# Patient Record
Sex: Male | Born: 1950 | Race: White | Hispanic: No | Marital: Married | Smoking: Former smoker
Health system: Southern US, Community
[De-identification: ages and names within clinical notes are randomized; demographics above are authoritative.]

## PROBLEM LIST (undated history)

## (undated) ENCOUNTER — Emergency Department (HOSPITAL_BASED_OUTPATIENT_CLINIC_OR_DEPARTMENT_OTHER): Discharge status: Against Medical Advice | Payer: Medicare Other | Source: Home / Self Care

## (undated) DIAGNOSIS — K219 Gastro-esophageal reflux disease without esophagitis: Secondary | ICD-10-CM

## (undated) DIAGNOSIS — J40 Bronchitis, not specified as acute or chronic: Secondary | ICD-10-CM

## (undated) DIAGNOSIS — Z9289 Personal history of other medical treatment: Secondary | ICD-10-CM

## (undated) DIAGNOSIS — D8489 Other immunodeficiencies: Secondary | ICD-10-CM

## (undated) DIAGNOSIS — F32A Depression, unspecified: Secondary | ICD-10-CM

## (undated) DIAGNOSIS — I341 Nonrheumatic mitral (valve) prolapse: Secondary | ICD-10-CM

## (undated) DIAGNOSIS — I200 Unstable angina: Secondary | ICD-10-CM

## (undated) DIAGNOSIS — I499 Cardiac arrhythmia, unspecified: Secondary | ICD-10-CM

## (undated) DIAGNOSIS — K449 Diaphragmatic hernia without obstruction or gangrene: Secondary | ICD-10-CM

## (undated) DIAGNOSIS — Z96649 Presence of unspecified artificial hip joint: Secondary | ICD-10-CM

## (undated) DIAGNOSIS — F419 Anxiety disorder, unspecified: Secondary | ICD-10-CM

## (undated) DIAGNOSIS — J189 Pneumonia, unspecified organism: Secondary | ICD-10-CM

## (undated) DIAGNOSIS — M1990 Unspecified osteoarthritis, unspecified site: Secondary | ICD-10-CM

## (undated) HISTORY — DX: Personal history of other medical treatment: Z92.89

## (undated) HISTORY — DX: Presence of unspecified artificial hip joint: Z96.649

## (undated) HISTORY — DX: Diaphragmatic hernia without obstruction or gangrene: K44.9

## (undated) HISTORY — PX: PARATHYROIDECTOMY: SHX19

## (undated) HISTORY — DX: Other immunodeficiencies: D84.89

## (undated) HISTORY — DX: Unspecified osteoarthritis, unspecified site: M19.90

## (undated) HISTORY — PX: JOINT REPLACEMENT: SHX530

## (undated) HISTORY — DX: Pneumonia, unspecified organism: J18.9

## (undated) HISTORY — DX: Hypercalcemia: E83.52

## (undated) HISTORY — DX: Gastro-esophageal reflux disease without esophagitis: K21.9

## (undated) HISTORY — PX: NASAL SEPTUM SURGERY: SHX37

## (undated) HISTORY — PX: HERNIA REPAIR: SHX51

## (undated) HISTORY — DX: Depression, unspecified: F32.A

## (undated) HISTORY — PX: CATARACT EXTRACTION: SUR2

## (undated) HISTORY — DX: Nonrheumatic mitral (valve) prolapse: I34.1

## (undated) HISTORY — DX: Anxiety disorder, unspecified: F41.9

---

## 1982-07-05 HISTORY — PX: APPENDECTOMY: SHX54

## 1994-07-05 HISTORY — PX: MASTOIDECTOMY: SHX711

## 2000-07-05 HISTORY — PX: CHOLECYSTECTOMY: SHX55

## 2002-07-05 DIAGNOSIS — Z96649 Presence of unspecified artificial hip joint: Secondary | ICD-10-CM

## 2002-07-05 HISTORY — DX: Presence of unspecified artificial hip joint: Z96.649

## 2006-04-18 ENCOUNTER — Encounter: Admission: RE | Admit: 2006-04-18 | Discharge: 2006-07-17 | Payer: Self-pay | Admitting: Emergency Medicine

## 2007-05-08 ENCOUNTER — Encounter: Payer: Self-pay | Admitting: Pulmonary Disease

## 2007-05-24 ENCOUNTER — Inpatient Hospital Stay (HOSPITAL_COMMUNITY): Admission: EM | Admit: 2007-05-24 | Discharge: 2007-05-26 | Payer: Self-pay | Admitting: Internal Medicine

## 2007-05-24 ENCOUNTER — Encounter: Payer: Self-pay | Admitting: Internal Medicine

## 2007-06-07 ENCOUNTER — Ambulatory Visit: Payer: Self-pay | Admitting: Pulmonary Disease

## 2007-07-06 DIAGNOSIS — J189 Pneumonia, unspecified organism: Secondary | ICD-10-CM

## 2007-07-06 HISTORY — DX: Pneumonia, unspecified organism: J18.9

## 2007-07-13 DIAGNOSIS — K219 Gastro-esophageal reflux disease without esophagitis: Secondary | ICD-10-CM | POA: Insufficient documentation

## 2007-07-14 ENCOUNTER — Ambulatory Visit: Payer: Self-pay | Admitting: Pulmonary Disease

## 2007-10-24 ENCOUNTER — Encounter (INDEPENDENT_AMBULATORY_CARE_PROVIDER_SITE_OTHER): Payer: Self-pay | Admitting: *Deleted

## 2007-10-24 ENCOUNTER — Ambulatory Visit: Payer: Self-pay | Admitting: Internal Medicine

## 2007-10-24 DIAGNOSIS — Z9089 Acquired absence of other organs: Secondary | ICD-10-CM

## 2007-10-24 DIAGNOSIS — Z87891 Personal history of nicotine dependence: Secondary | ICD-10-CM | POA: Insufficient documentation

## 2007-10-24 DIAGNOSIS — J189 Pneumonia, unspecified organism: Secondary | ICD-10-CM

## 2007-10-24 DIAGNOSIS — Z87898 Personal history of other specified conditions: Secondary | ICD-10-CM | POA: Insufficient documentation

## 2007-10-24 DIAGNOSIS — F988 Other specified behavioral and emotional disorders with onset usually occurring in childhood and adolescence: Secondary | ICD-10-CM

## 2007-10-24 DIAGNOSIS — E785 Hyperlipidemia, unspecified: Secondary | ICD-10-CM

## 2007-10-24 DIAGNOSIS — F329 Major depressive disorder, single episode, unspecified: Secondary | ICD-10-CM

## 2007-10-24 DIAGNOSIS — Z96649 Presence of unspecified artificial hip joint: Secondary | ICD-10-CM | POA: Insufficient documentation

## 2007-10-24 DIAGNOSIS — J329 Chronic sinusitis, unspecified: Secondary | ICD-10-CM | POA: Insufficient documentation

## 2007-10-24 DIAGNOSIS — D179 Benign lipomatous neoplasm, unspecified: Secondary | ICD-10-CM | POA: Insufficient documentation

## 2007-10-24 DIAGNOSIS — S72009A Fracture of unspecified part of neck of unspecified femur, initial encounter for closed fracture: Secondary | ICD-10-CM | POA: Insufficient documentation

## 2007-10-24 DIAGNOSIS — H6690 Otitis media, unspecified, unspecified ear: Secondary | ICD-10-CM | POA: Insufficient documentation

## 2007-10-24 LAB — CONVERTED CEMR LAB
ALT: 37 units/L (ref 0–53)
AST: 26 units/L (ref 0–37)
Alkaline Phosphatase: 100 units/L (ref 39–117)
BUN: 13 mg/dL (ref 6–23)
Basophils Absolute: 0 10*3/uL (ref 0.0–0.1)
CO2: 26 meq/L (ref 19–32)
Glucose, Bld: 91 mg/dL (ref 70–99)
IgA: 140 mg/dL (ref 68–378)
MCHC: 32.8 g/dL (ref 30.0–36.0)
Monocytes Relative: 9 % (ref 3–12)
Potassium: 4.8 meq/L (ref 3.5–5.3)
RDW: 13.4 % (ref 11.5–15.5)
Total Bilirubin: 0.5 mg/dL (ref 0.3–1.2)
WBC: 5.4 10*3/uL (ref 4.0–10.5)

## 2008-01-02 ENCOUNTER — Ambulatory Visit: Payer: Self-pay | Admitting: Internal Medicine

## 2008-09-12 ENCOUNTER — Emergency Department (HOSPITAL_COMMUNITY): Admission: EM | Admit: 2008-09-12 | Discharge: 2008-09-12 | Payer: Self-pay | Admitting: Family Medicine

## 2008-11-01 ENCOUNTER — Encounter: Admission: RE | Admit: 2008-11-01 | Discharge: 2008-11-01 | Payer: Self-pay | Admitting: Family Medicine

## 2009-05-15 ENCOUNTER — Encounter: Admission: RE | Admit: 2009-05-15 | Discharge: 2009-05-15 | Payer: Self-pay | Admitting: Family Medicine

## 2010-09-21 ENCOUNTER — Emergency Department (HOSPITAL_COMMUNITY)
Admission: EM | Admit: 2010-09-21 | Discharge: 2010-09-21 | Disposition: A | Discharge status: Home / Self Care | Payer: Self-pay | Attending: Emergency Medicine | Admitting: Emergency Medicine

## 2010-09-21 ENCOUNTER — Emergency Department (HOSPITAL_COMMUNITY): Payer: Self-pay

## 2010-09-21 DIAGNOSIS — R51 Headache: Secondary | ICD-10-CM | POA: Insufficient documentation

## 2010-09-21 DIAGNOSIS — F329 Major depressive disorder, single episode, unspecified: Secondary | ICD-10-CM | POA: Insufficient documentation

## 2010-09-21 DIAGNOSIS — Z9889 Other specified postprocedural states: Secondary | ICD-10-CM | POA: Insufficient documentation

## 2010-09-21 DIAGNOSIS — Z79899 Other long term (current) drug therapy: Secondary | ICD-10-CM | POA: Insufficient documentation

## 2010-09-21 DIAGNOSIS — R209 Unspecified disturbances of skin sensation: Secondary | ICD-10-CM | POA: Insufficient documentation

## 2010-09-21 DIAGNOSIS — E789 Disorder of lipoprotein metabolism, unspecified: Secondary | ICD-10-CM | POA: Insufficient documentation

## 2010-09-21 DIAGNOSIS — F3289 Other specified depressive episodes: Secondary | ICD-10-CM | POA: Insufficient documentation

## 2010-09-21 DIAGNOSIS — F988 Other specified behavioral and emotional disorders with onset usually occurring in childhood and adolescence: Secondary | ICD-10-CM | POA: Insufficient documentation

## 2010-09-21 DIAGNOSIS — R42 Dizziness and giddiness: Secondary | ICD-10-CM | POA: Insufficient documentation

## 2010-09-21 DIAGNOSIS — Z8546 Personal history of malignant neoplasm of prostate: Secondary | ICD-10-CM | POA: Insufficient documentation

## 2010-09-21 LAB — COMPREHENSIVE METABOLIC PANEL
AST: 22 U/L (ref 0–37)
Albumin: 4 g/dL (ref 3.5–5.2)
CO2: 28 mEq/L (ref 19–32)
Calcium: 11.4 mg/dL — ABNORMAL HIGH (ref 8.4–10.5)
GFR calc Af Amer: >60 mL/min (ref >60)
GFR calc non Af Amer: >60 mL/min (ref >60)
Glucose, Bld: 91 mg/dL (ref 70–99)
Total Bilirubin: 0.7 mg/dL (ref 0.3–1.2)
Total Protein: 6.3 g/dL (ref 6.0–8.3)

## 2010-09-21 LAB — DIFFERENTIAL
Basophils Relative: 0 % (ref 0–1)
Eosinophils Relative: 1 % (ref 0–5)
Lymphocytes Relative: 22 % (ref 12–46)
Lymphs Abs: 1.4 10*3/uL (ref 0.7–4.0)
Monocytes Absolute: 0.5 10*3/uL (ref 0.1–1.0)
Monocytes Relative: 8 % (ref 3–12)

## 2010-09-21 LAB — PHOSPHORUS: Phosphorus: 2.4 mg/dL (ref 2.3–4.6)

## 2010-09-21 LAB — CBC
MCH: 31.2 pg (ref 26.0–34.0)
RBC: 4.68 MIL/uL (ref 4.22–5.81)

## 2010-10-15 LAB — PROTIME-INR
INR: 1 (ref 0.00–1.49)
Prothrombin Time: 12.9 seconds (ref 11.6–15.2)

## 2010-10-15 LAB — POCT URINALYSIS DIP (DEVICE)
Ketones, ur: NEGATIVE mg/dL
Nitrite: NEGATIVE
Protein, ur: 100 mg/dL — AB

## 2010-10-15 LAB — DIFFERENTIAL
Basophils Absolute: 0 10*3/uL (ref 0.0–0.1)
Eosinophils Absolute: 0 10*3/uL (ref 0.0–0.7)
Eosinophils Relative: 0 % (ref 0–5)
Lymphs Abs: 0.5 10*3/uL — ABNORMAL LOW (ref 0.7–4.0)
Monocytes Absolute: 0.2 10*3/uL (ref 0.1–1.0)
Monocytes Relative: 4 % (ref 3–12)
Neutro Abs: 6.3 10*3/uL (ref 1.7–7.7)
Neutrophils Relative %: 89 % — ABNORMAL HIGH (ref 43–77)

## 2010-10-15 LAB — CBC
HCT: 45.6 % (ref 39.0–52.0)
Platelets: 158 10*3/uL (ref 150–400)

## 2010-10-15 LAB — POCT I-STAT, CHEM 8
Calcium, Ion: 1.56 mmol/L — ABNORMAL HIGH (ref 1.12–1.32)
TCO2: 28 mmol/L (ref 0–100)

## 2010-10-15 LAB — COMPREHENSIVE METABOLIC PANEL
ALT: 38 U/L (ref 0–53)
Alkaline Phosphatase: 144 U/L — ABNORMAL HIGH (ref 39–117)
CO2: 30 mEq/L (ref 19–32)
GFR calc non Af Amer: >60 mL/min (ref >60)
Potassium: 4.7 mEq/L (ref 3.5–5.1)
Total Bilirubin: 0.9 mg/dL (ref 0.3–1.2)

## 2010-11-11 ENCOUNTER — Other Ambulatory Visit (HOSPITAL_COMMUNITY): Payer: Self-pay | Admitting: Endocrinology

## 2010-11-11 DIAGNOSIS — E213 Hyperparathyroidism, unspecified: Secondary | ICD-10-CM

## 2010-11-13 ENCOUNTER — Ambulatory Visit (HOSPITAL_COMMUNITY): Payer: Self-pay

## 2010-11-13 ENCOUNTER — Ambulatory Visit (HOSPITAL_COMMUNITY)
Admission: RE | Admit: 2010-11-13 | Discharge: 2010-11-13 | Disposition: A | Discharge status: Home / Self Care | Payer: PRIVATE HEALTH INSURANCE | Source: Ambulatory Visit | Attending: Endocrinology | Admitting: Endocrinology

## 2010-11-13 ENCOUNTER — Ambulatory Visit (HOSPITAL_COMMUNITY): Admission: RE | Admit: 2010-11-13 | Payer: Self-pay | Source: Ambulatory Visit

## 2010-11-13 DIAGNOSIS — E213 Hyperparathyroidism, unspecified: Secondary | ICD-10-CM

## 2010-11-13 MED ORDER — TECHNETIUM TC 99M SESTAMIBI - CARDIOLITE
25.0000 | Freq: Once | INTRAVENOUS | Status: AC | PRN
  Administered 2010-11-13: 25 via INTRAVENOUS

## 2010-11-17 NOTE — H&P (Signed)
NAMELORENZ, [name]               ACCOUNT NO.:  [id number]   MEDICAL RECORD NO.:  [id number]          PATIENT TYPE:  INP   LOCATION:  1318                         FACILITY:  [facility]   PHYSICIAN:  [name], M.D.DATE OF BIRTH:  Nov 04, 1950   DATE OF ADMISSION:  05/24/2007  DATE OF DISCHARGE:                              HISTORY & PHYSICAL   PRIMARY CARE PHYSICIAN:  [name], M.D.   CHIEF COMPLAINT:  Worsening difficulty breathing per primary care  physician, pneumonia, failed outpatient treatment.   HISTORY OF PRESENT ILLNESS:  The patient is a 60 year old white male,  former smoker, with history of pneumonia in the past, also history of  hypercholesterolemia and depression who presents with the above  complaints.  He states that about 11 days ago he developed some URI  symptoms (caught a cold from his grandson.  He has a nonproductive cough  associated which did not improve with over-the-counter cold medication.  He states that he also began having subjective fevers and generalized  malaise/weakness and so six days ago, he saw his primary care physician.  Per Dr. Laurine Blazer, an x-ray was done which was consistent with pneumonia  and the patient was given IM Rocephin and started on oral Levaquin.  [name] states that the cough continued to worsen, still nonproductive  for the most part, but very frequent and keeps him up at night.  Also  his energy level has continued to decrease and he also developed  difficulty breathing.  He went back to see respiratory care on the day  prior to admission.  Per Dr. Laurine Blazer, his clinical exam sounded worse  than on his previous visit and at that time his oxygen saturation on  room air where adequate, in the 90s, she states that she spoke with the  patient about getting admitted but they did not want to come in at that  time.  He was given nebulized bronchodilators and he went home.  Today  the patient work up feeling worse and with  worsening difficulty and  breathing and so was directly admitted to the hospital for further  evaluation and management.  Per Dr. Laurine Blazer, the patient also received a  second dose of IM Rocephin on the day prior to admission when he decided  then he was not ready to be admitted.   The patient denies chest pain, nausea and vomiting, hemoptysis, melena  and, no hematochezia.  He also denies leg swelling.  He admits to some  loose stools since he was discharged on antibiotics.   PAST MEDICAL HISTORY:  1. As above.  2. Question ADHD.  3. GERD/Barrett's.  4. Status post total hip replacement in 2004.   MEDICATIONS:  1. Zocor 40 mg p.o. at bedtime.  2. Prozac 40 mg daily.  3. Prilosec 40 mg daily.  4. Ritalin 10 mg p.o. t.i.d.  The patient states that he was      prescribed this medication but he does not take the Ritalin.  5. Prednisone.  Started on the day prior to admission for one dose.   SOCIAL HISTORY:  He quit tobacco 22 years ago.  Rare alcohol.   FAMILY HISTORY:  His father had an MI in his 4s and he died at age 25  of lung cancer (he was a smoker). His mother had hypertension.   REVIEW OF SYSTEMS:  As per HPI.  Other review of systems negative.   PHYSICAL EXAMINATION:  GENERAL:  The patient is middle-aged white male.  He is acutely ill-appearing, in respiratory distress.  Well-developed,  well-nourished.  VITAL SIGNS:  Oxygen saturation is 97% on room air.  Respiratory rate is  25, blood pressure 116/69, pulse 72, temperature 98.5.  HEENT:  PERRL.  EOMI.  Sclerae nonicteric.  Slightly dry mucous  membranes.  NECK:  Supple.  No adenopathy, no thyromegaly.  No JVD.  LUNGS:  Left-sided rhonchi,  moderate air movement, no wheezes.  CARDIOVASCULAR:  Regular rate and rhythm.  Normal S1 and S2.  No S3  appreciated.  ABDOMEN:  Soft.  Bowel sounds present.  Nontender.  Nondistended.  No  organomegaly, no masses palpable.  EXTREMITIES:  No cyanosis and no edema.   LABORATORY  DATA:  The CBC, chemistries and chest x-ray are pending.   ASSESSMENT/PLAN:  Problem #1.  Pneumonia.  Review of chest x-ray, CBC,  EKG ordered, empiric IV antibiotics.  The patient filled outpatient  antibiotics as discussed above.  Will obtain blood cultures if her  temperature is equal to or greater than 100.4.  Problem #2.  History of depression.  Continue Prozac.  Problem #3. History of hypercholesterolemia.  Continue Zocor.      [name], M.D.  Electronically Signed     ACV/MEDQ  D:  05/24/2007  T:  05/24/2007  Job:  [id number]   cc:   Dr. Johnn Gomez

## 2010-11-17 NOTE — Assessment & Plan Note (Signed)
[facility]                             PULMONARY OFFICE NOTE   NAME:[name], [name]                        MRN:          [id number]  DATE:06/07/2007                            DOB:          [birth year]    [name] is a pleasant 60 year old Caucasian gentleman who is  recovering from a pneumonia.  He reports catching a cold from his  daughter a few weeks ago and developing dyspnea, cough, and fever.  He  had left basilar crackles and expiratory wheezes on May 18, 2007  and received 1 gm of Rocephin in the office.  He was no better in five  days' time.  The chest x-ray showed a left lower lobe infiltrate on  November 13.  He was hospitalized due to this finding and treated with  antibiotics.  He was told that he may have COPD, and this is his main  concern today.  He reports that his energy level has improved but is  only at about 20% of baseline and describes bouts of coughing, mostly  dry but sometimes with minimal white sputum.  He denies wheezing, chest  pain.  He remains dyspneic on minimal exertion.   PAST MEDICAL HISTORY:  1. ADHD.  2. Hiatal hernia.  3. GERD.  4. Barrett's esophagus.  5. Mitral valve prolapse with heart rhythm problems.   PAST SURGICAL HISTORY:  1. Cholecystectomy six years ago.  2. Appendectomy 25 years ago.  3. Mastoidectomy 12 years ago.  4. Total hip replacement in 2004.  5. Screening endoscopy every two years.   ALLERGIES:  None.   SOCIAL HISTORY:  He smoked about a pack and a half per day for 20 years  but quit about 18 years ago.  He is married and lives with his wife.  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   FAMILY HISTORY:  His father died of lung cancer.  Heart disease in  father and mother.   REVIEW OF SYSTEMS:  He reports cough, as above.  Occasional acid,  heartburn, sticking pain in his chest.  A sore throat with occasional  headaches and nasal congestion.  Multiple lipomas all over his body.   PHYSICAL EXAMINATION:  Height 6 feet 1 inch.  Weight 192.6 pounds.  Blood pressure 110/66, temperature 97.8, heart rate 84, oxygen  saturation 96% on room air.  HEENT:  No postnasal drip.  NECK:  Supple.  No JVD.  No lymphadenopathy.  CVS:  S1 and S2.  Normal.  CHEST:  Clear to auscultation.  ABDOMEN:  Soft and nontender.  NEUROLOGIC:  Nonfocal.  EXTREMITIES:  No edema.   CURRENT MEDICATIONS:  1. Prozac 40 mg daily.  2. Zocor 40 mg daily.  3. Prilosec 40 mg daily.  4. Maalox Triple Strength 3 times daily.  5. Ritalin 10 mg 3 tablets daily.  6. Symbicort 160/4.5 2 puffs b.i.d.  7. Combivent 2 puffs b.i.d. p.r.n.  8. Mucinex 2 tablets daily.  9. Albuterol nebs 1-2 daily.   Chest x-ray from  May 24, 2007 shows accentuated peribronchial  markings.  No evidence of pneumonia.  A linear density in the left  retrocardiac region can be sub-segmental atelectasis.   IMPRESSION:  1. Recurring left lower lobe pneumonia.  2. Post pneumonic cough.  3. Question underlying chronic obstructive pulmonary disease in this      ex-smoker.  4. Multiple subcutaneous lipomas.   RECOMMENDATIONS:  1. I have reassured [name] that what he has is likely a post-      bronchitic cough and may last up to eight weeks but most likely      will pass.  Recommended treatment for this is inhaled and/or      systemic steroids.  Given his problems with hiatal hernia and GERD,      we will probably stick to inhaled steroids.  As such, Symbicort      will be continued at the current dose.  He will take Robitussin-DM      for symptomatic relief.  Tussionex apparently has caused problems      with somnolence.  2. He will return for a followup in about six weeks time when lung      function will be estimated by spirometry.  Further recommendations      will followed based on that test.  3. I do not feel the need for further antibiotics.   I would also      suggest good treatment of his reflux symptoms so that we do not      transition from this into a chronic cough.     Oretha Milch, MD  Electronically Signed    RVA/MedQ  DD: 06/07/2007  DT: 06/07/2007  Job #: [id number]   cc:   Emeterio Reeve, MD

## 2010-12-21 ENCOUNTER — Other Ambulatory Visit (INDEPENDENT_AMBULATORY_CARE_PROVIDER_SITE_OTHER): Payer: Self-pay | Admitting: General Surgery

## 2011-01-01 ENCOUNTER — Other Ambulatory Visit (INDEPENDENT_AMBULATORY_CARE_PROVIDER_SITE_OTHER): Payer: Self-pay | Admitting: General Surgery

## 2011-01-18 ENCOUNTER — Encounter (HOSPITAL_COMMUNITY)
Admission: RE | Admit: 2011-01-18 | Discharge: 2011-01-18 | Disposition: A | Discharge status: Home / Self Care | Payer: PRIVATE HEALTH INSURANCE | Source: Ambulatory Visit | Attending: General Surgery | Admitting: General Surgery

## 2011-01-18 ENCOUNTER — Other Ambulatory Visit (HOSPITAL_COMMUNITY): Payer: PRIVATE HEALTH INSURANCE

## 2011-01-18 LAB — CBC
HCT: 44.9 % (ref 39.0–52.0)
Hemoglobin: 16.1 g/dL (ref 13.0–17.0)
MCH: 31.9 pg (ref 26.0–34.0)
MCV: 89.1 fL (ref 78.0–100.0)

## 2011-01-20 ENCOUNTER — Other Ambulatory Visit (INDEPENDENT_AMBULATORY_CARE_PROVIDER_SITE_OTHER): Payer: Self-pay | Admitting: General Surgery

## 2011-01-20 ENCOUNTER — Other Ambulatory Visit (HOSPITAL_COMMUNITY): Payer: PRIVATE HEALTH INSURANCE

## 2011-01-20 ENCOUNTER — Ambulatory Visit (HOSPITAL_COMMUNITY)
Admission: RE | Admit: 2011-01-20 | Discharge: 2011-01-20 | Disposition: A | Discharge status: Home / Self Care | Payer: PRIVATE HEALTH INSURANCE | Source: Ambulatory Visit | Attending: General Surgery | Admitting: General Surgery

## 2011-01-20 DIAGNOSIS — E210 Primary hyperparathyroidism: Secondary | ICD-10-CM

## 2011-01-20 DIAGNOSIS — I059 Rheumatic mitral valve disease, unspecified: Secondary | ICD-10-CM | POA: Insufficient documentation

## 2011-01-20 DIAGNOSIS — F909 Attention-deficit hyperactivity disorder, unspecified type: Secondary | ICD-10-CM | POA: Insufficient documentation

## 2011-01-20 DIAGNOSIS — J449 Chronic obstructive pulmonary disease, unspecified: Secondary | ICD-10-CM | POA: Insufficient documentation

## 2011-01-20 DIAGNOSIS — J4489 Other specified chronic obstructive pulmonary disease: Secondary | ICD-10-CM | POA: Insufficient documentation

## 2011-01-20 DIAGNOSIS — D1739 Benign lipomatous neoplasm of skin and subcutaneous tissue of other sites: Secondary | ICD-10-CM

## 2011-01-20 DIAGNOSIS — D351 Benign neoplasm of parathyroid gland: Secondary | ICD-10-CM

## 2011-01-20 DIAGNOSIS — Z01812 Encounter for preprocedural laboratory examination: Secondary | ICD-10-CM | POA: Insufficient documentation

## 2011-01-20 MED ORDER — TECHNETIUM TC 99M SESTAMIBI - CARDIOLITE
25.0000 | Freq: Once | INTRAVENOUS | Status: AC | PRN
  Administered 2011-01-20: 11:00:00 25 via INTRAVENOUS

## 2011-01-21 NOTE — Op Note (Signed)
[name], [name]               ACCOUNT NO.:  [id number]  MEDICAL RECORD NO.:  [id number]  LOCATION:                                 FACILITY:  PHYSICIAN:  Almond Lint, MD       DATE OF BIRTH:  [birth year]  DATE OF PROCEDURE:  01/20/2011 DATE OF DISCHARGE:                              OPERATIVE REPORT   PREOPERATIVE DIAGNOSES: 1. Primary hyperparathyroidism. 2. Left forearm masses.  POSTOPERATIVE DIAGNOSES: 1. Primary hyperparathyroidism. 2. Left forearm masses.  PROCEDURE:  Minimally invasive parathyroidectomy and excision of left forearm mass was x3.  Mass #1 around 1.5 cm, mass #2 around 1.8 cm, and mass #3 around 3 cm.  SURGEON:  Almond Lint, MD  ASSISTANT:  Ollen Gross. Vernell Morgans, MD  ANESTHESIA:  General and local.  FINDINGS:  Large left inferior parathyroid and 3 arm lipomas.  SPECIMEN:  Parathyroid for frozen section demonstrating a 3.5-gram adenoma and 3 left forearm masses to pathology.  Of note, the #1 is the most distal and #3 is the most proximal.  ESTIMATED BLOOD LOSS:  Minimal.  COMPLICATIONS:  None known.  PROCEDURE:  [name] was identified in the holding area and taken to operating room where he was placed supine on the operating table. General anesthesia was induced.  His arms were tucked and his upper chest and neck were prepped and draped in sterile fashion.  A shoulder roll was placed and his neck was extended.  The incision was made approximately 1.5 cm above the sternal notch.  This was approximately 4 cm in length.  The skin was incised with a #15 blade and the subcutaneous tissues and the platysma were divided with cautery.  The strap muscles were opened in the midline and retracted toward the left with the [employer].  Tonsil was used to separate the alveolar adhesions from the anterior surface of the thyroid.  The parathyroid was immediately seen as it was quite large.  The attachments of this were divided bluntly and then the vessels  going into it were clipped and divided with the Metzenbaum scissors.  The mass was enclosed in a fatty layer.  This was tested with NeoProbe and it was hot.  It was sent for frozen section.  Hemostasis was achieved with the cautery.  The nerve was seen and was undamaged.  The Seprafilm was placed underneath anterior to the thyroid.  The strap muscles were reapproximated with a running 3-0 Vicryl.  The skin was pulled back together with interrupted 3-0 Vicryl to the platysma and the deep dermis and 4-0 Monocryl running subcuticular stitch.  This was then cleaned, dried, and dressed with Benzoin and Steri-Strips.  The frozen section to come back while we were closing and this was positive for an adenoma, so the wound was dressed.   Left forearm was then pulled out where it was tucked and this was clipped, prepped, and draped in a sterile fashion.  Each of these forearm masses with the skin overlying this was infiltrated with local. Longitudinal incisions were made over the masses.  The most dorsal mass was approached first.  The skin was incised and the lipoma was immediately visualized.  This  was taken off the surrounding tissues with cautery and with Metzenbaum scissors.  This was passed off the table. The most distal one was done next.  Again, a longitudinal incision made overlying it and this was dissected from the surrounding tissue with hemostat.  The largest one which was the most proximal was approached last and again, this was incised #15 blade over the mass.  This was taken off the attachments bluntly, with cautery, and with Metzenbaum scissors.  There were two other small lipomas palpable in the arm from this incision and so these were taken as well with the hemostat.  Hemostasis was achieved in all incisions with cautery.  These were irrigated.  The wounds were then all closed with interrupted 3-0 Vicryl deep dermal sutures and 4-0 Monocryl running subcuticular suture.  The  incisions were cleaned, dried, and dressed with Dermabond.  The patient was awakened from anesthesia and taken to PACU in stable condition.  Needle, sponge, and instrument counts were correct.     Almond Lint, MD     FB/MEDQ  D:  01/20/2011  T:  01/21/2011  Job:  [id number]  Electronically Signed by Almond Lint MD on 01/21/2011 11:17:00 AM

## 2011-01-24 ENCOUNTER — Emergency Department (HOSPITAL_COMMUNITY)
Admission: EM | Admit: 2011-01-24 | Discharge: 2011-01-24 | Disposition: A | Discharge status: Home / Self Care | Payer: PRIVATE HEALTH INSURANCE | Attending: Emergency Medicine | Admitting: Emergency Medicine

## 2011-01-24 DIAGNOSIS — Z79899 Other long term (current) drug therapy: Secondary | ICD-10-CM | POA: Insufficient documentation

## 2011-01-24 DIAGNOSIS — Z87891 Personal history of nicotine dependence: Secondary | ICD-10-CM | POA: Insufficient documentation

## 2011-01-24 DIAGNOSIS — Z9889 Other specified postprocedural states: Secondary | ICD-10-CM | POA: Insufficient documentation

## 2011-01-24 DIAGNOSIS — E7800 Pure hypercholesterolemia, unspecified: Secondary | ICD-10-CM | POA: Insufficient documentation

## 2011-01-24 DIAGNOSIS — F988 Other specified behavioral and emotional disorders with onset usually occurring in childhood and adolescence: Secondary | ICD-10-CM | POA: Insufficient documentation

## 2011-01-24 DIAGNOSIS — F411 Generalized anxiety disorder: Secondary | ICD-10-CM | POA: Insufficient documentation

## 2011-01-24 LAB — BASIC METABOLIC PANEL
BUN: 16 mg/dL (ref 6–23)
CO2: 29 mEq/L (ref 19–32)
Calcium: 10.2 mg/dL (ref 8.4–10.5)
Creatinine, Ser: 1.03 mg/dL (ref 0.50–1.35)
GFR calc non Af Amer: >60 mL/min (ref >60)
Glucose, Bld: 100 mg/dL — ABNORMAL HIGH (ref 70–99)

## 2011-02-18 ENCOUNTER — Encounter (INDEPENDENT_AMBULATORY_CARE_PROVIDER_SITE_OTHER): Payer: Self-pay | Admitting: General Surgery

## 2011-02-19 ENCOUNTER — Encounter (INDEPENDENT_AMBULATORY_CARE_PROVIDER_SITE_OTHER): Payer: Self-pay | Admitting: General Surgery

## 2011-02-19 ENCOUNTER — Ambulatory Visit (INDEPENDENT_AMBULATORY_CARE_PROVIDER_SITE_OTHER): Payer: PRIVATE HEALTH INSURANCE | Admitting: General Surgery

## 2011-02-19 DIAGNOSIS — E210 Primary hyperparathyroidism: Secondary | ICD-10-CM

## 2011-02-19 NOTE — Assessment & Plan Note (Signed)
Doing well post op. Still with some fatigue. Follow up labs. Will see prn if labs are abnormal

## 2011-02-19 NOTE — Progress Notes (Signed)
HPI Pt doing OK after minimally invasive parathyroidectomy.  He was very fatigued for a few weeks, but that is getting better.  He was having difficulty sleeping.  He does occasionally feel some numbness around his mouth and some hints of cramps in his feet and hands.  He has not had overt cramping.  He has been taking his tums.  He also had excision of 3 lipomas on his L forearm.  He feels some soreness still on the ones over the brachioradialis, but the one on the flexor surface has been non tender.  He has had no signs of infection or fevers/chills.    Physical Exam: Alert and oriented times 3.  No acute distress. Neck is supple, no lymphadenopathy.  Incision is well healed.  There is no erythema or drainage. L forearm has incisions in appropriate stage of healing.  He has no signs of infection.  Hyperparathyroidism, primary, s/p MI parathyroidectomy 7/18, 3.1 gm adenoma Doing well post op. Still with some fatigue. Follow up labs. Will see prn if labs are abnormal

## 2011-02-22 NOTE — Progress Notes (Signed)
Quick Note:  Labs OK, does not need to increase calcium. Please get follow up labs in 6-8 weeks PTH/Ca ______

## 2011-02-24 ENCOUNTER — Telehealth (INDEPENDENT_AMBULATORY_CARE_PROVIDER_SITE_OTHER): Payer: Self-pay | Admitting: General Surgery

## 2011-04-13 LAB — CULTURE, BLOOD (ROUTINE X 2): Culture: NO GROWTH

## 2011-04-13 LAB — COMPREHENSIVE METABOLIC PANEL
Albumin: 3.3 — ABNORMAL LOW
BUN: 13
Calcium: 9.7
Creatinine, Ser: 1.09
Glucose, Bld: 97
Potassium: 4.3
Total Protein: 5.6 — ABNORMAL LOW

## 2011-04-13 LAB — URINALYSIS, ROUTINE W REFLEX MICROSCOPIC
Hgb urine dipstick: NEGATIVE
Ketones, ur: NEGATIVE
Protein, ur: NEGATIVE
Urobilinogen, UA: 0.2

## 2011-04-13 LAB — DIFFERENTIAL
Basophils Relative: 1
Lymphocytes Relative: 19
Lymphs Abs: 1.1
Monocytes Absolute: 0.4
Monocytes Relative: 7
Neutro Abs: 4.3
Neutrophils Relative %: 70

## 2011-04-13 LAB — CBC
HCT: 37 — ABNORMAL LOW
HCT: 40.3
Hemoglobin: 13.2
Hemoglobin: 13.9
MCHC: 34.4
MCV: 88.9
Platelets: 238
RDW: 12.3
RDW: 12.3

## 2011-04-13 LAB — BASIC METABOLIC PANEL
BUN: 11
CO2: 28
Chloride: 105
GFR calc non Af Amer: >60
Glucose, Bld: 90
Potassium: 4
Sodium: 138

## 2011-04-13 LAB — CARDIAC PANEL(CRET KIN+CKTOT+MB+TROPI)
Relative Index: 0.6
Troponin I: <0.01

## 2011-04-13 LAB — URINE CULTURE
Colony Count: NO GROWTH
Culture: NO GROWTH
Special Requests: POSITIVE

## 2011-05-31 ENCOUNTER — Encounter (HOSPITAL_COMMUNITY): Payer: Self-pay | Admitting: Pharmacy Technician

## 2011-06-01 ENCOUNTER — Other Ambulatory Visit: Payer: Self-pay | Admitting: Orthopedic Surgery

## 2011-06-04 ENCOUNTER — Inpatient Hospital Stay (HOSPITAL_COMMUNITY): Admission: RE | Admit: 2011-06-04 | Discharge: 2011-06-04 | Payer: PRIVATE HEALTH INSURANCE | Source: Ambulatory Visit

## 2011-06-04 NOTE — Pre-Procedure Instructions (Signed)
20 CELESTINO ACKERMAN  06/04/2011   Your procedure is scheduled on: Mon,Dec 10@ 1025  Report to [facility] Short Stay Center at 0825 AM.  Call this number if you have problems the morning of surgery: [phone]   Remember:   Do not eat food:After Midnight.  May have clear liquids: up to 4 Hours before arrival(.nothing after 4:25am)  Clear liquids include soda, tea, black coffee, apple or grape juice, broth.  Take these medicines the morning of surgery with A SIP OF WATER: Prozac,Ritalin,Omeprazole   Do not wear jewelry, make-up or nail polish.  Do not wear lotions, powders, or perfumes. You may wear deodorant.  Do not shave 48 hours prior to surgery.  Do not bring valuables to the hospital.  Contacts, dentures or bridgework may not be worn into surgery.  Leave suitcase in the car. After surgery it may be brought to your room.  For patients admitted to the hospital, checkout time is 11:00 AM the day of discharge.   Patients discharged the day of surgery will not be allowed to drive home.  Name and phone number of your driver:   Special Instructions: CHG Shower Use Special Wash: 1/2 bottle night before surgery and 1/2 bottle morning of surgery.   Please read over the following fact sheets that you were given: Pain Booklet, Coughing and Deep Breathing, Blood Transfusion Information, MRSA Information and Surgical Site Infection Prevention

## 2011-06-08 ENCOUNTER — Encounter (HOSPITAL_COMMUNITY): Payer: Self-pay

## 2011-06-08 ENCOUNTER — Encounter (HOSPITAL_COMMUNITY)
Admission: RE | Admit: 2011-06-08 | Discharge: 2011-06-08 | Disposition: A | Discharge status: Home / Self Care | Payer: PRIVATE HEALTH INSURANCE | Source: Ambulatory Visit | Attending: Orthopedic Surgery | Admitting: Orthopedic Surgery

## 2011-06-08 HISTORY — DX: Cardiac arrhythmia, unspecified: I49.9

## 2011-06-08 HISTORY — DX: Bronchitis, not specified as acute or chronic: J40

## 2011-06-08 LAB — CBC
HCT: 47.1 % (ref 39.0–52.0)
Hemoglobin: 16.1 g/dL (ref 13.0–17.0)
MCH: 31.2 pg (ref 26.0–34.0)
MCV: 91.3 fL (ref 78.0–100.0)
RBC: 5.16 MIL/uL (ref 4.22–5.81)

## 2011-06-08 LAB — PROTIME-INR: INR: 1 (ref 0.00–1.49)

## 2011-06-08 LAB — DIFFERENTIAL
Eosinophils Absolute: 0.2 10*3/uL (ref 0.0–0.7)
Lymphs Abs: 1.5 10*3/uL (ref 0.7–4.0)
Monocytes Absolute: 0.5 10*3/uL (ref 0.1–1.0)
Monocytes Relative: 9 % (ref 3–12)
Neutrophils Relative %: 62 % (ref 43–77)

## 2011-06-08 LAB — URINALYSIS, ROUTINE W REFLEX MICROSCOPIC
Ketones, ur: NEGATIVE mg/dL
Leukocytes, UA: NEGATIVE
Nitrite: NEGATIVE
Protein, ur: NEGATIVE mg/dL
pH: 6 (ref 5.0–8.0)

## 2011-06-08 LAB — BASIC METABOLIC PANEL
CO2: 28 mEq/L (ref 19–32)
Chloride: 104 mEq/L (ref 96–112)
Glucose, Bld: 99 mg/dL (ref 70–99)
Potassium: 4.6 mEq/L (ref 3.5–5.1)
Sodium: 141 mEq/L (ref 135–145)

## 2011-06-08 LAB — ABO/RH: ABO/RH(D): AB POS

## 2011-06-08 LAB — TYPE AND SCREEN

## 2011-06-08 NOTE — Progress Notes (Signed)
Contacted Dr. Wadie Lessen office [phone], left voicemail for Olegario Messier to verify EKG from July 2012 ok to use prior to surgery.

## 2011-06-08 NOTE — Progress Notes (Signed)
Ok to use recent EKG per karen with dr. Wadie Lessen office.

## 2011-06-08 NOTE — Pre-Procedure Instructions (Signed)
20 [name]  06/08/2011   Your procedure is scheduled on:  Mon. June 14, 2011  Report to [facility] Short Stay Center at 0825am AM.  Call this number if you have problems the morning of surgery: [phone]   Remember:   Do not eat food:After Midnight.  May have clear liquids: up to 4 Hours before arrival.(nothing after 4:24am)  Clear liquids include soda, tea, black coffee, apple or grape juice, broth.  Take these medicines the morning of surgery with A SIP OF WATER: prozac, ritalin, omeprazole   Do not wear jewelry, make-up or nail polish.  Do not wear lotions, powders, or perfumes. You may wear deodorant.  Do not shave 48 hours prior to surgery.  Do not bring valuables to the hospital.  Contacts, dentures or bridgework may not be worn into surgery.  Leave suitcase in the car. After surgery it may be brought to your room.  For patients admitted to the hospital, checkout time is 11:00 AM the day of discharge.   Patients discharged the day of surgery will not be allowed to drive home.  Name and phone number of your driver: Askia Hazelip [phone]  Special Instructions: CHG Shower Use Special Wash: 1/2 bottle night before surgery and 1/2 bottle morning of surgery.   Please read over the following fact sheets that you were given: Pain Booklet, Coughing and Deep Breathing, Blood Transfusion Information, MRSA Information and Surgical Site Infection Prevention

## 2011-06-10 NOTE — H&P (Signed)
HISTORY OF PRESENT ILLNESS:  A 60 year old gentleman here for progressive left hip pain.  In 2003 he had what sounds like a femoral neck fracture they attempted to pin, that failed and they converted him to a bipolar implant.  Over the last 3 years he has had progressive groin pain and limp.  It is associated with standing or shooting pool for more than an hour.  It never occurs at rest or when he is seated.  No problems with the wound.  No fevers, no chills.  When he has the pain it is severe, sharp, stabbing and it feels like somebody has stuck an ice pick in his groin.  Up until a few years ago he does not report any of these issues.  Review of systems reviewed thoroughly.  No prior problems with the hip before the fracture in 2003.  He is not a diabetic, no elevated cholesterol.  He has had cataract surgery in 2010.  Has had some insomnia since 2012.  Does not take any blood thinners.  He does not smoke, does not use alcohol.  He is married, he is here with his wife. He lives with 4 other people.  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  All other systems are negative as related to the chief complaint.  OBJECTIVE:  Internal rotation of his left hip reproduces minimal pain.  He has a very mild left-sided limp and reports right now it is not hurting that much.  His surgical wound is well healed.  Plain radiographs show what appears to be a cemented, beaded Omnifit stem from [employer] with a bipolar head. The bipolar head is pretty much down to bone-on-bone superiorly and medially. Normal pulses to the foot. Normal sensation to the foot.  Generally he is a well-nourished, well-developed 60 year old gentleman seated on the exam table.  No apparent distress.  No shortness of breath.  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  PLAN:  To confirm this, I would like to have him seen by Dr. Regino Schultze for an anesthetic  injection in a few hours, after he has a chance to get it aggravated.  If that does take his pain away, I would agree with him and that it should be converted to a total hip.  We will discuss this further after he has the injection.

## 2011-06-13 ENCOUNTER — Encounter (HOSPITAL_COMMUNITY): Payer: Self-pay | Admitting: Orthopedic Surgery

## 2011-06-13 DIAGNOSIS — T8484XA Pain due to internal orthopedic prosthetic devices, implants and grafts, initial encounter: Secondary | ICD-10-CM

## 2011-06-13 MED ORDER — CHLORHEXIDINE GLUCONATE 4 % EX LIQD: 60.0000 mL | Freq: Once | CUTANEOUS | Status: DC

## 2011-06-13 MED ORDER — CEFAZOLIN SODIUM 1-5 GM-% IV SOLN: 1.0000 g | INTRAVENOUS | Status: DC

## 2011-06-14 ENCOUNTER — Encounter (HOSPITAL_COMMUNITY)
Admission: RE | Disposition: A | Discharge status: Skilled Nursing Facility | Payer: Self-pay | Source: Ambulatory Visit | Attending: Orthopedic Surgery

## 2011-06-14 ENCOUNTER — Encounter (HOSPITAL_COMMUNITY): Payer: Self-pay | Admitting: Anesthesiology

## 2011-06-14 ENCOUNTER — Inpatient Hospital Stay (HOSPITAL_COMMUNITY): Payer: PRIVATE HEALTH INSURANCE

## 2011-06-14 ENCOUNTER — Encounter (HOSPITAL_COMMUNITY): Payer: Self-pay | Admitting: *Deleted

## 2011-06-14 ENCOUNTER — Inpatient Hospital Stay (HOSPITAL_COMMUNITY)
Admission: RE | Admit: 2011-06-14 | Discharge: 2011-06-18 | DRG: 468 | Disposition: A | Discharge status: Skilled Nursing Facility | Payer: PRIVATE HEALTH INSURANCE | Source: Ambulatory Visit | Attending: Orthopedic Surgery | Admitting: Orthopedic Surgery

## 2011-06-14 ENCOUNTER — Inpatient Hospital Stay (HOSPITAL_COMMUNITY): Payer: PRIVATE HEALTH INSURANCE | Admitting: Anesthesiology

## 2011-06-14 DIAGNOSIS — T8484XA Pain due to internal orthopedic prosthetic devices, implants and grafts, initial encounter: Secondary | ICD-10-CM

## 2011-06-14 DIAGNOSIS — T8489XA Other specified complication of internal orthopedic prosthetic devices, implants and grafts, initial encounter: Principal | ICD-10-CM | POA: Diagnosis present

## 2011-06-14 DIAGNOSIS — K219 Gastro-esophageal reflux disease without esophagitis: Secondary | ICD-10-CM | POA: Diagnosis present

## 2011-06-14 DIAGNOSIS — Z96649 Presence of unspecified artificial hip joint: Secondary | ICD-10-CM

## 2011-06-14 DIAGNOSIS — Z01812 Encounter for preprocedural laboratory examination: Secondary | ICD-10-CM

## 2011-06-14 DIAGNOSIS — K449 Diaphragmatic hernia without obstruction or gangrene: Secondary | ICD-10-CM | POA: Diagnosis present

## 2011-06-14 HISTORY — PX: TOTAL HIP REVISION: SHX763

## 2011-06-14 SURGERY — TOTAL HIP REVISION
Anesthesia: General | Site: Hip | Laterality: Left | Wound class: Clean

## 2011-06-14 MED ORDER — SODIUM CHLORIDE 0.9 % IR SOLN
Status: DC | PRN
  Administered 2011-06-14: 1000 mL

## 2011-06-14 MED ORDER — CEFAZOLIN SODIUM-DEXTROSE 2-3 GM-% IV SOLR
INTRAVENOUS | Status: AC
  Administered 2011-06-14: 2 g via INTRAVENOUS
  Filled 2011-06-14: qty 50

## 2011-06-14 MED ORDER — ROCURONIUM BROMIDE 100 MG/10ML IV SOLN
INTRAVENOUS | Status: DC | PRN
  Administered 2011-06-14: 50 mg via INTRAVENOUS

## 2011-06-14 MED ORDER — METHYLPHENIDATE HCL 10 MG PO TABS
10.0000 mg | ORAL_TABLET | Freq: Three times a day (TID) | ORAL | Status: DC
  Administered 2011-06-14 – 2011-06-18 (×12): 10 mg via ORAL
  Filled 2011-06-14 (×13): qty 2

## 2011-06-14 MED ORDER — PANTOPRAZOLE SODIUM 40 MG PO TBEC
40.0000 mg | DELAYED_RELEASE_TABLET | Freq: Every day | ORAL | Status: DC
  Administered 2011-06-14 – 2011-06-18 (×5): 40 mg via ORAL
  Filled 2011-06-14 (×5): qty 1

## 2011-06-14 MED ORDER — ONDANSETRON HCL 4 MG PO TABS
4.0000 mg | ORAL_TABLET | Freq: Four times a day (QID) | ORAL | Status: DC | PRN
  Administered 2011-06-17: 4 mg via ORAL
  Filled 2011-06-14: qty 1

## 2011-06-14 MED ORDER — WARFARIN VIDEO: Freq: Once | Status: DC

## 2011-06-14 MED ORDER — FLEET ENEMA 7-19 GM/118ML RE ENEM: 1.0000 | ENEMA | Freq: Once | RECTAL | Status: AC | PRN

## 2011-06-14 MED ORDER — METOCLOPRAMIDE HCL 5 MG/ML IJ SOLN
5.0000 mg | Freq: Three times a day (TID) | INTRAMUSCULAR | Status: DC | PRN
  Filled 2011-06-14: qty 2

## 2011-06-14 MED ORDER — NEOSTIGMINE METHYLSULFATE 1 MG/ML IJ SOLN
INTRAMUSCULAR | Status: DC | PRN
  Administered 2011-06-14: 5 mg via INTRAVENOUS

## 2011-06-14 MED ORDER — GLYCOPYRROLATE 0.2 MG/ML IJ SOLN
INTRAMUSCULAR | Status: DC | PRN
  Administered 2011-06-14: .8 mg via INTRAVENOUS

## 2011-06-14 MED ORDER — ACETAMINOPHEN 650 MG RE SUPP: 650.0000 mg | Freq: Four times a day (QID) | RECTAL | Status: DC | PRN

## 2011-06-14 MED ORDER — ONDANSETRON HCL 4 MG/2ML IJ SOLN: 4.0000 mg | Freq: Four times a day (QID) | INTRAMUSCULAR | Status: DC | PRN

## 2011-06-14 MED ORDER — FLUOXETINE HCL 40 MG PO CAPS: 40.0000 mg | ORAL_CAPSULE | Freq: Every day | ORAL | Status: DC

## 2011-06-14 MED ORDER — BISACODYL 5 MG PO TBEC
5.0000 mg | DELAYED_RELEASE_TABLET | Freq: Every day | ORAL | Status: DC | PRN
  Administered 2011-06-16: 5 mg via ORAL
  Filled 2011-06-14: qty 1

## 2011-06-14 MED ORDER — MEPERIDINE HCL 25 MG/ML IJ SOLN: 6.2500 mg | INTRAMUSCULAR | Status: DC | PRN

## 2011-06-14 MED ORDER — MIDAZOLAM HCL 5 MG/5ML IJ SOLN
INTRAMUSCULAR | Status: DC | PRN
  Administered 2011-06-14: 2 mg via INTRAVENOUS

## 2011-06-14 MED ORDER — HYDROMORPHONE HCL PF 1 MG/ML IJ SOLN
0.5000 mg | INTRAMUSCULAR | Status: DC | PRN
  Administered 2011-06-14 – 2011-06-15 (×2): 1 mg via INTRAVENOUS
  Filled 2011-06-14 (×2): qty 1

## 2011-06-14 MED ORDER — PATIENT'S GUIDE TO USING COUMADIN BOOK
Freq: Once | Status: DC
  Filled 2011-06-14: qty 1

## 2011-06-14 MED ORDER — FLUOXETINE HCL 20 MG PO CAPS
40.0000 mg | ORAL_CAPSULE | Freq: Every day | ORAL | Status: DC
  Administered 2011-06-14 – 2011-06-18 (×5): 40 mg via ORAL
  Filled 2011-06-14 (×5): qty 2

## 2011-06-14 MED ORDER — LACTATED RINGERS IV SOLN
INTRAVENOUS | Status: DC | PRN
  Administered 2011-06-14 (×2): via INTRAVENOUS

## 2011-06-14 MED ORDER — PHENOL 1.4 % MT LIQD
1.0000 | OROMUCOSAL | Status: DC | PRN
  Filled 2011-06-14: qty 177

## 2011-06-14 MED ORDER — HYDROMORPHONE HCL PF 1 MG/ML IJ SOLN
INTRAMUSCULAR | Status: AC
  Administered 2011-06-14: 0.5 mg via INTRAVENOUS
  Filled 2011-06-14: qty 1

## 2011-06-14 MED ORDER — MENTHOL 3 MG MT LOZG: 1.0000 | LOZENGE | OROMUCOSAL | Status: DC | PRN

## 2011-06-14 MED ORDER — CEFAZOLIN SODIUM-DEXTROSE 2-3 GM-% IV SOLR: 2.0000 g | INTRAVENOUS | Status: DC

## 2011-06-14 MED ORDER — ACETAMINOPHEN 325 MG PO TABS: 650.0000 mg | ORAL_TABLET | Freq: Four times a day (QID) | ORAL | Status: DC | PRN

## 2011-06-14 MED ORDER — DIPHENHYDRAMINE HCL 12.5 MG/5ML PO ELIX
12.5000 mg | ORAL_SOLUTION | ORAL | Status: DC | PRN
  Administered 2011-06-15 (×3): 25 mg via ORAL
  Filled 2011-06-14 (×3): qty 10

## 2011-06-14 MED ORDER — FENTANYL CITRATE 0.05 MG/ML IJ SOLN
INTRAMUSCULAR | Status: DC | PRN
  Administered 2011-06-14: 100 ug via INTRAVENOUS
  Administered 2011-06-14 (×2): 50 ug via INTRAVENOUS

## 2011-06-14 MED ORDER — MAGNESIUM HYDROXIDE 400 MG/5ML PO SUSP
30.0000 mL | Freq: Every day | ORAL | Status: DC | PRN
  Administered 2011-06-17: 30 mL via ORAL

## 2011-06-14 MED ORDER — HYDROCODONE-ACETAMINOPHEN 5-325 MG PO TABS
1.0000 | ORAL_TABLET | ORAL | Status: DC | PRN
  Administered 2011-06-15 – 2011-06-18 (×10): 2 via ORAL
  Filled 2011-06-14 (×10): qty 2

## 2011-06-14 MED ORDER — WARFARIN SODIUM 7.5 MG PO TABS
7.5000 mg | ORAL_TABLET | Freq: Once | ORAL | Status: AC
  Administered 2011-06-14: 7.5 mg via ORAL
  Filled 2011-06-14: qty 1

## 2011-06-14 MED ORDER — HYDROMORPHONE HCL PF 1 MG/ML IJ SOLN
0.2500 mg | INTRAMUSCULAR | Status: DC | PRN
  Administered 2011-06-14 (×3): 0.5 mg via INTRAVENOUS

## 2011-06-14 MED ORDER — OXYCODONE HCL 5 MG PO TABS
5.0000 mg | ORAL_TABLET | ORAL | Status: DC | PRN
  Administered 2011-06-14 – 2011-06-15 (×4): 10 mg via ORAL
  Administered 2011-06-15: 5 mg via ORAL
  Administered 2011-06-15 – 2011-06-17 (×5): 10 mg via ORAL
  Filled 2011-06-14 (×4): qty 2
  Filled 2011-06-14: qty 1
  Filled 2011-06-14 (×5): qty 2

## 2011-06-14 MED ORDER — ONDANSETRON HCL 4 MG/2ML IJ SOLN
INTRAMUSCULAR | Status: DC | PRN
  Administered 2011-06-14: 4 mg via INTRAVENOUS

## 2011-06-14 MED ORDER — PHENYLEPHRINE HCL 10 MG/ML IJ SOLN
INTRAMUSCULAR | Status: DC | PRN
  Administered 2011-06-14: 40 ug via INTRAVENOUS
  Administered 2011-06-14: 80 ug via INTRAVENOUS
  Administered 2011-06-14: 40 ug via INTRAVENOUS

## 2011-06-14 MED ORDER — PROMETHAZINE HCL 25 MG/ML IJ SOLN: 6.2500 mg | INTRAMUSCULAR | Status: DC | PRN

## 2011-06-14 MED ORDER — DEXTROSE-NACL 5-0.45 % IV SOLN: INTRAVENOUS | Status: DC

## 2011-06-14 MED ORDER — SIMVASTATIN 20 MG PO TABS
20.0000 mg | ORAL_TABLET | Freq: Every day | ORAL | Status: DC
  Administered 2011-06-14 – 2011-06-17 (×3): 20 mg via ORAL
  Filled 2011-06-14 (×6): qty 1

## 2011-06-14 MED ORDER — BUPIVACAINE-EPINEPHRINE 0.5% -1:200000 IJ SOLN
INTRAMUSCULAR | Status: DC | PRN
  Administered 2011-06-14: 20 mL

## 2011-06-14 MED ORDER — ALUM & MAG HYDROXIDE-SIMETH 200-200-20 MG/5ML PO SUSP
30.0000 mL | ORAL | Status: DC | PRN
  Administered 2011-06-15: 30 mL via ORAL
  Filled 2011-06-14 (×2): qty 30

## 2011-06-14 MED ORDER — PROPOFOL 10 MG/ML IV EMUL
INTRAVENOUS | Status: DC | PRN
  Administered 2011-06-14: 200 mg via INTRAVENOUS

## 2011-06-14 MED ORDER — KCL IN DEXTROSE-NACL 20-5-0.45 MEQ/L-%-% IV SOLN
INTRAVENOUS | Status: DC
  Administered 2011-06-14 – 2011-06-15 (×2): via INTRAVENOUS
  Filled 2011-06-14 (×14): qty 1000

## 2011-06-14 MED ORDER — METOCLOPRAMIDE HCL 10 MG PO TABS: 5.0000 mg | ORAL_TABLET | Freq: Three times a day (TID) | ORAL | Status: DC | PRN

## 2011-06-14 MED ORDER — ENOXAPARIN SODIUM 40 MG/0.4ML ~~LOC~~ SOLN
40.0000 mg | SUBCUTANEOUS | Status: DC
  Administered 2011-06-15 – 2011-06-18 (×4): 40 mg via SUBCUTANEOUS
  Filled 2011-06-14 (×6): qty 0.4

## 2011-06-14 MED ORDER — LACTATED RINGERS IV SOLN
INTRAVENOUS | Status: DC
  Administered 2011-06-14: 10:00:00 via INTRAVENOUS

## 2011-06-14 MED ORDER — ZOLPIDEM TARTRATE 5 MG PO TABS
5.0000 mg | ORAL_TABLET | Freq: Every evening | ORAL | Status: DC | PRN
  Administered 2011-06-16 – 2011-06-17 (×2): 5 mg via ORAL
  Filled 2011-06-14 (×2): qty 1

## 2011-06-14 SURGICAL SUPPLY — 68 items
BOWL SMART MIX CTS (DISPOSABLE) IMPLANT
BRUSH FEMORAL CANAL (MISCELLANEOUS) IMPLANT
Biolox ceramic head 36mm ×2 IMPLANT
Biolox taper adaptor (Hips) ×2 IMPLANT
CLOTH BEACON ORANGE TIMEOUT ST (SAFETY) ×2 IMPLANT
COVER SURGICAL LIGHT HANDLE (MISCELLANEOUS) ×2 IMPLANT
DRAPE C-ARM 42X72 X-RAY (DRAPES) IMPLANT
DRAPE ORTHO SPLIT 77X108 STRL (DRAPES) ×1
DRAPE PROXIMA HALF (DRAPES) ×4 IMPLANT
DRAPE SURG ORHT 6 SPLT 77X108 (DRAPES) ×1 IMPLANT
DRAPE U-SHAPE 47X51 STRL (DRAPES) ×2 IMPLANT
DRILL BIT 7/64X5 (BIT) ×2 IMPLANT
DRSG MEPILEX BORDER 4X12 (GAUZE/BANDAGES/DRESSINGS) ×4 IMPLANT
DRSG MEPILEX BORDER 4X8 (GAUZE/BANDAGES/DRESSINGS) IMPLANT
DURAPREP 26ML APPLICATOR (WOUND CARE) ×2 IMPLANT
ELECT BLADE 4.0 EZ CLEAN MEGAD (MISCELLANEOUS) ×2
ELECT BLADE 6.5 EXT (BLADE) ×2 IMPLANT
ELECT REM PT RETURN 9FT ADLT (ELECTROSURGICAL) ×2
ELECTRODE BLDE 4.0 EZ CLN MEGD (MISCELLANEOUS) ×1 IMPLANT
ELECTRODE REM PT RTRN 9FT ADLT (ELECTROSURGICAL) ×1 IMPLANT
EVACUATOR 1/8 PVC DRAIN (DRAIN) IMPLANT
GAUZE XEROFORM 5X9 LF (GAUZE/BANDAGES/DRESSINGS) IMPLANT
GLOVE BIO SURGEON STRL SZ7 (GLOVE) ×4 IMPLANT
GLOVE BIO SURGEON STRL SZ7.5 (GLOVE) ×2 IMPLANT
GLOVE BIOGEL PI IND STRL 6.5 (GLOVE) ×1 IMPLANT
GLOVE BIOGEL PI IND STRL 7.0 (GLOVE) ×1 IMPLANT
GLOVE BIOGEL PI IND STRL 8 (GLOVE) ×1 IMPLANT
GLOVE BIOGEL PI INDICATOR 6.5 (GLOVE) ×1
GLOVE BIOGEL PI INDICATOR 7.0 (GLOVE) ×1
GLOVE BIOGEL PI INDICATOR 8 (GLOVE) ×1
GLOVE EUDERMIC 7 POWDERFREE (GLOVE) ×2 IMPLANT
GOWN PREVENTION PLUS XLARGE (GOWN DISPOSABLE) ×2 IMPLANT
GOWN STRL NON-REIN LRG LVL3 (GOWN DISPOSABLE) ×6 IMPLANT
HANDPIECE INTERPULSE COAX TIP (DISPOSABLE) ×1
HEEL PROTECTOR  874200 (MISCELLANEOUS)
HEEL PROTECTOR 874200 (MISCELLANEOUS) IMPLANT
HOOD PEEL AWAY FACE SHEILD DIS (HOOD) ×4 IMPLANT
KIT BASIN OR (CUSTOM PROCEDURE TRAY) ×2 IMPLANT
KIT ROOM TURNOVER OR (KITS) ×2 IMPLANT
MANIFOLD NEPTUNE II (INSTRUMENTS) ×2 IMPLANT
NEEDLE 22X1 1/2 (OR ONLY) (NEEDLE) ×2 IMPLANT
NOZZLE PRISM 8.5MM (MISCELLANEOUS) IMPLANT
NS IRRIG 1000ML POUR BTL (IV SOLUTION) ×4 IMPLANT
PACK TOTAL JOINT (CUSTOM PROCEDURE TRAY) ×2 IMPLANT
PAD ARMBOARD 7.5X6 YLW CONV (MISCELLANEOUS) ×2 IMPLANT
PASSER SUT SWANSON 36MM LOOP (INSTRUMENTS) ×2 IMPLANT
PRESSURIZER FEMORAL UNIV (MISCELLANEOUS) IMPLANT
RINGLOC = Acetabular Shell  54mm size 24 (Orthopedic Implant) ×2 IMPLANT
Ringloc XL Acetabular liner (Hips) ×2 IMPLANT
SET HNDPC FAN SPRY TIP SCT (DISPOSABLE) ×1 IMPLANT
SLEEVE SURGEON STRL (DRAPES) IMPLANT
SPONGE GAUZE 4X4 12PLY (GAUZE/BANDAGES/DRESSINGS) IMPLANT
SPONGE LAP 18X18 X RAY DECT (DISPOSABLE) ×2 IMPLANT
STAPLER VISISTAT 35W (STAPLE) ×2 IMPLANT
SUT ETHIBOND 2 V 37 (SUTURE) ×2 IMPLANT
SUT ETHILON 3 0 FSL (SUTURE) ×2 IMPLANT
SUT VIC AB 0 CTB1 27 (SUTURE) ×2 IMPLANT
SUT VIC AB 1 CTX 36 (SUTURE) ×1
SUT VIC AB 1 CTX36XBRD ANBCTR (SUTURE) ×1 IMPLANT
SUT VIC AB 2-0 CTB1 (SUTURE) ×2 IMPLANT
SYR 20ML ECCENTRIC (SYRINGE) IMPLANT
SYR CONTROL 10ML LL (SYRINGE) ×2 IMPLANT
TOWEL OR 17X24 6PK STRL BLUE (TOWEL DISPOSABLE) ×2 IMPLANT
TOWEL OR 17X26 10 PK STRL BLUE (TOWEL DISPOSABLE) ×2 IMPLANT
TOWER CARTRIDGE SMART MIX (DISPOSABLE) IMPLANT
TRAY FOLEY CATH 14FR (SET/KITS/TRAYS/PACK) ×2 IMPLANT
TUBE ANAEROBIC SPECIMEN COL (MISCELLANEOUS) IMPLANT
WATER STERILE IRR 1000ML POUR (IV SOLUTION) ×6 IMPLANT

## 2011-06-14 NOTE — Transfer of Care (Signed)
Immediate Anesthesia Transfer of Care Note  Patient: [name]  Procedure(s) Performed:  TOTAL HIP REVISION - Left Total Hip Revision  Patient Location: PACU  Anesthesia Type: General  Level of Consciousness: awake, alert  and oriented  Airway & Oxygen Therapy: Patient Spontanous Breathing and Patient connected to nasal cannula oxygen  Post-op Assessment: Report given to PACU RN, Post -op Vital signs reviewed and stable and Patient moving all extremities X 4  Post vital signs: Reviewed and stable  Complications: No apparent anesthesia complications

## 2011-06-14 NOTE — Interval H&P Note (Signed)
History and Physical Interval Note:  06/14/2011 9:48 AM  [name]  has presented today for surgery, with the diagnosis of PAINFUL LEFT HEMI HIP  The various methods of treatment have been discussed with the patient and family. After consideration of risks, benefits and other options for treatment, the patient has consented to  Procedure(s): TOTAL HIP REVISION as a surgical intervention .  The patients' history has been reviewed, patient examined, no change in status, stable for surgery.  I have reviewed the patients' chart and labs.  Questions were answered to the patient's satisfaction.     Nestor Lewandowsky

## 2011-06-14 NOTE — Op Note (Signed)
OPERATIVE REPORT    DATE OF PROCEDURE:  06/14/2011       PREOPERATIVE DIAGNOSIS:  PAINFUL LEFT HEMI HIP                                                       Estimated Body mass index is 25.88 kg/(m^2) as calculated from the following:   Height as of 07/14/07: 6\' 0" (1.829 m).   Weight as of 01/02/08: 190 lb 12.8 oz(86.546 kg).     POSTOPERATIVE DIAGNOSIS:  PAINFUL LEFT HEMI HIP                                                           PROCEDURE:  Revision L total hip arthroplasty using a 54 mm Biomet  Cup,10-degree polyethylene liner index superior  and posterior, a -3 36 mm ceramic head   SURGEON: Gretna Bergin J    ASSISTANT:   Mauricia Area, PA-C  (present throughout entire procedure and necessary for timely completion of the procedure)   ANESTHESIA:  General  BLOOD LOSS: 500cc FLUID REPLACEMENT: 1600crystalloid DRAINS: Foley Catheter URINE OUTPUT: 300 COMPLICATIONS:  None   INDICATIONS FOR PROCEDURE: A 60 y.o. year-old @GENDER @  With  PAINFUL LEFT HEMI HIP   for 2 years, x-rays show metal-on-bone arthritic changes. Despite conservative measures with observation, anti-inflammatory medicine, narcotics, use of a cane, has severe unremitting pain and can ambulate only a few blocks before resting.  Patient desires elective Revision L total hip arthroplasty to decrease pain and increase function. The risks, benefits, and alternatives were discussed at length including but not limited to the risks of infection, bleeding, nerve injury, stiffness, blood clots, the need for revision surgery, cardiopulmonary complications, among others, and they were willing to proceed.y have been discussed. Questions answered.     PROCEDURE IN DETAIL: The patient was identified by armband,  received preoperative IV antibiotics in the holding area at [facility], taken to the operating room , appropriate anesthetic monitors  were attached and general endotracheal anesthesia induced. Foley catheter  was inserted. He was rolled into the R lateral decubitus position and fixed there with a Stulberg Mark II pelvic clamp and the L lower extremity was then prepped and draped  in the usual sterile fashion from the ankle to the hemipelvis. A time-out  procedure was performed. The skin along the lateral hip and thigh  infiltrated with 10 mL of 0.5% Marcaine and epinephrine solution. We  then made a posterolateral approach to the hip. With a #10 blade, 23 cm  incision through skin and subcutaneous tissue down to the level of the  IT band. Small bleeders were identified and cauterized. IT band cut in  line with skin incision exposing the greater trochanter. A Cobra retractor was placed between the gluteus minimus and the superior hip joint capsule, and a spiked Cobra under the inferior hip joint capsule.  The posterior  capsule was then developed into an acetabular-based flap from Posterior Superior off of the acetabulum out over the femoral neck and back posterior inferior to the acetabular rim. This flap was tagged with two #2 Ethibond sutures and retracted protecting the  sciatic nerve. This exposed the hemihip bipolar ball. The hip was then flexed and internally rotated, dislocating the femoral head and the bipolar head was removed using a punch.  A spiked Cobra was placed in the cotyloid notch and a Hohmann retractor was then used to lever the trunion anteriorly off of the anterior pelvic column.We then removed the peripheral osteophytesfrom the acetabulum. We then reamed the acetabulum up to 53 mm with basket reamers obtaining good coverage in all quadrants, irrigated out with normal  saline solution and hammered into place a 54 mm biomet cup with sup holes in 45  degrees of abduction and about 30 degrees of anteversion as the well fixed stem had only 5 degrees of anteversion. A trial 10-degree liner placed with the  index superior-posterior. A -3 36mm trial head was placed on the well fixed stem. Trial  reduction was then performed and excellent stability was noted with at 90 of flexion with 70 of internal rotation and then full extension with maximal external rotation. The hip could not be dislocated in full extension. The knee could easily flex  to about 130 degrees. We also stretched the abductors at this point,  because of the preexisting adductor contractures. All trial components  were then removed. The acetabulum was irrigated out with normal saline  solution. At this point, a +0 36 mm ceramic head was  hammered on the stem. The hip was reduced. We checked our stability  one more time and found to be excellent. The wound was once again  thoroughly irrigated out with normal saline solution pulse lavage. The  capsular flap and short external rotators were repaired back to the  intertrochanteric crest through drill holes with a #2 Ethibond suture.  The IT band was closed with running 1 Vicryl suture. The subcutaneous  tissue with 0 and 2-0 undyed Vicryl suture and the skin with running  interlocking 3-0 nylon suture. Dressing of Xeroform and Mepilex was  then applied. The patient was then unclamped, rolled supine, awaken extubated and taken to recovery room without difficulty in stable condition.   Roseann Kees J 06/14/2011, 11:48 AM

## 2011-06-14 NOTE — Progress Notes (Signed)
ANTICOAGULATION CONSULT NOTE - Initial Consult  Pharmacy Consult for Coumadin Indication: DVT prophylaxis s/p hip revision  Allergies  Allergen Reactions  . Ciprofloxacin   . Ciprofloxacin Hcl Other (See Comments)    Burning in ear when drops were used      Vital Signs: Temp: 98.1 F (36.7 C) (12/10 1431) Temp src: Oral (12/10 1431) BP: 98/61 mmHg (12/10 1431) Pulse Rate: 85  (12/10 1431)  Labs: No results found for this basename: HGB:2,HCT:3,PLT:3,APTT:3,LABPROT:3,INR:3,HEPARINUNFRC:3,CREATININE:3,CKTOTAL:3,CKMB:3,TROPONINI:3 in the last 72 hours The CrCl is unknown because both a height and weight (above a minimum accepted value) are required for this calculation.  Medical History: Past Medical History  Diagnosis Date  . Pneumonia 2009  . 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 Mitral valve prolapse     does not see cardiologist for. last stress test 2003  . Hypercalcemia   . Arthritis   . GERD (gastroesophageal reflux disease)   . Hiatal hernia   . Lipoma     left forearm (3)  . Dysrhythmia     ":Due to MVP"  . Bronchitis     history of  . Tumor cells, benign     lung, left side    Medications:  Scheduled:    . ceFAZolin      . enoxaparin  40 mg Subcutaneous Q24H  . FLUoxetine  40 mg Oral Daily  . methylphenidate  10 mg Oral TID  . pantoprazole  40 mg Oral Q1200  . simvastatin  20 mg Oral QHS  . DISCONTD: ceFAZolin (ANCEF) IV  1 g Intravenous 60 min Pre-Op  . DISCONTD: ceFAZolin (ANCEF) IV  2 g Intravenous 60 min Pre-Op  . DISCONTD: chlorhexidine  60 mL Topical Once  . DISCONTD: chlorhexidine  60 mL Topical Once  . DISCONTD: FLUoxetine  40 mg Oral Daily   Infusions:    . dextrose 5 % and 0.45 % NaCl with KCl 20 mEq/L    . DISCONTD: dextrose 5 % and 0.45% NaCl    . DISCONTD: lactated ringers 50 mL/hr at 06/14/11 9604    Assessment: 60 year old s/p hip revision.  Beginning Coumadin for DVTprophylaxis  Goal of Therapy:  INR 2-3   Plan:    1) Coumadin 7.5 mg po x 1 dose tonight 2) Daily PT/INR 3) Coumadin education  Elwin Sleight 06/14/2011,3:19 PM

## 2011-06-14 NOTE — Preoperative (Signed)
Beta Blockers   Reason not to administer Beta Blockers:Pt does not take B Blockers 

## 2011-06-14 NOTE — Anesthesia Procedure Notes (Addendum)
Procedure Name: Intubation Date/Time: 06/14/2011 10:27 AM Performed by: Rossie Muskrat Pre-anesthesia Checklist: Patient identified, Timeout performed, Emergency Drugs available, Suction available and Patient being monitored Patient Re-evaluated:Patient Re-evaluated prior to inductionOxygen Delivery Method: Circle System Utilized Preoxygenation: Pre-oxygenation with 100% oxygen Intubation Type: IV induction Ventilation: Mask ventilation without difficulty Laryngoscope Size: Miller and 2 Grade View: Grade II Tube type: Oral Tube size: 7.5 mm Number of attempts: 3 (unsuccessful intubation attempts by CRNA & MD w Hyacinth Meeker 2 (grade 2 view); blind intubation successful by Dr Jacklynn Bue on 1st attempt (3rd attempt total)) Airway Equipment and Method: stylet Placement Confirmation: positive ETCO2,  CO2 detector and breath sounds checked- equal and bilateral Secured at: 21 cm Tube secured with: Tape Dental Injury: Teeth and Oropharynx as per pre-operative assessment  Difficulty Due To: Difficulty was anticipated, Difficult Airway- due to reduced neck mobility, Difficult Airway- due to limited oral opening and Difficult Airway- due to anterior larynx

## 2011-06-14 NOTE — Anesthesia Preprocedure Evaluation (Signed)
Anesthesia Evaluation  Patient identified by MRN, date of birth, ID band Patient awake    Reviewed: Allergy & Precautions, H&P , NPO status   History of Anesthesia Complications Negative for: history of anesthetic complications  Airway Mallampati: II TM Distance: <3 FB Neck ROM: Full  Mouth opening: Limited Mouth Opening  Dental  (+) Teeth Intact, Caps and Dental Advisory Given   Pulmonary neg pulmonary ROS, pneumonia ,  clear to auscultation        Cardiovascular + dysrhythmias Regular Tachycardia    Neuro/Psych PSYCHIATRIC DISORDERS  Neuromuscular disease    GI/Hepatic Neg liver ROS, hiatal hernia, GERD-  ,  Endo/Other    Renal/GU negative Renal ROS     Musculoskeletal   Abdominal (+) obese,   Peds  Hematology   Anesthesia Other Findings   Reproductive/Obstetrics                           Anesthesia Physical Anesthesia Plan  ASA: II  Anesthesia Plan: General   Post-op Pain Management:    Induction: Intravenous  Airway Management Planned: Oral ETT  Additional Equipment:   Intra-op Plan:   Post-operative Plan: Extubation in OR  Informed Consent:   Dental advisory given  Plan Discussed with: CRNA and Surgeon  Anesthesia Plan Comments:         Anesthesia Quick Evaluation

## 2011-06-14 NOTE — Anesthesia Postprocedure Evaluation (Signed)
  Anesthesia Post-op Note  Patient: [name]  Procedure(s) Performed:  TOTAL HIP REVISION - Left Acetabular  Hip Revision  Patient Location: PACU  Anesthesia Type: General and GA combined with regional for post-op pain  Level of Consciousness: alert   Airway and Oxygen Therapy: Patient Spontanous Breathing  Post-op Pain: mild  Post-op Assessment: Post-op Vital signs reviewed  Post-op Vital Signs: stable  Complications: No apparent anesthesia complications

## 2011-06-15 LAB — BASIC METABOLIC PANEL
BUN: 11 mg/dL (ref 6–23)
Calcium: 7.7 mg/dL — ABNORMAL LOW (ref 8.4–10.5)
Creatinine, Ser: 1.08 mg/dL (ref 0.50–1.35)
GFR calc Af Amer: 84 mL/min — ABNORMAL LOW (ref >90)

## 2011-06-15 LAB — CBC
HCT: 35.3 % — ABNORMAL LOW (ref 39.0–52.0)
MCH: 31.5 pg (ref 26.0–34.0)
MCV: 90.3 fL (ref 78.0–100.0)
Platelets: 161 10*3/uL (ref 150–400)
RDW: 12 % (ref 11.5–15.5)

## 2011-06-15 MED ORDER — WARFARIN SODIUM 7.5 MG PO TABS
7.5000 mg | ORAL_TABLET | Freq: Once | ORAL | Status: AC
  Administered 2011-06-15: 7.5 mg via ORAL
  Filled 2011-06-15: qty 1

## 2011-06-15 MED ORDER — HYDROXYZINE HCL 25 MG PO TABS
25.0000 mg | ORAL_TABLET | Freq: Three times a day (TID) | ORAL | Status: DC | PRN
  Administered 2011-06-15: 25 mg via ORAL
  Filled 2011-06-15: qty 1

## 2011-06-15 NOTE — Progress Notes (Signed)
Physical Therapy Evaluation Patient Details Name: [name] MRN: [id number] DOB: Jun 11, 1951 Today's Date: 06/15/2011  Problem List:  Patient Active Problem List  Diagnoses  . LIPOMAS, MULTIPLE  . HYPERLIPIDEMIA  . DEPRESSION  . ATTENTION DEFICIT DISORDER, ADULT  . OTITIS MEDIA  . SINUSITIS, CHRONIC  . PNEUMONIA, RECURRENT  . G E R D  . HIP FRACTURE, LEFT  . GENITAL HERPES, HX OF  . TOBACCO USE, QUIT  . HIP REPLACEMENT, TOTAL, HX OF  . Other Acquired Absence of Organ  . Hyperparathyroidism, primary, s/p MI parathyroidectomy 7/18, 3.1 gm adenoma  . 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 Pneumonia 2009  . 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 Mitral valve prolapse     does not see cardiologist for. last stress test 2003  . Hypercalcemia   . Arthritis   . GERD (gastroesophageal reflux disease)   . Hiatal hernia   . Lipoma     left forearm (3)  . Dysrhythmia     ":Due to MVP"  . Bronchitis     history of  . Tumor cells, benign     lung, left side   Past Surgical History:  Past Surgical History  Procedure Date  . Cholecystectomy 2002  . Mastoidectomy 1996  . Appendectomy 1984  . Cataract extraction     L and R eye  . Parathyroidectomy   . Nasal septum surgery     sinus surgery    PT Assessment/Plan/Recommendation PT Assessment Clinical Impression Statement: 60 yo male s/p LTHA presents with decreased functional mobility and impairments listed below will benefit from acute PT services to initiate functional mobility training in prep for dc to SNF for rehab PT Recommendation/Assessment: Patient will need skilled PT in the acute care venue PT Problem List: Decreased strength;Decreased range of motion;Decreased mobility;Decreased knowledge of use of DME;Decreased knowledge of precautions;Pain PT Therapy Diagnosis : Difficulty walking;Abnormality of gait;Acute pain PT Plan PT  Frequency: 7X/week PT Treatment/Interventions: DME instruction;Gait training;Stair training;Functional mobility training;Therapeutic activities;Therapeutic exercise;Patient/family education PT Recommendation Recommendations for Other Services: OT consult Follow Up Recommendations: Skilled nursing facility Equipment Recommended: Defer to next venue PT Goals  Acute Rehab PT Goals PT Goal Formulation: With patient Time For Goal Achievement: 7 days Pt will go Supine/Side to Sit: with supervision PT Goal: Supine/Side to Sit - Progress: Progressing toward goal Pt will go Sit to Supine/Side: with supervision PT Goal: Sit to Supine/Side - Progress: Progressing toward goal Pt will go Sit to Stand: with supervision PT Goal: Sit to Stand - Progress: Progressing toward goal Pt will go Stand to Sit: with supervision PT Goal: Stand to Sit - Progress: Progressing toward goal Pt will Ambulate: >150 feet;with supervision;with rolling walker (with correct post hip prec) PT Goal: Ambulate - Progress: Progressing toward goal Pt will Perform Home Exercise Program: Independently PT Goal: Perform Home Exercise Program - Progress: Progressing toward goal  PT Evaluation Precautions/Restrictions  Precautions Precautions: Posterior Hip Restrictions Weight Bearing Restrictions: Yes LLE Weight Bearing: Weight bearing as tolerated Prior Functioning  Home Living Lives With: Spouse Receives Help From: Family Type of Home: House Home Layout:  (plans to dc to SNF for rehab) Prior Function Level of Independence: Independent with homemaking with ambulation Cognition Cognition Arousal/Alertness: Awake/alert Overall Cognitive Status: Appears within functional limits for tasks assessed Orientation Level: Oriented X4 Sensation/Coordination Sensation Light Touch: Appears Intact Coordination Gross Motor Movements are Fluid  and Coordinated: Yes Fine Motor Movements are Fluid and Coordinated: Not  tested Extremity Assessment RUE Assessment RUE Assessment: Within Functional Limits LUE Assessment LUE Assessment: Within Functional Limits RLE Assessment RLE Assessment: Within Functional Limits LLE Assessment LLE Assessment: Exceptions to [facility] LLE Strength LLE Overall Strength Comments: Grossly decr AROM adn strength, limited by pain postop Mobility (including Balance) Bed Mobility Bed Mobility: Yes Supine to Sit: 4: Min assist;HOB flat;With rails Supine to Sit Details (indicate cue type and reason): cues for technique, prepositioning, post hip prec Sitting - Scoot to Edge of Bed: 4: Min assist (guard assist to maintain post hip prec) Sitting - Scoot to Edge of Bed Details (indicate cue type and reason): guard assist to maintain post hip prec Transfers Transfers: Yes Sit to Stand: 4: Min assist;From bed;With upper extremity assist Sit to Stand Details (indicate cue type and reason): cues for post hip prec Stand to Sit: 4: Min assist;With upper extremity assist;To chair/3-in-1 Stand to Sit Details: cues for post hip prec Ambulation/Gait Ambulation/Gait: Yes Ambulation/Gait Assistance: 4: Min assist (second person for help with IV and to push chair) Ambulation/Gait Assistance Details (indicate cue type and reason): cues for sequence, and to maintain post hipprec with turns Ambulation Distance (Feet): 50 Feet Assistive device: Rolling walker Gait Pattern: Step-to pattern    Exercise  Total Joint Exercises Ankle Circles/Pumps: AROM;Both;10 reps;Supine Quad Sets: AROM;Left;10 reps;Supine Gluteal Sets: AROM;Both;10 reps;Supine Towel Squeeze: AROM;Left;10 reps;Supine Heel Slides: AROM;Left;10 reps;Supine Hip ABduction/ADduction: AAROM;Left;10 reps;Supine End of Session PT - End of Session Equipment Utilized During Treatment: Gait belt Activity Tolerance: Patient tolerated treatment well Patient left: in chair;with call bell in reach;with family/visitor present Nurse  Communication: Mobility status for transfers;Mobility status for ambulation General Behavior During Session: [facility] for tasks performed Cognition: [facility] for tasks performed  Van Clines [facility], [place] [phone]  06/15/2011, 4:33 PM

## 2011-06-15 NOTE — Progress Notes (Signed)
Patient ID: [name], male   DOB: 03/07/1951, 60 y.o.   MRN: [id number] PATIENT ID: [name]  MRN: [id number]  DOB/AGE:  1951/02/28 / 60 y.o.  1 Day Post-Op Procedure(s) (LRB): TOTAL HIP REVISION (Left)    PROGRESS NOTE Subjective: Patient is alert, oriented,noNausea, no Vomiting, yes passing gas, no Bowel Movement. Taking PO well. Denies SOB, Chest or Calf Pain. Using Incentive Spirometer, PAS in place. Ambulate today WBAT Patient reports pain as 6 on 0-10 scale  .    Objective: Vital signs in last 24 hours: Filed Vitals:   06/14/11 1408 06/14/11 1431 06/14/11 2208 06/15/11 0528  BP: 102/63 98/61 118/62 91/53  Pulse: 80 85 85 86  Temp: 98.4 F (36.9 C) 98.1 F (36.7 C) 99.9 F (37.7 C) 99.2 F (37.3 C)  TempSrc:  Oral    Resp: 11 18 18 18   SpO2: 100% 100% 100% 99%      Intake/Output from previous day: I/O last 3 completed shifts: In: 4100 [P.O.:600; I.V.:3500] Out: 1350 [Urine:1025; Other:250; Blood:75]   Intake/Output this shift:     LABORATORY DATA:  Basename 06/15/11 0530  WBC 9.9  HGB 12.3*  HCT 35.3*  PLT 161  NA --  K --  CL --  CO2 --  BUN --  CREATININE --  GLUCOSE --  GLUCAP --  INR 1.16  CALCIUM --    Examination: Neurologically intact ABD soft Neurovascular intact Sensation intact distally Intact pulses distally Dorsiflexion/Plantar flexion intact Incision: soaked x 3 No cellulitis present Compartment soft} XR AP&Lat of hip shows well placed\fixed THA  Assessment:   1 Day Post-Op Procedure(s) (LRB): TOTAL HIP REVISION (Left)  Plan: PT/OT WBAT, THA  posterior precautions Change dressing today DVT Prophylaxis: Lovenox\Coumadin bridge, monitor INR 1.5-2.0 target  DISCHARGE PLAN: Skilled Nursing Facility/Rehab  DISCHARGE NEEDS: HHPT, HHRN, CPM, Walker and 3-in-1 comode seat

## 2011-06-15 NOTE — Progress Notes (Signed)
ANTICOAGULATION CONSULT NOTE - Follow Up Consult  Pharmacy Consult for Coumadin Indication: VTE prophylaxis  Allergies  Allergen Reactions  . Ciprofloxacin   . Ciprofloxacin Hcl Other (See Comments)    Burning in ear when drops were used    Patient Measurements:   Adjusted Body Weight:   Vital Signs: Temp: 99.2 F (37.3 C) (12/11 0528) BP: 91/53 mmHg (12/11 0528) Pulse Rate: 86  (12/11 0528)  Labs:  Basename 06/15/11 0530  HGB 12.3*  HCT 35.3*  PLT 161  APTT --  LABPROT 15.0  INR 1.16  HEPARINUNFRC --  CREATININE 1.08  CKTOTAL --  CKMB --  TROPONINI --   The CrCl is unknown because both a height and weight (above a minimum accepted value) are required for this calculation.    Assessment: Patient is a 60 y.o M s/p total hip revision on Coumadin for VTE prophylaxis.  Goal of Therapy:  INR= 1.5-2   Plan:  1) Coumadin 7.5mg  PO x1  Anastassia Noack P 06/15/2011,8:56 AM

## 2011-06-16 LAB — CBC
Hemoglobin: 11.2 g/dL — ABNORMAL LOW (ref 13.0–17.0)
MCH: 31.2 pg (ref 26.0–34.0)
MCHC: 34.5 g/dL (ref 30.0–36.0)
MCV: 90.5 fL (ref 78.0–100.0)
Platelets: 151 10*3/uL (ref 150–400)
RBC: 3.59 MIL/uL — ABNORMAL LOW (ref 4.22–5.81)

## 2011-06-16 LAB — PROTIME-INR: Prothrombin Time: 19.7 seconds — ABNORMAL HIGH (ref 11.6–15.2)

## 2011-06-16 MED ORDER — WARFARIN SODIUM 5 MG PO TABS: 5.0000 mg | ORAL_TABLET | Freq: Every day | ORAL | Status: DC

## 2011-06-16 MED ORDER — OXYCODONE-ACETAMINOPHEN 5-325 MG PO TABS: 1.0000 | ORAL_TABLET | ORAL | Status: AC | PRN

## 2011-06-16 MED ORDER — WARFARIN SODIUM 2.5 MG PO TABS
2.5000 mg | ORAL_TABLET | Freq: Once | ORAL | Status: AC
  Administered 2011-06-16: 2.5 mg via ORAL
  Filled 2011-06-16: qty 1

## 2011-06-16 NOTE — Progress Notes (Signed)
PATIENT ID: [name]  MRN: [id number]  DOB/AGE:  12/04/1950 / 59 y.o.  2 Days Post-Op Procedure(s) (LRB): TOTAL HIP REVISION (Left)    PROGRESS NOTE Subjective: Patient is alert, oriented,noNausea, no Vomiting, yes passing gas, no Bowel Movement. Taking PO well. Denies SOB, Chest or Calf Pain. Using Incentive Spirometer, PAS in place. Ambulating slowly, Itching has improved. Patient reports pain as moderate  .    Objective: Vital signs in last 24 hours: Filed Vitals:   06/15/11 0528 06/15/11 1400 06/15/11 2117 06/16/11 0549  BP: 91/53 98/60 100/57 98/60  Pulse: 86 82 98 91  Temp: 99.2 F (37.3 C) 99.7 F (37.6 C) 99 F (37.2 C) 98.5 F (36.9 C)  TempSrc:  Oral Oral Oral  Resp: 18 18 18 18   SpO2: 99% 96% 91% 94%      Intake/Output from previous day: I/O last 3 completed shifts: In: 2900 [P.O.:2160; I.V.:740] Out: 3350 [Urine:3100; Other:250]   Intake/Output this shift:     LABORATORY DATA:  Basename 06/16/11 0540 06/15/11 0530  WBC 7.5 9.9  HGB 11.2* 12.3*  HCT 32.5* 35.3*  PLT 151 161  NA -- 136  K -- 3.8  CL -- 102  CO2 -- 29  BUN -- 11  CREATININE -- 1.08  GLUCOSE -- 130*  GLUCAP -- --  INR 1.64* 1.16  CALCIUM -- 7.7*    Examination: Neurologically intact ABD soft Neurovascular intact Sensation intact distally Incision: scant drainage} XR AP&Lat of hip shows well placed\fixed THA  Assessment:   2 Days Post-Op Procedure(s) (LRB): TOTAL HIP REVISION (Left) ADDITIONAL DIAGNOSIS:  none  Plan: PT/OT WBAT, THA  posterior precautions  DVT Prophylaxis: Lovenox\Coumadin bridge, monitor INR 1.5-2.0 target  DISCHARGE PLAN: Skilled Nursing Facility/Rehab when bed available  DISCHARGE NEEDS: HHPT, HHRN, Walker and 3-in-1 comode seat

## 2011-06-16 NOTE — Progress Notes (Signed)
Physical Therapy Treatment Patient Details Name: [name] MRN: [id number] DOB: 03-Aug-1950 Today's Date: 06/16/2011  PT Assessment/Plan  PT - Assessment/Plan Comments on Treatment Session: smooth progress with increased distance ambulated, and good recall of 3 post hip prec PT Plan: Discharge plan remains appropriate PT Frequency: 7X/week Follow Up Recommendations: Skilled nursing facility Equipment Recommended: Defer to next venue PT Goals  Acute Rehab PT Goals Time For Goal Achievement: 7 days Pt will go Supine/Side to Sit: with supervision PT Goal: Supine/Side to Sit - Progress: Progressing toward goal Pt will go Sit to Supine/Side: with supervision PT Goal: Sit to Supine/Side - Progress: Progressing toward goal Pt will go Sit to Stand: with supervision PT Goal: Sit to Stand - Progress: Progressing toward goal Pt will go Stand to Sit: with supervision PT Goal: Stand to Sit - Progress: Progressing toward goal Pt will Ambulate: >150 feet;with supervision;with rolling walker PT Goal: Ambulate - Progress: Progressing toward goal Pt will Perform Home Exercise Program: Independently PT Goal: Perform Home Exercise Program - Progress: Progressing toward goal  PT Treatment Precautions/Restrictions  Precautions Precautions: Posterior Hip Restrictions Weight Bearing Restrictions: Yes LLE Weight Bearing: Weight bearing as tolerated Mobility (including Balance) Bed Mobility Supine to Sit: 4: Min assist;HOB flat;With rails Supine to Sit Details (indicate cue type and reason): cues and guard for post hip prec Sitting - Scoot to Edge of Bed: 4: Min assist Sitting - Scoot to Edge of Bed Details (indicate cue type and reason): cues for prec Transfers Sit to Stand: 4: Min assist;From bed;With upper extremity assist Sit to Stand Details (indicate cue type and reason): good maintenance of post hip prec Stand to Sit: 4: Min assist;With upper extremity assist;To chair/3-in-1 Stand to  Sit Details: good prepositioning for hip prec Ambulation/Gait Ambulation/Gait Assistance: 4: Min assist Ambulation/Gait Assistance Details (indicate cue type and reason): cues to self monitor for dizziness, to maintain post hip prec with turns; cues for optimal step length Ambulation Distance (Feet): 80 Feet Assistive device: Rolling walker Gait Pattern: Step-to pattern    Exercise  Total Joint Exercises Ankle Circles/Pumps: AROM;Both;10 reps;Supine Quad Sets: AROM;Left;10 reps;Supine Gluteal Sets: AROM;Both;10 reps;Supine Towel Squeeze: AROM;Left;10 reps;Supine Heel Slides: AROM;Left;10 reps;Supine Hip ABduction/ADduction: AAROM;Left;10 reps;Supine End of Session PT - End of Session Equipment Utilized During Treatment: Gait belt Activity Tolerance: Patient tolerated treatment well Patient left: in chair;with call bell in reach;with family/visitor present Nurse Communication: Mobility status for transfers;Mobility status for ambulation General Behavior During Session: [name] for tasks performed Cognition: [name] for tasks performed  Van Clines [name], [name] [phone] 06/16/2011, 1:32 PM

## 2011-06-16 NOTE — Progress Notes (Signed)
Occupational Therapy Evaluation Patient Details Name: [name] MRN: [id number] DOB: 05/16/1951 Today's Date: 06/16/2011  Problem List:  Patient Active Problem List  Diagnoses  . LIPOMAS, MULTIPLE  . HYPERLIPIDEMIA  . DEPRESSION  . ATTENTION DEFICIT DISORDER, ADULT  . OTITIS MEDIA  . SINUSITIS, CHRONIC  . PNEUMONIA, RECURRENT  . G E R D  . HIP FRACTURE, LEFT  . GENITAL HERPES, HX OF  . TOBACCO USE, QUIT  . HIP REPLACEMENT, TOTAL, HX OF  . Other Acquired Absence of Organ  . Hyperparathyroidism, primary, s/p MI parathyroidectomy 7/18, 3.1 gm adenoma  . 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 Pneumonia 2009  . 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 Mitral valve prolapse     does not see cardiologist for. last stress test 2003  . Hypercalcemia   . Arthritis   . GERD (gastroesophageal reflux disease)   . Hiatal hernia   . Lipoma     left forearm (3)  . Dysrhythmia     ":Due to MVP"  . Bronchitis     history of  . Tumor cells, benign     lung, left side   Past Surgical History:  Past Surgical History  Procedure Date  . Cholecystectomy 2002  . Mastoidectomy 1996  . Appendectomy 1984  . Cataract extraction     L and R eye  . Parathyroidectomy   . Nasal septum surgery     sinus surgery    OT Assessment/Plan/Recommendation OT Assessment Clinical Impression Statement: 60 yo s/p R LE THA with posterior hip precautions that could benefit from skilled OT acutely secondary to decr bed mobility, decr activity tolerance to decr the burden of care for d/c SNF OT Recommendation/Assessment: Patient will need skilled OT in the acute care venue OT Problem List: Decreased strength;Decreased activity tolerance;Impaired balance (sitting and/or standing);Decreased safety awareness;Decreased knowledge of use of DME or AE;Decreased knowledge of precautions;Pain OT Therapy Diagnosis : Generalized  weakness;Acute pain OT Plan OT Frequency: Min 2X/week OT Treatment/Interventions: Self-care/ADL training;DME and/or AE instruction;Therapeutic activities;Patient/family education;Balance training OT Recommendation Follow Up Recommendations: Skilled nursing facility Equipment Recommended: Defer to next venue Individuals Consulted Consulted and Agree with Results and Recommendations: Patient OT Goals Acute Rehab OT Goals OT Goal Formulation: With patient Time For Goal Achievement: 2 weeks ADL Goals Pt Will Transfer to Toilet: with min assist;3-in-1 Pt Will Perform Toileting - Clothing Manipulation: with supervision Pt Will Perform Toileting - Hygiene: with supervision;Sit to stand from 3-in-1/toilet Miscellaneous OT Goals Miscellaneous OT Goal #1: pt will perform bed mobility Min A with bed rails HOb <20 degrees no trapeze bar as precursor to ADLS  OT Evaluation Precautions/Restrictions  Precautions Precautions: Posterior Hip Precaution Comments: pt able to to recall 3 out 3 precautions Restrictions Weight Bearing Restrictions: Yes LLE Weight Bearing: Weight bearing as tolerated Prior Functioning Home Living Lives With: Spouse Receives Help From: Family Type of Home: House Bathroom Shower/Tub: Tub/shower unit;Curtain Bathroom Toilet: Standard Bathroom Accessibility: Yes How Accessible: Accessible via walker;Accessible via wheelchair (use dgter bathroom in hallway) Home Adaptive Equipment: Straight cane Prior Function Level of Independence: Independent with basic ADLs;Independent with homemaking with wheelchair;Independent with homemaking with ambulation;Independent with gait;Independent with transfers Driving: Yes 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) ADL ADL Eating/Feeding: Performed;Set up Where Assessed - Eating/Feeding: Bed level Grooming: Performed;Wash/dry face;Wash/dry hands;Teeth care Where Assessed - Grooming: Supine, head of bed up;Supported Upper Body Bathing:  Performed;Chest;Right  arm;Abdomen;Left arm;Set up Where Assessed - Upper Body Bathing: Supine, head of bed up Lower Body Bathing: Performed;Set up Where Assessed - Lower Body Bathing: Supported;Supine, head of bed up Upper Body Dressing: Performed;Set up Where Assessed - Upper Body Dressing: Supine, head of bed up Lower Body Dressing: Performed;Maximal assistance Where Assessed - Lower Body Dressing: Supine, head of bed up;Supported ADL Comments: Pt completed bath supine in bed and agreeable to toilet transfer next session. Pt using LLE to bridge buttock to doff underwear to just above knees and re don underwear during bath    Vision/Perception  Vision - History Baseline Vision: No visual deficits Patient Visual Report: No change from baseline Cognition Cognition Arousal/Alertness: Awake/alert Overall Cognitive Status: Appears within functional limits for tasks assessed Orientation Level: Oriented X4 Sensation/Coordination Sensation Light Touch: Appears Intact Coordination Gross Motor Movements are Fluid and Coordinated: Yes Fine Motor Movements are Fluid and Coordinated: Yes Extremity Assessment RUE Assessment RUE Assessment: Within Functional Limits LUE Assessment LUE Assessment: Within Functional Limits Mobility  Bed Mobility Bed Mobility: Yes Supine to Sit: 4: Min assist;HOB flat;With rails Supine to Sit Details (indicate cue type and reason): cues and guard for post hip prec Sitting - Scoot to Edge of Bed: 4: Min assist Sitting - Scoot to Edge of Bed Details (indicate cue type and reason): cues for prec Scooting to [facility]: 4: Min assist;With trapeze;With rail (bed in trendelenburg) Scooting to [facility] Details (indicate cue type and reason): Pt using LLE to assist with pushing to [facility] Transfers Transfers: No Sit to Stand: 4: Min assist;From bed;With upper extremity assist Sit to Stand Details (indicate cue type and reason): good maintenance of post hip prec Stand to Sit: 4: Min  assist;With upper extremity assist;To chair/3-in-1 Stand to Sit Details: good prepositioning for hip prec Exercises Total Joint Exercises Ankle Circles/Pumps: AROM;Both;10 reps;Supine Quad Sets: AROM;Left;10 reps;Supine Gluteal Sets: AROM;Both;10 reps;Supine Towel Squeeze: AROM;Left;10 reps;Supine Heel Slides: AROM;Left;10 reps;Supine Hip ABduction/ADduction: AAROM;Left;10 reps;Supine End of Session OT - End of Session Activity Tolerance: Patient tolerated treatment well Patient left: in bed;with call bell in reach General Behavior During Session: [facility] for tasks performed Cognition: [facility] for tasks performed  Next session: toilet transfer   Lucile Shutters 06/16/2011, 3:41 PM  Pager: [phone]

## 2011-06-16 NOTE — Progress Notes (Addendum)
ANTICOAGULATION CONSULT NOTE - Follow Up Consult  Pharmacy Consult for Coumadin Indication: VTE prophylaxis s/p L THA  Allergies  Allergen Reactions  . Ciprofloxacin   . Ciprofloxacin Hcl Other (See Comments)    Burning in ear when drops were used    Patient Measurements:    Vital Signs: Temp: 98.5 F (36.9 C) (12/12 0549) Temp src: Oral (12/12 0549) BP: 98/60 mmHg (12/12 0549) Pulse Rate: 91  (12/12 0549)  Labs:  Basename 06/16/11 0540 06/15/11 0530  HGB 11.2* 12.3*  HCT 32.5* 35.3*  PLT 151 161  APTT -- --  LABPROT 19.7* 15.0  INR 1.64* 1.16  HEPARINUNFRC -- --  CREATININE -- 1.08  CKTOTAL -- --  CKMB -- --  TROPONINI -- --   The CrCl is unknown because both a height and weight (above a minimum accepted value) are required for this calculation.   Assessment: 60 y.o. M on warfarin for VTE px s/p L THA on 12/10 with a therapeutic INR this a.m. (INR 1.64, goal of 1.5-2). No s/sx of bleeding noted.  Goal of Therapy:  INR= 1.5-2   Plan:  1) Warfarin 2.5 mg x 1 dose at 1800 today 2) Will continue to monitor for any signs/symptoms of bleeding and will follow up with PT/INR in the a.m.   Rolley Sims 06/16/2011,12:56 PM

## 2011-06-16 NOTE — Progress Notes (Signed)
Met with patient and wife on 06/15/11 to discuss need for short term SNF. Bed search process discussed. Patient agrees to SNF search but is hopeful to get to [facility]. 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 Bed seach initiated. Anticipate stability/discharge tomorrow if stable per MD. Awaiting bed offers. FL2 placed in shadow chart for MD's review and signature.  Darylene Price, BSW, 06/16/2011 1:51 PM

## 2011-06-16 NOTE — Progress Notes (Signed)
CARE MANAGEMENT NOTE 06/16/2011 Discharge planning. Patient was preoperatively setup with [facility]. Will be going to SNF for shortterm Rehab.

## 2011-06-17 ENCOUNTER — Encounter (HOSPITAL_COMMUNITY): Payer: Self-pay | Admitting: Orthopedic Surgery

## 2011-06-17 LAB — CBC
MCV: 89.2 fL (ref 78.0–100.0)
Platelets: 140 10*3/uL — ABNORMAL LOW (ref 150–400)
RBC: 3.53 MIL/uL — ABNORMAL LOW (ref 4.22–5.81)
RDW: 12 % (ref 11.5–15.5)
WBC: 7.2 10*3/uL (ref 4.0–10.5)

## 2011-06-17 LAB — PROTIME-INR: Prothrombin Time: 20.5 seconds — ABNORMAL HIGH (ref 11.6–15.2)

## 2011-06-17 MED ORDER — METHOCARBAMOL 500 MG PO TABS
500.0000 mg | ORAL_TABLET | Freq: Four times a day (QID) | ORAL | Status: DC | PRN
  Administered 2011-06-17: 500 mg via ORAL
  Filled 2011-06-17: qty 1

## 2011-06-17 MED ORDER — FLEET ENEMA 7-19 GM/118ML RE ENEM
1.0000 | ENEMA | Freq: Once | RECTAL | Status: AC
  Administered 2011-06-17: 1 via RECTAL
  Filled 2011-06-17: qty 1

## 2011-06-17 MED ORDER — BISACODYL 10 MG RE SUPP
10.0000 mg | Freq: Every day | RECTAL | Status: DC | PRN
  Administered 2011-06-17: 10 mg via RECTAL
  Filled 2011-06-17: qty 1

## 2011-06-17 NOTE — Progress Notes (Signed)
Physical Therapy Treatment Patient Details Name: [name] MRN: [id number] DOB: 1950-10-12 Today's Date: 06/17/2011  PT Assessment/Plan  PT - Assessment/Plan Comments on Treatment Session: Pt contines to make progress, today focusing on quality of gait and increasing LE ROM/strength. Now requiring only S assist except for sit>supine PT Plan: Discharge plan remains appropriate Equipment Recommended: Defer to next venue PT Goals  Acute Rehab PT Goals PT Goal: Supine/Side to Sit - Progress: Met PT Goal: Sit to Supine/Side - Progress: Progressing toward goal PT Goal: Sit to Stand - Progress: Met PT Goal: Stand to Sit - Progress: Met PT Goal: Ambulate - Progress: Progressing toward goal PT Goal: Perform Home Exercise Program - Progress: Progressing toward goal  PT Treatment Precautions/Restrictions  Precautions Precautions: Posterior Hip Precaution Comments: pt able to to recall 3 out 3 precautions Restrictions Weight Bearing Restrictions: Yes LLE Weight Bearing: Weight bearing as tolerated Mobility (including Balance) Bed Mobility Bed Mobility: Yes Supine to Sit: 5: Supervision Supine to Sit Details (indicate cue type and reason): Cues for initiation and safe technique noted Sit to Supine - Left: 4: Min assist Sit to Supine - Left Details (indicate cue type and reason): Min A for L LE Transfers Transfers: Yes Sit to Stand: 5: Supervision;With upper extremity assist;From bed Sit to Stand Details (indicate cue type and reason): Safe hand placement noted, S for safety Stand to Sit: 5: Supervision;With upper extremity assist;To bed Stand to Sit Details: Cues for hand placement, pt sits with L LE extended Ambulation/Gait Ambulation/Gait: Yes Ambulation/Gait Assistance: 5: Supervision Ambulation/Gait Assistance Details (indicate cue type and reason): Cues to attempt step-through pattern and continuous gait as able. PT needed 3 standing rests and cues for posture, scapular  depression.  Ambulation Distance (Feet): 80 Feet Assistive device: Rolling walker Gait Pattern: Step-to pattern;Decreased step length - right;Decreased stance time - left;Decreased weight shift to left Stairs: No    Exercise  Total Joint Exercises Ankle Circles/Pumps: AROM;Both;20 reps Quad Sets: AROM;Left;10 reps;Supine Gluteal Sets: AROM;Both;10 reps;Supine Heel Slides: AROM;Left;10 reps;Supine Hip ABduction/ADduction: Left;10 reps;Supine;AROM Long Arc Quad: AROM;Left;10 reps;Seated Knee Flexion: AROM;Strengthening;Left;5 reps;Standing End of Session PT - End of Session Equipment Utilized During Treatment: Gait belt Activity Tolerance: Patient tolerated treatment well Patient left: in bed;with call bell in reach;with family/visitor present Nurse Communication: Mobility status for transfers;Mobility status for ambulation General Behavior During Session: [facility] for tasks performed Cognition: [facility] for tasks performed  [facility] [place], [place] [phone] 06/17/2011, 1:00 PM

## 2011-06-17 NOTE — Discharge Summary (Signed)
Patient ID: [name] MRN: [id number] DOB/AGE: 13-Jul-1950 60 y.o.  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 Pneumonia 2009  . 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 Mitral valve prolapse     does not see cardiologist for. last stress test 2003  . Hypercalcemia   . Arthritis   . GERD (gastroesophageal reflux disease)   . Hiatal hernia   . Lipoma     left forearm (3)  . Dysrhythmia     ":Due to MVP"  . Bronchitis     history of  . Tumor cells, benign     lung, left side    Surgeries: Procedure(s): TOTAL HIP REVISION on 06/14/2011   Consultants:    Discharged Condition: Improved  Hospital Course: [name] is an 60 y.o. male who was admitted 06/14/2011 for operative treatment ofPain due to hip joint prosthesis. Patient has severe unremitting pain that affects sleep, daily activities, and work/hobbies. After pre-op clearance the patient was taken to the operating room on 06/14/2011 and underwent  Procedure(s): TOTAL HIP REVISION.    Patient was given perioperative antibiotics: Anti-infectives     Start     Dose/Rate Route Frequency Ordered Stop   06/14/11 0841   ceFAZolin (ANCEF) 2-3 GM-% IVPB SOLR     Comments: [name], [name]: cabinet override         06/14/11 0841 06/14/11 1023   06/14/11 0832   ceFAZolin (ANCEF) IVPB 2 g/50 mL premix  Status:  Discontinued        2 g 100 mL/hr over 30 Minutes Intravenous 60 min pre-op 06/14/11 4098 06/14/11 1431   06/13/11 1345   ceFAZolin (ANCEF) IVPB 1 g/50 mL premix  Status:  Discontinued        1 g 100 mL/hr over 30 Minutes Intravenous 60 min pre-op 06/13/11 1336 06/14/11 1191           Patient was given sequential compression devices, early ambulation, and chemoprophylaxis to prevent DVT.  Patient benefited maximally  from hospital stay and there were no complications.    Recent vital signs: Patient Vitals for the past 24 hrs:  BP Temp Temp src Pulse Resp SpO2  06/17/11 0548 118/66 mmHg 98.1 F (36.7 C) - 91  18  94 %  2011/07/12 2201 91/52 mmHg 98.4 F (36.9 C) - 79  18  95 %  July 12, 2011 1400 98/57 mmHg 98.4 F (36.9 C) Oral 84  17  97 %     Recent laboratory studies:  Basename 06/17/11 0550 07-12-2011 0540 06/15/11 0530  WBC 7.2 7.5 --  HGB 10.9* 11.2* --  HCT 31.5* 32.5* --  PLT 140* 151 --  NA -- -- 136  K -- -- 3.8  CL -- -- 102  CO2 -- -- 29  BUN -- -- 11  CREATININE -- -- 1.08  GLUCOSE -- -- 130*  INR 1.72* 1.64* --  CALCIUM -- -- 7.7*     Discharge Medications:  Current Discharge Medication List    START taking these medications   Details  oxyCODONE-acetaminophen (ROXICET) 5-325 MG per tablet Take 1-2 tablets by mouth every 4 (four) hours as needed for pain. Qty: 60 tablet, Refills: 0    warfarin (COUMADIN) 5 MG tablet Take 1 tablet (5 mg total) by mouth daily. Qty: 30 tablet, Refills: 0  CONTINUE these medications which have NOT CHANGED   Details  FLUoxetine (PROZAC) 40 MG capsule Take 40 mg by mouth daily.      methylphenidate (RITALIN) 10 MG tablet Take 10 mg by mouth 3 (three) times daily.      Multiple Vitamin (MULTIVITAMIN) capsule Take 1 capsule by mouth daily.      omeprazole (PRILOSEC) 20 MG capsule Take 40 mg by mouth daily.     Probiotic Product (PROBIOTIC PO) Take by mouth daily.      simvastatin (ZOCOR) 20 MG tablet Take 20 mg by mouth at bedtime.          Diagnostic Studies: Dg Chest 2 View  06/08/2011  *RADIOLOGY REPORT*  Clinical Data: Preoperative assessment for revision of left total hip arthroplasty, history bronchitis, hiatal hernia, mitral valve prolapse  CHEST - 2 VIEW  Comparison: 05/24/2007  Findings: Normal heart size, mediastinal contours, and pulmonary vascularity. Lungs emphysematous but clear. No pleural effusion or pneumothorax. End  plate spur formation at multiple levels of the thoracic spine.  IMPRESSION: Emphysematous changes. No acute abnormalities.  Original Report Authenticated By: Lollie Marrow, M.D.   Dg Pelvis Portable  06/14/2011  *RADIOLOGY REPORT*  Clinical Data: Postop left hip.  PORTABLE PELVIS,PORTABLE LEFT HIP - 1 VIEW  Comparison: None.  Findings: Post left total hip replacement which appears in satisfactory position.  The lateral view does not include the distal aspect of the femoral component.  No fracture identified on the frontal view.  Mild right hip joint degenerative changes.  IMPRESSION: Satisfactory position total left hip replacement.  Original Report Authenticated By: Fuller Canada, M.D.   Dg Hip Portable 1 View Left  06/14/2011  *RADIOLOGY REPORT*  Clinical Data: Postop left hip.  PORTABLE PELVIS,PORTABLE LEFT HIP - 1 VIEW  Comparison: None.  Findings: Post left total hip replacement which appears in satisfactory position.  The lateral view does not include the distal aspect of the femoral component.  No fracture identified on the frontal view.  Mild right hip joint degenerative changes.  IMPRESSION: Satisfactory position total left hip replacement.  Original Report Authenticated By: Fuller Canada, M.D.    Disposition: SNF 1-2 weeks  Discharge Orders    Future Orders Please Complete By Expires   Diet - low sodium heart healthy      Increase activity slowly      Walker       May shower / Bathe      Driving Restrictions      Comments:   No driving for 2 weeks.   Change dressing (specify)      Comments:   Dressing change as needed.   Call MD for:  temperature >100.4      Call MD for:  severe uncontrolled pain      Call MD for:  redness, tenderness, or signs of infection (pain, swelling, redness, odor or green/yellow discharge around incision site)      Discharge instructions      Comments:   F/U with Dr. Turner Daniels in 10 days         Signed: Nestor Lewandowsky 06/17/2011, 8:22  AM

## 2011-06-17 NOTE — Progress Notes (Signed)
Received call from Insurance company at 5:20 pm this evening to give auth for d/c to SNF. Spoke with Janie at [employer]- it is too late in day to get patient to facility. Will plan d/c to SNF in a.m. Notified patient's nurse- she will tell patient/wife.  Darylene Price, BSW,  06/17/2011 5:54 PM

## 2011-06-17 NOTE — Progress Notes (Signed)
Patient ID: [name], male   DOB: 30-May-1951, 60 y.o.   MRN: [id number] PATIENT ID: [name]  MRN: [id number]  DOB/AGE:  05-Jul-1951 / 60 y.o.  3 Days Post-Op Procedure(s) (LRB): TOTAL HIP REVISION (Left)    PROGRESS NOTE Subjective: Patient is alert, oriented,noNausea, no Vomiting, yes passing gas, no Bowel Movement. Taking PO well. Denies SOB, Chest or Calf Pain. Using Incentive Spirometer, PAS in place. Ambulate in hallway Patient reports pain as 2 on 0-10 scale  .    Objective: Vital signs in last 24 hours: Filed Vitals:   06/16/11 0549 06/16/11 1400 06/16/11 2201 06/17/11 0548  BP: 98/60 98/57 91/52  118/66  Pulse: 91 84 79 91  Temp: 98.5 F (36.9 C) 98.4 F (36.9 C) 98.4 F (36.9 C) 98.1 F (36.7 C)  TempSrc: Oral Oral    Resp: 18 17 18 18   SpO2: 94% 97% 95% 94%      Intake/Output from previous day: I/O last 3 completed shifts: In: 1070 [P.O.:990; I.V.:80] Out: 2750 [Urine:2750]   Intake/Output this shift:     LABORATORY DATA:  Basename 06/17/11 0550 06/16/11 0540 06/15/11 0530  WBC 7.2 7.5 --  HGB 10.9* 11.2* --  HCT 31.5* 32.5* --  PLT 140* 151 --  NA -- -- 136  K -- -- 3.8  CL -- -- 102  CO2 -- -- 29  BUN -- -- 11  CREATININE -- -- 1.08  GLUCOSE -- -- 130*  GLUCAP -- -- --  INR 1.72* 1.64* --  CALCIUM -- -- 7.7*    Examination: Neurologically intact ABD soft Neurovascular intact Sensation intact distally Intact pulses distally Dorsiflexion/Plantar flexion intact Incision: dressing C/D/I No cellulitis present Compartment soft} XR AP&Lat of hip shows well placed\fixed THA  Assessment:   3 Days Post-Op Procedure(s) (LRB): TOTAL HIP REVISION (Left)  Plan: PT/OT WBAT, THA  posterior precautions  DVT Prophylaxis: Lovenox\Coumadin bridge, monitor INR 1.5-2.0 target  DISCHARGE PLAN: Skilled Nursing Facility/Rehab  DISCHARGE NEEDS: HHPT, HHRN, CPM, Walker and 3-in-1 comode seat

## 2011-06-17 NOTE — Progress Notes (Signed)
Occupational Therapy Treatment Patient Details Name: [name] MRN: [id number] DOB: 09/18/1950 Today's Date: 06/17/2011  OT Assessment/Plan OT Assessment/Plan Comments on Treatment Session: pt verbalizing the need to void bowels but only passing gas without void during session OT Plan: Discharge plan remains appropriate OT Frequency: Min 2X/week Follow Up Recommendations: Skilled nursing facility Equipment Recommended: Defer to next venue OT Goals Acute Rehab OT Goals OT Goal Formulation: With patient Time For Goal Achievement: 2 weeks ADL Goals Pt Will Transfer to Toilet: with min assist;3-in-1 ADL Goal: Toilet Transfer - Progress: Met Pt Will Perform Toileting - Clothing Manipulation: with supervision ADL Goal: Toileting - Clothing Manipulation - Progress: Progressing toward goals Pt Will Perform Toileting - Hygiene: with supervision;Sit to stand from 3-in-1/toilet ADL Goal: Toileting - Hygiene - Progress: Progressing toward goals Miscellaneous OT Goals Miscellaneous OT Goal #1: pt will perform bed mobility Min A with bed rails HOb <20 degrees no trapeze bar as precursor to ADLS OT Goal: Miscellaneous Goal #1 - Progress: Progressing toward goals  OT Treatment Precautions/Restrictions  Precautions Precautions: Posterior Hip Precaution Comments: pt able to to recall 3 out 3 precautions Restrictions Weight Bearing Restrictions: Yes LLE Weight Bearing: Weight bearing as tolerated   ADL ADL Eating/Feeding: Performed;Modified independent Where Assessed - Eating/Feeding: Bed level Grooming: Performed;Wash/dry hands;Teeth care;Supervision/safety Where Assessed - Grooming: Sitting at sink Toilet Transfer: Performed;Supervision/safety Toilet Transfer Method: Stand pivot;Ambulating Toilet Transfer Equipment: Raised toilet seat with arms (or 3-in-1 over toilet) Toileting - Clothing Manipulation: Performed;Supervision/safety Where Assessed - Toileting Clothing Manipulation:  Sit to stand from 3-in-1 or toilet Toileting - Hygiene: Performed;Supervision/safety Where Assessed - Toileting Hygiene: Sit to stand from 3-in-1 or toilet Equipment Used: Rolling walker ADL Comments: Pt c/o diffic Mobility  Bed Mobility Bed Mobility: Yes Supine to Sit: 5: Supervision Supine to Sit Details (indicate cue type and reason): Cues for initiation and safe technique noted Sit to Supine - Left: 4: Min assist Sit to Supine - Left Details (indicate cue type and reason): Min A for L LE Transfers Transfers: Yes Sit to Stand: 5: Supervision;With upper extremity assist;From bed Sit to Stand Details (indicate cue type and reason): Safe hand placement noted, S for safety Stand to Sit: 5: Supervision;With upper extremity assist;To bed Stand to Sit Details: Cues for hand placement, pt sits with L LE extended Exercises Total Joint Exercises Ankle Circles/Pumps: AROM;Both;20 reps  End of Session OT - End of Session Equipment Utilized During Treatment: Gait belt Activity Tolerance: Patient tolerated treatment well Patient left: with call bell in reach;in chair;with family/visitor present (WIFE) Nurse Communication: Mobility status for transfers General Behavior During Session: [name] for tasks performed Cognition: [name] for tasks performed  Lucile Shutters  06/17/2011, 10:58 AM Pager: [phone]

## 2011-06-18 LAB — PROTIME-INR
INR: 1.61 — ABNORMAL HIGH (ref 0.00–1.49)
Prothrombin Time: 19.4 s — ABNORMAL HIGH (ref 11.6–15.2)

## 2011-06-18 MED ORDER — WARFARIN SODIUM 5 MG PO TABS
5.0000 mg | ORAL_TABLET | Freq: Once | ORAL | Status: DC
  Filled 2011-06-18: qty 1

## 2011-06-18 NOTE — Discharge Summary (Signed)
Patient ID: [name] MRN: [id number] DOB/AGE: 61-Apr-1952 61 y.o.  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 Pneumonia 2009  . 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 Mitral valve prolapse     does not see cardiologist for. last stress test 2003  . Hypercalcemia   . Arthritis   . GERD (gastroesophageal reflux disease)   . Hiatal hernia   . Lipoma     left forearm (3)  . Dysrhythmia     ":Due to MVP"  . Bronchitis     history of  . Tumor cells, benign     lung, left side    Surgeries: Procedure(s): TOTAL HIP REVISION on 06/14/2011   Consultants:    Discharged Condition: Improved  Hospital Course: AMILCAR REEVER is an 60 y.o. male who was admitted 06/14/2011 for operative treatment ofPain due to hip joint prosthesis. Patient has severe unremitting pain that affects sleep, daily activities, and work/hobbies. After pre-op clearance the patient was taken to the operating room on 06/14/2011 and underwent  Procedure(s): TOTAL HIP REVISION.    Patient was given perioperative antibiotics: Anti-infectives     Start     Dose/Rate Route Frequency Ordered Stop   06/14/11 0841   ceFAZolin (ANCEF) 2-3 GM-% IVPB SOLR     Comments: MICHAEL, CYNTHIA: cabinet override         06/14/11 0841 06/14/11 1023   06/14/11 0832   ceFAZolin (ANCEF) IVPB 2 g/50 mL premix  Status:  Discontinued        2 g 100 mL/hr over 30 Minutes Intravenous 60 min pre-op 06/14/11 7829 06/14/11 1431   06/13/11 1345   ceFAZolin (ANCEF) IVPB 1 g/50 mL premix  Status:  Discontinued        1 g 100 mL/hr over 30 Minutes Intravenous 60 min pre-op 06/13/11 1336 06/14/11 5621           Patient was given sequential compression devices, early ambulation, and chemoprophylaxis to prevent DVT.  Patient benefited maximally from hospital  stay and there were no complications.    Recent vital signs: Patient Vitals for the past 24 hrs:  BP Temp Pulse Resp SpO2  06/18/11 0604 105/59 mmHg 98.7 F (37.1 C) 93  18  97 %  10-Jul-2011 2058 108/59 mmHg 98 F (36.7 C) 87  18  95 %  07-10-11 1438 107/68 mmHg 98.9 F (37.2 C) 91  18  96 %     Recent laboratory studies:  Basename 06/18/11 0615 07-10-11 0550 06/16/11 0540  WBC -- 7.2 7.5  HGB -- 10.9* 11.2*  HCT -- 31.5* 32.5*  PLT -- 140* 151  NA -- -- --  K -- -- --  CL -- -- --  CO2 -- -- --  BUN -- -- --  CREATININE -- -- --  GLUCOSE -- -- --  INR 1.61* 1.72* --  CALCIUM -- -- --     Discharge Medications:  Current Discharge Medication List    START taking these medications   Details  oxyCODONE-acetaminophen (ROXICET) 5-325 MG per tablet Take 1-2 tablets by mouth every 4 (four) hours as needed for pain. Qty: 60 tablet, Refills: 0    warfarin (COUMADIN) 5 MG tablet Take 1 tablet (5 mg total) by mouth daily. Qty: 30 tablet, Refills: 0      CONTINUE  these medications which have NOT CHANGED   Details  FLUoxetine (PROZAC) 40 MG capsule Take 40 mg by mouth daily.      methylphenidate (RITALIN) 10 MG tablet Take 10 mg by mouth 3 (three) times daily.      Multiple Vitamin (MULTIVITAMIN) capsule Take 1 capsule by mouth daily.      omeprazole (PRILOSEC) 20 MG capsule Take 40 mg by mouth daily.     Probiotic Product (PROBIOTIC PO) Take by mouth daily.      simvastatin (ZOCOR) 20 MG tablet Take 20 mg by mouth at bedtime.          Diagnostic Studies: Dg Chest 2 View  06/08/2011  *RADIOLOGY REPORT*  Clinical Data: Preoperative assessment for revision of left total hip arthroplasty, history bronchitis, hiatal hernia, mitral valve prolapse  CHEST - 2 VIEW  Comparison: 05/24/2007  Findings: Normal heart size, mediastinal contours, and pulmonary vascularity. Lungs emphysematous but clear. No pleural effusion or pneumothorax. End plate spur formation at multiple levels of  the thoracic spine.  IMPRESSION: Emphysematous changes. No acute abnormalities.  Original Report Authenticated By: Lollie Marrow, M.D.   Dg Pelvis Portable  06/14/2011  *RADIOLOGY REPORT*  Clinical Data: Postop left hip.  PORTABLE PELVIS,PORTABLE LEFT HIP - 1 VIEW  Comparison: None.  Findings: Post left total hip replacement which appears in satisfactory position.  The lateral view does not include the distal aspect of the femoral component.  No fracture identified on the frontal view.  Mild right hip joint degenerative changes.  IMPRESSION: Satisfactory position total left hip replacement.  Original Report Authenticated By: Fuller Canada, M.D.   Dg Hip Portable 1 View Left  06/14/2011  *RADIOLOGY REPORT*  Clinical Data: Postop left hip.  PORTABLE PELVIS,PORTABLE LEFT HIP - 1 VIEW  Comparison: None.  Findings: Post left total hip replacement which appears in satisfactory position.  The lateral view does not include the distal aspect of the femoral component.  No fracture identified on the frontal view.  Mild right hip joint degenerative changes.  IMPRESSION: Satisfactory position total left hip replacement.  Original Report Authenticated By: Fuller Canada, M.D.    Disposition: Home or Self Care  Discharge Orders    Future Orders Please Complete By Expires   Diet - low sodium heart healthy      Increase activity slowly      Walker       May shower / Bathe      Driving Restrictions      Comments:   No driving for 2 weeks.   Change dressing (specify)      Comments:   Dressing change as needed.   Call MD for:  temperature >100.4      Call MD for:  severe uncontrolled pain      Call MD for:  redness, tenderness, or signs of infection (pain, swelling, redness, odor or green/yellow discharge around incision site)      Discharge instructions      Comments:   F/U with Dr. Turner Daniels in 10 days         Signed: Hazle Nordmann. 06/18/2011, 8:36 AM

## 2011-06-18 NOTE — Progress Notes (Signed)
Ok for d/c to SNF today. Wife has signed papers at [facility]. Insurance Berkley Harvey is in place. EMS to transport. Notified pt's nurse of d/c plan. Patient and wife are pleased with d/c plan. Darylene Price, BSW, 06/18/2011 9:49 AM

## 2011-06-18 NOTE — Progress Notes (Signed)
PATIENT ID: [name]  MRN: [id number]  DOB/AGE:  61-Apr-1952 / 60 y.o.  4 Days Post-Op Procedure(s) (LRB): TOTAL HIP REVISION (Left)    PROGRESS NOTE Subjective: Patient is alert, oriented,noNausea, no Vomiting, yes passing gas, yes Bowel Movement. Taking PO well. Denies SOB, Chest or Calf Pain. Using Incentive Spirometer, PAS in place. Ambulating with PT Patient reports pain as moderate  .    Objective: Vital signs in last 24 hours: Filed Vitals:   06/17/11 0548 06/17/11 1438 06/17/11 2058 06/18/11 0604  BP: 118/66 107/68 108/59 105/59  Pulse: 91 91 87 93  Temp: 98.1 F (36.7 C) 98.9 F (37.2 C) 98 F (36.7 C) 98.7 F (37.1 C)  TempSrc:      Resp: 18 18 18 18   SpO2: 94% 96% 95% 97%      Intake/Output from previous day: I/O last 3 completed shifts: In: -  Out: 1250 [Urine:1250]   Intake/Output this shift:     LABORATORY DATA:  Basename 06/18/11 0615 06/17/11 0550 06/16/11 0540  WBC -- 7.2 7.5  HGB -- 10.9* 11.2*  HCT -- 31.5* 32.5*  PLT -- 140* 151  NA -- -- --  K -- -- --  CL -- -- --  CO2 -- -- --  BUN -- -- --  CREATININE -- -- --  GLUCOSE -- -- --  GLUCAP -- -- --  INR 1.61* 1.72* --  CALCIUM -- -- --    Examination: Neurologically intact ABD soft Neurovascular intact Sensation intact distally Incision: scant drainage} XR AP&Lat of hip shows well placed\fixed THA  Assessment:   4 Days Post-Op Procedure(s) (LRB): TOTAL HIP REVISION (Left) ADDITIONAL DIAGNOSIS:  none  Plan: PT/OT WBAT, THA  posterior precautions  DVT Prophylaxis: Lovenox\Coumadin bridge, monitor INR 1.5-2.0 target  DISCHARGE PLAN: Skilled Nursing Facility/Rehab  DISCHARGE NEEDS: HHPT, HHRN, Walker and 3-in-1 comode seat

## 2011-06-18 NOTE — Progress Notes (Signed)
ANTICOAGULATION CONSULT NOTE - Follow Up Consult  Pharmacy Consult for Coumadin with Lovenox bridge Indication: VTE prophylaxis s/p L-THA  Allergies  Allergen Reactions  . Ciprofloxacin   . Ciprofloxacin Hcl Other (See Comments)    Burning in ear when drops were used    Patient Measurements:     Vital Signs: Temp: 98.7 F (37.1 C) (12/14 0604) BP: 105/59 mmHg (12/14 0604) Pulse Rate: 93  (12/14 0604)  Labs:  Basename 06/18/11 0615 06/17/11 0550 06/16/11 0540  HGB -- 10.9* 11.2*  HCT -- 31.5* 32.5*  PLT -- 140* 151  APTT -- -- --  LABPROT 19.4* 20.5* 19.7*  INR 1.61* 1.72* 1.64*  HEPARINUNFRC -- -- --  CREATININE -- -- --  CKTOTAL -- -- --  CKMB -- -- --  TROPONINI -- -- --   The CrCl is unknown because both a height and weight (above a minimum accepted value) are required for this calculation.   Medications:  Scheduled:    . enoxaparin  40 mg Subcutaneous Q24H  . FLUoxetine  40 mg Oral Daily  . methylphenidate  10 mg Oral TID  . pantoprazole  40 mg Oral Q1200  . patient's guide to using coumadin book   Does not apply Once  . simvastatin  20 mg Oral QHS  . sodium phosphate  1 enema Rectal Once  . warfarin   Does not apply Once    Assessment: 60 y.o. M on warfarin for VTE px s/p L THA on 12/10.  INR within stated range of 1.5-2.  Lovenox bridging until INR >= 1.8. Patient did not receive Coumadin yesterday (12/13).  Goal of Therapy:  INR 1.5 - 2   Plan:  Continue Lovenox until INR >= 1.8 as per orders. Coumadin 5mg  today.  Dajion Bickford, Elisha Headland, Pharm.D. 06/18/2011 10:25 AM

## 2011-06-18 NOTE — Progress Notes (Signed)
PT CANCELLATION NOTE:  Pt politely declined PT stating that he would be leaving for SNF soon.  RN confirmed pt to transfer to SNF today.   Acute PT signing off  [name] L. Rolande Moe DPT [phone] 06/18/2011

## 2011-06-22 NOTE — Progress Notes (Signed)
Utilization review completed. Angel Weedon, RN, BSN. 06/22/11 

## 2012-08-17 ENCOUNTER — Emergency Department (HOSPITAL_COMMUNITY): Payer: BC Managed Care – PPO

## 2012-08-17 ENCOUNTER — Inpatient Hospital Stay (HOSPITAL_COMMUNITY)
Admission: EM | Admit: 2012-08-17 | Discharge: 2012-08-27 | DRG: 813 | Disposition: A | Discharge status: Home / Self Care | Payer: BC Managed Care – PPO | Attending: Internal Medicine | Admitting: Internal Medicine

## 2012-08-17 ENCOUNTER — Encounter (HOSPITAL_COMMUNITY): Payer: Self-pay | Admitting: *Deleted

## 2012-08-17 DIAGNOSIS — A088 Other specified intestinal infections: Principal | ICD-10-CM | POA: Diagnosis present

## 2012-08-17 DIAGNOSIS — F329 Major depressive disorder, single episode, unspecified: Secondary | ICD-10-CM

## 2012-08-17 DIAGNOSIS — E876 Hypokalemia: Secondary | ICD-10-CM | POA: Diagnosis not present

## 2012-08-17 DIAGNOSIS — I059 Rheumatic mitral valve disease, unspecified: Secondary | ICD-10-CM | POA: Diagnosis present

## 2012-08-17 DIAGNOSIS — J329 Chronic sinusitis, unspecified: Secondary | ICD-10-CM

## 2012-08-17 DIAGNOSIS — F3289 Other specified depressive episodes: Secondary | ICD-10-CM

## 2012-08-17 DIAGNOSIS — K560 Paralytic ileus: Secondary | ICD-10-CM | POA: Diagnosis present

## 2012-08-17 DIAGNOSIS — Z87891 Personal history of nicotine dependence: Secondary | ICD-10-CM

## 2012-08-17 DIAGNOSIS — K2270 Barrett's esophagus without dysplasia: Secondary | ICD-10-CM | POA: Diagnosis present

## 2012-08-17 DIAGNOSIS — E785 Hyperlipidemia, unspecified: Secondary | ICD-10-CM | POA: Diagnosis present

## 2012-08-17 DIAGNOSIS — D179 Benign lipomatous neoplasm, unspecified: Secondary | ICD-10-CM

## 2012-08-17 DIAGNOSIS — H6690 Otitis media, unspecified, unspecified ear: Secondary | ICD-10-CM

## 2012-08-17 DIAGNOSIS — K219 Gastro-esophageal reflux disease without esophagitis: Secondary | ICD-10-CM | POA: Diagnosis present

## 2012-08-17 DIAGNOSIS — K449 Diaphragmatic hernia without obstruction or gangrene: Secondary | ICD-10-CM | POA: Diagnosis present

## 2012-08-17 DIAGNOSIS — E210 Primary hyperparathyroidism: Secondary | ICD-10-CM

## 2012-08-17 DIAGNOSIS — E860 Dehydration: Secondary | ICD-10-CM

## 2012-08-17 DIAGNOSIS — T8484XA Pain due to internal orthopedic prosthetic devices, implants and grafts, initial encounter: Secondary | ICD-10-CM

## 2012-08-17 DIAGNOSIS — S72009A Fracture of unspecified part of neck of unspecified femur, initial encounter for closed fracture: Secondary | ICD-10-CM

## 2012-08-17 DIAGNOSIS — F489 Nonpsychotic mental disorder, unspecified: Secondary | ICD-10-CM | POA: Diagnosis present

## 2012-08-17 DIAGNOSIS — Z9089 Acquired absence of other organs: Secondary | ICD-10-CM

## 2012-08-17 DIAGNOSIS — Z96649 Presence of unspecified artificial hip joint: Secondary | ICD-10-CM

## 2012-08-17 DIAGNOSIS — M129 Arthropathy, unspecified: Secondary | ICD-10-CM | POA: Diagnosis present

## 2012-08-17 DIAGNOSIS — K567 Ileus, unspecified: Secondary | ICD-10-CM

## 2012-08-17 DIAGNOSIS — Z881 Allergy status to other antibiotic agents status: Secondary | ICD-10-CM

## 2012-08-17 DIAGNOSIS — Z87898 Personal history of other specified conditions: Secondary | ICD-10-CM

## 2012-08-17 DIAGNOSIS — J189 Pneumonia, unspecified organism: Secondary | ICD-10-CM

## 2012-08-17 DIAGNOSIS — F988 Other specified behavioral and emotional disorders with onset usually occurring in childhood and adolescence: Secondary | ICD-10-CM

## 2012-08-17 DIAGNOSIS — Z79899 Other long term (current) drug therapy: Secondary | ICD-10-CM

## 2012-08-17 LAB — COMPREHENSIVE METABOLIC PANEL
ALT: 66 U/L — ABNORMAL HIGH (ref 0–53)
AST: 47 U/L — ABNORMAL HIGH (ref 0–37)
Alkaline Phosphatase: 104 U/L (ref 39–117)
Calcium: 8.9 mg/dL (ref 8.4–10.5)
Potassium: 4 mEq/L (ref 3.5–5.1)
Sodium: 138 mEq/L (ref 135–145)
Total Protein: 6.9 g/dL (ref 6.0–8.3)

## 2012-08-17 LAB — CBC WITH DIFFERENTIAL/PLATELET
Basophils Absolute: 0 10*3/uL (ref 0.0–0.1)
Eosinophils Absolute: 0.1 10*3/uL (ref 0.0–0.7)
Eosinophils Relative: 1 % (ref 0–5)
Lymphocytes Relative: 24 % (ref 12–46)
Lymphs Abs: 1.4 10*3/uL (ref 0.7–4.0)
MCH: 32.3 pg (ref 26.0–34.0)
MCV: 90.7 fL (ref 78.0–100.0)
Neutrophils Relative %: 62 % (ref 43–77)
Platelets: 159 10*3/uL (ref 150–400)
RBC: 5.35 MIL/uL (ref 4.22–5.81)
RDW: 12.6 % (ref 11.5–15.5)
WBC: 5.7 10*3/uL (ref 4.0–10.5)

## 2012-08-17 LAB — URINALYSIS, ROUTINE W REFLEX MICROSCOPIC
Glucose, UA: NEGATIVE mg/dL
Hgb urine dipstick: NEGATIVE
Ketones, ur: 15 mg/dL — AB
Specific Gravity, Urine: 1.037 — ABNORMAL HIGH (ref 1.005–1.030)
pH: 6 (ref 5.0–8.0)

## 2012-08-17 LAB — URINE MICROSCOPIC-ADD ON

## 2012-08-17 MED ORDER — GI COCKTAIL ~~LOC~~
30.0000 mL | Freq: Once | ORAL | Status: AC
  Administered 2012-08-17: 30 mL via ORAL
  Filled 2012-08-17: qty 30

## 2012-08-17 MED ORDER — ONDANSETRON HCL 4 MG/2ML IJ SOLN
4.0000 mg | Freq: Once | INTRAMUSCULAR | Status: AC
  Administered 2012-08-17: 4 mg via INTRAVENOUS
  Filled 2012-08-17: qty 2

## 2012-08-17 MED ORDER — IOHEXOL 300 MG/ML  SOLN
20.0000 mL | INTRAMUSCULAR | Status: AC
  Administered 2012-08-17: 50 mL via ORAL

## 2012-08-17 MED ORDER — SODIUM CHLORIDE 0.9 % IV BOLUS (SEPSIS)
1000.0000 mL | Freq: Once | INTRAVENOUS | Status: AC
  Administered 2012-08-17: 1000 mL via INTRAVENOUS

## 2012-08-17 MED ORDER — DEXTROSE 5 % IV SOLN
1.0000 g | Freq: Once | INTRAVENOUS | Status: AC
  Administered 2012-08-17: 1 g via INTRAVENOUS
  Filled 2012-08-17: qty 10

## 2012-08-17 MED ORDER — FAMOTIDINE IN NACL 20-0.9 MG/50ML-% IV SOLN
20.0000 mg | Freq: Once | INTRAVENOUS | Status: AC
  Administered 2012-08-17: 20 mg via INTRAVENOUS
  Filled 2012-08-17: qty 50

## 2012-08-17 NOTE — ED Provider Notes (Signed)
History     CSN: [id number]  Arrival date & time 08/17/12  7741   First MD Initiated Contact with Patient 08/17/12 1951      Chief Complaint  Patient presents with  . Abdominal Pain    (Consider location/radiation/quality/duration/timing/severity/associated sxs/prior treatment) The history is provided by the patient.  [name] is a 62 y.o. male hx of GERD, mitral valve prolapse here with ab pain, vomiting, dehydration. The last 5 days he had some epigastric pain and felt nauseous. Did decrease by mouth intake due to the nausea but did not vomit. He also has several episodes of diarrhea but not passing gas since yesterday.He feels that he has some swollen lymph nodes in his neck. History of appendectomy and cholecystectomy. No fevers the wife has similar symptoms. He called his doctor and was sent in for evaluation because he has been having some dysuria and has decreased urine output.   Past Medical History  Diagnosis Date  . Pneumonia 2009  . 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 Mitral valve prolapse     does not see cardiologist for. last stress test 2003  . Hypercalcemia   . Arthritis   . GERD (gastroesophageal reflux disease)   . Hiatal hernia   . Lipoma     left forearm (3)  . Dysrhythmia     ":Due to MVP"  . Bronchitis     history of  . 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 Cholecystectomy  2002  . Mastoidectomy  1996  . Appendectomy  1984  . Cataract extraction      L and R eye  . Parathyroidectomy    . Nasal septum surgery      sinus surgery  . Total hip revision  06/14/2011    Procedure: TOTAL HIP REVISION;  Surgeon: Nestor Lewandowsky;  Location: [facility] OR;  Service: Orthopedics;  Laterality: Left;  Left Acetabular  Hip Revision    Family History  Problem Relation Age of Onset  . Cancer Father     lung  . Cancer Brother     prostate    History  Substance Use Topics  .  Smoking status: Former Smoker -- 1.50 packs/day for 24 years    Types: Cigarettes  . Smokeless tobacco: Not on file     Comment: quit smoking in 1987  . Alcohol Use: No     Comment: none      Review of Systems  Gastrointestinal: Positive for nausea, abdominal pain and diarrhea.  All other systems reviewed and are negative.    Allergies  Ciprofloxacin hcl  Home Medications   Current Outpatient Rx  Name  Route  Sig  Dispense  Refill  . buPROPion (WELLBUTRIN SR) 150 MG 12 hr tablet   Oral   Take 300 mg by mouth 2 (two) times daily.         . Coenzyme Q10 (COQ-10) 200 MG CAPS   Oral   Take 1 capsule by mouth daily.         Marland Kitchen FLUoxetine (PROZAC) 40 MG capsule   Oral   Take 40 mg by mouth daily.           . methylphenidate (RITALIN) 10 MG tablet   Oral   Take 10 mg by mouth 3 (three) times daily.           . Multiple Vitamin (MULTIVITAMIN) capsule  Oral   Take 1 capsule by mouth daily.           Marland Kitchen omeprazole (PRILOSEC) 20 MG capsule   Oral   Take 40 mg by mouth daily.          . simvastatin (ZOCOR) 20 MG tablet   Oral   Take 20 mg by mouth at bedtime.             BP 133/86  Pulse 93  Temp(Src) 97.6 F (36.4 C) (Oral)  Resp 16  SpO2 99%  Physical Exam  Nursing note and vitals reviewed. Constitutional: He is oriented to person, place, and time. He appears well-developed and well-nourished.  HENT:  Head: Normocephalic.  MM dry. Pharynx not red.   Eyes: Conjunctivae are normal. Pupils are equal, round, and reactive to light.  Neck: Normal range of motion.  + some cervical LAD   Cardiovascular: Normal rate and regular rhythm.   Pulmonary/Chest: Effort normal and breath sounds normal. No respiratory distress. He has no wheezes. He has no rales.  Abdominal: Soft. Bowel sounds are normal.   Mild epigastric tenderness ,no rebound   Musculoskeletal: Normal range of motion.  Neurological: He is alert and oriented to person, place, and time.   Skin: Skin is warm and dry.  Psychiatric: He has a normal mood and affect. His behavior is normal. Judgment and thought content normal.    ED Course  Procedures (including critical care time)  Labs Reviewed  CBC WITH DIFFERENTIAL - Abnormal; Notable for the following:    Hemoglobin 17.3 (*)    Monocytes Relative 13 (*)    All other components within normal limits  COMPREHENSIVE METABOLIC PANEL - Abnormal; Notable for the following:    Glucose, Bld 109 (*)    BUN 27 (*)    AST 47 (*)    ALT 66 (*)    GFR calc non Af Amer 73 (*)    GFR calc Af Amer 85 (*)    All other components within normal limits  URINALYSIS, ROUTINE W REFLEX MICROSCOPIC - Abnormal; Notable for the following:    Color, Urine AMBER (*)    APPearance CLOUDY (*)    Specific Gravity, Urine 1.037 (*)    Bilirubin Urine SMALL (*)    Ketones, ur 15 (*)    Protein, ur 30 (*)    Leukocytes, UA TRACE (*)    All other components within normal limits  URINE MICROSCOPIC-ADD ON - Abnormal; Notable for the following:    Casts HYALINE CASTS (*)    All other components within normal limits  LIPASE, BLOOD   Dg Abd Acute W/chest  08/17/2012  *RADIOLOGY REPORT*  Clinical Data: Shortness of breath, vomiting, fever, abdominal pain and bloating.  ACUTE ABDOMEN SERIES (ABDOMEN 2 VIEW & CHEST 1 VIEW)  Comparison: Chest radiograph performed 06/08/2011, and pelvis radiograph performed 06/14/2011  Findings: The lungs are relatively well-aerated; there is mild elevation of the left hemidiaphragm.  Minimal bilateral airspace opacities could reflect mild pneumonia.  No pleural effusion or pneumothorax is seen.  The cardiomediastinal silhouette is within normal limits.  The visualized bowel gas pattern is nonspecific.  A few distended loops of small bowel are seen, measuring up to 4.5 cm in diameter, with an associated air-fluid level. However, these loops demonstrate gradual decompression, without evidence of obstruction. Residual air and  fluid are noted within the colon.  No free intra- abdominal air is identified on the provided upright view.  Clips are noted within  the right upper quadrant, reflecting prior cholecystectomy.  No acute osseous abnormalities are seen; the sacroiliac joints are unremarkable in appearance. 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  There is partial sacralization of vertebral body L5 on the left side.  IMPRESSION:  1.  Minimal bilateral airspace opacities could reflect mild pneumonia. 2.  Nonspecific bowel gas pattern; few distended loops of small bowel measure up to 4.5 cm, with an associated air-fluid level. However, there is gradual decompression of these loops, without definite evidence of obstruction.  Residual air and fluid are also seen within the colon.  This could reflect some degree of dysmotility, or less likely a partial small bowel obstruction; would correlate with symptoms.   Original Report Authenticated By: Tonia Ghent, M.D.      No diagnosis found.    MDM  [name] is a 62 y.o. male here with ab pain, dehydration. Likely gastroenteritis. Given hx of multiple abdominal surgeries, will get xray ab to r/o SBO. Will check labs, lipase, and give IVF and zofran and reassess.   11:41 PM Xrays showed possible SBO. Will get CT ab/pel. I signed out to Dr. Verl Bangs in the ED.       Richardean Canal, MD 08/17/12 [phone]

## 2012-08-17 NOTE — ED Notes (Signed)
Pt states he is feeling nauseated with the po contrast.  Prn zofran given

## 2012-08-17 NOTE — ED Notes (Signed)
Pt states that he has had a stomach bug for the past 5 days. Pt states that he has been able to keep hydrated and drank about 50 oz of water today but has not urinated much throughout the days. Pt states unable to tolerate large amounts of food. Pt states that his bowel movment was today. Pt states that he also feels like his lymph nodes are swollen in his throat, pt states generalized abdominal pain. Pt states Nausea but no vomiting.

## 2012-08-18 ENCOUNTER — Encounter (HOSPITAL_COMMUNITY): Payer: Self-pay | Admitting: Radiology

## 2012-08-18 ENCOUNTER — Emergency Department (HOSPITAL_COMMUNITY): Payer: BC Managed Care – PPO

## 2012-08-18 ENCOUNTER — Inpatient Hospital Stay (HOSPITAL_COMMUNITY): Payer: BC Managed Care – PPO

## 2012-08-18 DIAGNOSIS — R1013 Epigastric pain: Secondary | ICD-10-CM

## 2012-08-18 DIAGNOSIS — K567 Ileus, unspecified: Secondary | ICD-10-CM

## 2012-08-18 DIAGNOSIS — K56609 Unspecified intestinal obstruction, unspecified as to partial versus complete obstruction: Secondary | ICD-10-CM

## 2012-08-18 LAB — CBC
Hemoglobin: 14 g/dL (ref 13.0–17.0)
Platelets: 131 10*3/uL — ABNORMAL LOW (ref 150–400)
RBC: 4.41 MIL/uL (ref 4.22–5.81)
RBC: 4.4 MIL/uL (ref 4.22–5.81)
RDW: 12.4 % (ref 11.5–15.5)
WBC: 4.9 10*3/uL (ref 4.0–10.5)
WBC: 6.3 10*3/uL (ref 4.0–10.5)

## 2012-08-18 LAB — CREATININE, SERUM
Creatinine, Ser: 1.03 mg/dL (ref 0.50–1.35)
GFR calc non Af Amer: 76 mL/min — ABNORMAL LOW (ref >90)

## 2012-08-18 LAB — COMPREHENSIVE METABOLIC PANEL
Alkaline Phosphatase: 97 U/L (ref 39–117)
BUN: 12 mg/dL (ref 6–23)
Chloride: 109 mEq/L (ref 96–112)
Creatinine, Ser: 0.94 mg/dL (ref 0.50–1.35)
GFR calc Af Amer: >90 mL/min (ref >90)
Glucose, Bld: 86 mg/dL (ref 70–99)
Potassium: 4.2 mEq/L (ref 3.5–5.1)
Total Bilirubin: 0.4 mg/dL (ref 0.3–1.2)

## 2012-08-18 LAB — HEPATIC FUNCTION PANEL
ALT: 53 U/L (ref 0–53)
AST: 34 U/L (ref 0–37)
Alkaline Phosphatase: 83 U/L (ref 39–117)
Bilirubin, Direct: <0.1 mg/dL (ref 0.0–0.3)
Total Protein: 5.6 g/dL — ABNORMAL LOW (ref 6.0–8.3)

## 2012-08-18 LAB — OCCULT BLOOD X 1 CARD TO LAB, STOOL: Fecal Occult Bld: NEGATIVE

## 2012-08-18 LAB — CLOSTRIDIUM DIFFICILE BY PCR: Toxigenic C. Difficile by PCR: NEGATIVE

## 2012-08-18 MED ORDER — LEVALBUTEROL HCL 0.63 MG/3ML IN NEBU
0.6300 mg | INHALATION_SOLUTION | Freq: Four times a day (QID) | RESPIRATORY_TRACT | Status: DC | PRN
  Filled 2012-08-18: qty 3

## 2012-08-18 MED ORDER — SODIUM CHLORIDE 0.9 % IV SOLN
8.0000 mg/h | INTRAVENOUS | Status: DC
  Administered 2012-08-18 – 2012-08-19 (×2): 8 mg/h via INTRAVENOUS
  Filled 2012-08-18 (×4): qty 80

## 2012-08-18 MED ORDER — IOHEXOL 300 MG/ML  SOLN
100.0000 mL | Freq: Once | INTRAMUSCULAR | Status: AC | PRN
  Administered 2012-08-18: 100 mL via INTRAVENOUS

## 2012-08-18 MED ORDER — ENOXAPARIN SODIUM 40 MG/0.4ML ~~LOC~~ SOLN: 40.0000 mg | SUBCUTANEOUS | Status: DC

## 2012-08-18 MED ORDER — HYDROMORPHONE HCL PF 1 MG/ML IJ SOLN
0.5000 mg | INTRAMUSCULAR | Status: DC | PRN
  Administered 2012-08-18 – 2012-08-20 (×7): 0.5 mg via INTRAVENOUS
  Filled 2012-08-18 (×7): qty 1

## 2012-08-18 MED ORDER — SODIUM CHLORIDE 0.9 % IJ SOLN
3.0000 mL | Freq: Two times a day (BID) | INTRAMUSCULAR | Status: DC
  Administered 2012-08-18 – 2012-08-24 (×5): 3 mL via INTRAVENOUS

## 2012-08-18 MED ORDER — ACETAMINOPHEN 325 MG PO TABS
650.0000 mg | ORAL_TABLET | Freq: Four times a day (QID) | ORAL | Status: DC | PRN
  Administered 2012-08-18: 650 mg via ORAL
  Filled 2012-08-18: qty 2

## 2012-08-18 MED ORDER — PANTOPRAZOLE SODIUM 40 MG IV SOLR
40.0000 mg | INTRAVENOUS | Status: DC
  Administered 2012-08-18: 40 mg via INTRAVENOUS
  Filled 2012-08-18 (×2): qty 40

## 2012-08-18 MED ORDER — POTASSIUM CHLORIDE IN NACL 20-0.9 MEQ/L-% IV SOLN
INTRAVENOUS | Status: DC
  Administered 2012-08-18 – 2012-08-19 (×3): via INTRAVENOUS
  Administered 2012-08-20: 75 mL/h via INTRAVENOUS
  Administered 2012-08-20: 75 mL via INTRAVENOUS
  Administered 2012-08-22 – 2012-08-23 (×4): via INTRAVENOUS
  Filled 2012-08-18 (×14): qty 1000

## 2012-08-18 MED ORDER — ONDANSETRON HCL 4 MG/2ML IJ SOLN
4.0000 mg | Freq: Four times a day (QID) | INTRAMUSCULAR | Status: DC | PRN
  Administered 2012-08-19 – 2012-08-24 (×9): 4 mg via INTRAVENOUS
  Filled 2012-08-18 (×9): qty 2

## 2012-08-18 MED ORDER — SODIUM CHLORIDE 0.9 % IV SOLN: 80.0000 mg | INTRAVENOUS | Status: DC

## 2012-08-18 MED ORDER — ENOXAPARIN SODIUM 40 MG/0.4ML ~~LOC~~ SOLN
40.0000 mg | SUBCUTANEOUS | Status: DC
  Administered 2012-08-18: 40 mg via SUBCUTANEOUS
  Filled 2012-08-18: qty 0.4

## 2012-08-18 MED ORDER — SODIUM CHLORIDE 0.9 % IV SOLN: INTRAVENOUS | Status: AC

## 2012-08-18 MED ORDER — ACETAMINOPHEN 650 MG RE SUPP: 650.0000 mg | Freq: Four times a day (QID) | RECTAL | Status: DC | PRN

## 2012-08-18 MED ORDER — ONDANSETRON HCL 4 MG PO TABS
4.0000 mg | ORAL_TABLET | Freq: Four times a day (QID) | ORAL | Status: DC | PRN
  Administered 2012-08-19 – 2012-08-21 (×4): 4 mg via ORAL
  Filled 2012-08-18 (×3): qty 1

## 2012-08-18 NOTE — Progress Notes (Signed)
INITIAL NUTRITION ASSESSMENT  DOCUMENTATION CODES Per approved criteria  -Not Applicable   INTERVENTION: 1.  Modify diet; per MD discretion based on pt tolerance. Pt currently on clear liquids due to ongoing nausea. Supplements to be considered if pt unable to advanced diet and nausea well controlled.  NUTRITION DIAGNOSIS: Inadequate oral intake related to nausea, diarrhea as evidenced by pt report.    Monitor:  1.  Food/Beverage; diet advancement with tolerance 2.  Gastrointestinal; resolve of nausea and diarrhea  Reason for Assessment: MST  62 y.o. male  Admitting Dx: Ileus  ASSESSMENT: Pt admitted with diarrhea.  Pt with family members with viral gastroenteritis.  Possible ileus.  Pt with 2 BMs today. Pt sleeping soundly at time of visit.  Question whether current wt reflects some degree of dehydration found on admission. RD to follow.   Height: Ht Readings from Last 1 Encounters:  08/18/12 6' (1.829 m)    Weight: Wt Readings from Last 1 Encounters:  08/18/12 170 lb (77.111 kg)    Ideal Body Weight: 184 lbs  % Ideal Body Weight: 92%  Wt Readings from Last 10 Encounters:  08/18/12 170 lb (77.111 kg)  06/08/11 182 lb 1.6 oz (82.6 kg)  01/02/08 190 lb 12.8 oz (86.546 kg)  10/24/07 187 lb 1.6 oz (84.868 kg)  07/14/07 197 lb (89.359 kg)    Usual Body Weight: 185 lbs  % Usual Body Weight: 92%  BMI:  Body mass index is 23.05 kg/(m^2).  Estimated Nutritional Needs: Kcal: 2100-2350 Protein: 84-100g Fluid: >2.0 L/day  Skin: intact  Diet Order: Clear Liquid  EDUCATION NEEDS: -No education needs identified at this time  No intake or output data in the 24 hours ending 08/18/12 1116  Last BM: 2/14  Labs:   Recent Labs Lab 08/17/12 1926 08/18/12 0545  NA 138  --   K 4.0  --   CL 101  --   CO2 27  --   BUN 27*  --   CREATININE 1.06 1.03  CALCIUM 8.9  --   MG  --  2.3  GLUCOSE 109*  --     CBG (last 3)  No results found for this basename:  GLUCAP,  in the last 72 hours  Scheduled Meds: . sodium chloride   Intravenous STAT  . enoxaparin (LOVENOX) injection  40 mg Subcutaneous Q24H  . sodium chloride  3 mL Intravenous Q12H    Continuous Infusions: . 0.9 % NaCl with KCl 20 mEq / L 75 mL/hr at 08/18/12 0900    Past Medical History  Diagnosis Date  . Pneumonia 2009  . 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 Mitral valve prolapse     does not see cardiologist for. last stress test 2003  . Hypercalcemia   . Arthritis   . GERD (gastroesophageal reflux disease)   . Hiatal hernia   . Lipoma     left forearm (3)  . Dysrhythmia     ":Due to MVP"  . Bronchitis     history of  . 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 Cholecystectomy  2002  . Mastoidectomy  1996  . Appendectomy  1984  . Cataract extraction      L and R eye  . Parathyroidectomy    . Nasal septum surgery      sinus surgery  . Total hip revision  06/14/2011    Procedure: TOTAL  HIP REVISION;  Surgeon: Nestor Lewandowsky;  Location: [facility] OR;  Service: Orthopedics;  Laterality: Left;  Left Acetabular  Hip Revision  . Joint replacement      Lt hip    Loyce Dys, MS RD LDN Clinical Inpatient Dietitian Pager: [phone] Weekend/After hours pager: [phone]

## 2012-08-18 NOTE — Progress Notes (Signed)
[facility] Note  Subjective: Interval History: has complaints abdominal pain in the epigastrium, sharp, waxing and waning, similar to previous episodes of gastritis.  He also continues to complain of mild diffuse abdominal pain and decreased appetite.  He has had 8 episodes of diarrhea in the past 24 hours.  He is tolerating jello and liquids.   Objective: Vital signs in last 24 hours: Temp:  [97.6 F (36.4 C)-98.4 F (36.9 C)] 98.4 F (36.9 C) (02/14 1300) Pulse Rate:  [72-93] 77 (02/14 1300) Resp:  [16-20] 18 (02/14 1300) BP: (120-138)/(68-86) 120/68 mmHg (02/14 1300) SpO2:  [98 %-100 %] 100 % (02/14 1300) FiO2 (%):  [21 %] 21 % (02/14 0501) Weight:  [170 lb (77.111 kg)] 170 lb (77.111 kg) (02/14 0700)  PE:  Gen: Alert, awake, no acute distress Eyes: EOMI, anicteric CVS: RR, NR, No murmur noted Pulm: CTAB, No wheezing Abd: Soft, tender diffusely, mild epigastric tenderness, slightly decreased bowel sounds, no guarding or mass MSK: ROM is full, no edema or cyanosis Neuro: Alert and oriented X 3  Results for orders placed during the hospital encounter of 08/17/12 (from the past 24 hour(s))  CBC WITH DIFFERENTIAL     Status: Abnormal   Collection Time    08/17/12  7:26 PM      Result Value Range   WBC 5.7  4.0 - 10.5 K/uL   RBC 5.35  4.22 - 5.81 MIL/uL   Hemoglobin 17.3 (*) 13.0 - 17.0 g/dL   HCT 16.1  09.6 - 04.5 %   MCV 90.7  78.0 - 100.0 fL   MCH 32.3  26.0 - 34.0 pg   MCHC 35.7  30.0 - 36.0 g/dL   RDW 40.9  81.1 - 91.4 %   Platelets 159  150 - 400 K/uL   Neutrophils Relative 62  43 - 77 %   Neutro Abs 3.5  1.7 - 7.7 K/uL   Lymphocytes Relative 24  12 - 46 %   Lymphs Abs 1.4  0.7 - 4.0 K/uL   Monocytes Relative 13 (*) 3 - 12 %   Monocytes Absolute 0.7  0.1 - 1.0 K/uL   Eosinophils Relative 1  0 - 5 %   Eosinophils Absolute 0.1  0.0 - 0.7 K/uL   Basophils Relative 0  0 - 1 %   Basophils Absolute 0.0  0.0 - 0.1 K/uL  COMPREHENSIVE METABOLIC PANEL     Status:  Abnormal   Collection Time    08/17/12  7:26 PM      Result Value Range   Sodium 138  135 - 145 mEq/L   Potassium 4.0  3.5 - 5.1 mEq/L   Chloride 101  96 - 112 mEq/L   CO2 27  19 - 32 mEq/L   Glucose, Bld 109 (*) 70 - 99 mg/dL   BUN 27 (*) 6 - 23 mg/dL   Creatinine, Ser 7.82  0.50 - 1.35 mg/dL   Calcium 8.9  8.4 - 95.6 mg/dL   Total Protein 6.9  6.0 - 8.3 g/dL   Albumin 3.8  3.5 - 5.2 g/dL   AST 47 (*) 0 - 37 U/L   ALT 66 (*) 0 - 53 U/L   Alkaline Phosphatase 104  39 - 117 U/L   Total Bilirubin 0.6  0.3 - 1.2 mg/dL   GFR calc non Af Amer 73 (*) >90 mL/min   GFR calc Af Amer 85 (*) >90 mL/min  LIPASE, BLOOD     Status: None  Collection Time    08/17/12  7:26 PM      Result Value Range   Lipase 16  11 - 59 U/L  URINALYSIS, ROUTINE W REFLEX MICROSCOPIC     Status: Abnormal   Collection Time    08/17/12  9:20 PM      Result Value Range   Color, Urine AMBER (*) YELLOW   APPearance CLOUDY (*) CLEAR   Specific Gravity, Urine 1.037 (*) 1.005 - 1.030   pH 6.0  5.0 - 8.0   Glucose, UA NEGATIVE  NEGATIVE mg/dL   Hgb urine dipstick NEGATIVE  NEGATIVE   Bilirubin Urine SMALL (*) NEGATIVE   Ketones, ur 15 (*) NEGATIVE mg/dL   Protein, ur 30 (*) NEGATIVE mg/dL   Urobilinogen, UA 1.0  0.0 - 1.0 mg/dL   Nitrite NEGATIVE  NEGATIVE   Leukocytes, UA TRACE (*) NEGATIVE  URINE MICROSCOPIC-ADD ON     Status: Abnormal   Collection Time    08/17/12  9:20 PM      Result Value Range   WBC, UA 3-6  <3 WBC/hpf   RBC / HPF 0-2  <3 RBC/hpf   Bacteria, UA RARE  RARE   Casts HYALINE CASTS (*) NEGATIVE   Urine-Other MUCOUS PRESENT    CBC     Status: Abnormal   Collection Time    08/18/12  5:45 AM      Result Value Range   WBC 4.9  4.0 - 10.5 K/uL   RBC 4.41  4.22 - 5.81 MIL/uL   Hemoglobin 14.0  13.0 - 17.0 g/dL   HCT 86.5  78.4 - 69.6 %   MCV 91.8  78.0 - 100.0 fL   MCH 31.7  26.0 - 34.0 pg   MCHC 34.6  30.0 - 36.0 g/dL   RDW 29.5  28.4 - 13.2 %   Platelets 141 (*) 150 - 400 K/uL   CREATININE, SERUM     Status: Abnormal   Collection Time    08/18/12  5:45 AM      Result Value Range   Creatinine, Ser 1.03  0.50 - 1.35 mg/dL   GFR calc non Af Amer 76 (*) >90 mL/min   GFR calc Af Amer 88 (*) >90 mL/min  HEPATIC FUNCTION PANEL     Status: Abnormal   Collection Time    08/18/12  5:45 AM      Result Value Range   Total Protein 5.6 (*) 6.0 - 8.3 g/dL   Albumin 3.1 (*) 3.5 - 5.2 g/dL   AST 34  0 - 37 U/L   ALT 53  0 - 53 U/L   Alkaline Phosphatase 83  39 - 117 U/L   Total Bilirubin 0.4  0.3 - 1.2 mg/dL   Bilirubin, Direct <4.4  0.0 - 0.3 mg/dL   Indirect Bilirubin NOT CALCULATED  0.3 - 0.9 mg/dL  MAGNESIUM     Status: None   Collection Time    08/18/12  5:45 AM      Result Value Range   Magnesium 2.3  1.5 - 2.5 mg/dL  CLOSTRIDIUM DIFFICILE BY PCR     Status: None   Collection Time    08/18/12  6:26 AM      Result Value Range   C difficile by pcr NEGATIVE  NEGATIVE    Studies/Results: Dg Abd 1 View  08/18/2012  *RADIOLOGY REPORT*  Clinical Data: Diffuse abdominal pain, nausea and diarrhea, history of exploratory abdominal surgery many years ago  ABDOMEN -  1 VIEW  Comparison: CT abdomen pelvis of 08/18/2012  Findings: An erect view of the abdomen was obtained.  There are dilated loops of small bowel with differential air-fluid levels, very suspicious for partial small bowel obstruction.  Some air is noted within the nondistended colon with contrast present primarily in the descending and rectosigmoid colon.  No free air is seen. Surgical clips are present in the right upper quadrant from prior cholecystectomy.  IMPRESSION: Suspect partial small bowel obstruction.  No free air.   Original Report Authenticated By: Dwyane Dee, M.D.    Ct Abdomen Pelvis W Contrast  08/18/2012  *RADIOLOGY REPORT*  Clinical Data: Abdominal pain.  CT ABDOMEN AND PELVIS WITH CONTRAST  Technique:  Multidetector CT imaging of the abdomen and pelvis was performed following the standard  protocol during bolus administration of intravenous contrast.  Contrast: OMNIPAQUE IOHEXOL 300 MG/ML  SOLN  Comparison: 08/17/2012 radiographs  Findings: Stable 6 x 4 mm left lower lobe pulmonary nodule, image 2 of series 3, no change in 2010, considered benign.  There is a small amount of orally administered contrast in the distal esophagus, suspicious for gastroesophageal reflux.  Small cyst noted in the lateral segment left hepatic lobe and in the right hepatic lobe, stable.  The pancreas, spleen, and adrenal glands appear unremarkable.  Mild enlargement the exophytic Bosniak category one cyst from the right kidney upper pole noted, currently measuring 3.0 x 2.2 cm.  Enlargement of the left kidney lower pole cyst, currently measuring 4.5 x 3.7 cm, noted.  This has simple characteristics.  Mild stranding adjacent to the lower pole of the left kidney, similar to prior.  No pathologic retroperitoneal or porta hepatis adenopathy is identified.  No pathologic pelvic adenopathy is identified.  Left hip implant noted.  Mildly dilated jejunum noted with air-fluid levels.  Progressive dilution of contrast column noted.  The dilated bowel measures up to about 4 cm in diameter; no significant or obvious transition point to nondilated small bowel observed.  Scattered small mesenteric lymph nodes are not pathologically enlarged by size criteria.  There are fluid levels in the descending colon favoring diarrheal process.  Appendix surgically absent.  Gallbladder surgically absent.  IMPRESSION:  1.  Air-fluid levels in the descending colon favoring diarrheal process. 2.  Mildly dilated loops of proximal small bowel, with progressive contrast dilution but with only a gradual transition to nondilated bowel.  Given the lack of a lead point, this may simply be due to ileus. 3.  Contrast in the distal esophagus, suspicious for gastroesophageal reflux.   Original Report Authenticated By: Gaylyn Rong, M.D.    Dg Abd  Acute W/chest  08/17/2012  *RADIOLOGY REPORT*  Clinical Data: Shortness of breath, vomiting, fever, abdominal pain and bloating.  ACUTE ABDOMEN SERIES (ABDOMEN 2 VIEW & CHEST 1 VIEW)  Comparison: Chest radiograph performed 06/08/2011, and pelvis radiograph performed 06/14/2011  Findings: The lungs are relatively well-aerated; there is mild elevation of the left hemidiaphragm.  Minimal bilateral airspace opacities could reflect mild pneumonia.  No pleural effusion or pneumothorax is seen.  The cardiomediastinal silhouette is within normal limits.  The visualized bowel gas pattern is nonspecific.  A few distended loops of small bowel are seen, measuring up to 4.5 cm in diameter, with an associated air-fluid level. However, these loops demonstrate gradual decompression, without evidence of obstruction. Residual air and fluid are noted within the colon.  No free intra- abdominal air is identified on the provided upright view.  Clips are noted  within the right upper quadrant, reflecting prior cholecystectomy.  No acute osseous abnormalities are seen; the sacroiliac joints are unremarkable in appearance. 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  There is partial sacralization of vertebral body L5 on the left side.  IMPRESSION:  1.  Minimal bilateral airspace opacities could reflect mild pneumonia. 2.  Nonspecific bowel gas pattern; few distended loops of small bowel measure up to 4.5 cm, with an associated air-fluid level. However, there is gradual decompression of these loops, without definite evidence of obstruction.  Residual air and fluid are also seen within the colon.  This could reflect some degree of dysmotility, or less likely a partial small bowel obstruction; would correlate with symptoms.   Original Report Authenticated By: Tonia Ghent, M.D.     Scheduled Meds: . sodium chloride   Intravenous STAT  . enoxaparin (LOVENOX) injection  40 mg Subcutaneous  Q24H  . pantoprazole (PROTONIX) IV  40 mg Intravenous Q24H  . sodium chloride  3 mL Intravenous Q12H   Continuous Infusions: . 0.9 % NaCl with KCl 20 mEq / L 75 mL/hr at 08/18/12 0900   PRN Meds:acetaminophen, acetaminophen, HYDROmorphone (DILAUDID) injection, levalbuterol, ondansetron (ZOFRAN) IV, ondansetron  Assessment/Plan:  1. Partial SBO - Repeat Abd Xray from today shows likely partial SBO, this is likely due to a viral gastroenteritis ? Due to family being sick as well; please note CT abdomen above as well, consistent with acute diarrheal illness.  As VSS and normal WBC, will not treat with Abx at this time - Continue clears only, continue no meds at this time - IV protonix started for epigastric pain - Bowel rest - Nausea control - Patient is not currently with nausea or vomiting and abdomen is soft.  - Repeat Abd Xray in the AM  2. Psychiatric disorder - Patient on wellbutrin, prozac and ritalin.  Held for today.  Consider restarting tomorrow if course stable or improved.   Code: Full Disposition: D/C home when medically stable.    LOS: 1 day   Hollister Wessler Pager [phone], if after 7pm please call hospitalist on call.

## 2012-08-18 NOTE — Progress Notes (Signed)
[facility] hospitalist progress note. Chief complaint. Blood mixed with stool. History of present illness. This 62 year old male admitted with epigastric pain and diarrhea. Hospital workup as indicated partial small bowel obstruction. Staff notes patient had a loose bowel movement which turned the toilet water to a pinkish color. The patient states he has a distant history of gastric ulcer. He had been having some stabbing epigastric pains and was started on Protonix IV recently. He states that his abdominal pain is unchanged. He continues to have loose stool about once every hour. Patient states he had been noting some pinkish discoloration with his stools earlier today though I don't believe this was reported. Vital signs. Temperature 98.4, pulse 77, respiration 18, blood pressure 120/68. O2 sats 100%. General appearance. Well-developed elderly male who is alert, cooperative, and in no distress. Cardiac. Rate and rhythm regular. Lungs. Breath sounds clear and equal. Abdomen. Soft with positive bowel sounds. There is some rebound tenderness. There is no guarding and no indication of an acute abdomen. Impression/plan. Diarrheal stool with small amount of bright red blood. I have discontinued the patient's Lovenox DVT prophylaxis and placed him on SCD. His last hemoglobin from this a.m. was 14.0. I will obtain a hemoglobin and hematocrit now and then every 6 hours until further notice to monitor. I've asked staff to obtain a specimen for occult blood card to confirm rectal bleeding. I discontinued the IV Protonix and initiated patient on Protonix drip. Nursing will monitor and notify of any further bleeding. I deferred a GI consult at this point to the discretion of the morning rounding M.D.

## 2012-08-18 NOTE — Progress Notes (Signed)
Utilization review completed. Travon Crochet, RN, BSN. 

## 2012-08-18 NOTE — H&P (Signed)
[facility] History and Physical  [name] WUJ:[id number] DOB: 01-23-1951 DOA: 08/17/2012  Referring physician:David Jackie Plum, MD  PCP: Emeterio Reeve, MD   Chief Complaint: Diarrhea  HPI:  62 y.o. male hx of GERD, mitral valve prolapse here with ab pain, diarrhea for 5 days,, vomiting, dehydration. The last 5 days he had some epigastric pain and felt nauseous. He has dyspepsia with any insulin to gastroesophageal reflux disease and Barrett's esophagus. Did decrease by mouth intake due to the nausea , he has had no vomiting. He also has several episodes of diarrhea but not passing gas since yesterday.He feels that he has some swollen lymph nodes in his neck. History of appendectomy and cholecystectomy 25 years ago. No history of small bowel obstruction. Her colonoscopy 2 years ago at either [facility] No fevers the wife has similar symptoms. He called his doctor and was sent in for evaluation because he has been having some dysuria and has decreased urine output. Denies any melena hematochezia hematemesis  He states that his grandson and his wife were sick with viral gastroenteritis      Review of Systems: negative for the following  Constitutional: Denies fever, chills, diaphoresis, appetite change and fatigue.  HEENT: Denies photophobia, eye pain, redness, hearing loss, ear pain, congestion, sore throat, rhinorrhea, sneezing, mouth sores, trouble swallowing, neck pain, neck stiffness and tinnitus.  Respiratory: Denies SOB, DOE, cough, chest tightness, and wheezing.  Cardiovascular: Denies chest pain, palpitations and leg swelling.  Gastrointestinal: Positive for nausea, abdominal pain and diarrhea Genitourinary: Denies dysuria, urgency, frequency, hematuria, flank pain and difficulty urinating.  Musculoskeletal: Denies myalgias, back pain, joint swelling, arthralgias and gait problem.  Skin: Denies pallor, rash and wound.  Neurological: Denies dizziness, seizures, syncope,  weakness, light-headedness, numbness and headaches.  Hematological: Denies adenopathy. Easy bruising, personal or family bleeding history  Psychiatric/Behavioral: Denies suicidal ideation, mood changes, confusion, nervousness, sleep disturbance and agitation       Past Medical History  Diagnosis Date  . Pneumonia 2009  . 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 Mitral valve prolapse     does not see cardiologist for. last stress test 2003  . Hypercalcemia   . Arthritis   . GERD (gastroesophageal reflux disease)   . Hiatal hernia   . Lipoma     left forearm (3)  . Dysrhythmia     ":Due to MVP"  . Bronchitis     history of  . 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 Cholecystectomy  2002  . Mastoidectomy  1996  . Appendectomy  1984  . Cataract extraction      L and R eye  . Parathyroidectomy    . Nasal septum surgery      sinus surgery  . Total hip revision  06/14/2011    Procedure: TOTAL HIP REVISION;  Surgeon: Nestor Lewandowsky;  Location: [facility] OR;  Service: Orthopedics;  Laterality: Left;  Left Acetabular  Hip Revision      Social History:  reports that he has quit smoking. His smoking use included Cigarettes. He has a 36 pack-year smoking history. He does not have any smokeless tobacco history on file. He reports that he uses illicit drugs (Marijuana). He reports that he does not drink alcohol.    Allergies  Allergen Reactions  . Ciprofloxacin Hcl Other (See Comments)    Burning in ear when drops were used  Family History  Problem Relation Age of Onset  . Cancer Father     lung  . Cancer Brother     prostate     Prior to Admission medications   Medication Sig Start Date End Date Taking? Authorizing Provider  buPROPion (WELLBUTRIN SR) 150 MG 12 hr tablet Take 300 mg by mouth 2 (two) times daily.   Yes Historical Provider, MD  Coenzyme Q10 (COQ-10) 200 MG CAPS Take 1 capsule by mouth  daily.   Yes Historical Provider, MD  FLUoxetine (PROZAC) 40 MG capsule Take 40 mg by mouth daily.     Yes Historical Provider, MD  methylphenidate (RITALIN) 10 MG tablet Take 10 mg by mouth 3 (three) times daily.     Yes Historical Provider, MD  Multiple Vitamin (MULTIVITAMIN) capsule Take 1 capsule by mouth daily.     Yes Historical Provider, MD  omeprazole (PRILOSEC) 20 MG capsule Take 40 mg by mouth daily.    Yes Historical Provider, MD  simvastatin (ZOCOR) 20 MG tablet Take 20 mg by mouth at bedtime.     Yes Historical Provider, MD     Physical Exam: Filed Vitals:   08/17/12 1924 08/17/12 2344  BP: 133/86 128/78  Pulse: 93 72  Temp: 97.6 F (36.4 C) 98.4 F (36.9 C)  TempSrc: Oral Oral  Resp: 16 18  SpO2: 99% 99%     Constitutional: Vital signs reviewed. Patient is a well-developed and well-nourished in no acute distress and cooperative with exam. Alert and oriented x3.  Head: Normocephalic and atraumatic  Ear: TM normal bilaterally  Mouth: no erythema or exudates, MMM  Eyes: PERRL, EOMI, conjunctivae normal, No scleral icterus.  Neck: Supple, Trachea midline normal ROM, No JVD, mass, thyromegaly, or carotid bruit present.  Cardiovascular: RRR, S1 normal, S2 normal, no MRG, pulses symmetric and intact bilaterally  Pulmonary/Chest: CTAB, no wheezes, rales, or rhonchi  Abdominal: Soft. Mild epigastric tenderness ,no rebound  , bowel sounds are normal, no masses, organomegaly, or guarding present.  GU: no CVA tenderness Musculoskeletal: No joint deformities, erythema, or stiffness, ROM full and no nontender Ext: no edema and no cyanosis, pulses palpable bilaterally (DP and PT)  Hematology: no cervical, inginal, or axillary adenopathy.  Neurological: A&O x3, Strenght is normal and symmetric bilaterally, cranial nerve II-XII are grossly intact, no focal motor deficit, sensory intact to light touch bilaterally.  Skin: Warm, dry and intact. No rash, cyanosis, or clubbing.   Psychiatric: Normal mood and affect. speech and behavior is normal. Judgment and thought content normal. Cognition and memory are normal.       Labs on Admission:    Basic Metabolic Panel:  Recent Labs Lab 08/17/12 1926  NA 138  K 4.0  CL 101  CO2 27  GLUCOSE 109*  BUN 27*  CREATININE 1.06  CALCIUM 8.9   Liver Function Tests:  Recent Labs Lab 08/17/12 1926  AST 47*  ALT 66*  ALKPHOS 104  BILITOT 0.6  PROT 6.9  ALBUMIN 3.8    Recent Labs Lab 08/17/12 1926  LIPASE 16   No results found for this basename: AMMONIA,  in the last 168 hours CBC:  Recent Labs Lab 08/17/12 1926  WBC 5.7  NEUTROABS 3.5  HGB 17.3*  HCT 48.5  MCV 90.7  PLT 159   Cardiac Enzymes: No results found for this basename: CKTOTAL, CKMB, CKMBINDEX, TROPONINI,  in the last 168 hours  BNP (last 3 results) No results found for this basename: PROBNP,  in the  last 8760 hours    CBG: No results found for this basename: GLUCAP,  in the last 168 hours  Radiological Exams on Admission: Ct Abdomen Pelvis W Contrast  08/18/2012  *RADIOLOGY REPORT*  Clinical Data: Abdominal pain.  CT ABDOMEN AND PELVIS WITH CONTRAST  Technique:  Multidetector CT imaging of the abdomen and pelvis was performed following the standard protocol during bolus administration of intravenous contrast.  Contrast: OMNIPAQUE IOHEXOL 300 MG/ML  SOLN  Comparison: 08/17/2012 radiographs  Findings: Stable 6 x 4 mm left lower lobe pulmonary nodule, image 2 of series 3, no change in 2010, considered benign.  There is a small amount of orally administered contrast in the distal esophagus, suspicious for gastroesophageal reflux.  Small cyst noted in the lateral segment left hepatic lobe and in the right hepatic lobe, stable.  The pancreas, spleen, and adrenal glands appear unremarkable.  Mild enlargement the exophytic Bosniak category one cyst from the right kidney upper pole noted, currently measuring 3.0 x 2.2 cm.   Enlargement of the left kidney lower pole cyst, currently measuring 4.5 x 3.7 cm, noted.  This has simple characteristics.  Mild stranding adjacent to the lower pole of the left kidney, similar to prior.  No pathologic retroperitoneal or porta hepatis adenopathy is identified.  No pathologic pelvic adenopathy is identified.  Left hip implant noted.  Mildly dilated jejunum noted with air-fluid levels.  Progressive dilution of contrast column noted.  The dilated bowel measures up to about 4 cm in diameter; no significant or obvious transition point to nondilated small bowel observed.  Scattered small mesenteric lymph nodes are not pathologically enlarged by size criteria.  There are fluid levels in the descending colon favoring diarrheal process.  Appendix surgically absent.  Gallbladder surgically absent.  IMPRESSION:  1.  Air-fluid levels in the descending colon favoring diarrheal process. 2.  Mildly dilated loops of proximal small bowel, with progressive contrast dilution but with only a gradual transition to nondilated bowel.  Given the lack of a lead point, this may simply be due to ileus. 3.  Contrast in the distal esophagus, suspicious for gastroesophageal reflux.   Original Report Authenticated By: Gaylyn Rong, M.D.    Dg Abd Acute W/chest  08/17/2012  *RADIOLOGY REPORT*  Clinical Data: Shortness of breath, vomiting, fever, abdominal pain and bloating.  ACUTE ABDOMEN SERIES (ABDOMEN 2 VIEW & CHEST 1 VIEW)  Comparison: Chest radiograph performed 06/08/2011, and pelvis radiograph performed 06/14/2011  Findings: The lungs are relatively well-aerated; there is mild elevation of the left hemidiaphragm.  Minimal bilateral airspace opacities could reflect mild pneumonia.  No pleural effusion or pneumothorax is seen.  The cardiomediastinal silhouette is within normal limits.  The visualized bowel gas pattern is nonspecific.  A few distended loops of small bowel are seen, measuring up to 4.5 cm in diameter,  with an associated air-fluid level. However, these loops demonstrate gradual decompression, without evidence of obstruction. Residual air and fluid are noted within the colon.  No free intra- abdominal air is identified on the provided upright view.  Clips are noted within the right upper quadrant, reflecting prior cholecystectomy.  No acute osseous abnormalities are seen; the sacroiliac joints are unremarkable in appearance. 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  There is partial sacralization of vertebral body L5 on the left side.  IMPRESSION:  1.  Minimal bilateral airspace opacities could reflect mild pneumonia. 2.  Nonspecific bowel gas pattern; few distended loops of small bowel measure  up to 4.5 cm, with an associated air-fluid level. However, there is gradual decompression of these loops, without definite evidence of obstruction.  Residual air and fluid are also seen within the colon.  This could reflect some degree of dysmotility, or less likely a partial small bowel obstruction; would correlate with symptoms.   Original Report Authenticated By: Tonia Ghent, M.D.     EKG: Independently reviewed.   Assessment/Plan Principal Problem:   Ileus Active Problems:   HYPERLIPIDEMIA   G E R D   1. Mild gastroenteritis with Diarrhea and dehydration, we'll send of C. difficile, stool studies: The patient probably has a viral gastroenteritis. Will admit for observation, hydration with IV fluids he appears to be clinically dehydrated. Will bring him in for observation. We'll keep him on a clear liquid diet. We'll repeat abdominal KUB in the morning. 2. Gastroesophageal reflux disease patient will be continued on Protonix 3. History of mitral valve prolapse asymptomatic from a cardiac standpoint  Code Status:   full Family Communication: bedside Disposition Plan: admit   Time spent: 70 mins   [facility] Pager  [phone]  If 7PM-7AM, please contact night-coverage [email or web address] Password [facility] 08/18/2012, 4:03 AM

## 2012-08-19 ENCOUNTER — Inpatient Hospital Stay (HOSPITAL_COMMUNITY): Payer: BC Managed Care – PPO

## 2012-08-19 DIAGNOSIS — E860 Dehydration: Secondary | ICD-10-CM

## 2012-08-19 LAB — OCCULT BLOOD X 1 CARD TO LAB, STOOL
Fecal Occult Bld: NEGATIVE
Fecal Occult Bld: NEGATIVE

## 2012-08-19 LAB — HEMOGLOBIN AND HEMATOCRIT, BLOOD: HCT: 40.6 % (ref 39.0–52.0)

## 2012-08-19 MED ORDER — METRONIDAZOLE IN NACL 5-0.79 MG/ML-% IV SOLN
500.0000 mg | Freq: Three times a day (TID) | INTRAVENOUS | Status: DC
  Administered 2012-08-19 – 2012-08-21 (×7): 500 mg via INTRAVENOUS
  Filled 2012-08-19 (×9): qty 100

## 2012-08-19 MED ORDER — DIPHENHYDRAMINE HCL 25 MG PO CAPS
25.0000 mg | ORAL_CAPSULE | Freq: Four times a day (QID) | ORAL | Status: DC | PRN
  Administered 2012-08-19 – 2012-08-22 (×3): 25 mg via ORAL
  Filled 2012-08-19 (×2): qty 1

## 2012-08-19 MED ORDER — CIPROFLOXACIN IN D5W 400 MG/200ML IV SOLN
400.0000 mg | Freq: Two times a day (BID) | INTRAVENOUS | Status: DC
  Administered 2012-08-19 – 2012-08-21 (×5): 400 mg via INTRAVENOUS
  Filled 2012-08-19 (×6): qty 200

## 2012-08-19 MED ORDER — PANTOPRAZOLE SODIUM 40 MG IV SOLR
40.0000 mg | Freq: Two times a day (BID) | INTRAVENOUS | Status: DC
  Administered 2012-08-19 – 2012-08-24 (×12): 40 mg via INTRAVENOUS
  Filled 2012-08-19 (×15): qty 40

## 2012-08-19 NOTE — Progress Notes (Signed)
[facility] Note  Subjective: Interval History: Has complaints abdominal pain in the epigastrium, sharp, waxing and waning, similar to previous episodes of gastritis.  He also continues to complain of mild diffuse abdominal pain and decreased appetite.  He has had 8 episodes of diarrhea in the past 24 hours. He is tolerating jello and liquids. Nursing staff and patient reported pinkish discoloration of his stools to the NP last night. He has had further episodes where he has noted frank bleeding mixed with clots. Because of the concern for rectal bleed- FOBT was obtained, H and H q6 were ordered.   Objective: Vital signs in last 24 hours: Temp:  [98.4 F (36.9 C)-98.8 F (37.1 C)] 98.8 F (37.1 C) (02/15 0500) Pulse Rate:  [75-77] 75 (02/15 0500) Resp:  [18] 18 (02/15 0500) BP: (120-130)/(68-74) 130/74 mmHg (02/15 0500) SpO2:  [100 %] 100 % (02/15 0500)  PE:  Gen: Alert, awake, no acute distress Eyes: EOMI, anicteric CVS: RR, NR, No murmur noted Pulm: CTAB, No wheezing Abd: Soft, tender diffusely, mild epigastric tenderness, slightly decreased bowel sounds, no guarding or mass MSK: ROM is full, no edema or cyanosis Neuro: Alert and oriented X 3  Results for orders placed during the hospital encounter of 08/17/12 (from the past 24 hour(s))  COMPREHENSIVE METABOLIC PANEL     Status: Abnormal   Collection Time    08/18/12  8:55 PM      Result Value Range   Sodium 145  135 - 145 mEq/L   Potassium 4.2  3.5 - 5.1 mEq/L   Chloride 109  96 - 112 mEq/L   CO2 28  19 - 32 mEq/L   Glucose, Bld 86  70 - 99 mg/dL   BUN 12  6 - 23 mg/dL   Creatinine, Ser 1.61  0.50 - 1.35 mg/dL   Calcium 8.0 (*) 8.4 - 10.5 mg/dL   Total Protein 5.9 (*) 6.0 - 8.3 g/dL   Albumin 3.3 (*) 3.5 - 5.2 g/dL   AST 36  0 - 37 U/L   ALT 54 (*) 0 - 53 U/L   Alkaline Phosphatase 97  39 - 117 U/L   Total Bilirubin 0.4  0.3 - 1.2 mg/dL   GFR calc non Af Amer 88 (*) >90 mL/min   GFR calc Af Amer >90  >90 mL/min   CBC     Status: Abnormal   Collection Time    08/18/12  8:55 PM      Result Value Range   WBC 6.3  4.0 - 10.5 K/uL   RBC 4.40  4.22 - 5.81 MIL/uL   Hemoglobin 13.9  13.0 - 17.0 g/dL   HCT 09.6  04.5 - 40.9 %   MCV 91.6  78.0 - 100.0 fL   MCH 31.6  26.0 - 34.0 pg   MCHC 34.5  30.0 - 36.0 g/dL   RDW 81.1  91.4 - 78.2 %   Platelets 131 (*) 150 - 400 K/uL  OCCULT BLOOD X 1 CARD TO LAB, STOOL     Status: None   Collection Time    08/18/12 10:11 PM      Result Value Range   Fecal Occult Bld NEGATIVE  NEGATIVE  HEMOGLOBIN AND HEMATOCRIT, BLOOD     Status: None   Collection Time    08/19/12  1:43 AM      Result Value Range   Hemoglobin 14.0  13.0 - 17.0 g/dL   HCT 95.6  21.3 - 08.6 %  OCCULT BLOOD X 1 CARD TO LAB, STOOL     Status: None   Collection Time    08/19/12  6:01 AM      Result Value Range   Fecal Occult Bld NEGATIVE  NEGATIVE  HEMOGLOBIN AND HEMATOCRIT, BLOOD     Status: None   Collection Time    08/19/12  8:45 AM      Result Value Range   Hemoglobin 13.8  13.0 - 17.0 g/dL   HCT 16.1  09.6 - 04.5 %    Studies/Results: Dg Abd 1 View  08/19/12: RADIOLOGY REPORT* Clinical Data: Evaluate small bowel obstruction .ABDOMEN - 1 VIEW  Comparison: Yesterday pain, Findings: Mildly distended small bowel loops, scattered across the abdomen, are improved. Contrast has passed out of the colon. Colon is decompressed. Post cholecystectomy. No obvious free intraperitoneal gas. Left total hip arthroplasty anatomically aligned.  IMPRESSION:  Improving partial small bowel obstruction pattern.   08/18/2012  *RADIOLOGY REPORT*  Clinical Data: Diffuse abdominal pain, nausea and diarrhea, history of exploratory abdominal surgery many years ago  ABDOMEN - 1 VIEW  Comparison: CT abdomen pelvis of 08/18/2012  Findings: An erect view of the abdomen was obtained.  There are dilated loops of small bowel with differential air-fluid levels, very suspicious for partial small bowel obstruction.  Some  air is noted within the nondistended colon with contrast present primarily in the descending and rectosigmoid colon.  No free air is seen. Surgical clips are present in the right upper quadrant from prior cholecystectomy.  IMPRESSION: Suspect partial small bowel obstruction.  No free air.   Original Report Authenticated By: Dwyane Dee, M.D.    Ct Abdomen Pelvis W Contrast  08/18/2012  *RADIOLOGY REPORT*  Clinical Data: Abdominal pain.  CT ABDOMEN AND PELVIS WITH CONTRAST  Technique:  Multidetector CT imaging of the abdomen and pelvis was performed following the standard protocol during bolus administration of intravenous contrast.  Contrast: OMNIPAQUE IOHEXOL 300 MG/ML  SOLN  Comparison: 08/17/2012 radiographs  Findings: Stable 6 x 4 mm left lower lobe pulmonary nodule, image 2 of series 3, no change in 2010, considered benign.  There is a small amount of orally administered contrast in the distal esophagus, suspicious for gastroesophageal reflux.  Small cyst noted in the lateral segment left hepatic lobe and in the right hepatic lobe, stable.  The pancreas, spleen, and adrenal glands appear unremarkable.  Mild enlargement the exophytic Bosniak category one cyst from the right kidney upper pole noted, currently measuring 3.0 x 2.2 cm.  Enlargement of the left kidney lower pole cyst, currently measuring 4.5 x 3.7 cm, noted.  This has simple characteristics.  Mild stranding adjacent to the lower pole of the left kidney, similar to prior.  No pathologic retroperitoneal or porta hepatis adenopathy is identified.  No pathologic pelvic adenopathy is identified.  Left hip implant noted.  Mildly dilated jejunum noted with air-fluid levels.  Progressive dilution of contrast column noted.  The dilated bowel measures up to about 4 cm in diameter; no significant or obvious transition point to nondilated small bowel observed.  Scattered small mesenteric lymph nodes are not pathologically enlarged by size criteria.   There are fluid levels in the descending colon favoring diarrheal process.  Appendix surgically absent.  Gallbladder surgically absent.  IMPRESSION:  1.  Air-fluid levels in the descending colon favoring diarrheal process. 2.  Mildly dilated loops of proximal small bowel, with progressive contrast dilution but with only a gradual transition to nondilated bowel.  Given  the lack of a lead point, this may simply be due to ileus. 3.  Contrast in the distal esophagus, suspicious for gastroesophageal reflux.   Original Report Authenticated By: Gaylyn Rong, M.D.           Scheduled Meds: . sodium chloride  3 mL Intravenous Q12H   Continuous Infusions: . 0.9 % NaCl with KCl 20 mEq / L 75 mL/hr at 08/18/12 1929  . pantoprozole (PROTONIX) infusion 8 mg/hr (08/19/12 0811)   PRN Meds:acetaminophen, acetaminophen, HYDROmorphone (DILAUDID) injection, levalbuterol, ondansetron (ZOFRAN) IV, ondansetron  Assessment/Plan:  1. Partial SBO - Repeat Abd Xray from today shows improving partial SBO, this is likely due to a viral gastroenteritis. Due to family being sick as well; please note CT abdomen above as well, consistent with acute diarrheal illness. With the concern of bright red bleeding per rectum, nurse practioner- ordered FOBT and q6 H and H. FOBT x 3 negative and H and H stable . He was also started on protonix drip.  - Continue clears only, continue no meds at this time - Bowel rest - Patient is not currently with nausea or vomiting and abdomen is soft. But continues to require iv pain medication every 6 hours. - D/C protonix drip. Switch back to IV protonix.  - Change H and H to q12h. I will consult GI for their assistance with evaluation of GI bleed and for possible colonoscopy. - I will start cipro and flagyl for dysentry type illness to cover for possible bacterial gastroenteritis.   2. Psychiatric disorder - Patient on wellbutrin, prozac and ritalin.  Held for today.   - May consider  restarting tomorrow if course stable or improved.  Code: Full Disposition: D/C home when medically stable.    LOS: 2 days   Stephaine Breshears  if after 7pm please call hospitalist on call.

## 2012-08-19 NOTE — Progress Notes (Signed)
Patient with itching and mild redness at IV site.  Also requests Psych medications from home be restarted. Dr Eben Burow notified and to put in orders.

## 2012-08-19 NOTE — Consult Note (Signed)
Subjective:   HPI  The patient is a 62 year old male who states that 6 days ago his grandson came home with a gastrointestinal viral infection. Both he and his wife caught it. The patient started to have nausea, and diarrhea. He persisted in having complaints of diarrhea until he started to feel that he was getting dehydrated and so he came to the hospital where he was subsequently admitted. He was also having some generalized abdominal discomfort. A CT scan of the abdomen was done which raised the suspicion of an ileus more so than a small bowel obstruction. Followup abdominal x-rays however have mentioned resolving small bowel obstruction. The patient continues to have diarrhea. He has been started empirically on Cipro and Flagyl. Stool for Clostridium difficile toxin was negative. He reported seeing blood in the stool yesterday and today, but the stool for occult blood both yesterday and today were negative. His hemoglobin and hematocrit have remained stable.  Review of Systems No chest pain or shortness of breath  Past Medical History  Diagnosis Date  . Pneumonia 2009  . 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 Mitral valve prolapse     does not see cardiologist for. last stress test 2003  . Hypercalcemia   . Arthritis   . GERD (gastroesophageal reflux disease)   . Hiatal hernia   . Lipoma     left forearm (3)  . Dysrhythmia     ":Due to MVP"  . Bronchitis     history of  . 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 Cholecystectomy  2002  . Mastoidectomy  1996  . Appendectomy  1984  . Cataract extraction      L and R eye  . Parathyroidectomy    . Nasal septum surgery      sinus surgery  . Total hip revision  06/14/2011    Procedure: TOTAL HIP REVISION;  Surgeon: Nestor Lewandowsky;  Location: [facility] OR;  Service: Orthopedics;  Laterality: Left;  Left Acetabular  Hip Revision  . Joint replacement      Lt hip   History    Social History  . Marital Status: Married    Spouse Name: N/A    Number of Children: N/A  . Years of Education: N/A   Occupational History  . Not on file.   Social History Main Topics  . Smoking status: Former Smoker -- 1.50 packs/day for 24 years    Types: Cigarettes  . Smokeless tobacco: Not on file     Comment: quit smoking in 1987  . Alcohol Use: No     Comment: none  . Drug Use: Yes    Special: Marijuana     Comment: college age  . Sexually Active: Not on file   Other Topics Concern  . Not on file   Social History Narrative  . No narrative on file   family history includes Cancer in his brother and father. Current facility-administered medications:0.9 % NaCl with KCl 20 mEq/ L  infusion, , Intravenous, Continuous, Richarda Overlie, MD, Last Rate: 75 mL/hr at 08/19/12 1143;  acetaminophen (TYLENOL) suppository 650 mg, 650 mg, Rectal, Q6H PRN, Richarda Overlie, MD;  acetaminophen (TYLENOL) tablet 650 mg, 650 mg, Oral, Q6H PRN, Richarda Overlie, MD, 650 mg at 08/18/12 1311;  ciprofloxacin (CIPRO) IVPB 400 mg, 400 mg, Intravenous, Q12H, Ankit Garg, MD HYDROmorphone (DILAUDID) injection 0.5 mg, 0.5 mg, Intravenous, Q4H PRN, Germain Osgood  Abrol, MD, 0.5 mg at 08/19/12 1136;  levalbuterol (XOPENEX) nebulizer solution 0.63 mg, 0.63 mg, Nebulization, Q6H PRN, Richarda Overlie, MD;  metroNIDAZOLE (FLAGYL) IVPB 500 mg, 500 mg, Intravenous, Q8H, Ankit Garg, MD, 500 mg at 08/19/12 1404;  ondansetron (ZOFRAN) injection 4 mg, 4 mg, Intravenous, Q6H PRN, Richarda Overlie, MD, 4 mg at 08/19/12 0120 ondansetron (ZOFRAN) tablet 4 mg, 4 mg, Oral, Q6H PRN, Richarda Overlie, MD, 4 mg at 08/19/12 0754;  pantoprazole (PROTONIX) injection 40 mg, 40 mg, Intravenous, Q12H, Ankit Garg, MD, 40 mg at 08/19/12 1407;  sodium chloride 0.9 % injection 3 mL, 3 mL, Intravenous, Q12H, Richarda Overlie, MD, 3 mL at 08/18/12 0815 Allergies  Allergen Reactions  . Ciprofloxacin Hcl Other (See Comments)    Burning in ear when drops were used      Objective:     BP 94/63  Pulse 76  Temp(Src) 98.1 F (36.7 C) (Oral)  Resp 16  Ht 6' (1.829 m)  Wt 77.111 kg (170 lb)  BMI 23.05 kg/m2  SpO2 100%  He is in no acute distress  Heart regular rhythm no murmurs  Lungs clear  Abdomen: Bowel sounds are present, the abdomen is not distended, there is mild generalized discomfort to palpation, but there is no rebound or guarding.  Laboratory No components found with this basename: d1      Assessment:     #1. Gastroenteritis  #2. I suspect that we are dealing with a slowly resolving ileus rather than a bowel obstruction. He has not had any vomiting. I suspect that the ileus was brought on by a gastroenteritis.  #3. The patient reported rectal bleeding, however stool for occult blood yesterday and today are negative.      Plan:     Continue supportive care with intravenous fluids and empiric antibiotics as he is currently on. Follow clinically. Lab Results  Component Value Date   HGB 13.8 08/19/2012   HGB 14.0 08/19/2012   HGB 13.9 08/18/2012   HCT 40.4 08/19/2012   HCT 40.3 08/19/2012   HCT 40.3 08/18/2012   ALKPHOS 97 08/18/2012   ALKPHOS 83 08/18/2012   ALKPHOS 104 08/17/2012   AST 36 08/18/2012   AST 34 08/18/2012   AST 47* 08/17/2012   ALT 54* 08/18/2012   ALT 53 08/18/2012   ALT 66* 08/17/2012

## 2012-08-20 LAB — HEMOGLOBIN AND HEMATOCRIT, BLOOD
HCT: 38.3 % — ABNORMAL LOW (ref 39.0–52.0)
Hemoglobin: 13.1 g/dL (ref 13.0–17.0)

## 2012-08-20 MED ORDER — METHYLPHENIDATE HCL 10 MG PO TABS
10.0000 mg | ORAL_TABLET | Freq: Three times a day (TID) | ORAL | Status: DC
  Administered 2012-08-23 – 2012-08-27 (×4): 10 mg via ORAL
  Filled 2012-08-20 (×9): qty 2

## 2012-08-20 MED ORDER — ALUM & MAG HYDROXIDE-SIMETH 200-200-20 MG/5ML PO SUSP
15.0000 mL | ORAL | Status: DC | PRN
  Administered 2012-08-20 – 2012-08-22 (×2): via ORAL
  Administered 2012-08-23: 15 mL via ORAL
  Administered 2012-08-24: 15:00:00 via ORAL
  Administered 2012-08-24: 15 mL via ORAL
  Filled 2012-08-20 (×5): qty 30

## 2012-08-20 MED ORDER — BUPROPION HCL ER (SR) 150 MG PO TB12
300.0000 mg | ORAL_TABLET | Freq: Two times a day (BID) | ORAL | Status: DC
  Administered 2012-08-20 – 2012-08-27 (×8): 300 mg via ORAL
  Filled 2012-08-20 (×16): qty 2

## 2012-08-20 MED ORDER — HYDROCODONE-ACETAMINOPHEN 5-325 MG PO TABS
1.0000 | ORAL_TABLET | ORAL | Status: DC | PRN
  Administered 2012-08-20 – 2012-08-22 (×4): 2 via ORAL
  Filled 2012-08-20 (×3): qty 2
  Filled 2012-08-20: qty 1
  Filled 2012-08-20: qty 2

## 2012-08-20 MED ORDER — FLUOXETINE HCL 20 MG PO CAPS
40.0000 mg | ORAL_CAPSULE | Freq: Every day | ORAL | Status: DC
  Administered 2012-08-20 – 2012-08-27 (×8): 40 mg via ORAL
  Filled 2012-08-20 (×8): qty 2

## 2012-08-20 NOTE — Progress Notes (Signed)
[facility] Progress Note  Subjective: Seems to be doing somewhat better today. Previously he was having diarrhea every hour but last night did not have a stool and in fact went to 12 hours without a bowel movement which occurred this morning and was loose, but he did not see blood in the stool. He still has some abdominal discomfort but it seems better. His abdomen does not seem distended to him. He denies vomiting.  Objective: Vital signs in last 24 hours: Temp:  [98.1 F (36.7 C)-99.4 F (37.4 C)] 98.5 F (36.9 C) (02/16 0955) Pulse Rate:  [76-80] 80 (02/16 0955) Resp:  [16-18] 18 (02/16 0955) BP: (94-122)/(63-71) 122/67 mmHg (02/16 0955) SpO2:  [97 %-100 %] 97 % (02/16 0955) Weight change:    PE  He is in no distress.  Abdomen: Bowel sounds are normal, not distended, soft, mild generalized discomfort to palpation Lab Results: Results for orders placed during the hospital encounter of 08/17/12 (from the past 24 hour(s))  HEMOGLOBIN AND HEMATOCRIT, BLOOD     Status: None   Collection Time    08/19/12  7:53 PM      Result Value Range   Hemoglobin 14.2  13.0 - 17.0 g/dL   HCT 44.0  10.2 - 72.5 %  HEMOGLOBIN AND HEMATOCRIT, BLOOD     Status: Abnormal   Collection Time    08/20/12  9:00 AM      Result Value Range   Hemoglobin 13.1  13.0 - 17.0 g/dL   HCT 36.6 (*) 44.0 - 34.7 %    Studies/Results: @RISRSLT24 @    Assessment: Gastroenteritis with probable ileus rather than bowel obstruction  Plan: Continue supportive care, follow clinically.    Quanasia Defino F 08/20/2012, 11:45 AM

## 2012-08-20 NOTE — Progress Notes (Signed)
[facility] Note  Subjective: Interval History: Patient's pain is much improved. He had pain medications at 11.30 last night. He did not have any BM until this morning which was after 12 hours. The BM was described as loose but no blood noted. His H and H has been stable. He is tolerating jello and liquids. Patient was started on his home meds today.  Objective: Vital signs in last 24 hours: Temp:  [98.1 F (36.7 C)-99.4 F (37.4 C)] 98.5 F (36.9 C) (02/16 0955) Pulse Rate:  [76-80] 80 (02/16 0955) Resp:  [16-18] 18 (02/16 0955) BP: (94-122)/(63-71) 122/67 mmHg (02/16 0955) SpO2:  [97 %-100 %] 97 % (02/16 0955)  PE:  Gen: Alert, awake, no acute distress Eyes: EOMI, anicteric CVS: RR, NR, No murmur noted Pulm: CTAB, No wheezing Abd: Soft, mild epigastric tenderness, Normal bowel sounds, no guarding or mass MSK: ROM is full, no edema or cyanosis Neuro: Alert and oriented X 3  Results for orders placed during the hospital encounter of 08/17/12 (from the past 24 hour(s))  HEMOGLOBIN AND HEMATOCRIT, BLOOD     Status: None   Collection Time    08/19/12  7:53 PM      Result Value Range   Hemoglobin 14.2  13.0 - 17.0 g/dL   HCT 16.1  09.6 - 04.5 %  HEMOGLOBIN AND HEMATOCRIT, BLOOD     Status: Abnormal   Collection Time    08/20/12  9:00 AM      Result Value Range   Hemoglobin 13.1  13.0 - 17.0 g/dL   HCT 40.9 (*) 81.1 - 91.4 %    Studies/Results: Dg Abd 1 View  08/19/12: RADIOLOGY REPORT* Clinical Data: Evaluate small bowel obstruction .ABDOMEN - 1 VIEW  Comparison: Yesterday pain, Findings: Mildly distended small bowel loops, scattered across the abdomen, are improved. Contrast has passed out of the colon. Colon is decompressed. Post cholecystectomy. No obvious free intraperitoneal gas. Left total hip arthroplasty anatomically aligned.  IMPRESSION:  Improving partial small bowel obstruction pattern.   08/18/2012  *RADIOLOGY REPORT*  Clinical Data: Diffuse abdominal  pain, nausea and diarrhea, history of exploratory abdominal surgery many years ago  ABDOMEN - 1 VIEW  Comparison: CT abdomen pelvis of 08/18/2012  Findings: An erect view of the abdomen was obtained.  There are dilated loops of small bowel with differential air-fluid levels, very suspicious for partial small bowel obstruction.  Some air is noted within the nondistended colon with contrast present primarily in the descending and rectosigmoid colon.  No free air is seen. Surgical clips are present in the right upper quadrant from prior cholecystectomy.  IMPRESSION: Suspect partial small bowel obstruction.  No free air.   Original Report Authenticated By: Dwyane Dee, M.D.    Ct Abdomen Pelvis W Contrast  08/18/2012  *RADIOLOGY REPORT*  Clinical Data: Abdominal pain.  CT ABDOMEN AND PELVIS WITH CONTRAST  Technique:  Multidetector CT imaging of the abdomen and pelvis was performed following the standard protocol during bolus administration of intravenous contrast.  Contrast: OMNIPAQUE IOHEXOL 300 MG/ML  SOLN  Comparison: 08/17/2012 radiographs  Findings: Stable 6 x 4 mm left lower lobe pulmonary nodule, image 2 of series 3, no change in 2010, considered benign.  There is a small amount of orally administered contrast in the distal esophagus, suspicious for gastroesophageal reflux.  Small cyst noted in the lateral segment left hepatic lobe and in the right hepatic lobe, stable.  The pancreas, spleen, and adrenal glands appear unremarkable.  Mild enlargement  the exophytic Bosniak category one cyst from the right kidney upper pole noted, currently measuring 3.0 x 2.2 cm.  Enlargement of the left kidney lower pole cyst, currently measuring 4.5 x 3.7 cm, noted.  This has simple characteristics.  Mild stranding adjacent to the lower pole of the left kidney, similar to prior.  No pathologic retroperitoneal or porta hepatis adenopathy is identified.  No pathologic pelvic adenopathy is identified.  Left hip implant  noted.  Mildly dilated jejunum noted with air-fluid levels.  Progressive dilution of contrast column noted.  The dilated bowel measures up to about 4 cm in diameter; no significant or obvious transition point to nondilated small bowel observed.  Scattered small mesenteric lymph nodes are not pathologically enlarged by size criteria.  There are fluid levels in the descending colon favoring diarrheal process.  Appendix surgically absent.  Gallbladder surgically absent.  IMPRESSION:  1.  Air-fluid levels in the descending colon favoring diarrheal process. 2.  Mildly dilated loops of proximal small bowel, with progressive contrast dilution but with only a gradual transition to nondilated bowel.  Given the lack of a lead point, this may simply be due to ileus. 3.  Contrast in the distal esophagus, suspicious for gastroesophageal reflux.   Original Report Authenticated By: Gaylyn Rong, M.D.           Scheduled Meds: . buPROPion  300 mg Oral BID  . ciprofloxacin  400 mg Intravenous Q12H  . FLUoxetine  40 mg Oral Daily  . methylphenidate  10 mg Oral TID  . metronidazole  500 mg Intravenous Q8H  . pantoprazole (PROTONIX) IV  40 mg Intravenous Q12H  . sodium chloride  3 mL Intravenous Q12H   Continuous Infusions: . 0.9 % NaCl with KCl 20 mEq / L 75 mL (08/20/12 0530)   PRN Meds:diphenhydrAMINE, HYDROcodone-acetaminophen, levalbuterol, ondansetron (ZOFRAN) IV, ondansetron  Assessment/Plan:  1. Probable Ileus: likely 2/2 viral gastroenteritis.  Due to family being sick as well; please note CT abdomen above as well, consistent with acute diarrheal illness. With the concern of bright red bleeding per rectum, GI consult was called yesterday. Appreciate their assistance! -  Continue clears only. -  Bowel rest -  Patient is not currently with nausea or vomiting and abdomen is soft. I will change IV pain meds to PO. -  IV protonix.  -  Change H and H to q24 hours. FOBT negative. No indication for  colonoscopy.  -  Continue cipro and flagyl -  IVF -  Check abd. xray tomorrow morning   2. Psychiatric disorder - Patient on wellbutrin, prozac and ritalin.    Code: Full  Disposition: D/C home when medically stable.    LOS: 3 days   Elliott Quade  if after 7pm please call hospitalist on call.

## 2012-08-21 ENCOUNTER — Inpatient Hospital Stay (HOSPITAL_COMMUNITY): Payer: BC Managed Care – PPO

## 2012-08-21 DIAGNOSIS — R110 Nausea: Secondary | ICD-10-CM

## 2012-08-21 DIAGNOSIS — S72009A Fracture of unspecified part of neck of unspecified femur, initial encounter for closed fracture: Secondary | ICD-10-CM

## 2012-08-21 DIAGNOSIS — F988 Other specified behavioral and emotional disorders with onset usually occurring in childhood and adolescence: Secondary | ICD-10-CM

## 2012-08-21 DIAGNOSIS — R109 Unspecified abdominal pain: Secondary | ICD-10-CM

## 2012-08-21 DIAGNOSIS — F329 Major depressive disorder, single episode, unspecified: Secondary | ICD-10-CM

## 2012-08-21 LAB — COMPREHENSIVE METABOLIC PANEL
ALT: 36 U/L (ref 0–53)
Albumin: 3.1 g/dL — ABNORMAL LOW (ref 3.5–5.2)
Alkaline Phosphatase: 88 U/L (ref 39–117)
Chloride: 102 mEq/L (ref 96–112)
GFR calc Af Amer: >90 mL/min (ref >90)
Glucose, Bld: 92 mg/dL (ref 70–99)
Potassium: 3 mEq/L — ABNORMAL LOW (ref 3.5–5.1)
Sodium: 141 mEq/L (ref 135–145)
Total Protein: 6.2 g/dL (ref 6.0–8.3)

## 2012-08-21 LAB — CBC WITH DIFFERENTIAL/PLATELET
Eosinophils Absolute: 0.2 10*3/uL (ref 0.0–0.7)
Lymphs Abs: 1 10*3/uL (ref 0.7–4.0)
MCH: 31.8 pg (ref 26.0–34.0)
Neutro Abs: 5.1 10*3/uL (ref 1.7–7.7)
Neutrophils Relative %: 75 % (ref 43–77)
Platelets: 169 10*3/uL (ref 150–400)
RBC: 4.49 MIL/uL (ref 4.22–5.81)
WBC: 6.9 10*3/uL (ref 4.0–10.5)

## 2012-08-21 MED ORDER — HYDROMORPHONE HCL PF 1 MG/ML IJ SOLN
0.5000 mg | Freq: Three times a day (TID) | INTRAMUSCULAR | Status: DC | PRN
  Administered 2012-08-21 – 2012-08-22 (×3): 0.5 mg via INTRAVENOUS
  Filled 2012-08-21 (×4): qty 1

## 2012-08-21 MED ORDER — POTASSIUM CHLORIDE CRYS ER 20 MEQ PO TBCR
40.0000 meq | EXTENDED_RELEASE_TABLET | Freq: Two times a day (BID) | ORAL | Status: AC
  Administered 2012-08-21 (×2): 40 meq via ORAL
  Filled 2012-08-21 (×2): qty 2

## 2012-08-21 MED ORDER — HYDROMORPHONE HCL PF 1 MG/ML IJ SOLN: 1.0000 mg | INTRAMUSCULAR | Status: DC | PRN

## 2012-08-21 NOTE — Progress Notes (Addendum)
Patient ID: [name], male   DOB: 1950-10-22, 62 y.o.   MRN: [id number]  [facility] PROGRESS NOTE  JEDREK DINOVO WUJ:[id number] DOB: 08/15/1950 DOA: 08/17/2012 PCP: Emeterio Reeve, MD  Brief narrative: 62 year old male with PMH of GERD, Barrett's esophagus, MVP, who states that 8 days prior to admission started to experience epigastric abdominal pain, nausea, and diarrhea without vomiting. He was hospitalized 02/14 for ? SBO and while in hospital has developed some blood in stool with diarrhea. GI has been consulted and since abdominal XRAY this AM 02/17 has shown worsening SBO, surgery was consulted for further recommendations.  Principal Problem:   Ileus - worsening SBO per XRAY - appreciate surgical input - keep NPO, IVF, analgesia and antiemetics as needed - repeat abdominal xray in AM - will discontinue Cipro and Flagyl as no indication for it currently, C> Diff negative and abdominal concerns mostly consistent with SBO Active Problems:  HYPOKALEMIA - supplement and repeat BMP in AM    HYPERLIPIDEMIA - stable medical issue  G E R D - continue protonix IV  Consultants:  GI  Surgery  Procedures/Studies: Dg Abd 1 View 08/21/2012    Increased gaseous distention of small bowel loops. Findings raise concern for worsening small bowel obstruction.     Antibiotics:  None  Code Status: Full Family Communication: Pt at bedside Disposition Plan: Home when medically stable  HPI/Subjective: No events overnight.   Objective: Filed Vitals:   08/20/12 0955 08/20/12 1403 08/20/12 2320 08/21/12 0629  BP: 122/67 102/60 110/60 105/65  Pulse: 80 73 74 53  Temp: 98.5 F (36.9 C) 98.2 F (36.8 C) 98.4 F (36.9 C) 98.6 F (37 C)  TempSrc:  Oral    Resp: 18 16 16 16   Height:      Weight:      SpO2: 97% 96% 96% 96%    Intake/Output Summary (Last 24 hours) at 08/21/12 1108 Last data filed at 08/21/12 0500  Gross per 24 hour  Intake    480 ml  Output     600 ml  Net   -120 ml    Exam:   General:  Pt is alert, follows commands appropriately, not in acute distress  Cardiovascular: Regular rate and rhythm, S1/S2, no murmurs, no rubs, no gallops  Respiratory: Clear to auscultation bilaterally, no wheezing, no crackles, no rhonchi  Abdomen: Soft, dlightly tender in epigastric area, mildly  distended, bowel sounds soft, no guarding  Extremities: No edema, pulses DP and PT palpable bilaterally  Neuro: Grossly nonfocal  Data Reviewed: Basic Metabolic Panel:  Recent Labs Lab 08/17/12 1926 08/18/12 0545 08/18/12 2055 08/21/12 0610  NA 138  --  145 141  K 4.0  --  4.2 3.0*  CL 101  --  109 102  CO2 27  --  28 32  GLUCOSE 109*  --  86 92  BUN 27*  --  12 8  CREATININE 1.06 1.03 0.94 0.98  CALCIUM 8.9  --  8.0* 8.5  MG  --  2.3  --   --    Liver Function Tests:  Recent Labs Lab 08/17/12 1926 08/18/12 0545 08/18/12 2055 08/21/12 0610  AST 47* 34 36 25  ALT 66* 53 54* 36  ALKPHOS 104 83 97 88  BILITOT 0.6 0.4 0.4 0.4  PROT 6.9 5.6* 5.9* 6.2  ALBUMIN 3.8 3.1* 3.3* 3.1*    Recent Labs Lab 08/17/12 1926  LIPASE 16   CBC:  Recent Labs Lab 08/17/12 1926 08/18/12  1610 08/18/12 2055 08/19/12 0143 08/19/12 0845 08/19/12 1953 08/20/12 0900 08/21/12 0610  WBC 5.7 4.9 6.3  --   --   --   --  6.9  NEUTROABS 3.5  --   --   --   --   --   --  5.1  HGB 17.3* 14.0 13.9 14.0 13.8 14.2 13.1 14.3  HCT 48.5 40.5 40.3 40.3 40.4 40.6 38.3* 40.7  MCV 90.7 91.8 91.6  --   --   --   --  90.6  PLT 159 141* 131*  --   --   --   --  169   Recent Results (from the past 240 hour(s))  CLOSTRIDIUM DIFFICILE BY PCR     Status: None   Collection Time    08/18/12  6:26 AM      Result Value Range Status   C difficile by pcr NEGATIVE  NEGATIVE Final     Scheduled Meds: . buPROPion  300 mg Oral BID  . ciprofloxacin  400 mg Intravenous Q12H  . FLUoxetine  40 mg Oral Daily  . methylphenidate  10 mg Oral TID  . metronidazole  500  mg Intravenous Q8H  . pantoprazole (PROTONIX) IV  40 mg Intravenous Q12H  . potassium chloride  40 mEq Oral BID  . sodium chloride  3 mL Intravenous Q12H   Continuous Infusions: . 0.9 % NaCl with KCl 20 mEq / L 75 mL/hr (08/20/12 2117)     Debbora Presto, MD  [facility] Pager [phone]  If 7PM-7AM, please contact night-coverage [email or web address] Password [email or web address] 08/21/2012, 11:08 AM   LOS: 4 days

## 2012-08-21 NOTE — Consult Note (Signed)
I have seen and examined the patient and agree with the assessment and plans.  Angeliki Mates A. Derak Schurman  MD, FACS  

## 2012-08-21 NOTE — Progress Notes (Signed)
[facility] Progress Note  Subjective: Abdomen feels bloated and distended, but less than earlier today. No vomiting. No more diahrrea today.   Objective: Vital signs in last 24 hours: Temp:  [98.2 F (36.8 C)-98.6 F (37 C)] 98.6 F (37 C) (02/17 0629) Pulse Rate:  [53-74] 53 (02/17 0629) Resp:  [16] 16 (02/17 0629) BP: (102-110)/(60-65) 105/65 mmHg (02/17 0629) SpO2:  [96 %] 96 % (02/17 0629) Weight change:    PE: In no distress Abdomen: some bowel sounds. Tympanitic. Mild discomfort to palpation  Abdominal xray yesterday showed more gaseous distention of small bowel. Lab Results: Results for orders placed during the hospital encounter of 08/17/12 (from the past 24 hour(s))  CBC WITH DIFFERENTIAL     Status: None   Collection Time    08/21/12  6:10 AM      Result Value Range   WBC 6.9  4.0 - 10.5 K/uL   RBC 4.49  4.22 - 5.81 MIL/uL   Hemoglobin 14.3  13.0 - 17.0 g/dL   HCT 32.4  40.1 - 02.7 %   MCV 90.6  78.0 - 100.0 fL   MCH 31.8  26.0 - 34.0 pg   MCHC 35.1  30.0 - 36.0 g/dL   RDW 25.3  66.4 - 40.3 %   Platelets 169  150 - 400 K/uL   Neutrophils Relative 75  43 - 77 %   Neutro Abs 5.1  1.7 - 7.7 K/uL   Lymphocytes Relative 15  12 - 46 %   Lymphs Abs 1.0  0.7 - 4.0 K/uL   Monocytes Relative 7  3 - 12 %   Monocytes Absolute 0.5  0.1 - 1.0 K/uL   Eosinophils Relative 3  0 - 5 %   Eosinophils Absolute 0.2  0.0 - 0.7 K/uL   Basophils Relative 0  0 - 1 %   Basophils Absolute 0.0  0.0 - 0.1 K/uL  COMPREHENSIVE METABOLIC PANEL     Status: Abnormal   Collection Time    08/21/12  6:10 AM      Result Value Range   Sodium 141  135 - 145 mEq/L   Potassium 3.0 (*) 3.5 - 5.1 mEq/L   Chloride 102  96 - 112 mEq/L   CO2 32  19 - 32 mEq/L   Glucose, Bld 92  70 - 99 mg/dL   BUN 8  6 - 23 mg/dL   Creatinine, Ser 4.74  0.50 - 1.35 mg/dL   Calcium 8.5  8.4 - 25.9 mg/dL   Total Protein 6.2  6.0 - 8.3 g/dL   Albumin 3.1 (*) 3.5 - 5.2 g/dL   AST 25  0 - 37 U/L   ALT  36  0 - 53 U/L   Alkaline Phosphatase 88  39 - 117 U/L   Total Bilirubin 0.4  0.3 - 1.2 mg/dL   GFR calc non Af Amer 86 (*) >90 mL/min   GFR calc Af Amer >90  >90 mL/min    Studies/Results: @RISRSLT24 @    Assessment: Gastroenteritis symptoms resolving. Ileus vs small bowel obstruction  Plan: Surgical opinion as to whether they think we are dealing with a SBO.    Graylin Shiver 08/21/2012, 12:28 PM

## 2012-08-21 NOTE — Consult Note (Signed)
[name] December 14, 1950  [id number].    Requesting MD: Dr. Evette Cristal (GI) Chief Complaint/Reason for Consult: Possible pSBO, worsening abdominal pain/distension HPI:  62 year old male with PMH of GERD, Barrett's esophagus, MVP, who states that 8 days ago his grandson came home with a gastrointestinal viral infection. Both he and his wife caught it, but his wife's resolved. The patient started to have epigastric abdominal pain, nausea, and diarrhea without vomiting. He persisted in having complaints of diarrhea until he started to feel that he was getting dehydrated, and so he came to the hospital where he was subsequently admitted on 08/18/12.   He notes prior to getting gastroenteritis he has had worsening symptoms of acid reflux including epigastric pain especially at night.  He is currently taking his Prilosec before bedtime (instead of 30-56min before a meal) because he thought it would help his symptoms at night.  He's also needed to take his PRN H2 blockers more often.  He also noted that over the last 6 months he has had worsening constipation and straining with BM's.  He notes a couple of occasions where he almost passed out due to straining.  He's noted multiple bloody BM's prior to coming to the hospital and while in the hospital, but occults were negative.  BM's have since changed to a dark oily color.    A CT scan of the abdomen was done which raised the suspicion of an ileus more so than a small bowel obstruction.  Followup abdominal x-rays however have mentioned resolving small bowel obstruction. The patient continues to have diarrhea, abdominal pain which has not improved.  Today he had increasing abdominal distension and worsening nausea.  He also notes he hasn't had a good BM today and gas is lessening.  He has been started empirically on Cipro and Flagyl.  Stool for Clostridium difficile toxin was negative.   His hemoglobin and hematocrit have remained stable.    PSH:  Appendectomy >30  years ago & cholecystectomy >20 years ago.  No h/o SBO.  Family History  Problem Relation Age of Onset  . Cancer Father     lung  . Cancer Brother     prostate    Past Medical History  Diagnosis Date  . Pneumonia 2009  . 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 Mitral valve prolapse     does not see cardiologist for. last stress test 2003  . Hypercalcemia   . Arthritis   . GERD (gastroesophageal reflux disease)   . Hiatal hernia   . Lipoma     left forearm (3)  . Dysrhythmia     ":Due to MVP"  . Bronchitis     history of  . 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 Cholecystectomy  2002  . Mastoidectomy  1996  . Appendectomy  1984  . Cataract extraction      L and R eye  . Parathyroidectomy    . Nasal septum surgery      sinus surgery  . Total hip revision  06/14/2011    Procedure: TOTAL HIP REVISION;  Surgeon: Nestor Lewandowsky;  Location: [facility] OR;  Service: Orthopedics;  Laterality: Left;  Left Acetabular  Hip Revision  . Joint replacement      Lt hip    Social History:  reports that he has quit smoking. His smoking use included Cigarettes. He has a 36 pack-year smoking history.  He does not have any smokeless tobacco history on file. He reports that he uses illicit drugs (Marijuana). He reports that he does not drink alcohol.  Allergies:  Allergies  Allergen Reactions  . Ciprofloxacin Hcl Other (See Comments)    Burning in ear when drops were used    Medications Prior to Admission  Medication Sig Dispense Refill  . buPROPion (WELLBUTRIN SR) 150 MG 12 hr tablet Take 300 mg by mouth 2 (two) times daily.      . Coenzyme Q10 (COQ-10) 200 MG CAPS Take 1 capsule by mouth daily.      Marland Kitchen FLUoxetine (PROZAC) 40 MG capsule Take 40 mg by mouth daily.        . methylphenidate (RITALIN) 10 MG tablet Take 10 mg by mouth 3 (three) times daily.        . Multiple Vitamin (MULTIVITAMIN) capsule Take 1 capsule by  mouth daily.        Marland Kitchen omeprazole (PRILOSEC) 20 MG capsule Take 40 mg by mouth daily.       . simvastatin (ZOCOR) 20 MG tablet Take 20 mg by mouth at bedtime.          Blood pressure 105/65, pulse 53, temperature 98.6 F (37 C), temperature source Oral, resp. rate 16, height 6' (1.829 m), weight 170 lb (77.111 kg), SpO2 96.00%. Physical Exam: General: pleasant, WD/WN white male who is laying in bed in NAD HEENT: head is normocephalic, atraumatic.  Sclera are noninjected.  PERRL.  Ears and nose without any masses or lesions.  Mouth is pink and moist Heart: regular, rate, and rhythm.  No obvious murmurs, gallops, or rubs noted.  Palpable pedal pulses bilaterally Lungs: CTAB, no wheezes, rhonchi, or rales noted.  Respiratory effort nonlabored Abd: soft, diffusely tender in all quadrants >epigastrum, +BS x4, no masses, hernias, or organomegaly, lower midline scar from appendectomy and few <1cm laparoscopic scars (umbilicus, RLQ visable) MS: all 4 extremities are symmetrical with no cyanosis, clubbing, or edema. Skin: warm and dry with no masses, lesions, or rashes Psych: A&Ox3 with an appropriate affect    Results for orders placed during the hospital encounter of 08/17/12 (from the past 48 hour(s))  HEMOGLOBIN AND HEMATOCRIT, BLOOD     Status: None   Collection Time    08/19/12  7:53 PM      Result Value Range   Hemoglobin 14.2  13.0 - 17.0 g/dL   HCT 16.1  09.6 - 04.5 %  HEMOGLOBIN AND HEMATOCRIT, BLOOD     Status: Abnormal   Collection Time    08/20/12  9:00 AM      Result Value Range   Hemoglobin 13.1  13.0 - 17.0 g/dL   HCT 40.9 (*) 81.1 - 91.4 %  CBC WITH DIFFERENTIAL     Status: None   Collection Time    08/21/12  6:10 AM      Result Value Range   WBC 6.9  4.0 - 10.5 K/uL   RBC 4.49  4.22 - 5.81 MIL/uL   Hemoglobin 14.3  13.0 - 17.0 g/dL   HCT 78.2  95.6 - 21.3 %   MCV 90.6  78.0 - 100.0 fL   MCH 31.8  26.0 - 34.0 pg   MCHC 35.1  30.0 - 36.0 g/dL   RDW 08.6  57.8 - 46.9 %    Platelets 169  150 - 400 K/uL   Neutrophils Relative 75  43 - 77 %   Neutro Abs 5.1  1.7 -  7.7 K/uL   Lymphocytes Relative 15  12 - 46 %   Lymphs Abs 1.0  0.7 - 4.0 K/uL   Monocytes Relative 7  3 - 12 %   Monocytes Absolute 0.5  0.1 - 1.0 K/uL   Eosinophils Relative 3  0 - 5 %   Eosinophils Absolute 0.2  0.0 - 0.7 K/uL   Basophils Relative 0  0 - 1 %   Basophils Absolute 0.0  0.0 - 0.1 K/uL  COMPREHENSIVE METABOLIC PANEL     Status: Abnormal   Collection Time    08/21/12  6:10 AM      Result Value Range   Sodium 141  135 - 145 mEq/L   Potassium 3.0 (*) 3.5 - 5.1 mEq/L   Chloride 102  96 - 112 mEq/L   CO2 32  19 - 32 mEq/L   Glucose, Bld 92  70 - 99 mg/dL   BUN 8  6 - 23 mg/dL   Creatinine, Ser 9.60  0.50 - 1.35 mg/dL   Calcium 8.5  8.4 - 45.4 mg/dL   Total Protein 6.2  6.0 - 8.3 g/dL   Albumin 3.1 (*) 3.5 - 5.2 g/dL   AST 25  0 - 37 U/L   ALT 36  0 - 53 U/L   Alkaline Phosphatase 88  39 - 117 U/L   Total Bilirubin 0.4  0.3 - 1.2 mg/dL   GFR calc non Af Amer 86 (*) >90 mL/min   GFR calc Af Amer >90  >90 mL/min   Comment:            The eGFR has been calculated     using the CKD EPI equation.     This calculation has not been     validated in all clinical     situations.     eGFR's persistently     <90 mL/min signify     possible Chronic Kidney Disease.   Dg Abd 1 View  08/21/2012  *RADIOLOGY REPORT*  Clinical Data: Partial small bowel obstruction.  ABDOMEN - 1 VIEW  Comparison: 08/19/2012  Findings: Supine view of the abdomen was obtained.  Again noted are dilated loops of small bowel throughout the upper abdomen.  There is increased small bowel gaseous distention, particularly in the right upper abdomen.  A small bowel loop now measures up to 5.7 cm. Again noted is a left hip arthroplasty.  Mild degenerative changes in the right hip. Minimal gas in the colon.  IMPRESSION: Increased gaseous distention of small bowel loops. Findings raise concern for worsening small bowel  obstruction.   Original Report Authenticated By: Richarda Overlie, M.D.        Assessment/Plan Gastroenteritis  Rectal bleeding? - occult neg GERD & h/o Barretts esophagus on Prilosec at home, educated patient about taking Prilosec 30-60 minutes before a meal or it won't work properly, he likely is not getting much of any effect from it taking it at bedtime Possible pSBO vs ileus worsened today since 2/15 on plain film 1.  Conservative management, NPO, IVF (may need to increase), pain control, antiemetics 2.  Ambulation and IS 3.  Repeat xray tomorrow, if not improved will need upper GI swallow 4.  If not improving in next 48-72 hours will possibly need OR for Ex Lap   DORT, Kamoni Depree 08/21/2012, 12:52 PM Pager: [phone]

## 2012-08-22 ENCOUNTER — Inpatient Hospital Stay (HOSPITAL_COMMUNITY): Payer: BC Managed Care – PPO

## 2012-08-22 LAB — BASIC METABOLIC PANEL
CO2: 26 mEq/L (ref 19–32)
Calcium: 8.6 mg/dL (ref 8.4–10.5)
Chloride: 107 mEq/L (ref 96–112)
Creatinine, Ser: 0.97 mg/dL (ref 0.50–1.35)
Glucose, Bld: 81 mg/dL (ref 70–99)

## 2012-08-22 LAB — CBC
Hemoglobin: 13.5 g/dL (ref 13.0–17.0)
MCH: 31 pg (ref 26.0–34.0)
MCV: 90.8 fL (ref 78.0–100.0)
Platelets: 175 10*3/uL (ref 150–400)
RBC: 4.35 MIL/uL (ref 4.22–5.81)
WBC: 7 10*3/uL (ref 4.0–10.5)

## 2012-08-22 MED ORDER — HYDROMORPHONE HCL PF 1 MG/ML IJ SOLN
0.5000 mg | INTRAMUSCULAR | Status: DC | PRN
  Administered 2012-08-22 – 2012-08-24 (×9): 0.5 mg via INTRAVENOUS
  Filled 2012-08-22 (×8): qty 1

## 2012-08-22 NOTE — Progress Notes (Signed)
NUTRITION FOLLOW UP  Intervention:   1. Modify diet; per MD discretion based on pt tolerance and resolve of altered GI function.  Nutrition Dx:   Inadequate oral intake, ongoing  Monitor:   1. Food/Beverage; diet advancement with tolerance  2. Gastrointestinal; resolve of nausea and diarrhea  Assessment:   Pt admitted with diarrhea, possible ileus.  Found to have SBO. Pt remains NPO, and is planning to have NGT placed for decompression.  Pt has been NPO x5 days.  Height: Ht Readings from Last 1 Encounters:  08/18/12 6' (1.829 m)    Weight Status:   Wt Readings from Last 1 Encounters:  08/18/12 170 lb (77.111 kg)    Re-estimated needs:  Kcal: 2100-2350 Protein: 84-100g Fluid: >2.0 L/day  Skin: wnl  Diet Order: NPO   Intake/Output Summary (Last 24 hours) at 08/22/12 1019 Last data filed at 08/22/12 0438  Gross per 24 hour  Intake 1697.5 ml  Output      0 ml  Net 1697.5 ml    Last BM: 2/16   Labs:   Recent Labs Lab 08/17/12 1926 08/18/12 0545 08/18/12 2055 08/21/12 0610 08/22/12 0602  NA 138  --  145 141 144  K 4.0  --  4.2 3.0* 4.4  CL 101  --  109 102 107  CO2 27  --  28 32 26  BUN 27*  --  12 8 12   CREATININE 1.06 1.03 0.94 0.98 0.97  CALCIUM 8.9  --  8.0* 8.5 8.6  MG  --  2.3  --   --   --   GLUCOSE 109*  --  86 92 81    CBG (last 3)  No results found for this basename: GLUCAP,  in the last 72 hours  Scheduled Meds: . buPROPion  300 mg Oral BID  . FLUoxetine  40 mg Oral Daily  . methylphenidate  10 mg Oral TID  . pantoprazole (PROTONIX) IV  40 mg Intravenous Q12H  . sodium chloride  3 mL Intravenous Q12H    Continuous Infusions: . 0.9 % NaCl with KCl 20 mEq / L 75 mL/hr at 08/22/12 0008    Loyce Dys, MS RD LDN Clinical Inpatient Dietitian Pager: [phone] Weekend/After hours pager: [phone]

## 2012-08-22 NOTE — Progress Notes (Signed)
Patient ID: [name], male   DOB: [birth year], 62 y.o.   MRN: [id number]    Subjective: Pt feels worse today.  More bloated.  Had crampy abdominal pain all night.  Burping a lot with some bilious taste to it.  1 episode of diarrhea this morning.  Objective: Vital signs in last 24 hours: Temp:  [97.9 F (36.6 C)-98.6 F (37 C)] 97.9 F (36.6 C) (02/18 0546) Pulse Rate:  [71-79] 79 (02/18 0546) Resp:  [16-18] 18 (02/18 0546) BP: (107-108)/(67-68) 107/68 mmHg (02/18 0546) SpO2:  [95 %-96 %] 96 % (02/18 0546) Last BM Date: 08/20/12  Intake/Output from previous day: 02/17 0701 - 02/18 0700 In: 1697.5 [I.V.:1697.5] Out: -  Intake/Output this shift:    PE: Abd: soft, more distended, hypoactive BS, minimally tender  Lab Results:   Recent Labs  08/21/12 0610 08/22/12 0602  WBC 6.9 7.0  HGB 14.3 13.5  HCT 40.7 39.5  PLT 169 175   BMET  Recent Labs  08/21/12 0610 08/22/12 0602  NA 141 144  K 3.0* 4.4  CL 102 107  CO2 32 26  GLUCOSE 92 81  BUN 8 12  CREATININE 0.98 0.97  CALCIUM 8.5 8.6   PT/INR No results found for this basename: LABPROT, INR,  in the last 72 hours CMP     Component Value Date/Time   NA 144 08/22/2012 0602   K 4.4 08/22/2012 0602   CL 107 08/22/2012 0602   CO2 26 08/22/2012 0602   GLUCOSE 81 08/22/2012 0602   BUN 12 08/22/2012 0602   CREATININE 0.97 08/22/2012 0602   CALCIUM 8.6 08/22/2012 0602   CALCIUM 9.0 02/19/2011 1233   PROT 6.2 08/21/2012 0610   ALBUMIN 3.1* 08/21/2012 0610   AST 25 08/21/2012 0610   ALT 36 08/21/2012 0610   ALKPHOS 88 08/21/2012 0610   BILITOT 0.4 08/21/2012 0610   GFRNONAA 87* 08/22/2012 0602   GFRAA >90 08/22/2012 0602   Lipase     Component Value Date/Time   LIPASE 16 08/17/2012 1926       Studies/Results: Dg Abd 1 View  08/21/2012  *RADIOLOGY REPORT*  Clinical Data: Partial small bowel obstruction.  ABDOMEN - 1 VIEW  Comparison: 08/19/2012  Findings: Supine view of the abdomen was obtained.  Again noted are  dilated loops of small bowel throughout the upper abdomen.  There is increased small bowel gaseous distention, particularly in the right upper abdomen.  A small bowel loop now measures up to 5.7 cm. Again noted is a left hip arthroplasty.  Mild degenerative changes in the right hip. Minimal gas in the colon.  IMPRESSION: Increased gaseous distention of small bowel loops. Findings raise concern for worsening small bowel obstruction.   Original Report Authenticated By: Richarda Overlie, M.D.    Dg Abd 2 Views  08/22/2012  *RADIOLOGY REPORT*  Clinical Data: Follow up small bowel obstruction/ileus  ABDOMEN - 2 VIEW  Comparison: 08/22/2015  Findings: Multiple dilated loops of small bowel in the central abdomen.  A loop of transverse colon is visualized on the upright radiograph, while the remainder of the colon is largely decompressed.  Multiple air fluid levels on the upright radiograph.  This overall appearance favors a small bowel obstruction, less likely adynamic ileus.  No evidence of free air under the diaphragm on the upright view.  Cholecystectomy clips.  Suspected small right pleural effusion.  Left hip arthroplasty.  IMPRESSION: Findings suggestive of small bowel obstruction, less likely adynamic ileus   Original  Report Authenticated By: Charline Bills, M.D.     Anti-infectives: Anti-infectives   Start     Dose/Rate Route Frequency Ordered Stop   08/19/12 1130  ciprofloxacin (CIPRO) IVPB 400 mg  Status:  Discontinued     400 mg 200 mL/hr over 60 Minutes Intravenous Every 12 hours 08/19/12 1100 08/21/12 1518   08/19/12 1130  metroNIDAZOLE (FLAGYL) IVPB 500 mg  Status:  Discontinued     500 mg 100 mL/hr over 60 Minutes Intravenous Every 8 hours 08/19/12 1100 08/21/12 1518   08/17/12 2200  cefTRIAXone (ROCEPHIN) 1 g in dextrose 5 % 50 mL IVPB     1 g 100 mL/hr over 30 Minutes Intravenous  Once 08/17/12 2156 08/17/12 2342       Assessment/Plan  1. PSBO 2. Gastroenteritis  Plan: 1. Due to  increase distention and increased crampy pain, will insert NGT for decompression 2. Will recheck x-rays in the morning. 3. Difficult to determine if this is all secondary to gastroenteritis or scar tissue.  Will follow.   LOS: 5 days    Shaterra Sanzone E 08/22/2012, 9:37 AM Pager: [phone]

## 2012-08-22 NOTE — Progress Notes (Signed)
[facility] Progress Note  Subjective: He started noticing that he was belching up some bile. Surgery has seen in her evaluation appreciated. Symptoms now are looking more like a small bowel obstruction. An NG tube is going to be placed.  Objective: Vital signs in last 24 hours: Temp:  [97.9 F (36.6 C)-98.6 F (37 C)] 97.9 F (36.6 C) (02/18 0546) Pulse Rate:  [71-79] 79 (02/18 0546) Resp:  [16-18] 18 (02/18 0546) BP: (107-108)/(67-68) 107/68 mmHg (02/18 0546) SpO2:  [95 %-96 %] 96 % (02/18 0546) Weight change:    PE:  Abdomen: Soft, minimal discomfort  Lab Results: Results for orders placed during the hospital encounter of 08/17/12 (from the past 24 hour(s))  CBC     Status: None   Collection Time    08/22/12  6:02 AM      Result Value Range   WBC 7.0  4.0 - 10.5 K/uL   RBC 4.35  4.22 - 5.81 MIL/uL   Hemoglobin 13.5  13.0 - 17.0 g/dL   HCT 16.1  09.6 - 04.5 %   MCV 90.8  78.0 - 100.0 fL   MCH 31.0  26.0 - 34.0 pg   MCHC 34.2  30.0 - 36.0 g/dL   RDW 40.9  81.1 - 91.4 %   Platelets 175  150 - 400 K/uL  BASIC METABOLIC PANEL     Status: Abnormal   Collection Time    08/22/12  6:02 AM      Result Value Range   Sodium 144  135 - 145 mEq/L   Potassium 4.4  3.5 - 5.1 mEq/L   Chloride 107  96 - 112 mEq/L   CO2 26  19 - 32 mEq/L   Glucose, Bld 81  70 - 99 mg/dL   BUN 12  6 - 23 mg/dL   Creatinine, Ser 7.82  0.50 - 1.35 mg/dL   Calcium 8.6  8.4 - 95.6 mg/dL   GFR calc non Af Amer 87 (*) >90 mL/min   GFR calc Af Amer >90  >90 mL/min    Studies/Results: @RISRSLT24 @    Assessment: Small bowel obstruction  Plan: NG tube. Patient now being followed by surgery also.    Graylin Shiver 08/22/2012, 12:11 PM

## 2012-08-22 NOTE — Progress Notes (Signed)
Patient ID: [name], male   DOB: [birth year], 62 y.o.   MRN: [id number]  [facility] PROGRESS NOTE  [name]:[id number] DOB: 08/27/1950 DOA: 08/17/2012 PCP: Emeterio Reeve, MD  Brief narrative:  62 year old male with PMH of GERD, Barrett's esophagus, MVP, who states that 8 days prior to admission started to experience epigastric abdominal pain, nausea, and diarrhea without vomiting. He was hospitalized 02/14 for ? SBO and while in hospital has developed some blood in stool with diarrhea. GI has been consulted and since abdominal XRAY this AM 02/17 has shown worsening SBO, surgery was consulted for further recommendations.   Principal Problem:  Small bowel obstruction  - worsening SBO per XRAY 2/17 ans no significant change on subsequent abdominal XRAY - appreciate surgical input  - keep NPO, IVF, analgesia and antiemetics as needed  - plan if for NGT placement today, attempt to decompress abdomen  - repeat abdominal xray in AM  - Cipro and Flagyl have been discontinued as C. Diff negative Active Problems:  Intermittent episodes of diarrhea - likely secondary to viral gastroenteritis - it appears to be improving per pt - will continue supportive care HYPOKALEMIA  - within normal limits this AM - will continue to supplement via IV route in combination with fluids - BMP in AM HYPERLIPIDEMIA  - stable medical issue  G E R D  - continue protonix IV   Consultants:  GI  Surgery  Procedures/Studies:   Dg Abd 1 View  08/21/2012 --> Increased gaseous distention of small bowel loops. Findings concern for worsening SBO.   Dg Abd 2 Views 08/22/2012 -->   Findings suggestive of small bowel obstruction, less likely adynamic ileus    Antibiotics:  Flagyl 2/14 --> 2/17 Ciprofloxacin 2/14 --> 2/17  Code Status: Full  Family Communication: Pt and wife at bedside  Disposition Plan: Home when medically stable   HPI/Subjective: No events overnight. Pt reports more  abdominal distension this AM, 2 small episodes of non bloody diarrhea.  Objective: Filed Vitals:   08/21/12 0629 08/21/12 1258 08/21/12 2029 08/22/12 0546  BP: 105/65 107/67 108/67 107/68  Pulse: 53 71 78 79  Temp: 98.6 F (37 C) 98.6 F (37 C) 98.3 F (36.8 C) 97.9 F (36.6 C)  TempSrc:   Oral Oral  Resp: 16 16 18 18   Height:      Weight:      SpO2: 96% 96% 95% 96%    Intake/Output Summary (Last 24 hours) at 08/22/12 1118 Last data filed at 08/22/12 0438  Gross per 24 hour  Intake 1697.5 ml  Output      0 ml  Net 1697.5 ml    Exam:   General:  Pt is alert, follows commands appropriately, not in acute distress  Cardiovascular: Regular rate and rhythm, S1/S2, no murmurs, no rubs, no gallops  Respiratory: Clear to auscultation bilaterally, no wheezing, no crackles, no rhonchi  Abdomen: Soft, tender in epigastric area, slightly distended, tympanic and very faint bowel sounds, no guarding  Extremities: No edema, pulses DP and PT palpable bilaterally  Neuro: Grossly nonfocal  Data Reviewed: Basic Metabolic Panel:  Recent Labs Lab 08/17/12 1926 08/18/12 0545 08/18/12 2055 08/21/12 0610 08/22/12 0602  NA 138  --  145 141 144  K 4.0  --  4.2 3.0* 4.4  CL 101  --  109 102 107  CO2 27  --  28 32 26  GLUCOSE 109*  --  86 92 81  BUN 27*  --  12 8 12   CREATININE 1.06 1.03 0.94 0.98 0.97  CALCIUM 8.9  --  8.0* 8.5 8.6  MG  --  2.3  --   --   --    Liver Function Tests:  Recent Labs Lab 08/17/12 1926 08/18/12 0545 08/18/12 2055 08/21/12 0610  AST 47* 34 36 25  ALT 66* 53 54* 36  ALKPHOS 104 83 97 88  BILITOT 0.6 0.4 0.4 0.4  PROT 6.9 5.6* 5.9* 6.2  ALBUMIN 3.8 3.1* 3.3* 3.1*    Recent Labs Lab 08/17/12 1926  LIPASE 16   CBC:  Recent Labs Lab 08/17/12 1926 08/18/12 0545 08/18/12 2055  08/19/12 0845 08/19/12 1953 08/20/12 0900 08/21/12 0610 08/22/12 0602  WBC 5.7 4.9 6.3  --   --   --   --  6.9 7.0  NEUTROABS 3.5  --   --   --   --   --    --  5.1  --   HGB 17.3* 14.0 13.9  < > 13.8 14.2 13.1 14.3 13.5  HCT 48.5 40.5 40.3  < > 40.4 40.6 38.3* 40.7 39.5  MCV 90.7 91.8 91.6  --   --   --   --  90.6 90.8  PLT 159 141* 131*  --   --   --   --  169 175  < > = values in this interval not displayed.   Recent Results (from the past 240 hour(s))  CLOSTRIDIUM DIFFICILE BY PCR     Status: None   Collection Time    08/18/12  6:26 AM      Result Value Range Status   C difficile by pcr NEGATIVE  NEGATIVE Final     Scheduled Meds: . buPROPion  300 mg Oral BID  . FLUoxetine  40 mg Oral Daily  . methylphenidate  10 mg Oral TID  . pantoprazole (PROTONIX) IV  40 mg Intravenous Q12H  . sodium chloride  3 mL Intravenous Q12H   Continuous Infusions: . 0.9 % NaCl with KCl 20 mEq / L 75 mL/hr at 08/22/12 0008     Debbora Presto, MD  [facility] Pager [phone]  If 7PM-7AM, please contact night-coverage [email or web address] Password [email or web address] 08/22/2012, 11:18 AM   LOS: 5 days

## 2012-08-22 NOTE — Progress Notes (Signed)
I have seen and examined the patient and agree with the assessment and plans. Given symptoms and exam, recommend NG placement  Izzac Rockett A. Magnus Ivan  MD, FACS

## 2012-08-23 ENCOUNTER — Inpatient Hospital Stay (HOSPITAL_COMMUNITY): Payer: BC Managed Care – PPO

## 2012-08-23 DIAGNOSIS — K219 Gastro-esophageal reflux disease without esophagitis: Secondary | ICD-10-CM

## 2012-08-23 DIAGNOSIS — K560 Paralytic ileus: Secondary | ICD-10-CM

## 2012-08-23 LAB — BASIC METABOLIC PANEL
BUN: 15 mg/dL (ref 6–23)
CO2: 22 mEq/L (ref 19–32)
Calcium: 7.9 mg/dL — ABNORMAL LOW (ref 8.4–10.5)
Chloride: 107 mEq/L (ref 96–112)
Creatinine, Ser: 0.84 mg/dL (ref 0.50–1.35)

## 2012-08-23 LAB — CBC
HCT: 38.5 % — ABNORMAL LOW (ref 39.0–52.0)
MCH: 31 pg (ref 26.0–34.0)
MCV: 91 fL (ref 78.0–100.0)
RBC: 4.23 MIL/uL (ref 4.22–5.81)
RDW: 12.5 % (ref 11.5–15.5)
WBC: 6.1 10*3/uL (ref 4.0–10.5)

## 2012-08-23 MED ORDER — MENTHOL 3 MG MT LOZG
1.0000 | LOZENGE | OROMUCOSAL | Status: DC | PRN
  Administered 2012-08-23: 3 mg via ORAL

## 2012-08-23 NOTE — Progress Notes (Signed)
Patient ID: [name], male   DOB: 04-01-1951, 62 y.o.   MRN: [id number]   LOS: 6 days   Subjective: Feels better after NGT. Denies flatus. No nausea unless he gets dilaudid. Pain tolerable.  Objective: Vital signs in last 24 hours: Temp:  [97.6 F (36.4 C)-98.7 F (37.1 C)] 97.6 F (36.4 C) (02/19 0622) Pulse Rate:  [80-88] 80 (02/19 0622) Resp:  [18] 18 (02/19 0622) BP: (124-130)/(62-74) 130/74 mmHg (02/19 0622) SpO2:  [94 %-96 %] 95 % (02/19 0622) Weight:  [166 lb 6 oz (75.467 kg)] 166 lb 6 oz (75.467 kg) (02/19 0500) Last BM Date: 08/22/12   NGT: bilious in canister (no recorded output)   Lab Results:  CBC  Recent Labs  08/22/12 0602 08/23/12 0445  WBC 7.0 6.1  HGB 13.5 13.1  HCT 39.5 38.5*  PLT 175 184   BMET  Recent Labs  08/22/12 0602 08/23/12 0445  NA 144 140  K 4.4 4.3  CL 107 107  CO2 26 22  GLUCOSE 81 67*  BUN 12 15  CREATININE 0.97 0.84  CALCIUM 8.6 7.9*    Radiology ABDOMEN - 2 VIEW  Comparison: Abdominal radiograph 08/22/2012.  Findings: There are multiple borderline dilated and mildly dilated  loops of small bowel, many of which demonstrate air fluid levels.  Paucity of colonic gas is noted. Surgical clips project over the  right upper quadrant of the abdomen, likely from prior  cholecystectomy. No definite pneumoperitoneum. Nasogastric tube  extends into the second portion of the duodenum. Postoperative  changes of left total hip arthroplasty are again noted.  IMPRESSION:  1. Findings are again concerning for a probable small bowel  obstruction (given the presence of some gas within the colon, this  is likely partial).  Original Report Authenticated By: Trudie Reed, M.D.   General appearance: alert and no distress Resp: clear to auscultation bilaterally Cardio: regular rate and rhythm GI: Soft, mild-mod diffuse TTP, no peritoneal signs, +BS, mild distension   Assessment/Plan: SBO -- Continue NPO, NGT and await  resolution   Freeman Caldron, PA-C Pager: [phone]    08/23/2012

## 2012-08-23 NOTE — Progress Notes (Signed)
I have seen and examined the patient and agree with the assessment and plans. Will give him 24 more hours with the NG.  I would do a UGI/SBFT before surgery.  Kadelyn Dimascio A. Magnus Ivan  MD, FACS

## 2012-08-23 NOTE — Progress Notes (Signed)
[facility] Progress Note  Subjective: Thinks his abdomen feels a little better today since the NG tube was placed, but the NG tube itself is miserable.  Objective: Vital signs in last 24 hours: Temp:  [97.6 F (36.4 C)-98.7 F (37.1 C)] 98.2 F (36.8 C) (02/19 0830) Pulse Rate:  [75-88] 75 (02/19 0830) Resp:  [14-18] 14 (02/19 0830) BP: (116-130)/(62-74) 116/64 mmHg (02/19 0830) SpO2:  [94 %-96 %] 95 % (02/19 0830) Weight:  [75.467 kg (166 lb 6 oz)] 75.467 kg (166 lb 6 oz) (02/19 0500) Weight change:    PE abdomen is soft, not distended, not tender Lab Results: Results for orders placed during the hospital encounter of 08/17/12 (from the past 24 hour(s))  CBC     Status: Abnormal   Collection Time    08/23/12  4:45 AM      Result Value Range   WBC 6.1  4.0 - 10.5 K/uL   RBC 4.23  4.22 - 5.81 MIL/uL   Hemoglobin 13.1  13.0 - 17.0 g/dL   HCT 16.1 (*) 09.6 - 04.5 %   MCV 91.0  78.0 - 100.0 fL   MCH 31.0  26.0 - 34.0 pg   MCHC 34.0  30.0 - 36.0 g/dL   RDW 40.9  81.1 - 91.4 %   Platelets 184  150 - 400 K/uL  BASIC METABOLIC PANEL     Status: Abnormal   Collection Time    08/23/12  4:45 AM      Result Value Range   Sodium 140  135 - 145 mEq/L   Potassium 4.3  3.5 - 5.1 mEq/L   Chloride 107  96 - 112 mEq/L   CO2 22  19 - 32 mEq/L   Glucose, Bld 67 (*) 70 - 99 mg/dL   BUN 15  6 - 23 mg/dL   Creatinine, Ser 7.82  0.50 - 1.35 mg/dL   Calcium 7.9 (*) 8.4 - 10.5 mg/dL   GFR calc non Af Amer >90  >90 mL/min   GFR calc Af Amer >90  >90 mL/min    Studies/Results: @[id number] @    Assessment: Small bowel obstruction  Plan: Continue NG suction for another 24 hours per surgery. Probable upper GI with small bowel follow-through once the symptoms improved.    Malaky Tetrault F 08/23/2012, 11:34 AM

## 2012-08-23 NOTE — Progress Notes (Signed)
[facility] PROGRESS NOTE  MURAT RIDEOUT WUJ:[id number] DOB: 1950-12-25 DOA: 08/17/2012 PCP: Emeterio Reeve, MD Brief Narrative: 62 year old male with PMH of GERD, Barrett's esophagus, MVP, who states that 8 days prior to admission started to experience epigastric abdominal pain, nausea, and diarrhea without vomiting. He was hospitalized 02/14 for ? SBO and while in hospital has developed some blood in stool with diarrhea. GI has been consulted and since abdominal XRAY this AM 02/17 has shown worsening SBO, surgery was consulted for further recommendations.   Assessment/Plan: Small bowel obstruction  - Worsening SBO per XRAY 2/17 and no significant change on subsequent abdominal XRAY. NG placed on 08/22/2012 for decompression, abdominal distention improved. - Appreciate surgical input  - Continue NPO, IVF, analgesia and antiemetics as needed  - Repeat abdominal xray today, results pending. - Cipro and Flagyl have been discontinued as C. Diff negative   Intermittent episodes of diarrhea  - Likely secondary to gastroenteritis, resolved. - Continue supportive care   HYPOKALEMIA  - Resolved with replacement.   HYPERLIPIDEMIA  - Stable medical issue   GERD  - Continue protonix IV   Consultants:  GI  Surgery  Procedures/Studies:  Dg Abd 1 View 08/21/2012 --> Increased gaseous distention of small bowel loops. Findings concern for worsening SBO.  Dg Abd 2 Views 08/22/2012 --> Findings suggestive of small bowel obstruction, less likely adynamic ileus   Antibiotics:  Flagyl 2/14 --> 2/17  Ciprofloxacin 2/14 --> 2/17  Code Status: Full  Family Communication: Pt and wife at bedside  Disposition Plan: Home when medically stable. Pending.  HPI/Subjective: Still having intermittent abdominal pain. Pain improved from yesterday with NG decompression.  Objective: Filed Vitals:   08/22/12 1400 08/22/12 2300 08/23/12 0500 08/23/12 0622  BP: 124/62 125/68  130/74  Pulse: 88 82   80  Temp: 98.7 F (37.1 C) 97.9 F (36.6 C)  97.6 F (36.4 C)  TempSrc: Oral     Resp: 18 18  18   Height:      Weight:   75.467 kg (166 lb 6 oz)   SpO2: 94% 96%  95%    Intake/Output Summary (Last 24 hours) at 08/23/12 0837 Last data filed at 08/22/12 1200  Gross per 24 hour  Intake      0 ml  Output    100 ml  Net   -100 ml   Filed Weights   08/18/12 0700 08/23/12 0500  Weight: 77.111 kg (170 lb) 75.467 kg (166 lb 6 oz)    Exam: Physical Exam: General: Awake, Oriented, No acute distress. HEENT: EOMI, NG in place. Neck: Supple CV: S1 and S2 Lungs: Clear to ascultation bilaterally Abdomen: Soft, Nontender, Nondistended, +bowel sounds. Ext: Good pulses. Trace edema.  Data Reviewed: Basic Metabolic Panel:  Recent Labs Lab 08/17/12 1926 08/18/12 0545 08/18/12 2055 08/21/12 0610 08/22/12 0602 08/23/12 0445  NA 138  --  145 141 144 140  K 4.0  --  4.2 3.0* 4.4 4.3  CL 101  --  109 102 107 107  CO2 27  --  28 32 26 22  GLUCOSE 109*  --  86 92 81 67*  BUN 27*  --  12 8 12 15   CREATININE 1.06 1.03 0.94 0.98 0.97 0.84  CALCIUM 8.9  --  8.0* 8.5 8.6 7.9*  MG  --  2.3  --   --   --   --    Liver Function Tests:  Recent Labs Lab 08/17/12 1926 08/18/12 0545 08/18/12 2055 08/21/12 9562  AST 47* 34 36 25  ALT 66* 53 54* 36  ALKPHOS 104 83 97 88  BILITOT 0.6 0.4 0.4 0.4  PROT 6.9 5.6* 5.9* 6.2  ALBUMIN 3.8 3.1* 3.3* 3.1*    Recent Labs Lab 08/17/12 1926  LIPASE 16   No results found for this basename: AMMONIA,  in the last 168 hours CBC:  Recent Labs Lab 08/17/12 1926 08/18/12 0545 08/18/12 2055  08/19/12 1953 08/20/12 0900 08/21/12 0610 08/22/12 0602 08/23/12 0445  WBC 5.7 4.9 6.3  --   --   --  6.9 7.0 6.1  NEUTROABS 3.5  --   --   --   --   --  5.1  --   --   HGB 17.3* 14.0 13.9  < > 14.2 13.1 14.3 13.5 13.1  HCT 48.5 40.5 40.3  < > 40.6 38.3* 40.7 39.5 38.5*  MCV 90.7 91.8 91.6  --   --   --  90.6 90.8 91.0  PLT 159 141* 131*  --   --    --  169 175 184  < > = values in this interval not displayed. Cardiac Enzymes: No results found for this basename: CKTOTAL, CKMB, CKMBINDEX, TROPONINI,  in the last 168 hours BNP (last 3 results) No results found for this basename: PROBNP,  in the last 8760 hours CBG: No results found for this basename: GLUCAP,  in the last 168 hours  Recent Results (from the past 240 hour(s))  CLOSTRIDIUM DIFFICILE BY PCR     Status: None   Collection Time    08/18/12  6:26 AM      Result Value Range Status   C difficile by pcr NEGATIVE  NEGATIVE Final     Studies: Dg Abd 2 Views  08/23/2012  *RADIOLOGY REPORT*  Clinical Data: Abdominal pain and nausea.  Evaluate for small bowel obstruction.  ABDOMEN - 2 VIEW  Comparison: Abdominal radiograph 08/22/2012.  Findings: There are multiple borderline dilated and mildly dilated loops of small bowel, many of which demonstrate air fluid levels. Paucity of colonic gas is noted.  Surgical clips project over the right upper quadrant of the abdomen, likely from prior cholecystectomy.  No definite pneumoperitoneum.  Nasogastric tube extends into the second portion of the duodenum.  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  IMPRESSION: 1.  Findings are again concerning for a probable small bowel obstruction (given the presence of some gas within the colon, this is likely partial).   Original Report Authenticated By: Trudie Reed, M.D.    Dg Abd 2 Views  08/22/2012  *RADIOLOGY REPORT*  Clinical Data: Follow up small bowel obstruction/ileus  ABDOMEN - 2 VIEW  Comparison: 08/22/2015  Findings: Multiple dilated loops of small bowel in the central abdomen.  A loop of transverse colon is visualized on the upright radiograph, while the remainder of the colon is largely decompressed.  Multiple air fluid levels on the upright radiograph.  This overall appearance favors a small bowel obstruction, less likely adynamic ileus.  No evidence of free air  under the diaphragm on the upright view.  Cholecystectomy clips.  Suspected small right pleural effusion.  Left hip arthroplasty.  IMPRESSION: Findings suggestive of small bowel obstruction, less likely adynamic ileus   Original Report Authenticated By: Charline Bills, M.D.     Scheduled Meds: . buPROPion  300 mg Oral BID  . FLUoxetine  40 mg Oral Daily  . methylphenidate  10 mg Oral TID  . pantoprazole (PROTONIX)  IV  40 mg Intravenous Q12H  . sodium chloride  3 mL Intravenous Q12H   Continuous Infusions: . 0.9 % NaCl with KCl 20 mEq / L 75 mL/hr at 08/23/12 1610    Principal Problem:   Ileus Active Problems:   Agnes Lawrence, MD  [facility] Pager [phone]. If 7PM-7AM, please contact night-coverage at [email or web address], password [facility] 08/23/2012, 8:37 AM  LOS: 6 days

## 2012-08-24 ENCOUNTER — Inpatient Hospital Stay (HOSPITAL_COMMUNITY): Payer: BC Managed Care – PPO

## 2012-08-24 LAB — BASIC METABOLIC PANEL
CO2: 20 mEq/L (ref 19–32)
Calcium: 8.4 mg/dL (ref 8.4–10.5)
Creatinine, Ser: 0.8 mg/dL (ref 0.50–1.35)
GFR calc non Af Amer: >90 mL/min (ref >90)
Glucose, Bld: 61 mg/dL — ABNORMAL LOW (ref 70–99)

## 2012-08-24 MED ORDER — POTASSIUM CHLORIDE 2 MEQ/ML IV SOLN
INTRAVENOUS | Status: DC
  Administered 2012-08-24 – 2012-08-27 (×3): via INTRAVENOUS
  Filled 2012-08-24 (×5): qty 1000

## 2012-08-24 MED ORDER — IOHEXOL 300 MG/ML  SOLN
300.0000 mL | Freq: Once | INTRAMUSCULAR | Status: AC | PRN
  Administered 2012-08-24: 300 mL

## 2012-08-24 NOTE — Progress Notes (Signed)
[facility] PROGRESS NOTE  [name] ZOX:[id number] DOB: Mar 27, 1951 DOA: 08/17/2012 PCP: Emeterio Reeve, MD Brief Narrative: 62 year old male with PMH of GERD, Barrett's esophagus, MVP, who states that 8 days prior to admission started to experience epigastric abdominal pain, nausea, and diarrhea without vomiting. He was hospitalized 02/14 for ? SBO and while in hospital has developed some blood in stool with diarrhea. GI has been consulted and since abdominal XRAY this AM 02/17 has shown worsening SBO, surgery was consulted for further recommendations.   Assessment/Plan: Small bowel obstruction  - NG placed on 08/22/2012 for decompression, abdominal distention improved. - Continue NPO, IVF, analgesia and antiemetics as needed  - Upper GI and small bowel follow-through ordered for today. - Cipro and Flagyl have been discontinued as C. Diff negative   Intermittent episodes of diarrhea  - Likely secondary to gastroenteritis. - Continue supportive care   HYPOKALEMIA  - Resolved with replacement.   HYPERLIPIDEMIA  - Stable medical issue   GERD  - Continue protonix IV   Consultants:  GI  Surgery  Procedures/Studies:  Dg Abd 1 View 08/21/2012 --> Increased gaseous distention of small bowel loops. Findings concern for worsening SBO.  Dg Abd 2 Views 08/22/2012 --> Findings suggestive of small bowel obstruction, less likely adynamic ileus   Antibiotics:  Flagyl 2/14 --> 2/17  Ciprofloxacin 2/14 --> 2/17  Code Status: Full  Family Communication: Pt and wife at bedside  Disposition Plan: Home when medically stable. Pending.  HPI/Subjective: Abdominal pain and distention improved.  NG uncomfortable.  Objective: Filed Vitals:   08/23/12 0830 08/23/12 1416 08/23/12 2100 08/24/12 0500  BP: 116/64 109/55 110/69 112/88  Pulse: 75 71 70 64  Temp: 98.2 F (36.8 C) 98.6 F (37 C) 98.6 F (37 C) 97.9 F (36.6 C)  TempSrc: Oral     Resp: 14 16 18 18   Height:       Weight:    75.6 kg (166 lb 10.7 oz)  SpO2: 95% 95% 96% 99%    Intake/Output Summary (Last 24 hours) at 08/24/12 0913 Last data filed at 08/24/12 0600  Gross per 24 hour  Intake   1800 ml  Output   2300 ml  Net   -500 ml   Filed Weights   08/18/12 0700 08/23/12 0500 08/24/12 0500  Weight: 77.111 kg (170 lb) 75.467 kg (166 lb 6 oz) 75.6 kg (166 lb 10.7 oz)    Exam: Physical Exam: General: Awake, Oriented, No acute distress. HEENT: EOMI, NG in place. Neck: Supple CV: S1 and S2 Lungs: Clear to ascultation bilaterally Abdomen: Soft, Nontender, Nondistended, +bowel sounds. Ext: Good pulses. Trace edema.  Data Reviewed: Basic Metabolic Panel:  Recent Labs Lab 08/17/12 1926 08/18/12 0545 08/18/12 2055 08/21/12 0610 08/22/12 0602 08/23/12 0445 08/24/12 0631  NA 138  --  145 141 144 140 138  K 4.0  --  4.2 3.0* 4.4 4.3 4.4  CL 101  --  109 102 107 107 105  CO2 27  --  28 32 26 22 20   GLUCOSE 109*  --  86 92 81 67* 61*  BUN 27*  --  12 8 12 15 11   CREATININE 1.06 1.03 0.94 0.98 0.97 0.84 0.80  CALCIUM 8.9  --  8.0* 8.5 8.6 7.9* 8.4  MG  --  2.3  --   --   --   --   --    Liver Function Tests:  Recent Labs Lab 08/17/12 1926 08/18/12 0545 08/18/12 2055 08/21/12 8119  AST 47* 34 36 25  ALT 66* 53 54* 36  ALKPHOS 104 83 97 88  BILITOT 0.6 0.4 0.4 0.4  PROT 6.9 5.6* 5.9* 6.2  ALBUMIN 3.8 3.1* 3.3* 3.1*    Recent Labs Lab 08/17/12 1926  LIPASE 16   No results found for this basename: AMMONIA,  in the last 168 hours CBC:  Recent Labs Lab 08/17/12 1926 08/18/12 0545 08/18/12 2055  08/19/12 1953 08/20/12 0900 08/21/12 0610 08/22/12 0602 08/23/12 0445  WBC 5.7 4.9 6.3  --   --   --  6.9 7.0 6.1  NEUTROABS 3.5  --   --   --   --   --  5.1  --   --   HGB 17.3* 14.0 13.9  < > 14.2 13.1 14.3 13.5 13.1  HCT 48.5 40.5 40.3  < > 40.6 38.3* 40.7 39.5 38.5*  MCV 90.7 91.8 91.6  --   --   --  90.6 90.8 91.0  PLT 159 141* 131*  --   --   --  169 175 184  < >  = values in this interval not displayed. Cardiac Enzymes: No results found for this basename: CKTOTAL, CKMB, CKMBINDEX, TROPONINI,  in the last 168 hours BNP (last 3 results) No results found for this basename: PROBNP,  in the last 8760 hours CBG: No results found for this basename: GLUCAP,  in the last 168 hours  Recent Results (from the past 240 hour(s))  CLOSTRIDIUM DIFFICILE BY PCR     Status: None   Collection Time    08/18/12  6:26 AM      Result Value Range Status   C difficile by pcr NEGATIVE  NEGATIVE Final     Studies: Dg Abd 2 Views  08/23/2012  *RADIOLOGY REPORT*  Clinical Data: Abdominal pain and nausea.  Evaluate for small bowel obstruction.  ABDOMEN - 2 VIEW  Comparison: Abdominal radiograph 08/22/2012.  Findings: There are multiple borderline dilated and mildly dilated loops of small bowel, many of which demonstrate air fluid levels. Paucity of colonic gas is noted.  Surgical clips project over the right upper quadrant of the abdomen, likely from prior cholecystectomy.  No definite pneumoperitoneum.  Nasogastric tube extends into the second portion of the duodenum.  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  IMPRESSION: 1.  Findings are again concerning for a probable small bowel obstruction (given the presence of some gas within the colon, this is likely partial).   Original Report Authenticated By: Trudie Reed, M.D.     Scheduled Meds: . buPROPion  300 mg Oral BID  . FLUoxetine  40 mg Oral Daily  . methylphenidate  10 mg Oral TID  . pantoprazole (PROTONIX) IV  40 mg Intravenous Q12H  . sodium chloride  3 mL Intravenous Q12H   Continuous Infusions: . 0.9 % NaCl with KCl 20 mEq / L 75 mL/hr at 08/23/12 2118    Principal Problem:   Ileus Active Problems:   Agnes Lawrence, MD  [facility] Pager [phone]. If 7PM-7AM, please contact night-coverage at [email or web address], password [facility] 08/24/2012, 9:13  AM  LOS: 7 days

## 2012-08-24 NOTE — Progress Notes (Signed)
  Subjective: Had diarrhea BM last night Less abdominal pain today  Objective: Vital signs in last 24 hours: Temp:  [97.9 F (36.6 C)-98.6 F (37 C)] 97.9 F (36.6 C) (02/20 0500) Pulse Rate:  [64-71] 64 (02/20 0500) Resp:  [16-18] 18 (02/20 0500) BP: (109-112)/(55-88) 112/88 mmHg (02/20 0500) SpO2:  [95 %-99 %] 99 % (02/20 0500) Weight:  [166 lb 10.7 oz (75.6 kg)] 166 lb 10.7 oz (75.6 kg) (02/20 0500) Last BM Date: 2012-09-20  Intake/Output from previous day: 2022/09/20 0701 - 02/20 0700 In: 1800 [I.V.:1800] Out: 2300 [Urine:1500; Emesis/NG output:800] Intake/Output this shift:    Abdomen much softer today on exam with only mild tenderness  Lab Results:   Recent Labs  08/22/12 0602 2012/09/20 0445  WBC 7.0 6.1  HGB 13.5 13.1  HCT 39.5 38.5*  PLT 175 184   BMET  Recent Labs  2012/09/20 0445 08/24/12 0631  NA 140 138  K 4.3 4.4  CL 107 105  CO2 22 20  GLUCOSE 67* 61*  BUN 15 11  CREATININE 0.84 0.80  CALCIUM 7.9* 8.4   PT/INR No results found for this basename: LABPROT, INR,  in the last 72 hours ABG No results found for this basename: PHART, PCO2, PO2, HCO3,  in the last 72 hours  Studies/Results: Dg Abd 2 Views  09/20/2012  *RADIOLOGY REPORT*  Clinical Data: Abdominal pain and nausea.  Evaluate for small bowel obstruction.  ABDOMEN - 2 VIEW  Comparison: Abdominal radiograph 08/22/2012.  Findings: There are multiple borderline dilated and mildly dilated loops of small bowel, many of which demonstrate air fluid levels. Paucity of colonic gas is noted.  Surgical clips project over the right upper quadrant of the abdomen, likely from prior cholecystectomy.  No definite pneumoperitoneum.  Nasogastric tube extends into the second portion of the duodenum.  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  IMPRESSION: 1.  Findings are again concerning for a probable small bowel obstruction (given the presence of some gas within the colon, this is likely  partial).   Original Report Authenticated By: Trudie Reed, M.D.     Anti-infectives: Anti-infectives   Start     Dose/Rate Route Frequency Ordered Stop   08/19/12 1130  ciprofloxacin (CIPRO) IVPB 400 mg  Status:  Discontinued     400 mg 200 mL/hr over 60 Minutes Intravenous Every 12 hours 08/19/12 1100 08/21/12 1518   08/19/12 1130  metroNIDAZOLE (FLAGYL) IVPB 500 mg  Status:  Discontinued     500 mg 100 mL/hr over 60 Minutes Intravenous Every 8 hours 08/19/12 1100 08/21/12 1518   08/17/12 2200  cefTRIAXone (ROCEPHIN) 1 g in dextrose 5 % 50 mL IVPB     1 g 100 mL/hr over 30 Minutes Intravenous  Once 08/17/12 2156 08/17/12 2342      Assessment/Plan: s/p * No surgery found *  SBO vs ileus  Will order upper GI/SBFT for today to assess stomach and small bowel  LOS: 7 days    [name] 08/24/2012

## 2012-08-25 LAB — BASIC METABOLIC PANEL
BUN: 11 mg/dL (ref 6–23)
Calcium: 8.8 mg/dL (ref 8.4–10.5)
Creatinine, Ser: 0.78 mg/dL (ref 0.50–1.35)
GFR calc non Af Amer: >90 mL/min (ref >90)
Glucose, Bld: 119 mg/dL — ABNORMAL HIGH (ref 70–99)

## 2012-08-25 MED ORDER — PANTOPRAZOLE SODIUM 40 MG PO TBEC
80.0000 mg | DELAYED_RELEASE_TABLET | Freq: Every day | ORAL | Status: DC
  Administered 2012-08-25 – 2012-08-27 (×3): 80 mg via ORAL
  Filled 2012-08-25 (×3): qty 2

## 2012-08-25 NOTE — Progress Notes (Signed)
Patient ID: [name], male   DOB: 1950/08/14, 62 y.o.   MRN: [id number]  With patient clinically improving and no evidence of bowel obstruction with normal transit time on SBFT, will sign off.  Suspect viral illness.  Call if needed

## 2012-08-25 NOTE — Progress Notes (Signed)
I have seen and examined the patient and agree with the assessment and plans.  Deontra Pereyra A. Ziyan Schoon  MD, FACS  

## 2012-08-25 NOTE — Progress Notes (Signed)
Patient ID: [name], male   DOB: [birth year], 62 y.o.   MRN: [id number]   LOS: 8 days   Subjective: Feeling much better. Pain improved, now only describes discomfort. Denies N/V with clears though hasn't had much to drink, not especially thirsty. Multiple (~20) e/o diarrhea over last 24h.  Objective: Vital signs in last 24 hours: Temp:  [97.6 F (36.4 C)-98.6 F (37 C)] 98.6 F (37 C) (02/20 2237) Pulse Rate:  [70-72] 70 (02/20 2237) Resp:  [18] 18 (02/20 2237) BP: (110-125)/(68-87) 125/87 mmHg (02/20 2237) SpO2:  [100 %] 100 % (02/20 2237) Last BM Date: 08/24/12   Radiology SMALL BOWEL SERIES (08/24/12) Technique: Following ingestion of a mixture of thin barium and  Entero Vu, serial small bowel images were obtained including spot  views of the terminal ileum.  Fluoroscopy time: 0.57 minutes.  Comparison: CT scan 08/18/2012.  Findings: A small bowel series was performed through an existing  NG tube. The duodenal bulb and C-loop are normal. The duodenal  jejunal junction is in its normal anatomic location. The proximal  loops of small bowel are mildly dilated but the mucosal fold  pattern is normal. There is a transition to normal ileal loops of  small bowel which also demonstrate normal mucosal fold pattern.  Contrast gets all way to the colon at 1 hour and 35 minutes. The  terminal ileum appears normal. The small bowel loops were mobile.  No intrinsic or extrinsic mass lesions are seen.  IMPRESSION:  Mildly dilated small bowel loops but relatively normal small bowel  transit time. No findings for obstruction. The small bowel loops  demonstrate normal mucosal fold pattern.  Original Report Authenticated By: Rudie Meyer, M.D.   General appearance: alert and no distress Resp: clear to auscultation bilaterally Cardio: regular rate and rhythm GI: Soft, no distension, +BS, mild TTP.   Assessment/Plan: SBO -- Advance to fulls. Will decrease IVF slightly but want to  avoid dehydration with diarrhea. ? Loperamide to slow transit, defer to MD.    Freeman Caldron, PA-C Pager: [phone]   08/25/2012

## 2012-08-25 NOTE — Progress Notes (Addendum)
[facility] PROGRESS NOTE  [name] ZOX:[id number] DOB: 04-24-1951 DOA: 08/17/2012 PCP: Emeterio Reeve, MD Brief Narrative: 62 year old male with PMH of GERD, Barrett's esophagus, MVP, who states that 8 days prior to admission started to experience epigastric abdominal pain, nausea, and diarrhea without vomiting. He was hospitalized 02/14 for ? SBO and while in hospital has developed some blood in stool with diarrhea. GI has been consulted and since abdominal XRAY this AM 02/17 has shown worsening SBO, surgery was consulted for further recommendations.   Assessment/Plan: Small bowel obstruction  - NG placed on 08/22/2012 for decompression removed on 08/24/2012, abdominal distention improved. - Continue IVF, analgesia and antiemetics as needed. - Currently on full liquids, diet to be advanced as tolerated. - Small bowel series on 08/24/2012 showed no findings of obstruction. - Cipro and Flagyl have been discontinued as C. Diff negative   Intermittent episodes of diarrhea  - Likely secondary to gastroenteritis. - Continue supportive care  - Discussed with GI Dr. Evette Cristal, suspect patient likely has osmotic diarrhea from Gastrografin, recommended continuing observation for now.  HYPOKALEMIA  - Resolved with replacement.   HYPERLIPIDEMIA  - Stable medical issue   GERD  - Continue protonix   Consultants:  GI  Surgery  Procedures/Studies:  Dg Abd 1 View 08/21/2012 --> Increased gaseous distention of small bowel loops. Findings concern for worsening SBO.  Dg Abd 2 Views 08/22/2012 --> Findings suggestive of small bowel obstruction, less likely adynamic ileus   Antibiotics:  Flagyl 2/14 --> 2/17  Ciprofloxacin 2/14 --> 2/17  Code Status: Full  Family Communication: Pt and wife at bedside  Disposition Plan: Home when medically stable. Pending.  HPI/Subjective: Patient tolerating oral intake, however.  He has had numerous loose bowel movements.  Objective: Filed Vitals:    08/23/12 2100 08/24/12 0500 08/24/12 1400 08/24/12 2237  BP: 110/69 112/88 110/68 125/87  Pulse: 70 64 72 70  Temp: 98.6 F (37 C) 97.9 F (36.6 C) 97.6 F (36.4 C) 98.6 F (37 C)  TempSrc:      Resp: 18 18 18 18   Height:      Weight:  75.6 kg (166 lb 10.7 oz)    SpO2: 96% 99% 100% 100%    Intake/Output Summary (Last 24 hours) at 08/25/12 0957 Last data filed at 08/25/12 0531  Gross per 24 hour  Intake   3390 ml  Output    352 ml  Net   3038 ml   Filed Weights   08/18/12 0700 08/23/12 0500 08/24/12 0500  Weight: 77.111 kg (170 lb) 75.467 kg (166 lb 6 oz) 75.6 kg (166 lb 10.7 oz)    Exam: Physical Exam: General: Awake, Oriented, No acute distress. HEENT: EOMI, NG in place. Neck: Supple CV: S1 and S2 Lungs: Clear to ascultation bilaterally Abdomen: Soft, Nontender, Nondistended, +bowel sounds. Ext: Good pulses. Trace edema.  Data Reviewed: Basic Metabolic Panel:  Recent Labs Lab 08/21/12 0610 08/22/12 0602 08/23/12 0445 08/24/12 0631 08/25/12 0702  NA 141 144 140 138 138  K 3.0* 4.4 4.3 4.4 4.0  CL 102 107 107 105 106  CO2 32 26 22 20 22   GLUCOSE 92 81 67* 61* 119*  BUN 8 12 15 11 11   CREATININE 0.98 0.97 0.84 0.80 0.78  CALCIUM 8.5 8.6 7.9* 8.4 8.8   Liver Function Tests:  Recent Labs Lab 08/18/12 2055 08/21/12 0610  AST 36 25  ALT 54* 36  ALKPHOS 97 88  BILITOT 0.4 0.4  PROT 5.9* 6.2  ALBUMIN  3.3* 3.1*   No results found for this basename: LIPASE, AMYLASE,  in the last 168 hours No results found for this basename: AMMONIA,  in the last 168 hours CBC:  Recent Labs Lab 08/18/12 2055  08/19/12 1953 08/20/12 0900 08/21/12 0610 08/22/12 0602 08/23/12 0445  WBC 6.3  --   --   --  6.9 7.0 6.1  NEUTROABS  --   --   --   --  5.1  --   --   HGB 13.9  < > 14.2 13.1 14.3 13.5 13.1  HCT 40.3  < > 40.6 38.3* 40.7 39.5 38.5*  MCV 91.6  --   --   --  90.6 90.8 91.0  PLT 131*  --   --   --  169 175 184  < > = values in this interval not  displayed. Cardiac Enzymes: No results found for this basename: CKTOTAL, CKMB, CKMBINDEX, TROPONINI,  in the last 168 hours BNP (last 3 results) No results found for this basename: PROBNP,  in the last 8760 hours CBG: No results found for this basename: GLUCAP,  in the last 168 hours  Recent Results (from the past 240 hour(s))  CLOSTRIDIUM DIFFICILE BY PCR     Status: None   Collection Time    08/18/12  6:26 AM      Result Value Range Status   C difficile by pcr NEGATIVE  NEGATIVE Final     Studies: Dg Small Bowel  08/24/2012  *RADIOLOGY REPORT*  Clinical Data:  Evaluate small bowel obstruction.  SMALL BOWEL SERIES  Technique:  Following ingestion of a mixture of thin barium and Entero Vu, serial small bowel images were obtained including spot views of the terminal ileum.  Fluoroscopy time:  0.57 minutes.  Comparison:  CT scan 08/18/2012.  Findings:  A small bowel series was performed through an existing NG tube.  The duodenal bulb and C-loop are normal.  The duodenal jejunal junction is in its normal anatomic location.  The proximal loops of small bowel are mildly dilated but the mucosal fold pattern is normal.  There is a transition to normal ileal loops of small bowel which also demonstrate normal mucosal fold pattern. Contrast gets all way to the colon at 1 hour and 35 minutes.  The terminal ileum appears normal.  The small bowel loops were mobile. No intrinsic or extrinsic mass lesions are seen.  IMPRESSION: Mildly dilated small bowel loops but relatively normal small bowel transit time.  No findings for obstruction.  The small bowel loops demonstrate normal mucosal fold pattern.   Original Report Authenticated By: Rudie Meyer, M.D.     Scheduled Meds: . buPROPion  300 mg Oral BID  . FLUoxetine  40 mg Oral Daily  . methylphenidate  10 mg Oral TID  . pantoprazole (PROTONIX) IV  40 mg Intravenous Q12H  . sodium chloride  3 mL Intravenous Q12H   Continuous Infusions: . dextrose 5 %  and 0.9% NaCl 1,000 mL with potassium chloride 20 mEq infusion 50 mL/hr at 08/25/12 1610    Principal Problem:   Ileus Active Problems:   Agnes Lawrence, MD  [facility] Pager [phone]. If 7PM-7AM, please contact night-coverage at [email or web address], password [facility] 08/25/2012, 9:57 AM  LOS: 8 days

## 2012-08-26 LAB — BASIC METABOLIC PANEL
BUN: 9 mg/dL (ref 6–23)
Calcium: 8.1 mg/dL — ABNORMAL LOW (ref 8.4–10.5)
Creatinine, Ser: 0.84 mg/dL (ref 0.50–1.35)
GFR calc Af Amer: >90 mL/min (ref >90)
GFR calc non Af Amer: >90 mL/min (ref >90)

## 2012-08-26 NOTE — Progress Notes (Signed)
[facility] PROGRESS NOTE  PAARTH CROPPER VWU:[id number] DOB: 1951/01/20 DOA: 08/17/2012 PCP: Emeterio Reeve, MD Brief Narrative: 62 year old male with PMH of GERD, Barrett's esophagus, MVP, who states that 8 days prior to admission started to experience epigastric abdominal pain, nausea, and diarrhea without vomiting. He was hospitalized 02/14 for ? SBO and while in hospital has developed some blood in stool with diarrhea. GI has been consulted and since abdominal XRAY this AM 02/17 has shown worsening SBO, surgery was consulted for further recommendations.   Assessment/Plan: Small bowel obstruction versus ileus - NG placed on 08/22/2012 for decompression, removed on 08/24/2012, abdominal distention improved. - Continue IVF, analgesia and antiemetics as needed. - Diet advanced to low residue diet. - Small bowel series on 08/24/2012 showed no findings of obstruction. - Cipro and Flagyl have been discontinued as C. Diff negative   Intermittent episodes of diarrhea  - Initially likely secondary to gastroenteritis. - Discussed with GI Dr. Evette Cristal, suspect patient likely has osmotic diarrhea from Gastrografin. Diarrhea seems to be improving still numerous but not as frequent. - Continue supportive care.  HYPOKALEMIA  - Resolved with replacement.   HYPERLIPIDEMIA  - Stable medical issue   GERD  - Continue protonix   Consultants:  GI  Surgery  Procedures/Studies:  Dg Abd 1 View 08/21/2012 --> Increased gaseous distention of small bowel loops. Findings concern for worsening SBO.  Dg Abd 2 Views 08/22/2012 --> Findings suggestive of small bowel obstruction, less likely adynamic ileus   Antibiotics:  Flagyl 2/14 --> 2/17  Ciprofloxacin 2/14 --> 2/17  Code Status: Full  Family Communication: Pt and wife at bedside  Disposition Plan: Home when medically stable. Pending, as patient still has numerous stools.  HPI/Subjective: Denies any abdominal pain. Had nausea few times but has  not vomited. Still having diarrhea but frequency is improving.  Objective: Filed Vitals:   08/24/12 1400 08/24/12 2237 08/25/12 2000 08/26/12 0644  BP: 110/68 125/87 125/80 98/57  Pulse: 72 70 65 70  Temp: 97.6 F (36.4 C) 98.6 F (37 C) 97.9 F (36.6 C) 98.2 F (36.8 C)  TempSrc:      Resp: 18 18 18 16   Height:      Weight:      SpO2: 100% 100% 100% 96%    Intake/Output Summary (Last 24 hours) at 08/26/12 0958 Last data filed at 08/26/12 0644  Gross per 24 hour  Intake   1280 ml  Output    206 ml  Net   1074 ml   Filed Weights   08/18/12 0700 08/23/12 0500 08/24/12 0500  Weight: 77.111 kg (170 lb) 75.467 kg (166 lb 6 oz) 75.6 kg (166 lb 10.7 oz)    Exam: Physical Exam: General: Awake, Oriented, No acute distress. HEENT: EOMI, NG in place. Neck: Supple CV: S1 and S2 Lungs: Clear to ascultation bilaterally Abdomen: Soft, Nontender, Nondistended, +bowel sounds. Ext: Good pulses. Trace edema.  Data Reviewed: Basic Metabolic Panel:  Recent Labs Lab 08/22/12 0602 08/23/12 0445 08/24/12 0631 08/25/12 0702 08/26/12 0607  NA 144 140 138 138 142  K 4.4 4.3 4.4 4.0 3.6  CL 107 107 105 106 109  CO2 26 22 20 22 25   GLUCOSE 81 67* 61* 119* 125*  BUN 12 15 11 11 9   CREATININE 0.97 0.84 0.80 0.78 0.84  CALCIUM 8.6 7.9* 8.4 8.8 8.1*   Liver Function Tests:  Recent Labs Lab 08/21/12 0610  AST 25  ALT 36  ALKPHOS 88  BILITOT 0.4  PROT  6.2  ALBUMIN 3.1*   No results found for this basename: LIPASE, AMYLASE,  in the last 168 hours No results found for this basename: AMMONIA,  in the last 168 hours CBC:  Recent Labs Lab 08/19/12 1953 08/20/12 0900 08/21/12 0610 08/22/12 0602 08/23/12 0445  WBC  --   --  6.9 7.0 6.1  NEUTROABS  --   --  5.1  --   --   HGB 14.2 13.1 14.3 13.5 13.1  HCT 40.6 38.3* 40.7 39.5 38.5*  MCV  --   --  90.6 90.8 91.0  PLT  --   --  169 175 184   Cardiac Enzymes: No results found for this basename: CKTOTAL, CKMB, CKMBINDEX,  TROPONINI,  in the last 168 hours BNP (last 3 results) No results found for this basename: PROBNP,  in the last 8760 hours CBG: No results found for this basename: GLUCAP,  in the last 168 hours  Recent Results (from the past 240 hour(s))  CLOSTRIDIUM DIFFICILE BY PCR     Status: None   Collection Time    08/18/12  6:26 AM      Result Value Range Status   C difficile by pcr NEGATIVE  NEGATIVE Final     Studies: Dg Small Bowel  08/24/2012  *RADIOLOGY REPORT*  Clinical Data:  Evaluate small bowel obstruction.  SMALL BOWEL SERIES  Technique:  Following ingestion of a mixture of thin barium and Entero Vu, serial small bowel images were obtained including spot views of the terminal ileum.  Fluoroscopy time:  0.57 minutes.  Comparison:  CT scan 08/18/2012.  Findings:  A small bowel series was performed through an existing NG tube.  The duodenal bulb and C-loop are normal.  The duodenal jejunal junction is in its normal anatomic location.  The proximal loops of small bowel are mildly dilated but the mucosal fold pattern is normal.  There is a transition to normal ileal loops of small bowel which also demonstrate normal mucosal fold pattern. Contrast gets all way to the colon at 1 hour and 35 minutes.  The terminal ileum appears normal.  The small bowel loops were mobile. No intrinsic or extrinsic mass lesions are seen.  IMPRESSION: Mildly dilated small bowel loops but relatively normal small bowel transit time.  No findings for obstruction.  The small bowel loops demonstrate normal mucosal fold pattern.   Original Report Authenticated By: Rudie Meyer, M.D.     Scheduled Meds: . buPROPion  300 mg Oral BID  . FLUoxetine  40 mg Oral Daily  . methylphenidate  10 mg Oral TID  . pantoprazole  80 mg Oral Daily  . sodium chloride  3 mL Intravenous Q12H   Continuous Infusions: . dextrose 5 % and 0.9% NaCl 1,000 mL with potassium chloride 20 mEq infusion 50 mL/hr at 08/25/12 1610    Principal  Problem:   Ileus Active Problems:   Agnes Lawrence, MD  [facility] Pager [phone]. If 7PM-7AM, please contact night-coverage at [email or web address], password [facility] 08/26/2012, 9:58 AM  LOS: 9 days

## 2012-08-26 NOTE — Progress Notes (Addendum)
Patient ID: [name], male   DOB: Sep 02, 1950, 62 y.o.   MRN: [id number] [facility] Progress Note  [name] 62 y.o. [birth year]   Subjective: Less diarrhea. Small form to stool just now. Abdominal pain less. Tolerating solid foods started today. Denies N/V. Wife at bedside.  Objective: Vital signs in last 24 hours: Filed Vitals:   08/26/12 0644  BP: 98/57  Pulse: 70  Temp: 98.2 F (36.8 C)  Resp: 16    Physical Exam: Gen: alert, no acute distress Abd: diffuse tenderness with voluntary guarding, soft, nondistended, +BS  Lab Results:  Recent Labs  08/25/12 0702 08/26/12 0607  NA 138 142  K 4.0 3.6  CL 106 109  CO2 22 25  GLUCOSE 119* 125*  BUN 11 9  CREATININE 0.78 0.84  CALCIUM 8.8 8.1*   No results found for this basename: AST, ALT, ALKPHOS, BILITOT, PROT, ALBUMIN,  in the last 72 hours No results found for this basename: WBC, NEUTROABS, HGB, HCT, MCV, PLT,  in the last 72 hours No results found for this basename: LABPROT, INR,  in the last 72 hours    Assessment/Plan: 62 yo s/p SBO that has resolved and resolved diarrhea. No obstruction or inflammation on SBFT. Ok to go home tomorrow if tolerates solid food today. Have patient f/u with Dr. Evette Cristal in 3-4 weeks. Will sign off. Call if questions.   [name] C. 08/26/2012, 12:13 PM

## 2012-08-27 MED ORDER — ONDANSETRON HCL 4 MG PO TABS: 4.0000 mg | ORAL_TABLET | Freq: Four times a day (QID) | ORAL | Status: DC | PRN

## 2012-08-27 NOTE — Progress Notes (Signed)
[facility] PROGRESS NOTE  BLANCA THORNTON QMV:[id number] DOB: [birth year] DOA: 08/17/2012 PCP: Emeterio Reeve, MD Brief Narrative: 62 year old male with PMH of GERD, Barrett's esophagus, MVP, who states that 8 days prior to admission started to experience epigastric abdominal pain, nausea, and diarrhea without vomiting. He was hospitalized 02/14 for ? SBO and while in hospital has developed some blood in stool with diarrhea. GI has been consulted and since abdominal XRAY this AM 02/17 has shown worsening SBO, surgery was consulted for further recommendations.   Assessment/Plan: Small bowel obstruction versus ileus - NG placed on 08/22/2012 for decompression, removed on 08/24/2012, abdominal distention improved. - Continue IVF, analgesia and antiemetics as needed. - Diet advanced to low residue diet, tolerating oral intake. - Small bowel series on 08/24/2012 showed no findings of obstruction. - Cipro and Flagyl have been discontinued as C. Diff negative   Intermittent episodes of diarrhea  - Initially likely secondary to gastroenteritis. - Discussed with GI Dr. Evette Cristal, suspect patient likely has osmotic diarrhea from Gastrografin. Diarrhea continues to improve.  - Continue supportive care.  HYPOKALEMIA  - Resolved with replacement.   HYPERLIPIDEMIA  - Stable medical issue   GERD  - Continue protonix   Consultants:  GI  Surgery  Procedures/Studies:  As above.  Antibiotics:  Flagyl 2/14 --> 2/17  Ciprofloxacin 2/14 --> 2/17  Code Status: Full  Family Communication: Pt and wife at bedside  Disposition Plan: DC home today.  HPI/Subjective: Denies any abdominal pain, tolerating oral intake. Diarrhea/loose stools improving.  Objective: Filed Vitals:   08/26/12 1400 08/26/12 2152 08/27/12 0500 08/27/12 0633  BP: 114/69 110/59  117/71  Pulse: 78 74  67  Temp: 97.6 F (36.4 C) 98 F (36.7 C)  98.1 F (36.7 C)  TempSrc:      Resp: 18 18  18   Height:      Weight:    70.489 kg (155 lb 6.4 oz)   SpO2: 100% 100%  99%    Intake/Output Summary (Last 24 hours) at 08/27/12 1000 Last data filed at 08/27/12 0750  Gross per 24 hour  Intake   1080 ml  Output   1000 ml  Net     80 ml   Filed Weights   08/23/12 0500 08/24/12 0500 08/27/12 0500  Weight: 75.467 kg (166 lb 6 oz) 75.6 kg (166 lb 10.7 oz) 70.489 kg (155 lb 6.4 oz)    Exam: Physical Exam: General: Awake, Oriented, No acute distress. HEENT: EOMI, NG in place. Neck: Supple CV: S1 and S2 Lungs: Clear to ascultation bilaterally Abdomen: Soft, Nontender, Nondistended, +bowel sounds. Ext: Good pulses. Trace edema.  Data Reviewed: Basic Metabolic Panel:  Recent Labs Lab 08/22/12 0602 08/23/12 0445 08/24/12 0631 08/25/12 0702 08/26/12 0607  NA 144 140 138 138 142  K 4.4 4.3 4.4 4.0 3.6  CL 107 107 105 106 109  CO2 26 22 20 22 25   GLUCOSE 81 67* 61* 119* 125*  BUN 12 15 11 11 9   CREATININE 0.97 0.84 0.80 0.78 0.84  CALCIUM 8.6 7.9* 8.4 8.8 8.1*   Liver Function Tests:  Recent Labs Lab 08/21/12 0610  AST 25  ALT 36  ALKPHOS 88  BILITOT 0.4  PROT 6.2  ALBUMIN 3.1*   No results found for this basename: LIPASE, AMYLASE,  in the last 168 hours No results found for this basename: AMMONIA,  in the last 168 hours CBC:  Recent Labs Lab 08/21/12 0610 08/22/12 0602 08/23/12 0445  WBC 6.9 7.0 6.1  NEUTROABS 5.1  --   --   HGB 14.3 13.5 13.1  HCT 40.7 39.5 38.5*  MCV 90.6 90.8 91.0  PLT 169 175 184   Cardiac Enzymes: No results found for this basename: CKTOTAL, CKMB, CKMBINDEX, TROPONINI,  in the last 168 hours BNP (last 3 results) No results found for this basename: PROBNP,  in the last 8760 hours CBG: No results found for this basename: GLUCAP,  in the last 168 hours  Recent Results (from the past 240 hour(s))  CLOSTRIDIUM DIFFICILE BY PCR     Status: None   Collection Time    08/18/12  6:26 AM      Result Value Range Status   C difficile by pcr NEGATIVE  NEGATIVE  Final     Studies: No results found.  Scheduled Meds: . buPROPion  300 mg Oral BID  . FLUoxetine  40 mg Oral Daily  . methylphenidate  10 mg Oral TID  . pantoprazole  80 mg Oral Daily  . sodium chloride  3 mL Intravenous Q12H   Continuous Infusions: . dextrose 5 % and 0.9% NaCl 1,000 mL with potassium chloride 20 mEq infusion 50 mL/hr at 08/27/12 0106    Principal Problem:   Ileus Active Problems:   Agnes Lawrence, MD  [facility] Pager [phone]. If 7PM-7AM, please contact night-coverage at [email or web address], password [facility] 08/27/2012, 10:00 AM  LOS: 10 days

## 2012-08-27 NOTE — Discharge Summary (Signed)
Physician Discharge Summary  [name] ZOX:[id number] DOB: [birth year] DOA: 08/17/2012  PCP: Emeterio Reeve, MD  Admit date: 08/17/2012 Discharge date: 08/27/2012  Time spent: 35 minutes  Recommendations for Outpatient Follow-up:  Please followup with Emeterio Reeve, MD (PCP) in 1 week.  Please followup with Dr. Evette Cristal (GI) in 3-4 weeks.  Discharge Diagnoses:  Principal Problem:   Ileus Active Problems:   HYPERLIPIDEMIA   G E R D   Discharge Condition: Stable  Diet recommendation: Heart healthy diet  Filed Weights   08/23/12 0500 08/24/12 0500 08/27/12 0500  Weight: 75.467 kg (166 lb 6 oz) 75.6 kg (166 lb 10.7 oz) 70.489 kg (155 lb 6.4 oz)    History of present illness:  62 year old male with PMH of GERD, Barrett's esophagus, MVP, who states that 8 days prior to admission started to experience epigastric abdominal pain, nausea, and diarrhea without vomiting. He was hospitalized 02/14 for ? SBO and while in hospital has developed some blood in stool with diarrhea. GI has been consulted and since abdominal XRAY this AM 02/17 has shown worsening SBO, surgery was consulted for further recommendations.   Hospital Course:  Small bowel obstruction versus ileus NG placed on 08/22/2012 for decompression, removed on 08/24/2012, abdominal distention improved. Placed on supportive care with IVF, analgesia and antiemetics as needed. Diet advanced to low residue diet, tolerating oral intake prior to discharge. Small bowel series on 08/24/2012 showed no findings of obstruction. Cipro and Flagyl have been discontinued as C. Diff negative. GI and General surgery have been following the patient during the hospital stay.  Intermittent episodes of diarrhea  Initially likely secondary to gastroenteritis. Diarrhea improved/resolved during the hospital stay.   Hypokalemia Resolved with replacement.   Hyperlipidemia Stable, resume statin at discharge.   GERD  Continue PPI   Consultants:   GI  Surgery  Procedures/Studies:  As above.  Antibiotics:  Flagyl 2/14 --> 2/17  Ciprofloxacin 2/14 --> 2/17  Discharge Exam: Filed Vitals:   08/26/12 1400 08/26/12 2152 08/27/12 0500 08/27/12 0633  BP: 114/69 110/59  117/71  Pulse: 78 74  67  Temp: 97.6 F (36.4 C) 98 F (36.7 C)  98.1 F (36.7 C)  TempSrc:      Resp: 18 18  18   Height:      Weight:   70.489 kg (155 lb 6.4 oz)   SpO2: 100% 100%  99%   Discharge Instructions  Discharge Orders   Future Orders Complete By Expires     Diet - low sodium heart healthy  As directed     Discharge instructions  As directed     Comments:      Please followup with Emeterio Reeve, MD (PCP) in 1 week.  Please followup with Dr. Evette Cristal (GI) in 3-4 weeks.    Increase activity slowly  As directed         Medication List    TAKE these medications       buPROPion 150 MG 12 hr tablet  Commonly known as:  WELLBUTRIN SR  Take 300 mg by mouth 2 (two) times daily.     CoQ-10 200 MG Caps  Take 1 capsule by mouth daily.     FLUoxetine 40 MG capsule  Commonly known as:  PROZAC  Take 40 mg by mouth daily.     methylphenidate 10 MG tablet  Commonly known as:  RITALIN  Take 10 mg by mouth 3 (three) times daily.     multivitamin capsule  Take 1 capsule  by mouth daily.     omeprazole 20 MG capsule  Commonly known as:  PRILOSEC  Take 40 mg by mouth daily.     ondansetron 4 MG tablet  Commonly known as:  ZOFRAN  Take 1 tablet (4 mg total) by mouth every 6 (six) hours as needed for nausea.     simvastatin 20 MG tablet  Commonly known as:  ZOCOR  Take 20 mg by mouth at bedtime.           Follow-up Information   Follow up with Emeterio Reeve, MD. Schedule an appointment as soon as possible for a visit in 1 week.   Contact information:   [address] [place] [address] [phone]       Follow up with Graylin Shiver, MD. Schedule an appointment as soon as possible for a visit in 3 weeks.   Contact  information:   [address], [address] [place] [address] [phone]        The results of significant diagnostics from this hospitalization (including imaging, microbiology, ancillary and laboratory) are listed below for reference.    Significant Diagnostic Studies: Dg Abd 1 View  08/21/2012  *RADIOLOGY REPORT*  Clinical Data: Partial small bowel obstruction.  ABDOMEN - 1 VIEW  Comparison: 08/19/2012  Findings: Supine view of the abdomen was obtained.  Again noted are dilated loops of small bowel throughout the upper abdomen.  There is increased small bowel gaseous distention, particularly in the right upper abdomen.  A small bowel loop now measures up to 5.7 cm. Again noted is a left hip arthroplasty.  Mild degenerative changes in the right hip. Minimal gas in the colon.  IMPRESSION: Increased gaseous distention of small bowel loops. Findings raise concern for worsening small bowel obstruction.   Original Report Authenticated By: Richarda Overlie, M.D.    Dg Abd 1 View  08/19/2012  *RADIOLOGY REPORT*  Clinical Data: Evaluate small bowel obstruction  ABDOMEN - 1 VIEW  Comparison: Yesterday pain  Findings: Mildly distended small bowel loops, scattered across the abdomen, are improved.  Contrast has passed out of the colon. Colon is decompressed.  Post cholecystectomy.  No obvious free intraperitoneal gas.  Left total hip arthroplasty anatomically aligned.  IMPRESSION: Improving partial small bowel obstruction pattern.   Original Report Authenticated By: Jolaine Click, M.D.    Dg Abd 1 View  08/18/2012  *RADIOLOGY REPORT*  Clinical Data: Diffuse abdominal pain, nausea and diarrhea, history of exploratory abdominal surgery many years ago  ABDOMEN - 1 VIEW  Comparison: CT abdomen pelvis of 08/18/2012  Findings: An erect view of the abdomen was obtained.  There are dilated loops of small bowel with differential air-fluid levels, very suspicious for partial small bowel obstruction.  Some air is noted  within the nondistended colon with contrast present primarily in the descending and rectosigmoid colon.  No free air is seen. Surgical clips are present in the right upper quadrant from prior cholecystectomy.  IMPRESSION: Suspect partial small bowel obstruction.  No free air.   Original Report Authenticated By: Dwyane Dee, M.D.    Ct Abdomen Pelvis W Contrast  08/18/2012  *RADIOLOGY REPORT*  Clinical Data: Abdominal pain.  CT ABDOMEN AND PELVIS WITH CONTRAST  Technique:  Multidetector CT imaging of the abdomen and pelvis was performed following the standard protocol during bolus administration of intravenous contrast.  Contrast: OMNIPAQUE IOHEXOL 300 MG/ML  SOLN  Comparison: 08/17/2012 radiographs  Findings: Stable 6 x 4 mm left lower lobe pulmonary  nodule, image 2 of series 3, no change in 2010, considered benign.  There is a small amount of orally administered contrast in the distal esophagus, suspicious for gastroesophageal reflux.  Small cyst noted in the lateral segment left hepatic lobe and in the right hepatic lobe, stable.  The pancreas, spleen, and adrenal glands appear unremarkable.  Mild enlargement the exophytic Bosniak category one cyst from the right kidney upper pole noted, currently measuring 3.0 x 2.2 cm.  Enlargement of the left kidney lower pole cyst, currently measuring 4.5 x 3.7 cm, noted.  This has simple characteristics.  Mild stranding adjacent to the lower pole of the left kidney, similar to prior.  No pathologic retroperitoneal or porta hepatis adenopathy is identified.  No pathologic pelvic adenopathy is identified.  Left hip implant noted.  Mildly dilated jejunum noted with air-fluid levels.  Progressive dilution of contrast column noted.  The dilated bowel measures up to about 4 cm in diameter; no significant or obvious transition point to nondilated small bowel observed.  Scattered small mesenteric lymph nodes are not pathologically enlarged by size criteria.  There are fluid  levels in the descending colon favoring diarrheal process.  Appendix surgically absent.  Gallbladder surgically absent.  IMPRESSION:  1.  Air-fluid levels in the descending colon favoring diarrheal process. 2.  Mildly dilated loops of proximal small bowel, with progressive contrast dilution but with only a gradual transition to nondilated bowel.  Given the lack of a lead point, this may simply be due to ileus. 3.  Contrast in the distal esophagus, suspicious for gastroesophageal reflux.   Original Report Authenticated By: Gaylyn Rong, M.D.    Dg Small Bowel  08/24/2012  *RADIOLOGY REPORT*  Clinical Data:  Evaluate small bowel obstruction.  SMALL BOWEL SERIES  Technique:  Following ingestion of a mixture of thin barium and Entero Vu, serial small bowel images were obtained including spot views of the terminal ileum.  Fluoroscopy time:  0.57 minutes.  Comparison:  CT scan 08/18/2012.  Findings:  A small bowel series was performed through an existing NG tube.  The duodenal bulb and C-loop are normal.  The duodenal jejunal junction is in its normal anatomic location.  The proximal loops of small bowel are mildly dilated but the mucosal fold pattern is normal.  There is a transition to normal ileal loops of small bowel which also demonstrate normal mucosal fold pattern. Contrast gets all way to the colon at 1 hour and 35 minutes.  The terminal ileum appears normal.  The small bowel loops were mobile. No intrinsic or extrinsic mass lesions are seen.  IMPRESSION: Mildly dilated small bowel loops but relatively normal small bowel transit time.  No findings for obstruction.  The small bowel loops demonstrate normal mucosal fold pattern.   Original Report Authenticated By: Rudie Meyer, M.D.    Dg Abd 2 Views  08/23/2012  *RADIOLOGY REPORT*  Clinical Data: Abdominal pain and nausea.  Evaluate for small bowel obstruction.  ABDOMEN - 2 VIEW  Comparison: Abdominal radiograph 08/22/2012.  Findings: There are multiple  borderline dilated and mildly dilated loops of small bowel, many of which demonstrate air fluid levels. Paucity of colonic gas is noted.  Surgical clips project over the right upper quadrant of the abdomen, likely from prior cholecystectomy.  No definite pneumoperitoneum.  Nasogastric tube extends into the second portion of the duodenum.  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  IMPRESSION: 1.  Findings are again concerning for a probable small  bowel obstruction (given the presence of some gas within the colon, this is likely partial).   Original Report Authenticated By: Trudie Reed, M.D.    Dg Abd 2 Views  08/22/2012  *RADIOLOGY REPORT*  Clinical Data: Follow up small bowel obstruction/ileus  ABDOMEN - 2 VIEW  Comparison: 08/22/2015  Findings: Multiple dilated loops of small bowel in the central abdomen.  A loop of transverse colon is visualized on the upright radiograph, while the remainder of the colon is largely decompressed.  Multiple air fluid levels on the upright radiograph.  This overall appearance favors a small bowel obstruction, less likely adynamic ileus.  No evidence of free air under the diaphragm on the upright view.  Cholecystectomy clips.  Suspected small right pleural effusion.  Left hip arthroplasty.  IMPRESSION: Findings suggestive of small bowel obstruction, less likely adynamic ileus   Original Report Authenticated By: Charline Bills, M.D.    Dg Abd Acute W/chest  08/17/2012  *RADIOLOGY REPORT*  Clinical Data: Shortness of breath, vomiting, fever, abdominal pain and bloating.  ACUTE ABDOMEN SERIES (ABDOMEN 2 VIEW & CHEST 1 VIEW)  Comparison: Chest radiograph performed 06/08/2011, and pelvis radiograph performed 06/14/2011  Findings: The lungs are relatively well-aerated; there is mild elevation of the left hemidiaphragm.  Minimal bilateral airspace opacities could reflect mild pneumonia.  No pleural effusion or pneumothorax is seen.  The  cardiomediastinal silhouette is within normal limits.  The visualized bowel gas pattern is nonspecific.  A few distended loops of small bowel are seen, measuring up to 4.5 cm in diameter, with an associated air-fluid level. However, these loops demonstrate gradual decompression, without evidence of obstruction. Residual air and fluid are noted within the colon.  No free intra- abdominal air is identified on the provided upright view.  Clips are noted within the right upper quadrant, reflecting prior cholecystectomy.  No acute osseous abnormalities are seen; the sacroiliac joints are unremarkable in appearance. 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  There is partial sacralization of vertebral body L5 on the left side.  IMPRESSION:  1.  Minimal bilateral airspace opacities could reflect mild pneumonia. 2.  Nonspecific bowel gas pattern; few distended loops of small bowel measure up to 4.5 cm, with an associated air-fluid level. However, there is gradual decompression of these loops, without definite evidence of obstruction.  Residual air and fluid are also seen within the colon.  This could reflect some degree of dysmotility, or less likely a partial small bowel obstruction; would correlate with symptoms.   Original Report Authenticated By: Tonia Ghent, M.D.     Microbiology: Recent Results (from the past 240 hour(s))  CLOSTRIDIUM DIFFICILE BY PCR     Status: None   Collection Time    08/18/12  6:26 AM      Result Value Range Status   C difficile by pcr NEGATIVE  NEGATIVE Final     Labs: Basic Metabolic Panel:  Recent Labs Lab 08/22/12 0602 08/23/12 0445 08/24/12 0631 08/25/12 0702 08/26/12 0607  NA 144 140 138 138 142  K 4.4 4.3 4.4 4.0 3.6  CL 107 107 105 106 109  CO2 26 22 20 22 25   GLUCOSE 81 67* 61* 119* 125*  BUN 12 15 11 11 9   CREATININE 0.97 0.84 0.80 0.78 0.84  CALCIUM 8.6 7.9* 8.4 8.8 8.1*   Liver Function  Tests:  Recent Labs Lab 08/21/12 0610  AST 25  ALT 36  ALKPHOS 88  BILITOT 0.4  PROT 6.2  ALBUMIN 3.1*   No results found for this basename: LIPASE, AMYLASE,  in the last 168 hours No results found for this basename: AMMONIA,  in the last 168 hours CBC:  Recent Labs Lab 08/21/12 0610 08/22/12 0602 08/23/12 0445  WBC 6.9 7.0 6.1  NEUTROABS 5.1  --   --   HGB 14.3 13.5 13.1  HCT 40.7 39.5 38.5*  MCV 90.6 90.8 91.0  PLT 169 175 184   Cardiac Enzymes: No results found for this basename: CKTOTAL, CKMB, CKMBINDEX, TROPONINI,  in the last 168 hours BNP: BNP (last 3 results) No results found for this basename: PROBNP,  in the last 8760 hours CBG: No results found for this basename: GLUCAP,  in the last 168 hours     Signed:  Ankit Degregorio A  [facility] 08/27/2012, 10:04 AM

## 2012-12-10 IMAGING — NM NM OCTREOTIDE LOCALIZATION
1 series · 24 of 24 positions shown · non-contrast
Comparison: None.

CLINICAL DATA: Hyperparathyroidism.

NUCLEAR MEDICINE PARATHYROID SCAN WITH SPECT IMAGING
TECHNIQUE: After intravenous administration of radiopharmaceutical
images of the neck and mediastinum were obtained at approximately
10 minutes 90 minutes and 180 minutes. SPECT imaging performed.
Radiopharmaceutical: 25 mCi technetium 99m sestamibi IV.

[Series 0: parathyroid · 4.7mm · 4.66mm/px · 4 acquisitions, 24 frames shown]
[im 1/4]
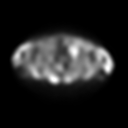
[im 1/4]
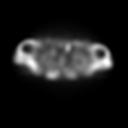
[im 1/4]
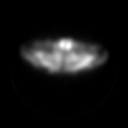
[im 1/4]
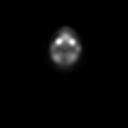
[im 1/4]
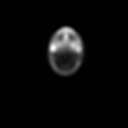
[im 1/4]
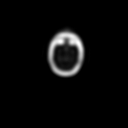
[im 2/4]
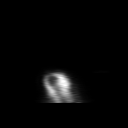
[im 2/4]
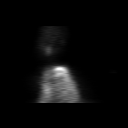
[im 2/4]
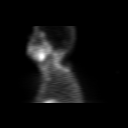
[im 2/4]
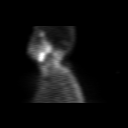
[im 2/4]
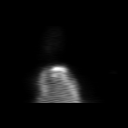
[im 2/4]
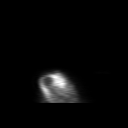
[im 3/4]
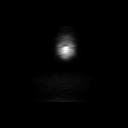
[im 3/4]
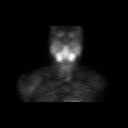
[im 3/4]
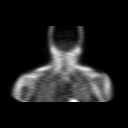
[im 3/4]
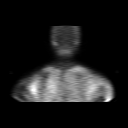
[im 3/4]
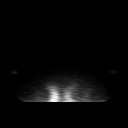
[im 3/4]
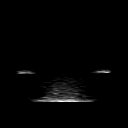
[im 4/4]
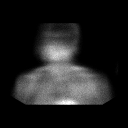
[im 4/4]
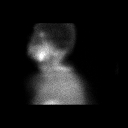
[im 4/4]
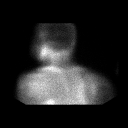
[im 4/4]
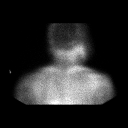
[im 4/4]
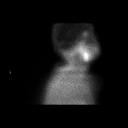
[im 4/4]
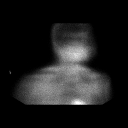

[24 of 24 positions shown; findings below may reference images not displayed]

FINDINGS: On the delayed to hour images, there is an area of
persistent activity noted along the inferior aspect of the left
thyroid lobe concerning for parathyroid adenoma.
IMPRESSION: Persistent accumulation of radiotracer on the inferior aspect of
the left thyroid lobe concerning for parathyroid adenoma.

## 2012-12-25 ENCOUNTER — Other Ambulatory Visit: Payer: Self-pay | Admitting: Family Medicine

## 2012-12-25 ENCOUNTER — Ambulatory Visit
Admission: RE | Admit: 2012-12-25 | Discharge: 2012-12-25 | Disposition: A | Discharge status: Home / Self Care | Payer: BC Managed Care – PPO | Source: Ambulatory Visit | Attending: Family Medicine | Admitting: Family Medicine

## 2012-12-25 DIAGNOSIS — M5412 Radiculopathy, cervical region: Secondary | ICD-10-CM

## 2013-02-15 ENCOUNTER — Ambulatory Visit (INDEPENDENT_AMBULATORY_CARE_PROVIDER_SITE_OTHER): Payer: BC Managed Care – PPO | Admitting: Neurology

## 2013-02-15 ENCOUNTER — Encounter: Payer: Self-pay | Admitting: Neurology

## 2013-02-15 VITALS — BP 99/57 | HR 77 | Ht 71.0 in | Wt 170.0 lb

## 2013-02-15 DIAGNOSIS — R269 Unspecified abnormalities of gait and mobility: Secondary | ICD-10-CM

## 2013-02-15 DIAGNOSIS — G609 Hereditary and idiopathic neuropathy, unspecified: Secondary | ICD-10-CM

## 2013-02-15 DIAGNOSIS — R42 Dizziness and giddiness: Secondary | ICD-10-CM

## 2013-02-15 NOTE — Patient Instructions (Signed)
Overall you are doing fairly well but I do want to suggest a few things today:   Remember to drink plenty of fluid, eat healthy meals and do not skip any meals. Try to eat protein with a every meal and eat a healthy snack such as fruit or nuts in between meals. Try to keep a regular sleep-wake schedule and try to exercise daily, particularly in the form of walking, 20-30 minutes a day, if you can.   As far as diagnostic testing: I would like to check some labs for a cause of your symptoms. We will also check a MRI of your brain to check for MS as you state this was a questionable diagnosis in the past  I would like to see you back in 4 months, sooner if we need to. Please call us with any interim questions, concerns, problems, updates or refill requests.   Please also call us for any test results so we can go over those with you on the phone.  My clinical assistant and will answer any of your questions and relay your messages to me and also relay most of my messages to you.   Our phone number is [phone]. We also have an after hours call service for urgent matters and there is a physician on-call for urgent questions. For any emergencies you know to call 911 or go to the nearest emergency room

## 2013-02-15 NOTE — Progress Notes (Signed)
[facility]  Provider:  Dr Hosie Poisson Referring Provider: Emeterio Reeve, MD Primary Care Physician:  Emeterio Reeve, MD  CC:  Hand pain and gait imbalance  HPI:  [name] is a 62 y.o. male here as a referral from Dr. Paulino Rily for evaluation of bilateral hand pain and progressive gait instability.  He notes a sensation of dizziness, described as lightheaded feeling, no vertigo noted. He notes feeling off balance. This has been ongoing for greater than 6 months. Isn't unsteadiness E., and had few falls in the past. He is unclear why he is falling. Denies any weakness. He does note intermittent episodes of sharp shooting pain, this can involve any in all extremities, and no dermatomal distribution. Follow the small portion of his hand or finger. His head around C-spine, and evaluation for radiculopathy, both of these for the patient were normal. Denies any bowel bladder changes. No history of neck trauma. He does note history of his neck popping and has been told he has degenerative changes of the cervical spine. He recently had an MRI done, per the R. thumb no this showed a C5-6 disc bulge but no cord involvement.  He has a very complex past medical history, presents with illicit 29 her pain symptoms he is currently having. Per review of his list history of hypercalcemia, which was due to hyperparathyroidism. He had his parathyroid gland removed in June 2012. He notes MS being raised as a possible diagnosis. He notes having an MRI of the brain done at [facility] around 20 years ago. His wife reports that he was told that was not manifested imaging. No LP was done.   Review of Systems: Out of a complete 14 system review, the patient complains of only the following symptoms, and all other reviewed systems are negative.  positive for hearing loss ringing in ears urinary problems impotence depression anxiety none of sleep decreased energy tremor dizziness sleepiness     History   Social History  . Marital Status: Married    Spouse Name: N/A    Number of Children: N/A  . Years of Education: N/A   Occupational History  . Not on file.   Social History Main Topics  . Smoking status: Former Smoker -- 1.50 packs/day for 24 years    Types: Cigarettes  . Smokeless tobacco: Not on file     Comment: quit smoking in 1987  . Alcohol Use: No     Comment: none  . Drug Use: Yes    Special: Marijuana     Comment: college age  . Sexual Activity: Not on file   Other Topics Concern  . Not on file   Social History Narrative  . No narrative on file    Family History  Problem Relation Age of Onset  . Cancer Father     lung  . Cancer Brother     prostate    Past Medical History  Diagnosis Date  . Pneumonia 2009  . 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 Mitral valve prolapse     does not see cardiologist for. last stress test 2003  . Hypercalcemia   . Arthritis   . GERD (gastroesophageal reflux disease)   . Hiatal hernia   . Lipoma     left forearm (3)  . Dysrhythmia     ":Due to MVP"  . Bronchitis     history of  . 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 Cholecystectomy  2002  . Mastoidectomy  1996  . Appendectomy  1984  . Cataract extraction      L and R eye  . Parathyroidectomy    . Nasal septum surgery      sinus surgery  . Total hip revision  06/14/2011    Procedure: TOTAL HIP REVISION;  Surgeon: Nestor Lewandowsky;  Location: [facility] OR;  Service: Orthopedics;  Laterality: Left;  Left Acetabular  Hip Revision  . Joint replacement      Lt hip    Current Outpatient Prescriptions  Medication Sig Dispense Refill  . buPROPion (WELLBUTRIN SR) 150 MG 12 hr tablet Take 300 mg by mouth 2 (two) times daily.      . Coenzyme Q10 (COQ-10) 200 MG CAPS Take 1 capsule by mouth daily.      Marland Kitchen FLUoxetine (PROZAC) 40 MG capsule Take 40 mg by mouth daily.        . methylphenidate  (RITALIN) 10 MG tablet Take 10 mg by mouth 3 (three) times daily.        . Multiple Vitamin (MULTIVITAMIN) capsule Take 1 capsule by mouth daily.        Marland Kitchen omeprazole (PRILOSEC) 20 MG capsule Take 40 mg by mouth daily.       . ondansetron (ZOFRAN) 4 MG tablet Take 1 tablet (4 mg total) by mouth every 6 (six) hours as needed for nausea.  20 tablet  0  . simvastatin (ZOCOR) 20 MG tablet Take 20 mg by mouth at bedtime.         No current facility-administered medications for this visit.    Allergies as of 02/15/2013 - Review Complete 08/18/2012  Allergen Reaction Noted  . Ciprofloxacin hcl Other (See Comments) 05/31/2011    Vitals: There were no vitals taken for this visit. Last Weight:  Wt Readings from Last 1 Encounters:  08/27/12 155 lb 6.4 oz (70.489 kg)   Last Height:   Ht Readings from Last 1 Encounters:  08/18/12 6' (1.829 m)   Vitals: prone BP 122/76 P 76 Standing BP 124/74 P 78  Physical exam: Exam: Gen: NAD, conversant Eyes: anicteric sclerae, moist conjunctivae HENT: [place] Neck: Trachea midline; supple,  Lungs: CTA, no wheezing, rales, rhonic                          CV: RRR, no MRG Abdomen: Soft, non-tender;  Extremities: No peripheral edema  Skin: Normal temperature, no rash,  Psych: Appropriate affect, pleasant  Neuro: MS: AA&Ox3, appropriately interactive, normal affect   Speech: fluent w/o paraphasic error  Memory: good recent and remote recall  CN: PERRL, EOMI few beats horizontal nystagmus with right and left gaze, no ptosis, sensation intact to LT V1-V3 bilat, face symmetric, no weakness, hearing grossly intact, palate elevates symmetrically, shoulder shrug 5/5 bilat,  tongue protrudes midline, no fasiculations noted.  Motor: normal bulk and tone Strength: 5/5  In all extremities, some giveway weakness in bilateral upper and lower extremities  Coord: rapid alternating and point-to-point (FNF, HTS) movements intact.  Reflexes: symmetrical,  bilat downgoing toes  Sens: LT intact in all extremities, intact LT, PP, temp and proprioception bilat LE, mildly impaired vibration bilateral LE to midshin  Gait: posture, stance, stride and arm-swing normal. Few missteps with tandem walking. Able to walk on heels and toes. Romberg absent.   Assessment:  After physical and neurologic examination, review of laboratory studies, imaging,  neurophysiology testing and pre-existing records, assessment will be reviewed on the problem list.  Plan:  Treatment plan and additional workup will be reviewed under Problem List.  [name] is a pleasant 62 year old gentleman who reports for initial evaluation of intermittent paresthesias and dizziness. He reports a chronic history of his problems and does not notice much progression of his symptoms. Has a complicated past medical history with multiple pain and sensory complaints. He is a questionable remote history of multiple sclerosis, though per his wife imaging showed no signs of MS. The current cause of his symptoms is unclear based on history and physical exam. There is no signs of orthostatic hypertension. 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 Otherwise exam was unremarkable.  A sequential history of MS will plan for a brain MRI to check for MS lesions. We'll also initiate workup for peripheral neuropathy as potential cause of his instability and paresthesias.  1) peripheral neuropathy 2)Gait instability 3)Dizziness  -will check MRI of the brain w/ and w/out contrast  -peripheral neuropathy lab workup -can consider EMG/NCS in the future -consider physical therapy in the future for gait instability  -follow up in 4 months or as needed     -bring MRI C spine -EMG/NCS? -PT -MRI brain? -neuropathy workup

## 2013-02-20 LAB — PROTEIN ELECTROPHORESIS
A/G Ratio: 1.8 (ref 0.7–2.0)
Albumin ELP: 4.1 g/dL (ref 3.2–5.6)
Globulin, Total: 2.3 g/dL (ref 2.0–4.5)
Total Protein: 6.4 g/dL (ref 6.0–8.5)

## 2013-02-20 LAB — METHYLMALONIC ACID, SERUM: Methylmalonic Acid: 112 nmol/L (ref 0–378)

## 2013-02-20 LAB — PTH, INTACT AND CALCIUM
Calcium: 9.2 mg/dL (ref 8.6–10.2)
PTH: 23 pg/mL (ref 15–65)

## 2013-02-20 LAB — VITAMIN B12: Vitamin B-12: 876 pg/mL (ref 211–946)

## 2013-02-20 LAB — TSH: TSH: 3.95 u[IU]/mL (ref 0.450–4.500)

## 2013-02-20 NOTE — Progress Notes (Signed)
Quick Note:  Spoke with patient's wife and relayed results of blood work. Mrs Bramer was also reminded of any future appointments; she understood and had no questions.  ______

## 2013-03-07 ENCOUNTER — Other Ambulatory Visit: Payer: BC Managed Care – PPO

## 2013-03-08 ENCOUNTER — Other Ambulatory Visit: Payer: Self-pay

## 2013-03-08 ENCOUNTER — Other Ambulatory Visit: Payer: BC Managed Care – PPO

## 2013-03-14 ENCOUNTER — Ambulatory Visit (INDEPENDENT_AMBULATORY_CARE_PROVIDER_SITE_OTHER): Payer: BC Managed Care – PPO

## 2013-03-14 DIAGNOSIS — R269 Unspecified abnormalities of gait and mobility: Secondary | ICD-10-CM

## 2013-03-14 DIAGNOSIS — G609 Hereditary and idiopathic neuropathy, unspecified: Secondary | ICD-10-CM

## 2013-03-15 MED ORDER — GADOPENTETATE DIMEGLUMINE 469.01 MG/ML IV SOLN: 15.0000 mL | Freq: Once | INTRAVENOUS | Status: AC | PRN

## 2013-03-15 NOTE — Progress Notes (Signed)
Quick Note:  Left message at [phone] that MRI brain results were normal, per Dr. Hosie Poisson. ______

## 2013-03-27 ENCOUNTER — Telehealth: Payer: Self-pay | Admitting: Neurology

## 2013-03-28 NOTE — Telephone Encounter (Signed)
Called patient back and relayed results to his wife

## 2013-06-14 ENCOUNTER — Ambulatory Visit: Payer: BC Managed Care – PPO | Admitting: Neurology

## 2013-11-09 ENCOUNTER — Emergency Department (HOSPITAL_COMMUNITY): Payer: BC Managed Care – PPO

## 2013-11-09 ENCOUNTER — Encounter (HOSPITAL_COMMUNITY): Payer: Self-pay | Admitting: Emergency Medicine

## 2013-11-09 ENCOUNTER — Emergency Department (HOSPITAL_COMMUNITY)
Admission: EM | Admit: 2013-11-09 | Discharge: 2013-11-10 | Disposition: A | Discharge status: Home / Self Care | Payer: BC Managed Care – PPO | Attending: Emergency Medicine | Admitting: Emergency Medicine

## 2013-11-09 DIAGNOSIS — X500XXA Overexertion from strenuous movement or load, initial encounter: Secondary | ICD-10-CM | POA: Diagnosis not present

## 2013-11-09 DIAGNOSIS — Z862 Personal history of diseases of the blood and blood-forming organs and certain disorders involving the immune mechanism: Secondary | ICD-10-CM | POA: Diagnosis not present

## 2013-11-09 DIAGNOSIS — Z792 Long term (current) use of antibiotics: Secondary | ICD-10-CM | POA: Diagnosis not present

## 2013-11-09 DIAGNOSIS — S99919A Unspecified injury of unspecified ankle, initial encounter: Secondary | ICD-10-CM | POA: Diagnosis present

## 2013-11-09 DIAGNOSIS — Z872 Personal history of diseases of the skin and subcutaneous tissue: Secondary | ICD-10-CM | POA: Insufficient documentation

## 2013-11-09 DIAGNOSIS — Z8709 Personal history of other diseases of the respiratory system: Secondary | ICD-10-CM | POA: Diagnosis not present

## 2013-11-09 DIAGNOSIS — Z87891 Personal history of nicotine dependence: Secondary | ICD-10-CM | POA: Insufficient documentation

## 2013-11-09 DIAGNOSIS — Y929 Unspecified place or not applicable: Secondary | ICD-10-CM | POA: Diagnosis not present

## 2013-11-09 DIAGNOSIS — Z8701 Personal history of pneumonia (recurrent): Secondary | ICD-10-CM | POA: Insufficient documentation

## 2013-11-09 DIAGNOSIS — S8990XA Unspecified injury of unspecified lower leg, initial encounter: Secondary | ICD-10-CM | POA: Diagnosis present

## 2013-11-09 DIAGNOSIS — S93402A Sprain of unspecified ligament of left ankle, initial encounter: Secondary | ICD-10-CM

## 2013-11-09 DIAGNOSIS — Y9301 Activity, walking, marching and hiking: Secondary | ICD-10-CM | POA: Insufficient documentation

## 2013-11-09 DIAGNOSIS — Z79899 Other long term (current) drug therapy: Secondary | ICD-10-CM | POA: Insufficient documentation

## 2013-11-09 DIAGNOSIS — S93409A Sprain of unspecified ligament of unspecified ankle, initial encounter: Secondary | ICD-10-CM | POA: Insufficient documentation

## 2013-11-09 DIAGNOSIS — Z8679 Personal history of other diseases of the circulatory system: Secondary | ICD-10-CM | POA: Insufficient documentation

## 2013-11-09 DIAGNOSIS — K219 Gastro-esophageal reflux disease without esophagitis: Secondary | ICD-10-CM | POA: Insufficient documentation

## 2013-11-09 DIAGNOSIS — Z8639 Personal history of other endocrine, nutritional and metabolic disease: Secondary | ICD-10-CM | POA: Insufficient documentation

## 2013-11-09 DIAGNOSIS — M129 Arthropathy, unspecified: Secondary | ICD-10-CM | POA: Diagnosis not present

## 2013-11-09 NOTE — ED Notes (Signed)
Pt reports stepping over students that were sitting on steps, landing on her left ankle. Denies fall. Pt now reports 7/10 pain to left ankle, worse with flexion. Pt reports unable to bear pressure. Pulses intact. Pt resting comfortably in wheelchair.

## 2013-11-09 NOTE — ED Provider Notes (Signed)
CSN: [id number]     Arrival date & time 11/09/13  2208 History  This chart was scribed for non-physician practitioner, Abigail Butts, PA-C, working with Delora Fuel, MD by Ladene Artist, ED Scribe. This patient was seen in room TR07C/TR07C and the patient's care was started at 11:32 PM.    Chief Complaint  Patient presents with  . Ankle Pain    The history is provided by the patient and medical records. No language interpreter was used.   HPI Comments: [name] is a 63 y.o. male who presents to the Emergency Department complaining of gradually worsening L ankle pain onset earlier today. Pt states that he was walking down stairs at graduation and "hopped" over students who were sitting on stairs. He does not recall exactly how he landed, but states it was "wrong" and he twisted his ankle.  He ranks his pain 5/10 at rest and 10/10 with movement. He reports only very mild pain initially and he was ambulatory for several hours before pain increased. He reports he is no longer ambulatory secondary to his pain. He reports associated mild swelling to the ankle. He denies knee or hip pain. Pt has taken Tylenol with no relief.  Past Medical History  Diagnosis Date  . Pneumonia 2009  . 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 Mitral valve prolapse     does not see cardiologist for. last stress test 2003  . Hypercalcemia   . Arthritis   . GERD (gastroesophageal reflux disease)   . Hiatal hernia   . Lipoma     left forearm (3)  . Dysrhythmia     ":Due to MVP"  . Bronchitis     history of  . Tumor cells, benign     lung, left side   Past Surgical History  Procedure Laterality Date  . Cholecystectomy  2002  . Mastoidectomy  1996  . Appendectomy  1984  . Cataract extraction      L and R eye  . Parathyroidectomy    . Nasal septum surgery      sinus surgery  . Total hip revision  06/14/2011    Procedure: TOTAL HIP REVISION;  Surgeon: Kerin Salen;  Location: [facility];   Service: Orthopedics;  Laterality: Left;  Left Acetabular  Hip Revision  . Joint replacement      Lt hip   Family History  Problem Relation Age of Onset  . Cancer Father     lung  . Cancer Brother     prostate  . Heart Problems Mother   . Heart Problems Father    History  Substance Use Topics  . Smoking status: Former Smoker -- 1.50 packs/day for 24 years    Types: Cigarettes  . Smokeless tobacco: Former Systems developer     Comment: quit smoking in 1987  . Alcohol Use: 0.6 oz/week    1 Cans of beer per week    Review of Systems  Constitutional: Negative for fever and chills.  Gastrointestinal: Negative for nausea and vomiting.  Musculoskeletal: Positive for arthralgias and joint swelling. Negative for back pain, neck pain and neck stiffness.  Skin: Negative for wound.  Neurological: Negative for numbness.  Hematological: Does not bruise/bleed easily.  Psychiatric/Behavioral: The patient is not nervous/anxious.   All other systems reviewed and are negative.   Allergies  Ciprofloxacin hcl  Home Medications   Prior to Admission medications   Medication Sig Start Date End Date Taking? Authorizing Provider  amoxicillin (AMOXIL)  500 MG tablet Take 500 mg by mouth 4 (four) times daily.    Historical Provider, MD  buPROPion (WELLBUTRIN SR) 150 MG 12 hr tablet Take 300 mg by mouth 2 (two) times daily.    Historical Provider, MD  Coenzyme Q10 (COQ-10) 200 MG CAPS Take 1 capsule by mouth daily.    Historical Provider, MD  FLUoxetine (PROZAC) 40 MG capsule Take 40 mg by mouth daily.      Historical Provider, MD  ibuprofen (ADVIL,MOTRIN) 800 MG tablet Take 800 mg by mouth every 8 (eight) hours as needed for pain.    Historical Provider, MD  methylphenidate (RITALIN) 10 MG tablet Take 10 mg by mouth 3 (three) times daily.      Historical Provider, MD  Multiple Vitamin (MULTIVITAMIN) capsule Take 1 capsule by mouth daily.      Historical Provider, MD  omeprazole (PRILOSEC) 20 MG capsule Take 40  mg by mouth daily.     Historical Provider, MD  simvastatin (ZOCOR) 20 MG tablet Take 20 mg by mouth at bedtime.      Historical Provider, MD   Triage Vitals: BP 132/70  Pulse 78  Temp(Src) 98 F (36.7 C) (Oral)  Resp 16  Ht 6' (1.829 m)  Wt 165 lb (74.844 kg)  BMI 22.37 kg/m2  SpO2 98% Physical Exam  Nursing note and vitals reviewed. Constitutional: He is oriented to person, place, and time. He appears well-developed and well-nourished. No distress.  HENT:  Head: Normocephalic and atraumatic.  Eyes: Conjunctivae are normal.  Neck: Normal range of motion.  Cardiovascular: Normal rate, regular rhythm, normal heart sounds and intact distal pulses.   No murmur heard. Capillary refill less than 3 seconds Intact DP and PT pulses   Pulmonary/Chest: Effort normal and breath sounds normal. No respiratory distress. He has no wheezes.  Musculoskeletal: He exhibits tenderness. He exhibits no edema.       Left ankle: He exhibits decreased range of motion and swelling. He exhibits no ecchymosis, no deformity, no laceration and normal pulse. Tenderness. No lateral malleolus (lateral and medial) and no medial malleolus tenderness found. Achilles tendon exhibits no pain, no defect and normal Thompson's test results.  ROM: Somewhat limited in L ankle due to pain Full ROM of L knee and L ankle  Mild swelling to the medial and lateral sides of the left ankle, no pain to palpation of the medial or lateral malleolus; no wounds  No pain to palpation of the Achilles tendon, negative Thompson's test  Neurological: He is alert and oriented to person, place, and time. Coordination normal.  Sensation intact to dull and sharp Strength 3/5 dorsal flexion and plantar flexion due to pain 5/5 in L knee and L hip   Skin: Skin is warm and dry. He is not diaphoretic. No erythema.  No tenting of the skin  Psychiatric: He has a normal mood and affect.    ED Course  Procedures (including critical care  time) DIAGNOSTIC STUDIES: Oxygen Saturation is 98% on RA, normal by my interpretation.    COORDINATION OF CARE: 11:39 PM Discussed treatment plan with pt at bedside and pt agreed to plan.  Labs Review Labs Reviewed - No data to display  Imaging Review Dg Ankle Complete Left  11/10/2013   CLINICAL DATA:  ANKLE PAIN ankle pain. Posterior ankle pain with swelling.  EXAM: LEFT ANKLE COMPLETE - 3+ VIEW  COMPARISON:  None.  FINDINGS: There is no evidence of fracture, dislocation, or joint effusion. There is no  evidence of arthropathy or other focal bone abnormality. Soft tissues are unremarkable.  IMPRESSION: Negative.   Electronically Signed   By: Dereck Ligas M.D.   On: 11/10/2013 00:01     EKG Interpretation None      MDM   Final diagnoses:  Left ankle sprain   [name] presents with left ankle pain.  Patient X-Ray negative for obvious fracture or dislocation. I personally reviewed the imaging tests through PACS system.  I reviewed available ER/hospitalization records through the EMR.    Pain managed in ED. Pt advised to follow up with orthopedics if symptoms persist for possibility of missed fracture diagnosis. Patient given ASO brace and crutches while in ED, conservative therapy recommended and discussed. Pt is weight bearing as tolerated.  Patient will be dc home & is agreeable with above plan.   Recommend return to the emergency department for significant swelling, high fevers or increased pain.  It has been determined that no acute conditions requiring further emergency intervention are present at this time. The patient/guardian have been advised of the diagnosis and plan. We have discussed signs and symptoms that warrant return to the ED, such as changes or worsening in symptoms.   Vital signs are stable at discharge.   BP 132/70  Pulse 78  Temp(Src) 98 F (36.7 C) (Oral)  Resp 16  Ht 6' (1.829 m)  Wt 165 lb (74.844 kg)  BMI 22.37 kg/m2  SpO2  98%  Patient/guardian has voiced understanding and agreed to follow-up with the PCP or specialist.   I personally performed the services described in this documentation, which was scribed in my presence. The recorded information has been reviewed and is accurate.    Jarrett Soho Bryant Lipps, PA-C 11/10/13 [phone]

## 2013-11-09 NOTE — ED Notes (Signed)
Pt states that he twisted his ankle walking down some stairs.

## 2013-11-10 MED ORDER — HYDROCODONE-ACETAMINOPHEN 5-325 MG PO TABS
1.0000 | ORAL_TABLET | Freq: Once | ORAL | Status: AC
  Administered 2013-11-10: 1 via ORAL
  Filled 2013-11-10: qty 1

## 2013-11-10 MED ORDER — HYDROCODONE-ACETAMINOPHEN 5-325 MG PO TABS: 1.0000 | ORAL_TABLET | Freq: Four times a day (QID) | ORAL | Status: DC | PRN

## 2013-11-10 NOTE — ED Provider Notes (Signed)
63 year old male who came to the ED having twisted his left ankle in a fall. He has not been able to ambulate on the ankle since then. On exam, there is no significant swelling of the ankle but it is tender over the lateral aspect. Incidentally, it is noted that he has had significant weight loss recently. X-rays are negative for fracture reviewed by myself. He was treated with ASO and crutches but advised to follow up with PCP regarding the weight loss.  Medical screening examination/treatment/procedure(s) were conducted as a shared visit with non-physician practitioner(s) and myself.  I personally evaluated the patient during the encounter.   Delora Fuel, MD [phone]

## 2013-11-10 NOTE — ED Notes (Signed)
Ortho paged. 

## 2013-11-10 NOTE — Discharge Instructions (Signed)
1. Medications: vicodin for severe pain (do not combine with extra tylenol), usual home medications 2. Treatment: rest, drink plenty of fluids, wear ASO brace 3. Follow Up: Please followup with your primary doctor for discussion of your diagnoses and further evaluation after today's visit; if you do not have a primary care doctor use the resource guide provided to find one;     Ankle Sprain An ankle sprain is an injury to the strong, fibrous tissues (ligaments) that hold the bones of your ankle joint together.  CAUSES An ankle sprain is usually caused by a fall or by twisting your ankle. Ankle sprains most commonly occur when you step on the outer edge of your foot, and your ankle turns inward. People who participate in sports are more prone to these types of injuries.  SYMPTOMS   Pain in your ankle. The pain may be present at rest or only when you are trying to stand or walk.  Swelling.  Bruising. Bruising may develop immediately or within 1 to 2 days after your injury.  Difficulty standing or walking, particularly when turning corners or changing directions. DIAGNOSIS  Your caregiver will ask you details about your injury and perform a physical exam of your ankle to determine if you have an ankle sprain. During the physical exam, your caregiver will press on and apply pressure to specific areas of your foot and ankle. Your caregiver will try to move your ankle in certain ways. An X-ray exam may be done to be sure a bone was not broken or a ligament did not separate from one of the bones in your ankle (avulsion fracture).  TREATMENT  Certain types of braces can help stabilize your ankle. Your caregiver can make a recommendation for this. Your caregiver may recommend the use of medicine for pain. If your sprain is severe, your caregiver may refer you to a surgeon who helps to restore function to parts of your skeletal system (orthopedist) or a physical therapist. Dexter ice to your injury for 1 2 days or as directed by your caregiver. Applying ice helps to reduce inflammation and pain.  Put ice in a plastic bag.  Place a towel between your skin and the bag.  Leave the ice on for 15-20 minutes at a time, every 2 hours while you are awake.  Only take over-the-counter or prescription medicines for pain, discomfort, or fever as directed by your caregiver.  Elevate your injured ankle above the level of your heart as much as possible for 2 3 days.  If your caregiver recommends crutches, use them as instructed. Gradually put weight on the affected ankle. Continue to use crutches or a cane until you can walk without feeling pain in your ankle.  If you have a plaster splint, wear the splint as directed by your caregiver. Do not rest it on anything harder than a pillow for the first 24 hours. Do not put weight on it. Do not get it wet. You may take it off to take a shower or bath.  You may have been given an elastic bandage to wear around your ankle to provide support. If the elastic bandage is too tight (you have numbness or tingling in your foot or your foot becomes cold and blue), adjust the bandage to make it comfortable.  If you have an air splint, you may blow more air into it or let air out to make it more comfortable. You may take your splint off  at night and before taking a shower or bath. Wiggle your toes in the splint several times per day to decrease swelling. SEEK MEDICAL CARE IF:   You have rapidly increasing bruising or swelling.  Your toes feel extremely cold or you lose feeling in your foot.  Your pain is not relieved with medicine. SEEK IMMEDIATE MEDICAL CARE IF:  Your toes are numb or blue.  You have severe pain that is increasing. MAKE SURE YOU:   Understand these instructions.  Will watch your condition.  Will get help right away if you are not doing well or get worse. Document Released: 06/21/2005 Document Revised:  03/15/2012 Document Reviewed: 07/03/2011 [facility] Patient Information 2014 [place], [place].

## 2014-01-08 ENCOUNTER — Encounter (INDEPENDENT_AMBULATORY_CARE_PROVIDER_SITE_OTHER): Payer: Self-pay | Admitting: General Surgery

## 2014-01-08 ENCOUNTER — Encounter (INDEPENDENT_AMBULATORY_CARE_PROVIDER_SITE_OTHER): Payer: Self-pay

## 2014-01-08 ENCOUNTER — Ambulatory Visit (INDEPENDENT_AMBULATORY_CARE_PROVIDER_SITE_OTHER): Payer: BC Managed Care – PPO | Admitting: General Surgery

## 2014-01-08 VITALS — BP 120/70 | HR 60 | Resp 16 | Ht 72.0 in | Wt 167.0 lb

## 2014-01-08 DIAGNOSIS — K4020 Bilateral inguinal hernia, without obstruction or gangrene, not specified as recurrent: Secondary | ICD-10-CM

## 2014-01-08 NOTE — Progress Notes (Signed)
Patient ID: [name], male   DOB: Jul 18, 1950, 63 y.o.   MRN: [id number]  Chief Complaint  Patient presents with  . Inguinal Hernia    HPI [name] is a 63 y.o. male.   HPI He is referred by Dr. Stephanie Acre because of bilateral inguinal hernias.  He has an intermittent groin pain on both sides. He has been felt to have bilateral inguinal hernias on exam. He does have difficulty with urination. He states he's not responded well to medications for this. His urologist is Dr. Diona Fanti.  He does have some intermittent constipation as well. No family history of hernia.  His wife is with him.  Past Medical History  Diagnosis Date  . Pneumonia 2009  . 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 Mitral valve prolapse     does not see cardiologist for. last stress test 2003  . Hypercalcemia   . Arthritis   . GERD (gastroesophageal reflux disease)   . Lipoma     left forearm (3)  . Dysrhythmia     ":Due to MVP"  . Bronchitis     history of  . Tumor cells, benign     lung, left side  . Hiatal hernia      Erectile dysfunction   Hyperparathyroidism    Melanoma  Past Surgical History  Procedure Laterality Date  . Cholecystectomy  2002  . Mastoidectomy  1996  . Appendectomy  1984  . Cataract extraction      L and R eye  . Parathyroidectomy    . Nasal septum surgery      sinus surgery  . Total hip revision  06/14/2011    Procedure: TOTAL HIP REVISION;  Surgeon: Kerin Salen;  Location: [facility];  Service: Orthopedics;  Laterality: Left;  Left Acetabular  Hip Revision  . Joint replacement      Lt hip    Family History  Problem Relation Age of Onset  . Cancer Father     lung  . Cancer Brother     prostate  . Heart Problems Mother   . Heart Problems Father     Social History History  Substance Use Topics  . Smoking status: Former Smoker -- 1.50 packs/day for 24 years    Types: Cigarettes  . Smokeless tobacco: Former Systems developer     Comment: quit smoking in 1987  .  Alcohol Use: 0.6 oz/week    1 Cans of beer per week    Allergies  Allergen Reactions  . Ciprofloxacin Hcl Other (See Comments)    Burning in ear when drops were used    Current Outpatient Prescriptions  Medication Sig Dispense Refill  . buPROPion (WELLBUTRIN SR) 150 MG 12 hr tablet Take 300 mg by mouth 2 (two) times daily.      . Coenzyme Q10 (COQ-10) 200 MG CAPS Take 1 capsule by mouth daily.      Marland Kitchen FLUoxetine (PROZAC) 40 MG capsule Take 40 mg by mouth daily.        Marland Kitchen HYDROcodone-acetaminophen (NORCO/VICODIN) 5-325 MG per tablet Take 1 tablet by mouth every 6 (six) hours as needed.  6 tablet  0  . methylphenidate (RITALIN) 10 MG tablet Take 10 mg by mouth 3 (three) times daily.        . Multiple Vitamin (MULTIVITAMIN WITH MINERALS) TABS tablet Take 1 tablet by mouth daily.      Marland Kitchen omeprazole (PRILOSEC) 20 MG capsule Take 40 mg by mouth daily.       Marland Kitchen  simvastatin (ZOCOR) 20 MG tablet Take 20 mg by mouth at bedtime.         No current facility-administered medications for this visit.    Review of Systems Review of Systems  Constitutional: Negative.   HENT: Positive for hearing loss.   Respiratory: Negative.   Cardiovascular: Negative.   Gastrointestinal: Positive for constipation.  Genitourinary: Positive for difficulty urinating.  Hematological: Negative.     Blood pressure 120/70, pulse 60, resp. rate 16, height 6' (1.829 m), weight 167 lb (75.751 kg).  Physical Exam Physical Exam  Constitutional: He appears well-developed and well-nourished. No distress.  HENT:  Head: Normocephalic and atraumatic.  Neck:  Lower transverse scar  Cardiovascular: Normal rate and regular rhythm.   Pulmonary/Chest: Effort normal and breath sounds normal.  Abdominal: Soft.  Lower midline scar  Genitourinary:  Bilateral reducible inguinal bulges, left larger than right.    Neurological: He is alert.  Skin: Skin is warm and dry.  Psychiatric: He has a normal mood and affect. His  behavior is normal.    Data Reviewed Notes from Dr. Stephanie Acre  Assessment    Bilateral inguinal hernias left larger than the right side. Has some intermittent discomfort. Has problems with urination and intermittent constipation.     Plan    We discussed bilateral open repair with mesh versus staged repair (left side first, followed by the right side in the future), or observation.  I did not recommend observation since he is symptomatic.  I explained the procedure, risks, and aftercare of inguinal hernia repair.  Risks include but are not limited to bleeding, infection, wound problems, anesthesia, recurrence, bladder or intestine injury, urinary retention, testicular dysfunction, chronic pain, mesh problems.  He seems to understand.  He would like to think about it and then call back to schedule. His biggest risk in my mind is urinary retention.   Haly Feher J 01/08/2014, 9:59 AM

## 2014-01-08 NOTE — Patient Instructions (Signed)
Call when you would like to schedule the surgery you have chosen.  [phone], ask for surgery scheduling.    [facility] _______[facility]  UMBILICAL OR INGUINAL HERNIA REPAIR: POST OP INSTRUCTIONS  Always review your discharge instruction sheet given to you by the facility where your surgery was performed. IF YOU HAVE DISABILITY OR FAMILY LEAVE FORMS, YOU MUST BRING THEM TO THE OFFICE FOR PROCESSING.   DO NOT GIVE THEM TO YOUR DOCTOR.  1. A  prescription for pain medication may be given to you upon discharge.  Take your pain medication as prescribed, if needed.  If narcotic pain medicine is not needed, then you may take acetaminophen (Tylenol) or ibuprofen (Advil) as needed. 2. Take your usually prescribed medications unless otherwise directed. 3. If you need a refill on your pain medication, please contact your pharmacy.  They will contact our office to request authorization. Prescriptions will not be filled after 5 pm or on week-ends. 4. You should follow a light diet the first 24 hours after arrival home, such as soup and crackers, etc.  Be sure to include lots of fluids daily.  Resume your normal diet the day after surgery. 5. Most patients will experience some swelling and bruising around the umbilicus or in the groin and scrotum.  Ice packs and reclining will help.  Swelling and bruising can take several days to resolve.  6. It is common to experience some constipation if taking pain medication after surgery.  Increasing fluid intake and taking a stool softener (such as Colace) will usually help or prevent this problem from occurring.  A mild laxative (Milk of Magnesia or Miralax) should be taken according to package directions if there are no bowel movements after 48 hours. 7. Unless discharge instructions indicate otherwise, you may remove your bandages 24-48 hours after surgery, and you may shower at that time.  You may have steri-strips (small skin tapes) in place directly over  the incision.  These strips should be left on the skin for 7-10 days.  If your surgeon used skin glue on the incision, you may shower in 24 hours.  The glue will flake off over the next 2-3 weeks.  Any sutures or staples will be removed at the office during your follow-up visit. 8. ACTIVITIES:  You may resume regular (light) daily activities beginning the next day-such as daily self-care, walking, climbing stairs-gradually increasing activities as tolerated.  You may have sexual intercourse when it is comfortable.  Refrain from any heavy lifting or straining until approved by your doctor. a. You may drive when you are no longer taking prescription pain medication, you can comfortably wear a seatbelt, and you can safely maneuver your car and apply brakes. b. RETURN TO WORK:  __________________________________________________________ 9. You should see your doctor in the office for a follow-up appointment approximately 2-3 weeks after your surgery.  Make sure that you call for this appointment within a day or two after you arrive home to insure a convenient appointment time. 10. OTHER INSTRUCTIONS:  __________________________________________________________________________________________________________________________________________________________________________________________  WHEN TO CALL YOUR DOCTOR: 1. Fever over 101.0 2. Inability to urinate 3. Nausea and/or vomiting 4. Extreme swelling or bruising 5. Continued bleeding from incision. 6. Increased pain, redness, or drainage from the incision  The clinic staff is available to answer your questions during regular business hours.  Please don't hesitate to call and ask to speak to one of the nurses for clinical concerns.  If you have a medical emergency, go to the nearest  emergency room or call 911.  A surgeon from [facility] is always on call at the hospital   [address], [place], [place], [place]  [address] ?  [address], [place], [place]   [address] [phone] ? [phone] ? FAX [phone] Web site: [email or web address]

## 2015-04-09 DIAGNOSIS — D229 Melanocytic nevi, unspecified: Secondary | ICD-10-CM | POA: Diagnosis not present

## 2015-04-09 DIAGNOSIS — L812 Freckles: Secondary | ICD-10-CM | POA: Diagnosis not present

## 2015-04-09 DIAGNOSIS — Z85820 Personal history of malignant melanoma of skin: Secondary | ICD-10-CM | POA: Diagnosis not present

## 2015-04-09 DIAGNOSIS — D1801 Hemangioma of skin and subcutaneous tissue: Secondary | ICD-10-CM | POA: Diagnosis not present

## 2015-04-09 DIAGNOSIS — L821 Other seborrheic keratosis: Secondary | ICD-10-CM | POA: Diagnosis not present

## 2015-04-15 DIAGNOSIS — Z23 Encounter for immunization: Secondary | ICD-10-CM | POA: Diagnosis not present

## 2015-04-25 ENCOUNTER — Emergency Department (HOSPITAL_COMMUNITY): Payer: Medicare Other

## 2015-04-25 ENCOUNTER — Emergency Department (HOSPITAL_COMMUNITY)
Admission: EM | Admit: 2015-04-25 | Discharge: 2015-04-25 | Disposition: A | Discharge status: Home / Self Care | Payer: Medicare Other | Attending: Emergency Medicine | Admitting: Emergency Medicine

## 2015-04-25 ENCOUNTER — Encounter (HOSPITAL_COMMUNITY): Payer: Self-pay | Admitting: Cardiology

## 2015-04-25 DIAGNOSIS — M1990 Unspecified osteoarthritis, unspecified site: Secondary | ICD-10-CM | POA: Diagnosis not present

## 2015-04-25 DIAGNOSIS — Z8701 Personal history of pneumonia (recurrent): Secondary | ICD-10-CM | POA: Diagnosis not present

## 2015-04-25 DIAGNOSIS — Z87891 Personal history of nicotine dependence: Secondary | ICD-10-CM | POA: Diagnosis not present

## 2015-04-25 DIAGNOSIS — R0789 Other chest pain: Secondary | ICD-10-CM | POA: Insufficient documentation

## 2015-04-25 DIAGNOSIS — R079 Chest pain, unspecified: Secondary | ICD-10-CM | POA: Diagnosis not present

## 2015-04-25 DIAGNOSIS — Z79899 Other long term (current) drug therapy: Secondary | ICD-10-CM | POA: Diagnosis not present

## 2015-04-25 LAB — I-STAT TROPONIN, ED
TROPONIN I, POC: 0 ng/mL (ref 0.00–0.08)
Troponin i, poc: 0 ng/mL (ref 0.00–0.08)

## 2015-04-25 LAB — CBC
HEMATOCRIT: 43.9 % (ref 39.0–52.0)
HEMOGLOBIN: 15 g/dL (ref 13.0–17.0)
MCH: 31.4 pg (ref 26.0–34.0)
MCHC: 34.2 g/dL (ref 30.0–36.0)
MCV: 92 fL (ref 78.0–100.0)
Platelets: 204 10*3/uL (ref 150–400)
RBC: 4.77 MIL/uL (ref 4.22–5.81)
RDW: 11.8 % (ref 11.5–15.5)
WBC: 6.5 10*3/uL (ref 4.0–10.5)

## 2015-04-25 LAB — BASIC METABOLIC PANEL
Anion gap: 9 (ref 5–15)
BUN: 17 mg/dL (ref 6–20)
CHLORIDE: 104 mmol/L (ref 101–111)
CO2: 29 mmol/L (ref 22–32)
Calcium: 9.5 mg/dL (ref 8.9–10.3)
Creatinine, Ser: 0.98 mg/dL (ref 0.61–1.24)
GFR calc Af Amer: >60 mL/min (ref >60)
GFR calc non Af Amer: >60 mL/min (ref >60)
Glucose, Bld: 99 mg/dL (ref 65–99)
POTASSIUM: 4.8 mmol/L (ref 3.5–5.1)
SODIUM: 142 mmol/L (ref 135–145)

## 2015-04-25 LAB — TSH: TSH: 3.87 u[IU]/mL (ref 0.350–4.500)

## 2015-04-25 NOTE — ED Notes (Signed)
Reports increased chest pain over the past 3 weeks that gotten worse in the past couple of days. Denies any SOB or n/v, reports the pain comes and goes at all different times of the day.

## 2015-04-25 NOTE — ED Provider Notes (Signed)
CSN: [id number]     Arrival date & time 04/25/15  1657 History   First MD Initiated Contact with Patient 04/25/15 1849     Chief Complaint  Patient presents with  . Chest Pain     (Consider location/radiation/quality/duration/timing/severity/associated sxs/prior Treatment) HPI Comments: Pt comes to the ER from GI doctor with cc of chest pain. Pt has no CAD hx. Pt reports that for the last 2 months he has been having some midsternal chest pain, off and on. Pt reports that the frequency of the pain is getting more severe, and so is the intensity, so he saw his GI doctor - who sent him to the ER for cardiac workup. Pt has no associated nausea, dib, diaphoresis. Pain is not provoked or aggravated by exertion. Pt's pain is not post prandial. Pt's pain is non radiating.  Patient is a 64 y.o. male presenting with chest pain. The history is provided by the patient.  Chest Pain Associated symptoms: no abdominal pain, no cough and no shortness of breath     Past Medical History  Diagnosis Date  . Pneumonia 2009  . 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 Mitral valve prolapse     does not see cardiologist for. last stress test 2003  . Hypercalcemia   . Arthritis   . GERD (gastroesophageal reflux disease)   . Lipoma     left forearm (3)  . Dysrhythmia     ":Due to MVP"  . Bronchitis     history of  . Tumor cells, benign     lung, left side  . Hiatal hernia    Past Surgical History  Procedure Laterality Date  . Cholecystectomy  2002  . Mastoidectomy  1996  . Appendectomy  1984  . Cataract extraction      L and R eye  . Parathyroidectomy    . Nasal septum surgery      sinus surgery  . Total hip revision  06/14/2011    Procedure: TOTAL HIP REVISION;  Surgeon: Kerin Salen;  Location: [facility];  Service: Orthopedics;  Laterality: Left;  Left Acetabular  Hip Revision  . Joint replacement      Lt hip   Family History  Problem Relation Age of Onset  . Cancer Father     lung   . Cancer Brother     prostate  . Heart Problems Mother   . Heart Problems Father    Social History  Substance Use Topics  . Smoking status: Former Smoker -- 1.50 packs/day for 24 years    Types: Cigarettes  . Smokeless tobacco: Former Systems developer     Comment: quit smoking in 1987  . Alcohol Use: 0.6 oz/week    1 Cans of beer per week    Review of Systems  Constitutional: Negative for activity change and appetite change.  Respiratory: Negative for cough and shortness of breath.   Cardiovascular: Positive for chest pain.  Gastrointestinal: Negative for abdominal pain.  Genitourinary: Negative for dysuria.      Allergies  Ciprofloxacin hcl  Home Medications   Prior to Admission medications   Medication Sig Start Date End Date Taking? Authorizing Provider  buPROPion (WELLBUTRIN SR) 150 MG 12 hr tablet Take 150 mg by mouth 2 (two) times daily.    Yes Historical Provider, MD  Coenzyme Q10 (COQ-10) 200 MG CAPS Take 1 capsule by mouth daily.   Yes Historical Provider, MD  docusate sodium (COLACE) 100 MG capsule Take 200 mg  by mouth daily.   Yes Historical Provider, MD  [place] 2 each by mouth daily.   Yes Historical Provider, MD  FLUoxetine (PROZAC) 40 MG capsule Take 40 mg by mouth daily.     Yes Historical Provider, MD  methylphenidate (RITALIN) 10 MG tablet Take 10 mg by mouth 3 (three) times daily.     Yes Historical Provider, MD  Multiple Vitamin (MULTIVITAMIN WITH MINERALS) TABS tablet Take 1 tablet by mouth daily.   Yes Historical Provider, MD  omeprazole (PRILOSEC) 20 MG capsule Take 40 mg by mouth daily.    Yes Historical Provider, MD  Probiotic Product (PROBIOTIC DAILY) CAPS Take 1 capsule by mouth daily.   Yes Historical Provider, MD  simvastatin (ZOCOR) 20 MG tablet Take 20 mg by mouth at bedtime.     Yes Historical Provider, MD  HYDROcodone-acetaminophen (NORCO/VICODIN) 5-325 MG per tablet Take 1 tablet by mouth every 6 (six) hours as needed. 11/10/13    Hannah Muthersbaugh, PA-C  sucralfate (CARAFATE) 1 G tablet Take 1 g by mouth 4 (four) times daily - after meals and at bedtime. 04/25/15   Historical Provider, MD   BP 110/59 mmHg  Pulse 63  Temp(Src) 97.7 F (36.5 C) (Oral)  Resp 16  Wt 167 lb (75.751 kg)  SpO2 96% Physical Exam  Constitutional: He is oriented to person, place, and time. He appears well-developed.  HENT:  Head: Normocephalic and atraumatic.  Eyes: Conjunctivae and EOM are normal. Pupils are equal, round, and reactive to light.  Neck: Normal range of motion. Neck supple.  Cardiovascular: Normal rate, regular rhythm and intact distal pulses.   Pulmonary/Chest: Effort normal and breath sounds normal.  Abdominal: Soft. Bowel sounds are normal. He exhibits no distension. There is tenderness. There is no rebound and no guarding.  Neurological: He is alert and oriented to person, place, and time.  Skin: Skin is warm.  Nursing note and vitals reviewed.   ED Course  Procedures (including critical care time) Labs Review Labs Reviewed  BASIC METABOLIC PANEL  CBC  TSH  I-STAT [place], ED  [facility], ED    Imaging Review Dg Chest 2 View  04/25/2015  CLINICAL DATA:  Chest pain for 3 weeks and worsening EXAM: CHEST - 2 VIEW COMPARISON:  08/17/2012 FINDINGS: The heart size and mediastinal contours are within normal limits. Both lungs are clear. The visualized skeletal structures are unremarkable. IMPRESSION: No active disease. Electronically Signed   By: Inez Catalina M.D.   On: 04/25/2015 17:49   I have personally reviewed and evaluated these images and lab results as part of my medical decision-making.   EKG Interpretation   Date/Time:  Friday April 25 2015 17:01:30 EDT Ventricular Rate:  91 PR Interval:  136 QRS Duration: 82 QT Interval:  366 QTC Calculation: 450 R Axis:   96 Text Interpretation:  Normal sinus rhythm Rightward axis Borderline ECG No  acute changes No significant change since last  tracing Confirmed by  Aleanna Menge, MD, Thelma Comp [phone]) on 04/25/2015 7:00:49 PM      MDM   Final diagnoses:  Atypical chest pain    Pt comes in with cc of chest pain. Hx of esophagitis. DDX: esophagitis, PUD, ACS, dissection  Pain is not consistent with esophagitis, or ACS. HEAR score is 3 - AGE x 2 and risk factor x 1 (family hx of CAD). Vascular exam is normal.  He has family hx of CAD, in age 69s. At some point he had L  sided elbow pain. I think ACS is less likely, and GI is more probable - but since the GI team is sending him for Cards eval - i think it is most definitely worthwhile to get pt see Cards as an outpatient if trop x 2 are neg. Pt is chest pain free currently.      Varney Biles, MD 04/25/15 2355

## 2015-04-25 NOTE — Discharge Instructions (Signed)
We saw you in the ER for the chest pain/shortness of breath. All of our cardiac workup is normal, including labs, EKG and chest X-RAY are normal. We are not sure what is causing your discomfort, but we feel comfortable sending you home at this time. The workup in the ER is not complete, and you should follow up with your primary care doctor for further evaluation.  WE HAVE MESSAGED CARDIOLOGIST TO CALL YOU - EXPECT A CALL ON Monday. If they dont call you, please call the number we have provided.  Please return to the ER if you have worsening chest pain, shortness of breath, pain radiating to your jaw, shoulder, or back, sweats or fainting. Otherwise see the Cardiologist or your primary care doctor as requested.    Nonspecific Chest Pain  Chest pain can be caused by many different conditions. There is always a chance that your pain could be related to something serious, such as a heart attack or a blood clot in your lungs. Chest pain can also be caused by conditions that are not life-threatening. If you have chest pain, it is very important to follow up with your health care provider. CAUSES  Chest pain can be caused by:  Heartburn.  Pneumonia or bronchitis.  Anxiety or stress.  Inflammation around your heart (pericarditis) or lung (pleuritis or pleurisy).  A blood clot in your lung.  A collapsed lung (pneumothorax). It can develop suddenly on its own (spontaneous pneumothorax) or from trauma to the chest.  Shingles infection (varicella-zoster virus).  Heart attack.  Damage to the bones, muscles, and cartilage that make up your chest wall. This can include:  Bruised bones due to injury.  Strained muscles or cartilage due to frequent or repeated coughing or overwork.  Fracture to one or more ribs.  Sore cartilage due to inflammation (costochondritis). RISK FACTORS  Risk factors for chest pain may include:  Activities that increase your risk for trauma or injury to your  chest.  Respiratory infections or conditions that cause frequent coughing.  Medical conditions or overeating that can cause heartburn.  Heart disease or family history of heart disease.  Conditions or health behaviors that increase your risk of developing a blood clot.  Having had chicken pox (varicella zoster). SIGNS AND SYMPTOMS Chest pain can feel like:  Burning or tingling on the surface of your chest or deep in your chest.  Crushing, pressure, aching, or squeezing pain.  Dull or sharp pain that is worse when you move, cough, or take a deep breath.  Pain that is also felt in your back, neck, shoulder, or arm, or pain that spreads to any of these areas. Your chest pain may come and go, or it may stay constant. DIAGNOSIS Lab tests or other studies may be needed to find the cause of your pain. Your health care provider may have you take a test called an ambulatory ECG (electrocardiogram). An ECG records your heartbeat patterns at the time the test is performed. You may also have other tests, such as:  Transthoracic echocardiogram (TTE). During echocardiography, sound waves are used to create a picture of all of the heart structures and to look at how blood flows through your heart.  Transesophageal echocardiogram (TEE).This is a more advanced imaging test that obtains images from inside your body. It allows your health care provider to see your heart in finer detail.  Cardiac monitoring. This allows your health care provider to monitor your heart rate and rhythm in real time.  Holter monitor. This is a portable device that records your heartbeat and can help to diagnose abnormal heartbeats. It allows your health care provider to track your heart activity for several days, if needed.  Stress tests. These can be done through exercise or by taking medicine that makes your heart beat more quickly.  Blood tests.  Imaging tests. TREATMENT  Your treatment depends on what is causing  your chest pain. Treatment may include:  Medicines. These may include:  Acid blockers for heartburn.  Anti-inflammatory medicine.  Pain medicine for inflammatory conditions.  Antibiotic medicine, if an infection is present.  Medicines to dissolve blood clots.  Medicines to treat coronary artery disease.  Supportive care for conditions that do not require medicines. This may include:  Resting.  Applying heat or cold packs to injured areas.  Limiting activities until pain decreases. HOME CARE INSTRUCTIONS  If you were prescribed an antibiotic medicine, finish it all even if you start to feel better.  Avoid any activities that bring on chest pain.  Do not use any tobacco products, including cigarettes, chewing tobacco, or electronic cigarettes. If you need help quitting, ask your health care provider.  Do not drink alcohol.  Take medicines only as directed by your health care provider.  Keep all follow-up visits as directed by your health care provider. This is important. This includes any further testing if your chest pain does not go away.  If heartburn is the cause for your chest pain, you may be told to keep your head raised (elevated) while sleeping. This reduces the chance that acid will go from your stomach into your esophagus.  Make lifestyle changes as directed by your health care provider. These may include:  Getting regular exercise. Ask your health care provider to suggest some activities that are safe for you.  Eating a heart-healthy diet. A registered dietitian can help you to learn healthy eating options.  Maintaining a healthy weight.  Managing diabetes, if necessary.  Reducing stress. SEEK MEDICAL CARE IF:  Your chest pain does not go away after treatment.  You have a rash with blisters on your chest.  You have a fever. SEEK IMMEDIATE MEDICAL CARE IF:   Your chest pain is worse.  You have an increasing cough, or you cough up blood.  You  have severe abdominal pain.  You have severe weakness.  You faint.  You have chills.  You have sudden, unexplained chest discomfort.  You have sudden, unexplained discomfort in your arms, back, neck, or jaw.  You have shortness of breath at any time.  You suddenly start to sweat, or your skin gets clammy.  You feel nauseous or you vomit.  You suddenly feel light-headed or dizzy.  Your heart begins to beat quickly, or it feels like it is skipping beats. These symptoms may represent a serious problem that is an emergency. Do not wait to see if the symptoms will go away. Get medical help right away. Call your local emergency services (911 in the [place] Do not drive yourself to the hospital.   This information is not intended to replace advice given to you by your health care provider. Make sure you discuss any questions you have with your health care provider.   Document Released: 03/31/2005 Document Revised: 07/12/2014 Document Reviewed: 01/25/2014 Elsevier Interactive Patient Education [employer].

## 2015-05-02 ENCOUNTER — Emergency Department (HOSPITAL_COMMUNITY)
Admission: EM | Admit: 2015-05-02 | Discharge: 2015-05-02 | Disposition: A | Discharge status: Home / Self Care | Payer: Medicare Other | Attending: Emergency Medicine | Admitting: Emergency Medicine

## 2015-05-02 ENCOUNTER — Encounter (HOSPITAL_COMMUNITY): Payer: Self-pay | Admitting: *Deleted

## 2015-05-02 ENCOUNTER — Emergency Department (HOSPITAL_COMMUNITY): Payer: Medicare Other

## 2015-05-02 DIAGNOSIS — Z8739 Personal history of other diseases of the musculoskeletal system and connective tissue: Secondary | ICD-10-CM | POA: Insufficient documentation

## 2015-05-02 DIAGNOSIS — Z8639 Personal history of other endocrine, nutritional and metabolic disease: Secondary | ICD-10-CM | POA: Insufficient documentation

## 2015-05-02 DIAGNOSIS — Z87891 Personal history of nicotine dependence: Secondary | ICD-10-CM | POA: Diagnosis not present

## 2015-05-02 DIAGNOSIS — Z79899 Other long term (current) drug therapy: Secondary | ICD-10-CM | POA: Diagnosis not present

## 2015-05-02 DIAGNOSIS — K2270 Barrett's esophagus without dysplasia: Secondary | ICD-10-CM | POA: Diagnosis not present

## 2015-05-02 DIAGNOSIS — Z86018 Personal history of other benign neoplasm: Secondary | ICD-10-CM | POA: Insufficient documentation

## 2015-05-02 DIAGNOSIS — Z8709 Personal history of other diseases of the respiratory system: Secondary | ICD-10-CM | POA: Insufficient documentation

## 2015-05-02 DIAGNOSIS — R0789 Other chest pain: Secondary | ICD-10-CM

## 2015-05-02 DIAGNOSIS — Z8679 Personal history of other diseases of the circulatory system: Secondary | ICD-10-CM | POA: Diagnosis not present

## 2015-05-02 DIAGNOSIS — F419 Anxiety disorder, unspecified: Secondary | ICD-10-CM | POA: Diagnosis not present

## 2015-05-02 DIAGNOSIS — K219 Gastro-esophageal reflux disease without esophagitis: Secondary | ICD-10-CM | POA: Diagnosis not present

## 2015-05-02 DIAGNOSIS — Z8701 Personal history of pneumonia (recurrent): Secondary | ICD-10-CM | POA: Diagnosis not present

## 2015-05-02 DIAGNOSIS — R079 Chest pain, unspecified: Secondary | ICD-10-CM | POA: Diagnosis not present

## 2015-05-02 LAB — BASIC METABOLIC PANEL
Anion gap: 9 (ref 5–15)
BUN: 18 mg/dL (ref 6–20)
CALCIUM: 9.1 mg/dL (ref 8.9–10.3)
CHLORIDE: 101 mmol/L (ref 101–111)
CO2: 27 mmol/L (ref 22–32)
CREATININE: 1 mg/dL (ref 0.61–1.24)
GFR calc non Af Amer: >60 mL/min (ref >60)
Glucose, Bld: 120 mg/dL — ABNORMAL HIGH (ref 65–99)
Potassium: 4.4 mmol/L (ref 3.5–5.1)
SODIUM: 137 mmol/L (ref 135–145)

## 2015-05-02 LAB — CBC
HCT: 43.2 % (ref 39.0–52.0)
Hemoglobin: 15.2 g/dL (ref 13.0–17.0)
MCH: 31.9 pg (ref 26.0–34.0)
MCHC: 35.2 g/dL (ref 30.0–36.0)
MCV: 90.6 fL (ref 78.0–100.0)
PLATELETS: 183 10*3/uL (ref 150–400)
RBC: 4.77 MIL/uL (ref 4.22–5.81)
RDW: 11.7 % (ref 11.5–15.5)
WBC: 5.7 10*3/uL (ref 4.0–10.5)

## 2015-05-02 LAB — I-STAT TROPONIN, ED: TROPONIN I, POC: 0 ng/mL (ref 0.00–0.08)

## 2015-05-02 MED ORDER — GI COCKTAIL ~~LOC~~
30.0000 mL | Freq: Once | ORAL | Status: AC
  Administered 2015-05-02: 30 mL via ORAL
  Filled 2015-05-02: qty 30

## 2015-05-02 NOTE — ED Provider Notes (Signed)
CSN: [id number]     Arrival date & time 05/02/15  1414 History   First MD Initiated Contact with Patient 05/02/15 1627     Chief Complaint  Patient presents with  . Chest Pain     (Consider location/radiation/quality/duration/timing/severity/associated sxs/prior Treatment) The history is provided by the patient.  [name] is a 64 y.o. male history of Barrett's esophagus, reflux, former smoker here presenting with chest pain. Patient states that he has intermittent chest pain over the last month. States that there is no particular pattern to it. It is not worse with exertion and not worse with food. He states that it usually lasts about 20 minutes and is substernal. Denies any shortness of breath. Came to the ER about a week ago and had 2 negative troponins, normal chest x-ray. He was scheduled to see cardiology next week. No history of CAD. He is supposed to see GI to get a repeat endoscopy for barrett's esophagus. He is currently pain free, last episode of pain was yesterday morning.    Past Medical History  Diagnosis Date  . Pneumonia 2009  . 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 Mitral valve prolapse     does not see cardiologist for. last stress test 2003  . Hypercalcemia   . Arthritis   . GERD (gastroesophageal reflux disease)   . Lipoma     left forearm (3)  . Dysrhythmia     ":Due to MVP"  . Bronchitis     history of  . Tumor cells, benign     lung, left side  . Hiatal hernia    Past Surgical History  Procedure Laterality Date  . Cholecystectomy  2002  . Mastoidectomy  1996  . Appendectomy  1984  . Cataract extraction      L and R eye  . Parathyroidectomy    . Nasal septum surgery      sinus surgery  . Total hip revision  06/14/2011    Procedure: TOTAL HIP REVISION;  Surgeon: Kerin Salen;  Location: [facility];  Service: Orthopedics;  Laterality: Left;  Left Acetabular  Hip Revision  . Joint replacement      Lt hip   Family History  Problem Relation  Age of Onset  . Cancer Father     lung  . Cancer Brother     prostate  . Heart Problems Mother   . Heart Problems Father    Social History  Substance Use Topics  . Smoking status: Former Smoker -- 1.50 packs/day for 24 years    Types: Cigarettes  . Smokeless tobacco: Former Systems developer     Comment: quit smoking in 1987  . Alcohol Use: 0.6 oz/week    1 Cans of beer per week    Review of Systems  Cardiovascular: Positive for chest pain.  All other systems reviewed and are negative.     Allergies  Ciprofloxacin hcl  Home Medications   Prior to Admission medications   Medication Sig Start Date End Date Taking? Authorizing Provider  buPROPion (WELLBUTRIN SR) 150 MG 12 hr tablet Take 150 mg by mouth 2 (two) times daily.    Yes Historical Provider, MD  Coenzyme Q10 (COQ-10) 200 MG CAPS Take 1 capsule by mouth daily.   Yes Historical Provider, MD  docusate sodium (COLACE) 100 MG capsule Take 200 mg by mouth daily.   Yes Historical Provider, MD  [place] 2 each by mouth daily.   Yes Historical  Provider, MD  FLUoxetine (PROZAC) 40 MG capsule Take 40 mg by mouth daily.     Yes Historical Provider, MD  methylphenidate (RITALIN) 10 MG tablet Take 10 mg by mouth 3 (three) times daily.     Yes Historical Provider, MD  Multiple Vitamin (MULTIVITAMIN WITH MINERALS) TABS tablet Take 1 tablet by mouth daily.   Yes Historical Provider, MD  omeprazole (PRILOSEC) 20 MG capsule Take 20 mg by mouth 2 (two) times daily before a meal.    Yes Historical Provider, MD  Probiotic Product (PROBIOTIC DAILY) CAPS Take 1 capsule by mouth daily.   Yes Historical Provider, MD  simvastatin (ZOCOR) 20 MG tablet Take 20 mg by mouth at bedtime.     Yes Historical Provider, MD  sucralfate (CARAFATE) 1 G tablet Take 1 g by mouth 4 (four) times daily - after meals and at bedtime. 04/25/15  Yes Historical Provider, MD  HYDROcodone-acetaminophen (NORCO/VICODIN) 5-325 MG per tablet Take 1 tablet by mouth  every 6 (six) hours as needed. Patient not taking: Reported on 05/02/2015 11/10/13   Jarrett Soho Muthersbaugh, PA-C   BP 119/76 mmHg  Pulse 85  Temp(Src) 99 F (37.2 C) (Oral)  Resp 18  SpO2 100% Physical Exam  Constitutional: He is oriented to person, place, and time.  Anxious   HENT:  Head: Normocephalic.  Mouth/Throat: Oropharynx is clear and moist.  Eyes: Conjunctivae are normal. Pupils are equal, round, and reactive to light.  Neck: Normal range of motion. Neck supple.  Cardiovascular: Normal rate, regular rhythm and normal heart sounds.   Pulmonary/Chest: Effort normal and breath sounds normal. No respiratory distress. He has no wheezes. He has no rales. He exhibits no tenderness.  Abdominal: Soft. Bowel sounds are normal. He exhibits no distension. There is no tenderness. There is no rebound.  Musculoskeletal: Normal range of motion. He exhibits no edema or tenderness.  Neurological: He is alert and oriented to person, place, and time. No cranial nerve deficit. Coordination normal.  Skin: Skin is warm and dry.  Psychiatric: He has a normal mood and affect. His behavior is normal. Judgment and thought content normal.  Nursing note and vitals reviewed.   ED Course  Procedures (including critical care time) Labs Review Labs Reviewed  BASIC METABOLIC PANEL - Abnormal; Notable for the following:    Glucose, Bld 120 (*)    All other components within normal limits  CBC  I-STAT TROPOININ, ED    Imaging Review Dg Chest 2 View  05/02/2015  CLINICAL DATA:  Chest pain. EXAM: CHEST  2 VIEW COMPARISON:  April 25, 2015. FINDINGS: The heart size and mediastinal contours are within normal limits. Both lungs are clear. No pneumothorax or pleural effusion is noted. The visualized skeletal structures are unremarkable. IMPRESSION: No active cardiopulmonary disease. Electronically Signed   By: Marijo Conception, M.D.   On: 05/02/2015 15:09   I have personally reviewed and evaluated these images  and lab results as part of my medical decision-making.   EKG Interpretation   Date/Time:  Friday May 02 2015 14:18:10 EDT Ventricular Rate:  82 PR Interval:  152 QRS Duration: 76 QT Interval:  378 QTC Calculation: 441 R Axis:   93 Text Interpretation:  Normal sinus rhythm Rightward axis Septal infarct ,  age undetermined Abnormal ECG No significant change since last tracing  Confirmed by YAO  MD, DAVID ([id number]) on 05/02/2015 5:51:31 PM      MDM   Final diagnoses:  None   [name] is  a 64 y.o. male here with chest pain over a month. Last episode was yesterday. Neg trop today, no EKG changes. No chest pain today. I doubt ACS. Has cardiology and GI follow up. Stable for dc.      Wandra Arthurs, MD 05/02/15 [phone]

## 2015-05-02 NOTE — ED Notes (Addendum)
Pt reports mid chest pain for over one month. Pt describes the pain as feeling like acid reflux. Pt went to dr last Friday and was then sent here to ED for eval, pt was dc home with changes in acid reflux/ulcer medication but reports no relief. Pain coming more frequently and now into left underarm.

## 2015-05-02 NOTE — Discharge Instructions (Signed)
Continue taking your current meds.   See GI and cardiology.   Return to ER if you have severe chest pain, shortness of breath, vomiting.

## 2015-05-08 DIAGNOSIS — F900 Attention-deficit hyperactivity disorder, predominantly inattentive type: Secondary | ICD-10-CM | POA: Diagnosis not present

## 2015-05-08 DIAGNOSIS — J449 Chronic obstructive pulmonary disease, unspecified: Secondary | ICD-10-CM | POA: Diagnosis not present

## 2015-05-08 DIAGNOSIS — E210 Primary hyperparathyroidism: Secondary | ICD-10-CM | POA: Diagnosis not present

## 2015-05-08 DIAGNOSIS — H269 Unspecified cataract: Secondary | ICD-10-CM | POA: Diagnosis not present

## 2015-05-08 DIAGNOSIS — F322 Major depressive disorder, single episode, severe without psychotic features: Secondary | ICD-10-CM | POA: Diagnosis not present

## 2015-05-08 DIAGNOSIS — Z23 Encounter for immunization: Secondary | ICD-10-CM | POA: Diagnosis not present

## 2015-05-08 DIAGNOSIS — C61 Malignant neoplasm of prostate: Secondary | ICD-10-CM | POA: Diagnosis not present

## 2015-05-08 DIAGNOSIS — Z0000 Encounter for general adult medical examination without abnormal findings: Secondary | ICD-10-CM | POA: Diagnosis not present

## 2015-05-08 DIAGNOSIS — H7290 Unspecified perforation of tympanic membrane, unspecified ear: Secondary | ICD-10-CM | POA: Diagnosis not present

## 2015-05-08 DIAGNOSIS — M792 Neuralgia and neuritis, unspecified: Secondary | ICD-10-CM | POA: Diagnosis not present

## 2015-05-08 DIAGNOSIS — Z79899 Other long term (current) drug therapy: Secondary | ICD-10-CM | POA: Diagnosis not present

## 2015-05-08 DIAGNOSIS — E785 Hyperlipidemia, unspecified: Secondary | ICD-10-CM | POA: Diagnosis not present

## 2015-05-10 ENCOUNTER — Other Ambulatory Visit: Payer: Self-pay | Admitting: Family Medicine

## 2015-05-10 DIAGNOSIS — Z136 Encounter for screening for cardiovascular disorders: Secondary | ICD-10-CM

## 2015-05-12 ENCOUNTER — Ambulatory Visit
Admission: RE | Admit: 2015-05-12 | Discharge: 2015-05-12 | Disposition: A | Discharge status: Home / Self Care | Payer: Medicare Other | Source: Ambulatory Visit | Attending: Family Medicine | Admitting: Family Medicine

## 2015-05-12 DIAGNOSIS — Z136 Encounter for screening for cardiovascular disorders: Secondary | ICD-10-CM | POA: Diagnosis not present

## 2015-05-13 ENCOUNTER — Encounter: Payer: Self-pay | Admitting: Cardiovascular Disease

## 2015-05-13 ENCOUNTER — Ambulatory Visit (INDEPENDENT_AMBULATORY_CARE_PROVIDER_SITE_OTHER): Payer: Medicare Other | Admitting: Cardiovascular Disease

## 2015-05-13 VITALS — BP 100/80 | HR 70 | Ht 72.0 in | Wt 163.2 lb

## 2015-05-13 DIAGNOSIS — R079 Chest pain, unspecified: Secondary | ICD-10-CM | POA: Insufficient documentation

## 2015-05-13 DIAGNOSIS — R0789 Other chest pain: Secondary | ICD-10-CM | POA: Diagnosis not present

## 2015-05-13 NOTE — Patient Instructions (Signed)
Medication Instructions:  Your physician recommends that you continue on your current medications as directed. Please refer to the Current Medication list given to you today.   Labwork: None Ordered   Testing/Procedures: Your physician has requested that you have an exercise tolerance test. For further information please visit [email or web address]. Please also follow instruction sheet, as given.    Follow-Up: Your physician recommends that you schedule a follow-up appointment in: as needed with Dr. Acie Fredrickson.    If you need a refill on your cardiac medications before your next appointment, please call your pharmacy.   Thank you for choosing [facility]! Christen Bame, RN [phone]

## 2015-05-13 NOTE — Progress Notes (Signed)
Cardiology Office Note   Date:  05/13/2015   ID:  [name], DOB 1950-12-02, MRN [id number]  PCP:  Lilian Coma, MD  Cardiologist:   Thayer Headings, MD   Chief Complaint  Patient presents with  . Chest Pain   Problem List 1. Chest pain 2. Hiatal hernia 3. GERD 4. Barretts esophagitis    History of Present Illness: [name] is a 64 y.o. male who presents for evaluation of his CP Would wake him up at night.  Not associated with eating or drinking. Various times of the day  Does not exercise so he does not know if it is exacerbated with exertion .  Would become very fatigued with household chores but not cause chest pain  for the past year.  Gets very tired vaccuming 1/2 of the appt.  Walks the dog occasionally . Used to walk 1 1/2 miles a day - stopped walking this in may , 2015  Pains will ast 25-45 minutes.  sulcrafate has helped the pains quite a bit.     Past Medical History  Diagnosis Date  . Pneumonia 2009  . 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 Mitral valve prolapse     does not see cardiologist for. last stress test 2003  . Hypercalcemia   . Arthritis   . GERD (gastroesophageal reflux disease)   . Lipoma     left forearm (3)  . Dysrhythmia     ":Due to MVP"  . Bronchitis     history of  . Tumor cells, benign     lung, left side  . Hiatal hernia     Past Surgical History  Procedure Laterality Date  . Cholecystectomy  2002  . Mastoidectomy  1996  . Appendectomy  1984  . Cataract extraction      L and R eye  . Parathyroidectomy    . Nasal septum surgery      sinus surgery  . Total hip revision  06/14/2011    Procedure: TOTAL HIP REVISION;  Surgeon: Kerin Salen;  Location: [facility];  Service: Orthopedics;  Laterality: Left;  Left Acetabular  Hip Revision  . Joint replacement      Lt hip     Current Outpatient Prescriptions  Medication Sig Dispense Refill  . buPROPion (WELLBUTRIN SR) 150 MG 12 hr tablet Take 150  mg by mouth 2 (two) times daily.     . Coenzyme Q10 (COQ-10) 200 MG CAPS Take 1 capsule by mouth daily.    Marland Kitchen docusate sodium (COLACE) 100 MG capsule Take 200 mg by mouth daily.    Marland Kitchen FIBER SELECT GUMMIES CHEW Chew 2 each by mouth daily.    Marland Kitchen FLUoxetine (PROZAC) 40 MG capsule Take 40 mg by mouth daily.      . methylphenidate (RITALIN) 10 MG tablet Take 10 mg by mouth 3 (three) times daily.      . Multiple Vitamin (MULTIVITAMIN WITH MINERALS) TABS tablet Take 1 tablet by mouth daily.    Marland Kitchen omeprazole (PRILOSEC) 20 MG capsule Take 20 mg by mouth 2 (two) times daily before a meal.     . Probiotic Product (PROBIOTIC DAILY) CAPS Take 1 capsule by mouth daily.    . simvastatin (ZOCOR) 20 MG tablet Take 20 mg by mouth at bedtime.      . sucralfate (CARAFATE) 1 G tablet Take 1 g by mouth 4 (four) times daily - after meals and at bedtime.    Marland Kitchen  HYDROcodone-acetaminophen (NORCO/VICODIN) 5-325 MG per tablet Take 1 tablet by mouth every 6 (six) hours as needed. (Patient not taking: Reported on 05/02/2015) 6 tablet 0   No current facility-administered medications for this visit.    Allergies:   Ciprofloxacin hcl    Social History:  The patient  reports that he has quit smoking. His smoking use included Cigarettes. He has a 36 pack-year smoking history. He has quit using smokeless tobacco. He reports that he drinks about 0.6 oz of alcohol per week. He reports that he uses illicit drugs (Marijuana).   Family History:  The patient's family history includes Cancer in his brother and father; Heart Problems in his father and mother.    ROS:  Please see the history of present illness.    Review of Systems: Constitutional:  denies fever, chills, diaphoresis, appetite change and fatigue.  HEENT: denies photophobia, eye pain, redness, hearing loss, ear pain, congestion, sore throat, rhinorrhea, sneezing, neck pain, neck stiffness and tinnitus.  Respiratory: denies SOB, DOE, cough, chest tightness, and wheezing.    Cardiovascular: denies chest pain, palpitations and leg swelling.  Gastrointestinal: denies nausea, vomiting, abdominal pain, diarrhea, constipation, blood in stool.  Genitourinary: denies dysuria, urgency, frequency, hematuria, flank pain and difficulty urinating.  Musculoskeletal: denies  myalgias, back pain, joint swelling, arthralgias and gait problem.   Skin: denies pallor, rash and wound.  Neurological: denies dizziness, seizures, syncope, weakness, light-headedness, numbness and headaches.   Hematological: denies adenopathy, easy bruising, personal or family bleeding history.  Psychiatric/ Behavioral: denies suicidal ideation, mood changes, confusion, nervousness, sleep disturbance and agitation.       All other systems are reviewed and negative.    PHYSICAL EXAM: VS:  BP 100/80 mmHg  Pulse 70  Ht 6' (1.829 m)  Wt 163 lb 3.2 oz (74.027 kg)  BMI 22.13 kg/m2  SpO2 99% , BMI Body mass index is 22.13 kg/(m^2). GEN: Well nourished, well developed, in no acute distress HEENT: normal Neck: no JVD, carotid bruits, or masses Cardiac: RRR; no murmurs, rubs, or gallops,no edema  Respiratory:  clear to auscultation bilaterally, normal work of breathing GI: soft, nontender, nondistended, + BS MS: no deformity or atrophy Skin: warm and dry, no rash Neuro:  Strength and sensation are intact Psych: normal   EKG:  EKG is not ordered today.    Recent Labs: 04/25/2015: TSH 3.870 05/02/2015: BUN 18; Creatinine, Ser 1.00; Hemoglobin 15.2; Platelets 183; Potassium 4.4; Sodium 137    Lipid Panel No results found for: CHOL, TRIG, HDL, CHOLHDL, VLDL, LDLCALC, LDLDIRECT    Wt Readings from Last 3 Encounters:  05/13/15 163 lb 3.2 oz (74.027 kg)  04/25/15 167 lb (75.751 kg)  01/08/14 167 lb (75.751 kg)      Other studies Reviewed: Additional studies/ records that were reviewed today include: . Review of the above records demonstrates:    ASSESSMENT AND PLAN:  1.  Chest pain:  His chest pain seems to have responded quite well to be Carafate.  I think his  reasonable to do a regular treadmill test because of his extreme fatigue with exertion. Please that his chest pains have largely resolved with treatment of his gastroesophageal reflux disease. We'll see him back on an as-needed basis.   Current medicines are reviewed at length with the patient today.  The patient does not have concerns regarding medicines.  The following changes have been made:  no change  Labs/ tests ordered today include:  No orders of the defined types were placed  in this encounter.     Disposition:   FU with me as needed.       Nahser, Wonda Cheng, MD  05/13/2015 2:10 PM    [facility] [address], [place], [place]  [address] Phone: [phone]; Fax: [phone]   [facility]  [address] [place],   [address] [phone]   Fax [phone]

## 2015-05-23 ENCOUNTER — Ambulatory Visit (INDEPENDENT_AMBULATORY_CARE_PROVIDER_SITE_OTHER): Payer: Medicare Other

## 2015-05-23 ENCOUNTER — Encounter: Payer: Medicare Other | Admitting: Cardiology

## 2015-05-23 DIAGNOSIS — R0789 Other chest pain: Secondary | ICD-10-CM | POA: Diagnosis not present

## 2015-05-23 LAB — EXERCISE TOLERANCE TEST
CHL CUP MPHR: 155 {beats}/min
CHL RATE OF PERCEIVED EXERTION: 17
CSEPHR: 92 %
CSEPPHR: 142 {beats}/min
Estimated workload: 6.9 METS
Exercise duration (min): 5 min
Exercise duration (sec): 0 s
Rest HR: 88 {beats}/min

## 2015-05-26 ENCOUNTER — Encounter (HOSPITAL_COMMUNITY): Payer: Self-pay | Admitting: *Deleted

## 2015-05-26 ENCOUNTER — Emergency Department (HOSPITAL_COMMUNITY)
Admission: EM | Admit: 2015-05-26 | Discharge: 2015-05-26 | Disposition: A | Discharge status: Home / Self Care | Payer: Medicare Other | Attending: Emergency Medicine | Admitting: Emergency Medicine

## 2015-05-26 DIAGNOSIS — Z8709 Personal history of other diseases of the respiratory system: Secondary | ICD-10-CM | POA: Insufficient documentation

## 2015-05-26 DIAGNOSIS — K219 Gastro-esophageal reflux disease without esophagitis: Secondary | ICD-10-CM | POA: Insufficient documentation

## 2015-05-26 DIAGNOSIS — Z8701 Personal history of pneumonia (recurrent): Secondary | ICD-10-CM | POA: Insufficient documentation

## 2015-05-26 DIAGNOSIS — M79661 Pain in right lower leg: Secondary | ICD-10-CM | POA: Insufficient documentation

## 2015-05-26 DIAGNOSIS — Z8679 Personal history of other diseases of the circulatory system: Secondary | ICD-10-CM | POA: Insufficient documentation

## 2015-05-26 DIAGNOSIS — M7989 Other specified soft tissue disorders: Secondary | ICD-10-CM | POA: Insufficient documentation

## 2015-05-26 DIAGNOSIS — Z8639 Personal history of other endocrine, nutritional and metabolic disease: Secondary | ICD-10-CM | POA: Insufficient documentation

## 2015-05-26 DIAGNOSIS — M1990 Unspecified osteoarthritis, unspecified site: Secondary | ICD-10-CM | POA: Insufficient documentation

## 2015-05-26 DIAGNOSIS — Z79899 Other long term (current) drug therapy: Secondary | ICD-10-CM | POA: Diagnosis not present

## 2015-05-26 DIAGNOSIS — Z87891 Personal history of nicotine dependence: Secondary | ICD-10-CM | POA: Insufficient documentation

## 2015-05-26 DIAGNOSIS — Z86018 Personal history of other benign neoplasm: Secondary | ICD-10-CM | POA: Diagnosis not present

## 2015-05-26 DIAGNOSIS — Z96649 Presence of unspecified artificial hip joint: Secondary | ICD-10-CM | POA: Insufficient documentation

## 2015-05-26 LAB — I-STAT CHEM 8, ED
BUN: 18 mg/dL (ref 6–20)
CHLORIDE: 99 mmol/L — AB (ref 101–111)
CREATININE: 0.9 mg/dL (ref 0.61–1.24)
Calcium, Ion: 1.12 mmol/L — ABNORMAL LOW (ref 1.13–1.30)
GLUCOSE: 92 mg/dL (ref 65–99)
HEMATOCRIT: 48 % (ref 39.0–52.0)
HEMOGLOBIN: 16.3 g/dL (ref 13.0–17.0)
POTASSIUM: 3.9 mmol/L (ref 3.5–5.1)
Sodium: 142 mmol/L (ref 135–145)
TCO2: 28 mmol/L (ref 0–100)

## 2015-05-26 MED ORDER — ENOXAPARIN SODIUM 80 MG/0.8ML ~~LOC~~ SOLN
1.0000 mg/kg | Freq: Once | SUBCUTANEOUS | Status: AC
  Administered 2015-05-26: 75 mg via SUBCUTANEOUS
  Filled 2015-05-26: qty 0.8

## 2015-05-26 NOTE — Discharge Instructions (Signed)
Possible Deep Vein Thrombosis A deep vein thrombosis (DVT) is a blood clot (thrombus) that usually occurs in a deep, larger vein of the lower leg or the pelvis, or in an upper extremity such as the arm. These are dangerous and can lead to serious and even life-threatening complications if the clot travels to the lungs. A DVT can damage the valves in your leg veins so that instead of flowing upward, the blood pools in the lower leg. This is called post-thrombotic syndrome, and it can result in pain, swelling, discoloration, and sores on the leg. CAUSES A DVT is caused by the formation of a blood clot in your leg, pelvis, or arm. Usually, several things contribute to the formation of blood clots. A clot may develop when:  Your blood flow slows down.  Your vein becomes damaged in some way.  You have a condition that makes your blood clot more easily. RISK FACTORS A DVT is more likely to develop in:  People who are older, especially over 22 years of age.  People who are overweight (obese).  People who sit or lie still for a long time, such as during long-distance travel (over 4 hours), bed rest, hospitalization, or during recovery from certain medical conditions like a stroke.  People who do not engage in much physical activity (sedentary lifestyle).  People who have chronic breathing disorders.  People who have a personal or family history of blood clots or blood clotting disease.  People who have peripheral vascular disease (PVD), diabetes, or some types of cancer.  People who have heart disease, especially if the person had a recent heart attack or has congestive heart failure.  People who have neurological diseases that affect the legs (leg paresis).  People who have had a traumatic injury, such as breaking a hip or leg.  People who have recently had major or lengthy surgery, especially on the hip, knee, or abdomen.  People who have had a central line placed inside a large  vein.  People who take medicines that contain the hormone estrogen. These include birth control pills and hormone replacement therapy.  Pregnancy or during childbirth or the postpartum period.  Long plane flights (over 8 hours). SIGNS AND SYMPTOMS Symptoms of a DVT can include:   Swelling of your leg or arm, especially if one side is much worse.  Warmth and redness of your leg or arm, especially if one side is much worse.  Pain in your arm or leg. If the clot is in your leg, symptoms may be more noticeable or worse when you stand or walk.  A feeling of pins and needles, if the clot is in the arm. The symptoms of a DVT that has traveled to the lungs (pulmonary embolism, PE) usually start suddenly and include:  Shortness of breath while active or at rest.  Coughing or coughing up blood or blood-tinged mucus.  Chest pain that is often worse with deep breaths.  Rapid or irregular heartbeat.  Feeling light-headed or dizzy.  Fainting.  Feeling anxious.  Sweating. There may also be pain and swelling in a leg if that is where the blood clot started. These symptoms may represent a serious problem that is an emergency. Do not wait to see if the symptoms will go away. Get medical help right away. Call your local emergency services (911 in the [place] Do not drive yourself to the hospital. DIAGNOSIS Your health care provider will take a medical history and perform a physical exam. You may  also have other tests, including:  Blood tests to assess the clotting properties of your blood.  Imaging tests, such as CT, ultrasound, MRI, X-ray, and other tests to see if you have clots anywhere in your body. TREATMENT After a DVT is identified, it can be treated. The type of treatment that you receive depends on many factors, such as the cause of your DVT, your risk for bleeding or developing more clots, and other medical conditions that you have. Sometimes, a combination of treatments is  necessary. Treatment options may be combined and include:  Monitoring the blood clot with ultrasound.  Taking medicines by mouth, such as newer blood thinners (anticoagulants), thrombolytics, or warfarin.  Taking anticoagulant medicine by injection or through an IV tube.  Wearing compression stockings or using different types ofdevices.  Surgery (rare) to remove the blood clot or to place a filter in your abdomen to stop the blood clot from traveling to your lungs. Treatments for a DVT are often divided into immediate treatment and long-term treatment (up to 3 months after DVT). You can work with your health care provider to choose the treatment program that is best for you. HOME CARE INSTRUCTIONS If you are taking a newer oral anticoagulant:  Take the medicine every single day at the same time each day.  Understand what foods and drugs interact with this medicine.  Understand that there are no regular blood tests required when using this medicine.  Understand the side effects of this medicine, including excessive bruising or bleeding. Ask your health care provider or pharmacist about other possible side effects. If you are taking warfarin:  Understand how to take warfarin and know which foods can affect how warfarin works in Veterinary surgeon.  Understand that it is dangerous to take too much or too little warfarin. Too much warfarin increases the risk of bleeding. Too little warfarin continues to allow the risk for blood clots.  Follow your PT and INR blood testing schedule. The PT and INR results allow your health care provider to adjust your dose of warfarin. It is very important that you have your PT and INR tested as often as told by your health care provider.  Avoid major changes in your diet, or tell your health care provider before you change your diet. Arrange a visit with a registered dietitian to answer your questions. Many foods, especially foods that are high in vitamin K, can  interfere with warfarin and affect the PT and INR results. Eat a consistent amount of foods that are high in vitamin K, such as:  Spinach, kale, broccoli, cabbage, collard greens, turnip greens, Brussels sprouts, peas, cauliflower, seaweed, and parsley.  Beef liver and pork liver.  Green tea.  Soybean oil.  Tell your health care provider about any and all medicines, vitamins, and supplements that you take, including aspirin and other over-the-counter anti-inflammatory medicines. Be especially cautious with aspirin and anti-inflammatory medicines. Do not take those before you ask your health care provider if it is safe to do so. This is important because many medicines can interfere with warfarin and affect the PT and INR results.  Do not start or stop taking any over-the-counter or prescription medicine unless your health care provider or pharmacist tells you to do so. If you take warfarin, you will also need to do these things:  Hold pressure over cuts for longer than usual.  Tell your dentist and other health care providers that you are taking warfarin before you have any procedures in  which bleeding may occur.  Avoid alcohol or drink very small amounts. Tell your health care provider if you change your alcohol intake.  Do not use tobacco products, including cigarettes, chewing tobacco, and e-cigarettes. If you need help quitting, ask your health care provider.  Avoid contact sports. General Instructions  Take over-the-counter and prescription medicines only as told by your health care provider. Anticoagulant medicines can have side effects, including easy bruising and difficulty stopping bleeding. If you are prescribed an anticoagulant, you will also need to do these things:  Hold pressure over cuts for longer than usual.  Tell your dentist and other health care providers that you are taking anticoagulants before you have any procedures in which bleeding may occur.  Avoid contact  sports.  Wear a medical alert bracelet or carry a medical alert card that says you have had a PE.  Ask your health care provider how soon you can go back to your normal activities. Stay active to prevent new blood clots from forming.  Make sure to exercise while traveling or when you have been sitting or standing for a long period of time. It is very important to exercise. Exercise your legs by walking or by tightening and relaxing your leg muscles often. Take frequent walks.  Wear compression stockings as told by your health care provider to help prevent more blood clots from forming.  Do not use tobacco products, including cigarettes, chewing tobacco, and e-cigarettes. If you need help quitting, ask your health care provider.  Keep all follow-up appointments with your health care provider. This is important. PREVENTION Take these actions to decrease your risk of developing another DVT:  Exercise regularly. For at least 30 minutes every day, engage in:  Activity that involves moving your arms and legs.  Activity that encourages good blood flow through your body by increasing your heart rate.  Exercise your arms and legs every hour during long-distance travel (over 4 hours). Drink plenty of water and avoid drinking alcohol while traveling.  Avoid sitting or lying in bed for long periods of time without moving your legs.  Maintain a weight that is appropriate for your height. Ask your health care provider what weight is healthy for you.  If you are a woman who is over 44 years of age, avoid unnecessary use of medicines that contain estrogen. These include birth control pills.  Do not smoke, especially if you take estrogen medicines. If you need help quitting, ask your health care provider. If you are hospitalized, prevention measures may include:  Early walking after surgery, as soon as your health care provider says that it is safe.  Receiving anticoagulants to prevent blood  clots.If you cannot take anticoagulants, other options may be available, such as wearing compression stockings or using different types of devices. SEEK IMMEDIATE MEDICAL CARE IF:  You have new or increased pain, swelling, or redness in an arm or leg.  You have numbness or tingling in an arm or leg.  You have shortness of breath while active or at rest.  You have chest pain.  You have a rapid or irregular heartbeat.  You feel light-headed or dizzy.  You cough up blood.  You notice blood in your vomit, bowel movement, or urine. These symptoms may represent a serious problem that is an emergency. Do not wait to see if the symptoms will go away. Get medical help right away. Call your local emergency services (911 in the [place] Do not drive yourself to the hospital.  This information is not intended to replace advice given to you by your health care provider. Make sure you discuss any questions you have with your health care provider.   Document Released: 06/21/2005 Document Revised: 03/12/2015 Document Reviewed: 10/16/2014 Elsevier Interactive Patient Education 2016 [employer] Enoxaparin injection What is this medicine? ENOXAPARIN (ee nox a PA rin) is used after knee, hip, or abdominal surgeries to prevent blood clotting. It is also used to treat existing blood clots in the lungs or in the veins. This medicine may be used for other purposes; ask your health care provider or pharmacist if you have questions. What should I tell my health care provider before I take this medicine? They need to know if you have any of these conditions: -bleeding disorders, hemorrhage, or hemophilia -infection of the heart or heart valves -kidney or liver disease -previous stroke -prosthetic heart valve -recent surgery or delivery of a baby -ulcer in the stomach or intestine, diverticulitis, or other bowel disease -an unusual or allergic reaction to enoxaparin, heparin, pork or pork products, other  medicines, foods, dyes, or preservatives -pregnant or trying to get pregnant -breast-feeding How should I use this medicine? This medicine is for injection under the skin. It is usually given by a health-care professional. You or a family member may be trained on how to give the injections. If you are to give yourself injections, make sure you understand how to use the syringe, measure the dose if necessary, and give the injection. To avoid bruising, do not rub the site where this medicine has been injected. Do not take your medicine more often than directed. Do not stop taking except on the advice of your doctor or health care professional. Make sure you receive a puncture-resistant container to dispose of the needles and syringes once you have finished with them. Do not reuse these items. Return the container to your doctor or health care professional for proper disposal. Talk to your pediatrician regarding the use of this medicine in children. Special care may be needed. Overdosage: If you think you have taken too much of this medicine contact a poison control center or emergency room at once. NOTE: This medicine is only for you. Do not share this medicine with others. What if I miss a dose? If you miss a dose, take it as soon as you can. If it is almost time for your next dose, take only that dose. Do not take double or extra doses. What may interact with this medicine? -aspirin and aspirin-like medicines -certain medicines that treat or prevent blood clots -dipyridamole -NSAIDs, medicines for pain and inflammation, like ibuprofen or naproxen This list may not describe all possible interactions. Give your health care provider a list of all the medicines, herbs, non-prescription drugs, or dietary supplements you use. Also tell them if you smoke, drink alcohol, or use illegal drugs. Some items may interact with your medicine. What should I watch for while using this medicine? Visit your doctor or  health care professional for regular checks on your progress. Your condition will be monitored carefully while you are receiving this medicine. Notify your doctor or health care professional and seek emergency treatment if you develop breathing problems; changes in vision; chest pain; severe, sudden headache; pain, swelling, warmth in the leg; trouble speaking; sudden numbness or weakness of the face, arm, or leg. These can be signs that your condition has gotten worse. If you are going to have surgery, tell your doctor or health care professional that  you are taking this medicine. Do not stop taking this medicine without first talking to your doctor. Be sure to refill your prescription before you run out of medicine. Avoid sports and activities that might cause injury while you are using this medicine. Severe falls or injuries can cause unseen bleeding. Be careful when using sharp tools or knives. Consider using an Copy. Take special care brushing or flossing your teeth. Report any injuries, bruising, or red spots on the skin to your doctor or health care professional. What side effects may I notice from receiving this medicine? Side effects that you should report to your doctor or health care professional as soon as possible: -allergic reactions like skin rash, itching or hives, swelling of the face, lips, or tongue -feeling faint or lightheaded, falls -signs and symptoms of bleeding such as bloody or black, tarry stools; red or dark-brown urine; spitting up blood or brown material that looks like coffee grounds; red spots on the skin; unusual bruising or bleeding from the eye, gums, or nose Side effects that usually do not require medical attention (report to your doctor or health care professional if they continue or are bothersome): -pain, redness, or irritation at site where injected This list may not describe all possible side effects. Call your doctor for medical advice about side  effects. You may report side effects to FDA at 1-800-FDA-1088. Where should I keep my medicine? Keep out of the reach of children. Store at room temperature between 15 and 30 degrees C (59 and 86 degrees F). Do not freeze. If your injections have been specially prepared, you may need to store them in the refrigerator. Ask your pharmacist. Throw away any unused medicine after the expiration date. NOTE: This sheet is a summary. It may not cover all possible information. If you have questions about this medicine, talk to your doctor, pharmacist, or health care provider.    2016, Elsevier/[employer]. (2013-10-23 16:06:21) Vascular Ultrasound An ultrasound, also called sonography or ultrasonography, uses harmless sound waves to take pictures of the inside of your body. The pictures are taken with a device called a transducer that is held up against your body. The continually changing pictures can be recorded on videotape or film. A vascular ultrasound is a painless test to see if you have blood flow problems or clots in your blood vessels. It may be done to look at blood vessels almost anywhere in the body. There are several types of ultrasounds that can be done to look at the blood vessels. They include:  Continuous wave Doppler ultrasound. This type of ultrasound uses the change in pitch of sound waves to provide information about blood flow through a blood vessel. During the test, a health care provider listens to the sounds produced by the transducer.  Duplex ultrasound. This type of ultrasound uses standard ultrasound methods to produce a picture of a blood vessel and surrounding organs. In addition, a computer provides information about the speed and direction of blood flow through the blood vessel. With this type of ultrasound it is possible to see the structures inside the body and to evaluate blood flow within those structures at the same time.  Color Doppler ultrasound. This type of  ultrasound uses standard ultrasound methods to produce a picture of a blood vessel. In addition, a computer converts the Doppler sounds into colors that are overlaid on the picture of the blood vessel. These colors represent the speed and direction of blood flow through the vessel.  Power Doppler ultrasound. This type of ultrasound is up to five times more sensitive than color Doppler ultrasound. Power Doppler ultrasound can also get pictures that are difficult or impossible to get using standard color Doppler ultrasound. Power Doppler ultrasound is most commonly used to evaluate blood flow through vessels within organs, such as the liver or kidneys.  Transcranial Doppler ultrasound. This type of ultrasound looks at blood flow in blood vessels throughout the brain. It can reveal the presence of narrow arteries, clots blocking the vessels, or malformed blood vessels. RISKS AND COMPLICATIONS There are no known risks or complications of having an ultrasound. BEFORE THE PROCEDURE  If the ultrasound scan involves your upper abdomen, you may be directed not to eat, smoke, or chew gum the morning of your exam. Follow your health care provider's instructions.  During the test, a gel will be applied to your skin. Wear clothing that is easily washable in case the gel gets on your clothes. PROCEDURE  A gel will be applied to your skin. It may feel cool.  The transducer will be placed on the area to be examined.  Pictures will be taken. They will be displayed on one or more monitors that look like small television screens. AFTER THE PROCEDURE  You can safely drive home and return to regular activities immediately after your exam.  Keep follow-up visits as directed by your health care provider.  Ask when your test results will be ready. It is your responsibility to get your test results.   This information is not intended to replace advice given to you by your health care provider. Make sure you  discuss any questions you have with your health care provider.   Document Released: 07/02/2004 Document Revised: 07/12/2014 Document Reviewed: 09/13/2013 Elsevier Interactive Patient Education [employer].

## 2015-05-26 NOTE — ED Notes (Signed)
Pt states that this morning he started experiencing right leg pain. States that it is swollen and tender to touch. States he saw his PCP who instructed him to come to the ED for an [place].

## 2015-05-26 NOTE — ED Provider Notes (Signed)
CSN: [id number]     Arrival date & time 05/26/15  1927 History  By signing my name below, I, Hilda Lias, attest that this documentation has been prepared under the direction and in the presence of Will Kolina Kube, PA-C.  Electronically Signed: Hilda Lias, ED Scribe. 05/26/2015. 9:12 PM.    Chief Complaint  Patient presents with  . Leg Pain      Patient is a 64 y.o. male presenting with leg pain. The history is provided by the patient. No language interpreter was used.  Leg Pain Associated symptoms: no fever    HPI Comments: [name] is a 64 y.o. male who presents to the Emergency Department complaining of constant, worsening 1/10 right lower leg pain and swelling that has been present for 15 hours. Pt states that he woke up early this morning with pain in his lower leg, and reports it has worsened in pain throughout the day. Pt states he cannot currently walk on his right leg. Pt states that he had right leg swelling earlier in the day, but since has improved because he has not walked on it his leg. Pt states he had a cardiac stress test last Friday, which was unremarkable. Pt denies a hx of blood clots in the lungs, DVT, PE. Pt denies chest pain, SOB. He denies recent long travel or recent surgeries. He denies coughing or hemoptysis. He denies personal close family history of blood clotting disorders such as factor 5 Leiden, protein C or S deficiency.  Past Medical History  Diagnosis Date  . Pneumonia 2009  . 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 Mitral valve prolapse     does not see cardiologist for. last stress test 2003  . Hypercalcemia   . Arthritis   . GERD (gastroesophageal reflux disease)   . Lipoma     left forearm (3)  . Dysrhythmia     ":Due to MVP"  . Bronchitis     history of  . Tumor cells, benign     lung, left side  . Hiatal hernia    Past Surgical History  Procedure Laterality Date  . Cholecystectomy  2002  . Mastoidectomy  1996  .  Appendectomy  1984  . Cataract extraction      L and R eye  . Parathyroidectomy    . Nasal septum surgery      sinus surgery  . Total hip revision  06/14/2011    Procedure: TOTAL HIP REVISION;  Surgeon: Kerin Salen;  Location: [facility];  Service: Orthopedics;  Laterality: Left;  Left Acetabular  Hip Revision  . Joint replacement      Lt hip   Family History  Problem Relation Age of Onset  . Cancer Father     lung  . Cancer Brother     prostate  . Heart Problems Mother   . Heart Problems Father    Social History  Substance Use Topics  . Smoking status: Former Smoker -- 1.50 packs/day for 24 years    Types: Cigarettes  . Smokeless tobacco: Former Systems developer     Comment: quit smoking in 1987  . Alcohol Use: 0.6 oz/week    1 Cans of beer per week    Review of Systems  Constitutional: Negative for fever.  Respiratory: Negative for cough, shortness of breath and wheezing.   Cardiovascular: Positive for leg swelling. Negative for chest pain and palpitations.  Musculoskeletal: Positive for myalgias.  Skin: Negative for rash and wound.  Neurological: Negative for weakness and numbness.  All other systems reviewed and are negative.     Allergies  Ciprofloxacin hcl  Home Medications   Prior to Admission medications   Medication Sig Start Date End Date Taking? Authorizing Provider  buPROPion (WELLBUTRIN SR) 150 MG 12 hr tablet Take 150 mg by mouth 2 (two) times daily.    Yes Historical Provider, MD  Coenzyme Q10 (COQ-10) 200 MG CAPS Take 1 capsule by mouth daily.   Yes Historical Provider, MD  docusate sodium (COLACE) 100 MG capsule Take 200 mg by mouth daily.   Yes Historical Provider, MD  [place] 2 each by mouth daily.   Yes Historical Provider, MD  FLUoxetine (PROZAC) 40 MG capsule Take 40 mg by mouth daily.     Yes Historical Provider, MD  HYDROcodone-acetaminophen (NORCO/VICODIN) 5-325 MG per tablet Take 1 tablet by mouth every 6 (six) hours as needed.  11/10/13  Yes Hannah Muthersbaugh, PA-C  methylphenidate (RITALIN) 10 MG tablet Take 10 mg by mouth 3 (three) times daily.     Yes Historical Provider, MD  Multiple Vitamin (MULTIVITAMIN WITH MINERALS) TABS tablet Take 1 tablet by mouth daily.   Yes Historical Provider, MD  omeprazole (PRILOSEC) 20 MG capsule Take 20 mg by mouth 2 (two) times daily before a meal.    Yes Historical Provider, MD  Probiotic Product (PROBIOTIC DAILY) CAPS Take 1 capsule by mouth daily.   Yes Historical Provider, MD  simvastatin (ZOCOR) 20 MG tablet Take 20 mg by mouth at bedtime.     Yes Historical Provider, MD  sucralfate (CARAFATE) 1 G tablet Take 1 g by mouth 4 (four) times daily - after meals and at bedtime. 04/25/15  Yes Historical Provider, MD   BP 118/74 mmHg  Pulse 81  Temp(Src) 98.1 F (36.7 C) (Oral)  Resp 16  Ht 5' 11.5" (1.816 m)  Wt 73.624 kg  BMI 22.32 kg/m2  SpO2 98% Physical Exam  Constitutional: He appears well-developed and well-nourished. No distress.  Nontoxic appearing.  HENT:  Head: Normocephalic and atraumatic.  Eyes: Conjunctivae are normal. Pupils are equal, round, and reactive to light. Right eye exhibits no discharge. Left eye exhibits no discharge.  Neck: Neck supple.  Cardiovascular: Normal rate, regular rhythm, normal heart sounds and intact distal pulses.  Exam reveals no gallop and no friction rub.   No murmur heard. Bilateral radial, posterior tibialis and dorsalis pedis pulses are intact.  Patient has good capillary refill of his right distal toes.  Pulmonary/Chest: Effort normal and breath sounds normal. No respiratory distress. He has no wheezes. He has no rales.  Abdominal: Soft. There is no tenderness.  Musculoskeletal: Normal range of motion. He exhibits tenderness. He exhibits no edema.  Right calf tenderness to palpation. No lower extremity edema noted bilaterally. Patient has good strength and sensation to his bilateral lower extremities. No lower extremity erythema,  edema, deformity, ecchymosis.  Lymphadenopathy:    He has no cervical adenopathy.  Neurological: He is alert. Coordination normal.  Sensation is intact to his bilateral lower extremities.  Skin: Skin is warm and dry. No rash noted. He is not diaphoretic. No erythema. No pallor.  Psychiatric: He has a normal mood and affect. His behavior is normal.  Nursing note and vitals reviewed.   ED Course  Procedures (including critical care time)  DIAGNOSTIC STUDIES: Oxygen Saturation is 98% on room air, normal by my interpretation.    COORDINATION OF CARE: 9:11 PM Discussed treatment plan  with pt at bedside and pt agreed to plan.   Labs Review Labs Reviewed  I-STAT CHEM 8, ED - Abnormal; Notable for the following:    Chloride 99 (*)    Calcium, Ion 1.12 (*)    All other components within normal limits    Imaging Review No results found. I have personally reviewed and evaluated these lab results as part of my medical decision-making.   EKG Interpretation None      Filed Vitals:   05/26/15 1935 05/26/15 2132 05/26/15 2149  BP: 129/79 114/77 118/74  Pulse: 85 70 81  Temp: 98.1 F (36.7 C)    TempSrc: Oral    Resp: 24 18 16   Height: 5' 11.5" (1.816 m)    Weight: 73.624 kg    SpO2: 98% 99% 98%     MDM   Meds given in ED:  Medications  enoxaparin (LOVENOX) injection 75 mg (75 mg Subcutaneous Given 05/26/15 2144)    Discharge Medication List as of 05/26/2015  9:23 PM      Final diagnoses:  Pain of right lower leg   This is 64 year old male who presents to the emergency department complaining of right lower leg pain and swelling onset today. He reports tenderness to his calf. He was sent by his primary care doctor's office for DVT study. Patient arrived after 7 PM in this is unable to be obtained. On exam patient is afebrile nontoxic appearing. There is no lower extremity edema noted bilaterally. Patient does have right calf tenderness to palpation. Patient is  neurovascularly intact. He has good pulses and capillary refill to his bilateral lower extremities. Patient reports he had lower extremity edema previously for this is improved with elevating his leg. The patient's i-STAT Chem-8 is unremarkable. I am suspicious for DVT and will provide the patient with Lovenox injection and have him follow-up tomorrow morning for DVT study. Patient agrees to this plan. I advised the patient to follow-up with their primary care provider this week. I advised the patient to return to the emergency department with new or worsening symptoms or new concerns. The patient verbalized understanding and agreement with plan.   This patient was discussed with Dr. Johnney Killian who agrees with assessment and plan.  I personally performed the services described in this documentation, which was scribed in my presence. The recorded information has been reviewed and is accurate.       Waynetta Pean, PA-C 05/26/15 [id number]  Charlesetta Shanks, MD 06/02/15 [phone]

## 2015-05-26 NOTE — ED Notes (Signed)
Dansie, PA at bedside.  

## 2015-05-27 ENCOUNTER — Ambulatory Visit (HOSPITAL_COMMUNITY)
Admission: RE | Admit: 2015-05-27 | Discharge: 2015-05-27 | Disposition: A | Discharge status: Home / Self Care | Payer: Medicare Other | Source: Ambulatory Visit | Attending: Emergency Medicine | Admitting: Emergency Medicine

## 2015-05-27 DIAGNOSIS — M79609 Pain in unspecified limb: Secondary | ICD-10-CM

## 2015-05-27 DIAGNOSIS — M7989 Other specified soft tissue disorders: Secondary | ICD-10-CM | POA: Diagnosis not present

## 2015-05-27 DIAGNOSIS — M79661 Pain in right lower leg: Secondary | ICD-10-CM | POA: Diagnosis not present

## 2015-05-27 NOTE — Progress Notes (Signed)
Preliminary results by tech - Right Lower Ext. Venous Duplex Completed. Negative for deep and superficial vein thrombosis in the right lower extremity. Ford Peddie, BS, RDMS, RVT  

## 2015-06-05 DIAGNOSIS — K449 Diaphragmatic hernia without obstruction or gangrene: Secondary | ICD-10-CM | POA: Diagnosis not present

## 2015-06-05 DIAGNOSIS — R079 Chest pain, unspecified: Secondary | ICD-10-CM | POA: Diagnosis not present

## 2015-06-05 DIAGNOSIS — K219 Gastro-esophageal reflux disease without esophagitis: Secondary | ICD-10-CM | POA: Diagnosis not present

## 2015-06-05 DIAGNOSIS — R0789 Other chest pain: Secondary | ICD-10-CM | POA: Diagnosis not present

## 2015-08-04 DIAGNOSIS — J209 Acute bronchitis, unspecified: Secondary | ICD-10-CM | POA: Diagnosis not present

## 2015-08-12 DIAGNOSIS — J209 Acute bronchitis, unspecified: Secondary | ICD-10-CM | POA: Diagnosis not present

## 2015-08-14 ENCOUNTER — Ambulatory Visit
Admission: RE | Admit: 2015-08-14 | Discharge: 2015-08-14 | Disposition: A | Discharge status: Home / Self Care | Payer: Medicare Other | Source: Ambulatory Visit | Attending: Family Medicine | Admitting: Family Medicine

## 2015-08-14 ENCOUNTER — Other Ambulatory Visit: Payer: Self-pay | Admitting: Family Medicine

## 2015-08-14 DIAGNOSIS — R0602 Shortness of breath: Secondary | ICD-10-CM

## 2015-08-14 DIAGNOSIS — R079 Chest pain, unspecified: Secondary | ICD-10-CM | POA: Diagnosis not present

## 2015-08-14 DIAGNOSIS — R053 Chronic cough: Secondary | ICD-10-CM

## 2015-08-14 DIAGNOSIS — R05 Cough: Secondary | ICD-10-CM

## 2015-08-14 MED ORDER — IOPAMIDOL (ISOVUE-370) INJECTION 76%
100.0000 mL | Freq: Once | INTRAVENOUS | Status: AC | PRN
  Administered 2015-08-14: 100 mL via INTRAVENOUS

## 2015-08-21 ENCOUNTER — Ambulatory Visit (INDEPENDENT_AMBULATORY_CARE_PROVIDER_SITE_OTHER): Payer: Medicare Other | Admitting: Internal Medicine

## 2015-08-21 ENCOUNTER — Encounter: Payer: Self-pay | Admitting: Internal Medicine

## 2015-08-21 VITALS — BP 142/78 | HR 97 | Ht 72.0 in | Wt 156.2 lb

## 2015-08-21 DIAGNOSIS — R05 Cough: Secondary | ICD-10-CM | POA: Diagnosis not present

## 2015-08-21 DIAGNOSIS — R058 Other specified cough: Secondary | ICD-10-CM | POA: Insufficient documentation

## 2015-08-21 MED ORDER — TRAMADOL HCL 50 MG PO TABS: ORAL_TABLET | ORAL | Status: DC

## 2015-08-21 MED ORDER — ACETAMINOPHEN-CODEINE #3 300-30 MG PO TABS: ORAL_TABLET | ORAL | Status: DC

## 2015-08-21 MED ORDER — FLUTTER DEVI: Status: DC

## 2015-08-21 MED ORDER — PREDNISONE 10 MG PO TABS: ORAL_TABLET | ORAL | Status: DC

## 2015-08-21 NOTE — Assessment & Plan Note (Signed)
The most common causes of chronic cough in immunocompetent adults include the following: upper airway cough syndrome (UACS), previously referred to as postnasal drip syndrome (PNDS), which is caused by variety of rhinosinus conditions; (2) asthma; (3) GERD; (4) chronic bronchitis from cigarette smoking or other inhaled environmental irritants; (5) nonasthmatic eosinophilic bronchitis; and (6) bronchiectasis.   These conditions, singly or in combination, have accounted for up to 94% of the causes of chronic cough in prospective studies.   Other conditions have constituted no >6% of the causes in prospective studies These have included bronchogenic carcinoma, chronic interstitial pneumonia, sarcoidosis, left ventricular failure, ACEI-induced cough, and aspiration from a condition associated with pharyngeal dysfunction.    Chronic cough is often simultaneously caused by more than one condition. A single cause has been found from 38 to 82% of the time, multiple causes from 18 to 62%. Multiply caused cough has been the result of three diseases up to 42% of the time.       Based on hx and exam, this is most likely:  Classic Upper airway cough syndrome, so named because it's frequently impossible to sort out how much is  CR/sinusitis with freq throat clearing (which can be related to primary GERD)   vs  causing  secondary (" extra esophageal")  GERD from wide swings in gastric pressure that occur with throat clearing, often  promoting self use of mint and menthol lozenges that reduce the lower esophageal sphincter tone and exacerbate the problem further in a cyclical fashion.   These are the same pts (now being labeled as having "irritable larynx syndrome" by some cough centers) who not infrequently have a history of having failed to tolerate ace inhibitors,  dry powder inhalers or biphosphonates or report having atypical reflux symptoms that don't respond to standard doses of PPI , and are easily confused as  having aecopd or asthma flares by even experienced allergists/ pulmonologists.   The first step is to maximize acid suppression and eliminate cyclical coughing then regroup if the cough persists.  I had an extended discussion with the patient reviewing all relevant studies completed to date and  lasting 35/60  min  1) Explained: The standardized cough guidelines published in Chest by Lissa Morales in 2006 are still the best available and consist of a multiple step process (up to 12!) , not a single office visit,  and are intended  to address this problem logically,  with an alogrithm dependent on response to empiric treatment at  each progressive step  to determine a specific diagnosis with  minimal addtional testing needed. Therefore if adherence is an issue or can't be accurately verified,  it's very unlikely the standard evaluation and treatment will be successful here.    Furthermore, response to therapy (other than acute cough suppression, which should only be used short term with avoidance of narcotic containing cough syrups if possible), can be a gradual process for which the patient may perceive immediate benefit.  Unlike going to an eye doctor where the best perscription is almost always the first one and is immediately effective, this is almost never the case in the management of chronic cough syndromes. Therefore the patient needs to commit up front to consistently adhere to recommendations  for up to 6 weeks of therapy directed at the likely underlying problem(s) before the response can be reasonably evaluated.     2) Each maintenance medication was reviewed in detail including most importantly the difference between maintenance and prns and under  what circumstances the prns are to be triggered using an action plan format that is not reflected in the computer generated alphabetically organized AVS.    Please see instructions for details which were reviewed in writing and the patient given  a copy highlighting the part that I personally wrote and discussed at today's ov.   See instructions for specific recommendations which were reviewed directly with the patient who was given a copy with highlighter outlining the key components.

## 2015-08-21 NOTE — Progress Notes (Signed)
Subjective:    Patient ID: [name], male    DOB: Aug 24, 1950,     MRN: [id number]  HPI  13 yowm quit smoking in 1989 with pattern of pattern of bad cough esp with colds no change before quit or after eval by allergist in [name] referred to pulmonary clinic 08/21/2015 by [name] with "the worst ever" = lasted longer / more severe.  Prev w/u by [name] in 2009 with nl pfts.    08/21/2015 1st [facility] office visit/ Lynniah Janoski   Chief Complaint  Patient presents with  . Pulmonary Consult    Referred by [name]. Pt c/o cough x 1 month- prod with clear, sticky sputum. He has noticed cough is trigerred by talking and exertion. He states he "goes into spasms" 25-30 x per day. He states he has coughed until almost vomits.   usual spells x lifetime  Last  only a few weeks /this did not feel like a typical cold  At onset one month prior to OV   maint on prilosec 20 mg bid pre- onset  Already 4 abx / pred/ no better  Better at hs/ worse with talking  Sometimes ant cp diffuse fleeting p severe cough   No obvious   patterns in day to day or daytime variabilty or assoc pleuritic or ex  cp or chest tightness, subjective wheeze overt sinus or hb symptoms. No unusual exp hx or h/o childhood pna/ asthma or knowledge of premature birth.  Sleeping ok without nocturnal  or early am exacerbation  of respiratory  c/o's or need for noct saba. Also denies any obvious fluctuation of symptoms with weather or environmental changes or other aggravating or alleviating factors except as outlined above   Current Medications, Allergies, Complete Past Medical History, Past Surgical History, Family History, and Social History were reviewed in [employer] record.            Review of Systems  Constitutional: Negative for fever, chills, activity change, appetite change and unexpected weight change.  HENT: Negative for congestion, dental problem, postnasal drip, rhinorrhea,  sneezing, sore throat, trouble swallowing and voice change.   Eyes: Negative for visual disturbance.  Respiratory: Positive for cough and shortness of breath. Negative for choking.   Cardiovascular: Positive for chest pain. Negative for leg swelling.  Gastrointestinal: Negative for nausea, vomiting and abdominal pain.  Genitourinary: Negative for difficulty urinating.       Acid heartburn  Musculoskeletal: Negative for arthralgias.  Skin: Negative for rash.  Psychiatric/Behavioral: Negative for behavioral problems and confusion.       Objective:   Physical Exam  amb wm difficulty standing from chair s assistance, unsteady on his feet/ weak in knees   Wt Readings from Last 3 Encounters:  08/21/15 156 lb 3.2 oz (70.852 kg)  05/26/15 162 lb 5 oz (73.624 kg)  05/13/15 163 lb 3.2 oz (74.027 kg)    Vital signs reviewed   HEENT: nl dentition, turbinates, and oropharynx. Nl external ear canals without cough reflex   NECK :  without JVD/Nodes/TM/ nl carotid upstrokes bilaterally   LUNGS: no acc muscle use,  Nl contour chest which is clear to A and P bilaterally without cough on insp or exp maneuvers   CV:  RRR  no s3 or murmur or increase in P2, no edema   ABD:  soft and nontender with nl inspiratory excursion in the supine position. No bruits or organomegaly, bowel sounds nl  MS:  Nl gait/ ext warm without deformities, calf tenderness, cyanosis or clubbing No obvious joint restrictions   SKIN: warm and dry without lesions    NEURO:  alert, approp, nl sensorium with  no motor deficits     I personally reviewed images and agree with radiology impression as follows:  CTa Chest   08/14/15 No evidence of acute pulmonary embolism is seen. 2. There are a few noncalcified lung nodules      Assessment & Plan:

## 2015-08-21 NOTE — Patient Instructions (Addendum)
The key to effective treatment for your cough is eliminating the non-stop cycle of cough you're stuck in long enough to let your airway heal completely and then see if there is anything still making you cough once you stop the cough suppression, but this should take no more than 5 days to figure out  First take delsym two tsp every 12 hours and supplement if needed with  Tylenol #3  up to 1-2 every 4 hours to suppress the urge to cough at all or even clear your throat. Swallowing water or using ice chips/non mint and menthol containing candies (such as lifesavers or sugarless jolly ranchers) are also effective.  You should rest your voice and avoid activities that you know make you cough.  Once you have eliminated the cough for 3 straight days try reducing the Tylenol #3 first,  then the delsym as tolerated.    Prednisone 10 mg take  4 each am x 2 days,   2 each am x 2 days,  1 each am x 2 days and stop (this is to eliminate allergies and inflammation from coughing)  Whenever coughing > prilosec 20 x 2 x 30 min x before bfast and supper - once you are better x one week then change back to previous dose     GERD (REFLUX)  is an extremely common cause of respiratory symptoms, many times with no significant heartburn at all.    It can be treated with medication, but also with lifestyle changes including avoidance of late meals, excessive alcohol, smoking cessation, and avoid fatty foods, chocolate, peppermint, colas, red wine, and acidic juices such as orange juice.  NO MINT OR MENTHOL PRODUCTS SO NO COUGH DROPS  USE HARD CANDY INSTEAD (jolley ranchers or Stover's or Lifesavers (all available in sugarless versions) NO OIL BASED VITAMINS - use powdered substitutes.  Always cough into the flutter valve to prevent airway trauma   If not 100% better return in 2 weeks

## 2015-08-25 ENCOUNTER — Telehealth: Payer: Self-pay | Admitting: Internal Medicine

## 2015-08-25 DIAGNOSIS — J449 Chronic obstructive pulmonary disease, unspecified: Secondary | ICD-10-CM | POA: Diagnosis not present

## 2015-08-25 DIAGNOSIS — R05 Cough: Secondary | ICD-10-CM | POA: Diagnosis not present

## 2015-08-25 NOTE — Telephone Encounter (Signed)
Called spoke with spouse. appt scheduled to see MW tomorrow at 1:30 for acute visit.  Nothing further needed

## 2015-08-25 NOTE — Telephone Encounter (Signed)
ATC, received fast busy signal x 2. WCB

## 2015-08-25 NOTE — Telephone Encounter (Signed)
Per 08/21/15 OV: Patient Instructions       The key to effective treatment for your cough is eliminating the non-stop cycle of cough you're stuck in long enough to let your airway heal completely and then see if there is anything still making you cough once you stop the cough suppression, but this should take no more than 5 days to figure out  First take delsym two tsp every 12 hours and supplement if needed with  Tylenol #3  up to 1-2 every 4 hours to suppress the urge to cough at all or even clear your throat. Swallowing water or using ice chips/non mint and menthol containing candies (such as lifesavers or sugarless jolly ranchers) are also effective.  You should rest your voice and avoid activities that you know make you cough.  Once you have eliminated the cough for 3 straight days try reducing the Tylenol #3 first,  then the delsym as tolerated.    Prednisone 10 mg take  4 each am x 2 days,   2 each am x 2 days,  1 each am x 2 days and stop (this is to eliminate allergies and inflammation from coughing)  Whenever coughing > prilosec 20 x 2 x 30 min x before bfast and supper - once you are better x one week then change back to previous dose     GERD (REFLUX)  is an extremely common cause of respiratory symptoms, many times with no significant heartburn at all.    It can be treated with medication, but also with lifestyle changes including avoidance of late meals, excessive alcohol, smoking cessation, and avoid fatty foods, chocolate, peppermint, colas, red wine, and acidic juices such as orange juice.   NO MINT OR MENTHOL PRODUCTS SO NO COUGH DROPS  USE HARD CANDY INSTEAD (jolley ranchers or Stover's or Lifesavers (all available in sugarless versions) NO OIL BASED VITAMINS - use powdered substitutes. Always cough into the flutter valve to prevent airway trauma  If not 100% better return in 2 weeks    --  Called spoke with spouse. She reports pt woke up with a big bruise on his left side  (near ribs) this AM. Pt is coughing a lot. Denies any pain in the rib area. Pt has not hit/fell to cause this. Wants to know if this bruising is from all the coughing. She also reports the tylenol w/ codeine helps his cough but it is causing him to be wide awake and unable to sleep. Wants recs? MW is gone for the day. Please advise MR thanks

## 2015-08-25 NOTE — Telephone Encounter (Signed)
Probably rib frcture from coughing. If worried go to ER or work in our office 08/26/15

## 2015-08-26 ENCOUNTER — Ambulatory Visit (INDEPENDENT_AMBULATORY_CARE_PROVIDER_SITE_OTHER): Payer: Medicare Other | Admitting: Internal Medicine

## 2015-08-26 ENCOUNTER — Encounter: Payer: Self-pay | Admitting: Internal Medicine

## 2015-08-26 ENCOUNTER — Ambulatory Visit (INDEPENDENT_AMBULATORY_CARE_PROVIDER_SITE_OTHER)
Admission: RE | Admit: 2015-08-26 | Discharge: 2015-08-26 | Disposition: A | Discharge status: Home / Self Care | Payer: Medicare Other | Source: Ambulatory Visit | Attending: Internal Medicine | Admitting: Internal Medicine

## 2015-08-26 VITALS — BP 110/64 | HR 82 | Ht 72.0 in | Wt 155.0 lb

## 2015-08-26 DIAGNOSIS — R05 Cough: Secondary | ICD-10-CM | POA: Diagnosis not present

## 2015-08-26 DIAGNOSIS — R058 Other specified cough: Secondary | ICD-10-CM

## 2015-08-26 MED ORDER — ALPRAZOLAM 0.5 MG PO TABS: 0.5000 mg | ORAL_TABLET | Freq: Every evening | ORAL | Status: DC | PRN

## 2015-08-26 MED ORDER — ACETAMINOPHEN-CODEINE #3 300-30 MG PO TABS: ORAL_TABLET | ORAL | Status: DC

## 2015-08-26 NOTE — Progress Notes (Signed)
Subjective:    Patient ID: [name], male    DOB: 1950-11-02,     MRN: [id number]    Brief patient profile:  51 yowm quit smoking in 1989 with pattern of pattern of bad cough esp with colds no change before quit or after eval by allergist in [name] referred to pulmonary clinic 08/21/2015 by [name] with "the worst ever cough " = lasted longer / more severe.  Prev w/u by [name] in 2009 with nl pfts.    08/21/2015 1st [facility] office visit/ Wert   Chief Complaint  Patient presents with  . Pulmonary Consult    Referred by [name]. Pt c/o cough x 1 month- prod with clear, sticky sputum. He has noticed cough is trigerred by talking and exertion. He states he "goes into spasms" 25-30 x per day. He states he has coughed until almost vomits.   usual spells x lifetime  Last  only a few weeks /this did not feel like a typical cold  At onset one month prior to OV   maint on prilosec 20 mg bid pre- onset  Already 4 abx / pred/ no better  Better at hs/ worse with talking  Sometimes ant cp diffuse fleeting p severe cough  rec The key to effective treatment for your cough is eliminating the non-stop cycle of cough you're stuck in long enough to let your airway heal completely and then see if there is anything still making you cough once you stop the cough suppression, but this should take no more than 5 days to figure out First take delsym two tsp every 12 hours and supplement if needed with  Tylenol #3  up to 1-2 every 4 hours to suppress the urge to cough at all or even clear your throat.   Once you have eliminated the cough for 3 straight days try reducing the Tylenol #3 first,  then the delsym as tolerated.   Prednisone 10 mg take  4 each am x 2 days,   2 each am x 2 days,  1 each am x 2 days and stop (this is to eliminate allergies and inflammation from coughing) Whenever coughing > prilosec 20 x 2 x 30 min x before bfast and supper - once you are better x one week then  change back to previous dose    GERD diet  Always cough into the flutter valve to prevent airway trauma    08/26/2015 acute extended ov/Wert re: persistent severe cough/ finishing bactrim rx today  Chief Complaint  Patient presents with  . Acute Visit    Pt states woke up with bruise on his left side on 08/25/14- does not recall any injury. He feels pain underneath the left arm. He states that he is having longer periods of time without cough, but cough is more severe when he does cough. He also c/o insomnia since last here.   never took more than one tylenol #3 - cough remains non productive/ denies excess/ purulent sputum or mucus plugs   Extensive bruising L chest wall with pain in L axilla with coughing   No obvious day to day or daytime variability or assoc sob or  chest tightness, subjective wheeze or overt sinus or hb symptoms. No unusual exp hx or h/o childhood pna/ asthma or knowledge of premature birth.  Sleeping ok without nocturnal  or early am exacerbation  of respiratory  c/o's or need for noct saba. Also denies any obvious  fluctuation of symptoms with weather or environmental changes or other aggravating or alleviating factors except as outlined above   Current Medications, Allergies, Complete Past Medical History, Past Surgical History, Family History, and Social History were reviewed in [employer] [facility] record.  ROS  The following are not active complaints unless bolded sore throat, dysphagia, dental problems, itching, sneezing,  nasal congestion or excess/ purulent secretions, ear ache,   fever, chills, sweats, unintended wt loss, classically pleuritic or exertional cp, hemoptysis,  orthopnea pnd or leg swelling, presyncope, palpitations, abdominal pain, anorexia, nausea, vomiting, diarrhea  or change in bowel or bladder habits, change in stools or urine, dysuria,hematuria,  rash, arthralgias, visual complaints, headache, numbness, weakness or ataxia or problems  with walking or coordination,  change in mood/affect or memory.            Objective:   Physical Exam  amb wm difficulty standing from chair s assistance, unsteady on his feet/ weak in knees    08/26/2015       155   08/21/15 156 lb 3.2 oz (70.852 kg)  05/26/15 162 lb 5 oz (73.624 kg)  05/13/15 163 lb 3.2 oz (74.027 kg)    Vital signs reviewed   HEENT: nl dentition, turbinates, and oropharynx. Nl external ear canals without cough reflex   NECK :  without JVD/Nodes/TM/ nl carotid upstrokes bilaterally   LUNGS: no acc muscle use,  Nl contour chest which is clear to A and P bilaterally without cough on insp or exp maneuvers   CV:  RRR  no s3 or murmur or increase in P2, no edema   ABD:  soft and nontender with nl inspiratory excursion in the supine position. No bruits or organomegaly, bowel sounds nl  MS:  Nl gait/ ext warm without deformities, calf tenderness, cyanosis or clubbing No obvious joint restrictions   SKIN: warm and dry with extensive ecchymosis L chest wall   NEURO:  alert, approp, nl sensorium with  no motor deficits     I personally reviewed images and agree with radiology impression as follows:  CTa Chest   08/14/15 No evidence of acute pulmonary embolism is seen. 2. There are a few noncalcified lung nodules   CXR PA and Lateral:   08/26/2015 :    I personally reviewed images and agree with radiology impression as follows:   Left lower lobe atelectasis or early pneumonia superimposed upon findings of chronic bronchitis. Followup PA and lateral chest X-ray is recommended in 3-4 weeks following trial of antibiotic therapy to ensure resolution and exclude underlying malignancy.  Pulmonary nodules demonstrated on the February 27 CT scan are not clearly evident on this plain film and the previous recommendation for follow-up chest CT scanning in 4-6 months is again recommended. My impression:  No rib fx on L, no airspace dz       Assessment & Plan:

## 2015-08-26 NOTE — Patient Instructions (Addendum)
The key to effective treatment for your cough is eliminating the non-stop cycle of cough you're stuck in long enough to let your airway heal completely and then see if there is anything still making you cough once you stop the cough suppression, but this should take no more than 5 days to figure out  First take delsym two tsp every 12 hours and supplement if needed with  Tylenol #3  up to 1-2 every 4 hours to suppress the urge to cough at all or even clear your throat. Swallowing water or using ice chips/non mint and menthol containing candies (such as lifesavers or sugarless jolly ranchers) are also effective.  You should rest your voice and avoid activities that you know make you cough.  Once you have eliminated the cough for 3 straight days try reducing the Tylenol #3 first,  then the delsym as tolerated.     Whenever coughing > prilosec 20 x 2 x 30 min x before bfast and supper - once you are better x one week then change back to previous dose     GERD (REFLUX)  is an extremely common cause of respiratory symptoms, many times with no significant heartburn at all.    It can be treated with medication, but also with lifestyle changes including avoidance of late meals, excessive alcohol, smoking cessation, and avoid fatty foods, chocolate, peppermint, colas, red wine, and acidic juices such as orange juice.  NO MINT OR MENTHOL PRODUCTS SO NO COUGH DROPS  USE HARD CANDY INSTEAD (jolley ranchers or Stover's or Lifesavers (all available in sugarless versions) NO OIL BASED VITAMINS - use powdered substitutes.  Always cough into the flutter valve to prevent airway trauma   Xanax 0.5 mg at bedtime as needed    Please remember to go to the  x-ray department downstairs for your tests - we will call you with the results when they are available   Please see patient coordinator before you leave today  to schedule sinus ct   Pulmonary follow up is as needed

## 2015-08-27 ENCOUNTER — Encounter: Payer: Self-pay | Admitting: Internal Medicine

## 2015-08-27 NOTE — Assessment & Plan Note (Signed)
Did not follow the cyclical cough regimen now has bruising prob from intercostal muscle tear   Again Explained the natural history of uri and why it's necessary in patients at risk to treat GERD aggressively - at least  short term -   to reduce risk of evolving cyclical cough initially  triggered by epithelial injury and a heightened sensitivty to the effects of any upper airway irritants,  most importantly acid - related - then perpetuated by epithelial injury related to the cough itself as the upper airway collapses on itself.  That is, the more sensitive the epithelium becomes once it is damaged by the virus, the more the ensuing irritability> the more the cough, the more the secondary reflux (especially in those prone to reflux) the more the irritation of the sensitive mucosa and so on in a  Classic cyclical pattern.    I had an extended discussion with the patient and wife reviewing all relevant studies completed to date and  lasting 25 minutes of a 53minute visit   The standardized cough guidelines published in Chest by Lissa Morales in 2006 are still the best available and consist of a multiple step process (up to 12!) , not a single office visit,  and are intended  to address this problem logically,  with an alogrithm dependent on response to empiric treatment at  each progressive step  to determine a specific diagnosis with  minimal addtional testing needed. Therefore if adherence is an issue or can't be accurately verified,  it's very unlikely the standard evaluation and treatment will be successful here.    Furthermore, response to therapy (other than acute cough suppression, which should only be used short term with avoidance of narcotic containing cough syrups if possible), can be a gradual process for which the patient may perceive immediate benefit.  Unlike going to an eye doctor where the best perscription is almost always the first one and is immediately effective, this is almost never  the case in the management of chronic cough syndromes. Therefore the patient needs to commit up front to consistently adhere to recommendations  for up to 6 weeks of therapy directed at the likely underlying problem(s) before the response can be reasonably evaluated.    Each maintenance medication was reviewed in detail including most importantly the difference between maintenance and prns and under what circumstances the prns are to be triggered using an action plan format that is not reflected in the computer generated alphabetically organized AVS.    Please see instructions for details which were reviewed in writing and the patient given a copy highlighting the part that I personally wrote and discussed at today's ov.

## 2015-08-29 ENCOUNTER — Telehealth: Payer: Self-pay | Admitting: Internal Medicine

## 2015-08-29 ENCOUNTER — Ambulatory Visit (INDEPENDENT_AMBULATORY_CARE_PROVIDER_SITE_OTHER)
Admission: RE | Admit: 2015-08-29 | Discharge: 2015-08-29 | Disposition: A | Discharge status: Home / Self Care | Payer: Medicare Other | Source: Ambulatory Visit | Attending: Internal Medicine | Admitting: Internal Medicine

## 2015-08-29 DIAGNOSIS — R058 Other specified cough: Secondary | ICD-10-CM

## 2015-08-29 DIAGNOSIS — R0981 Nasal congestion: Secondary | ICD-10-CM | POA: Diagnosis not present

## 2015-08-29 DIAGNOSIS — R05 Cough: Secondary | ICD-10-CM | POA: Diagnosis not present

## 2015-08-29 NOTE — Telephone Encounter (Signed)
Note on instructions: this should take no more than 5 days to figure out  ie heavy cough suppression x no more than 5 days then back off and just use the delsym and follow the other instructions very carefully.

## 2015-08-29 NOTE — Telephone Encounter (Signed)
Patient's wife notified of Dr. Gustavus Bryant recommendations.  Verbalized understanding. Nothing further needed.

## 2015-08-29 NOTE — Progress Notes (Signed)
Quick Note:  Spoke with pt and notified of results per Dr. Wert. Pt verbalized understanding and denied any questions.  ______ 

## 2015-08-29 NOTE — Telephone Encounter (Signed)
Spoke with the pt's spouse  She states that tylenol with codeine is helping cough "85%"  She states that med makes pt "act like he's is [place]" She has been giving him 2 at a time rather than trying just 1 and seeing if that helps  I advised that she try just giving them 1 and see if that helps  She is wondering how long he should stay on med, is worried he will become addicted to it  Pt has ov with MW on 09/08/15 and does not feel he should wait until then  Please advise, thanks!  The key to effective treatment for your cough is eliminating the non-stop cycle of cough you're stuck in long enough to let your airway heal completely and then see if there is anything still making you cough once you stop the cough suppression, but this should take no more than 5 days to figure out  First take delsym two tsp every 12 hours and supplement if needed with Tylenol #3 up to 1-2 every 4 hours to suppress the urge to cough at all or even clear your throat. Swallowing water or using ice chips/non mint and menthol containing candies (such as lifesavers or sugarless jolly ranchers) are also effective. You should rest your voice and avoid activities that you know make you cough.  Once you have eliminated the cough for 3 straight days try reducing the Tylenol #3 first, then the delsym as tolerated.    Whenever coughing > prilosec 20 x 2 x 30 min x before bfast and supper - once you are better x one week then change back to previous dose   GERD (REFLUX) is an extremely common cause of respiratory symptoms, many times with no significant heartburn at all.   It can be treated with medication, but also with lifestyle changes including avoidance of late meals, excessive alcohol, smoking cessation, and avoid fatty foods, chocolate, peppermint, colas, red wine, and acidic juices such as orange juice.  NO MINT OR MENTHOL PRODUCTS SO NO COUGH DROPS  USE HARD CANDY INSTEAD (jolley ranchers or Stover's or  Lifesavers (all available in sugarless versions) NO OIL BASED VITAMINS - use powdered substitutes.  Always cough into the flutter valve to prevent airway trauma   Xanax 0.5 mg at bedtime as needed    Please remember to go to the x-ray department downstairs for your tests - we will call you with the results when they are available   Please see patient coordinator before you leave today to schedule sinus ct   Pulmonary follow up is as needed

## 2015-09-01 ENCOUNTER — Telehealth: Payer: Self-pay | Admitting: Internal Medicine

## 2015-09-01 NOTE — Telephone Encounter (Signed)
Spoke with pt's wife. She is aware of MW's recommendations. OV has been made for 09/02/15 at 3:00pm with MW. Nothing further was needed.

## 2015-09-01 NOTE — Telephone Encounter (Signed)
Spoke with pt's wife. States that the pt's cough is not improving. Has been taking Deslym and Tylenol #3 with no relief. He stopped Tylenol #3, 5 days ago. Has been trying to sleep with his head on an incline but that makes the cough worse. They would like MW's recommendations.  MW - please advise. Thanks.  Patient Instructions     The key to effective treatment for your cough is eliminating the non-stop cycle of cough you're stuck in long enough to let your airway heal completely and then see if there is anything still making you cough once you stop the cough suppression, but this should take no more than 5 days to figure out  First take delsym two tsp every 12 hours and supplement if needed with Tylenol #3 up to 1-2 every 4 hours to suppress the urge to cough at all or even clear your throat. Swallowing water or using ice chips/non mint and menthol containing candies (such as lifesavers or sugarless jolly ranchers) are also effective. You should rest your voice and avoid activities that you know make you cough.  Once you have eliminated the cough for 3 straight days try reducing the Tylenol #3 first, then the delsym as tolerated.    Whenever coughing > prilosec 20 x 2 x 30 min x before bfast and supper - once you are better x one week then change back to previous dose   GERD (REFLUX) is an extremely common cause of respiratory symptoms, many times with no significant heartburn at all.   It can be treated with medication, but also with lifestyle changes including avoidance of late meals, excessive alcohol, smoking cessation, and avoid fatty foods, chocolate, peppermint, colas, red wine, and acidic juices such as orange juice.  NO MINT OR MENTHOL PRODUCTS SO NO COUGH DROPS  USE HARD CANDY INSTEAD (jolley ranchers or Stover's or Lifesavers (all available in sugarless versions) NO OIL BASED VITAMINS - use powdered substitutes.  Always cough into the flutter valve to prevent airway  trauma   Xanax 0.5 mg at bedtime as needed    Please remember to go to the x-ray department downstairs for your tests - we will call you with the results when they are available   Please see patient coordinator before you leave today to schedule sinus ct   Pulmonary follow up is as needed

## 2015-09-01 NOTE — Telephone Encounter (Signed)
Nothing else to offer over the phone, needs ov with all meds and flutter valve in hand to see me or NP's

## 2015-09-02 ENCOUNTER — Ambulatory Visit (INDEPENDENT_AMBULATORY_CARE_PROVIDER_SITE_OTHER)
Admission: RE | Admit: 2015-09-02 | Discharge: 2015-09-02 | Disposition: A | Discharge status: Home / Self Care | Payer: Medicare Other | Source: Ambulatory Visit | Attending: Internal Medicine | Admitting: Internal Medicine

## 2015-09-02 ENCOUNTER — Ambulatory Visit (INDEPENDENT_AMBULATORY_CARE_PROVIDER_SITE_OTHER): Payer: Medicare Other | Admitting: Internal Medicine

## 2015-09-02 ENCOUNTER — Encounter: Payer: Self-pay | Admitting: Internal Medicine

## 2015-09-02 VITALS — BP 108/62 | HR 85 | Ht 72.0 in | Wt 153.2 lb

## 2015-09-02 DIAGNOSIS — E860 Dehydration: Secondary | ICD-10-CM | POA: Diagnosis not present

## 2015-09-02 DIAGNOSIS — R058 Other specified cough: Secondary | ICD-10-CM

## 2015-09-02 DIAGNOSIS — R05 Cough: Secondary | ICD-10-CM

## 2015-09-02 DIAGNOSIS — F322 Major depressive disorder, single episode, severe without psychotic features: Secondary | ICD-10-CM | POA: Diagnosis not present

## 2015-09-02 DIAGNOSIS — J189 Pneumonia, unspecified organism: Secondary | ICD-10-CM | POA: Diagnosis not present

## 2015-09-02 DIAGNOSIS — R0602 Shortness of breath: Secondary | ICD-10-CM | POA: Diagnosis not present

## 2015-09-02 MED ORDER — METHYLPREDNISOLONE ACETATE 80 MG/ML IJ SUSP
120.0000 mg | Freq: Once | INTRAMUSCULAR | Status: AC
  Administered 2015-09-02: 120 mg via INTRAMUSCULAR

## 2015-09-02 MED ORDER — GABAPENTIN 100 MG PO CAPS: 100.0000 mg | ORAL_CAPSULE | Freq: Three times a day (TID) | ORAL | Status: DC

## 2015-09-02 NOTE — Progress Notes (Signed)
Subjective:    Patient ID: [name], male    DOB: 1950/09/09,     MRN: [id number]    Brief patient profile:  37 yowm quit smoking in 1989 with pattern of pattern of bad cough esp with colds no change before quit or after eval by allergist in [name] referred to pulmonary clinic 08/21/2015 by [name] with "the worst ever cough " = lasted longer / more severe.  Prev w/u by [name] in 2009 with nl pfts.     History of Present Illness  08/21/2015 1st [facility] office visit/ [name]   Chief Complaint  Patient presents with  . Pulmonary Consult    Referred by [name]. Pt c/o cough x 1 month- prod with clear, sticky sputum. He has noticed cough is trigerred by talking and exertion. He states he "goes into spasms" 25-30 x per day. He states he has coughed until almost vomits.   usual spells x lifetime  Last  only a few weeks /this did not feel like a typical cold  At onset one month prior to OV   maint on prilosec 20 mg bid pre- onset  Already 4 abx / pred/ no better  Better at hs/ worse with talking  Sometimes ant cp diffuse fleeting p severe cough  rec The key to effective treatment for your cough is eliminating the non-stop cycle of cough you're stuck in long enough to let your airway heal completely and then see if there is anything still making you cough once you stop the cough suppression, but this should take no more than 5 days to figure out First take delsym two tsp every 12 hours and supplement if needed with  Tylenol #3  up to 1-2 every 4 hours to suppress the urge to cough at all or even clear your throat.   Once you have eliminated the cough for 3 straight days try reducing the Tylenol #3 first,  then the delsym as tolerated.   Prednisone 10 mg take  4 each am x 2 days,   2 each am x 2 days,  1 each am x 2 days and stop (this is to eliminate allergies and inflammation from coughing) Whenever coughing > prilosec 20 x 2 x 30 min x before bfast and supper - once  you are better x one week then change back to previous dose    GERD diet  Always cough into the flutter valve to prevent airway trauma    08/26/2015 acute extended ov/[name] re: persistent severe cough/ finishing bactrim rx today  Chief Complaint  Patient presents with  . Acute Visit    Pt states woke up with bruise on his left side on 08/25/14- does not recall any injury. He feels pain underneath the left arm. He states that he is having longer periods of time without cough, but cough is more severe when he does cough. He also c/o insomnia since last here.   never took more than one tylenol #3 - cough remains non productive/ denies excess/ purulent sputum or mucus plugs   Extensive bruising L chest wall with pain in L axilla with coughing  rec First take delsym two tsp every 12 hours and supplement if needed with  Tylenol #3  up to 1-2 every 4 hours to suppress the urge to cough at all or even clear your throat. Swallowing water or using ice chips/non mint and menthol containing candies (such as lifesavers or sugarless jolly ranchers) are  also effective.  You should rest your voice and avoid activities that you know make you cough. Once you have eliminated the cough for 3 straight days try reducing the Tylenol #3 first,  then the delsym as tolerated.   Whenever coughing > prilosec 20 x 2 x 30 min x before bfast and supper - once you are better x one week then change back to previous dose    GERD  Always cough into the flutter valve to prevent airway trauma  Xanax 0.5 mg at bedtime as needed  Sinus CT  > neg     09/02/2015  f/u ov/[name] re: cough since jan 1/ not resp to tylenol #3 x 2 q4, not taking ppi as rec  Chief Complaint  Patient presents with  . Follow-up    Pt c/o continued cough with clear mucus that is more present when pt is in any kind of reclining position. Pt denies wheeze/CP/tightness. Pt reports that once he starts coughing he begins to have "spasms" that last several minutes  and often require sips of water and hard candy to resolve. Pt is very concerned and per wife is getting depressed. Pt also c/o frequents "sweats" and low grade fevers.   sense of something stuck at suprasternal notch and if just coughs hard enough it will come up / cp resolved  New night sweats since 2/26 when temp of 100 per wife but no fever since.  No obvious day to day or daytime variability or assoc sob or excess/ purulent sputum or mucus plugs  chest tightness, subjective wheeze or overt sinus or hb symptoms. No unusual exp hx or h/o childhood pna/ asthma or knowledge of premature birth.  Sleeping ok without nocturnal  or early am exacerbation  of respiratory  c/o's or need for noct saba. Also denies any obvious fluctuation of symptoms with weather or environmental changes or other aggravating or alleviating factors except as outlined above   Current Medications, Allergies, Complete Past Medical History, Past Surgical History, Family History, and Social History were reviewed in [employer] record.  ROS  The following are not active complaints unless bolded sore throat, dysphagia, dental problems, itching, sneezing,  nasal congestion or excess/ purulent secretions, ear ache,   fever, chills, sweats, unintended wt loss, classically pleuritic or exertional cp, hemoptysis,  orthopnea pnd or leg swelling, presyncope, palpitations, abdominal pain, anorexia, nausea, vomiting, diarrhea  or change in bowel or bladder habits, change in stools or urine, dysuria,hematuria,  rash, arthralgias, visual complaints, headache, numbness, weakness or ataxia or problems with walking or coordination,  change in mood/affect or memory.            Objective:   Physical Exam  amb wm difficulty standing from chair s assistance, unsteady on his feet/ weak in knees   09/02/2015        153  08/26/2015       155   08/21/15 156 lb 3.2 oz (70.852 kg)  05/26/15 162 lb 5 oz (73.624 kg)  05/13/15 163  lb 3.2 oz (74.027 kg)    Vital signs reviewed   HEENT: nl dentition, turbinates, and oropharynx. Nl external ear canals without cough reflex   NECK :  without JVD/Nodes/TM/ nl carotid upstrokes bilaterally   LUNGS: no acc muscle use,  Nl contour chest which is clear to A and P bilaterally without cough on insp or exp maneuvers   CV:  RRR  no s3 or murmur or increase in P2, no edema  ABD:  soft and nontender with nl inspiratory excursion in the supine position. No bruits or organomegaly, bowel sounds nl  MS:  Nl gait/ ext warm without deformities, calf tenderness, cyanosis or clubbing No obvious joint restrictions   SKIN: warm and dry with  Resolving ecchymosis L chest wall   NEURO:  alert, approp, nl sensorium with  no motor deficits     I personally reviewed images and agree with radiology impression as follows:  CTa Chest   08/14/15 No evidence of acute pulmonary embolism is seen. 2. There are a few noncalcified lung nodules    CXR PA and Lateral:   09/02/2015 :    I personally reviewed images and agree with radiology impression as follows:   Significant increased bilateral upper lobe airspace opacities are noted concerning for pneumonia or possibly edema. Follow-up radiographs are recommended.       Assessment & Plan:

## 2015-09-02 NOTE — Patient Instructions (Addendum)
Please remember to go to the   x-ray department downstairs for your tests - we will call you with the results when they are available.  Avoid speaking/ sleep in recliner to prevent coughing fits and use the flutter on max resistance as much as possible  Prilosec 20 mg x 2 Take 30- 60 min before your first and last meals of the day and Pepcid ac 20 mg at bedtime  For drainage / throat tickle try take CHLORPHENIRAMINE  4 mg - take one every 4 hours as needed - available over the counter- may cause drowsiness so start with just a bedtime dose or two and see how you tolerate it before trying in daytime    Depomedrol 120 mg IM today   If not better w/in a few days > gabapentin 100 mg three times a day with meals

## 2015-09-03 ENCOUNTER — Telehealth: Payer: Self-pay | Admitting: Internal Medicine

## 2015-09-03 DIAGNOSIS — J189 Pneumonia, unspecified organism: Secondary | ICD-10-CM

## 2015-09-03 MED ORDER — LEVOFLOXACIN 500 MG PO TABS: 500.0000 mg | ORAL_TABLET | Freq: Every day | ORAL | Status: DC

## 2015-09-03 NOTE — Telephone Encounter (Signed)
Notes Recorded by Tanda Rockers, MD on 09/03/2015 at 6:48 AM Call pt: Reviewed cxr and C/w pnemonia now that explains the sweats but not the problem since Jan 1 so needs to do everything in instructions and levaquin 500 x 7 days nad ov with cxr in 2 weeks, if getting worse to ER as nothing else to offer as outpt ------------------------ Spoke with pt's wife, aware of results/recs.  rx sent to preferred pharmacy.  cxr ordered.  Nothing further needed.

## 2015-09-03 NOTE — Assessment & Plan Note (Signed)
See cxr 09/02/2015 > rx levaquin 500 mg x 7 days and f/u by September 16 2015   He had been on bactrim at last ov which was probably not all that effective for resp pathogens and    now reporting mew sweats and low grade fever with bilateral upper lobe infitrates c/w pna - technically could be considered HCAP but rx is the same  If not improving on po levaquin or can't acquire it/keep it down for any reason will need admit.

## 2015-09-03 NOTE — Assessment & Plan Note (Addendum)
Cough continues unabated since first of jan 2017 "just like it always does" strongly suggesting cyclical cough from what was Orlie Dakin  Explained the natural history of uri and why it's necessary in patients at risk to treat GERD aggressively - at least  short term -   to reduce risk of evolving cyclical cough initially  triggered by epithelial injury and a heightened sensitivty to the effects of any upper airway irritants,  most importantly acid - related - then perpetuated by epithelial injury related to the cough itself as the upper airway collapses on itself.  That is, the more sensitive the epithelium becomes once it is damaged by the virus, the more the ensuing irritability> the more the cough, the more the secondary reflux (especially in those prone to reflux) the more the irritation of the sensitive mucosa and so on in a  Classic cyclical pattern.  May need gabapentin if no resp to 1st gen h1 and max gerd rx as failed tylenol #3  I had an extended discussion with the patient and wife  reviewing all relevant studies completed to date and  lasting 15 to 20 minutes of a 25 minute visit    Each maintenance medication was reviewed in detail including most importantly the difference between maintenance and prns and under what circumstances the prns are to be triggered using an action plan format that is not reflected in the computer generated alphabetically organized AVS.    Please see instructions for details which were reviewed in writing and the patient given a copy highlighting the part that I personally wrote and discussed at today's ov.

## 2015-09-04 ENCOUNTER — Emergency Department (HOSPITAL_COMMUNITY): Payer: Medicare Other

## 2015-09-04 ENCOUNTER — Inpatient Hospital Stay (HOSPITAL_COMMUNITY)
Admission: EM | Admit: 2015-09-04 | Discharge: 2015-09-09 | DRG: 190 | Disposition: A | Discharge status: Home Health | Payer: Medicare Other | Attending: Internal Medicine | Admitting: Internal Medicine

## 2015-09-04 ENCOUNTER — Telehealth: Payer: Self-pay | Admitting: Internal Medicine

## 2015-09-04 ENCOUNTER — Encounter (HOSPITAL_COMMUNITY): Payer: Self-pay

## 2015-09-04 DIAGNOSIS — F329 Major depressive disorder, single episode, unspecified: Secondary | ICD-10-CM | POA: Diagnosis not present

## 2015-09-04 DIAGNOSIS — E785 Hyperlipidemia, unspecified: Secondary | ICD-10-CM | POA: Diagnosis present

## 2015-09-04 DIAGNOSIS — R05 Cough: Secondary | ICD-10-CM | POA: Diagnosis present

## 2015-09-04 DIAGNOSIS — J111 Influenza due to unidentified influenza virus with other respiratory manifestations: Secondary | ICD-10-CM | POA: Insufficient documentation

## 2015-09-04 DIAGNOSIS — K210 Gastro-esophageal reflux disease with esophagitis: Secondary | ICD-10-CM | POA: Diagnosis not present

## 2015-09-04 DIAGNOSIS — R0609 Other forms of dyspnea: Secondary | ICD-10-CM

## 2015-09-04 DIAGNOSIS — K219 Gastro-esophageal reflux disease without esophagitis: Secondary | ICD-10-CM | POA: Diagnosis present

## 2015-09-04 DIAGNOSIS — R058 Other specified cough: Secondary | ICD-10-CM | POA: Diagnosis present

## 2015-09-04 DIAGNOSIS — Z9842 Cataract extraction status, left eye: Secondary | ICD-10-CM

## 2015-09-04 DIAGNOSIS — F988 Other specified behavioral and emotional disorders with onset usually occurring in childhood and adolescence: Secondary | ICD-10-CM | POA: Diagnosis not present

## 2015-09-04 DIAGNOSIS — K2270 Barrett's esophagus without dysplasia: Secondary | ICD-10-CM | POA: Diagnosis present

## 2015-09-04 DIAGNOSIS — E210 Primary hyperparathyroidism: Secondary | ICD-10-CM | POA: Diagnosis present

## 2015-09-04 DIAGNOSIS — J1000 Influenza due to other identified influenza virus with unspecified type of pneumonia: Secondary | ICD-10-CM | POA: Diagnosis not present

## 2015-09-04 DIAGNOSIS — J441 Chronic obstructive pulmonary disease with (acute) exacerbation: Secondary | ICD-10-CM | POA: Diagnosis not present

## 2015-09-04 DIAGNOSIS — M1990 Unspecified osteoarthritis, unspecified site: Secondary | ICD-10-CM | POA: Diagnosis present

## 2015-09-04 DIAGNOSIS — Z87891 Personal history of nicotine dependence: Secondary | ICD-10-CM | POA: Diagnosis not present

## 2015-09-04 DIAGNOSIS — R059 Cough, unspecified: Secondary | ICD-10-CM | POA: Insufficient documentation

## 2015-09-04 DIAGNOSIS — R0602 Shortness of breath: Secondary | ICD-10-CM | POA: Diagnosis not present

## 2015-09-04 DIAGNOSIS — J440 Chronic obstructive pulmonary disease with acute lower respiratory infection: Secondary | ICD-10-CM | POA: Diagnosis not present

## 2015-09-04 DIAGNOSIS — R0789 Other chest pain: Secondary | ICD-10-CM | POA: Diagnosis not present

## 2015-09-04 DIAGNOSIS — J189 Pneumonia, unspecified organism: Secondary | ICD-10-CM | POA: Diagnosis not present

## 2015-09-04 DIAGNOSIS — R0600 Dyspnea, unspecified: Secondary | ICD-10-CM | POA: Diagnosis not present

## 2015-09-04 DIAGNOSIS — I341 Nonrheumatic mitral (valve) prolapse: Secondary | ICD-10-CM | POA: Diagnosis present

## 2015-09-04 DIAGNOSIS — R0603 Acute respiratory distress: Secondary | ICD-10-CM | POA: Diagnosis present

## 2015-09-04 DIAGNOSIS — Z9841 Cataract extraction status, right eye: Secondary | ICD-10-CM

## 2015-09-04 DIAGNOSIS — Z79899 Other long term (current) drug therapy: Secondary | ICD-10-CM

## 2015-09-04 DIAGNOSIS — Z96642 Presence of left artificial hip joint: Secondary | ICD-10-CM | POA: Diagnosis present

## 2015-09-04 DIAGNOSIS — F32A Depression, unspecified: Secondary | ICD-10-CM | POA: Diagnosis present

## 2015-09-04 LAB — CBC WITH DIFFERENTIAL/PLATELET
BASOS ABS: 0 10*3/uL (ref 0.0–0.1)
BASOS PCT: 0 %
Eosinophils Absolute: 0 10*3/uL (ref 0.0–0.7)
Eosinophils Relative: 0 %
HEMATOCRIT: 35.3 % — AB (ref 39.0–52.0)
HEMOGLOBIN: 11.5 g/dL — AB (ref 13.0–17.0)
LYMPHS PCT: 8 %
Lymphs Abs: 0.7 10*3/uL (ref 0.7–4.0)
MCH: 30.2 pg (ref 26.0–34.0)
MCHC: 32.6 g/dL (ref 30.0–36.0)
MCV: 92.7 fL (ref 78.0–100.0)
MONO ABS: 0.5 10*3/uL (ref 0.1–1.0)
MONOS PCT: 6 %
NEUTROS ABS: 7.7 10*3/uL (ref 1.7–7.7)
NEUTROS PCT: 86 %
Platelets: 450 10*3/uL — ABNORMAL HIGH (ref 150–400)
RBC: 3.81 MIL/uL — ABNORMAL LOW (ref 4.22–5.81)
RDW: 12.9 % (ref 11.5–15.5)
WBC: 9 10*3/uL (ref 4.0–10.5)

## 2015-09-04 LAB — BASIC METABOLIC PANEL
ANION GAP: 14 (ref 5–15)
BUN: 12 mg/dL (ref 6–20)
CHLORIDE: 100 mmol/L — AB (ref 101–111)
CO2: 25 mmol/L (ref 22–32)
Calcium: 8.9 mg/dL (ref 8.9–10.3)
Creatinine, Ser: 0.91 mg/dL (ref 0.61–1.24)
GFR calc non Af Amer: >60 mL/min (ref >60)
GLUCOSE: 104 mg/dL — AB (ref 65–99)
Potassium: 4 mmol/L (ref 3.5–5.1)
Sodium: 139 mmol/L (ref 135–145)

## 2015-09-04 LAB — I-STAT TROPONIN, ED: TROPONIN I, POC: 0 ng/mL (ref 0.00–0.08)

## 2015-09-04 LAB — INFLUENZA PANEL BY PCR (TYPE A & B)
H1N1 flu by pcr: NOT DETECTED
Influenza A By PCR: POSITIVE — AB
Influenza B By PCR: NEGATIVE

## 2015-09-04 LAB — LACTIC ACID, PLASMA
LACTIC ACID, VENOUS: 2.6 mmol/L — AB (ref 0.5–2.0)
LACTIC ACID, VENOUS: 1.6 mmol/L (ref 0.5–2.0)

## 2015-09-04 LAB — BRAIN NATRIURETIC PEPTIDE: B NATRIURETIC PEPTIDE 5: 113.3 pg/mL — AB (ref 0.0–100.0)

## 2015-09-04 LAB — I-STAT CG4 LACTIC ACID, ED
LACTIC ACID, VENOUS: 2.2 mmol/L — AB (ref 0.5–2.0)
Lactic Acid, Venous: 2.62 mmol/L (ref 0.5–2.0)

## 2015-09-04 LAB — STREP PNEUMONIAE URINARY ANTIGEN: Strep Pneumo Urinary Antigen: NEGATIVE

## 2015-09-04 LAB — GLUCOSE, CAPILLARY: GLUCOSE-CAPILLARY: 121 mg/dL — AB (ref 65–99)

## 2015-09-04 MED ORDER — DEXTROSE 5 % IV SOLN: 1.0000 g | INTRAVENOUS | Status: DC

## 2015-09-04 MED ORDER — BUDESONIDE 0.5 MG/2ML IN SUSP
0.2500 mg | Freq: Two times a day (BID) | RESPIRATORY_TRACT | Status: DC
  Administered 2015-09-04 – 2015-09-07 (×7): 0.25 mg via RESPIRATORY_TRACT
  Filled 2015-09-04 (×11): qty 2

## 2015-09-04 MED ORDER — SIMVASTATIN 20 MG PO TABS
20.0000 mg | ORAL_TABLET | Freq: Every day | ORAL | Status: DC
  Administered 2015-09-05 – 2015-09-08 (×4): 20 mg via ORAL
  Filled 2015-09-04 (×5): qty 1

## 2015-09-04 MED ORDER — DEXTROSE 5 % IV SOLN
500.0000 mg | INTRAVENOUS | Status: DC
  Administered 2015-09-05 – 2015-09-07 (×3): 500 mg via INTRAVENOUS
  Filled 2015-09-04 (×4): qty 500

## 2015-09-04 MED ORDER — OSELTAMIVIR PHOSPHATE 75 MG PO CAPS
75.0000 mg | ORAL_CAPSULE | Freq: Two times a day (BID) | ORAL | Status: AC
  Administered 2015-09-04 – 2015-09-09 (×10): 75 mg via ORAL
  Filled 2015-09-04 (×14): qty 1

## 2015-09-04 MED ORDER — ADULT MULTIVITAMIN W/MINERALS CH
1.0000 | ORAL_TABLET | Freq: Every day | ORAL | Status: DC
  Administered 2015-09-05 – 2015-09-09 (×5): 1 via ORAL
  Filled 2015-09-04 (×6): qty 1

## 2015-09-04 MED ORDER — BUPROPION HCL ER (SR) 100 MG PO TB12
200.0000 mg | ORAL_TABLET | Freq: Two times a day (BID) | ORAL | Status: DC
  Administered 2015-09-04 – 2015-09-09 (×10): 200 mg via ORAL
  Filled 2015-09-04 (×14): qty 2

## 2015-09-04 MED ORDER — CETYLPYRIDINIUM CHLORIDE 0.05 % MT LIQD
7.0000 mL | Freq: Two times a day (BID) | OROMUCOSAL | Status: DC
  Administered 2015-09-04 – 2015-09-09 (×10): 7 mL via OROMUCOSAL

## 2015-09-04 MED ORDER — SODIUM CHLORIDE 0.9 % IV BOLUS (SEPSIS)
500.0000 mL | Freq: Once | INTRAVENOUS | Status: AC
  Administered 2015-09-04: 500 mL via INTRAVENOUS

## 2015-09-04 MED ORDER — FAMOTIDINE 20 MG PO TABS
20.0000 mg | ORAL_TABLET | Freq: Every day | ORAL | Status: DC
  Administered 2015-09-04 – 2015-09-08 (×5): 20 mg via ORAL
  Filled 2015-09-04 (×5): qty 1

## 2015-09-04 MED ORDER — PANTOPRAZOLE SODIUM 40 MG PO TBEC
40.0000 mg | DELAYED_RELEASE_TABLET | Freq: Two times a day (BID) | ORAL | Status: DC
  Administered 2015-09-04 – 2015-09-09 (×10): 40 mg via ORAL
  Filled 2015-09-04 (×10): qty 1

## 2015-09-04 MED ORDER — DEXTROSE 5 % IV SOLN
1.0000 g | INTRAVENOUS | Status: DC
  Administered 2015-09-05 – 2015-09-07 (×3): 1 g via INTRAVENOUS
  Filled 2015-09-04 (×4): qty 10

## 2015-09-04 MED ORDER — METHYLPHENIDATE HCL 5 MG PO TABS
10.0000 mg | ORAL_TABLET | Freq: Three times a day (TID) | ORAL | Status: DC
  Administered 2015-09-05 – 2015-09-09 (×14): 10 mg via ORAL
  Filled 2015-09-04 (×14): qty 2

## 2015-09-04 MED ORDER — SODIUM CHLORIDE 0.9 % IV SOLN
Freq: Once | INTRAVENOUS | Status: AC
  Administered 2015-09-04: 15:00:00 via INTRAVENOUS

## 2015-09-04 MED ORDER — ADULT MULTIVITAMIN W/MINERALS CH: 1.0000 | ORAL_TABLET | Freq: Every day | ORAL | Status: DC

## 2015-09-04 MED ORDER — ALPRAZOLAM 0.5 MG PO TABS
0.5000 mg | ORAL_TABLET | Freq: Every evening | ORAL | Status: DC | PRN
  Administered 2015-09-04 – 2015-09-08 (×5): 0.5 mg via ORAL
  Filled 2015-09-04 (×5): qty 1

## 2015-09-04 MED ORDER — HYDROCODONE-HOMATROPINE 5-1.5 MG/5ML PO SYRP
5.0000 mL | ORAL_SOLUTION | ORAL | Status: DC | PRN
  Administered 2015-09-04 – 2015-09-09 (×13): 5 mL via ORAL
  Filled 2015-09-04 (×14): qty 5

## 2015-09-04 MED ORDER — PHENOL 1.4 % MT LIQD
1.0000 | OROMUCOSAL | Status: DC | PRN
  Administered 2015-09-04: 1 via OROMUCOSAL
  Filled 2015-09-04 (×3): qty 177

## 2015-09-04 MED ORDER — ENOXAPARIN SODIUM 40 MG/0.4ML ~~LOC~~ SOLN
40.0000 mg | SUBCUTANEOUS | Status: DC
  Administered 2015-09-04 – 2015-09-08 (×5): 40 mg via SUBCUTANEOUS
  Filled 2015-09-04 (×5): qty 0.4

## 2015-09-04 MED ORDER — ALBUTEROL SULFATE (2.5 MG/3ML) 0.083% IN NEBU
5.0000 mg | INHALATION_SOLUTION | Freq: Once | RESPIRATORY_TRACT | Status: AC
  Administered 2015-09-04: 5 mg via RESPIRATORY_TRACT
  Filled 2015-09-04: qty 6

## 2015-09-04 MED ORDER — BOOST / RESOURCE BREEZE PO LIQD
1.0000 | Freq: Three times a day (TID) | ORAL | Status: DC
  Administered 2015-09-04 – 2015-09-08 (×13): 1 via ORAL
  Filled 2015-09-04: qty 1

## 2015-09-04 MED ORDER — SODIUM CHLORIDE 0.9 % IV BOLUS (SEPSIS)
1000.0000 mL | Freq: Once | INTRAVENOUS | Status: AC
  Administered 2015-09-04: 1000 mL via INTRAVENOUS

## 2015-09-04 MED ORDER — LEVOFLOXACIN IN D5W 750 MG/150ML IV SOLN
750.0000 mg | INTRAVENOUS | Status: DC
  Administered 2015-09-04: 750 mg via INTRAVENOUS
  Filled 2015-09-04: qty 150

## 2015-09-04 MED ORDER — GABAPENTIN 100 MG PO CAPS
100.0000 mg | ORAL_CAPSULE | Freq: Three times a day (TID) | ORAL | Status: DC
  Administered 2015-09-04 – 2015-09-09 (×15): 100 mg via ORAL
  Filled 2015-09-04 (×15): qty 1

## 2015-09-04 MED ORDER — FLUOXETINE HCL 20 MG PO CAPS
40.0000 mg | ORAL_CAPSULE | Freq: Every day | ORAL | Status: DC
  Administered 2015-09-05 – 2015-09-09 (×5): 40 mg via ORAL
  Filled 2015-09-04 (×5): qty 2

## 2015-09-04 NOTE — Progress Notes (Signed)
NURSING PROGRESS NOTE  [name] [id number] Admission Data: 09/04/2015 7:52 PM Attending Provider: Waldemar Dickens, MD [id number], MD Code Status: Full  [name] is a 65 y.o. male patient admitted from ED:  -No acute distress noted.  -No complaints of shortness of breath.  -No complaints of chest pain.   Cardiac Monitoring: Box # 19 in place. Cardiac monitor yields:normal sinus rhythm.  Blood pressure 98/53, pulse 76, temperature 99 F (37.2 C), temperature source Oral, resp. rate 20, height 6' (1.829 m), weight 68.493 kg (151 lb), SpO2 100 %.   IV Fluids:  IV in place, occlusive dsg intact without redness, IV cath forearm right, condition patent and no redness none.   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  Past Surgical History:   has past surgical history that includes Cholecystectomy (2002); Mastoidectomy (1996); Appendectomy (1984); Cataract extraction; Parathyroidectomy; Nasal septum surgery; Total hip revision (06/14/2011); and Joint replacement.  Social History:   reports that he quit smoking about 28 years ago. His smoking use included Cigarettes. He has a 36 pack-year smoking history. He has quit using smokeless tobacco. He reports that he drinks about 0.6 oz of alcohol per week. He reports that he uses illicit drugs (Marijuana).  Skin: Intact  Patient/Family orientated to room. Information packet given to patient/family. Admission inpatient armband information verified with patient/family to include name and date of birth and placed on patient arm. Side rails up x 2, fall assessment and education completed with patient/family. Patient/family able to verbalize understanding of  risk associated with falls and verbalized understanding to call for assistance before getting out of bed. Call light within reach. Patient/family able to voice and demonstrate understanding of unit orientation instructions.    Will continue to evaluate and treat per MD orders.

## 2015-09-04 NOTE — ED Notes (Addendum)
Patient here with increased shortness of breath and family reports that patient was diagnosed with pneumonia yesterday, increased weakness for same

## 2015-09-04 NOTE — Telephone Encounter (Signed)
Error.Stanley A Dalton ° °

## 2015-09-04 NOTE — H&P (Signed)
[facility] and Physical                                                                                    Dre Forgacs, is a 65 y.o. male  MRN: [id number]   DOB - Jun 24, 1951  Admit Date - 09/04/2015  Outpatient Primary MD for the patient is Lilian Coma, MD  Referring MD: Maryan Rued / ER  Consulting M.D: Lady Gary / Orthopedics  PMH: Past Medical History  Diagnosis Date  . Pneumonia 2009  . 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 Mitral valve prolapse     does not see cardiologist for. last stress test 2003  . Hypercalcemia   . Arthritis   . GERD (gastroesophageal reflux disease)   . Lipoma     left forearm (3)  . Dysrhythmia     ":Due to MVP"  . Bronchitis     history of  . Tumor cells, benign     lung, left side  . Hiatal hernia       PSH: Past Surgical History  Procedure Laterality Date  . Cholecystectomy  2002  . Mastoidectomy  1996  . Appendectomy  1984  . Cataract extraction      L and R eye  . Parathyroidectomy    . Nasal septum surgery      sinus surgery  . Total hip revision  06/14/2011    Procedure: TOTAL HIP REVISION;  Surgeon: Kerin Salen;  Location: [facility];  Service: Orthopedics;  Laterality: Left;  Left Acetabular  Hip Revision  . Joint replacement      Lt hip     CC:  Chief Complaint  Patient presents with  . Shortness of Breath     HPI: 65 year old male patient has a history of GERD and Barrett's esophagitis, depression and ADD on medications, dyslipidemia, history of hyperparathyroidism status post parathyroidectomy, prior hospitalization for ileus with small bowel obstruction, and history of left hip fracture with total hip replacement. According to the patient and his wife over the past 50 days he has had issues with chronic paroxysmal coughing that is worse when he lays backwards. At times this is associated with shortness of breath and/or dyspnea on exertion. He's had multiple outpatient evaluations:  cardiology, GI and pulmonary medicine. It was felt the patient had reflux mediated upper airway cough syndrome and he has been maintained on breakfast and lunch Prilosec and bedtime Pepcid. About 2 weeks ago he had bronchitis-type symptomatology and was prescribed Bactrim by the pulmonologist. He returned on 2/28 and chest x-ray revealed bilateral upper lobe pneumonia and patient was started on Levaquin on 3/1 of which he took one dose. He reports worsening shortness of breath and dyspnea on exertion and nonproductive cough. He is now having fevers with MAXIMUM TEMPERATURE is up to 102F. Because of these symptoms he was sent to the ER for further evaluation. He has reported pleuritic chest pain especially on the right. He has intermittent palpitations over the past months. He has had trouble maintaining his weight since his previous hospitalization for ileus/bowel obstruction.  ER Evaluation and treatment: Afebrile, BP 103/63, pulse 84 and regular, respirations 22  up to 32 after paroxysmal coughing episode, room air saturations 96% EKG: Sinus rhythm with ventricular rate 78 bpm, QTC 460 ms, subtle downsloping ST segments V4 and V5 but insignificant and not consistent with ischemia. 2 View CXR: Bilateral upper lobe pneumonia more conspicuous on the left today than previously demonstrated on 2/28. Laboratory data: NA 139,K 4.0, BUN 12, Cr 0.91, glucose 104, BNP 113, troponin 0.00, lactic acid 2.62 with repeat 2.22, WBCs 9000, neutrophils 86% absolute neutrophils 7.7%, hemoglobin 11.5, platelets 450,000 Proventil nebulizer 1 Normal saline IV fluids at 125 per hour Levaquin 750 mg IV 1 dose  Review of Systems   In addition to the HPI above,  No Headache, changes with Vision or hearing, new weakness, tingling, numbness in any extremity, No problems swallowing food or Liquids No Abdominal pain, N/V; no melena or hematochezia, no dark tarry stools, Bowel movements are regular, No dysuria, hematuria or  flank pain No new skin rashes, lesions, masses or bruises, No new joints pains-aches No polyuria, polydypsia or polyphagia,  *A full 10 point Review of Systems was done, except as stated above, all other Review of Systems were negative.  Social History Social History  Substance Use Topics  . Smoking status: Former Smoker -- 1.50 packs/day for 24 years    Types: Cigarettes    Quit date: 07/06/1987  . Smokeless tobacco: Former Systems developer  . Alcohol Use: 0.6 oz/week    1 Cans of beer per week    Resides at: Private residence  Lives with:  Spouse  Ambulatory status: Without assistive devices   Family History Family History  Problem Relation Age of Onset  . Lung cancer Father 54    smoked  . Cancer Brother     prostate  . Heart Problems Mother   . Heart Problems Father      Prior to Admission medications   Medication Sig Start Date End Date Taking? Authorizing Provider  ALPRAZolam Duanne Moron) 0.5 MG tablet Take 1 tablet (0.5 mg total) by mouth at bedtime as needed for anxiety. 08/26/15  Yes Tanda Rockers, MD  buPROPion [employer] SR) 200 MG 12 hr tablet Take 200 mg by mouth 2 (two) times daily.   Yes Historical Provider, MD  Coenzyme Q10 (COQ-10) 200 MG CAPS Take 1 capsule by mouth daily.   Yes Historical Provider, MD  [place] 2 each by mouth daily.   Yes Historical Provider, MD  FLUoxetine (PROZAC) 40 MG capsule Take 40 mg by mouth daily.     Yes Historical Provider, MD  gabapentin (NEURONTIN) 100 MG capsule Take 1 capsule (100 mg total) by mouth 3 (three) times daily. One three times daily 09/02/15  Yes Tanda Rockers, MD  levofloxacin (LEVAQUIN) 500 MG tablet Take 1 tablet (500 mg total) by mouth daily. 09/03/15  Yes Tanda Rockers, MD  methylphenidate (RITALIN) 10 MG tablet Take 10 mg by mouth 3 (three) times daily.     Yes Historical Provider, MD  Multiple Vitamin (MULTIVITAMIN WITH MINERALS) TABS tablet Take 1 tablet by mouth daily.   Yes Historical  Provider, MD  omeprazole (PRILOSEC) 20 MG capsule Take 40 mg by mouth 2 (two) times daily before a meal.    Yes Historical Provider, MD  Probiotic Product (PROBIOTIC DAILY) CAPS Take 1 capsule by mouth daily. Reported on 08/21/2015   Yes Historical Provider, MD  Respiratory Therapy Supplies (FLUTTER) DEVI Use as directed 08/21/15  Yes Tanda Rockers, MD  simvastatin (ZOCOR) 20 MG tablet  Take 20 mg by mouth at bedtime.     Yes Historical Provider, MD  albuterol (PROAIR HFA) 108 (90 Base) MCG/ACT inhaler Inhale 2 puffs into the lungs every 6 (six) hours as needed for wheezing or shortness of breath. Reported on 09/02/2015    Historical Provider, MD    Allergies  Allergen Reactions  . Ciprofloxacin Hcl Other (See Comments)    Burning in ear when drops were used  . Asa [Aspirin]     Physical Exam  Vitals  Blood pressure 96/82, pulse 78, temperature 98 F (36.7 C), temperature source Oral, resp. rate 32, height 6' (1.829 m), weight 152 lb (68.947 kg), SpO2 100 %.   General:  In no acute distress, appears Chronically ill and malnourished  Psych:  Normal affect, Denies Suicidal or Homicidal ideations, Awake Alert, Oriented X 3. Speech and thought patterns are clear and appropriate, no apparent short term memory deficits  Neuro:   No focal neurological deficits, CN II through XII intact, Strength 5/5 all 4 extremities, Sensation intact all 4 extremities.  ENT:  Ears and Eyes appear Normal, Conjunctivae clear, PER. Moist oral mucosa without erythema or exudates.  Neck:  Supple, No lymphadenopathy appreciated  Respiratory:  Symmetrical chest wall movement, Good air movement bilaterally, coarse to auscultation and more dominant in upper airways. Room Air  Cardiac:  RRR, No Murmurs, no LE edema noted, no JVD, No carotid bruits, peripheral pulses palpable at 2+  Abdomen:  Positive bowel sounds, Soft, Non tender, Non distended,  No masses appreciated, no obvious hepatosplenomegaly  Skin:  No  Cyanosis, Normal Skin Turgor, No Skin Rash or Bruise.  Extremities: Symmetrical without obvious trauma or injury,  no effusions.  Data Review  CBC  Recent Labs Lab 09/04/15 1012  WBC 9.0  HGB 11.5*  HCT 35.3*  PLT 450*  MCV 92.7  MCH 30.2  MCHC 32.6  RDW 12.9  LYMPHSABS 0.7  MONOABS 0.5  EOSABS 0.0  BASOSABS 0.0    Chemistries   Recent Labs Lab 09/04/15 1012  NA 139  K 4.0  CL 100*  CO2 25  GLUCOSE 104*  BUN 12  CREATININE 0.91  CALCIUM 8.9    estimated creatinine clearance is 78.9 mL/min (by C-G formula based on Cr of 0.91).  No results for input(s): TSH, T4TOTAL, T3FREE, THYROIDAB in the last 72 hours.  Invalid input(s): FREET3  Coagulation profile No results for input(s): INR, PROTIME in the last 168 hours.  No results for input(s): DDIMER in the last 72 hours.  Cardiac Enzymes No results for input(s): CKMB, TROPONINI, MYOGLOBIN in the last 168 hours.  Invalid input(s): CK  Invalid input(s): POCBNP  Urinalysis    Component Value Date/Time   COLORURINE AMBER* 08/17/2012 2120   APPEARANCEUR CLOUDY* 08/17/2012 2120   LABSPEC 1.037* 08/17/2012 2120   PHURINE 6.0 08/17/2012 2120   GLUCOSEU NEGATIVE 08/17/2012 2120   HGBUR NEGATIVE 08/17/2012 2120   BILIRUBINUR SMALL* 08/17/2012 2120   KETONESUR 15* 08/17/2012 2120   PROTEINUR 30* 08/17/2012 2120   UROBILINOGEN 1.0 08/17/2012 2120   NITRITE NEGATIVE 08/17/2012 2120   LEUKOCYTESUR TRACE* 08/17/2012 2120    Imaging results:   Dg Chest 2 View  09/04/2015  CLINICAL DATA:  Cough, shortness of breath for the past 50 days, history of chronic bronchitis previous tobacco use, previous episodes of pneumonia. EXAM: CHEST  2 VIEW COMPARISON:  Chest x-ray of September 02, 2015 an chest x-ray dated May 02, 2015. FINDINGS: There are persistent confluent alveolar infiltrates  in the upper lobes. These are somewhat more conspicuous today especially on the left. Coarse infrahilar density on the right is more  conspicuous today. There is no pleural effusion. The heart and pulmonary vascularity are normal. The mediastinum is normal in width. The bony thorax exhibits no acute abnormality. There is an old fracture of the lateral aspect of the left sixth rib. IMPRESSION: Bilateral upper lobe pneumonia more conspicuous on the left today than previously demonstrated. Infrahilar atelectasis or pneumonia on the right. Electronically Signed   By: David  Martinique M.D.   On: 09/04/2015 12:37   Dg Chest 2 View  09/02/2015  CLINICAL DATA:  Shortness of breath, productive cough. EXAM: CHEST  2 VIEW COMPARISON:  August 26, 2015. FINDINGS: Stable cardiomediastinal silhouette. Old distal left clavicular fracture is noted. No pneumothorax or pleural effusion is noted. Significantly increased bilateral upper lobe airspace opacities are noted concerning for pneumonia or possibly edema. Osteophyte formation is noted in the thoracic spine. IMPRESSION: Significant increased bilateral upper lobe airspace opacities are noted concerning for pneumonia or possibly edema. Follow-up radiographs are recommended. Electronically Signed   By: Marijo Conception, M.D.   On: 09/02/2015 16:51   Dg Chest 2 View  08/27/2015  ADDENDUM REPORT: 08/27/2015 10:03 ADDENDUM: In the conclusion of the report I misspoke and said "left lower lobe atelectasis" when in fact I should have said "right lower lobe atelectasis or early pneumonia". I also misspoke and indicated that "pulmonary nodules demonstrated on the February 27th CT scan.". I should have stated "CT scan of the chest of August 14, 2015.". The remainder of the impression is correct. I apologize for the confusion these errors have has caused. Electronically Signed   By: David  Martinique M.D.   On: 08/27/2015 10:03  08/27/2015  CLINICAL DATA:  One month of cough and chest congestion, history of pneumonia, mitral valve prolapse, former smoker. EXAM: CHEST  2 VIEW COMPARISON:  Chest x-ray of May 02, 2015  and CT scan of the chest of August 14, 2015. FINDINGS: The lungs are well-expanded. The interstitial markings are chronically increased. However, increased interstitial density in the right infrahilar region posteriorly is present today. There is no air bronchogram. There is no pleural effusion or parenchymal mass. The trachea is midline. There is mild multilevel degenerative disc disease of the thoracic spine. IMPRESSION: Left lower lobe atelectasis or early pneumonia superimposed upon findings of chronic bronchitis. Followup PA and lateral chest X-ray is recommended in 3-4 weeks following trial of antibiotic therapy to ensure resolution and exclude underlying malignancy. Pulmonary nodules demonstrated on the February 27 CT scan are not clearly evident on this plain film and the previous recommendation for follow-up chest CT scanning in 4-6 months is again recommended. Electronically Signed: By: David  Martinique M.D. On: 08/26/2015 15:45   Ct Angio Chest Pe W/cm &/or Wo Cm  08/14/2015  CLINICAL DATA:  Chronic cough, shortness of breath for 5 days, right posterior chest pain EXAM: CT ANGIOGRAPHY CHEST WITH CONTRAST TECHNIQUE: Multidetector CT imaging of the chest was performed using the standard protocol during bolus administration of intravenous contrast. Multiplanar CT image reconstructions and MIPs were obtained to evaluate the vascular anatomy. CONTRAST:  100 cc Isovue 370 COMPARISON:  Chest x-ray of 05/02/2015, and CT chest of 05/15/2009 Creatinine was obtained on site at [facility] at [address]Results: Creatinine 0.9 mg/dL. GFR of 89. FINDINGS: The pulmonary artery are well opacified and there is no evidence of acute pulmonary embolism. The thoracic aorta  also opacifies with no acute abnormality noted. The origins of the great vessels are patent no mediastinal or hilar adenopathy is seen. No significant coronary artery calcifications are noted. There is no evidence of pericardial effusion.  There is a moderate amount of epicardial fat present palpable incidental liver cysts are noted in the left lobe. Bilateral simple appearing renal cysts are present. The thyroid gland is unremarkable. On lung window images mild biapical pleural parenchymal scarring is present. A noncalcified nodule is present near the right lung apex posteriorly on image 11 measuring 6 mm, not definitely seen on the prior CT. There is a poorly defined 6 mm noncalcified nodule in the posterior aspect of the superior segment of the right lower lobe on image 31. Also, this nodule is not seen on the prior CT. A 5 mm noncalcified nodule is present in the left lower lobe laterally on image 39. This nodule was present and has not changed significantly compared to the CT from 2010. No additional pulmonary nodule is seen. In view of a few new lung nodules, a followup CT chest scan in 4-6 months is recommended to assess stability. No parenchymal infiltrate is noted and there is no evidence of pleural effusion. There are degenerative changes throughout the thoracic spine. Review of the MIP images confirms the above findings. IMPRESSION: 1. No evidence of acute pulmonary embolism is seen. 2. There are a few noncalcified lung nodules, not seen on the CT from 2010. Therefore followup CT chest in 4-6 months is recommended to assess stability. Electronically Signed   By: Ivar Drape M.D.   On: 08/14/2015 15:06   [place] Cm  08/29/2015  CLINICAL DATA:  Upper airway cough syndrome EXAM: CT PARANASAL SINUS LIMITED WITHOUT CONTRAST TECHNIQUE: Non-contiguous multidetector CT images of the paranasal sinuses were obtained in a single plane without contrast. COMPARISON:  None. FINDINGS: Mild mucosal thickening in the maxillary sinus bilaterally. Right maxillary sinus clear. Mild mucosal edema in the ethmoid sinuses bilaterally. Prior sinus surgery with partial ethmoidectomy and medial antrectomy bilaterally. The frontal and sphenoid with  sinuses are clear. No air-fluid level. Mastoid sinus clear. No significant soft tissue abnormality. IMPRESSION: Mild mucosal edema in the ethmoid sinuses and the maxillary sinuses. Remaining sinuses are clear. Prior sinus surgery. Electronically Signed   By: Franchot Gallo M.D.   On: 08/29/2015 13:07     EKG: (Independently reviewed)  Sinus rhythm with ventricular rate 78 bpm, QTC 460 ms, subtle downsloping ST segments V4 and V5 but insignificant and not consistent with ischemia.     Assessment & Plan  Principal Problem:   Acute respiratory distress/CAP  -Presents with progressive shortness of breath worsened baseline and associated fevers and abnormal chest x-ray concerning for pneumonia -Admit to telemetry floor/Obs -Initiate pneumonia focused orders including broad-spectrum antibiotics -Supportive care with oxygen as needed-check ambulatory pulse oximetry -Check urinary strep and Legionella as well as blood cultures and sputum culture -Influenza PCR and respiratory viral panel **POSITIVE for Flu A so begin Tamiflu -Patient wife report poor oral intake since onset of symptoms and suspected dehydration. This is reflected in patient's persistently elevated lactate of around 2-at present does not meet sepsis criteria but we'll continue to cycle lactate -Continue IV fluids at 125/hr  Active Problems:   Upper airway cough syndrome/G E R D/Barrett's esophagus -Has undergone extensive workup as an outpatient with pulmonology -Chloraseptic Spray -Budesonide nebulizers twice a day to ease paroxysmal coughing likely secondary to chronic recurrent inflammation -Continue PPI and  H2 blockers as prior to admission -Reports recent EGD without significant issues -Recent outpatient CT of the chest unremarkable except for several noncalcified lung nodules with recommendations for follow-up CT in 4-6 months (study was ordered by outpatient pulmonologist)    Dyspnea on exertion -Patient reports was  evaluated by cardiology and underwent stress test without any significant findings ([facility]) -Check 2-D echocardiogram    HLD  -Continue preadmission medication    Depression/ADD  -Continue preadmission medications    Hyperparathyroidism, primary, s/p MI parathyroidectomy 7/18, 3.1 gm adenoma    DVT Prophylaxis: Lovenox  Family Communication:   Wife at bedside  Code Status: Full code   Condition:  Stable  Discharge disposition: Anticipate discharge back to previous environment  Time spent in minutes : 60      Shakiyah Cirilo L. ANP on 09/04/2015 at 2:37 PM  You may contact me by going to [email or web address] - password [email or web address]  I am available from 7a-7p but please confirm I am on the schedule by going to Amion as above.   After 7p please contact night coverage person covering me after hours  [facility]

## 2015-09-04 NOTE — ED Provider Notes (Signed)
CSN: [id number]     Arrival date & time 09/04/15  [id number] History   First MD Initiated Contact with Patient 09/04/15 1014     Chief Complaint  Patient presents with  . Shortness of Breath     (Consider location/radiation/quality/duration/timing/severity/associated sxs/prior Treatment) HPI [name] is a 65 y.o. male with history of mitral valve prolapse, GERD, bronchitis, pneumonia, presents to emergency department complaining of cough. Patient states that this cough started approximately 2 months ago. This started after what he describes as "esophageal spasms." Patient states he had an extensive workup for that including an endoscopy. He states since then the cough has been worsening. He is unable to lay down flat or sleep. He has been followed by pulmonology, he has tried several courses of antibiotics, steroids, breathing treatments, states all with no improvement. He has had several x-rays and had a CT angiography of the chest done 1 month ago which was negative for PE. It did show several calcified nodules which did not appear cancerous at this time. Patient states that about 4 days ago, he started running a fever. He states that he had a temperature up to 102. He also reports sweats and chills especially at nighttime. His wife has noticed increased shortness of breath. He was seen by his pulmonologist and was called yesterday and was told that his chest x-ray at that time showed possible pneumonia. He was told to come to the emergency department. Patient states his cough is nonproductive. He admits to some pain in his upper chest that is worsened with coughing. He denies any other URI symptoms. Admits to shortness of breath, which she states is worse on exertion. He is now unable to walk but only a few steps at a time because of cough and shortness of breath. His wife also states he is not eating. He has lost some weight and he is becoming weak and has to use a cane when walking.   Past Medical  History  Diagnosis Date  . Pneumonia 2009  . 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 Mitral valve prolapse     does not see cardiologist for. last stress test 2003  . Hypercalcemia   . Arthritis   . GERD (gastroesophageal reflux disease)   . Lipoma     left forearm (3)  . Dysrhythmia     ":Due to MVP"  . Bronchitis     history of  . Tumor cells, benign     lung, left side  . Hiatal hernia    Past Surgical History  Procedure Laterality Date  . Cholecystectomy  2002  . Mastoidectomy  1996  . Appendectomy  1984  . Cataract extraction      L and R eye  . Parathyroidectomy    . Nasal septum surgery      sinus surgery  . Total hip revision  06/14/2011    Procedure: TOTAL HIP REVISION;  Surgeon: Kerin Salen;  Location: [place];  Service: Orthopedics;  Laterality: Left;  Left Acetabular  Hip Revision  . Joint replacement      Lt hip   Family History  Problem Relation Age of Onset  . Lung cancer Father 92    smoked  . Cancer Brother     prostate  . Heart Problems Mother   . Heart Problems Father    Social History  Substance Use Topics  . Smoking status: Former Smoker -- 1.50 packs/day for 24 years    Types:  Cigarettes    Quit date: 07/06/1987  . Smokeless tobacco: Former Systems developer  . Alcohol Use: 0.6 oz/week    1 Cans of beer per week    Review of Systems  Constitutional: Positive for fever and chills.  Respiratory: Positive for cough, chest tightness and shortness of breath.   Cardiovascular: Negative for chest pain, palpitations and leg swelling.  Gastrointestinal: Negative for nausea, vomiting, abdominal pain, diarrhea and abdominal distention.  Genitourinary: Negative for dysuria, urgency, frequency and hematuria.  Musculoskeletal: Negative for myalgias, arthralgias, neck pain and neck stiffness.  Skin: Negative for rash.  Allergic/Immunologic: Negative for immunocompromised state.  Neurological: Negative for dizziness, weakness, light-headedness, numbness and  headaches.  All other systems reviewed and are negative.     Allergies  Ciprofloxacin hcl and Asa  Home Medications   Prior to Admission medications   Medication Sig Start Date End Date Taking? Authorizing Provider  ALPRAZolam Duanne Moron) 0.5 MG tablet Take 1 tablet (0.5 mg total) by mouth at bedtime as needed for anxiety. 08/26/15  Yes Tanda Rockers, MD  buPROPion [facility] SR) 200 MG 12 hr tablet Take 200 mg by mouth 2 (two) times daily.   Yes Historical Provider, MD  Coenzyme Q10 (COQ-10) 200 MG CAPS Take 1 capsule by mouth daily.   Yes Historical Provider, MD  [facility] 2 each by mouth daily.   Yes Historical Provider, MD  FLUoxetine (PROZAC) 40 MG capsule Take 40 mg by mouth daily.     Yes Historical Provider, MD  gabapentin (NEURONTIN) 100 MG capsule Take 1 capsule (100 mg total) by mouth 3 (three) times daily. One three times daily 09/02/15  Yes Tanda Rockers, MD  levofloxacin (LEVAQUIN) 500 MG tablet Take 1 tablet (500 mg total) by mouth daily. 09/03/15  Yes Tanda Rockers, MD  methylphenidate (RITALIN) 10 MG tablet Take 10 mg by mouth 3 (three) times daily.     Yes Historical Provider, MD  Multiple Vitamin (MULTIVITAMIN WITH MINERALS) TABS tablet Take 1 tablet by mouth daily.   Yes Historical Provider, MD  omeprazole (PRILOSEC) 20 MG capsule Take 40 mg by mouth 2 (two) times daily before a meal.    Yes Historical Provider, MD  Probiotic Product (PROBIOTIC DAILY) CAPS Take 1 capsule by mouth daily. Reported on 08/21/2015   Yes Historical Provider, MD  Respiratory Therapy Supplies (FLUTTER) DEVI Use as directed 08/21/15  Yes Tanda Rockers, MD  simvastatin (ZOCOR) 20 MG tablet Take 20 mg by mouth at bedtime.     Yes Historical Provider, MD  albuterol (PROAIR HFA) 108 (90 Base) MCG/ACT inhaler Inhale 2 puffs into the lungs every 6 (six) hours as needed for wheezing or shortness of breath. Reported on 09/02/2015    Historical Provider, MD   BP 103/63 mmHg  Pulse 84   Temp(Src) 98 F (36.7 C) (Oral)  Resp 20  Ht 6' (1.829 m)  Wt 68.947 kg  BMI 20.61 kg/m2  SpO2 96% Physical Exam  Constitutional: He appears well-developed and well-nourished. No distress.  HENT:  Head: Normocephalic and atraumatic.  Eyes: Conjunctivae are normal.  Neck: Neck supple.  Cardiovascular: Normal rate, regular rhythm and normal heart sounds.   Pulmonary/Chest: He has no wheezes. He has no rales. He exhibits no tenderness.  Tachypnea  Abdominal: Soft. Bowel sounds are normal. He exhibits no distension. There is no tenderness. There is no rebound.  Musculoskeletal: He exhibits no edema.  Neurological: He is alert.  Skin: Skin is warm and  dry.  Nursing note and vitals reviewed.   ED Course  Procedures (including critical care time) Labs Review Labs Reviewed  CBC WITH DIFFERENTIAL/PLATELET - Abnormal; Notable for the following:    RBC 3.81 (*)    Hemoglobin 11.5 (*)    HCT 35.3 (*)    Platelets 450 (*)    All other components within normal limits  BASIC METABOLIC PANEL - Abnormal; Notable for the following:    Chloride 100 (*)    Glucose, Bld 104 (*)    All other components within normal limits  I-STAT CG4 LACTIC ACID, ED - Abnormal; Notable for the following:    Lactic Acid, Venous 2.62 (*)    All other components within normal limits  BRAIN NATRIURETIC PEPTIDE  I-STAT TROPOININ, ED    Imaging Review Dg Chest 2 View  09/02/2015  CLINICAL DATA:  Shortness of breath, productive cough. EXAM: CHEST  2 VIEW COMPARISON:  August 26, 2015. FINDINGS: Stable cardiomediastinal silhouette. Old distal left clavicular fracture is noted. No pneumothorax or pleural effusion is noted. Significantly increased bilateral upper lobe airspace opacities are noted concerning for pneumonia or possibly edema. Osteophyte formation is noted in the thoracic spine. IMPRESSION: Significant increased bilateral upper lobe airspace opacities are noted concerning for pneumonia or possibly edema.  Follow-up radiographs are recommended. Electronically Signed   By: Marijo Conception, M.D.   On: 09/02/2015 16:51   I have personally reviewed and evaluated these images and lab results as part of my medical decision-making.   EKG Interpretation   Date/Time:  Thursday September 04 2015 09:57:14 EST Ventricular Rate:  78 PR Interval:  132 QRS Duration: 78 QT Interval:  404 QTC Calculation: 460 R Axis:   87 Text Interpretation:  Normal sinus rhythm Normal ECG Confirmed by Maryan Rued   MD, WHITNEY ([id number]) on 09/04/2015 10:22:51 AM      MDM   Final diagnoses:  CAP (community acquired pneumonia)    Pt with SOB and cough for 2 months. Now with fever, worsening sob, cough. Saw Dr. Melvyn Novas 3 days ago, possible pneumonia on CXR. Sent here.   VS normal, other than tachypnea. Not hypoxic. Afebrile. No recent travel. Negative CT angio 1 month ago. Labs pending. CXR pending.    Chest x-ray here showing worsening bilateral upper lobe pneumonia. I called and spoke to Dr. Melvyn Novas who advised to start on Levaquin IV, workup for community-acquired pneumonia. He states the patient has not had any antibiotics from him in the last month. However patient does state that he believes he has had 4 courses of antibiotics. Patient was just started on Levaquin yesterday. Will start IV here. I spoke with hospitalist and will get patient admitted. VS remain stable. Pt did receive albuterol treatment but states it did not help. Will hold off on any more.     Filed Vitals:   09/04/15 1002 09/04/15 1141 09/04/15 1300  BP: 103/63  96/82  Pulse: 84  78  Temp: 98 F (36.7 C)    TempSrc: Oral    Resp: 20  32  Height: 6' (1.829 m)    Weight: 68.947 kg    SpO2: 96% 100% 100%       Jeannett Senior, PA-C 09/04/15 [place], MD 09/07/15 2131

## 2015-09-05 ENCOUNTER — Ambulatory Visit (HOSPITAL_COMMUNITY): Payer: Medicare Other

## 2015-09-05 DIAGNOSIS — F329 Major depressive disorder, single episode, unspecified: Secondary | ICD-10-CM | POA: Diagnosis not present

## 2015-09-05 DIAGNOSIS — K2270 Barrett's esophagus without dysplasia: Secondary | ICD-10-CM | POA: Diagnosis not present

## 2015-09-05 DIAGNOSIS — Z87891 Personal history of nicotine dependence: Secondary | ICD-10-CM | POA: Diagnosis not present

## 2015-09-05 DIAGNOSIS — F988 Other specified behavioral and emotional disorders with onset usually occurring in childhood and adolescence: Secondary | ICD-10-CM | POA: Diagnosis present

## 2015-09-05 DIAGNOSIS — Z79899 Other long term (current) drug therapy: Secondary | ICD-10-CM | POA: Diagnosis not present

## 2015-09-05 DIAGNOSIS — Z9841 Cataract extraction status, right eye: Secondary | ICD-10-CM | POA: Diagnosis not present

## 2015-09-05 DIAGNOSIS — K219 Gastro-esophageal reflux disease without esophagitis: Secondary | ICD-10-CM | POA: Diagnosis not present

## 2015-09-05 DIAGNOSIS — M1990 Unspecified osteoarthritis, unspecified site: Secondary | ICD-10-CM | POA: Diagnosis present

## 2015-09-05 DIAGNOSIS — R0602 Shortness of breath: Secondary | ICD-10-CM | POA: Diagnosis not present

## 2015-09-05 DIAGNOSIS — E210 Primary hyperparathyroidism: Secondary | ICD-10-CM | POA: Diagnosis present

## 2015-09-05 DIAGNOSIS — R0600 Dyspnea, unspecified: Secondary | ICD-10-CM

## 2015-09-05 DIAGNOSIS — R05 Cough: Secondary | ICD-10-CM | POA: Diagnosis not present

## 2015-09-05 DIAGNOSIS — I341 Nonrheumatic mitral (valve) prolapse: Secondary | ICD-10-CM | POA: Diagnosis present

## 2015-09-05 DIAGNOSIS — J1000 Influenza due to other identified influenza virus with unspecified type of pneumonia: Secondary | ICD-10-CM | POA: Diagnosis present

## 2015-09-05 DIAGNOSIS — J441 Chronic obstructive pulmonary disease with (acute) exacerbation: Secondary | ICD-10-CM | POA: Diagnosis present

## 2015-09-05 DIAGNOSIS — J440 Chronic obstructive pulmonary disease with acute lower respiratory infection: Secondary | ICD-10-CM | POA: Diagnosis present

## 2015-09-05 DIAGNOSIS — E785 Hyperlipidemia, unspecified: Secondary | ICD-10-CM | POA: Diagnosis present

## 2015-09-05 DIAGNOSIS — Z9842 Cataract extraction status, left eye: Secondary | ICD-10-CM | POA: Diagnosis not present

## 2015-09-05 DIAGNOSIS — Z96642 Presence of left artificial hip joint: Secondary | ICD-10-CM | POA: Diagnosis present

## 2015-09-05 DIAGNOSIS — F909 Attention-deficit hyperactivity disorder, unspecified type: Secondary | ICD-10-CM | POA: Diagnosis not present

## 2015-09-05 DIAGNOSIS — J189 Pneumonia, unspecified organism: Secondary | ICD-10-CM | POA: Diagnosis not present

## 2015-09-05 DIAGNOSIS — R0609 Other forms of dyspnea: Secondary | ICD-10-CM

## 2015-09-05 LAB — CBC WITH DIFFERENTIAL/PLATELET
BASOS ABS: 0 10*3/uL (ref 0.0–0.1)
BASOS PCT: 0 %
EOS ABS: 0.1 10*3/uL (ref 0.0–0.7)
Eosinophils Relative: 1 %
HCT: 30.8 % — ABNORMAL LOW (ref 39.0–52.0)
HEMOGLOBIN: 9.8 g/dL — AB (ref 13.0–17.0)
Lymphocytes Relative: 11 %
Lymphs Abs: 0.8 10*3/uL (ref 0.7–4.0)
MCH: 29.5 pg (ref 26.0–34.0)
MCHC: 31.8 g/dL (ref 30.0–36.0)
MCV: 92.8 fL (ref 78.0–100.0)
MONOS PCT: 9 %
Monocytes Absolute: 0.7 10*3/uL (ref 0.1–1.0)
NEUTROS PCT: 79 %
Neutro Abs: 5.9 10*3/uL (ref 1.7–7.7)
Platelets: 390 10*3/uL (ref 150–400)
RBC: 3.32 MIL/uL — ABNORMAL LOW (ref 4.22–5.81)
RDW: 13.1 % (ref 11.5–15.5)
WBC: 7.5 10*3/uL (ref 4.0–10.5)

## 2015-09-05 LAB — RESPIRATORY VIRUS PANEL
ADENOVIRUS: NEGATIVE
Influenza A: POSITIVE — AB
Influenza B: NEGATIVE
METAPNEUMOVIRUS: NEGATIVE
PARAINFLUENZA 3 A: NEGATIVE
Parainfluenza 1: NEGATIVE
Parainfluenza 2: NEGATIVE
RHINOVIRUS: NEGATIVE
Respiratory Syncytial Virus A: NEGATIVE
Respiratory Syncytial Virus B: NEGATIVE

## 2015-09-05 LAB — COMPREHENSIVE METABOLIC PANEL
ALK PHOS: 171 U/L — AB (ref 38–126)
ALT: 78 U/L — AB (ref 17–63)
AST: 50 U/L — ABNORMAL HIGH (ref 15–41)
Albumin: 2 g/dL — ABNORMAL LOW (ref 3.5–5.0)
Anion gap: 9 (ref 5–15)
BILIRUBIN TOTAL: 0.4 mg/dL (ref 0.3–1.2)
BUN: 12 mg/dL (ref 6–20)
CALCIUM: 8 mg/dL — AB (ref 8.9–10.3)
CO2: 23 mmol/L (ref 22–32)
CREATININE: 0.89 mg/dL (ref 0.61–1.24)
Chloride: 106 mmol/L (ref 101–111)
Glucose, Bld: 90 mg/dL (ref 65–99)
Potassium: 4 mmol/L (ref 3.5–5.1)
SODIUM: 138 mmol/L (ref 135–145)
TOTAL PROTEIN: 5.1 g/dL — AB (ref 6.5–8.1)

## 2015-09-05 LAB — EXPECTORATED SPUTUM ASSESSMENT W REFEX TO RESP CULTURE

## 2015-09-05 LAB — EXPECTORATED SPUTUM ASSESSMENT W GRAM STAIN, RFLX TO RESP C

## 2015-09-05 LAB — TROPONIN I

## 2015-09-05 LAB — GLUCOSE, CAPILLARY
GLUCOSE-CAPILLARY: 84 mg/dL (ref 65–99)
GLUCOSE-CAPILLARY: 102 mg/dL — AB (ref 65–99)

## 2015-09-05 LAB — LEGIONELLA ANTIGEN, URINE

## 2015-09-05 LAB — HIV ANTIBODY (ROUTINE TESTING W REFLEX): HIV SCREEN 4TH GENERATION: NONREACTIVE

## 2015-09-05 MED ORDER — METHYLPREDNISOLONE SODIUM SUCC 125 MG IJ SOLR
60.0000 mg | Freq: Two times a day (BID) | INTRAMUSCULAR | Status: DC
  Administered 2015-09-05 – 2015-09-06 (×2): 60 mg via INTRAVENOUS
  Filled 2015-09-05 (×2): qty 2

## 2015-09-05 MED ORDER — MORPHINE SULFATE (PF) 2 MG/ML IV SOLN: 1.0000 mg | INTRAVENOUS | Status: DC | PRN

## 2015-09-05 MED ORDER — SODIUM CHLORIDE 0.9 % IV BOLUS (SEPSIS)
500.0000 mL | Freq: Once | INTRAVENOUS | Status: AC
  Administered 2015-09-05: 500 mL via INTRAVENOUS

## 2015-09-05 MED ORDER — HYDROCOD POLST-CPM POLST ER 10-8 MG/5ML PO SUER
5.0000 mL | Freq: Three times a day (TID) | ORAL | Status: DC
  Administered 2015-09-05 – 2015-09-08 (×12): 5 mL via ORAL
  Filled 2015-09-05 (×12): qty 5

## 2015-09-05 NOTE — Evaluation (Signed)
Clinical/Bedside Swallow Evaluation Patient Details  Name: [name] MRN: [id number] Date of Birth: [birth year]  Today's Date: 09/05/2015 Time: SLP Start Time (ACUTE ONLY): 37 SLP Stop Time (ACUTE ONLY): 1700 SLP Time Calculation (min) (ACUTE ONLY): 30 min  Past Medical History:  Past Medical History  Diagnosis Date  . Pneumonia 2009  . 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 Mitral valve prolapse     does not see cardiologist for. last stress test 2003  . Hypercalcemia   . Arthritis   . GERD (gastroesophageal reflux disease)   . Lipoma     left forearm (3)  . Dysrhythmia     ":Due to MVP"  . Bronchitis     history of  . Tumor cells, benign     lung, left side  . Hiatal hernia    Past Surgical History:  Past Surgical History  Procedure Laterality Date  . Cholecystectomy  2002  . Mastoidectomy  1996  . Appendectomy  1984  . Cataract extraction      L and R eye  . Parathyroidectomy    . Nasal septum surgery      sinus surgery  . Total hip revision  06/14/2011    Procedure: TOTAL HIP REVISION;  Surgeon: Kerin Salen;  Location: [place];  Service: Orthopedics;  Laterality: Left;  Left Acetabular  Hip Revision  . Joint replacement      Lt hip   HPI:  65 year old male patient PMH: GERD and Barrett's esophagitis, depression and ADD on medications, dyslipidemia, history of hyperparathyroidism status post parathyroidectomy, who was admitted to the hospital with respiratory distress from flu and/or bacterial pneumonia. Patient c/o of 52 days straight of coughing fits and spasm which he is unable to control.   Assessment / Plan / Recommendation Clinical Impression  Patient presents with a primary pharyngo-esophageal dysphagia. Patient exhibits hoarse voice, increased effort in swallowing thin liquids but no overt s/s of aspiration. Patient reported 52 days of coughing fits/spasms that he is unable to control. Patient had recent h/o of bronchitis but it appears as  though his reflux symptoms may have irritated his vocal cords and contributed to his coughing. Patient may also be having laryngeal or esophageal spasms and cannot r./o possibility of vocal cord dysfunction    Aspiration Risk  Mild aspiration risk    Diet Recommendation Regular;Thin liquid   Liquid Administration via: Cup;Straw Medication Administration: Whole meds with liquid Supervision: Patient able to self feed    Other  Recommendations Recommended Consults:  (ENT to assess vocal cords and r/o vocal cord dysfunction) Oral Care Recommendations: Oral care BID   Follow up Recommendations  None    Frequency and Duration            Prognosis        Swallow Study   General Date of Onset: 09/04/15 HPI: 65 year old male patient PMH: GERD and Barrett's esophagitis, depression and ADD on medications, dyslipidemia, history of hyperparathyroidism status post parathyroidectomy, who was admitted to the hospital with respiratory distress from flu and/or bacterial pneumonia. Patient c/o of 52 days straight of coughing fits and spasm which he is unable to control. Type of Study: Bedside Swallow Evaluation Previous Swallow Assessment: N/A Diet Prior to this Study: Regular;Thin liquids Temperature Spikes Noted: No History of Recent Intubation: No Behavior/Cognition: Alert;Cooperative;Pleasant mood;Other (Comment) (frustrated that no relief from coughing) Oral Cavity Assessment: Within Functional Limits Oral Care Completed by SLP: No Oral Cavity - Dentition: Adequate natural dentition Vision:  Functional for self-feeding Self-Feeding Abilities: Able to feed self Patient Positioning: Upright in bed Baseline Vocal Quality: Hoarse Volitional Cough: Strong Volitional Swallow: Able to elicit    Oral/Motor/Sensory Function Overall Oral Motor/Sensory Function: Within functional limits   Ice Chips     Thin Liquid Thin Liquid: Impaired Presentation: Cup;Self Fed;Straw Pharyngeal  Phase  Impairments: Other (comments) (increased effort to swallow)    Nectar Thick     Honey Thick     Puree Puree: Not tested   Solid   GO   Solid: Not tested        Nadara Mode Tarrell 09/05/2015,5:35 PM     Sonia Baller, MA, CCC-SLP 09/05/2015 5:36 PM

## 2015-09-05 NOTE — Progress Notes (Signed)
Initial Nutrition Assessment  INTERVENTION:  -Continue Boost Breeze BID, 250 kcal, 9 grams of protein per serving  NUTRITION DIAGNOSIS:   Inadequate oral intake related to poor appetite as evidenced by per patient/family report, meal completion < 50%.  GOAL:   Patient will meet greater than or equal to 90% of their needs  MONITOR:   PO intake, Supplement acceptance, Labs, Weight trends  REASON FOR ASSESSMENT:   Consult, Malnutrition Screening Tool Assessment of nutrition requirement/status  ASSESSMENT:   Patient has a history of GERD and Barrett's esophagitis, depression and ADD on medications, dyslipidemia, history of hyperparathyroidism status post parathyroidectomy, prior hospitalization for ileus with small bowel obstruction, and history of left hip fracture with total hip replacement.   Pt seen for consult. Pt states his appetite has been poor since diagnosed with bronchitis 51 days ago. He states that the last 2 weeks his appetite has declined further. Observed tray at bedside, pt consumed all of his fish and 25% of green beans. Pt is lactose intolerant and reports consuming a lactose free diet PTA, including Lactiad milk and ice cream, and soy yogurt. PTA pt was consuming 3 meals daily. He is currently receiving boost breeze BID, and reports enjoying them. Wife states she is going to purchase boost breeze for the pt to consume once D/C. Will continue to provide boost breeze BID to help meet nutritional needs.   Pt states his usual body weight for majority of his adult life was 188-190 lb, once his small bowel obstruction occurred his new normal weight is 158 lb. Pt and wife state he has recently lost 5-6 lb within 3 weeks.  Per chart pt has lost 11 lb (7%) within 3 months, which is not significant for timeframe.    Conducted nutrition focused physical exam, identified no muscle or fat wasting.     Medications reviewed; multivitamin 1 tablet daily. Labs reviewed.   Diet Order:   Diet Heart Room service appropriate?: Yes; Fluid consistency:: Thin  Skin:  Reviewed, no issues  Last BM:  09/04/2015  Height:   Ht Readings from Last 1 Encounters:  09/04/15 6' (1.829 m)    Weight:   Wt Readings from Last 1 Encounters:  09/04/15 151 lb (68.493 kg)    Ideal Body Weight:  80.9 kg  BMI:  Body mass index is 20.47 kg/(m^2).  Estimated Nutritional Needs:   Kcal:  1800-2000  Protein:  80-95  Fluid:  1.8-2 L  EDUCATION NEEDS:   No education needs identified at this time  [place], Dietetic Intern Pager: [phone]

## 2015-09-05 NOTE — Care Management Obs Status (Signed)
[place] NOTIFICATION   Patient Details  Name: [name] MRN: [id number] Date of Birth: 1951-02-25   Medicare Observation Status Notification Given:  Yes    Carles Collet, RN 09/05/2015, 9:31 AM

## 2015-09-05 NOTE — Progress Notes (Signed)
*  PRELIMINARY RESULTS* Echocardiogram 2D Echocardiogram has been performed.  [name] 09/05/2015, 3:32 PM

## 2015-09-05 NOTE — Progress Notes (Signed)
[facility] PROGRESS NOTE  [name] [id number] DOB: 12/29/1950 DOA: 09/04/2015 PCP: Lilian Coma, MD  Length of stay: 1   Assessment/Plan: Principal Problem:   CAP (community acquired pneumonia) Active Problems:   HLD (hyperlipidemia)   Depression   ADD (attention deficit disorder)   G E R D   Hyperparathyroidism, primary, s/p MI parathyroidectomy 7/18, 3.1 gm adenoma   Upper airway cough syndrome   Acute respiratory distress (HCC)   Barrett's esophagus   Dyspnea on exertion   Influenza   Brief summary 65 year old male patient has a history of GERD and Barrett's esophagitis, depression and ADD on medications, dyslipidemia, history of hyperparathyroidism status post parathyroidectomy, prior hospitalization for ileus with small bowel obstruction, and history of left hip fracture with total hip replacement. According to the patient and his wife over the past 52 days he has had issues with chronic paroxysmal coughing that is worse when he lays backwards. At times this is associated with shortness of breath and/or dyspnea on exertion. He's had multiple outpatient evaluations: cardiology, GI and pulmonary medicine. It was felt the patient had reflux mediated upper airway cough syndrome and he has been maintained on breakfast and lunch Prilosec and bedtime Pepcid. About 2 weeks ago he had bronchitis-type symptomatology and was prescribed Bactrim by the pulmonologist. He returned on 2/28 and chest x-ray revealed bilateral upper lobe pneumonia and patient was started on Levaquin on 3/1 of which he took one dose. He reports worsening shortness of breath and dyspnea on exertion and nonproductive cough. He is now having fevers with MAXIMUM TEMPERATURE is up to 102 Patient also found to be positive for influenza A  Assessment and plan  Acute respiratory distress/CAP without sepsis -Presents with progressive shortness of breath worsened baseline and associated fevers and  abnormal chest x-ray concerning for pneumonia Continue telemetry, follow blood culture 2, blood pressure soft, continue when necessary boluses Continue Rocephin azithromycin, Tamiflu -Supportive care with oxygen as needed-check ambulatory pulse oximetry -Check urinary strep and Legionella as well as blood cultures and sputum culture -Influenza PCR and respiratory viral panel **POSITIVE for Flu A so begin Tamiflu Continue Tussionex for cough     Upper airway cough syndrome/G E R D/Barrett's esophagus -Has undergone extensive workup as an outpatient with pulmonology -Chloraseptic Spray, initiated IV steroids -Budesonide nebulizers twice a day to ease paroxysmal coughing likely secondary to chronic recurrent inflammation -Continue PPI and H2 blockers as prior to admission -Reports recent EGD without significant issues -Recent outpatient CT of the chest unremarkable except for several noncalcified lung nodules with recommendations for follow-up CT in 4-6 months (study was ordered by outpatient pulmonologist) Autoimmune workup including rheumatoid arthritis, lupus ( daughter has lupus)   Dyspnea on exertion -Patient reports was evaluated by cardiology and underwent stress test without any significant findings ([place]) -Check 2-D echocardiogram   HLD  -Continue preadmission medication   Depression/ADD  -Continue preadmission medications   Hyperparathyroidism, primary, s/p MI parathyroidectomy 7/18, 3.1 gm adenoma    DVT prophylaxsis Lovenox  Code Status:      Code Status Orders        Start     Ordered   09/04/15 1723  Full code   Continuous     09/04/15 1722     Family Communication: Discussed in detail with the patient, all imaging results, lab results explained to the patient   Disposition Plan:  Anticipate discharge in 2-3 days      Consultants:  None  Procedures:  None  Antibiotics: Anti-infectives    Start     Dose/Rate Route Frequency Ordered  Stop   09/05/15 1300  azithromycin (ZITHROMAX) 500 mg in dextrose 5 % 250 mL IVPB     500 mg 250 mL/hr over 60 Minutes Intravenous Every 24 hours 09/04/15 1722 09/12/15 1259   09/05/15 1300  cefTRIAXone (ROCEPHIN) 1 g in dextrose 5 % 50 mL IVPB     1 g 100 mL/hr over 30 Minutes Intravenous Every 24 hours 09/04/15 1739 09/12/15 1259   09/04/15 2200  oseltamivir (TAMIFLU) capsule 75 mg     75 mg Oral 2 times daily 09/04/15 1719 09/09/15 2159   09/04/15 1515  cefTRIAXone (ROCEPHIN) 1 g in dextrose 5 % 50 mL IVPB  Status:  Discontinued     1 g 100 mL/hr over 30 Minutes Intravenous Every 24 hours 09/04/15 1507 09/04/15 1739   09/04/15 1315  levofloxacin (LEVAQUIN) IVPB 750 mg  Status:  Discontinued     750 mg 100 mL/hr over 90 Minutes Intravenous Every 24 hours 09/04/15 1309 09/04/15 1737         HPI/Subjective: Extremely miserable due to incessant coughing, also complaining of chest pain and back pain  Objective: Filed Vitals:   09/04/15 2009 09/04/15 2103 09/05/15 0545 09/05/15 0913  BP:  87/46 92/56   Pulse:  72 67   Temp:  97.6 F (36.4 C) 98.1 F (36.7 C)   TempSrc:   Oral   Resp:  19 18   Height:      Weight:      SpO2: 99% 99% 98% 98%    Intake/Output Summary (Last 24 hours) at 09/05/15 1207 Last data filed at 09/05/15 0815  Gross per 24 hour  Intake   1220 ml  Output    900 ml  Net    320 ml    Exam:  General: No acute respiratory distress Lungs: Clear to auscultation bilaterally without wheezes or crackles Cardiovascular: Regular rate and rhythm without murmur gallop or rub normal S1 and S2 Abdomen: Nontender, nondistended, soft, bowel sounds positive, no rebound, no ascites, no appreciable mass Extremities: No significant cyanosis, clubbing, or edema bilateral lower extremities     Data Review   Micro Results Recent Results (from the past 240 hour(s))  Culture, blood (Routine X 2) w Reflex to ID Panel     Status: None (Preliminary result)    Collection Time: 09/04/15  3:25 PM  Result Value Ref Range Status   Specimen Description BLOOD LEFT ANTECUBITAL  Final   Special Requests BOTTLES DRAWN AEROBIC AND ANAEROBIC 5CC  Final   Culture NO GROWTH < 24 HOURS  Final   Report Status PENDING  Incomplete  Culture, blood (Routine X 2) w Reflex to ID Panel     Status: None (Preliminary result)   Collection Time: 09/04/15  3:32 PM  Result Value Ref Range Status   Specimen Description BLOOD LEFT HAND  Final   Special Requests BOTTLES DRAWN AEROBIC ONLY [place]  Final   Culture NO GROWTH < 24 HOURS  Final   Report Status PENDING  Incomplete    Radiology Reports Dg Chest 2 View  09/04/2015  CLINICAL DATA:  Cough, shortness of breath for the past 50 days, history of chronic bronchitis previous tobacco use, previous episodes of pneumonia. EXAM: CHEST  2 VIEW COMPARISON:  Chest x-ray of September 02, 2015 an chest x-ray dated May 02, 2015. FINDINGS: There are persistent confluent alveolar infiltrates in the upper  lobes. These are somewhat more conspicuous today especially on the left. Coarse infrahilar density on the right is more conspicuous today. There is no pleural effusion. The heart and pulmonary vascularity are normal. The mediastinum is normal in width. The bony thorax exhibits no acute abnormality. There is an old fracture of the lateral aspect of the left sixth rib. IMPRESSION: Bilateral upper lobe pneumonia more conspicuous on the left today than previously demonstrated. Infrahilar atelectasis or pneumonia on the right. Electronically Signed   By: David  Martinique M.D.   On: 09/04/2015 12:37   Dg Chest 2 View  09/02/2015  CLINICAL DATA:  Shortness of breath, productive cough. EXAM: CHEST  2 VIEW COMPARISON:  August 26, 2015. FINDINGS: Stable cardiomediastinal silhouette. Old distal left clavicular fracture is noted. No pneumothorax or pleural effusion is noted. Significantly increased bilateral upper lobe airspace opacities are noted  concerning for pneumonia or possibly edema. Osteophyte formation is noted in the thoracic spine. IMPRESSION: Significant increased bilateral upper lobe airspace opacities are noted concerning for pneumonia or possibly edema. Follow-up radiographs are recommended. Electronically Signed   By: Marijo Conception, M.D.   On: 09/02/2015 16:51   Dg Chest 2 View  08/27/2015  ADDENDUM REPORT: 08/27/2015 10:03 ADDENDUM: In the conclusion of the report I misspoke and said "left lower lobe atelectasis" when in fact I should have said "right lower lobe atelectasis or early pneumonia". I also misspoke and indicated that "pulmonary nodules demonstrated on the February 27th CT scan.". I should have stated "CT scan of the chest of August 14, 2015.". The remainder of the impression is correct. I apologize for the confusion these errors have has caused. Electronically Signed   By: David  Martinique M.D.   On: 08/27/2015 10:03  08/27/2015  CLINICAL DATA:  One month of cough and chest congestion, history of pneumonia, mitral valve prolapse, former smoker. EXAM: CHEST  2 VIEW COMPARISON:  Chest x-ray of May 02, 2015 and CT scan of the chest of August 14, 2015. FINDINGS: The lungs are well-expanded. The interstitial markings are chronically increased. However, increased interstitial density in the right infrahilar region posteriorly is present today. There is no air bronchogram. There is no pleural effusion or parenchymal mass. The trachea is midline. There is mild multilevel degenerative disc disease of the thoracic spine. IMPRESSION: Left lower lobe atelectasis or early pneumonia superimposed upon findings of chronic bronchitis. Followup PA and lateral chest X-ray is recommended in 3-4 weeks following trial of antibiotic therapy to ensure resolution and exclude underlying malignancy. Pulmonary nodules demonstrated on the February 27 CT scan are not clearly evident on this plain film and the previous recommendation for follow-up  chest CT scanning in 4-6 months is again recommended. Electronically Signed: By: David  Martinique M.D. On: 08/26/2015 15:45   Ct Angio Chest Pe W/cm &/or Wo Cm  08/14/2015  CLINICAL DATA:  Chronic cough, shortness of breath for 5 days, right posterior chest pain EXAM: CT ANGIOGRAPHY CHEST WITH CONTRAST TECHNIQUE: Multidetector CT imaging of the chest was performed using the standard protocol during bolus administration of intravenous contrast. Multiplanar CT image reconstructions and MIPs were obtained to evaluate the vascular anatomy. CONTRAST:  100 cc Isovue 370 COMPARISON:  Chest x-ray of 05/02/2015, and CT chest of 05/15/2009 Creatinine was obtained on site at [facility] at [address]Results: Creatinine 0.9 mg/dL. GFR of 89. FINDINGS: The pulmonary artery are well opacified and there is no evidence of acute pulmonary embolism. The thoracic aorta also opacifies with  no acute abnormality noted. The origins of the great vessels are patent no mediastinal or hilar adenopathy is seen. No significant coronary artery calcifications are noted. There is no evidence of pericardial effusion. There is a moderate amount of epicardial fat present palpable incidental liver cysts are noted in the left lobe. Bilateral simple appearing renal cysts are present. The thyroid gland is unremarkable. On lung window images mild biapical pleural parenchymal scarring is present. A noncalcified nodule is present near the right lung apex posteriorly on image 11 measuring 6 mm, not definitely seen on the prior CT. There is a poorly defined 6 mm noncalcified nodule in the posterior aspect of the superior segment of the right lower lobe on image 31. Also, this nodule is not seen on the prior CT. A 5 mm noncalcified nodule is present in the left lower lobe laterally on image 39. This nodule was present and has not changed significantly compared to the CT from 2010. No additional pulmonary nodule is seen. In view of a few new  lung nodules, a followup CT chest scan in 4-6 months is recommended to assess stability. No parenchymal infiltrate is noted and there is no evidence of pleural effusion. There are degenerative changes throughout the thoracic spine. Review of the MIP images confirms the above findings. IMPRESSION: 1. No evidence of acute pulmonary embolism is seen. 2. There are a few noncalcified lung nodules, not seen on the CT from 2010. Therefore followup CT chest in 4-6 months is recommended to assess stability. Electronically Signed   By: Ivar Drape M.D.   On: 08/14/2015 15:06   [place] Cm  08/29/2015  CLINICAL DATA:  Upper airway cough syndrome EXAM: CT PARANASAL SINUS LIMITED WITHOUT CONTRAST TECHNIQUE: Non-contiguous multidetector CT images of the paranasal sinuses were obtained in a single plane without contrast. COMPARISON:  None. FINDINGS: Mild mucosal thickening in the maxillary sinus bilaterally. Right maxillary sinus clear. Mild mucosal edema in the ethmoid sinuses bilaterally. Prior sinus surgery with partial ethmoidectomy and medial antrectomy bilaterally. The frontal and sphenoid with sinuses are clear. No air-fluid level. Mastoid sinus clear. No significant soft tissue abnormality. IMPRESSION: Mild mucosal edema in the ethmoid sinuses and the maxillary sinuses. Remaining sinuses are clear. Prior sinus surgery. Electronically Signed   By: Franchot Gallo M.D.   On: 08/29/2015 13:07     CBC  Recent Labs Lab 09/04/15 1012 09/05/15 0746  WBC 9.0 7.5  HGB 11.5* 9.8*  HCT 35.3* 30.8*  PLT 450* 390  MCV 92.7 92.8  MCH 30.2 29.5  MCHC 32.6 31.8  RDW 12.9 13.1  LYMPHSABS 0.7 0.8  MONOABS 0.5 0.7  EOSABS 0.0 0.1  BASOSABS 0.0 0.0    Chemistries   Recent Labs Lab 09/04/15 1012 09/05/15 0746  NA 139 138  K 4.0 4.0  CL 100* 106  CO2 25 23  GLUCOSE 104* 90  BUN 12 12  CREATININE 0.91 0.89  CALCIUM 8.9 8.0*  AST  --  50*  ALT  --  78*  ALKPHOS  --  171*  BILITOT  --  0.4    ------------------------------------------------------------------------------------------------------------------ estimated creatinine clearance is 80.2 mL/min (by C-G formula based on Cr of 0.89). ------------------------------------------------------------------------------------------------------------------ No results for input(s): HGBA1C in the last 72 hours. ------------------------------------------------------------------------------------------------------------------ No results for input(s): CHOL, HDL, LDLCALC, TRIG, CHOLHDL, LDLDIRECT in the last 72 hours. ------------------------------------------------------------------------------------------------------------------ No results for input(s): TSH, T4TOTAL, T3FREE, THYROIDAB in the last 72 hours.  Invalid input(s): FREET3 ------------------------------------------------------------------------------------------------------------------ No results for input(s): VITAMINB12,  FOLATE, FERRITIN, TIBC, IRON, RETICCTPCT in the last 72 hours.  Coagulation profile No results for input(s): INR, PROTIME in the last 168 hours.  No results for input(s): DDIMER in the last 72 hours.  Cardiac Enzymes No results for input(s): CKMB, TROPONINI, MYOGLOBIN in the last 168 hours.  Invalid input(s): CK ------------------------------------------------------------------------------------------------------------------ Invalid input(s): POCBNP   CBG:  Recent Labs Lab 09/04/15 2102 09/05/15 0813  GLUCAP 121* 84       Studies: Dg Chest 2 View  09/04/2015  CLINICAL DATA:  Cough, shortness of breath for the past 50 days, history of chronic bronchitis previous tobacco use, previous episodes of pneumonia. EXAM: CHEST  2 VIEW COMPARISON:  Chest x-ray of September 02, 2015 an chest x-ray dated May 02, 2015. FINDINGS: There are persistent confluent alveolar infiltrates in the upper lobes. These are somewhat more conspicuous today especially on  the left. Coarse infrahilar density on the right is more conspicuous today. There is no pleural effusion. The heart and pulmonary vascularity are normal. The mediastinum is normal in width. The bony thorax exhibits no acute abnormality. There is an old fracture of the lateral aspect of the left sixth rib. IMPRESSION: Bilateral upper lobe pneumonia more conspicuous on the left today than previously demonstrated. Infrahilar atelectasis or pneumonia on the right. Electronically Signed   By: David  Martinique M.D.   On: 09/04/2015 12:37      Lab Results  Component Value Date   HGBA1C 6.0* 02/15/2013   Lab Results  Component Value Date   CREATININE 0.89 09/05/2015       Scheduled Meds: . antiseptic oral rinse  7 mL Mouth Rinse BID  . azithromycin  500 mg Intravenous Q24H  . budesonide (PULMICORT) nebulizer solution  0.25 mg Nebulization BID  . buPROPion  200 mg Oral BID  . cefTRIAXone (ROCEPHIN)  IV  1 g Intravenous Q24H  . chlorpheniramine-HYDROcodone  5 mL Oral TID  . enoxaparin (LOVENOX) injection  40 mg Subcutaneous Q24H  . famotidine  20 mg Oral QHS  . feeding supplement  1 Container Oral TID BM  . FLUoxetine  40 mg Oral Daily  . gabapentin  100 mg Oral TID  . methylphenidate  10 mg Oral TID  . methylPREDNISolone (SOLU-MEDROL) injection  60 mg Intravenous Q12H  . multivitamin with minerals  1 tablet Oral Daily  . oseltamivir  75 mg Oral BID  . pantoprazole  40 mg Oral BID AC  . simvastatin  20 mg Oral QHS   Continuous Infusions:   Principal Problem:   CAP (community acquired pneumonia) Active Problems:   HLD (hyperlipidemia)   Depression   ADD (attention deficit disorder)   G E R D   Hyperparathyroidism, primary, s/p MI parathyroidectomy 7/18, 3.1 gm adenoma   Upper airway cough syndrome   Acute respiratory distress (HCC)   Barrett's esophagus   Dyspnea on exertion   Influenza    Time spent: 45 minutes   [facility] Pager [phone]. If  7PM-7AM, please contact night-coverage at [email or web address], password [email or web address] 09/05/2015, 12:07 PM  LOS: 1 day

## 2015-09-05 NOTE — Care Management Note (Signed)
Case Management Note  Patient Details  Name: [name] MRN: [id number] Date of Birth: Oct 25, 1950  Subjective/Objective:                 Patient in observation for PNA and Flu. Lives at home with wife.    Action/Plan:  Cm will continue to follow for DC needs.  Expected Discharge Date:                  Expected Discharge Plan:  Home/Self Care  In-House Referral:     Discharge planning Services  CM Consult  Post Acute Care Choice:    Choice offered to:     DME Arranged:    DME Agency:     HH Arranged:    HH Agency:     Status of Service:  In process, will continue to follow  Medicare Important Message Given:    Date Medicare IM Given:    Medicare IM give by:    Date Additional Medicare IM Given:    Additional Medicare Important Message give by:     If discussed at [place] of Stay Meetings, dates discussed:    Additional Comments:  Carles Collet, RN 09/05/2015, 3:10 PM

## 2015-09-06 DIAGNOSIS — J189 Pneumonia, unspecified organism: Secondary | ICD-10-CM

## 2015-09-06 DIAGNOSIS — R059 Cough, unspecified: Secondary | ICD-10-CM | POA: Insufficient documentation

## 2015-09-06 DIAGNOSIS — K219 Gastro-esophageal reflux disease without esophagitis: Secondary | ICD-10-CM

## 2015-09-06 DIAGNOSIS — R05 Cough: Secondary | ICD-10-CM | POA: Insufficient documentation

## 2015-09-06 LAB — COMPREHENSIVE METABOLIC PANEL
ALT: 75 U/L — ABNORMAL HIGH (ref 17–63)
ANION GAP: 10 (ref 5–15)
AST: 41 U/L (ref 15–41)
Albumin: 2.1 g/dL — ABNORMAL LOW (ref 3.5–5.0)
Alkaline Phosphatase: 161 U/L — ABNORMAL HIGH (ref 38–126)
BILIRUBIN TOTAL: 0.4 mg/dL (ref 0.3–1.2)
BUN: 15 mg/dL (ref 6–20)
CO2: 23 mmol/L (ref 22–32)
Calcium: 8.6 mg/dL — ABNORMAL LOW (ref 8.9–10.3)
Chloride: 102 mmol/L (ref 101–111)
Creatinine, Ser: 0.7 mg/dL (ref 0.61–1.24)
Glucose, Bld: 137 mg/dL — ABNORMAL HIGH (ref 65–99)
POTASSIUM: 4.2 mmol/L (ref 3.5–5.1)
Sodium: 135 mmol/L (ref 135–145)
TOTAL PROTEIN: 5.6 g/dL — AB (ref 6.5–8.1)

## 2015-09-06 LAB — CBC
HEMATOCRIT: 32.5 % — AB (ref 39.0–52.0)
Hemoglobin: 10.3 g/dL — ABNORMAL LOW (ref 13.0–17.0)
MCH: 29.2 pg (ref 26.0–34.0)
MCHC: 31.7 g/dL (ref 30.0–36.0)
MCV: 92.1 fL (ref 78.0–100.0)
PLATELETS: 450 10*3/uL — AB (ref 150–400)
RBC: 3.53 MIL/uL — ABNORMAL LOW (ref 4.22–5.81)
RDW: 12.8 % (ref 11.5–15.5)
WBC: 9.4 10*3/uL (ref 4.0–10.5)

## 2015-09-06 LAB — TROPONIN I: Troponin I: <0.03 ng/mL (ref <0.031)

## 2015-09-06 LAB — ANTI-DNA ANTIBODY, DOUBLE-STRANDED

## 2015-09-06 LAB — RHEUMATOID FACTOR: Rhuematoid fact SerPl-aCnc: 36.8 IU/mL — ABNORMAL HIGH (ref 0.0–13.9)

## 2015-09-06 LAB — ANTI-SCLERODERMA ANTIBODY: Scleroderma (Scl-70) (ENA) Antibody, IgG: <0.2 AI (ref 0.0–0.9)

## 2015-09-06 MED ORDER — METHYLPREDNISOLONE SODIUM SUCC 125 MG IJ SOLR
60.0000 mg | Freq: Four times a day (QID) | INTRAMUSCULAR | Status: DC
  Administered 2015-09-06 – 2015-09-08 (×9): 60 mg via INTRAVENOUS
  Filled 2015-09-06 (×9): qty 2

## 2015-09-06 MED ORDER — FUROSEMIDE 10 MG/ML IJ SOLN
40.0000 mg | Freq: Once | INTRAMUSCULAR | Status: AC
  Administered 2015-09-06: 40 mg via INTRAVENOUS
  Filled 2015-09-06: qty 4

## 2015-09-06 NOTE — Progress Notes (Signed)
[facility] PROGRESS NOTE  ASBURY DRIESENGA [id number] DOB: 09/06/1950 DOA: 09/04/2015 PCP: Lilian Coma, MD  Length of stay: 2   Assessment/Plan: Principal Problem:   CAP (community acquired pneumonia) Active Problems:   HLD (hyperlipidemia)   Depression   ADD (attention deficit disorder)   G E R D   Hyperparathyroidism, primary, s/p MI parathyroidectomy 7/18, 3.1 gm adenoma   Upper airway cough syndrome   Acute respiratory distress (HCC)   Barrett's esophagus   Dyspnea on exertion   Influenza   Brief summary 65 year old male patient has a history of GERD and Barrett's esophagitis, depression and ADD on medications, dyslipidemia, history of hyperparathyroidism status post parathyroidectomy, prior hospitalization for ileus with small bowel obstruction, and history of left hip fracture with total hip replacement. According to the patient and his wife over the past 52 days he has had issues with chronic paroxysmal coughing that is worse when he lays backwards. At times this is associated with shortness of breath and/or dyspnea on exertion. He's had multiple outpatient evaluations: cardiology, GI and pulmonary medicine. It was felt the patient had reflux mediated upper airway cough syndrome and he has been maintained on breakfast and lunch Prilosec and bedtime Pepcid. About 2 weeks ago he had bronchitis-type symptomatology and was prescribed Bactrim by the pulmonologist. He returned on 2/28 and chest x-ray revealed bilateral upper lobe pneumonia and patient was started on Levaquin on 3/1 of which he took one dose. He reports worsening shortness of breath and dyspnea on exertion and nonproductive cough. He is now having fevers with MAXIMUM TEMPERATURE is up to 102 Patient also found to be positive for influenza A   Assessment and plan Acute respiratory distress/CAP without sepsis -Presents with progressive shortness of breath worsened baseline and associated fevers and  abnormal chest x-ray concerning for pneumonia Continue telemetry, follow blood culture 2, blood pressure soft, continue when necessary boluses Continue Rocephin azithromycin, Tamiflu -Supportive care with oxygen as needed-check ambulatory pulse oximetry Negative urinary strep and Legionella , blood culture no growth so far -Influenza PCR and respiratory viral panel **POSITIVE for Flu A so begin Tamiflu No improvement despite multiple medications including steroids, pulmonary consult requested to be evaluated for rheumatoid lung disease We'll give 1 dose of Lasix IV today to see if his cough gets better     Upper airway cough syndrome/G E R D/Barrett's esophagus -Has undergone extensive workup as an outpatient with pulmonology -Chloraseptic Spray, cont IV steroids, increase dose  -Budesonide nebulizers twice a day to ease paroxysmal coughing likely secondary to chronic recurrent inflammation -Continue PPI and H2 blockers as prior to admission -Reports recent EGD without significant issues -Recent outpatient CT of the chest unremarkable except for several noncalcified lung nodules with recommendations for follow-up CT in 4-6 months (study was ordered by outpatient pulmonologist) Autoimmune workup including rheumatoid arthritis, lupus ( daughter has lupus), positive rheumatoid factor   Dyspnea on exertion -Patient reports was evaluated by cardiology and underwent stress test without any significant findings () 2-D echo shows an EF of 55-60% without any regional wall motion abnormalities   HLD  -Continue preadmission medication   Depression/ADD  -Continue preadmission medications   Hyperparathyroidism, primary, s/p MI parathyroidectomy 7/18, 3.1 gm adenoma    DVT prophylaxsis Lovenox  Code Status:      Code Status Orders        Start     Ordered   09/04/15 1723  Full code   Continuous  09/04/15 1722     Family Communication: Discussed in detail with the  patient, all imaging results, lab results explained to the patient   Disposition Plan:  Anticipate discharge in 2-3 days      Consultants:  Pulmonary  Procedures:  None  Antibiotics: Anti-infectives    Start     Dose/Rate Route Frequency Ordered Stop   09/05/15 1300  azithromycin (ZITHROMAX) 500 mg in dextrose 5 % 250 mL IVPB     500 mg 250 mL/hr over 60 Minutes Intravenous Every 24 hours 09/04/15 1722 09/12/15 1259   09/05/15 1300  cefTRIAXone (ROCEPHIN) 1 g in dextrose 5 % 50 mL IVPB     1 g 100 mL/hr over 30 Minutes Intravenous Every 24 hours 09/04/15 1739 09/12/15 1259   09/04/15 2200  oseltamivir (TAMIFLU) capsule 75 mg     75 mg Oral 2 times daily 09/04/15 1719 09/09/15 2159   09/04/15 1515  cefTRIAXone (ROCEPHIN) 1 g in dextrose 5 % 50 mL IVPB  Status:  Discontinued     1 g 100 mL/hr over 30 Minutes Intravenous Every 24 hours 09/04/15 1507 09/04/15 1739   09/04/15 1315  levofloxacin (LEVAQUIN) IVPB 750 mg  Status:  Discontinued     750 mg 100 mL/hr over 90 Minutes Intravenous Every 24 hours 09/04/15 1309 09/04/15 1737         HPI/Subjective: Patient depressed and unhappy about continued coughing spells  Objective: Filed Vitals:   09/05/15 1609 09/05/15 2118 09/05/15 2152 09/06/15 0614  BP: 115/64  105/49 110/81  Pulse: 75  71 73  Temp: 97.5 F (36.4 C)  98.1 F (36.7 C) 97.4 F (36.3 C)  TempSrc:   Oral Oral  Resp: 19  16 16   Height:      Weight:      SpO2: 100% 95% 95% 99%    Intake/Output Summary (Last 24 hours) at 09/06/15 1127 Last data filed at 09/06/15 0825  Gross per 24 hour  Intake      0 ml  Output   2080 ml  Net  -2080 ml    Exam:  General: No acute respiratory distress Lungs: Clear to auscultation bilaterally without wheezes or crackles Cardiovascular: Regular rate and rhythm without murmur gallop or rub normal S1 and S2 Abdomen: Nontender, nondistended, soft, bowel sounds positive, no rebound, no ascites, no appreciable  mass Extremities: No significant cyanosis, clubbing, or edema bilateral lower extremities     Data Review   Micro Results Recent Results (from the past 240 hour(s))  Respiratory virus panel     Status: Abnormal   Collection Time: 09/04/15  1:53 PM  Result Value Ref Range Status   Source - RVPAN NASOPHARYNGEAL  Corrected   Respiratory Syncytial Virus A Negative Negative Final   Respiratory Syncytial Virus B Negative Negative Final   Influenza A Positive (A) Negative Final    Comment: Subtype: H3   Influenza B Negative Negative Final   Parainfluenza 1 Negative Negative Final   Parainfluenza 2 Negative Negative Final   Parainfluenza 3 Negative Negative Final   Metapneumovirus Negative Negative Final   Rhinovirus Negative Negative Final   Adenovirus Negative Negative Final    Comment: (NOTE) Performed At: [facility] [address] [place], [place] [id number] Lindon Romp MD [id number]   Culture, blood (Routine X 2) w Reflex to ID Panel     Status: None (Preliminary result)   Collection Time: 09/04/15  3:25 PM  Result Value Ref Range Status  Specimen Description BLOOD LEFT ANTECUBITAL  Final   Special Requests BOTTLES DRAWN AEROBIC AND ANAEROBIC 5CC  Final   Culture NO GROWTH < 24 HOURS  Final   Report Status PENDING  Incomplete  Culture, blood (Routine X 2) w Reflex to ID Panel     Status: None (Preliminary result)   Collection Time: 09/04/15  3:32 PM  Result Value Ref Range Status   Specimen Description BLOOD LEFT HAND  Final   Special Requests BOTTLES DRAWN AEROBIC ONLY [place]  Final   Culture NO GROWTH < 24 HOURS  Final   Report Status PENDING  Incomplete  Culture, sputum-assessment     Status: None   Collection Time: 09/05/15  7:38 PM  Result Value Ref Range Status   Specimen Description SPUTUM  Final   Special Requests NONE  Final   Sputum evaluation   Final    MICROSCOPIC FINDINGS SUGGEST THAT THIS SPECIMEN IS NOT REPRESENTATIVE OF LOWER RESPIRATORY  SECRETIONS. PLEASE RECOLLECT. Louis Matte RN [id number] 09/05/15 A BROWNING    Report Status 09/05/2015 FINAL  Final    Radiology Reports Dg Chest 2 View  09/04/2015  CLINICAL DATA:  Cough, shortness of breath for the past 50 days, history of chronic bronchitis previous tobacco use, previous episodes of pneumonia. EXAM: CHEST  2 VIEW COMPARISON:  Chest x-ray of September 02, 2015 an chest x-ray dated May 02, 2015. FINDINGS: There are persistent confluent alveolar infiltrates in the upper lobes. These are somewhat more conspicuous today especially on the left. Coarse infrahilar density on the right is more conspicuous today. There is no pleural effusion. The heart and pulmonary vascularity are normal. The mediastinum is normal in width. The bony thorax exhibits no acute abnormality. There is an old fracture of the lateral aspect of the left sixth rib. IMPRESSION: Bilateral upper lobe pneumonia more conspicuous on the left today than previously demonstrated. Infrahilar atelectasis or pneumonia on the right. Electronically Signed   By: David  Martinique M.D.   On: 09/04/2015 12:37   Dg Chest 2 View  09/02/2015  CLINICAL DATA:  Shortness of breath, productive cough. EXAM: CHEST  2 VIEW COMPARISON:  August 26, 2015. FINDINGS: Stable cardiomediastinal silhouette. Old distal left clavicular fracture is noted. No pneumothorax or pleural effusion is noted. Significantly increased bilateral upper lobe airspace opacities are noted concerning for pneumonia or possibly edema. Osteophyte formation is noted in the thoracic spine. IMPRESSION: Significant increased bilateral upper lobe airspace opacities are noted concerning for pneumonia or possibly edema. Follow-up radiographs are recommended. Electronically Signed   By: Marijo Conception, M.D.   On: 09/02/2015 16:51   Dg Chest 2 View  08/27/2015  ADDENDUM REPORT: 08/27/2015 10:03 ADDENDUM: In the conclusion of the report I misspoke and said "left lower lobe  atelectasis" when in fact I should have said "right lower lobe atelectasis or early pneumonia". I also misspoke and indicated that "pulmonary nodules demonstrated on the February 27th CT scan.". I should have stated "CT scan of the chest of August 14, 2015.". The remainder of the impression is correct. I apologize for the confusion these errors have has caused. Electronically Signed   By: David  Martinique M.D.   On: 08/27/2015 10:03  08/27/2015  CLINICAL DATA:  One month of cough and chest congestion, history of pneumonia, mitral valve prolapse, former smoker. EXAM: CHEST  2 VIEW COMPARISON:  Chest x-ray of May 02, 2015 and CT scan of the chest of August 14, 2015. FINDINGS: The lungs are well-expanded.  The interstitial markings are chronically increased. However, increased interstitial density in the right infrahilar region posteriorly is present today. There is no air bronchogram. There is no pleural effusion or parenchymal mass. The trachea is midline. There is mild multilevel degenerative disc disease of the thoracic spine. IMPRESSION: Left lower lobe atelectasis or early pneumonia superimposed upon findings of chronic bronchitis. Followup PA and lateral chest X-ray is recommended in 3-4 weeks following trial of antibiotic therapy to ensure resolution and exclude underlying malignancy. Pulmonary nodules demonstrated on the February 27 CT scan are not clearly evident on this plain film and the previous recommendation for follow-up chest CT scanning in 4-6 months is again recommended. Electronically Signed: By: David  Martinique M.D. On: 08/26/2015 15:45   Ct Angio Chest Pe W/cm &/or Wo Cm  08/14/2015  CLINICAL DATA:  Chronic cough, shortness of breath for 5 days, right posterior chest pain EXAM: CT ANGIOGRAPHY CHEST WITH CONTRAST TECHNIQUE: Multidetector CT imaging of the chest was performed using the standard protocol during bolus administration of intravenous contrast. Multiplanar CT image reconstructions and  MIPs were obtained to evaluate the vascular anatomy. CONTRAST:  100 cc Isovue 370 COMPARISON:  Chest x-ray of 05/02/2015, and CT chest of 05/15/2009 Creatinine was obtained on site at [facility] at [address]Results: Creatinine 0.9 mg/dL. GFR of 89. FINDINGS: The pulmonary artery are well opacified and there is no evidence of acute pulmonary embolism. The thoracic aorta also opacifies with no acute abnormality noted. The origins of the great vessels are patent no mediastinal or hilar adenopathy is seen. No significant coronary artery calcifications are noted. There is no evidence of pericardial effusion. There is a moderate amount of epicardial fat present palpable incidental liver cysts are noted in the left lobe. Bilateral simple appearing renal cysts are present. The thyroid gland is unremarkable. On lung window images mild biapical pleural parenchymal scarring is present. A noncalcified nodule is present near the right lung apex posteriorly on image 11 measuring 6 mm, not definitely seen on the prior CT. There is a poorly defined 6 mm noncalcified nodule in the posterior aspect of the superior segment of the right lower lobe on image 31. Also, this nodule is not seen on the prior CT. A 5 mm noncalcified nodule is present in the left lower lobe laterally on image 39. This nodule was present and has not changed significantly compared to the CT from 2010. No additional pulmonary nodule is seen. In view of a few new lung nodules, a followup CT chest scan in 4-6 months is recommended to assess stability. No parenchymal infiltrate is noted and there is no evidence of pleural effusion. There are degenerative changes throughout the thoracic spine. Review of the MIP images confirms the above findings. IMPRESSION: 1. No evidence of acute pulmonary embolism is seen. 2. There are a few noncalcified lung nodules, not seen on the CT from 2010. Therefore followup CT chest in 4-6 months is recommended to  assess stability. Electronically Signed   By: Ivar Drape M.D.   On: 08/14/2015 15:06   [place] Cm  08/29/2015  CLINICAL DATA:  Upper airway cough syndrome EXAM: CT PARANASAL SINUS LIMITED WITHOUT CONTRAST TECHNIQUE: Non-contiguous multidetector CT images of the paranasal sinuses were obtained in a single plane without contrast. COMPARISON:  None. FINDINGS: Mild mucosal thickening in the maxillary sinus bilaterally. Right maxillary sinus clear. Mild mucosal edema in the ethmoid sinuses bilaterally. Prior sinus surgery with partial ethmoidectomy and medial antrectomy bilaterally.  The frontal and sphenoid with sinuses are clear. No air-fluid level. Mastoid sinus clear. No significant soft tissue abnormality. IMPRESSION: Mild mucosal edema in the ethmoid sinuses and the maxillary sinuses. Remaining sinuses are clear. Prior sinus surgery. Electronically Signed   By: Franchot Gallo M.D.   On: 08/29/2015 13:07     CBC  Recent Labs Lab 09/04/15 1012 09/05/15 0746 09/06/15 0625  WBC 9.0 7.5 9.4  HGB 11.5* 9.8* 10.3*  HCT 35.3* 30.8* 32.5*  PLT 450* 390 450*  MCV 92.7 92.8 92.1  MCH 30.2 29.5 29.2  MCHC 32.6 31.8 31.7  RDW 12.9 13.1 12.8  LYMPHSABS 0.7 0.8  --   MONOABS 0.5 0.7  --   EOSABS 0.0 0.1  --   BASOSABS 0.0 0.0  --     Chemistries   Recent Labs Lab 09/04/15 1012 09/05/15 0746 09/06/15 0625  NA 139 138 135  K 4.0 4.0 4.2  CL 100* 106 102  CO2 25 23 23   GLUCOSE 104* 90 137*  BUN 12 12 15   CREATININE 0.91 0.89 0.70  CALCIUM 8.9 8.0* 8.6*  AST  --  50* 41  ALT  --  78* 75*  ALKPHOS  --  171* 161*  BILITOT  --  0.4 0.4   ------------------------------------------------------------------------------------------------------------------ estimated creatinine clearance is 89.2 mL/min (by C-G formula based on Cr of 0.7). ------------------------------------------------------------------------------------------------------------------ No results for input(s):  HGBA1C in the last 72 hours. ------------------------------------------------------------------------------------------------------------------ No results for input(s): CHOL, HDL, LDLCALC, TRIG, CHOLHDL, LDLDIRECT in the last 72 hours. ------------------------------------------------------------------------------------------------------------------ No results for input(s): TSH, T4TOTAL, T3FREE, THYROIDAB in the last 72 hours.  Invalid input(s): FREET3 ------------------------------------------------------------------------------------------------------------------ No results for input(s): VITAMINB12, FOLATE, FERRITIN, TIBC, IRON, RETICCTPCT in the last 72 hours.  Coagulation profile No results for input(s): INR, PROTIME in the last 168 hours.  No results for input(s): DDIMER in the last 72 hours.  Cardiac Enzymes  Recent Labs Lab 09/05/15 1213 09/06/15 0015  TROPONINI <0.03 <0.03   ------------------------------------------------------------------------------------------------------------------ Invalid input(s): POCBNP   CBG:  Recent Labs Lab 09/04/15 2102 09/05/15 0813 09/05/15 1231  GLUCAP 121* 84 102*       Studies: Dg Chest 2 View  09/04/2015  CLINICAL DATA:  Cough, shortness of breath for the past 50 days, history of chronic bronchitis previous tobacco use, previous episodes of pneumonia. EXAM: CHEST  2 VIEW COMPARISON:  Chest x-ray of September 02, 2015 an chest x-ray dated May 02, 2015. FINDINGS: There are persistent confluent alveolar infiltrates in the upper lobes. These are somewhat more conspicuous today especially on the left. Coarse infrahilar density on the right is more conspicuous today. There is no pleural effusion. The heart and pulmonary vascularity are normal. The mediastinum is normal in width. The bony thorax exhibits no acute abnormality. There is an old fracture of the lateral aspect of the left sixth rib. IMPRESSION: Bilateral upper lobe pneumonia  more conspicuous on the left today than previously demonstrated. Infrahilar atelectasis or pneumonia on the right. Electronically Signed   By: David  Martinique M.D.   On: 09/04/2015 12:37      Lab Results  Component Value Date   HGBA1C 6.0* 02/15/2013   Lab Results  Component Value Date   CREATININE 0.70 09/06/2015       Scheduled Meds: . antiseptic oral rinse  7 mL Mouth Rinse BID  . azithromycin  500 mg Intravenous Q24H  . budesonide (PULMICORT) nebulizer solution  0.25 mg Nebulization BID  . buPROPion  200 mg Oral BID  .  cefTRIAXone (ROCEPHIN)  IV  1 g Intravenous Q24H  . chlorpheniramine-HYDROcodone  5 mL Oral TID  . enoxaparin (LOVENOX) injection  40 mg Subcutaneous Q24H  . famotidine  20 mg Oral QHS  . feeding supplement  1 Container Oral TID BM  . FLUoxetine  40 mg Oral Daily  . gabapentin  100 mg Oral TID  . methylphenidate  10 mg Oral TID  . methylPREDNISolone (SOLU-MEDROL) injection  60 mg Intravenous Q12H  . multivitamin with minerals  1 tablet Oral Daily  . oseltamivir  75 mg Oral BID  . pantoprazole  40 mg Oral BID AC  . simvastatin  20 mg Oral QHS   Continuous Infusions:   Principal Problem:   CAP (community acquired pneumonia) Active Problems:   HLD (hyperlipidemia)   Depression   ADD (attention deficit disorder)   G E R D   Hyperparathyroidism, primary, s/p MI parathyroidectomy 7/18, 3.1 gm adenoma   Upper airway cough syndrome   Acute respiratory distress (HCC)   Barrett's esophagus   Dyspnea on exertion   Influenza    Time spent: 45 minutes   [facility] Pager [phone]. If 7PM-7AM, please contact night-coverage at [email or web address], password [facility] 09/06/2015, 11:27 AM  LOS: 2 days

## 2015-09-06 NOTE — Consult Note (Signed)
Name: TAVARIS EUDY MRN: [id number] DOB: [birth year]    ADMISSION DATE:  09/04/2015 CONSULTATION DATE:  3/4  REFERRING MD :  abrol ([facility])   CHIEF COMPLAINT:  Cough   BRIEF PATIENT DESCRIPTION: 65yo male with hx mitral valve prolapse, GERD.barretts esophagus, hiatal hernia, upper airway cough syndrome followed by Dr. Melvyn Novas thought r/t GERD.  Previous w/u in 2009 by Dr. [name] with normal PFT's.  Presented 3/2 with fever, worsening cough and dyspnea.  Pt was POS for Flu A. Admitted by [facility] with bilat upper lobe PNA and treated with IV rocephin, azithro and tamiflu.  PCCM consulted 3/4 for ongoing dyspnea and cough and newly POS RF.    SIGNIFICANT EVENTS    STUDIES:     HISTORY OF PRESENT ILLNESS:  65yo male with hx mitral valve prolapse, GERD, hiatal hernia, upper airway cough syndrome followed by Dr. Melvyn Novas thought r/t GERD.  Previous w/u in 2009 by Dr. [name] with normal PFT's.  Presented 3/2 with fever, worsening cough and dyspnea.  Pt was POS for Flu A. Admitted by [facility] with bilat upper lobe PNA and treated with IV rocephin, azithro and tamiflu.  PCCM consulted 3/4 for ongoing dyspnea and cough.    Daughter has SLE.  Pt denies joint pain/swelling, rash.  Quit smoking 1989.     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  has past surgical history that includes Cholecystectomy (2002); Mastoidectomy (1996); Appendectomy (1984); Cataract extraction; Parathyroidectomy; Nasal septum surgery; Total hip revision (06/14/2011); and Joint replacement. Prior to Admission medications   Medication Sig Start Date End Date Taking? Authorizing Provider  ALPRAZolam Duanne Moron) 0.5 MG tablet Take 1 tablet (0.5 mg total) by mouth at bedtime as needed for anxiety. 08/26/15  Yes  Tanda Rockers, MD  buPROPion [facility] SR) 200 MG 12 hr tablet Take 200 mg by mouth 2 (two) times daily.   Yes Historical Provider, MD  Coenzyme Q10 (COQ-10) 200 MG CAPS Take 1 capsule by mouth daily.   Yes Historical Provider, MD  [place] 2 each by mouth daily.   Yes Historical Provider, MD  FLUoxetine (PROZAC) 40 MG capsule Take 40 mg by mouth daily.     Yes Historical Provider, MD  gabapentin (NEURONTIN) 100 MG capsule Take 1 capsule (100 mg total) by mouth 3 (three) times daily. One three times daily 09/02/15  Yes Tanda Rockers, MD  levofloxacin (LEVAQUIN) 500 MG tablet Take 1 tablet (500 mg total) by mouth daily. 09/03/15  Yes Tanda Rockers, MD  methylphenidate (RITALIN) 10 MG tablet Take 10 mg by mouth 3 (three) times daily.     Yes Historical Provider, MD  Multiple Vitamin (MULTIVITAMIN WITH MINERALS) TABS tablet Take 1 tablet by mouth daily.   Yes Historical Provider, MD  omeprazole (PRILOSEC) 20 MG capsule Take 40 mg by mouth 2 (two) times daily before a meal.    Yes Historical Provider, MD  Probiotic Product (PROBIOTIC DAILY) CAPS Take 1 capsule by mouth daily. Reported on 08/21/2015   Yes Historical Provider, MD  Respiratory Therapy Supplies (FLUTTER) DEVI Use as directed 08/21/15  Yes Tanda Rockers, MD  simvastatin (ZOCOR) 20 MG tablet Take 20 mg by mouth at bedtime.     Yes Historical Provider, MD  albuterol (PROAIR HFA) 108 (90 Base) MCG/ACT  inhaler Inhale 2 puffs into the lungs every 6 (six) hours as needed for wheezing or shortness of breath. Reported on 09/02/2015    Historical Provider, MD   Allergies  Allergen Reactions  . Ciprofloxacin Hcl Other (See Comments)    Burning in ear when drops were used  . Asa [Aspirin]     FAMILY HISTORY:  family history includes Cancer in his brother; Heart Problems in his father and mother; Lung cancer (age of onset: 3) in his father. SOCIAL HISTORY:  reports that he quit smoking about 28 years ago. His smoking use  included Cigarettes. He has a 36 pack-year smoking history. He has quit using smokeless tobacco. He reports that he drinks about 0.6 oz of alcohol per week. He reports that he uses illicit drugs (Marijuana).  REVIEW OF SYSTEMS:   As per HPI - All other systems reviewed and were neg.    SUBJECTIVE:   VITAL SIGNS: Temp:  [97.4 F (36.3 C)-98.1 F (36.7 C)] 97.7 F (36.5 C) (03/04 1431) Pulse Rate:  [71-77] 77 (03/04 1431) Resp:  [16-24] 24 (03/04 1431) BP: (92-115)/(49-81) 92/53 mmHg (03/04 1431) SpO2:  [95 %-100 %] 100 % (03/04 1431)  PHYSICAL EXAMINATION: General:  Chronically ill appearing male, NAD  Neuro:  Awake, alert, appropriate, MAE  HEENT:  Mm moist, no JVD  Cardiovascular:  s1s2  Lungs:  resps even non labored on [place], few scattered rhonchi L>R  Abdomen:  Round, soft, +BS  Musculoskeletal:  Warm and dry, no edema   Recent Labs Lab 09/04/15 1012 09/05/15 0746 09/06/15 0625  NA 139 138 135  K 4.0 4.0 4.2  CL 100* 106 102  CO2 '25 23 23  ' BUN '12 12 15  ' CREATININE 0.91 0.89 0.70  GLUCOSE 104* 90 137*    Recent Labs Lab 09/04/15 1012 09/05/15 0746 09/06/15 0625  HGB 11.5* 9.8* 10.3*  HCT 35.3* 30.8* 32.5*  WBC 9.0 7.5 9.4  PLT 450* 390 450*   No results found.  ASSESSMENT / PLAN:  CAP Upper airway cough syndrome -- ongoing issue at baseline.  Followed closely by pulm and GI, recent EGD wnl Flu A GERD  RF = 36.8 Dsdna = neg Anti-scl-70 = neg  PLAN -  Cont abx per primary - rocephin, Azithro  Cont tamiflu  Max PPI, H2 as per home rx  Check ESR  Continue IV steroids for now Cough suppression  F/u CCP - pending    Nickolas Madrid, NP 09/06/2015  3:12 PM Pager: [phone] or [phone]

## 2015-09-07 LAB — CBC
HEMATOCRIT: 33.4 % — AB (ref 39.0–52.0)
Hemoglobin: 11.2 g/dL — ABNORMAL LOW (ref 13.0–17.0)
MCH: 30.9 pg (ref 26.0–34.0)
MCHC: 33.5 g/dL (ref 30.0–36.0)
MCV: 92 fL (ref 78.0–100.0)
Platelets: 514 10*3/uL — ABNORMAL HIGH (ref 150–400)
RBC: 3.63 MIL/uL — ABNORMAL LOW (ref 4.22–5.81)
RDW: 12.7 % (ref 11.5–15.5)
WBC: 15.3 10*3/uL — AB (ref 4.0–10.5)

## 2015-09-07 LAB — COMPREHENSIVE METABOLIC PANEL
ALT: 82 U/L — ABNORMAL HIGH (ref 17–63)
AST: 45 U/L — AB (ref 15–41)
Albumin: 2.1 g/dL — ABNORMAL LOW (ref 3.5–5.0)
Alkaline Phosphatase: 151 U/L — ABNORMAL HIGH (ref 38–126)
Anion gap: 11 (ref 5–15)
BILIRUBIN TOTAL: 0.4 mg/dL (ref 0.3–1.2)
BUN: 22 mg/dL — AB (ref 6–20)
CALCIUM: 9 mg/dL (ref 8.9–10.3)
CO2: 30 mmol/L (ref 22–32)
Chloride: 99 mmol/L — ABNORMAL LOW (ref 101–111)
Creatinine, Ser: 0.82 mg/dL (ref 0.61–1.24)
GFR calc Af Amer: >60 mL/min (ref >60)
Glucose, Bld: 133 mg/dL — ABNORMAL HIGH (ref 65–99)
POTASSIUM: 4 mmol/L (ref 3.5–5.1)
Sodium: 140 mmol/L (ref 135–145)
TOTAL PROTEIN: 5.3 g/dL — AB (ref 6.5–8.1)

## 2015-09-07 LAB — CYCLIC CITRUL PEPTIDE ANTIBODY, IGG/IGA: CCP Antibodies IgG/IgA: 3 units (ref 0–19)

## 2015-09-07 MED ORDER — POLYETHYLENE GLYCOL 3350 17 G PO PACK
17.0000 g | PACK | Freq: Two times a day (BID) | ORAL | Status: DC
  Administered 2015-09-07 – 2015-09-09 (×5): 17 g via ORAL
  Filled 2015-09-07 (×4): qty 1

## 2015-09-07 NOTE — Progress Notes (Signed)
Physical Therapy Treatment Note  Clinical Impression: SATURATION QUALIFICATIONS: (This note is used to comply with regulatory documentation for home oxygen)  Patient Saturations on Room Air at Rest = 91%  Patient Saturations on Room Air while Ambulating = 87%  Patient Saturations on 2 Liters of oxygen while Ambulating = 92%  Please briefly explain why patient needs home oxygen: Patient requires supplemental oxygen to maintain oxygen saturations at acceptable, safe levels with physical activity.  Roney Marion, [place]  Acute Rehabilitation Services Pager [phone] Office [phone]

## 2015-09-07 NOTE — Progress Notes (Signed)
Went in to give pt his iv abx and steroids. Iv site leaked upon flushing. Iv site removed. Pt states "I'm a difficult stick and would like iv team to restart my iv". IV team was paged

## 2015-09-07 NOTE — Progress Notes (Signed)
[facility] PROGRESS NOTE  [name] [id number] DOB: October 07, 1950 DOA: 09/04/2015 PCP: Lilian Coma, MD  Length of stay: 3   Assessment/Plan: Principal Problem:   CAP (community acquired pneumonia) Active Problems:   HLD (hyperlipidemia)   Depression   ADD (attention deficit disorder)   G E R D   Hyperparathyroidism, primary, s/p MI parathyroidectomy 7/18, 3.1 gm adenoma   Upper airway cough syndrome   Acute respiratory distress (HCC)   Barrett's esophagus   Dyspnea on exertion   Influenza   Cough    Brief summary 65 year old male patient has a history of GERD and Barrett's esophagitis, depression and ADD on medications, dyslipidemia, history of hyperparathyroidism status post parathyroidectomy, prior hospitalization for ileus with small bowel obstruction, and history of left hip fracture with total hip replacement. According to the patient and his wife over the past 52 days he has had issues with chronic paroxysmal coughing that is worse when he lays backwards. At times this is associated with shortness of breath and/or dyspnea on exertion. He's had multiple outpatient evaluations: cardiology, GI and pulmonary medicine. It was felt the patient had reflux mediated upper airway cough syndrome and he has been maintained on breakfast and lunch Prilosec and bedtime Pepcid. About 2 weeks ago he had bronchitis-type symptomatology and was prescribed Bactrim by the pulmonologist. He returned on 2/28 and chest x-ray revealed bilateral upper lobe pneumonia and patient was started on Levaquin on 3/1 of which he took one dose. He reports worsening shortness of breath and dyspnea on exertion and nonproductive cough. He is now having fevers with MAXIMUM TEMPERATURE is up to 102 Patient also found to be positive for influenza A   Assessment and plan Acute respiratory distress/CAP without sepsis -Presents with progressive shortness of breath worsened baseline and associated  fevers and abnormal chest x-ray concerning for pneumonia Continue telemetry, follow blood culture 2, blood pressure soft, continue when necessary boluses Continue Rocephin azithromycin, Tamiflu 5 days -Supportive care with oxygen as needed-check ambulatory pulse oximetry Negative urinary strep and Legionella , blood culture no growth so far -Influenza PCR and respiratory viral panel **POSITIVE for Flu A  Pulmonary consulted and following, patient highly concerned about lung, cancer, has had a 40 pound unintentional weight loss in 5 years  Started on flutter valve, consider repeat CT scan of the chest if no better     Upper airway cough syndrome/G E R D/Barrett's esophagus -Has undergone extensive workup as an outpatient with pulmonology -Chloraseptic Spray, cont IV steroids, increase dose  -Budesonide nebulizers twice a day to ease paroxysmal coughing likely secondary to chronic recurrent inflammation -Continue PPI and H2 blockers as prior to admission -Reports recent EGD without significant issues -Recent outpatient CT of the chest unremarkable except for several noncalcified lung nodules with recommendations for follow-up CT in 4-6 months (study was ordered by outpatient pulmonologist) Autoimmune workup including rheumatoid arthritis, lupus ( daughter has lupus), positive rheumatoid factor, anti-ccp pending     Dyspnea on exertion -Patient reports was evaluated by cardiology and underwent stress test without any significant findings ([facility]) 2-D echo shows an EF of 55-60% without any regional wall motion abnormalities    HLD  -Continue preadmission medication   Depression/ADD  -Continue preadmission medications   Hyperparathyroidism, primary, s/p MI parathyroidectomy 7/18, 3.1 gm adenoma    DVT prophylaxsis Lovenox  Code Status:      Code Status Orders        Start  Ordered   09/04/15 1723  Full code   Continuous     09/04/15 1722     Family  Communication: Discussed in detail with the patient, all imaging results, lab results explained to the patient   Disposition Plan:  Anticipate discharge in 2-3 days      Consultants:  Pulmonary  Procedures:  None  Antibiotics: Anti-infectives    Start     Dose/Rate Route Frequency Ordered Stop   09/05/15 1300  azithromycin (ZITHROMAX) 500 mg in dextrose 5 % 250 mL IVPB     500 mg 250 mL/hr over 60 Minutes Intravenous Every 24 hours 09/04/15 1722 09/12/15 1259   09/05/15 1300  cefTRIAXone (ROCEPHIN) 1 g in dextrose 5 % 50 mL IVPB     1 g 100 mL/hr over 30 Minutes Intravenous Every 24 hours 09/04/15 1739 09/12/15 1259   09/04/15 2200  oseltamivir (TAMIFLU) capsule 75 mg     75 mg Oral 2 times daily 09/04/15 1719 09/09/15 2159   09/04/15 1515  cefTRIAXone (ROCEPHIN) 1 g in dextrose 5 % 50 mL IVPB  Status:  Discontinued     1 g 100 mL/hr over 30 Minutes Intravenous Every 24 hours 09/04/15 1507 09/04/15 1739   09/04/15 1315  levofloxacin (LEVAQUIN) IVPB 750 mg  Status:  Discontinued     750 mg 100 mL/hr over 90 Minutes Intravenous Every 24 hours 09/04/15 1309 09/04/15 1737         HPI/Subjective  Slightly better today, less chest tightness, coughing spells every 2-3 hours   Objective: Filed Vitals:   09/07/15 0407 09/07/15 0450 09/07/15 0519 09/07/15 0908  BP:  100/58    Pulse: 75 66 75   Temp:  97.5 F (36.4 C)    TempSrc:  Oral    Resp:  18    Height:      Weight:      SpO2: 98% 100% 97% 98%    Intake/Output Summary (Last 24 hours) at 09/07/15 1145 Last data filed at 09/07/15 1143  Gross per 24 hour  Intake    320 ml  Output   1725 ml  Net  -1405 ml    Exam:  General: No acute respiratory distress Lungs: Clear to auscultation bilaterally without wheezes or crackles Cardiovascular: Regular rate and rhythm without murmur gallop or rub normal S1 and S2 Abdomen: Nontender, nondistended, soft, bowel sounds positive, no rebound, no ascites, no appreciable  mass Extremities: No significant cyanosis, clubbing, or edema bilateral lower extremities     Data Review   Micro Results Recent Results (from the past 240 hour(s))  Respiratory virus panel     Status: Abnormal   Collection Time: 09/04/15  1:53 PM  Result Value Ref Range Status   Source - RVPAN NASOPHARYNGEAL  Corrected   Respiratory Syncytial Virus A Negative Negative Final   Respiratory Syncytial Virus B Negative Negative Final   Influenza A Positive (A) Negative Final    Comment: Subtype: H3   Influenza B Negative Negative Final   Parainfluenza 1 Negative Negative Final   Parainfluenza 2 Negative Negative Final   Parainfluenza 3 Negative Negative Final   Metapneumovirus Negative Negative Final   Rhinovirus Negative Negative Final   Adenovirus Negative Negative Final    Comment: (NOTE) Performed At: [facility] [address] [place], [place] [id number] Lindon Romp MD [id number]   Culture, blood (Routine X 2) w Reflex to ID Panel     Status: None (Preliminary result)   Collection Time:  09/04/15  3:25 PM  Result Value Ref Range Status   Specimen Description BLOOD LEFT ANTECUBITAL  Final   Special Requests BOTTLES DRAWN AEROBIC AND ANAEROBIC 5CC  Final   Culture NO GROWTH 2 DAYS  Final   Report Status PENDING  Incomplete  Culture, blood (Routine X 2) w Reflex to ID Panel     Status: None (Preliminary result)   Collection Time: 09/04/15  3:32 PM  Result Value Ref Range Status   Specimen Description BLOOD LEFT HAND  Final   Special Requests BOTTLES DRAWN AEROBIC ONLY [place]  Final   Culture NO GROWTH 2 DAYS  Final   Report Status PENDING  Incomplete  Culture, sputum-assessment     Status: None   Collection Time: 09/05/15  7:38 PM  Result Value Ref Range Status   Specimen Description SPUTUM  Final   Special Requests NONE  Final   Sputum evaluation   Final    MICROSCOPIC FINDINGS SUGGEST THAT THIS SPECIMEN IS NOT REPRESENTATIVE OF LOWER RESPIRATORY  SECRETIONS. PLEASE RECOLLECT. Louis Matte RN [id number] 09/05/15 A BROWNING    Report Status 09/05/2015 FINAL  Final    Radiology Reports Dg Chest 2 View  09/04/2015  CLINICAL DATA:  Cough, shortness of breath for the past 50 days, history of chronic bronchitis previous tobacco use, previous episodes of pneumonia. EXAM: CHEST  2 VIEW COMPARISON:  Chest x-ray of September 02, 2015 an chest x-ray dated May 02, 2015. FINDINGS: There are persistent confluent alveolar infiltrates in the upper lobes. These are somewhat more conspicuous today especially on the left. Coarse infrahilar density on the right is more conspicuous today. There is no pleural effusion. The heart and pulmonary vascularity are normal. The mediastinum is normal in width. The bony thorax exhibits no acute abnormality. There is an old fracture of the lateral aspect of the left sixth rib. IMPRESSION: Bilateral upper lobe pneumonia more conspicuous on the left today than previously demonstrated. Infrahilar atelectasis or pneumonia on the right. Electronically Signed   By: David  Martinique M.D.   On: 09/04/2015 12:37   Dg Chest 2 View  09/02/2015  CLINICAL DATA:  Shortness of breath, productive cough. EXAM: CHEST  2 VIEW COMPARISON:  August 26, 2015. FINDINGS: Stable cardiomediastinal silhouette. Old distal left clavicular fracture is noted. No pneumothorax or pleural effusion is noted. Significantly increased bilateral upper lobe airspace opacities are noted concerning for pneumonia or possibly edema. Osteophyte formation is noted in the thoracic spine. IMPRESSION: Significant increased bilateral upper lobe airspace opacities are noted concerning for pneumonia or possibly edema. Follow-up radiographs are recommended. Electronically Signed   By: Marijo Conception, M.D.   On: 09/02/2015 16:51   Dg Chest 2 View  08/27/2015  ADDENDUM REPORT: 08/27/2015 10:03 ADDENDUM: In the conclusion of the report I misspoke and said "left lower lobe  atelectasis" when in fact I should have said "right lower lobe atelectasis or early pneumonia". I also misspoke and indicated that "pulmonary nodules demonstrated on the February 27th CT scan.". I should have stated "CT scan of the chest of August 14, 2015.". The remainder of the impression is correct. I apologize for the confusion these errors have has caused. Electronically Signed   By: David  Martinique M.D.   On: 08/27/2015 10:03  08/27/2015  CLINICAL DATA:  One month of cough and chest congestion, history of pneumonia, mitral valve prolapse, former smoker. EXAM: CHEST  2 VIEW COMPARISON:  Chest x-ray of May 02, 2015 and CT scan of the  chest of August 14, 2015. FINDINGS: The lungs are well-expanded. The interstitial markings are chronically increased. However, increased interstitial density in the right infrahilar region posteriorly is present today. There is no air bronchogram. There is no pleural effusion or parenchymal mass. The trachea is midline. There is mild multilevel degenerative disc disease of the thoracic spine. IMPRESSION: Left lower lobe atelectasis or early pneumonia superimposed upon findings of chronic bronchitis. Followup PA and lateral chest X-ray is recommended in 3-4 weeks following trial of antibiotic therapy to ensure resolution and exclude underlying malignancy. Pulmonary nodules demonstrated on the February 27 CT scan are not clearly evident on this plain film and the previous recommendation for follow-up chest CT scanning in 4-6 months is again recommended. Electronically Signed: By: David  Martinique M.D. On: 08/26/2015 15:45   Ct Angio Chest Pe W/cm &/or Wo Cm  08/14/2015  CLINICAL DATA:  Chronic cough, shortness of breath for 5 days, right posterior chest pain EXAM: CT ANGIOGRAPHY CHEST WITH CONTRAST TECHNIQUE: Multidetector CT imaging of the chest was performed using the standard protocol during bolus administration of intravenous contrast. Multiplanar CT image reconstructions and  MIPs were obtained to evaluate the vascular anatomy. CONTRAST:  100 cc Isovue 370 COMPARISON:  Chest x-ray of 05/02/2015, and CT chest of 05/15/2009 Creatinine was obtained on site at [facility] at [address]Results: Creatinine 0.9 mg/dL. GFR of 89. FINDINGS: The pulmonary artery are well opacified and there is no evidence of acute pulmonary embolism. The thoracic aorta also opacifies with no acute abnormality noted. The origins of the great vessels are patent no mediastinal or hilar adenopathy is seen. No significant coronary artery calcifications are noted. There is no evidence of pericardial effusion. There is a moderate amount of epicardial fat present palpable incidental liver cysts are noted in the left lobe. Bilateral simple appearing renal cysts are present. The thyroid gland is unremarkable. On lung window images mild biapical pleural parenchymal scarring is present. A noncalcified nodule is present near the right lung apex posteriorly on image 11 measuring 6 mm, not definitely seen on the prior CT. There is a poorly defined 6 mm noncalcified nodule in the posterior aspect of the superior segment of the right lower lobe on image 31. Also, this nodule is not seen on the prior CT. A 5 mm noncalcified nodule is present in the left lower lobe laterally on image 39. This nodule was present and has not changed significantly compared to the CT from 2010. No additional pulmonary nodule is seen. In view of a few new lung nodules, a followup CT chest scan in 4-6 months is recommended to assess stability. No parenchymal infiltrate is noted and there is no evidence of pleural effusion. There are degenerative changes throughout the thoracic spine. Review of the MIP images confirms the above findings. IMPRESSION: 1. No evidence of acute pulmonary embolism is seen. 2. There are a few noncalcified lung nodules, not seen on the CT from 2010. Therefore followup CT chest in 4-6 months is recommended to  assess stability. Electronically Signed   By: Ivar Drape M.D.   On: 08/14/2015 15:06   [place] Cm  08/29/2015  CLINICAL DATA:  Upper airway cough syndrome EXAM: CT PARANASAL SINUS LIMITED WITHOUT CONTRAST TECHNIQUE: Non-contiguous multidetector CT images of the paranasal sinuses were obtained in a single plane without contrast. COMPARISON:  None. FINDINGS: Mild mucosal thickening in the maxillary sinus bilaterally. Right maxillary sinus clear. Mild mucosal edema in the ethmoid sinuses bilaterally.  Prior sinus surgery with partial ethmoidectomy and medial antrectomy bilaterally. The frontal and sphenoid with sinuses are clear. No air-fluid level. Mastoid sinus clear. No significant soft tissue abnormality. IMPRESSION: Mild mucosal edema in the ethmoid sinuses and the maxillary sinuses. Remaining sinuses are clear. Prior sinus surgery. Electronically Signed   By: Franchot Gallo M.D.   On: 08/29/2015 13:07     CBC  Recent Labs Lab 09/04/15 1012 09/05/15 0746 09/06/15 0625 09/07/15 0701  WBC 9.0 7.5 9.4 15.3*  HGB 11.5* 9.8* 10.3* 11.2*  HCT 35.3* 30.8* 32.5* 33.4*  PLT 450* 390 450* 514*  MCV 92.7 92.8 92.1 92.0  MCH 30.2 29.5 29.2 30.9  MCHC 32.6 31.8 31.7 33.5  RDW 12.9 13.1 12.8 12.7  LYMPHSABS 0.7 0.8  --   --   MONOABS 0.5 0.7  --   --   EOSABS 0.0 0.1  --   --   BASOSABS 0.0 0.0  --   --     Chemistries   Recent Labs Lab 09/04/15 1012 09/05/15 0746 09/06/15 0625 09/07/15 0701  NA 139 138 135 140  K 4.0 4.0 4.2 4.0  CL 100* 106 102 99*  CO2 25 23 23 30   GLUCOSE 104* 90 137* 133*  BUN 12 12 15  22*  CREATININE 0.91 0.89 0.70 0.82  CALCIUM 8.9 8.0* 8.6* 9.0  AST  --  50* 41 45*  ALT  --  78* 75* 82*  ALKPHOS  --  171* 161* 151*  BILITOT  --  0.4 0.4 0.4   ------------------------------------------------------------------------------------------------------------------ estimated creatinine clearance is 87 mL/min (by C-G formula based on Cr of  0.82). ------------------------------------------------------------------------------------------------------------------ No results for input(s): HGBA1C in the last 72 hours. ------------------------------------------------------------------------------------------------------------------ No results for input(s): CHOL, HDL, LDLCALC, TRIG, CHOLHDL, LDLDIRECT in the last 72 hours. ------------------------------------------------------------------------------------------------------------------ No results for input(s): TSH, T4TOTAL, T3FREE, THYROIDAB in the last 72 hours.  Invalid input(s): FREET3 ------------------------------------------------------------------------------------------------------------------ No results for input(s): VITAMINB12, FOLATE, FERRITIN, TIBC, IRON, RETICCTPCT in the last 72 hours.  Coagulation profile No results for input(s): INR, PROTIME in the last 168 hours.  No results for input(s): DDIMER in the last 72 hours.  Cardiac Enzymes  Recent Labs Lab 09/05/15 1213 09/06/15 0015  TROPONINI <0.03 <0.03   ------------------------------------------------------------------------------------------------------------------ Invalid input(s): POCBNP   CBG:  Recent Labs Lab 09/04/15 2102 09/05/15 0813 09/05/15 1231  GLUCAP 121* 84 102*       Studies: No results found.    Lab Results  Component Value Date   HGBA1C 6.0* 02/15/2013   Lab Results  Component Value Date   CREATININE 0.82 09/07/2015       Scheduled Meds: . antiseptic oral rinse  7 mL Mouth Rinse BID  . azithromycin  500 mg Intravenous Q24H  . budesonide (PULMICORT) nebulizer solution  0.25 mg Nebulization BID  . buPROPion  200 mg Oral BID  . cefTRIAXone (ROCEPHIN)  IV  1 g Intravenous Q24H  . chlorpheniramine-HYDROcodone  5 mL Oral TID  . enoxaparin (LOVENOX) injection  40 mg Subcutaneous Q24H  . famotidine  20 mg Oral QHS  . feeding supplement  1 Container Oral TID BM  .  FLUoxetine  40 mg Oral Daily  . gabapentin  100 mg Oral TID  . methylphenidate  10 mg Oral TID  . methylPREDNISolone (SOLU-MEDROL) injection  60 mg Intravenous Q6H  . multivitamin with minerals  1 tablet Oral Daily  . oseltamivir  75 mg Oral BID  . pantoprazole  40 mg Oral BID AC  .  polyethylene glycol  17 g Oral BID  . simvastatin  20 mg Oral QHS   Continuous Infusions:   Principal Problem:   CAP (community acquired pneumonia) Active Problems:   HLD (hyperlipidemia)   Depression   ADD (attention deficit disorder)   G E R D   Hyperparathyroidism, primary, s/p MI parathyroidectomy 7/18, 3.1 gm adenoma   Upper airway cough syndrome   Acute respiratory distress (HCC)   Barrett's esophagus   Dyspnea on exertion   Influenza   Cough    Time spent: 45 minutes   [facility] Pager [phone]. If 7PM-7AM, please contact night-coverage at [email or web address], password [facility] 09/07/2015, 11:45 AM  LOS: 3 days

## 2015-09-07 NOTE — Evaluation (Signed)
Physical Therapy Evaluation Patient Details Name: [name] MRN: [id number] DOB: June 17, 1951 Today's Date: 09/07/2015   History of Present Illness  65 y.o. year old male, admitted for worse cough and SOB. Pt sees Dr. Melvyn Novas. CXR with B PNA. Admitted for PNA and influenza.  Clinical Impression   Pt admitted with above diagnosis. Pt currently with functional limitations due to the deficits listed below (see PT Problem List). Pt will benefit from skilled PT to increase their independence and safety with mobility to allow discharge to the venue listed below.       Follow Up Recommendations Home health PT;Supervision - Intermittent    Equipment Recommendations  Rolling walker with 5" wheels;3in1 (PT)    Recommendations for Other Services       Precautions / Restrictions Precautions Precaution Comments: watch O2 sats      Mobility  Bed Mobility Overal bed mobility: Needs Assistance Bed Mobility: Supine to Sit     Supine to sit: Supervision     General bed mobility comments: Supervision mostly for lines; smooth motion getting up  Transfers Overall transfer level: Needs assistance Equipment used: Straight cane Transfers: Sit to/from Stand Sit to Stand: Supervision         General transfer comment: Stood without difficulty; a bit impulsive  Ambulation/Gait Ambulation/Gait assistance: Supervision;Min guard Ambulation Distance (Feet): 80 Feet Assistive device: Rolling walker (2 wheeled) Gait Pattern/deviations: Step-through pattern;Decreased stride length     General Gait Details: Noted unsteady with first few steps with cane so opted for RW; Cues to self-monitor for activity tlerance; episode of incessant coughing occured during walk, and that lead [place] back to the room a bit early  Stairs            Wheelchair Mobility    Modified Rankin (Stroke Patients Only)       Balance                                             Pertinent  Vitals/Pain Pain Assessment: No/denies pain    Home Living Family/patient expects to be discharged to:: Private residence Living Arrangements: Spouse/significant other Available Help at Discharge: Family;Available PRN/intermittently Type of Home: House Home Access:  (Will need more info re: steps to enter home)       Home Equipment: Gilford Rile - 2 wheels;Cane - single point      Prior Function Level of Independence: Independent with assistive device(s)         Comments: Has been using cane for the past month for balance     Hand Dominance        Extremity/Trunk Assessment   Upper Extremity Assessment: Overall WFL for tasks assessed           Lower Extremity Assessment: Generalized weakness         Communication   Communication: No difficulties  Cognition Arousal/Alertness: Awake/alert Behavior During Therapy: WFL for tasks assessed/performed Overall Cognitive Status: Within Functional Limits for tasks assessed                      General Comments General comments (skin integrity, edema, etc.): See other note of this date for O2 qualifying walk    Exercises        Assessment/Plan    PT Assessment Patient needs continued PT services  PT Diagnosis Generalized weakness   PT Problem List  Decreased strength;Decreased activity tolerance;Decreased balance;Decreased knowledge of use of DME;Decreased knowledge of precautions;Cardiopulmonary status limiting activity  PT Treatment Interventions DME instruction;Gait training;Stair training;Functional mobility training;Therapeutic activities;Patient/family education;Balance training   PT Goals (Current goals can be found in the Care Plan section) Acute Rehab PT Goals Patient Stated Goal: Hopes to get better and go home PT Goal Formulation: With patient Time For Goal Achievement: 09/21/15 Potential to Achieve Goals: Good    Frequency Min 3X/week   Barriers to discharge        Co-evaluation                End of Session Equipment Utilized During Treatment: Oxygen Activity Tolerance: Patient tolerated treatment well Patient left: in bed;with call bell/phone within reach (sitting EOB) Nurse Communication: Mobility status         Time: 1033-1050 PT Time Calculation (min) (ACUTE ONLY): 17 min   Charges:   PT Evaluation $PT Eval Moderate Complexity: 1 Procedure     PT G CodesQuin Hoop 09/07/2015, 12:53 PM  Roney Marion, [place] Pager [phone] Office [phone]

## 2015-09-08 ENCOUNTER — Ambulatory Visit: Payer: Medicare Other | Admitting: Internal Medicine

## 2015-09-08 DIAGNOSIS — F909 Attention-deficit hyperactivity disorder, unspecified type: Secondary | ICD-10-CM

## 2015-09-08 DIAGNOSIS — R0600 Dyspnea, unspecified: Secondary | ICD-10-CM

## 2015-09-08 LAB — CBC
HCT: 32.2 % — ABNORMAL LOW (ref 39.0–52.0)
HEMOGLOBIN: 10.7 g/dL — AB (ref 13.0–17.0)
MCH: 30.5 pg (ref 26.0–34.0)
MCHC: 33.2 g/dL (ref 30.0–36.0)
MCV: 91.7 fL (ref 78.0–100.0)
Platelets: 532 10*3/uL — ABNORMAL HIGH (ref 150–400)
RBC: 3.51 MIL/uL — AB (ref 4.22–5.81)
RDW: 12.9 % (ref 11.5–15.5)
WBC: 13.7 10*3/uL — AB (ref 4.0–10.5)

## 2015-09-08 LAB — SEDIMENTATION RATE: SED RATE: 70 mm/h — AB (ref 0–16)

## 2015-09-08 MED ORDER — AZITHROMYCIN 500 MG PO TABS: 500.0000 mg | ORAL_TABLET | Freq: Every day | ORAL | Status: DC

## 2015-09-08 MED ORDER — METHYLPREDNISOLONE SODIUM SUCC 125 MG IJ SOLR: 60.0000 mg | Freq: Two times a day (BID) | INTRAMUSCULAR | Status: DC

## 2015-09-08 MED ORDER — DEXAMETHASONE SODIUM PHOSPHATE 4 MG/ML IJ SOLN
4.0000 mg | Freq: Four times a day (QID) | INTRAMUSCULAR | Status: DC
  Administered 2015-09-08 – 2015-09-09 (×4): 4 mg via INTRAVENOUS
  Filled 2015-09-08 (×4): qty 1

## 2015-09-08 MED ORDER — CEPHALEXIN 250 MG/5ML PO SUSR
500.0000 mg | Freq: Three times a day (TID) | ORAL | Status: DC
  Filled 2015-09-08: qty 10

## 2015-09-08 MED ORDER — CEPHALEXIN 500 MG PO CAPS
500.0000 mg | ORAL_CAPSULE | Freq: Three times a day (TID) | ORAL | Status: DC
  Administered 2015-09-08 – 2015-09-09 (×3): 500 mg via ORAL
  Filled 2015-09-08 (×3): qty 1

## 2015-09-08 NOTE — Progress Notes (Addendum)
[facility] PROGRESS NOTE  [name] [id number] DOB: 02-28-1951 DOA: 09/04/2015 PCP: Lilian Coma, MD  Length of stay: 4   Assessment/Plan: Principal Problem:   CAP (community acquired pneumonia) Active Problems:   HLD (hyperlipidemia)   Depression   ADD (attention deficit disorder)   G E R D   Hyperparathyroidism, primary, s/p MI parathyroidectomy 7/18, 3.1 gm adenoma   Upper airway cough syndrome   Acute respiratory distress (HCC)   Barrett's esophagus   Dyspnea on exertion   Influenza   Cough    Brief summary 65 year old male patient has a history of GERD and Barrett's esophagitis, depression and ADD on medications, dyslipidemia, history of hyperparathyroidism status post parathyroidectomy, prior hospitalization for ileus with small bowel obstruction, and history of left hip fracture with total hip replacement. According to the patient and his wife over the past 52 days he has had issues with chronic paroxysmal coughing that is worse when he lays backwards. At times this is associated with shortness of breath and/or dyspnea on exertion. He's had multiple outpatient evaluations: cardiology, GI and pulmonary medicine. It was felt the patient had reflux mediated upper airway cough syndrome and he has been maintained on breakfast and lunch Prilosec and bedtime Pepcid. About 2 weeks ago he had bronchitis-type symptomatology and was prescribed Bactrim by the pulmonologist. He returned on 2/28 and chest x-ray revealed bilateral upper lobe pneumonia and patient was started on Levaquin on 3/1 of which he took one dose. He reports worsening shortness of breath and dyspnea on exertion and nonproductive cough. He is now having fevers with MAXIMUM TEMPERATURE is up to 102 Patient also found to be positive for influenza A   Assessment and plan Acute respiratory distress/CAP without sepsis, acute on chronic COPD exacerbation -Presents with progressive shortness of breath  worsened baseline and associated fevers and abnormal chest x-ray concerning for pneumonia Continue telemetry, blood culture no growth , Continue Rocephin azithromycin, Tamiflu 5 days -Supportive care with oxygen as needed-check ambulatory pulse oximetry Negative urinary strep and Legionella , blood culture no growth so far -Influenza PCR and respiratory viral panel **POSITIVE for Flu A  Pulmonary consulted and following, patient highly concerned about lung, cancer, has had a 40 pound unintentional weight loss in 5 years  Started on flutter valve, consider repeat CT scan of the chest if no better Wean off steroids. Cont pulmicort. Cont duoneb qid. Cont flutter valve. Cont PP  Anticipate discharge tomorrow    Upper airway cough syndrome/G E R D/Barrett's esophagus -Has undergone extensive workup as an outpatient with pulmonology -Chloraseptic Spray, cont IV steroids, increase dose  -Budesonide nebulizers twice a day to ease paroxysmal coughing likely secondary to chronic recurrent inflammation -Continue PPI and H2 blockers as prior to admission -Reports recent EGD without significant issues -Recent outpatient CT of the chest unremarkable except for several noncalcified lung nodules with recommendations for follow-up CT in 4-6 months (study was ordered by outpatient pulmonologist) Autoimmune workup including rheumatoid arthritis, lupus ( daughter has lupus), positive rheumatoid factor, anti-ccp  Negative      Dyspnea on exertion -Patient reports was evaluated by cardiology and underwent stress test without any significant findings ([place]) 2-D echo shows an EF of 55-60% without any regional wall motion abnormalities    HLD  -Continue preadmission medication   Depression/ADD  -Continue preadmission medications   Hyperparathyroidism, primary, s/p MI parathyroidectomy 7/18, 3.1 gm adenoma    DVT prophylaxsis Lovenox  Code Status:  Code Status Orders        Start      Ordered   09/04/15 1723  Full code   Continuous     09/04/15 1722     Family Communication: Discussed in detail with the patient, all imaging results, lab results explained to the patient   Disposition Plan:    arrange for home oxygen, anticipate discharge tomorrow      Consultants:  Pulmonary  Procedures:  None  Antibiotics: Anti-infectives    Start     Dose/Rate Route Frequency Ordered Stop   09/05/15 1300  azithromycin (ZITHROMAX) 500 mg in dextrose 5 % 250 mL IVPB     500 mg 250 mL/hr over 60 Minutes Intravenous Every 24 hours 09/04/15 1722 09/12/15 1259   09/05/15 1300  cefTRIAXone (ROCEPHIN) 1 g in dextrose 5 % 50 mL IVPB     1 g 100 mL/hr over 30 Minutes Intravenous Every 24 hours 09/04/15 1739 09/12/15 1259   09/04/15 2200  oseltamivir (TAMIFLU) capsule 75 mg     75 mg Oral 2 times daily 09/04/15 1719 09/09/15 2159   09/04/15 1515  cefTRIAXone (ROCEPHIN) 1 g in dextrose 5 % 50 mL IVPB  Status:  Discontinued     1 g 100 mL/hr over 30 Minutes Intravenous Every 24 hours 09/04/15 1507 09/04/15 1739   09/04/15 1315  levofloxacin (LEVAQUIN) IVPB 750 mg  Status:  Discontinued     750 mg 100 mL/hr over 90 Minutes Intravenous Every 24 hours 09/04/15 1309 09/04/15 1737         HPI/Subjective  patient continues to have paroxysms of cough when trying to speak or take a deep breath   Objective: Filed Vitals:   09/07/15 1437 09/07/15 2008 09/07/15 2119 09/08/15 0446  BP: 104/52  92/52 98/54  Pulse: 77 80 72 68  Temp: 97.8 F (36.6 C)  97.5 F (36.4 C) 98.1 F (36.7 C)  TempSrc: Oral  Oral Oral  Resp: 20 18 20 16   Height:      Weight:      SpO2: 100% 99% 94% 98%    Intake/Output Summary (Last 24 hours) at 09/08/15 1203 Last data filed at 09/08/15 0915  Gross per 24 hour  Intake    560 ml  Output    700 ml  Net   -140 ml    Exam:  General: No acute respiratory distress Lungs: Clear to auscultation bilaterally without wheezes or  crackles Cardiovascular: Regular rate and rhythm without murmur gallop or rub normal S1 and S2 Abdomen: Nontender, nondistended, soft, bowel sounds positive, no rebound, no ascites, no appreciable mass Extremities: No significant cyanosis, clubbing, or edema bilateral lower extremities     Data Review   Micro Results Recent Results (from the past 240 hour(s))  Respiratory virus panel     Status: Abnormal   Collection Time: 09/04/15  1:53 PM  Result Value Ref Range Status   Source - RVPAN NASOPHARYNGEAL  Corrected   Respiratory Syncytial Virus A Negative Negative Final   Respiratory Syncytial Virus B Negative Negative Final   Influenza A Positive (A) Negative Final    Comment: Subtype: H3   Influenza B Negative Negative Final   Parainfluenza 1 Negative Negative Final   Parainfluenza 2 Negative Negative Final   Parainfluenza 3 Negative Negative Final   Metapneumovirus Negative Negative Final   Rhinovirus Negative Negative Final   Adenovirus Negative Negative Final    Comment: (NOTE) Performed At: [facility] [employer] [address]  [place], [place] [id number] Lindon Romp MD [id number]   Culture, blood (Routine X 2) w Reflex to ID Panel     Status: None (Preliminary result)   Collection Time: 09/04/15  3:25 PM  Result Value Ref Range Status   Specimen Description BLOOD LEFT ANTECUBITAL  Final   Special Requests BOTTLES DRAWN AEROBIC AND ANAEROBIC 5CC  Final   Culture NO GROWTH 3 DAYS  Final   Report Status PENDING  Incomplete  Culture, blood (Routine X 2) w Reflex to ID Panel     Status: None (Preliminary result)   Collection Time: 09/04/15  3:32 PM  Result Value Ref Range Status   Specimen Description BLOOD LEFT HAND  Final   Special Requests BOTTLES DRAWN AEROBIC ONLY [place]  Final   Culture NO GROWTH 3 DAYS  Final   Report Status PENDING  Incomplete  Culture, sputum-assessment     Status: None   Collection Time: 09/05/15  7:38 PM  Result Value Ref Range Status    Specimen Description SPUTUM  Final   Special Requests NONE  Final   Sputum evaluation   Final    MICROSCOPIC FINDINGS SUGGEST THAT THIS SPECIMEN IS NOT REPRESENTATIVE OF LOWER RESPIRATORY SECRETIONS. PLEASE RECOLLECT. Louis Matte RN [id number] 09/05/15 A BROWNING    Report Status 09/05/2015 FINAL  Final    Radiology Reports Dg Chest 2 View  09/04/2015  CLINICAL DATA:  Cough, shortness of breath for the past 50 days, history of chronic bronchitis previous tobacco use, previous episodes of pneumonia. EXAM: CHEST  2 VIEW COMPARISON:  Chest x-ray of September 02, 2015 an chest x-ray dated May 02, 2015. FINDINGS: There are persistent confluent alveolar infiltrates in the upper lobes. These are somewhat more conspicuous today especially on the left. Coarse infrahilar density on the right is more conspicuous today. There is no pleural effusion. The heart and pulmonary vascularity are normal. The mediastinum is normal in width. The bony thorax exhibits no acute abnormality. There is an old fracture of the lateral aspect of the left sixth rib. IMPRESSION: Bilateral upper lobe pneumonia more conspicuous on the left today than previously demonstrated. Infrahilar atelectasis or pneumonia on the right. Electronically Signed   By: David  Martinique M.D.   On: 09/04/2015 12:37   Dg Chest 2 View  09/02/2015  CLINICAL DATA:  Shortness of breath, productive cough. EXAM: CHEST  2 VIEW COMPARISON:  August 26, 2015. FINDINGS: Stable cardiomediastinal silhouette. Old distal left clavicular fracture is noted. No pneumothorax or pleural effusion is noted. Significantly increased bilateral upper lobe airspace opacities are noted concerning for pneumonia or possibly edema. Osteophyte formation is noted in the thoracic spine. IMPRESSION: Significant increased bilateral upper lobe airspace opacities are noted concerning for pneumonia or possibly edema. Follow-up radiographs are recommended. Electronically Signed   By: Marijo Conception, M.D.   On: 09/02/2015 16:51   Dg Chest 2 View  08/27/2015  ADDENDUM REPORT: 08/27/2015 10:03 ADDENDUM: In the conclusion of the report I misspoke and said "left lower lobe atelectasis" when in fact I should have said "right lower lobe atelectasis or early pneumonia". I also misspoke and indicated that "pulmonary nodules demonstrated on the February 27th CT scan.". I should have stated "CT scan of the chest of August 14, 2015.". The remainder of the impression is correct. I apologize for the confusion these errors have has caused. Electronically Signed   By: David  Martinique M.D.   On: 08/27/2015 10:03  08/27/2015  CLINICAL DATA:  One  month of cough and chest congestion, history of pneumonia, mitral valve prolapse, former smoker. EXAM: CHEST  2 VIEW COMPARISON:  Chest x-ray of May 02, 2015 and CT scan of the chest of August 14, 2015. FINDINGS: The lungs are well-expanded. The interstitial markings are chronically increased. However, increased interstitial density in the right infrahilar region posteriorly is present today. There is no air bronchogram. There is no pleural effusion or parenchymal mass. The trachea is midline. There is mild multilevel degenerative disc disease of the thoracic spine. IMPRESSION: Left lower lobe atelectasis or early pneumonia superimposed upon findings of chronic bronchitis. Followup PA and lateral chest X-ray is recommended in 3-4 weeks following trial of antibiotic therapy to ensure resolution and exclude underlying malignancy. Pulmonary nodules demonstrated on the February 27 CT scan are not clearly evident on this plain film and the previous recommendation for follow-up chest CT scanning in 4-6 months is again recommended. Electronically Signed: By: David  Martinique M.D. On: 08/26/2015 15:45   Ct Angio Chest Pe W/cm &/or Wo Cm  08/14/2015  CLINICAL DATA:  Chronic cough, shortness of breath for 5 days, right posterior chest pain EXAM: CT ANGIOGRAPHY CHEST WITH CONTRAST  TECHNIQUE: Multidetector CT imaging of the chest was performed using the standard protocol during bolus administration of intravenous contrast. Multiplanar CT image reconstructions and MIPs were obtained to evaluate the vascular anatomy. CONTRAST:  100 cc Isovue 370 COMPARISON:  Chest x-ray of 05/02/2015, and CT chest of 05/15/2009 Creatinine was obtained on site at [facility] at [address]Results: Creatinine 0.9 mg/dL. GFR of 89. FINDINGS: The pulmonary artery are well opacified and there is no evidence of acute pulmonary embolism. The thoracic aorta also opacifies with no acute abnormality noted. The origins of the great vessels are patent no mediastinal or hilar adenopathy is seen. No significant coronary artery calcifications are noted. There is no evidence of pericardial effusion. There is a moderate amount of epicardial fat present palpable incidental liver cysts are noted in the left lobe. Bilateral simple appearing renal cysts are present. The thyroid gland is unremarkable. On lung window images mild biapical pleural parenchymal scarring is present. A noncalcified nodule is present near the right lung apex posteriorly on image 11 measuring 6 mm, not definitely seen on the prior CT. There is a poorly defined 6 mm noncalcified nodule in the posterior aspect of the superior segment of the right lower lobe on image 31. Also, this nodule is not seen on the prior CT. A 5 mm noncalcified nodule is present in the left lower lobe laterally on image 39. This nodule was present and has not changed significantly compared to the CT from 2010. No additional pulmonary nodule is seen. In view of a few new lung nodules, a followup CT chest scan in 4-6 months is recommended to assess stability. No parenchymal infiltrate is noted and there is no evidence of pleural effusion. There are degenerative changes throughout the thoracic spine. Review of the MIP images confirms the above findings. IMPRESSION: 1. No  evidence of acute pulmonary embolism is seen. 2. There are a few noncalcified lung nodules, not seen on the CT from 2010. Therefore followup CT chest in 4-6 months is recommended to assess stability. Electronically Signed   By: Ivar Drape M.D.   On: 08/14/2015 15:06   [place] Cm  08/29/2015  CLINICAL DATA:  Upper airway cough syndrome EXAM: CT PARANASAL SINUS LIMITED WITHOUT CONTRAST TECHNIQUE: Non-contiguous multidetector CT images of the paranasal sinuses  were obtained in a single plane without contrast. COMPARISON:  None. FINDINGS: Mild mucosal thickening in the maxillary sinus bilaterally. Right maxillary sinus clear. Mild mucosal edema in the ethmoid sinuses bilaterally. Prior sinus surgery with partial ethmoidectomy and medial antrectomy bilaterally. The frontal and sphenoid with sinuses are clear. No air-fluid level. Mastoid sinus clear. No significant soft tissue abnormality. IMPRESSION: Mild mucosal edema in the ethmoid sinuses and the maxillary sinuses. Remaining sinuses are clear. Prior sinus surgery. Electronically Signed   By: Franchot Gallo M.D.   On: 08/29/2015 13:07     CBC  Recent Labs Lab 09/04/15 1012 09/05/15 0746 09/06/15 0625 09/07/15 0701 09/08/15 0517  WBC 9.0 7.5 9.4 15.3* 13.7*  HGB 11.5* 9.8* 10.3* 11.2* 10.7*  HCT 35.3* 30.8* 32.5* 33.4* 32.2*  PLT 450* 390 450* 514* 532*  MCV 92.7 92.8 92.1 92.0 91.7  MCH 30.2 29.5 29.2 30.9 30.5  MCHC 32.6 31.8 31.7 33.5 33.2  RDW 12.9 13.1 12.8 12.7 12.9  LYMPHSABS 0.7 0.8  --   --   --   MONOABS 0.5 0.7  --   --   --   EOSABS 0.0 0.1  --   --   --   BASOSABS 0.0 0.0  --   --   --     Chemistries   Recent Labs Lab 09/04/15 1012 09/05/15 0746 09/06/15 0625 09/07/15 0701  NA 139 138 135 140  K 4.0 4.0 4.2 4.0  CL 100* 106 102 99*  CO2 25 23 23 30   GLUCOSE 104* 90 137* 133*  BUN 12 12 15  22*  CREATININE 0.91 0.89 0.70 0.82  CALCIUM 8.9 8.0* 8.6* 9.0  AST  --  50* 41 45*  ALT  --  78* 75* 82*   ALKPHOS  --  171* 161* 151*  BILITOT  --  0.4 0.4 0.4   ------------------------------------------------------------------------------------------------------------------ estimated creatinine clearance is 87 mL/min (by C-G formula based on Cr of 0.82). ------------------------------------------------------------------------------------------------------------------ No results for input(s): HGBA1C in the last 72 hours. ------------------------------------------------------------------------------------------------------------------ No results for input(s): CHOL, HDL, LDLCALC, TRIG, CHOLHDL, LDLDIRECT in the last 72 hours. ------------------------------------------------------------------------------------------------------------------ No results for input(s): TSH, T4TOTAL, T3FREE, THYROIDAB in the last 72 hours.  Invalid input(s): FREET3 ------------------------------------------------------------------------------------------------------------------ No results for input(s): VITAMINB12, FOLATE, FERRITIN, TIBC, IRON, RETICCTPCT in the last 72 hours.  Coagulation profile No results for input(s): INR, PROTIME in the last 168 hours.  No results for input(s): DDIMER in the last 72 hours.  Cardiac Enzymes  Recent Labs Lab 09/05/15 1213 09/06/15 0015  TROPONINI <0.03 <0.03   ------------------------------------------------------------------------------------------------------------------ Invalid input(s): POCBNP   CBG:  Recent Labs Lab 09/04/15 2102 09/05/15 0813 09/05/15 1231  GLUCAP 121* 84 102*       Studies: No results found.    Lab Results  Component Value Date   HGBA1C 6.0* 02/15/2013   Lab Results  Component Value Date   CREATININE 0.82 09/07/2015       Scheduled Meds: . antiseptic oral rinse  7 mL Mouth Rinse BID  . azithromycin  500 mg Intravenous Q24H  . budesonide (PULMICORT) nebulizer solution  0.25 mg Nebulization BID  . buPROPion  200 mg Oral  BID  . cefTRIAXone (ROCEPHIN)  IV  1 g Intravenous Q24H  . chlorpheniramine-HYDROcodone  5 mL Oral TID  . enoxaparin (LOVENOX) injection  40 mg Subcutaneous Q24H  . famotidine  20 mg Oral QHS  . feeding supplement  1 Container Oral TID BM  . FLUoxetine  40 mg Oral Daily  .  gabapentin  100 mg Oral TID  . methylphenidate  10 mg Oral TID  . methylPREDNISolone (SOLU-MEDROL) injection  60 mg Intravenous Q6H  . multivitamin with minerals  1 tablet Oral Daily  . oseltamivir  75 mg Oral BID  . pantoprazole  40 mg Oral BID AC  . polyethylene glycol  17 g Oral BID  . simvastatin  20 mg Oral QHS   Continuous Infusions:   Principal Problem:   CAP (community acquired pneumonia) Active Problems:   HLD (hyperlipidemia)   Depression   ADD (attention deficit disorder)   G E R D   Hyperparathyroidism, primary, s/p MI parathyroidectomy 7/18, 3.1 gm adenoma   Upper airway cough syndrome   Acute respiratory distress (HCC)   Barrett's esophagus   Dyspnea on exertion   Influenza   Cough    Time spent: 45 minutes   [facility] Pager [phone]. If 7PM-7AM, please contact night-coverage at [email or web address], password [email or web address] 09/08/2015, 12:03 PM  LOS: 4 days

## 2015-09-08 NOTE — Care Management Note (Addendum)
Case Management Note  Patient Details  Name: PETRO MALETTE MRN: [id number] Date of Birth: [birth year]  Subjective/Objective:                 Patient with PNA and Flu. Ambulatory sat 87% RA improved w/ O2, yeserday. Dc planned for tomorrow, wil re-eval as needed prior to DC.  Referral made to [facility] for eval for O2 approval at DC. Referral made for disease management and new O2 requirement to [facility] for [facility] RN. We re-eval for necessity at DC.    Action/Plan: 09-09-15 Patient to DC to home with Home O2, RW and 3in1. DME will be delivered to room prior to discharge through [facility]. Patient will also receibe HH through [facility].  Expected Discharge Date:                  Expected Discharge Plan:  [place]  In-House Referral:     Discharge planning Services  CM Consult  Post Acute Care Choice:  Home Health, Durable Medical Equipment Choice offered to:  Patient  DME Arranged:  Oxygen DME Agency:  [place]:  RN [facility] Agency:  [place]  Status of Service:  In process, will continue to follow  Medicare Important Message Given:    Date Medicare IM Given:    Medicare IM give by:    Date Additional Medicare IM Given:    Additional Medicare Important Message give by:     If discussed at [place] of Stay Meetings, dates discussed:    Additional Comments:  Carles Collet, RN 09/08/2015, 3:29 PM

## 2015-09-08 NOTE — Progress Notes (Signed)
SATURATION QUALIFICATIONS: (This note is used to comply with regulatory documentation for home oxygen)  Patient Saturations on Room Air at Rest = 96%  Patient Saturations on Room Air while Ambulating = 86%  Patient Saturations on 3 Liters of oxygen while Ambulating = 96%  Please briefly explain why patient needs home oxygen: decrease in saturations with activity.  Governor Rooks, PTA pager [phone]

## 2015-09-08 NOTE — Consult Note (Signed)
   Name: [name] MRN: [id number] DOB: Nov 12, 1950    ADMISSION DATE:  09/04/2015 CONSULTATION DATE:  3/4  REFERRING MD :  abrol ([facility])   CHIEF COMPLAINT:  Cough   BRIEF PATIENT DESCRIPTION: 65yo male with hx mitral valve prolapse, GERD.barretts esophagus, hiatal hernia, upper airway cough syndrome followed by [name] thought r/t GERD.  Previous w/u in 2009 by [name] with normal PFT's.  Presented 3/2 with fever, worsening cough and dyspnea.  Pt was POS for Flu A. Admitted by [facility] with bilat upper lobe PNA and treated with IV rocephin, azithro and tamiflu.  PCCM consulted 3/4 for ongoing dyspnea and cough and newly POS RF.    SIGNIFICANT EVENTS   STUDIES:   HISTORY OF PRESENT ILLNESS:  65yo male with hx mitral valve prolapse, GERD, hiatal hernia, upper airway cough syndrome followed by [name] thought r/t GERD.  Previous w/u in 2009 by [name] with normal PFT's.  Presented 3/2 with fever, worsening cough and dyspnea.  Pt was POS for Flu A. Admitted by [facility] with bilat upper lobe PNA and treated with IV rocephin, azithro and tamiflu.  PCCM consulted 3/4 for ongoing dyspnea and cough.    Daughter has SLE.  Pt denies joint pain/swelling, rash.  Quit smoking 1989.     SUBJECTIVE: about same, cough persistent   VITAL SIGNS: Temp:  [97.5 F (36.4 C)-98.2 F (36.8 C)] 98.2 F (36.8 C) (03/06 1358) Pulse Rate:  [68-80] 79 (03/06 1358) Resp:  [16-20] 20 (03/06 1358) BP: (92-98)/(49-54) 94/49 mmHg (03/06 1358) SpO2:  [94 %-99 %] 97 % (03/06 1358)  PHYSICAL EXAMINATION: General:  Chronically ill appearing male, NAD  Neuro:  Awake, alert, appropriate, MAE HEENT:  Mm moist, no JVD, air movement abnormal studdering Cardiovascular:  s1s2  Lungs: few scattered rhonchi Abdomen:  Round, soft, +BS  Musculoskeletal:  Warm and dry, no edema   Recent Labs Lab 09/05/15 0746 09/06/15 0625 09/07/15 0701  NA 138 135 140  K 4.0 4.2 4.0  CL 106 102 99*  CO2 '23 23 30  '$ BUN 12 15 22*    CREATININE 0.89 0.70 0.82  GLUCOSE 90 137* 133*    Recent Labs Lab 09/06/15 0625 09/07/15 0701 09/08/15 0517  HGB 10.3* 11.2* 10.7*  HCT 32.5* 33.4* 32.2*  WBC 9.4 15.3* 13.7*  PLT 450* 514* 532*   No results found.  ASSESSMENT / PLAN:  CAP Upper airway cough syndrome -- ongoing issue at baseline.  Followed closely by pulm and GI, recent EGD wnl Flu A GERD Clear vocal cord irrtiation  RF = 36.8 Dsdna = neg Anti-scl-70 = neg Esr 70 ccp neg  PLAN -  Cont abx per primary - rocephin dcAzithro unlikely also atypical illness Cont tamiflu  Max PPI, H2 as per home rx  Continue IV steroids for now, but would consider change to less mineralocorticoid affect from solumedrol to decadron and more frequent Cough suppression  Repeat pcxr for apical infiltrates in am    Lavon Paganini. Titus Mould, MD, [place] Pgr: [facility]

## 2015-09-08 NOTE — Progress Notes (Signed)
Physical Therapy Treatment Patient Details Name: [name] MRN: [id number] DOB: [birth year] Today's Date: 10/02/2015    History of Present Illness 65 y.o. year old male, admitted for worse cough and SOB. Pt sees Dr. Melvyn Novas. CXR with B PNA. Admitted for PNA and influenza.    PT Comments    Pt performed increased mobility SPO2 decreased to 86% on RA.  Improved to 96% on 3L.    Follow Up Recommendations  Home health PT;Supervision - Intermittent     Equipment Recommendations  Rolling walker with 5" wheels;3in1 (PT)    Recommendations for Other Services       Precautions / Restrictions Precautions Precaution Comments: watch O2 sats    Mobility  Bed Mobility Overal bed mobility: Modified Independent Bed Mobility: Supine to Sit     Supine to sit: Modified independent (Device/Increase time)     General bed mobility comments: HOB elevated.    Transfers Overall transfer level: Needs assistance Equipment used: Rolling walker (2 wheeled) Transfers: Sit to/from Stand Sit to Stand: Supervision         General transfer comment: Stood without difficulty; a bit impulsive  Ambulation/Gait Ambulation/Gait assistance: Supervision Ambulation Distance (Feet): 250 Feet (x2 trials.  ) Assistive device: Rolling walker (2 wheeled) Gait Pattern/deviations: Step-through pattern;Decreased stride length     General Gait Details: remains unsteady but no true, LOB reports feeling light headed.  Cues for RW safety and increasing B stride.     Stairs            Wheelchair Mobility    Modified Rankin (Stroke Patients Only)       Balance                                    Cognition Arousal/Alertness: Awake/alert Behavior During Therapy: WFL for tasks assessed/performed Overall Cognitive Status: Within Functional Limits for tasks assessed                      Exercises      General Comments        Pertinent Vitals/Pain Pain Assessment:  No/denies pain    Home Living                      Prior Function            PT Goals (current goals can now be found in the care plan section) Acute Rehab PT Goals Patient Stated Goal: Hopes to get better and go home Potential to Achieve Goals: Good Progress towards PT goals: Progressing toward goals    Frequency  Min 3X/week    PT Plan      Co-evaluation             End of Session Equipment Utilized During Treatment: Gait belt;Oxygen Activity Tolerance: Patient tolerated treatment well Patient left: in chair;with call bell/phone within reach     Time: [id number] PT Time Calculation (min) (ACUTE ONLY): 25 min  Charges:  $Gait Training: 23-37 mins                    G Codes:      Cristela Blue 2015/10/02, 4:56 PM Governor Rooks, PTA pager [phone]

## 2015-09-09 ENCOUNTER — Inpatient Hospital Stay (HOSPITAL_COMMUNITY): Payer: Medicare Other

## 2015-09-09 DIAGNOSIS — K2270 Barrett's esophagus without dysplasia: Secondary | ICD-10-CM

## 2015-09-09 LAB — CULTURE, BLOOD (ROUTINE X 2)
Culture: NO GROWTH
Culture: NO GROWTH

## 2015-09-09 MED ORDER — HYDROCOD POLST-CPM POLST ER 10-8 MG/5ML PO SUER
5.0000 mL | Freq: Two times a day (BID) | ORAL | Status: DC
  Administered 2015-09-09: 5 mL via ORAL
  Filled 2015-09-09: qty 5

## 2015-09-09 MED ORDER — BUDESONIDE 0.25 MG/2ML IN SUSP: 0.2500 mg | Freq: Two times a day (BID) | RESPIRATORY_TRACT | Status: DC

## 2015-09-09 MED ORDER — FAMOTIDINE 20 MG PO TABS: 20.0000 mg | ORAL_TABLET | Freq: Every day | ORAL | Status: AC

## 2015-09-09 MED ORDER — BUDESONIDE 0.25 MG/2ML IN SUSP
0.2500 mg | Freq: Two times a day (BID) | RESPIRATORY_TRACT | Status: DC
  Administered 2015-09-09: 0.25 mg via RESPIRATORY_TRACT

## 2015-09-09 MED ORDER — ALBUTEROL SULFATE HFA 108 (90 BASE) MCG/ACT IN AERS: 2.0000 | INHALATION_SPRAY | Freq: Four times a day (QID) | RESPIRATORY_TRACT | Status: DC | PRN

## 2015-09-09 MED ORDER — CEPHALEXIN 500 MG PO CAPS: 500.0000 mg | ORAL_CAPSULE | Freq: Three times a day (TID) | ORAL | Status: DC

## 2015-09-09 MED ORDER — HYDROCOD POLST-CPM POLST ER 10-8 MG/5ML PO SUER: 5.0000 mL | Freq: Two times a day (BID) | ORAL | Status: DC

## 2015-09-09 MED ORDER — DIPHENHYDRAMINE HCL 25 MG PO CAPS: 25.0000 mg | ORAL_CAPSULE | Freq: Four times a day (QID) | ORAL | Status: DC

## 2015-09-09 MED ORDER — FLUTICASONE PROPIONATE 50 MCG/ACT NA SUSP
2.0000 | Freq: Two times a day (BID) | NASAL | Status: DC
  Administered 2015-09-09: 2 via NASAL
  Filled 2015-09-09: qty 16

## 2015-09-09 MED ORDER — DEXAMETHASONE 4 MG PO TABS: ORAL_TABLET | ORAL | Status: DC

## 2015-09-09 MED ORDER — FLUTICASONE PROPIONATE 50 MCG/ACT NA SUSP: 2.0000 | Freq: Two times a day (BID) | NASAL | Status: DC

## 2015-09-09 NOTE — Discharge Summary (Signed)
Physician Discharge Summary  [name] MRN: [id number] DOB/AGE: December 04, 1950 65 y.o.  PCP: Lilian Coma, MD   Admit date: 09/04/2015 Discharge date: 09/09/2015  Discharge Diagnoses:   Principal Problem:   CAP (community acquired pneumonia) Active Problems:   HLD (hyperlipidemia)   Depression   ADD (attention deficit disorder)   G E R D   Hyperparathyroidism, primary, s/p MI parathyroidectomy 7/18, 3.1 gm adenoma   Upper airway cough syndrome   Acute respiratory distress (HCC)   Barrett's esophagus   Dyspnea on exertion   Influenza   Cough    Follow-up recommendations Follow-up with PCP in 3-5 days , including all  additional recommended appointments as below Follow-up CBC, CMP in 3-5 days Patient follow-up with Dr. Melvyn Novas in one week at the pulmonary clinic     Medication List    STOP taking these medications        levofloxacin 500 MG tablet  Commonly known as:  LEVAQUIN      TAKE these medications        ALPRAZolam 0.5 MG tablet  Commonly known as:  XANAX  Take 1 tablet (0.5 mg total) by mouth at bedtime as needed for anxiety.     budesonide 0.25 MG/2ML nebulizer solution  Commonly known as:  PULMICORT  Take 2 mLs (0.25 mg total) by nebulization 2 (two) times daily.     buPROPion 200 MG 12 hr tablet  Commonly known as:  WELLBUTRIN SR  Take 200 mg by mouth 2 (two) times daily.     cephALEXin 500 MG capsule  Commonly known as:  KEFLEX  Take 1 capsule (500 mg total) by mouth every 8 (eight) hours.     chlorpheniramine-HYDROcodone 10-8 MG/5ML Suer  Commonly known as:  TUSSIONEX  Take 5 mLs by mouth every 12 (twelve) hours.     CoQ-10 200 MG Caps  Take 1 capsule by mouth daily.     dexamethasone 4 MG tablet  Commonly known as:  DECADRON  1 tablet every 6 hours, 3 days, 1 tablet every 8 hours 3 days, 1 tablet every 12 hours 3 days, 1 tablet every day 3 days, half a tablet every day 3 days, then discontinue     famotidine 20 MG tablet   Commonly known as:  PEPCID  Take 1 tablet (20 mg total) by mouth at bedtime.     FIBER SELECT GUMMIES Chew  Chew 2 each by mouth daily.     FLUoxetine 40 MG capsule  Commonly known as:  PROZAC  Take 40 mg by mouth daily.     fluticasone 50 MCG/ACT nasal spray  Commonly known as:  FLONASE  Place 2 sprays into both nostrils 2 (two) times daily.     FLUTTER Devi  Use as directed     gabapentin 100 MG capsule  Commonly known as:  NEURONTIN  Take 1 capsule (100 mg total) by mouth 3 (three) times daily. One three times daily     methylphenidate 10 MG tablet  Commonly known as:  RITALIN  Take 10 mg by mouth 3 (three) times daily.     multivitamin with minerals Tabs tablet  Take 1 tablet by mouth daily.     omeprazole 20 MG capsule  Commonly known as:  PRILOSEC  Take 40 mg by mouth 2 (two) times daily before a meal.     PROAIR HFA 108 (90 Base) MCG/ACT inhaler  Generic drug:  albuterol  Inhale 2 puffs into the lungs every 6 (six)  hours as needed for wheezing or shortness of breath. Reported on 09/02/2015     PROBIOTIC DAILY Caps  Take 1 capsule by mouth daily. Reported on 08/21/2015     simvastatin 20 MG tablet  Commonly known as:  ZOCOR  Take 20 mg by mouth at bedtime.         Discharge Condition: Stable Discharge Instructions       Discharge Instructions    Diet - low sodium heart healthy    Complete by:  As directed      Increase activity slowly    Complete by:  As directed            Allergies  Allergen Reactions  . Ciprofloxacin Hcl Other (See Comments)    Burning in ear when drops were used  . Asa [Aspirin]       Disposition: 01-Home or Self Care   Consults: * Pulmonary **    Significant Diagnostic Studies:  Dg Chest 2 View  09/04/2015  CLINICAL DATA:  Cough, shortness of breath for the past 50 days, history of chronic bronchitis previous tobacco use, previous episodes of pneumonia. EXAM: CHEST  2 VIEW COMPARISON:  Chest x-ray of  September 02, 2015 an chest x-ray dated May 02, 2015. FINDINGS: There are persistent confluent alveolar infiltrates in the upper lobes. These are somewhat more conspicuous today especially on the left. Coarse infrahilar density on the right is more conspicuous today. There is no pleural effusion. The heart and pulmonary vascularity are normal. The mediastinum is normal in width. The bony thorax exhibits no acute abnormality. There is an old fracture of the lateral aspect of the left sixth rib. IMPRESSION: Bilateral upper lobe pneumonia more conspicuous on the left today than previously demonstrated. Infrahilar atelectasis or pneumonia on the right. Electronically Signed   By: David  Martinique M.D.   On: 09/04/2015 12:37   Dg Chest 2 View  09/02/2015  CLINICAL DATA:  Shortness of breath, productive cough. EXAM: CHEST  2 VIEW COMPARISON:  August 26, 2015. FINDINGS: Stable cardiomediastinal silhouette. Old distal left clavicular fracture is noted. No pneumothorax or pleural effusion is noted. Significantly increased bilateral upper lobe airspace opacities are noted concerning for pneumonia or possibly edema. Osteophyte formation is noted in the thoracic spine. IMPRESSION: Significant increased bilateral upper lobe airspace opacities are noted concerning for pneumonia or possibly edema. Follow-up radiographs are recommended. Electronically Signed   By: Marijo Conception, M.D.   On: 09/02/2015 16:51   Dg Chest 2 View  08/27/2015  ADDENDUM REPORT: 08/27/2015 10:03 ADDENDUM: In the conclusion of the report I misspoke and said "left lower lobe atelectasis" when in fact I should have said "right lower lobe atelectasis or early pneumonia". I also misspoke and indicated that "pulmonary nodules demonstrated on the February 27th CT scan.". I should have stated "CT scan of the chest of August 14, 2015.". The remainder of the impression is correct. I apologize for the confusion these errors have has caused. Electronically  Signed   By: David  Martinique M.D.   On: 08/27/2015 10:03  08/27/2015  CLINICAL DATA:  One month of cough and chest congestion, history of pneumonia, mitral valve prolapse, former smoker. EXAM: CHEST  2 VIEW COMPARISON:  Chest x-ray of May 02, 2015 and CT scan of the chest of August 14, 2015. FINDINGS: The lungs are well-expanded. The interstitial markings are chronically increased. However, increased interstitial density in the right infrahilar region posteriorly is present today. There is no air bronchogram. There  is no pleural effusion or parenchymal mass. The trachea is midline. There is mild multilevel degenerative disc disease of the thoracic spine. IMPRESSION: Left lower lobe atelectasis or early pneumonia superimposed upon findings of chronic bronchitis. Followup PA and lateral chest X-ray is recommended in 3-4 weeks following trial of antibiotic therapy to ensure resolution and exclude underlying malignancy. Pulmonary nodules demonstrated on the February 27 CT scan are not clearly evident on this plain film and the previous recommendation for follow-up chest CT scanning in 4-6 months is again recommended. Electronically Signed: By: David  Martinique M.D. On: 08/26/2015 15:45   Ct Angio Chest Pe W/cm &/or Wo Cm  08/14/2015  CLINICAL DATA:  Chronic cough, shortness of breath for 5 days, right posterior chest pain EXAM: CT ANGIOGRAPHY CHEST WITH CONTRAST TECHNIQUE: Multidetector CT imaging of the chest was performed using the standard protocol during bolus administration of intravenous contrast. Multiplanar CT image reconstructions and MIPs were obtained to evaluate the vascular anatomy. CONTRAST:  100 cc Isovue 370 COMPARISON:  Chest x-ray of 05/02/2015, and CT chest of 05/15/2009 Creatinine was obtained on site at [facility] at [address]Results: Creatinine 0.9 mg/dL. GFR of 89. FINDINGS: The pulmonary artery are well opacified and there is no evidence of acute pulmonary embolism. The  thoracic aorta also opacifies with no acute abnormality noted. The origins of the great vessels are patent no mediastinal or hilar adenopathy is seen. No significant coronary artery calcifications are noted. There is no evidence of pericardial effusion. There is a moderate amount of epicardial fat present palpable incidental liver cysts are noted in the left lobe. Bilateral simple appearing renal cysts are present. The thyroid gland is unremarkable. On lung window images mild biapical pleural parenchymal scarring is present. A noncalcified nodule is present near the right lung apex posteriorly on image 11 measuring 6 mm, not definitely seen on the prior CT. There is a poorly defined 6 mm noncalcified nodule in the posterior aspect of the superior segment of the right lower lobe on image 31. Also, this nodule is not seen on the prior CT. A 5 mm noncalcified nodule is present in the left lower lobe laterally on image 39. This nodule was present and has not changed significantly compared to the CT from 2010. No additional pulmonary nodule is seen. In view of a few new lung nodules, a followup CT chest scan in 4-6 months is recommended to assess stability. No parenchymal infiltrate is noted and there is no evidence of pleural effusion. There are degenerative changes throughout the thoracic spine. Review of the MIP images confirms the above findings. IMPRESSION: 1. No evidence of acute pulmonary embolism is seen. 2. There are a few noncalcified lung nodules, not seen on the CT from 2010. Therefore followup CT chest in 4-6 months is recommended to assess stability. Electronically Signed   By: Ivar Drape M.D.   On: 08/14/2015 15:06   Dg Chest Port 1 View  09/09/2015  CLINICAL DATA:  Community-acquired pneumonia, acute respiratory distress, influenza syndrome, former smoker. EXAM: PORTABLE CHEST 1 VIEW COMPARISON:  PA and lateral chest x-ray of September 04, 2015 FINDINGS: The lungs are adequately inflated. Persistent  interstitial density with areas of confluence are noted in the upper lobes. Slight interval improvement has occurred with less involvement of the mid lung zones. There is no pleural effusion. The lung bases are clear. The heart and pulmonary vascularity are normal. The bony thorax is unremarkable. IMPRESSION: Persistent intermittent interstitial and patchy alveolar opacities  in the upper lobes most compatible with pneumonia. There has been some improvement overall since the previous study. Electronically Signed   By: David  Martinique M.D.   On: 09/09/2015 07:25   [place] Cm  08/29/2015  CLINICAL DATA:  Upper airway cough syndrome EXAM: CT PARANASAL SINUS LIMITED WITHOUT CONTRAST TECHNIQUE: Non-contiguous multidetector CT images of the paranasal sinuses were obtained in a single plane without contrast. COMPARISON:  None. FINDINGS: Mild mucosal thickening in the maxillary sinus bilaterally. Right maxillary sinus clear. Mild mucosal edema in the ethmoid sinuses bilaterally. Prior sinus surgery with partial ethmoidectomy and medial antrectomy bilaterally. The frontal and sphenoid with sinuses are clear. No air-fluid level. Mastoid sinus clear. No significant soft tissue abnormality. IMPRESSION: Mild mucosal edema in the ethmoid sinuses and the maxillary sinuses. Remaining sinuses are clear. Prior sinus surgery. Electronically Signed   By: Franchot Gallo M.D.   On: 08/29/2015 13:07     2-D echo LV EF: 55% -  60%  ------------------------------------------------------------------- Indications:   Dyspnea 786.09.  ------------------------------------------------------------------- History:  PMH: Influenza, Former Smoker. Angina pectoris. Risk factors: Dyslipidemia.  ------------------------------------------------------------------- Study Conclusions  - Left ventricle: The cavity size was normal. Wall thickness was increased in a pattern of mild LVH. Systolic function was  normal. The estimated ejection fraction was in the range of 55% to 60%. Wall motion was normal; there were no regional wall motion abnormalities.   Filed Weights   09/04/15 1002 09/04/15 1715  Weight: 68.947 kg (152 lb) 68.493 kg (151 lb)     Microbiology: Recent Results (from the past 240 hour(s))  Respiratory virus panel     Status: Abnormal   Collection Time: 09/04/15  1:53 PM  Result Value Ref Range Status   Source - RVPAN NASOPHARYNGEAL  Corrected   Respiratory Syncytial Virus A Negative Negative Final   Respiratory Syncytial Virus B Negative Negative Final   Influenza A Positive (A) Negative Final    Comment: Subtype: H3   Influenza B Negative Negative Final   Parainfluenza 1 Negative Negative Final   Parainfluenza 2 Negative Negative Final   Parainfluenza 3 Negative Negative Final   Metapneumovirus Negative Negative Final   Rhinovirus Negative Negative Final   Adenovirus Negative Negative Final    Comment: (NOTE) Performed At: [facility] [place], [place] [id number] Lindon Romp MD [id number]   Culture, blood (Routine X 2) w Reflex to ID Panel     Status: None (Preliminary result)   Collection Time: 09/04/15  3:25 PM  Result Value Ref Range Status   Specimen Description BLOOD LEFT ANTECUBITAL  Final   Special Requests BOTTLES DRAWN AEROBIC AND ANAEROBIC 5CC  Final   Culture NO GROWTH 4 DAYS  Final   Report Status PENDING  Incomplete  Culture, blood (Routine X 2) w Reflex to ID Panel     Status: None (Preliminary result)   Collection Time: 09/04/15  3:32 PM  Result Value Ref Range Status   Specimen Description BLOOD LEFT HAND  Final   Special Requests BOTTLES DRAWN AEROBIC ONLY [place]  Final   Culture NO GROWTH 4 DAYS  Final   Report Status PENDING  Incomplete  Culture, sputum-assessment     Status: None   Collection Time: 09/05/15  7:38 PM  Result Value Ref Range Status   Specimen Description SPUTUM  Final   Special Requests  NONE  Final   Sputum evaluation   Final    MICROSCOPIC FINDINGS SUGGEST THAT THIS SPECIMEN IS  NOT REPRESENTATIVE OF LOWER RESPIRATORY SECRETIONS. PLEASE RECOLLECT. Aldine Contes [facility] RN [id number] 09/05/15 A BROWNING    Report Status 09/05/2015 FINAL  Final       Blood Culture    Component Value Date/Time   SDES SPUTUM 09/05/2015 1938   SPECREQUEST NONE 09/05/2015 1938   CULT NO GROWTH 4 DAYS 09/04/2015 1532   REPTSTATUS 09/05/2015 FINAL 09/05/2015 1938      Labs: Results for orders placed or performed during the hospital encounter of 09/04/15 (from the past 48 hour(s))  Sedimentation rate     Status: Abnormal   Collection Time: 09/08/15  5:17 AM  Result Value Ref Range   Sed Rate 70 (H) 0 - 16 mm/hr  CBC     Status: Abnormal   Collection Time: 09/08/15  5:17 AM  Result Value Ref Range   WBC 13.7 (H) 4.0 - 10.5 K/uL   RBC 3.51 (L) 4.22 - 5.81 MIL/uL   Hemoglobin 10.7 (L) 13.0 - 17.0 g/dL   HCT 32.2 (L) 39.0 - 52.0 %   MCV 91.7 78.0 - 100.0 fL   MCH 30.5 26.0 - 34.0 pg   MCHC 33.2 30.0 - 36.0 g/dL   RDW 12.9 11.5 - 15.5 %   Platelets 532 (H) 150 - 400 K/uL     Lipid Panel  No results found for: CHOL, TRIG, HDL, CHOLHDL, VLDL, LDLCALC, LDLDIRECT   Lab Results  Component Value Date   HGBA1C 6.0* 02/15/2013     Lab Results  Component Value Date   CREATININE 0.82 09/07/2015     65 year old male patient has a history of GERD and Barrett's esophagitis, depression and ADD on medications, dyslipidemia, history of hyperparathyroidism status post parathyroidectomy,upper airway cough syndrome followed by Dr. Melvyn Novas thought r/t GERD. Previous w/u in 2009 by Dr. [name] with normal PFT's., prior hospitalization for ileus with small bowel obstruction, and history of left hip fracture with total hip replacement. According to the patient and his wife over the past 52 days he has had issues with chronic paroxysmal coughing that is worse when he lays backwards. At times this is  associated with shortness of breath and/or dyspnea on exertion. He's had multiple outpatient evaluations: cardiology, GI and pulmonary medicine. It was felt the patient had reflux mediated upper airway cough syndrome and he has been maintained on breakfast and lunch Prilosec and bedtime Pepcid. About 2 weeks ago he had bronchitis-type symptomatology and was prescribed Bactrim by the pulmonologist. He returned on 2/28 and chest x-ray revealed bilateral upper lobe pneumonia and patient was started on Levaquin on 3/1 of which he took one dose. He reports worsening shortness of breath and dyspnea on exertion and nonproductive cough. He is now having fevers with MAXIMUM TEMPERATURE is up to 102 Patient also found to be positive for influenza A.PCCM consulted 3/4 for ongoing dyspnea and cough Quit smoking 1989.  Assessment and plan Acute respiratory distress/CAP without sepsis, acute on chronic COPD exacerbation -Presents with progressive shortness of breath worsened baseline and associated fevers and abnormal chest x-ray concerning for pneumonia  blood culture no growth , Initially started on treatment with Rocephin azithromycin, Tamiflu 5 days, now on Keflex 5 days Patient qualifies for home oxygen   Negative urinary strep and Legionella , blood culture no growth so far -Influenza PCR and respiratory viral panel **POSITIVE for Flu A  Pulmonary consulted and following, patient highly concerned about lung, cancer, has had a 40 pound unintentional weight loss in 5 years  Started on flutter  valve, consider repeat CT scan of the chest if no better otherwise repeat CT scan in 4-6 months Patient seen by pulmonary, maximized on ppi, H1 blocker, cough suppression, Wean off steroids, flow outpatient Decadron taper. Cont pulmicort. Cont duoneb qid. Cont flutter valve.  Okay to discharge today from a pulmonary standpoint  Upper airway cough syndrome/G E R D/Barrett's esophagus -Has undergone extensive  workup as an outpatient with pulmonology -Continue PPI and H2 blockers as prior to admission -Reports recent EGD without significant issues -Recent outpatient CT of the chest unremarkable except for several noncalcified lung nodules with recommendations for follow-up CT in 4-6 months (study was ordered by outpatient pulmonologist) Autoimmune workup including rheumatoid arthritis, lupus ( daughter has lupus), positive rheumatoid factor, anti-ccp Negative No Indication for bronchoscopy  as this would further irritate the vocal cords    Dyspnea on exertion -Patient reports was evaluated by cardiology and underwent stress test without any significant findings ([place]) 2-D echo shows an EF of 55-60% without any regional wall motion abnormalities    HLD  -Continue preadmission medication   Depression/ADD  -Continue preadmission medications   Hyperparathyroidism, primary, s/p MI parathyroidectomy 7/18, 3.1 gm adenoma     Discharge Exam:   Blood pressure 90/48, pulse 70, temperature 97.8 F (36.6 C), temperature source Oral, resp. rate 18, height 6' (1.829 m), weight 68.493 kg (151 lb), SpO2 97 %.  General: Chronically ill appearing male, NAD  Neuro: Awake, alert, appropriate, MAE HEENT: Mm moist, no JVD. Some upper airway turbulence  Cardiovascular: s1s2  Lungs:clear and w/out accessory use  Abdomen: Round, soft, +BS  Musculoskeletal: Warm and dry, no edema    Follow-up Information    Follow up with [facility].   Why:  3 in 1, and rolling walker to be delivered to room prior to discharge   Contact information:   [address] [place] [address] [phone]       Follow up with [facility].   Why:  RN PT OT HHA. Will call in 1-2 days after discharge to set up first home care appointment   Contact information:   [address] [place] [address] [phone]       Follow up with Lilian Coma,  MD. Schedule an appointment as soon as possible for a visit in 3 days.   Specialty:  Family Medicine   Contact information:   [address] [place] [address] [phone]       Follow up with Christinia Gully, MD. Schedule an appointment as soon as possible for a visit in 3 days.   Specialty:  Pulmonary Disease   Contact information:   [address] [address] [phone]       Signed: Reyne Dumas 09/09/2015, 11:46 AM        Time spent >45 mins

## 2015-09-09 NOTE — Progress Notes (Signed)
Name: [name] MRN: [id number] DOB: December 19, 1950    ADMISSION DATE:  09/04/2015 CONSULTATION DATE:  3/4  REFERRING MD :  abrol ([facility])   CHIEF COMPLAINT:  Cough   BRIEF PATIENT DESCRIPTION: 65yo male with hx mitral valve prolapse, GERD.barretts esophagus, hiatal hernia, upper airway cough syndrome followed by Dr. Melvyn Novas thought r/t GERD.  Previous w/u in 2009 by Dr. [name] with normal PFT's.  Presented 3/2 with fever, worsening cough and dyspnea.  Pt was POS for Flu A. Admitted by [facility] with bilat upper lobe PNA and treated with IV rocephin, azithro and tamiflu.  PCCM consulted 3/4 for ongoing dyspnea and cough and newly POS RF.    SIGNIFICANT EVENTS   STUDIES:   HISTORY OF PRESENT ILLNESS:  65yo male with hx mitral valve prolapse, GERD, hiatal hernia, upper airway cough syndrome followed by Dr. Melvyn Novas thought r/t GERD.  Previous w/u in 2009 by Dr. [name] with normal PFT's.  Presented 3/2 with fever, worsening cough and dyspnea.  Pt was POS for Flu A. Admitted by [facility] with bilat upper lobe PNA and treated with IV rocephin, azithro and tamiflu.  PCCM consulted 3/4 for ongoing dyspnea and cough.    Daughter has SLE.  Pt denies joint pain/swelling, rash.  Quit smoking 1989.     SUBJECTIVE: about same, cough persistent   VITAL SIGNS: Temp:  [97.8 F (36.6 C)-98.2 F (36.8 C)] 97.8 F (36.6 C) (03/06 2229) Pulse Rate:  [64-79] 70 (03/07 0508) Resp:  [18-20] 18 (03/07 0508) BP: (86-94)/(42-49) 90/48 mmHg (03/07 0508) SpO2:  [86 %-97 %] 97 % (03/07 0508)  PHYSICAL EXAMINATION: General:  Chronically ill appearing male, NAD  Neuro:  Awake, alert, appropriate, MAE HEENT:  Mm moist, no JVD. Some upper airway turbulence  Cardiovascular:  s1s2  Lungs:clear and w/out accessory use  Abdomen:  Round, soft, +BS  Musculoskeletal:  Warm and dry, no edema   Recent Labs Lab 09/05/15 0746 09/06/15 0625 09/07/15 0701  NA 138 135 140  K 4.0 4.2 4.0  CL 106 102 99*  CO2 _0 BUN 12 15  22*  CREATININE 0.89 0.70 0.82  GLUCOSE 90 137* 133*    Recent Labs Lab 09/06/15 0625 09/07/15 0701 09/08/15 0517  HGB 10.3* 11.2* 10.7*  HCT 32.5* 33.4* 32.2*  WBC 9.4 15.3* 13.7*  PLT 450* 514* 532*   Dg Chest Port 1 View  09/09/2015  CLINICAL DATA:  Community-acquired pneumonia, acute respiratory distress, influenza syndrome, former smoker. EXAM: PORTABLE CHEST 1 VIEW COMPARISON:  PA and lateral chest x-ray of September 04, 2015 FINDINGS: The lungs are adequately inflated. Persistent interstitial density with areas of confluence are noted in the upper lobes. Slight interval improvement has occurred with less involvement of the mid lung zones. There is no pleural effusion. The lung bases are clear. The heart and pulmonary vascularity are normal. The bony thorax is unremarkable. IMPRESSION: Persistent intermittent interstitial and patchy alveolar opacities in the upper lobes most compatible with pneumonia. There has been some improvement overall since the previous study. Electronically Signed   By: David  Martinique M.D.   On: 09/09/2015 07:25  slight improvement in bilateral patchy infiltrates   ASSESSMENT / PLAN:  CAP Upper airway cough syndrome -- ongoing issue at baseline.  Followed closely by pulm and GI, recent EGD wnl Flu A w/ patchy bilateral pulmonary infiltrates.  GERD Clear vocal cord irritation  RF = 36.8 Dsdna = neg Anti-scl-70 = neg Esr 70 ccp neg  PLAN -  Cont abx per primary  Cont tamiflu  Max PPI, H2 as per home rx  Add tussionex as has H1 blocker & cough suppression via narcotic  Add nasal saline  Cont decadron  Cough suppression  No role for FOB at this point however IF it continues after infiltrates resolved we could consider an airway exam as out pt   Erick Colace ACNP-BC [place] Pager # [phone] OR # [phone] if no answer   STAFF NOTE: I, Merrie Roof, MD FACP have personally reviewed patient's available data, including  medical history, events of note, physical examination and test results as part of my evaluation. I have discussed with resident/NP and other care providers such as pharmacist, RN and RRT. In addition, I personally evaluated patient and elicited key findings of: less hoarseness, no stridor, moving air better through cords, clear lungs today, he seemingly did well with decadron, can reduce slowly and see Dr Melvyn Novas in 1 wee to assess trapper fruther, as far as bronch, not indicated , he had a screening CT chest feb 9th, will need follow up in 4 months, bronch would harm and irritate cords and take steps backwards,  likley also as flu resolved , cough will improve, further treatment from Dr Melvyn Novas  Will sign off, call if needed  Lavon Paganini. Titus Mould, MD, [place] Pgr: [facility] 09/09/2015 10:55 AM

## 2015-09-09 NOTE — Progress Notes (Signed)
[name] to be D/C'd Home per MD order.  Discussed with the patient and all questions fully answered.  VSS, Skin clean, dry and intact without evidence of skin break down, no evidence of skin tears noted. IV catheter discontinued intact. Site without signs and symptoms of complications. Dressing and pressure applied.  An After Visit Summary was printed and given to the patient. Patient received prescription.  D/c education completed with patient/family including follow up instructions, medication list, d/c activities limitations if indicated, with other d/c instructions as indicated by MD - patient able to verbalize understanding, all questions fully answered.   Patient instructed to return to ED, call 911, or call MD for any changes in condition.   Patient to be escorted via [place], and D/C home via private auto.  Home oxygen sent with patient.   L'ESPERANCE, Hulet Ehrmann C 09/09/2015 12:13 PM

## 2015-09-10 ENCOUNTER — Telehealth: Payer: Self-pay | Admitting: Internal Medicine

## 2015-09-10 DIAGNOSIS — K219 Gastro-esophageal reflux disease without esophagitis: Secondary | ICD-10-CM | POA: Diagnosis not present

## 2015-09-10 DIAGNOSIS — J188 Other pneumonia, unspecified organism: Secondary | ICD-10-CM | POA: Diagnosis not present

## 2015-09-10 DIAGNOSIS — K2270 Barrett's esophagus without dysplasia: Secondary | ICD-10-CM | POA: Diagnosis not present

## 2015-09-10 DIAGNOSIS — Z87891 Personal history of nicotine dependence: Secondary | ICD-10-CM | POA: Diagnosis not present

## 2015-09-10 DIAGNOSIS — Z9981 Dependence on supplemental oxygen: Secondary | ICD-10-CM | POA: Diagnosis not present

## 2015-09-10 DIAGNOSIS — E785 Hyperlipidemia, unspecified: Secondary | ICD-10-CM | POA: Diagnosis not present

## 2015-09-10 DIAGNOSIS — Z96642 Presence of left artificial hip joint: Secondary | ICD-10-CM | POA: Diagnosis not present

## 2015-09-10 DIAGNOSIS — J09X1 Influenza due to identified novel influenza A virus with pneumonia: Secondary | ICD-10-CM | POA: Diagnosis not present

## 2015-09-10 NOTE — Telephone Encounter (Signed)
Patient's wife notified. Scheduled appointment for patient to come in Friday 09/12/15 for follow up with all meds. Nothing further needed.

## 2015-09-10 NOTE — Telephone Encounter (Signed)
Patients wife calling to get appointment with Dr. Melvyn Novas in 1 week.  Hospital Follow up.  Patient was sent home from hospital yesterday with 2L O2 from hospital.  Still struggling with SOB. While I was talking on the phone with wife, I could hear patient in background coughing non-stop, dry, heaving cough.  Wife says patient is still very weak still.  Also, patient was put on Budesonide neb, but it is too expensive (over $770), needs a cheaper medication for Nebulizer.  --------------------------------------------------------------------------------------------------------- Discharge Summary from 09/09/15  Assessment and plan Acute respiratory distress/CAP without sepsis, acute on chronic COPD exacerbation -Presents with progressive shortness of breath worsened baseline and associated fevers and abnormal chest x-ray concerning for pneumonia blood culture no growth , Initially started on treatment with Rocephin azithromycin, Tamiflu 5 days, now on Keflex 5 days Patient qualifies for home oxygen  Negative urinary strep and Legionella , blood culture no growth so far -Influenza PCR and respiratory viral panel **POSITIVE for Flu A  Pulmonary consulted and following, patient highly concerned about lung, cancer, has had a 40 pound unintentional weight loss in 5 years  Started on flutter valve, consider repeat CT scan of the chest if no better otherwise repeat CT scan in 4-6 months Patient seen by pulmonary, maximized on ppi, H1 blocker, cough suppression, Wean off steroids, flow outpatient Decadron taper. Cont pulmicort. Cont duoneb qid. Cont flutter valve.  Okay to discharge today from a pulmonary standpoint  Upper airway cough syndrome/G E R D/Barrett's esophagus -Has undergone extensive workup as an outpatient with pulmonology -Continue PPI and H2 blockers as prior to admission -Reports recent EGD without significant issues -Recent outpatient CT of the chest unremarkable except for several  noncalcified lung nodules with recommendations for follow-up CT in 4-6 months (study was ordered by outpatient pulmonologist) Autoimmune workup including rheumatoid arthritis, lupus ( daughter has lupus), positive rheumatoid factor, anti-ccp Negative No Indication for bronchoscopy as this would further irritate the vocal cords    Dyspnea on exertion -Patient reports was evaluated by cardiology and underwent stress test without any significant findings ([facility]) 2-D echo shows an EF of 55-60% without any regional wall motion abnormalities --------------------------------------------------------------------------------------------------------------------------  Last OV note from 09/02/15  Patient Instructions     Please remember to go to the x-ray department downstairs for your tests - we will call you with the results when they are available.  Avoid speaking/ sleep in recliner to prevent coughing fits and use the flutter on max resistance as much as possible  Prilosec 20 mg x 2 Take 30- 60 min before your first and last meals of the day and Pepcid ac 20 mg at bedtime  For drainage / throat tickle try take CHLORPHENIRAMINE 4 mg - take one every 4 hours as needed - available over the counter- may cause drowsiness so start with just a bedtime dose or two and see how you tolerate it before trying in daytime   Depomedrol 120 mg IM today   If not better w/in a few days > gabapentin 100 mg three times a day with meals

## 2015-09-10 NOTE — Telephone Encounter (Signed)
Ok to leave off budesonide -no cheaper substitutes available Will need ov with all meds in hand and neb solutions next available.

## 2015-09-11 DIAGNOSIS — Z87891 Personal history of nicotine dependence: Secondary | ICD-10-CM | POA: Diagnosis not present

## 2015-09-11 DIAGNOSIS — J188 Other pneumonia, unspecified organism: Secondary | ICD-10-CM | POA: Diagnosis not present

## 2015-09-11 DIAGNOSIS — J09X1 Influenza due to identified novel influenza A virus with pneumonia: Secondary | ICD-10-CM | POA: Diagnosis not present

## 2015-09-11 DIAGNOSIS — E785 Hyperlipidemia, unspecified: Secondary | ICD-10-CM | POA: Diagnosis not present

## 2015-09-11 DIAGNOSIS — K2270 Barrett's esophagus without dysplasia: Secondary | ICD-10-CM | POA: Diagnosis not present

## 2015-09-11 DIAGNOSIS — K219 Gastro-esophageal reflux disease without esophagitis: Secondary | ICD-10-CM | POA: Diagnosis not present

## 2015-09-12 ENCOUNTER — Encounter: Payer: Self-pay | Admitting: Internal Medicine

## 2015-09-12 ENCOUNTER — Ambulatory Visit (INDEPENDENT_AMBULATORY_CARE_PROVIDER_SITE_OTHER): Payer: Medicare Other | Admitting: Internal Medicine

## 2015-09-12 VITALS — BP 108/62 | HR 89 | Ht 72.0 in | Wt 150.4 lb

## 2015-09-12 DIAGNOSIS — J9611 Chronic respiratory failure with hypoxia: Secondary | ICD-10-CM

## 2015-09-12 DIAGNOSIS — R05 Cough: Secondary | ICD-10-CM | POA: Diagnosis not present

## 2015-09-12 DIAGNOSIS — J189 Pneumonia, unspecified organism: Secondary | ICD-10-CM | POA: Diagnosis not present

## 2015-09-12 DIAGNOSIS — R058 Other specified cough: Secondary | ICD-10-CM

## 2015-09-12 MED ORDER — ACETAMINOPHEN-CODEINE #3 300-30 MG PO TABS: ORAL_TABLET | ORAL | Status: DC

## 2015-09-12 MED ORDER — CLOTRIMAZOLE 10 MG MT TROC: 10.0000 mg | Freq: Every day | OROMUCOSAL | Status: DC

## 2015-09-12 NOTE — Progress Notes (Signed)
Subjective:    Patient ID: [name], male    DOB: 1950/09/09,     MRN: [id number]    Brief patient profile:  37 yowm quit smoking in 1989 with pattern of pattern of bad cough esp with colds no change before quit or after eval by allergist in [name] referred to pulmonary clinic 08/21/2015 by [name] with "the worst ever cough " = lasted longer / more severe.  Prev w/u by [name] in 2009 with nl pfts.     History of Present Illness  08/21/2015 1st [facility] office visit/ [name]   Chief Complaint  Patient presents with  . Pulmonary Consult    Referred by [name]. Pt c/o cough x 1 month- prod with clear, sticky sputum. He has noticed cough is trigerred by talking and exertion. He states he "goes into spasms" 25-30 x per day. He states he has coughed until almost vomits.   usual spells x lifetime  Last  only a few weeks /this did not feel like a typical cold  At onset one month prior to OV   maint on prilosec 20 mg bid pre- onset  Already 4 abx / pred/ no better  Better at hs/ worse with talking  Sometimes ant cp diffuse fleeting p severe cough  rec The key to effective treatment for your cough is eliminating the non-stop cycle of cough you're stuck in long enough to let your airway heal completely and then see if there is anything still making you cough once you stop the cough suppression, but this should take no more than 5 days to figure out First take delsym two tsp every 12 hours and supplement if needed with  Tylenol #3  up to 1-2 every 4 hours to suppress the urge to cough at all or even clear your throat.   Once you have eliminated the cough for 3 straight days try reducing the Tylenol #3 first,  then the delsym as tolerated.   Prednisone 10 mg take  4 each am x 2 days,   2 each am x 2 days,  1 each am x 2 days and stop (this is to eliminate allergies and inflammation from coughing) Whenever coughing > prilosec 20 x 2 x 30 min x before bfast and supper - once  you are better x one week then change back to previous dose    GERD diet  Always cough into the flutter valve to prevent airway trauma    08/26/2015 acute extended ov/[name] re: persistent severe cough/ finishing bactrim rx today  Chief Complaint  Patient presents with  . Acute Visit    Pt states woke up with bruise on his left side on 08/25/14- does not recall any injury. He feels pain underneath the left arm. He states that he is having longer periods of time without cough, but cough is more severe when he does cough. He also c/o insomnia since last here.   never took more than one tylenol #3 - cough remains non productive/ denies excess/ purulent sputum or mucus plugs   Extensive bruising L chest wall with pain in L axilla with coughing  rec First take delsym two tsp every 12 hours and supplement if needed with  Tylenol #3  up to 1-2 every 4 hours to suppress the urge to cough at all or even clear your throat. Swallowing water or using ice chips/non mint and menthol containing candies (such as lifesavers or sugarless jolly ranchers) are  also effective.  You should rest your voice and avoid activities that you know make you cough. Once you have eliminated the cough for 3 straight days try reducing the Tylenol #3 first,  then the delsym as tolerated.   Whenever coughing > prilosec 20 x 2 x 30 min x before bfast and supper - once you are better x one week then change back to previous dose    GERD  Always cough into the flutter valve to prevent airway trauma  Xanax 0.5 mg at bedtime as needed  Sinus CT  > neg     09/02/2015  f/u ov/[name] re: cough since jan 1/ not resp to tylenol #3 x 2 q4, not taking ppi as rec  Chief Complaint  Patient presents with  . Follow-up    Pt c/o continued cough with clear mucus that is more present when pt is in any kind of reclining position. Pt denies wheeze/CP/tightness. Pt reports that once he starts coughing he begins to have "spasms" that last several minutes  and often require sips of water and hard candy to resolve. Pt is very concerned and per wife is getting depressed. Pt also c/o frequents "sweats" and low grade fevers.   sense of something stuck at suprasternal notch and if just coughs hard enough it will come up / cp resolved  New night sweats since 2/26 when temp of 100 per wife but no fever since. rec Please remember to go to the   x-ray department downstairs for your tests - we will call you with the results when they are available. Avoid speaking/ sleep in recliner to prevent coughing fits and use the flutter on max resistance as much as possible Prilosec 20 mg x 2 Take 30- 60 min before your first and last meals of the day and Pepcid ac 20 mg at bedtime For drainage / throat tickle try take CHLORPHENIRAMINE  4 mg - take one every 4 hours as needed - available over the counter- may cause drowsiness so start with just a bedtime dose or two and see how you tolerate it before trying in daytime   Depomedrol 120 mg IM today  If not better w/in a few days > gabapentin 100 mg three times a day with meals  Add cxr c/w pna > rx levaquin    Admit date: 09/04/2015 Discharge date: 09/09/2015  Principal Problem:  CAP (community acquired pneumonia) Active Problems:  HLD (hyperlipidemia)  Depression  ADD (attention deficit disorder)  G E R D  Hyperparathyroidism, primary, s/p MI parathyroidectomy 7/18, 3.1 gm adenoma  Upper airway cough syndrome  Acute respiratory distress (HCC)  Barrett's esophagus  Dyspnea on exertion  Influenza  Cough    09/12/2015  Post hosp f/u ov/[name] re:  Influenza pna/ cough since Jan 2017 /02 dep since d/c on neurontin 100 tid  Chief Complaint  Patient presents with  . Follow-up    Pt c/o continued SOB and labored breathing, still some cough - which improved while in hospital - but is now coming back some, and some possible thrush mouth/tongue. Pt denies CP/tightness. Pt is on O2 more frequently and would  like to discuss getting a POC.    Still feels sensation of too much throat mucus but no excess production/ just dry hacking - all fever/ sweats resolved  Was d/c on pulmicort neb but too expendsive so did not purchase   No obvious day to day or daytime variability or assoc sob or excess/ purulent sputum or  mucus plugs  chest tightness, subjective wheeze or overt sinus or hb symptoms. No unusual exp hx or h/o childhood pna/ asthma or knowledge of premature birth.  Sleeping ok without nocturnal  or early am exacerbation  of respiratory  c/o's or need for noct saba. Also denies any obvious fluctuation of symptoms with weather or environmental changes or other aggravating or alleviating factors except as outlined above   Current Medications, Allergies, Complete Past Medical History, Past Surgical History, Family History, and Social History were reviewed in [employer] record.  ROS  The following are not active complaints unless bolded sore throat, dysphagia, dental problems, itching, sneezing,  nasal congestion or excess/ purulent secretions, ear ache,   fever, chills, sweats, unintended wt loss, classically pleuritic or exertional cp, hemoptysis,  orthopnea pnd or leg swelling, presyncope, palpitations, abdominal pain, anorexia, nausea, vomiting, diarrhea  or change in bowel or bladder habits, change in stools or urine, dysuria,hematuria,  rash, arthralgias, visual complaints, headache, numbness, weakness or ataxia or problems with walking or coordination,  change in mood/affect or memory.            Objective:   Physical Exam  Elderly wm >> stated age/ w/c bound   09/02/2015        153  > 09/12/2015  150  08/26/2015       155   08/21/15 156 lb 3.2 oz (70.852 kg)  05/26/15 162 lb 5 oz (73.624 kg)  05/13/15 163 lb 3.2 oz (74.027 kg)    Vital signs reviewed   HEENT: nl dentition, turbinates, - mod oral thrush -  Nl external ear canals without cough reflex   NECK :   without JVD/Nodes/TM/ nl carotid upstrokes bilaterally   LUNGS: no acc muscle use,  Nl contour chest which is clear to A and P bilaterally without cough on insp or exp maneuvers   CV:  RRR  no s3 or murmur or increase in P2, no edema   ABD:  soft and nontender with nl inspiratory excursion in the supine position. No bruits or organomegaly, bowel sounds nl  MS:  Nl gait/ ext warm without deformities, calf tenderness, cyanosis or clubbing No obvious joint restrictions   SKIN: warm and dry with no lesions   NEURO:  alert, approp, nl sensorium with  no motor deficits     I personally reviewed images and agree with radiology impression as follows:  pCXR:  09/09/15 Persistent intermittent interstitial and patchy alveolar opacities in the upper lobes most compatible with pneumonia. There has been some improvement overall since the previous study.                 Assessment & Plan:

## 2015-09-12 NOTE — Patient Instructions (Addendum)
Clotrimazole troche 10 mg four x daily x 3 days and  Stop (should heal your tongue )   Stop tussionex to see if bladder function better   02 is 2lpm bedtime only   Take delsym two tsp every 12 hours with flutter and supplement if needed with  Tylenol #3  up to 2 every 4 hours to suppress the urge to cough. Swallowing water or using ice chips/non mint and menthol containing candies (such as lifesavers or sugarless jolly ranchers) are also effective.  You should rest your voice and avoid activities that you know make you cough.  Once you have eliminated the cough for 3 straight days try reducing the Tylenol #3 first,  then the delsym as tolerated.  See Tammy NP w/in 2 weeks(or first available after that)  with all your medications, even over the counter meds, separated in two separate bags, the ones you take no matter what vs the ones you stop once you feel better and take only as needed when you feel you need them.   Tammy  will generate for you a new user friendly medication calendar that will put [place] all on the same page re: your medication use.     Without this process, it simply isn't possible to assure that we are providing  your outpatient care  with  the attention to detail we feel you deserve.   If we cannot assure that you're getting that kind of care,  then we cannot manage your problem effectively from this clinic.  Once you have seen Tammy and we are sure that we're all on the same page with your medication use she will arrange follow up with me.

## 2015-09-14 ENCOUNTER — Encounter: Payer: Self-pay | Admitting: Internal Medicine

## 2015-09-14 DIAGNOSIS — J9611 Chronic respiratory failure with hypoxia: Secondary | ICD-10-CM | POA: Insufficient documentation

## 2015-09-14 NOTE — Assessment & Plan Note (Signed)
09/12/2015  Walked RA x 3 laps @ 185 ft each stopped due to  End of study, slow  pace, no sob or desat     rec as of 09/12/2015 02 2lpm hs only

## 2015-09-14 NOTE — Assessment & Plan Note (Signed)
Cyclical cough rx  [id number] >>>  - CT sinus 08/29/2015 neg  - neuorontin added 09/02/15   I had an extended discussion with the patient and wife reviewing all relevant studies(including admit notes/ findings/ pulmonary inpt eval) completed to date and  lasting 25 minutes of a 40 minute transition of care office  visit    Again Explained the natural history of cyclical cough and why it's necessary in patients at risk to treat GERD aggressively - at least  short term -   to reduce risk of evolving cyclical cough initially  triggered by epithelial injury and a heightened sensitivty to the effects of any upper airway irritants,  most importantly acid - related - then perpetuated by epithelial injury related to the cough itself as the upper airway collapses on itself.  That is, the more sensitive the epithelium becomes once it is damaged by the virus, the more the ensuing irritability> the more the cough, the more the secondary reflux (especially in those prone to reflux) the more the irritation of the sensitive mucosa and so on in a  Classic cyclical pattern.  Part of this treatment is to completely stop all cough/ throat clearing which he has yet to accomplish.  Each maintenance medication was reviewed in detail including most importantly the difference between maintenance and prns and under what circumstances the prns are to be triggered using an action plan format that is not reflected in the computer generated alphabetically organized AVS.    Please see instructions for details which were reviewed in writing and the patient given a copy highlighting the part that I personally wrote and discussed at today's ov.

## 2015-09-14 NOTE — Assessment & Plan Note (Signed)
See cxr 09/02/2015 > rx levaquin 500 mg x 7 days > admit with influenza   F/u cxr next ov/ no further abx

## 2015-09-15 DIAGNOSIS — J189 Pneumonia, unspecified organism: Secondary | ICD-10-CM | POA: Diagnosis not present

## 2015-09-15 DIAGNOSIS — I951 Orthostatic hypotension: Secondary | ICD-10-CM | POA: Diagnosis not present

## 2015-09-15 DIAGNOSIS — F900 Attention-deficit hyperactivity disorder, predominantly inattentive type: Secondary | ICD-10-CM | POA: Diagnosis not present

## 2015-09-16 ENCOUNTER — Ambulatory Visit
Admission: RE | Admit: 2015-09-16 | Discharge: 2015-09-16 | Disposition: A | Discharge status: Home / Self Care | Payer: Medicare Other | Source: Ambulatory Visit | Attending: Internal Medicine | Admitting: Internal Medicine

## 2015-09-16 DIAGNOSIS — J188 Other pneumonia, unspecified organism: Secondary | ICD-10-CM | POA: Diagnosis not present

## 2015-09-16 DIAGNOSIS — E785 Hyperlipidemia, unspecified: Secondary | ICD-10-CM | POA: Diagnosis not present

## 2015-09-16 DIAGNOSIS — K219 Gastro-esophageal reflux disease without esophagitis: Secondary | ICD-10-CM | POA: Diagnosis not present

## 2015-09-16 DIAGNOSIS — J189 Pneumonia, unspecified organism: Secondary | ICD-10-CM

## 2015-09-16 DIAGNOSIS — K2270 Barrett's esophagus without dysplasia: Secondary | ICD-10-CM | POA: Diagnosis not present

## 2015-09-16 DIAGNOSIS — J09X1 Influenza due to identified novel influenza A virus with pneumonia: Secondary | ICD-10-CM | POA: Diagnosis not present

## 2015-09-16 DIAGNOSIS — Z87891 Personal history of nicotine dependence: Secondary | ICD-10-CM | POA: Diagnosis not present

## 2015-09-17 DIAGNOSIS — K219 Gastro-esophageal reflux disease without esophagitis: Secondary | ICD-10-CM | POA: Diagnosis not present

## 2015-09-17 DIAGNOSIS — K2270 Barrett's esophagus without dysplasia: Secondary | ICD-10-CM | POA: Diagnosis not present

## 2015-09-17 DIAGNOSIS — Z87891 Personal history of nicotine dependence: Secondary | ICD-10-CM | POA: Diagnosis not present

## 2015-09-17 DIAGNOSIS — E785 Hyperlipidemia, unspecified: Secondary | ICD-10-CM | POA: Diagnosis not present

## 2015-09-17 DIAGNOSIS — J188 Other pneumonia, unspecified organism: Secondary | ICD-10-CM | POA: Diagnosis not present

## 2015-09-17 DIAGNOSIS — J09X1 Influenza due to identified novel influenza A virus with pneumonia: Secondary | ICD-10-CM | POA: Diagnosis not present

## 2015-09-18 ENCOUNTER — Telehealth: Payer: Self-pay | Admitting: Internal Medicine

## 2015-09-18 DIAGNOSIS — K149 Disease of tongue, unspecified: Secondary | ICD-10-CM

## 2015-09-18 DIAGNOSIS — R070 Pain in throat: Secondary | ICD-10-CM

## 2015-09-18 DIAGNOSIS — J9611 Chronic respiratory failure with hypoxia: Secondary | ICD-10-CM

## 2015-09-18 NOTE — Telephone Encounter (Signed)
Oxygen D/C order sent to [facility]. Nothing further needed.

## 2015-09-18 NOTE — Telephone Encounter (Signed)
Patients wife notified that patient needs ENT eval. She says that patient does not need oxygen any longer and requests that we D/C all oxygen. DME: [facility]  Dr. Melvyn Novas, ok to D/C oxygen? Please advise.

## 2015-09-18 NOTE — Telephone Encounter (Signed)
Will need ent eval next or see his primary

## 2015-09-18 NOTE — Telephone Encounter (Signed)
That's fine

## 2015-09-18 NOTE — Telephone Encounter (Signed)
Per 09/12/15 OV: Patient Instructions       Clotrimazole troche 10 mg four x daily x 3 days and  Stop (should heal your tongue )   Stop tussionex to see if bladder function better   02 is 2lpm bedtime only    Take delsym two tsp every 12 hours with flutter and supplement if needed with  Tylenol #3  up to 2 every 4 hours to suppress the urge to cough. Swallowing water or using ice chips/non mint and menthol containing candies (such as lifesavers or sugarless jolly ranchers) are also effective.  You should rest your voice and avoid activities that you know make you cough. Once you have eliminated the cough for 3 straight days try reducing the Tylenol #3 first,  then the delsym as tolerated.  See Tammy NP w/in 2 weeks(or first available after that)  with all your medications, even over the counter meds, separated in two separate bags, the ones you take no matter what vs the ones you stop once you feel better and take only as needed when you feel you need them.   Tammy  will generate for you a new user friendly medication calendar that will put [place] all on the same page re: your medication use.   Without this process, it simply isn't possible to assure that we are providing  your outpatient care  with  the attention to detail we feel you deserve.   If we cannot assure that you're getting that kind of care,  then we cannot manage your problem effectively from this clinic. Once you have seen Tammy and we are sure that we're all on the same page with your medication use she will arrange follow up with me.  -----   Called spoke with pt. He reports his tongue is still very sore, throat now sore, tongue feels thick. He was given mycelex troche on 09/12/15 x 2 refills but reports he has been through these refills and still having symptoms. Requesting recs. Please advise MW thanks

## 2015-09-19 DIAGNOSIS — J09X1 Influenza due to identified novel influenza A virus with pneumonia: Secondary | ICD-10-CM | POA: Diagnosis not present

## 2015-09-19 DIAGNOSIS — Z87891 Personal history of nicotine dependence: Secondary | ICD-10-CM | POA: Diagnosis not present

## 2015-09-19 DIAGNOSIS — K2270 Barrett's esophagus without dysplasia: Secondary | ICD-10-CM | POA: Diagnosis not present

## 2015-09-19 DIAGNOSIS — J188 Other pneumonia, unspecified organism: Secondary | ICD-10-CM | POA: Diagnosis not present

## 2015-09-19 DIAGNOSIS — K219 Gastro-esophageal reflux disease without esophagitis: Secondary | ICD-10-CM | POA: Diagnosis not present

## 2015-09-19 DIAGNOSIS — E785 Hyperlipidemia, unspecified: Secondary | ICD-10-CM | POA: Diagnosis not present

## 2015-09-22 DIAGNOSIS — Z87891 Personal history of nicotine dependence: Secondary | ICD-10-CM | POA: Diagnosis not present

## 2015-09-22 DIAGNOSIS — E785 Hyperlipidemia, unspecified: Secondary | ICD-10-CM | POA: Diagnosis not present

## 2015-09-22 DIAGNOSIS — K219 Gastro-esophageal reflux disease without esophagitis: Secondary | ICD-10-CM | POA: Diagnosis not present

## 2015-09-22 DIAGNOSIS — K2270 Barrett's esophagus without dysplasia: Secondary | ICD-10-CM | POA: Diagnosis not present

## 2015-09-22 DIAGNOSIS — J09X1 Influenza due to identified novel influenza A virus with pneumonia: Secondary | ICD-10-CM | POA: Diagnosis not present

## 2015-09-22 DIAGNOSIS — J188 Other pneumonia, unspecified organism: Secondary | ICD-10-CM | POA: Diagnosis not present

## 2015-09-23 DIAGNOSIS — J09X1 Influenza due to identified novel influenza A virus with pneumonia: Secondary | ICD-10-CM | POA: Diagnosis not present

## 2015-09-23 DIAGNOSIS — E785 Hyperlipidemia, unspecified: Secondary | ICD-10-CM | POA: Diagnosis not present

## 2015-09-23 DIAGNOSIS — K2270 Barrett's esophagus without dysplasia: Secondary | ICD-10-CM | POA: Diagnosis not present

## 2015-09-23 DIAGNOSIS — J188 Other pneumonia, unspecified organism: Secondary | ICD-10-CM | POA: Diagnosis not present

## 2015-09-23 DIAGNOSIS — Z87891 Personal history of nicotine dependence: Secondary | ICD-10-CM | POA: Diagnosis not present

## 2015-09-23 DIAGNOSIS — K219 Gastro-esophageal reflux disease without esophagitis: Secondary | ICD-10-CM | POA: Diagnosis not present

## 2015-09-24 DIAGNOSIS — K219 Gastro-esophageal reflux disease without esophagitis: Secondary | ICD-10-CM | POA: Diagnosis not present

## 2015-09-24 DIAGNOSIS — R490 Dysphonia: Secondary | ICD-10-CM | POA: Diagnosis not present

## 2015-09-24 DIAGNOSIS — K2270 Barrett's esophagus without dysplasia: Secondary | ICD-10-CM | POA: Diagnosis not present

## 2015-09-24 DIAGNOSIS — J188 Other pneumonia, unspecified organism: Secondary | ICD-10-CM | POA: Diagnosis not present

## 2015-09-24 DIAGNOSIS — R634 Abnormal weight loss: Secondary | ICD-10-CM | POA: Insufficient documentation

## 2015-09-24 DIAGNOSIS — J040 Acute laryngitis: Secondary | ICD-10-CM | POA: Diagnosis not present

## 2015-09-24 DIAGNOSIS — E785 Hyperlipidemia, unspecified: Secondary | ICD-10-CM | POA: Diagnosis not present

## 2015-09-24 DIAGNOSIS — J09X1 Influenza due to identified novel influenza A virus with pneumonia: Secondary | ICD-10-CM | POA: Diagnosis not present

## 2015-09-24 DIAGNOSIS — H908 Mixed conductive and sensorineural hearing loss, unspecified: Secondary | ICD-10-CM | POA: Insufficient documentation

## 2015-09-24 DIAGNOSIS — H7293 Unspecified perforation of tympanic membrane, bilateral: Secondary | ICD-10-CM | POA: Insufficient documentation

## 2015-09-24 DIAGNOSIS — Z87891 Personal history of nicotine dependence: Secondary | ICD-10-CM | POA: Diagnosis not present

## 2015-09-25 DIAGNOSIS — Z87891 Personal history of nicotine dependence: Secondary | ICD-10-CM | POA: Diagnosis not present

## 2015-09-25 DIAGNOSIS — K2270 Barrett's esophagus without dysplasia: Secondary | ICD-10-CM | POA: Diagnosis not present

## 2015-09-25 DIAGNOSIS — J188 Other pneumonia, unspecified organism: Secondary | ICD-10-CM | POA: Diagnosis not present

## 2015-09-25 DIAGNOSIS — J09X1 Influenza due to identified novel influenza A virus with pneumonia: Secondary | ICD-10-CM | POA: Diagnosis not present

## 2015-09-25 DIAGNOSIS — E785 Hyperlipidemia, unspecified: Secondary | ICD-10-CM | POA: Diagnosis not present

## 2015-09-25 DIAGNOSIS — K219 Gastro-esophageal reflux disease without esophagitis: Secondary | ICD-10-CM | POA: Diagnosis not present

## 2015-09-26 DIAGNOSIS — K2270 Barrett's esophagus without dysplasia: Secondary | ICD-10-CM | POA: Diagnosis not present

## 2015-09-26 DIAGNOSIS — K219 Gastro-esophageal reflux disease without esophagitis: Secondary | ICD-10-CM | POA: Diagnosis not present

## 2015-09-26 DIAGNOSIS — Z87891 Personal history of nicotine dependence: Secondary | ICD-10-CM | POA: Diagnosis not present

## 2015-09-26 DIAGNOSIS — J188 Other pneumonia, unspecified organism: Secondary | ICD-10-CM | POA: Diagnosis not present

## 2015-09-26 DIAGNOSIS — J09X1 Influenza due to identified novel influenza A virus with pneumonia: Secondary | ICD-10-CM | POA: Diagnosis not present

## 2015-09-26 DIAGNOSIS — E785 Hyperlipidemia, unspecified: Secondary | ICD-10-CM | POA: Diagnosis not present

## 2015-09-29 ENCOUNTER — Telehealth: Payer: Self-pay | Admitting: Internal Medicine

## 2015-09-29 ENCOUNTER — Encounter: Payer: Medicare Other | Admitting: Adult Health

## 2015-09-29 DIAGNOSIS — Z87891 Personal history of nicotine dependence: Secondary | ICD-10-CM | POA: Diagnosis not present

## 2015-09-29 DIAGNOSIS — E785 Hyperlipidemia, unspecified: Secondary | ICD-10-CM | POA: Diagnosis not present

## 2015-09-29 DIAGNOSIS — J188 Other pneumonia, unspecified organism: Secondary | ICD-10-CM | POA: Diagnosis not present

## 2015-09-29 DIAGNOSIS — K2270 Barrett's esophagus without dysplasia: Secondary | ICD-10-CM | POA: Diagnosis not present

## 2015-09-29 DIAGNOSIS — K219 Gastro-esophageal reflux disease without esophagitis: Secondary | ICD-10-CM | POA: Diagnosis not present

## 2015-09-29 DIAGNOSIS — J09X1 Influenza due to identified novel influenza A virus with pneumonia: Secondary | ICD-10-CM | POA: Diagnosis not present

## 2015-09-29 NOTE — Telephone Encounter (Signed)
Spoke with [employer]. She is aware of the below. We need to call the pt and schedule a follow up appointment. This has been scheduled for 10/02/15. Nothing further was needed.

## 2015-09-29 NOTE — Telephone Encounter (Signed)
Spoke with [facility] Requesting that Gabapentin be discontinued. Pt was prescribed this for Upper Airway Cough Syndrome -taking TID Katlyn notes little cough with very little clear mucus at time of d/c. Pt has been discharged from home health.  Please advise Dr Melvyn Novas. Thanks.   Upper airway cough syndrome - Tanda Rockers, MD at 09/14/2015 8:25 AM     Status: Written Related Problem: Upper airway cough syndrome   Expand All Collapse All   Cyclical cough rx [id number] >>>  - CT sinus 08/29/2015 neg  - neuorontin added 09/02/15   I had an extended discussion with the patient and wife reviewing all relevant studies(including admit notes/ findings/ pulmonary inpt eval) completed to date and lasting 25 minutes of a 40 minute transition of care office visit   Again Explained the natural history of cyclical cough and why it's necessary in patients at risk to treat GERD aggressively - at least short term - to reduce risk of evolving cyclical cough initially triggered by epithelial injury and a heightened sensitivty to the effects of any upper airway irritants, most importantly acid - related - then perpetuated by epithelial injury related to the cough itself as the upper airway collapses on itself.  That is, the more sensitive the epithelium becomes once it is damaged by the virus, the more the ensuing irritability> the more the cough, the more the secondary reflux (especially in those prone to reflux) the more the irritation of the sensitive mucosa and so on in a Classic cyclical pattern. Part of this treatment is to completely stop all cough/ throat clearing which he has yet to accomplish.  Each maintenance medication was reviewed in detail including most importantly the difference between maintenance and prns and under what circumstances the prns are to be triggered using an action plan format that is not reflected in the computer generated alphabetically organized AVS.    Please see instructions for details which were reviewed in writing and the patient given a copy highlighting the part that I personally wrote and discussed at today's ov.

## 2015-09-29 NOTE — Telephone Encounter (Signed)
No change rx until next ov then we'll wean maintenance meds as tolerated  - the first priority is to wean off all the prns if not needed any more

## 2015-09-30 DIAGNOSIS — K2270 Barrett's esophagus without dysplasia: Secondary | ICD-10-CM | POA: Diagnosis not present

## 2015-09-30 DIAGNOSIS — E785 Hyperlipidemia, unspecified: Secondary | ICD-10-CM | POA: Diagnosis not present

## 2015-09-30 DIAGNOSIS — Z87891 Personal history of nicotine dependence: Secondary | ICD-10-CM | POA: Diagnosis not present

## 2015-09-30 DIAGNOSIS — J09X1 Influenza due to identified novel influenza A virus with pneumonia: Secondary | ICD-10-CM | POA: Diagnosis not present

## 2015-09-30 DIAGNOSIS — J188 Other pneumonia, unspecified organism: Secondary | ICD-10-CM | POA: Diagnosis not present

## 2015-09-30 DIAGNOSIS — K219 Gastro-esophageal reflux disease without esophagitis: Secondary | ICD-10-CM | POA: Diagnosis not present

## 2015-10-01 DIAGNOSIS — J188 Other pneumonia, unspecified organism: Secondary | ICD-10-CM | POA: Diagnosis not present

## 2015-10-01 DIAGNOSIS — Z87891 Personal history of nicotine dependence: Secondary | ICD-10-CM | POA: Diagnosis not present

## 2015-10-01 DIAGNOSIS — E785 Hyperlipidemia, unspecified: Secondary | ICD-10-CM | POA: Diagnosis not present

## 2015-10-01 DIAGNOSIS — J09X1 Influenza due to identified novel influenza A virus with pneumonia: Secondary | ICD-10-CM | POA: Diagnosis not present

## 2015-10-01 DIAGNOSIS — K2270 Barrett's esophagus without dysplasia: Secondary | ICD-10-CM | POA: Diagnosis not present

## 2015-10-01 DIAGNOSIS — K219 Gastro-esophageal reflux disease without esophagitis: Secondary | ICD-10-CM | POA: Diagnosis not present

## 2015-10-02 ENCOUNTER — Encounter: Payer: Self-pay | Admitting: Internal Medicine

## 2015-10-02 ENCOUNTER — Ambulatory Visit (INDEPENDENT_AMBULATORY_CARE_PROVIDER_SITE_OTHER): Payer: Medicare Other | Admitting: Internal Medicine

## 2015-10-02 ENCOUNTER — Ambulatory Visit (INDEPENDENT_AMBULATORY_CARE_PROVIDER_SITE_OTHER)
Admission: RE | Admit: 2015-10-02 | Discharge: 2015-10-02 | Disposition: A | Discharge status: Home / Self Care | Payer: Medicare Other | Source: Ambulatory Visit | Attending: Internal Medicine | Admitting: Internal Medicine

## 2015-10-02 VITALS — BP 120/70 | HR 124 | Ht 72.0 in | Wt 147.0 lb

## 2015-10-02 DIAGNOSIS — J9611 Chronic respiratory failure with hypoxia: Secondary | ICD-10-CM

## 2015-10-02 DIAGNOSIS — J189 Pneumonia, unspecified organism: Secondary | ICD-10-CM

## 2015-10-02 DIAGNOSIS — R05 Cough: Secondary | ICD-10-CM | POA: Diagnosis not present

## 2015-10-02 DIAGNOSIS — R058 Other specified cough: Secondary | ICD-10-CM

## 2015-10-02 MED ORDER — ACETAMINOPHEN-CODEINE #3 300-30 MG PO TABS: ORAL_TABLET | ORAL | Status: DC

## 2015-10-02 NOTE — Patient Instructions (Addendum)
Take delsym two tsp every 12 hours with flutter and supplement if needed with  Tylenol #3 up to 2 every 4 hours to suppress the urge to cough. Swallowing water or using ice chips/non mint and menthol containing candies (such as lifesavers or sugarless jolly ranchers) are also effective.  You should rest your voice and avoid activities that you know make you cough.  Once you have eliminated the cough for 3 straight days try reducing the Tylenol #3 first,  then the delsym as tolerated.  Continue prilosec Take 30- 60 min before your first and last meals of the day and pepcid 20 mg  ac at bedtime  Add zyrtec at bedtime    See Tammy NP w/in 2 weeks(or first available after that)  with all your medications, even over the counter meds, separated in two separate bags, the ones you take no matter what vs the ones you stop once you feel better and take only as needed when you feel you need them.   Tammy  will generate for you a new user friendly medication calendar that will put [place] all on the same page re: your medication use.     Without this process, it simply isn't possible to assure that we are providing  your outpatient care  with  the attention to detail we feel you deserve.   If we cannot assure that you're getting that kind of care,  then we cannot manage your problem effectively from this clinic.  Once you have seen Tammy and we are sure that we're all on the same page with your medication use she will arrange follow up with me.   Please remember to go to the   x-ray department downstairs for your tests - we will call you with the results when they are available.

## 2015-10-02 NOTE — Progress Notes (Signed)
Subjective:    Patient ID: [name], male    DOB: 1950/09/09,     MRN: [id number]    Brief patient profile:  37 yowm quit smoking in 1989 with pattern of pattern of bad cough esp with colds no change before quit or after eval by allergist in [name] referred to pulmonary clinic 08/21/2015 by [name] with "the worst ever cough " = lasted longer / more severe.  Prev w/u by [name] in 2009 with nl pfts.     History of Present Illness  08/21/2015 1st [facility] office visit/ Breasia Karges   Chief Complaint  Patient presents with  . Pulmonary Consult    Referred by [name]. Pt c/o cough x 1 month- prod with clear, sticky sputum. He has noticed cough is trigerred by talking and exertion. He states he "goes into spasms" 25-30 x per day. He states he has coughed until almost vomits.   usual spells x lifetime  Last  only a few weeks /this did not feel like a typical cold  At onset one month prior to OV   maint on prilosec 20 mg bid pre- onset  Already 4 abx / pred/ no better  Better at hs/ worse with talking  Sometimes ant cp diffuse fleeting p severe cough  rec The key to effective treatment for your cough is eliminating the non-stop cycle of cough you're stuck in long enough to let your airway heal completely and then see if there is anything still making you cough once you stop the cough suppression, but this should take no more than 5 days to figure out First take delsym two tsp every 12 hours and supplement if needed with  Tylenol #3  up to 1-2 every 4 hours to suppress the urge to cough at all or even clear your throat.   Once you have eliminated the cough for 3 straight days try reducing the Tylenol #3 first,  then the delsym as tolerated.   Prednisone 10 mg take  4 each am x 2 days,   2 each am x 2 days,  1 each am x 2 days and stop (this is to eliminate allergies and inflammation from coughing) Whenever coughing > prilosec 20 x 2 x 30 min x before bfast and supper - once  you are better x one week then change back to previous dose    GERD diet  Always cough into the flutter valve to prevent airway trauma    08/26/2015 acute extended ov/[facility] re: persistent severe cough/ finishing bactrim rx today  Chief Complaint  Patient presents with  . Acute Visit    Pt states woke up with bruise on his left side on 08/25/14- does not recall any injury. He feels pain underneath the left arm. He states that he is having longer periods of time without cough, but cough is more severe when he does cough. He also c/o insomnia since last here.   never took more than one tylenol #3 - cough remains non productive/ denies excess/ purulent sputum or mucus plugs   Extensive bruising L chest wall with pain in L axilla with coughing  rec First take delsym two tsp every 12 hours and supplement if needed with  Tylenol #3  up to 1-2 every 4 hours to suppress the urge to cough at all or even clear your throat. Swallowing water or using ice chips/non mint and menthol containing candies (such as lifesavers or sugarless jolly ranchers) are  also effective.  You should rest your voice and avoid activities that you know make you cough. Once you have eliminated the cough for 3 straight days try reducing the Tylenol #3 first,  then the delsym as tolerated.   Whenever coughing > prilosec 20 x 2 x 30 min x before bfast and supper - once you are better x one week then change back to previous dose    GERD  Always cough into the flutter valve to prevent airway trauma  Xanax 0.5 mg at bedtime as needed  Sinus CT  > neg     09/02/2015  f/u ov/[facility] re: cough since jan 1/ not resp to tylenol #3 x 2 q4, not taking ppi as rec  Chief Complaint  Patient presents with  . Follow-up    Pt c/o continued cough with clear mucus that is more present when pt is in any kind of reclining position. Pt denies wheeze/CP/tightness. Pt reports that once he starts coughing he begins to have "spasms" that last several minutes  and often require sips of water and hard candy to resolve. Pt is very concerned and per wife is getting depressed. Pt also c/o frequents "sweats" and low grade fevers.   sense of something stuck at suprasternal notch and if just coughs hard enough it will come up / cp resolved  New night sweats since 2/26 when temp of 100 per wife but no fever since. rec Please remember to go to the   x-ray department downstairs for your tests - we will call you with the results when they are available. Avoid speaking/ sleep in recliner to prevent coughing fits and use the flutter on max resistance as much as possible Prilosec 20 mg x 2 Take 30- 60 min before your first and last meals of the day and Pepcid ac 20 mg at bedtime For drainage / throat tickle try take CHLORPHENIRAMINE  4 mg - take one every 4 hours as needed - available over the counter- may cause drowsiness so start with just a bedtime dose or two and see how you tolerate it before trying in daytime   Depomedrol 120 mg IM today  If not better w/in a few days > gabapentin 100 mg three times a day with meals  Add cxr c/w pna > rx levaquin    Admit date: 09/04/2015 Discharge date: 09/09/2015  Principal Problem:  CAP (community acquired pneumonia) Active Problems:  HLD (hyperlipidemia)  Depression  ADD (attention deficit disorder)  G E R D  Hyperparathyroidism, primary, s/p MI parathyroidectomy 7/18, 3.1 gm adenoma  Upper airway cough syndrome  Acute respiratory distress (HCC)  Barrett's esophagus  Dyspnea on exertion  Influenza  Cough    09/12/2015  Post hosp f/u ov/[facility] re:  Influenza pna/ cough since Jan 2017 /02 dep since d/c on neurontin 100 tid  Chief Complaint  Patient presents with  . Follow-up    Pt c/o continued SOB and labored breathing, still some cough - which improved while in hospital - but is now coming back some, and some possible thrush mouth/tongue. Pt denies CP/tightness. Pt is on O2 more frequently and would  like to discuss getting a POC.   Still feels sensation of too much throat mucus but no excess production/ just dry hacking - all fever/ sweats resolved  Was d/c on pulmicort neb but too expendsive so did not purchase  rec Clotrimazole troche 10 mg four x daily x 3 days and  Stop (should heal your tongue )  Stop tussionex to see if bladder function better  02 is 2lpm bedtime only  Take delsym two tsp every 12 hours with flutter and supplement if needed with  Tylenol #3  up to 2 every 4 hours to suppress the urge to cough. Swallowing water or using ice chips/non mint and menthol containing candies (such as lifesavers or sugarless jolly ranchers) are also effective.  You should rest your voice and avoid activities that you know make you cough. Once you have eliminated the cough for 3 straight days try reducing the Tylenol #3 first,  then the delsym as tolerated. See Tammy NP w/in 2 weeks(or first available after that)  with all your medications > did not do as "all better"   10/02/2015  Acute extended  ov/[place] re: recurrent cough  Chief Complaint  Patient presents with  . Acute Visit    Cough had completely resolved, then developed "new cough" 6 days ago. This cough is prod with large amounts of clear sputum.   eval by Constance Holster  09/24/15 because still tongue was irritated reporting per notes sore throat and hoarseness > erythema of VC's only  And nor recs - after this pt reports no clearing throat or coughing  while on delsym  Then stopped neurontin due to side effects and cough worse since, though convinced the cough flared before he stopped it, this part of hx is unclear but now cc  recurrent cough more upright than lying down  Using nice lozenges/ sporadic daytime cough , min productive mucoid sputum/ no flutter valve in hand   No obvious day to day or daytime variability or assoc sob or purulent sputum or mucus plugs  chest tightness, subjective wheeze or overt sinus or hb symptoms. No unusual exp hx  or h/o childhood pna/ asthma or knowledge of premature birth.  Sleeping ok without nocturnal  or early am exacerbation  of respiratory  c/o's or need for noct saba. Also denies any obvious fluctuation of symptoms with weather or environmental changes or other aggravating or alleviating factors except as outlined above   Current Medications, Allergies, Complete Past Medical History, Past Surgical History, Family History, and Social History were reviewed in [employer] record.  ROS  The following are not active complaints unless bolded sore throat, dysphagia, dental problems, itching, sneezing,  nasal congestion or excess/ purulent secretions, ear ache,   fever, chills, sweats, unintended wt loss, classically pleuritic or exertional cp, hemoptysis,  orthopnea pnd or leg swelling, presyncope, palpitations, abdominal pain, anorexia, nausea, vomiting, diarrhea  or change in bowel or bladder habits, change in stools or urine, dysuria,hematuria,  rash, arthralgias, visual complaints, headache, numbness, weakness or ataxia or problems with walking or coordination,  change in mood/affect or memory.            Objective:   Physical Exam  Elderly wm >> stated age/ now fully ambulatory   09/02/2015        153  > 09/12/2015  150 >  10/02/2015  147   08/26/2015       155   08/21/15 156 lb 3.2 oz (70.852 kg)  05/26/15 162 lb 5 oz (73.624 kg)  05/13/15 163 lb 3.2 oz (74.027 kg)    Vital signs reviewed   HEENT: nl dentition, turbinates, - oropharynx pristine.  Nl external ear canals without cough reflex   NECK :  without JVD/Nodes/TM/ nl carotid upstrokes bilaterally   LUNGS: no acc muscle use,  Nl contour chest which is clear  to A and P bilaterally without cough on insp or exp maneuvers   CV:  RRR  no s3 or murmur or increase in P2, no edema   ABD:  soft and nontender with nl inspiratory excursion in the supine position. No bruits or organomegaly, bowel sounds nl  MS:  Nl  gait/ ext warm without deformities, calf tenderness, cyanosis or clubbing No obvious joint restrictions   SKIN: warm and dry with no lesions   NEURO:  alert, approp, nl sensorium with  no motor deficits    CXR PA and Lateral:   10/02/2015 :    I personally reviewed images and agree with radiology impression as follows:    marked improvement in bilateral upper lobe infiltrates with min residual changes in LUL                 Assessment & Plan:

## 2015-10-03 DIAGNOSIS — Z87891 Personal history of nicotine dependence: Secondary | ICD-10-CM | POA: Diagnosis not present

## 2015-10-03 DIAGNOSIS — E785 Hyperlipidemia, unspecified: Secondary | ICD-10-CM | POA: Diagnosis not present

## 2015-10-03 DIAGNOSIS — J09X1 Influenza due to identified novel influenza A virus with pneumonia: Secondary | ICD-10-CM | POA: Diagnosis not present

## 2015-10-03 DIAGNOSIS — J188 Other pneumonia, unspecified organism: Secondary | ICD-10-CM | POA: Diagnosis not present

## 2015-10-03 DIAGNOSIS — K219 Gastro-esophageal reflux disease without esophagitis: Secondary | ICD-10-CM | POA: Diagnosis not present

## 2015-10-03 DIAGNOSIS — K2270 Barrett's esophagus without dysplasia: Secondary | ICD-10-CM | POA: Diagnosis not present

## 2015-10-03 NOTE — Assessment & Plan Note (Signed)
09/12/2015  Walked RA x 3 laps @ 185 ft each stopped due to  End of study, slow  pace, no sob or desat     rec as of 09/12/2015 02 2lpm hs only > pt d/c on his own 10/02/2015

## 2015-10-03 NOTE — Assessment & Plan Note (Signed)
See cxr 09/02/2015 > rx levaquin 500 mg x 7 days > admit with influenza > cxr 10/02/2015 marked improvement, min residual changes L > RUL  No further f/u needed at this point

## 2015-10-03 NOTE — Assessment & Plan Note (Addendum)
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  Will try cyclcial cough regimen again short term and low threshold to refer to voice center [facility]  I had an extended discussion with the patient reviewing all relevant studies completed to date and  lasting 25  minutes of a 40 minute extended acute visit    Desperately needs in meantime to establish / maintain adequate medication reconciliation and not ad lib on his maint rx without calling to at least document when he changes a maint rx so we can put together cause and effect for this refractory chronic problem.  To keep things simple, I have asked the patient to first separate medicines that are perceived as maintenance, that is to be taken daily "no matter what", from those medicines that are taken on only on an as-needed basis and I have given the patient examples of both, and then return to see our NP to generate a  detailed  medication calendar which should be followed until the next physician sees the patient and updates it.    Each maintenance medication was reviewed in detail including most importantly the difference between maintenance and prns and under what circumstances the prns are to be triggered using an action plan format that is not reflected in the computer generated alphabetically organized AVS.    Please see instructions for details which were reviewed in writing and the patient given a copy highlighting the part that I personally wrote and discussed at today's ov.

## 2015-10-06 DIAGNOSIS — E785 Hyperlipidemia, unspecified: Secondary | ICD-10-CM | POA: Diagnosis not present

## 2015-10-06 DIAGNOSIS — J188 Other pneumonia, unspecified organism: Secondary | ICD-10-CM | POA: Diagnosis not present

## 2015-10-06 DIAGNOSIS — K219 Gastro-esophageal reflux disease without esophagitis: Secondary | ICD-10-CM | POA: Diagnosis not present

## 2015-10-06 DIAGNOSIS — K2270 Barrett's esophagus without dysplasia: Secondary | ICD-10-CM | POA: Diagnosis not present

## 2015-10-06 DIAGNOSIS — J09X1 Influenza due to identified novel influenza A virus with pneumonia: Secondary | ICD-10-CM | POA: Diagnosis not present

## 2015-10-06 DIAGNOSIS — Z87891 Personal history of nicotine dependence: Secondary | ICD-10-CM | POA: Diagnosis not present

## 2015-10-07 ENCOUNTER — Telehealth: Payer: Self-pay | Admitting: Internal Medicine

## 2015-10-07 DIAGNOSIS — J188 Other pneumonia, unspecified organism: Secondary | ICD-10-CM | POA: Diagnosis not present

## 2015-10-07 DIAGNOSIS — J09X1 Influenza due to identified novel influenza A virus with pneumonia: Secondary | ICD-10-CM | POA: Diagnosis not present

## 2015-10-07 DIAGNOSIS — K2270 Barrett's esophagus without dysplasia: Secondary | ICD-10-CM | POA: Diagnosis not present

## 2015-10-07 DIAGNOSIS — Z87891 Personal history of nicotine dependence: Secondary | ICD-10-CM | POA: Diagnosis not present

## 2015-10-07 DIAGNOSIS — K219 Gastro-esophageal reflux disease without esophagitis: Secondary | ICD-10-CM | POA: Diagnosis not present

## 2015-10-07 DIAGNOSIS — E785 Hyperlipidemia, unspecified: Secondary | ICD-10-CM | POA: Diagnosis not present

## 2015-10-07 MED ORDER — ACETAMINOPHEN-CODEINE #3 300-30 MG PO TABS: ORAL_TABLET | ORAL | Status: DC

## 2015-10-07 NOTE — Telephone Encounter (Signed)
Spoke with pt's wife, states pt's cough is still present- cough is sometimes productive with clear mucus.  Cough worse qam and sporadic throughout the day. States it has improved since his visit last week but still present.  Denies sinus congestion, chest pain, fever.   Pt is taking tylenol #3 q4h when he is awake- is now out.  Pt also taking delsym cough syrup prn.  Pt requesting further recs.   [facility] on [address].    MW please advise on further recs.  Thanks.    Patient Instructions       Take delsym two tsp every 12 hours with flutter and supplement if needed with  Tylenol #3 up to 2 every 4 hours to suppress the urge to cough. Swallowing water or using ice chips/non mint and menthol containing candies (such as lifesavers or sugarless jolly ranchers) are also effective.  You should rest your voice and avoid activities that you know make you cough.  Once you have eliminated the cough for 3 straight days try reducing the Tylenol #3 first,  then the delsym as tolerated.  Continue prilosec Take 30- 60 min before your first and last meals of the day and pepcid 20 mg  ac at bedtime  Add zyrtec at bedtime     See Tammy NP w/in 2 weeks(or first available after that)  with all your medications, even over the counter meds, separated in two separate bags, the ones you take no matter what vs the ones you stop once you feel better and take only as needed when you feel you need them.   Tammy  will generate for you a new user friendly medication calendar that will put [place] all on the same page re: your medication use.     Without this process, it simply isn't possible to assure that we are providing  your outpatient care  with  the attention to detail we feel you deserve.   If we cannot assure that you're getting that kind of care,  then we cannot manage your problem effectively from this clinic.  Once you have seen Tammy and we are sure that we're all on the same page with your medication use she will  arrange follow up with me.   Please remember to go to the   x-ray department downstairs for your tests - we will call you with the results when they are available.

## 2015-10-07 NOTE — Telephone Encounter (Signed)
The tylenol #3 was supposed to be 2 every 4 h if still coughing with goal of elimination of the cough for 3 straight days - ok to give another 40 but nothing else to offer until returns for full med reconciliation

## 2015-10-07 NOTE — Telephone Encounter (Signed)
Spoke with pt, aware of recs.  rx sent to pharmacy.  Nothing further needed.  

## 2015-10-08 DIAGNOSIS — Z85820 Personal history of malignant melanoma of skin: Secondary | ICD-10-CM | POA: Diagnosis not present

## 2015-10-08 DIAGNOSIS — D1801 Hemangioma of skin and subcutaneous tissue: Secondary | ICD-10-CM | POA: Diagnosis not present

## 2015-10-08 DIAGNOSIS — L821 Other seborrheic keratosis: Secondary | ICD-10-CM | POA: Diagnosis not present

## 2015-10-08 DIAGNOSIS — L812 Freckles: Secondary | ICD-10-CM | POA: Diagnosis not present

## 2015-10-09 DIAGNOSIS — R432 Parageusia: Secondary | ICD-10-CM | POA: Diagnosis not present

## 2015-10-09 DIAGNOSIS — K2270 Barrett's esophagus without dysplasia: Secondary | ICD-10-CM | POA: Diagnosis not present

## 2015-10-09 DIAGNOSIS — Z87891 Personal history of nicotine dependence: Secondary | ICD-10-CM | POA: Diagnosis not present

## 2015-10-09 DIAGNOSIS — J188 Other pneumonia, unspecified organism: Secondary | ICD-10-CM | POA: Diagnosis not present

## 2015-10-09 DIAGNOSIS — J09X1 Influenza due to identified novel influenza A virus with pneumonia: Secondary | ICD-10-CM | POA: Diagnosis not present

## 2015-10-09 DIAGNOSIS — K219 Gastro-esophageal reflux disease without esophagitis: Secondary | ICD-10-CM | POA: Diagnosis not present

## 2015-10-09 DIAGNOSIS — E785 Hyperlipidemia, unspecified: Secondary | ICD-10-CM | POA: Diagnosis not present

## 2015-10-10 ENCOUNTER — Telehealth: Payer: Self-pay | Admitting: Internal Medicine

## 2015-10-10 DIAGNOSIS — J188 Other pneumonia, unspecified organism: Secondary | ICD-10-CM | POA: Diagnosis not present

## 2015-10-10 DIAGNOSIS — E785 Hyperlipidemia, unspecified: Secondary | ICD-10-CM | POA: Diagnosis not present

## 2015-10-10 DIAGNOSIS — J09X1 Influenza due to identified novel influenza A virus with pneumonia: Secondary | ICD-10-CM | POA: Diagnosis not present

## 2015-10-10 DIAGNOSIS — R059 Cough, unspecified: Secondary | ICD-10-CM

## 2015-10-10 DIAGNOSIS — K2270 Barrett's esophagus without dysplasia: Secondary | ICD-10-CM | POA: Diagnosis not present

## 2015-10-10 DIAGNOSIS — K219 Gastro-esophageal reflux disease without esophagitis: Secondary | ICD-10-CM | POA: Diagnosis not present

## 2015-10-10 DIAGNOSIS — Z87891 Personal history of nicotine dependence: Secondary | ICD-10-CM | POA: Diagnosis not present

## 2015-10-10 DIAGNOSIS — R05 Cough: Secondary | ICD-10-CM

## 2015-10-10 NOTE — Telephone Encounter (Signed)
Patient notified of Dr. Gustavus Bryant recommendations. Referral entered for [facility]. Nothing further needed.

## 2015-10-10 NOTE — Telephone Encounter (Signed)
Patient states that in spite of taking the Delsym and Codeine #3 and candy, the cough has gotten much worse.  Patient states that he is coughing out clear mucus.  No chest tightness, no fever.  Patient "just doesn't feel well".  Patient's grandson has the flu and he was exposed to his grandson.  Patient had flu shot this year, but wonders if he may have caught the flu from his grandson.   Pharmacy:  [facility] - [address]  Allergies  Allergen Reactions  . Ciprofloxacin Hcl Other (See Comments)    Burning in ear when drops were used  . Asa [Aspirin]   . Gabapentin     Weakness and dizziness    Patient Instructions     Take delsym two tsp every 12 hours with flutter and supplement if needed with Tylenol #3 up to 2 every 4 hours to suppress the urge to cough. Swallowing water or using ice chips/non mint and menthol containing candies (such as lifesavers or sugarless jolly ranchers) are also effective. You should rest your voice and avoid activities that you know make you cough.  Once you have eliminated the cough for 3 straight days try reducing the Tylenol #3 first, then the delsym as tolerated.  Continue prilosec Take 30- 60 min before your first and last meals of the day and pepcid 20 mg ac at bedtime  Add zyrtec at bedtime   See Tammy NP w/in 2 weeks(or first available after that) with all your medications, even over the counter meds, separated in two separate bags, the ones you take no matter what vs the ones you stop once you feel better and take only as needed when you feel you need them. Tammy will generate for you a new user friendly medication calendar that will put [place] all on the same page re: your medication use.    Without this process, it simply isn't possible to assure that we are providing your outpatient care with the attention to detail we feel you deserve. If we cannot assure that you're getting that kind of care, then we cannot manage your problem  effectively from this clinic.  Once you have seen Tammy and we are sure that we're all on the same page with your medication use she will arrange follow up with me.   Please remember to go to the x-ray department downstairs for your tests - we will call you with the results when they are available.

## 2015-10-10 NOTE — Telephone Encounter (Signed)
Just had the flu so don't think this is likely    I'm very sorry but nothing else to offer here > suggest consider referral to [facility] pulmonary department and to ER in meantime if condition worsens (could even go to ER at [facility] to expedite eval if condition warrants) - either way be sure to take all active meds in hand when go.

## 2015-10-13 DIAGNOSIS — J09X1 Influenza due to identified novel influenza A virus with pneumonia: Secondary | ICD-10-CM | POA: Diagnosis not present

## 2015-10-13 DIAGNOSIS — Z87891 Personal history of nicotine dependence: Secondary | ICD-10-CM | POA: Diagnosis not present

## 2015-10-13 DIAGNOSIS — K2270 Barrett's esophagus without dysplasia: Secondary | ICD-10-CM | POA: Diagnosis not present

## 2015-10-13 DIAGNOSIS — J188 Other pneumonia, unspecified organism: Secondary | ICD-10-CM | POA: Diagnosis not present

## 2015-10-13 DIAGNOSIS — E785 Hyperlipidemia, unspecified: Secondary | ICD-10-CM | POA: Diagnosis not present

## 2015-10-13 DIAGNOSIS — K219 Gastro-esophageal reflux disease without esophagitis: Secondary | ICD-10-CM | POA: Diagnosis not present

## 2015-10-14 DIAGNOSIS — E785 Hyperlipidemia, unspecified: Secondary | ICD-10-CM | POA: Diagnosis not present

## 2015-10-14 DIAGNOSIS — J188 Other pneumonia, unspecified organism: Secondary | ICD-10-CM | POA: Diagnosis not present

## 2015-10-14 DIAGNOSIS — Z87891 Personal history of nicotine dependence: Secondary | ICD-10-CM | POA: Diagnosis not present

## 2015-10-14 DIAGNOSIS — K2270 Barrett's esophagus without dysplasia: Secondary | ICD-10-CM | POA: Diagnosis not present

## 2015-10-14 DIAGNOSIS — J09X1 Influenza due to identified novel influenza A virus with pneumonia: Secondary | ICD-10-CM | POA: Diagnosis not present

## 2015-10-14 DIAGNOSIS — K219 Gastro-esophageal reflux disease without esophagitis: Secondary | ICD-10-CM | POA: Diagnosis not present

## 2015-10-15 DIAGNOSIS — J188 Other pneumonia, unspecified organism: Secondary | ICD-10-CM | POA: Diagnosis not present

## 2015-10-15 DIAGNOSIS — J09X1 Influenza due to identified novel influenza A virus with pneumonia: Secondary | ICD-10-CM | POA: Diagnosis not present

## 2015-10-15 DIAGNOSIS — E785 Hyperlipidemia, unspecified: Secondary | ICD-10-CM | POA: Diagnosis not present

## 2015-10-15 DIAGNOSIS — K219 Gastro-esophageal reflux disease without esophagitis: Secondary | ICD-10-CM | POA: Diagnosis not present

## 2015-10-15 DIAGNOSIS — K2270 Barrett's esophagus without dysplasia: Secondary | ICD-10-CM | POA: Diagnosis not present

## 2015-10-15 DIAGNOSIS — Z87891 Personal history of nicotine dependence: Secondary | ICD-10-CM | POA: Diagnosis not present

## 2015-10-20 ENCOUNTER — Encounter (HOSPITAL_COMMUNITY): Payer: Self-pay | Admitting: *Deleted

## 2015-10-20 ENCOUNTER — Observation Stay (HOSPITAL_COMMUNITY)
Admission: EM | Admit: 2015-10-20 | Discharge: 2015-10-22 | Disposition: A | Discharge status: Home / Self Care | Payer: Medicare Other | Attending: Internal Medicine | Admitting: Internal Medicine

## 2015-10-20 DIAGNOSIS — Z79899 Other long term (current) drug therapy: Secondary | ICD-10-CM | POA: Diagnosis not present

## 2015-10-20 DIAGNOSIS — Z87891 Personal history of nicotine dependence: Secondary | ICD-10-CM | POA: Insufficient documentation

## 2015-10-20 DIAGNOSIS — G255 Other chorea: Secondary | ICD-10-CM | POA: Diagnosis not present

## 2015-10-20 DIAGNOSIS — Z7951 Long term (current) use of inhaled steroids: Secondary | ICD-10-CM | POA: Insufficient documentation

## 2015-10-20 DIAGNOSIS — Z85858 Personal history of malignant neoplasm of other endocrine glands: Secondary | ICD-10-CM | POA: Diagnosis not present

## 2015-10-20 DIAGNOSIS — Z96642 Presence of left artificial hip joint: Secondary | ICD-10-CM | POA: Diagnosis not present

## 2015-10-20 DIAGNOSIS — R258 Other abnormal involuntary movements: Secondary | ICD-10-CM | POA: Diagnosis not present

## 2015-10-20 DIAGNOSIS — N400 Enlarged prostate without lower urinary tract symptoms: Secondary | ICD-10-CM | POA: Insufficient documentation

## 2015-10-20 DIAGNOSIS — E440 Moderate protein-calorie malnutrition: Secondary | ICD-10-CM | POA: Diagnosis not present

## 2015-10-20 DIAGNOSIS — E785 Hyperlipidemia, unspecified: Secondary | ICD-10-CM | POA: Insufficient documentation

## 2015-10-20 DIAGNOSIS — G459 Transient cerebral ischemic attack, unspecified: Secondary | ICD-10-CM

## 2015-10-20 DIAGNOSIS — J188 Other pneumonia, unspecified organism: Secondary | ICD-10-CM | POA: Diagnosis not present

## 2015-10-20 DIAGNOSIS — R29898 Other symptoms and signs involving the musculoskeletal system: Secondary | ICD-10-CM

## 2015-10-20 DIAGNOSIS — G451 Carotid artery syndrome (hemispheric): Secondary | ICD-10-CM

## 2015-10-20 DIAGNOSIS — F909 Attention-deficit hyperactivity disorder, unspecified type: Secondary | ICD-10-CM | POA: Insufficient documentation

## 2015-10-20 DIAGNOSIS — J09X1 Influenza due to identified novel influenza A virus with pneumonia: Secondary | ICD-10-CM | POA: Diagnosis not present

## 2015-10-20 DIAGNOSIS — Z6821 Body mass index (BMI) 21.0-21.9, adult: Secondary | ICD-10-CM | POA: Insufficient documentation

## 2015-10-20 DIAGNOSIS — K219 Gastro-esophageal reflux disease without esophagitis: Secondary | ICD-10-CM | POA: Insufficient documentation

## 2015-10-20 DIAGNOSIS — K2270 Barrett's esophagus without dysplasia: Secondary | ICD-10-CM | POA: Diagnosis not present

## 2015-10-20 DIAGNOSIS — R531 Weakness: Secondary | ICD-10-CM | POA: Diagnosis present

## 2015-10-20 LAB — DIFFERENTIAL
BASOS ABS: 0 10*3/uL (ref 0.0–0.1)
BASOS PCT: 0 %
EOS ABS: 0.1 10*3/uL (ref 0.0–0.7)
Eosinophils Relative: 1 %
Lymphocytes Relative: 27 %
Lymphs Abs: 3 10*3/uL (ref 0.7–4.0)
MONOS PCT: 9 %
Monocytes Absolute: 1 10*3/uL (ref 0.1–1.0)
NEUTROS ABS: 7.1 10*3/uL (ref 1.7–7.7)
NEUTROS PCT: 63 %

## 2015-10-20 LAB — COMPREHENSIVE METABOLIC PANEL
ALBUMIN: 3.7 g/dL (ref 3.5–5.0)
ALT: 29 U/L (ref 17–63)
AST: 25 U/L (ref 15–41)
Alkaline Phosphatase: 98 U/L (ref 38–126)
Anion gap: 10 (ref 5–15)
BUN: 14 mg/dL (ref 6–20)
CHLORIDE: 101 mmol/L (ref 101–111)
CO2: 27 mmol/L (ref 22–32)
CREATININE: 0.96 mg/dL (ref 0.61–1.24)
Calcium: 9.2 mg/dL (ref 8.9–10.3)
GFR calc Af Amer: >60 mL/min (ref >60)
GFR calc non Af Amer: >60 mL/min (ref >60)
GLUCOSE: 104 mg/dL — AB (ref 65–99)
POTASSIUM: 4.1 mmol/L (ref 3.5–5.1)
SODIUM: 138 mmol/L (ref 135–145)
Total Bilirubin: 0.6 mg/dL (ref 0.3–1.2)
Total Protein: 7.1 g/dL (ref 6.5–8.1)

## 2015-10-20 LAB — I-STAT TROPONIN, ED: TROPONIN I, POC: 0 ng/mL (ref 0.00–0.08)

## 2015-10-20 LAB — CBC
HEMATOCRIT: 44.3 % (ref 39.0–52.0)
Hemoglobin: 14.5 g/dL (ref 13.0–17.0)
MCH: 29.7 pg (ref 26.0–34.0)
MCHC: 32.7 g/dL (ref 30.0–36.0)
MCV: 90.8 fL (ref 78.0–100.0)
Platelets: 283 10*3/uL (ref 150–400)
RBC: 4.88 MIL/uL (ref 4.22–5.81)
RDW: 14 % (ref 11.5–15.5)
WBC: 11.2 10*3/uL — AB (ref 4.0–10.5)

## 2015-10-20 LAB — APTT: APTT: 33 s (ref 24–37)

## 2015-10-20 LAB — PROTIME-INR
INR: 1.06 (ref 0.00–1.49)
Prothrombin Time: 14 seconds (ref 11.6–15.2)

## 2015-10-20 LAB — CK: Total CK: 50 U/L (ref 49–397)

## 2015-10-20 NOTE — ED Notes (Signed)
Pt reports that he feels like his "brain is not connected to his legs...  My tongue feels heavy or thick and I keep biting it."  "I'm not sure if this is more of what has been going on but its getting worse."  States he made two mistakes making coffee this am.

## 2015-10-20 NOTE — H&P (Signed)
[facility] Admission History and Physical       KAESEN RODRIGUEZ [id number] DOB: July 02, 1951 DOA: 10/20/2015  Referring physician: EDP PCP: Lilian Coma, MD  Specialists:   Chief Complaint:  Involuntary Movement of his Legs and Arms  HPI: IVANN Gomez is a 65 y.o. male with recent admission for Pneumonia on home PT for deconditioning who began to have an episode of uncontrolled activity of his Left leg then his Left arm, and into his right leg.    He was unable to walk.  He was seen in the ED and Neurology was consulted a Ct scan and MRI was ordered  Review of Systems:  Constitutional: No Weight Loss, No Weight Gain, Night Sweats, Fevers, Chills, Dizziness, Light Headedness, Fatigue, or Generalized Weakness HEENT: No Headaches, Difficulty Swallowing,Tooth/Dental Problems,Sore Throat,  No Sneezing, Rhinitis, Ear Ache, Nasal Congestion, or Post Nasal Drip,  Cardio-vascular:  No Chest pain, Orthopnea, PND, Edema in Lower Extremities, Anasarca, Dizziness, Palpitations  Resp: No Dyspnea, No DOE, No Productive Cough, No Non-Productive Cough, No Hemoptysis, No Wheezing.    GI: No Heartburn, Indigestion, Abdominal Pain, Nausea, Vomiting, Diarrhea, Constipation, Hematemesis, Hematochezia, Melena, Change in Bowel Habits,  Loss of Appetite  GU: No Dysuria, No Change in Color of Urine, No Urgency or Urinary Frequency, No Flank pain.  Musculoskeletal: No Joint Pain or Swelling, No Decreased Range of Motion, No Back Pain.  Neurologic: No Syncope, No Seizures, +Muscle Spasms /Tremors of Random Extremities, Paresthesia, Vision Disturbance or Loss, No Diplopia, No Vertigo, No Difficulty Walking,  Skin: No Rash or Lesions. Psych: No Change in Mood or Affect, No Depression or Anxiety, No Memory loss, No Confusion, or Hallucinations   Past Medical History  Diagnosis Date  . Pneumonia 2009  . 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 Mitral valve prolapse     does not see  cardiologist for. last stress test 2003  . Hypercalcemia   . Arthritis   . GERD (gastroesophageal reflux disease)   . Lipoma     left forearm (3)  . Dysrhythmia     ":Due to MVP"  . Bronchitis     history of  . Tumor cells, benign     lung, left side  . Hiatal hernia      Past Surgical History  Procedure Laterality Date  . Cholecystectomy  2002  . Mastoidectomy  1996  . Appendectomy  1984  . Cataract extraction      L and R eye  . Parathyroidectomy    . Nasal septum surgery      sinus surgery  . Total hip revision  06/14/2011    Procedure: TOTAL HIP REVISION;  Surgeon: Kerin Salen;  Location: [facility];  Service: Orthopedics;  Laterality: Left;  Left Acetabular  Hip Revision  . Joint replacement      Lt hip      Prior to Admission medications   Medication Sig Start Date End Date Taking? Authorizing Provider  ALPRAZolam Duanne Moron) 0.5 MG tablet Take 1 tablet (0.5 mg total) by mouth at bedtime as needed for anxiety. 08/26/15  Yes Tanda Rockers, MD  buPROPion [facility] SR) 200 MG 12 hr tablet Take 200 mg by mouth 2 (two) times daily.   Yes Historical Provider, MD  dextromethorphan (DELSYM) 30 MG/5ML liquid Take 10 mLs by mouth every 12 (twelve) hours as needed for cough.   Yes Historical Provider, MD  famotidine (PEPCID) 20 MG tablet Take 1 tablet (20 mg total) by  mouth at bedtime. 09/09/15  Yes Reyne Dumas, MD  [place] 2 each by mouth daily.   Yes Historical Provider, MD  FLUoxetine (PROZAC) 40 MG capsule Take 40 mg by mouth daily.     Yes Historical Provider, MD  fluticasone (FLONASE) 50 MCG/ACT nasal spray Place 2 sprays into both nostrils 2 (two) times daily. 09/09/15  Yes Reyne Dumas, MD  methylphenidate (RITALIN) 10 MG tablet Take 10 mg by mouth 3 (three) times daily.     Yes Historical Provider, MD  Multiple Vitamin (MULTIVITAMIN WITH MINERALS) TABS tablet Take 1 tablet by mouth daily.   Yes Historical Provider, MD  omeprazole (PRILOSEC) 20 MG capsule  Take 20 mg by mouth 4 (four) times daily.    Yes Historical Provider, MD  Probiotic Product (PROBIOTIC DAILY) CAPS Take 1 capsule by mouth daily. Reported on 08/21/2015   Yes Historical Provider, MD  Respiratory Therapy Supplies (FLUTTER) DEVI Use as directed 08/21/15  Yes Tanda Rockers, MD  simvastatin (ZOCOR) 20 MG tablet Take 20 mg by mouth at bedtime.     Yes Historical Provider, MD  acetaminophen-codeine (TYLENOL #3) 300-30 MG tablet One every 4 hours as needed for cough Patient not taking: Reported on 10/20/2015 10/07/15   Tanda Rockers, MD     Allergies  Allergen Reactions  . Ciprofloxacin Hcl Other (See Comments)    Burning in ear when drops were used  . Asa [Aspirin]   . Gabapentin     Weakness and dizziness    Social History:  reports that he quit smoking about 28 years ago. His smoking use included Cigarettes. He has a 36 pack-year smoking history. He has quit using smokeless tobacco. He reports that he drinks about 0.6 oz of alcohol per week. He reports that he uses illicit drugs (Marijuana).    Family History  Problem Relation Age of Onset  . Lung cancer Father 23    smoked  . Cancer Brother     prostate  . Heart Problems Mother   . Heart Problems Father        Physical Exam:  GEN:    Pleasant   65 y.o. male examined and in no acute distress; cooperative with exam Filed Vitals:   10/20/15 1538 10/20/15 1843 10/20/15 1940 10/20/15 2052  BP: 127/80 114/83 142/106   Pulse: 100 93 101   Temp: 98.5 F (36.9 C)   98.5 F (36.9 C)  TempSrc: Oral     Resp: 20 16    SpO2: 99% 92% 100%    Blood pressure 142/106, pulse 101, temperature 98.5 F (36.9 C), temperature source Oral, resp. rate 16, SpO2 100 %. PSYCH: He is alert and oriented x4; does not appear anxious does not appear depressed; affect is normal HEENT: Normocephalic and Atraumatic, Mucous membranes pink; PERRLA; EOM intact; Fundi:  Benign;  No scleral icterus, Nares: Patent, Oropharynx: Clear,    Neck:   FROM, No Cervical Lymphadenopathy nor Thyromegaly or Carotid Bruit; No JVD; Breasts:: Not examined CHEST WALL: No tenderness CHEST: Normal respiration, clear to auscultation bilaterally HEART: Regular rate and rhythm; no murmurs rubs or gallops BACK: No kyphosis or scoliosis; No CVA tenderness ABDOMEN: Positive Bowel Sounds, Scaphoid, Soft Non-Tender, No Rebound or Guarding; No Masses, No Organomegaly, No Pannus; No Intertriginous candida. Rectal Exam: Not done EXTREMITIES: No Cyanosis, Clubbing, or Edema; No Ulcerations. Genitalia: not examined PULSES: 2+ and symmetric SKIN: Normal hydration no rash or ulceration CNS:  Alert and Oriented x 4,  No Focal Deficits Mental Status:  Alert, Oriented, Thought Content Appropriate. Speech Fluent without evidence of Aphasia. Able to follow 3 step commands without difficulty.  In No obvious pain.   Cranial Nerves:  II: Discs flat bilaterally; Visual fields Intact, Pupils equal and reactive.    III,IV, VI: Extra-ocular motions intact bilaterally    V,VII: smile symmetric, facial light touch sensation normal bilaterally    VIII: hearing decreased in Left Ear (Hearing Aid Present)   IX,X: gag reflex present    XI: bilateral shoulder shrug    XII: midline tongue extension   Motor:  Right:  Upper extremity 5/5     Left:  Upper extremity 5/5     Right:  Lower extremity 5/5    Left:  Lower extremity 5/5     Tone and Bulk:  normal tone throughout; no atrophy noted   Sensory:  Pinprick and light touch intact throughout, bilaterally   Deep Tendon Reflexes: 2+ and symmetric throughout   Plantars/ Babinski:  Right: normal Left: equivocal or upgoing or normal    Cerebellar:  Finger to nose without difficulty.   Gait: deferred    Vascular: pulses palpable throughout    Labs on Admission:  Basic Metabolic Panel:  Recent Labs Lab 10/20/15 1611  NA 138  K 4.1  CL 101  CO2 27  GLUCOSE 104*  BUN 14  CREATININE 0.96  CALCIUM 9.2   Liver  Function Tests:  Recent Labs Lab 10/20/15 1611  AST 25  ALT 29  ALKPHOS 98  BILITOT 0.6  PROT 7.1  ALBUMIN 3.7   No results for input(s): LIPASE, AMYLASE in the last 168 hours. No results for input(s): AMMONIA in the last 168 hours. CBC:  Recent Labs Lab 10/20/15 1611  WBC 11.2*  NEUTROABS 7.1  HGB 14.5  HCT 44.3  MCV 90.8  PLT 283   Cardiac Enzymes:  Recent Labs Lab 10/20/15 2050  CKTOTAL 50    BNP (last 3 results)  Recent Labs  09/04/15 1012  BNP 113.3*    ProBNP (last 3 results) No results for input(s): PROBNP in the last 8760 hours.  CBG: No results for input(s): GLUCAP in the last 168 hours.  Radiological Exams on Admission: No results found.   EKG: Independently reviewed.    Assessment/Plan:   65 y.o. male with  Active Problems:    Choreiform movements   Neurology consulted and following Patient   Neuro Checks   CT of Head and MRI/MRA of Brain ordered   ESR ordered      DVT Prophylaxis   Lovenox     Code Status:     FULL CODE   Family Communication:  No Family Present    Disposition Plan:   Observation Status        Time spent:  24 Minutes      Theressa Millard [facility] Pager [phone]   If [place] Please Contact the Day Rounding Team MD for [facility]  If 7PM-7AM, Please Contact Night-Floor Coverage  [email or web address] Password [email or web address] 10/20/2015, 11:15 PM     ADDENDUM:   Patient was seen and examined on 10/20/2015

## 2015-10-20 NOTE — ED Notes (Addendum)
Pt reports recent hospital admission from pneumonia and flu, is getting PT/OT and recovering fine. Pt reports having recent weakness/spasms to left leg which has now also increased to left arm. Having difficulty ambulating. Having recent non productive cough.

## 2015-10-20 NOTE — ED Provider Notes (Signed)
CSN: [id number]     Arrival date & time 10/20/15  1440 History   First MD Initiated Contact with Patient 10/20/15 1922     Chief Complaint  Patient presents with  . Weakness     (Consider location/radiation/quality/duration/timing/severity/associated sxs/prior Treatment) HPI Comments: Pt comes in with cc of weakness and inability to walk. He was recently admitted to the hospital for Pneumonia. Pt lost a lot of weight and was profoundly weak, and required home PT and OT. He was doing well, had started walking using a walker and more recently cane in the house until this weekend. PT now notices uncontrolled activity of his L leg initially and now both legs, when he is walking and offloads from the extremity. Pt also had similar symptoms with his arm - L side, which is when he decided to come to the ER. No falls. No numbness.  ROS 10 Systems reviewed and are negative for acute change except as noted in the HPI.     Patient is a 65 y.o. male presenting with weakness. The history is provided by the patient.  Weakness    Past Medical History  Diagnosis Date  . Pneumonia 2009  . 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 Mitral valve prolapse     does not see cardiologist for. last stress test 2003  . Hypercalcemia   . Arthritis   . GERD (gastroesophageal reflux disease)   . Lipoma     left forearm (3)  . Dysrhythmia     ":Due to MVP"  . Bronchitis     history of  . Tumor cells, benign     lung, left side  . Hiatal hernia    Past Surgical History  Procedure Laterality Date  . Cholecystectomy  2002  . Mastoidectomy  1996  . Appendectomy  1984  . Cataract extraction      L and R eye  . Parathyroidectomy    . Nasal septum surgery      sinus surgery  . Total hip revision  06/14/2011    Procedure: TOTAL HIP REVISION;  Surgeon: Kerin Salen;  Location: [facility];  Service: Orthopedics;  Laterality: Left;  Left Acetabular  Hip Revision  . Joint replacement      Lt hip    Family History  Problem Relation Age of Onset  . Lung cancer Father 83    smoked  . Cancer Brother     prostate  . Heart Problems Mother   . Heart Problems Father    Social History  Substance Use Topics  . Smoking status: Former Smoker -- 1.50 packs/day for 24 years    Types: Cigarettes    Quit date: 07/06/1987  . Smokeless tobacco: Former Systems developer  . Alcohol Use: 0.6 oz/week    1 Cans of beer per week    Review of Systems  Neurological: Positive for weakness.      Allergies  Ciprofloxacin hcl; Asa; and Gabapentin  Home Medications   Prior to Admission medications   Medication Sig Start Date End Date Taking? Authorizing Provider  ALPRAZolam Duanne Moron) 0.5 MG tablet Take 1 tablet (0.5 mg total) by mouth at bedtime as needed for anxiety. 08/26/15  Yes Tanda Rockers, MD  buPROPion [facility] SR) 200 MG 12 hr tablet Take 200 mg by mouth 2 (two) times daily.   Yes Historical Provider, MD  dextromethorphan (DELSYM) 30 MG/5ML liquid Take 10 mLs by mouth every 12 (twelve) hours as needed for cough.  Yes Historical Provider, MD  famotidine (PEPCID) 20 MG tablet Take 1 tablet (20 mg total) by mouth at bedtime. 09/09/15  Yes Reyne Dumas, MD  [place] 2 each by mouth daily.   Yes Historical Provider, MD  FLUoxetine (PROZAC) 40 MG capsule Take 40 mg by mouth daily.     Yes Historical Provider, MD  fluticasone (FLONASE) 50 MCG/ACT nasal spray Place 2 sprays into both nostrils 2 (two) times daily. 09/09/15  Yes Reyne Dumas, MD  methylphenidate (RITALIN) 10 MG tablet Take 10 mg by mouth 3 (three) times daily.     Yes Historical Provider, MD  Multiple Vitamin (MULTIVITAMIN WITH MINERALS) TABS tablet Take 1 tablet by mouth daily.   Yes Historical Provider, MD  omeprazole (PRILOSEC) 20 MG capsule Take 20 mg by mouth 4 (four) times daily.    Yes Historical Provider, MD  Probiotic Product (PROBIOTIC DAILY) CAPS Take 1 capsule by mouth daily. Reported on 08/21/2015   Yes  Historical Provider, MD  Respiratory Therapy Supplies (FLUTTER) DEVI Use as directed 08/21/15  Yes Tanda Rockers, MD  simvastatin (ZOCOR) 20 MG tablet Take 20 mg by mouth at bedtime.     Yes Historical Provider, MD  acetaminophen-codeine (TYLENOL #3) 300-30 MG tablet One every 4 hours as needed for cough Patient not taking: Reported on 10/20/2015 10/07/15   Tanda Rockers, MD   BP 114/72 mmHg  Pulse 85  Temp(Src) 98.5 F (36.9 C) (Oral)  Resp 15  SpO2 97% Physical Exam  Constitutional: He is oriented to person, place, and time. He appears well-developed and well-nourished.  HENT:  Head: Normocephalic and atraumatic.  Eyes: EOM are normal. Pupils are equal, round, and reactive to light.  Neck: Normal range of motion. Neck supple. No JVD present.  Cardiovascular: Normal rate and regular rhythm.   Pulmonary/Chest: Effort normal and breath sounds normal. No respiratory distress. He has no wheezes.  Abdominal: Soft. Bowel sounds are normal. He exhibits no distension. There is no tenderness. There is no rebound and no guarding.  Neurological: He is alert and oriented to person, place, and time. No cranial nerve deficit. Coordination normal.  NIHSS - 0 No objective sensory deficits, Motor strength upper and lower extremity 4+ and equal Normal cerebellar exam Lower extremity joints slightly rigid  Pt was able to get up for me, with some difficulty, he was able to march for me while stationary, but as soon as we asked him to take a step, he started having uncontrolled shaking of his LLE andwe had to hold him and ease him in to the bed.  Skin: Skin is warm and dry.  Nursing note and vitals reviewed.   ED Course  Procedures (including critical care time) Labs Review Labs Reviewed  CBC - Abnormal; Notable for the following:    WBC 11.2 (*)    All other components within normal limits  COMPREHENSIVE METABOLIC PANEL - Abnormal; Notable for the following:    Glucose, Bld 104 (*)    All other  components within normal limits  PROTIME-INR  APTT  DIFFERENTIAL  CK  I-STAT TROPOININ, ED    Imaging Review No results found. I have personally reviewed and evaluated these images and lab results as part of my medical decision-making.   EKG Interpretation None      MDM   Final diagnoses:  Hyperkinetic disorder   PT comes in with cc of weakness and inability to ambulate. His neuro exam is non focal, however, when  we try to ambulate him his leg starts having almost choreiform movement.  Reviewed his meds- outside of xanax, no new psych meds. Clinical concerns are for movement disorder. PT is unable to walk, and so we will admit.  Neuro recommends admission, they think pt is having orthostasis. They have ordered further workup.    Varney Biles, MD 10/21/15 [phone]

## 2015-10-20 NOTE — Consult Note (Addendum)
Neurology Consultation Reason for Consult: TREMOR Referring Physician: ED  CC: left leg shaking when he stands  History is obtained from pateint and wife  HPI: [name] is a 65 y.o. male with complex medical hx including a recent PNA that force the pt to be in the hospital for 9 days.  He was discharged 1 month ago with home PT.  He has been making some progress but since Saturday he has regresed in several ways.  He has felt somewhat confused intermittently, his speech is faulty and when he stands his left side shakes.  Started with the leg but earlier has a similar spell in the arm.  This only happens when he is standing.  The shaking can be very violent.  It goes away when he changes position but on my exam I can elicit by forcing the patient to exert with the left leg.  He has had the cough for the last month and this has not really improved much although he no linger has PNA by imaging.  On Saturday he also felt confused at a movie theater, when he thoguht he momentaily that he was part of the movie. Today the shaking of the left leg seemed to be worse and this is the reason he came to the ED.  Denies hx of stroke. 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  Does not take antiplatelets at basline.  Gives a hsitory of orthostatic hypotenison with drops over 30 points between sitting and standing.  Please note that also provides hx of MS dx sometime in his life.   ROS: A 14 point ROS was performed and is negative except as noted in the HPI.  Past Medical History  Diagnosis Date  . Pneumonia 2009  . 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 Mitral valve prolapse     does not see cardiologist for. last stress test 2003  . Hypercalcemia   . Arthritis   . GERD (gastroesophageal reflux disease)   . Lipoma     left forearm (3)  . Dysrhythmia     ":Due to MVP"  . Bronchitis     history of  . Tumor cells, benign     lung, left side  . Hiatal hernia      Family History  Problem Relation Age of Onset  . Lung cancer Father 57    smoked  . Cancer Brother     prostate  . Heart Problems Mother   . Heart Problems Father     Social History:  reports that he quit smoking about 28 years ago. His smoking use included Cigarettes. He has a 36 pack-year smoking history. He has quit using smokeless tobacco. He reports that he drinks about 0.6 oz of alcohol per week. He reports that he uses illicit drugs (Marijuana).  Exam: Current vital signs: BP 142/106 mmHg  Pulse 101  Temp(Src) 98.5 F (36.9 C) (Oral)  Resp 16  SpO2 100% Vital signs in last 24 hours: Temp:  [98.5 F (36.9 C)] 98.5 F (36.9 C) (04/17 2052) Pulse Rate:  [93-101] 101 (04/17 1940) Resp:  [16-20] 16 (04/17 1843) BP: (114-142)/(80-106) 142/106 mmHg (04/17 1940) SpO2:  [92 %-100 %] 100 % (04/17 1940)   Physical Exam  Constitutional: Appears well-developed but somewhat frail  Psych: Affect appropriate to situation Eyes: No scleral injection HENT: No OP obstrucion Head: Normocephalic.  Cardiovascular: Normal rate and regular rhythm.  Respiratory: Effort normal and breath  sounds normal to anterior ascultation GI: Soft.  No distension. There is no tenderness.  Skin: WDI  Neuro: Mental Status: Patient is awake, alert, oriented to person, place, month, year, and situation Patient is able to give a clear and coherent history No signs of aphasia or neglect - he is slightly dysarthric, appears to be 2/2 mouth dryness Cranial Nerves: II: Visual Fields are full. Pupils are equal, round, and reactive to light.  III,IV, VI: EOMI without ptosis or diploplia.  V: Facial sensation is symmetric to temperature VII: Facial movement is symmetric.  VIII: hearing is intact to voice - decreased bilaterally, baseline X: Uvula elevates symmetrically XI: Shoulder shrug is symmetric. XII: tongue is midline without atrophy or fasciculations.  Motor: There is no focal weakness, no  asterixis, the tone is normal globally but on standing there is in fact violent shaking of left leg in a crescendo pattern.  Can be elicited by having the patient elevate leg while lying down as well if done for prolonged time. Sensory: Sensation is symmetric to light touch and temperature in the arms and legs Deep Tendon Reflexes: 2+ and symmetric in the biceps and patellae Plantars: Toes are downgoing bilaterally Cerebellar: FNF and HKS are intact bilaterally    I have reviewed labs in epic and the results pertinent to this consultation are: normal renal function.  I have reviewed the images obtained CTA head.  MRI brain  Impression: This patient requires admission as he is at this point not able to walk and he will need inpateint PT eval.  I am also highly concerned about limb shaking TIA 2/2 severe right carotid stenosis and I am ordering a STAT CTA.  Also will order MRI brain and soon once I have head imaging will consider antiplatelets.  Orthostatic BP q4h x 3 measurements for consistency.  Further recs will be given after imaging.  Please note that if CTA and MRI negative the pateint will need C spine MRI as well.  Pt also has some degree of encephalopathy which may or may not be explained by brain MRI.  Will follow.  Dr Nyoka Cowden will follow after tomorrow  Recommendations: 1) as above

## 2015-10-21 ENCOUNTER — Encounter: Payer: Medicare Other | Admitting: Adult Health

## 2015-10-21 ENCOUNTER — Inpatient Hospital Stay (HOSPITAL_COMMUNITY): Payer: Medicare Other

## 2015-10-21 DIAGNOSIS — G255 Other chorea: Secondary | ICD-10-CM | POA: Diagnosis not present

## 2015-10-21 DIAGNOSIS — R258 Other abnormal involuntary movements: Secondary | ICD-10-CM | POA: Diagnosis not present

## 2015-10-21 DIAGNOSIS — F909 Attention-deficit hyperactivity disorder, unspecified type: Secondary | ICD-10-CM | POA: Insufficient documentation

## 2015-10-21 DIAGNOSIS — M4802 Spinal stenosis, cervical region: Secondary | ICD-10-CM | POA: Diagnosis not present

## 2015-10-21 DIAGNOSIS — E440 Moderate protein-calorie malnutrition: Secondary | ICD-10-CM | POA: Insufficient documentation

## 2015-10-21 DIAGNOSIS — R410 Disorientation, unspecified: Secondary | ICD-10-CM | POA: Diagnosis not present

## 2015-10-21 DIAGNOSIS — R251 Tremor, unspecified: Secondary | ICD-10-CM | POA: Diagnosis not present

## 2015-10-21 LAB — LIPID PANEL
Cholesterol: 189 mg/dL (ref 0–200)
HDL: 43 mg/dL (ref >40)
LDL CALC: 124 mg/dL — AB (ref 0–99)
TRIGLYCERIDES: 110 mg/dL (ref <150)
Total CHOL/HDL Ratio: 4.4 RATIO
VLDL: 22 mg/dL (ref 0–40)

## 2015-10-21 MED ORDER — ALPRAZOLAM 0.5 MG PO TABS
0.5000 mg | ORAL_TABLET | Freq: Every evening | ORAL | Status: DC | PRN
  Administered 2015-10-21: 0.5 mg via ORAL
  Filled 2015-10-21: qty 1

## 2015-10-21 MED ORDER — TAMSULOSIN HCL 0.4 MG PO CAPS
0.4000 mg | ORAL_CAPSULE | Freq: Every day | ORAL | Status: DC
  Administered 2015-10-21: 0.4 mg via ORAL
  Filled 2015-10-21: qty 1

## 2015-10-21 MED ORDER — FLUOXETINE HCL 20 MG PO CAPS
40.0000 mg | ORAL_CAPSULE | Freq: Every day | ORAL | Status: DC
  Administered 2015-10-21 – 2015-10-22 (×2): 40 mg via ORAL
  Filled 2015-10-21 (×2): qty 2

## 2015-10-21 MED ORDER — ENSURE ENLIVE PO LIQD
237.0000 mL | Freq: Two times a day (BID) | ORAL | Status: DC
  Administered 2015-10-21 – 2015-10-22 (×3): 237 mL via ORAL
  Filled 2015-10-21 (×5): qty 237

## 2015-10-21 MED ORDER — BUPROPION HCL ER (SR) 100 MG PO TB12
200.0000 mg | ORAL_TABLET | Freq: Two times a day (BID) | ORAL | Status: DC
  Administered 2015-10-21 – 2015-10-22 (×2): 200 mg via ORAL
  Filled 2015-10-21 (×3): qty 2

## 2015-10-21 MED ORDER — METHYLPHENIDATE HCL 5 MG PO TABS
10.0000 mg | ORAL_TABLET | Freq: Three times a day (TID) | ORAL | Status: DC
  Administered 2015-10-21 – 2015-10-22 (×4): 10 mg via ORAL
  Filled 2015-10-21 (×4): qty 2

## 2015-10-21 MED ORDER — SENNOSIDES-DOCUSATE SODIUM 8.6-50 MG PO TABS: 1.0000 | ORAL_TABLET | Freq: Every evening | ORAL | Status: DC | PRN

## 2015-10-21 MED ORDER — PANTOPRAZOLE SODIUM 40 MG PO TBEC
40.0000 mg | DELAYED_RELEASE_TABLET | Freq: Every day | ORAL | Status: DC
  Administered 2015-10-21 – 2015-10-22 (×2): 40 mg via ORAL
  Filled 2015-10-21 (×2): qty 1

## 2015-10-21 MED ORDER — IOPAMIDOL (ISOVUE-370) INJECTION 76%
INTRAVENOUS | Status: AC
Start: 2015-10-21 — End: 2015-10-21
  Administered 2015-10-21: 50 mL
  Filled 2015-10-21: qty 50

## 2015-10-21 MED ORDER — STROKE: EARLY STAGES OF RECOVERY BOOK
Freq: Once | Status: AC
  Administered 2015-10-21: 04:00:00

## 2015-10-21 MED ORDER — SACCHAROMYCES BOULARDII 250 MG PO CAPS
500.0000 mg | ORAL_CAPSULE | Freq: Every day | ORAL | Status: DC
  Administered 2015-10-21 – 2015-10-22 (×2): 500 mg via ORAL
  Filled 2015-10-21 (×2): qty 2

## 2015-10-21 MED ORDER — FLUTICASONE PROPIONATE 50 MCG/ACT NA SUSP
2.0000 | Freq: Two times a day (BID) | NASAL | Status: DC
  Administered 2015-10-21 – 2015-10-22 (×2): 2 via NASAL
  Filled 2015-10-21: qty 16

## 2015-10-21 MED ORDER — ENOXAPARIN SODIUM 30 MG/0.3ML ~~LOC~~ SOLN
30.0000 mg | SUBCUTANEOUS | Status: DC
  Administered 2015-10-21 – 2015-10-22 (×2): 30 mg via SUBCUTANEOUS
  Filled 2015-10-21 (×3): qty 0.3

## 2015-10-21 MED ORDER — GADOBENATE DIMEGLUMINE 529 MG/ML IV SOLN
14.0000 mL | Freq: Once | INTRAVENOUS | Status: AC | PRN
  Administered 2015-10-21: 15 mL via INTRAVENOUS

## 2015-10-21 MED ORDER — SIMVASTATIN 20 MG PO TABS
20.0000 mg | ORAL_TABLET | Freq: Every day | ORAL | Status: DC
  Administered 2015-10-21: 20 mg via ORAL
  Filled 2015-10-21: qty 1

## 2015-10-21 NOTE — Care Management Note (Signed)
Case Management Note  Patient Details  Name: [name] MRN: [id number] Date of Birth: 03-26-1951  Subjective/Objective:                    Action/Plan: Patient presented with involuntary arm and leg movements, encephalopathy.  Lives at home with spouse. Will follow for discharge needs pending patient's progress, PT/OT evals and physician orders.  Expected Discharge Date:                  Expected Discharge Plan:     In-House Referral:     Discharge planning Services     Post Acute Care Choice:    Choice offered to:     DME Arranged:    DME Agency:     HH Arranged:    HH Agency:     Status of Service:  In process, will continue to follow  Medicare Important Message Given:    Date Medicare IM Given:    Medicare IM give by:    Date Additional Medicare IM Given:    Additional Medicare Important Message give by:     If discussed at [place] of Stay Meetings, dates discussed:    Additional Comments:  Rolm Baptise, RN 10/21/2015, 11:15 AM [phone]

## 2015-10-21 NOTE — Progress Notes (Signed)
Initial Nutrition Assessment  DOCUMENTATION CODES:   Non-severe (moderate) malnutrition in context of chronic illness  INTERVENTION:  Provide Ensure Enlive po BID, each supplement provides 350 kcal and 20 grams of protein Provide Snacks BID Encourage PO intake   NUTRITION DIAGNOSIS:   Malnutrition related to chronic illness, poor appetite as evidenced by moderate depletion of body fat, moderate depletions of muscle mass, percent weight loss.   GOAL:   Patient will meet greater than or equal to 90% of their needs   MONITOR:   PO intake, Supplement acceptance, Labs, Skin, I & O's, Weight trends  REASON FOR ASSESSMENT:   Malnutrition Screening Tool    ASSESSMENT:   65 y.o. male with recent admission for Pneumonia on home PT for deconditioning who began to have an episode of uncontrolled activity of his Left leg then his Left arm, and into his right leg.  Per weight history pt has lost from 167 lbs to 150 lbs in the past 6 months- 10% weight loss. Pt states that he used to weigh 185 lbs, years ago, and has gradually lost weight due to hip replacements and hospitalization for bowel obstruction. He reports losing down to 138 lbs recently during treatment for bronchiolitis; he states he has been eating well and is unsure why he lost so much. He has been making an effort to gain weight by eating 5 times daily and drinking one 400 kcal soy protein shake daily (he is lactose intolerant). He has to eat small amounts and avoid certain foods for his GERD. Pt reports having a good appetite and eating well PTA. Pt has moderate muscle wasting and moderate fat wasting per nutrition-focused physical exam. Pt agreeable to drinking Ensure Enlive while admitted. RD encouraged increasing protein shake at home to twice daily.  Labs reviewed.   Diet Order:  Diet regular Room service appropriate?: Yes; Fluid consistency:: Thin  Skin:  Reviewed, no issues  Last BM:  4/18  Height:   Ht Readings  from Last 1 Encounters:  10/21/15 5\' 11"  (1.803 m)    Weight:   Wt Readings from Last 1 Encounters:  10/21/15 150 lb 6.4 oz (68.221 kg)    Ideal Body Weight:  78.2 kg  BMI:  Body mass index is 20.99 kg/(m^2).  Estimated Nutritional Needs:   Kcal:  2100-2400  Protein:  90-100 grams  Fluid:  2.1 L/day  EDUCATION NEEDS:   No education needs identified at this time  [place], LDN Inpatient Clinical Dietitian Pager: [phone] After Hours Pager: [phone]

## 2015-10-21 NOTE — Progress Notes (Signed)
STROKE TEAM PROGRESS NOTE   HISTORY OF PRESENT ILLNESS [name] is a 65 y.o. male with complex medical hx including a recent PNA that forced the pt to be in the hospital for 9 days. He was discharged 1 month ago with home PT. He has been making some progress but since Saturday he has regressed in several ways. He has felt somewhat confused intermittently, his speech is faulty and when he stands his left side shakes. Started with the leg but earlier had a similar spell in the arm. This only happens when he is standing. The shaking can be very violent. It goes away when he changes position but on exam can elicit by forcing the patient to exert with the left leg. He has had the cough for the last month and this has not really improved much although he no linger has PNA by imaging. On Saturday he also felt confused at a movie theater, when he thoguht momentaily that he was part of the movie. Today the shaking of the left leg seemed to be worse and this is the reason he came to the ED. Denies hx of stroke. 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 Does not take antiplatelets at basline. Gives a hsitory of orthostatic hypotenison with drops over 30 points between sitting and standing. Please note that also provides hx of MS dx sometime in his life.  Patient was not administered IV tPA. He was admitted for further evaluation and treatment.   SUBJECTIVE (INTERVAL HISTORY) His wife and NT are at the bedside.  Overall he feels his condition is stable. He has had no more episodes since yesterday except the ones elicited by staff. He asked if it could be functional?   OBJECTIVE Temp:  [97.8 F (36.6 C)-99.2 F (37.3 C)] 97.8 F (36.6 C) (04/18 0618) Pulse Rate:  [81-101] 84 (04/18 0618) Cardiac Rhythm:  [-] Normal sinus rhythm (04/18 0730) Resp:  [15-26] 18 (04/18 0618) BP: (104-142)/(63-106) 104/63 mmHg (04/18 0618) SpO2:  [92 %-100 %] 97 % (04/18 0618) Weight:   [68.221 kg (150 lb 6.4 oz)] 68.221 kg (150 lb 6.4 oz) (04/18 0231)  CBC:   Recent Labs Lab 10/20/15 1611  WBC 11.2*  NEUTROABS 7.1  HGB 14.5  HCT 44.3  MCV 90.8  PLT [id number]    Basic Metabolic Panel:   Recent Labs Lab 10/20/15 1611  NA 138  K 4.1  CL 101  CO2 27  GLUCOSE 104*  BUN 14  CREATININE 0.96  CALCIUM 9.2    Lipid Panel: No results found for: CHOL, TRIG, HDL, CHOLHDL, VLDL, LDLCALC HgbA1c:  Lab Results  Component Value Date   HGBA1C 6.0* 02/15/2013   Urine Drug Screen: No results found for: LABOPIA, COCAINSCRNUR, LABBENZ, AMPHETMU, THCU, LABBARB    IMAGING  CTA NECK  10/21/2015   1. Normal CTA of the neck. 2. Patchy multifocal ground-glass and nodular opacities within the partially visualized lungs, nonspecific, but may reflect sequela of multifocal infection. 3. Subacute fracture of the lateral right second rib. Query recent trauma.   CTA HEAD  10/21/2015   1. Normal for age CTA of the head. No large or proximal arterial branch occlusion. No high-grade or correctable stenosis. 2. Mild atheromatous irregularity within the cavernous ICAs bilaterally without significant stenosis.    MRI brain  MRI C Spine    PHYSICAL EXAM Frail middle-aged Caucasian male not in distress. . Afebrile. Head is nontraumatic. Neck is supple without bruit.  Cardiac exam no murmur or gallop. Lungs are clear to auscultation. Distal pulses are well felt. Neurological Exam :   Awake  Alert oriented x 3. Normal speech and language.eye movements full without nystagmus.fundi were not visualized. Vision acuity and fields appear normal. Hearing is normal. Palatal movements are normal. Face symmetric. Tongue midline. Normal strength, tone, reflexes and coordination . Except mild giveaway weakness of the left hip flexors only. Ankle jerks are both depressed compared to knee jerks.. Normal sensation. Gait deferred. ASSESSMENT/PLAN [name] is a 65 y.o. male with history of recent  admission for PNA and flu presenting with confusion and L leg shaking when he stands. He did not receive IV t-PA.   Odd presentation of confusion and L body shaking. Doubt TIA . Involuntary leg movements ? etiology  MRI brain pending   MRI C Spine  pending   CTA head normal for age  CTA neck normal CTA. Lung w/ ground glass and nodular opacities. Subacute fx R 2nd rib.  2D Echo  pending   LDL pending   HgbA1c pending  Lovenox 30 mg sq daily for VTE prophylaxis Diet regular Room service appropriate?: Yes; Fluid consistency:: Thin  No antithrombotic prior to admission, now on No antithrombotic. Allergic to aspirin. No clear stroke at this time.  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 I have made any additions or clarifications directly to the above note. Agree with note above. He presented with transient episode of left leg and hand shaking, trembling and involuntary movements with lack of coordination yesterday related to performing some activities. It is unclear that this represents TIA or action myoclonus or postural tremors as etiology. He however remains at risk for recurrent episodes, stroke, TIA needs to complete  ongoing stroke evaluation. I had a long 20 minute discussion at the bedside with the patient and his wife with regards to his episodes, discussed differential diagnosis, plan for evaluation, treatment and answered questions. CT angiogram is negative for significant extracranial or intracranial carotid stenosis. Check MRI scan of the brain and cervical spine for other etiologies.   Richard Contras, MD Medical Director [facility] Pager: [phone] 10/21/2015 2:59 PM    To contact Stroke Continuity provider, please refer to [email or web address] After hours, contact General Neurology

## 2015-10-21 NOTE — ED Notes (Signed)
Patient transported to CT 

## 2015-10-21 NOTE — Evaluation (Signed)
Physical Therapy Evaluation Patient Details Name: [name] MRN: [id number] DOB: 06-01-1951 Today's Date: 10/21/2015   History of Present Illness  Pt is a 65 y/o M presenting w/ involuntary UE and LE movements.  T w/ recent admission for pneumonia.  Pt's PMH includes Lt THA, Lt lung tumor, parathyroid tumor, mastoidectomy. Awaiting brain MRI.    Clinical Impression  Pt currently with functional limitations due to the deficits listed below (see PT Problem List). Mr. Vanderzanden presents w/ spontaneous onset of choreic movements and spasms in Lt LE>Rt LE.  He currently requires min assist to steady w/ transfers and ambulation.  Pt reports that his symptoms seem to be very slowly resolving. Pt will benefit from skilled PT to increase their independence and safety with mobility to allow discharge to the venue listed below.      Follow Up Recommendations Home health PT;Supervision for mobility/OOB    Equipment Recommendations  None recommended by PT    Recommendations for Other Services       Precautions / Restrictions Precautions Precautions: Fall Restrictions Weight Bearing Restrictions: No      Mobility  Bed Mobility Overal bed mobility: Modified Independent             General bed mobility comments: Use of bed rail w/ increased effort.  No physical assist or cues needed.  Transfers Overall transfer level: Needs assistance Equipment used: Rolling walker (2 wheeled) Transfers: Sit to/from Stand Sit to Stand: Min assist         General transfer comment: Min assist to steady due to onset of choreic movements Bil LEs and UEs upon standing which resolve quickly.   Ambulation/Gait Ambulation/Gait assistance: Min assist Ambulation Distance (Feet): 60 Feet Assistive device: Rolling walker (2 wheeled) Gait Pattern/deviations: Ataxic;Trunk flexed;Antalgic;Staggering left;Staggering right;Decreased stride length   Gait velocity interpretation: Below normal speed for  age/gender General Gait Details: Spontaneous onset of increasing choreic activity Bil LEs and UEs while ambulating using RW and requiring min assist to steady.  After ambulating 60 ft pt reports his Lt quad in spasm and requests to sit.  Stairs            Wheelchair Mobility    Modified Rankin (Stroke Patients Only) Modified Rankin (Stroke Patients Only) Pre-Morbid Rankin Score: Moderate disability Modified Rankin: Moderately severe disability     Balance Overall balance assessment: Needs assistance Sitting-balance support: Feet supported;No upper extremity supported Sitting balance-Leahy Scale: Good     Standing balance support: Bilateral upper extremity supported;During functional activity Standing balance-Leahy Scale: Poor Standing balance comment: Needs RW due to spontaneous onset of choreic movements                             Pertinent Vitals/Pain Pain Assessment: 0-10 Pain Score: 2  Pain Location: head and neck Pain Descriptors / Indicators: Aching Pain Intervention(s): Limited activity within patient's tolerance;Monitored during session    Home Living Family/patient expects to be discharged to:: Private residence Living Arrangements: Spouse/significant other Available Help at Discharge: Family;Available PRN/intermittently Type of Home: Apartment Home Access: Level entry     Home Layout: One level Home Equipment: Cane - single point;Tub bench;Bedside commode;Walker - 2 wheels Additional Comments: wife is on disability for chronic pain    Prior Function Level of Independence: Independent with assistive device(s)         Comments: Generally deconditioned following recent h/o bowel obstruction, bronchitis, pneumonia.  Has been having HHPT and just this  past week ambulated 0.3 miles using 2 canes w/ HHPT. Has been transitioning to using only one cane inside the home.     Hand Dominance   Dominant Hand: Right    Extremity/Trunk Assessment    Upper Extremity Assessment: Defer to OT evaluation           Lower Extremity Assessment: RLE deficits/detail;LLE deficits/detail RLE Deficits / Details: Knee flexion and extension 3+/5 LLE Deficits / Details: knee flexion and extension 3+/5  Cervical / Trunk Assessment: Kyphotic  Communication   Communication: No difficulties  Cognition Arousal/Alertness: Awake/alert Behavior During Therapy: WFL for tasks assessed/performed Overall Cognitive Status: Within Functional Limits for tasks assessed                      General Comments      Exercises        Assessment/Plan    PT Assessment Patient needs continued PT services  PT Diagnosis Difficulty walking;Abnormality of gait   PT Problem List Decreased strength;Decreased activity tolerance;Decreased balance;Decreased mobility;Decreased coordination;Decreased knowledge of use of DME;Decreased safety awareness  PT Treatment Interventions DME instruction;Gait training;Functional mobility training;Therapeutic activities;Therapeutic exercise;Balance training;Neuromuscular re-education;Patient/family education   PT Goals (Current goals can be found in the Care Plan section) Acute Rehab PT Goals Patient Stated Goal: to go home and continue HHPT PT Goal Formulation: With patient/family Time For Goal Achievement: 11/04/15 Potential to Achieve Goals: Good    Frequency Min 3X/week   Barriers to discharge        Co-evaluation               End of Session Equipment Utilized During Treatment: Gait belt Activity Tolerance: Patient tolerated treatment well;Treatment limited secondary to medical complications (Comment) (Lt quad spasm) Patient left: in chair;with call bell/phone within reach;with chair alarm set;with family/visitor present;with nursing/sitter in room Nurse Communication: Mobility status         Time: [id number] PT Time Calculation (min) (ACUTE ONLY): 24 min   Charges:   PT Evaluation $PT Eval  Low Complexity: 1 Procedure PT Treatments $Gait Training: 8-22 mins   PT G Codes:       Collie Siad PT, DPT  Pager: [phone] Phone: [phone] 10/21/2015, 12:03 PM

## 2015-10-21 NOTE — ED Notes (Signed)
Called CT to inform them of the IV

## 2015-10-21 NOTE — Progress Notes (Signed)
PROGRESS NOTE    [name]  [id number] DOB: Nov 19, 1950 DOA: 10/20/2015 PCP: Lilian Coma, MD     Brief Narrative: [name] is a 65 y.o. male with recent admission for Pneumonia on home PT for deconditioning who began to have an episode of uncontrolled activity of his Left leg then his Left arm, and into his right leg. He was admitted for further eval.   Assessment & Plan:   Active Problems:   Choreiform movements   Malnutrition of moderate degree  Involuntary movements of the left extremity. Of unclear etiology  ? Myoclonus Getting MRI of the brain and c spine.  Neurology consulted and recommendations given.  CTA negative.    BPH: resume flomax  Hyperlipidemia: Resume zocor.    DVT prophylaxis: (Lovenox Code Status: (Full Family Communication: discussed with wife at bedside.  Disposition Plan: pending further work up.    Consultants:   Neurology.    Procedures: MRI BRAIN PENDING.   Antimicrobials: none  Subjective: Reports feeling better.   Objective: Filed Vitals:   10/21/15 0430 10/21/15 0618 10/21/15 1026 10/21/15 1737  BP: 117/78 104/63  103/62  Pulse: 88 84  84  Temp: 98.8 F (37.1 C) 97.8 F (36.6 C) 98.2 F (36.8 C) 98.5 F (36.9 C)  TempSrc: Oral Oral Oral Oral  Resp: 18 18 20 20   Height:      Weight:      SpO2: 99% 97% 100% 98%    Intake/Output Summary (Last 24 hours) at 10/21/15 1905 Last data filed at 10/21/15 1737  Gross per 24 hour  Intake    120 ml  Output    550 ml  Net   -430 ml   Filed Weights   10/21/15 0231  Weight: 68.221 kg (150 lb 6.4 oz)    Examination:  General exam: Appears calm and comfortable  Respiratory system: Clear to auscultation. Respiratory effort normal. Cardiovascular system: S1 & S2 heard, RRR. No JVD, murmurs, rubs, gallops or clicks. No pedal edema. Gastrointestinal system: Abdomen is nondistended, soft and nontender. No organomegaly or masses felt. Normal bowel sounds  heard. Central nervous system: Alert and oriented.  Extremities: Symmetric 5 x 5 power. Skin: No rashes, lesions or ulcers Psychiatry: Judgement and insight appear normal. Mood & affect appropriate.     Data Reviewed: I have personally reviewed following labs and imaging studies  CBC:  Recent Labs Lab 10/20/15 1611  WBC 11.2*  NEUTROABS 7.1  HGB 14.5  HCT 44.3  MCV 90.8  PLT [id number]   Basic Metabolic Panel:  Recent Labs Lab 10/20/15 1611  NA 138  K 4.1  CL 101  CO2 27  GLUCOSE 104*  BUN 14  CREATININE 0.96  CALCIUM 9.2   GFR: Estimated Creatinine Clearance: 74 mL/min (by C-G formula based on Cr of 0.96). Liver Function Tests:  Recent Labs Lab 10/20/15 1611  AST 25  ALT 29  ALKPHOS 98  BILITOT 0.6  PROT 7.1  ALBUMIN 3.7   No results for input(s): LIPASE, AMYLASE in the last 168 hours. No results for input(s): AMMONIA in the last 168 hours. Coagulation Profile:  Recent Labs Lab 10/20/15 1611  INR 1.06   Cardiac Enzymes:  Recent Labs Lab 10/20/15 2050  CKTOTAL 50   BNP (last 3 results) No results for input(s): PROBNP in the last 8760 hours. HbA1C: No results for input(s): HGBA1C in the last 72 hours. CBG: No results for input(s): GLUCAP in the last 168 hours. Lipid Profile:  Recent  Labs  10/21/15 1054  CHOL 189  HDL 43  LDLCALC 124*  TRIG 110  CHOLHDL 4.4   Thyroid Function Tests: No results for input(s): TSH, T4TOTAL, FREET4, T3FREE, THYROIDAB in the last 72 hours. Anemia Panel: No results for input(s): VITAMINB12, FOLATE, FERRITIN, TIBC, IRON, RETICCTPCT in the last 72 hours. Urine analysis:    Component Value Date/Time   COLORURINE AMBER* 08/17/2012 2120   APPEARANCEUR CLOUDY* 08/17/2012 2120   LABSPEC 1.037* 08/17/2012 2120   PHURINE 6.0 08/17/2012 2120   GLUCOSEU NEGATIVE 08/17/2012 2120   HGBUR NEGATIVE 08/17/2012 2120   BILIRUBINUR SMALL* 08/17/2012 2120   KETONESUR 15* 08/17/2012 2120   PROTEINUR 30* 08/17/2012 2120    UROBILINOGEN 1.0 08/17/2012 2120   NITRITE NEGATIVE 08/17/2012 2120   LEUKOCYTESUR TRACE* 08/17/2012 2120   Sepsis Labs: @LABRCNTIP (procalcitonin:4,lacticidven:4)  )No results found for this or any previous visit (from the past 240 hour(s)).       Radiology Studies: Ct Angio Head W/cm &/or Wo Cm  10/21/2015  CLINICAL DATA:  Initial evaluation for left-sided shaking with standing. EXAM: CT ANGIOGRAPHY HEAD AND NECK TECHNIQUE: Multidetector CT imaging of the head and neck was performed using the standard protocol during bolus administration of intravenous contrast. Multiplanar CT image reconstructions and MIPs were obtained to evaluate the vascular anatomy. Carotid stenosis measurements (when applicable) are obtained utilizing NASCET criteria, using the distal internal carotid diameter as the denominator. CONTRAST:  300 cc of Isovue 370. COMPARISON:  Prior CT from 09/21/2010. FINDINGS: CT HEAD Cerebral volume within normal limits for age. No significant white matter disease appreciated. No acute large vessel territory infarct. No acute intracranial hemorrhage. No mass lesion, midline shift or mass effect. No hydrocephalus. No extra-axial fluid collection. Scalp soft tissues within normal limits. No acute abnormality about the orbits. Paranasal sinuses are clear. No mastoid effusion. Sequela of prior mastoidectomy seen on the left. No mastoid effusion. Calvarium intact. CTA NECK Aortic arch: Visualized aortic arch of normal caliber with normal 3 vessel morphology. No high-grade stenosis at the origin of the great vessels. Focal plaque within the proximal right subclavian artery without significant stenosis. Visualized subclavian arteries widely patent. Right carotid system: Right common carotid artery patent from its origin to the bifurcation. No significant atheromatous disease about the right bifurcation. Right ICA widely patent from the bifurcation to the skullbase. No stenosis, dissection, or  vascular occlusion within the right carotid artery system. Right external carotid artery and its branches within normal limits. Left carotid system: Left common carotid artery patent from its origin to the bifurcation. No significant atheromatous disease about the left bifurcation. Left ICA widely patent from the bifurcation to the skullbase. No stenosis, dissection, or vascular occlusion within the left carotid artery system. Left external carotid artery is branches within normal limits. Vertebral arteries:Both vertebral arteries arise from the subclavian arteries. Vertebral arteries widely patent without evidence for dissection, stenosis, or occlusion. Mild irregularity of the distal right vertebral artery favored to be artifactual nature. Skeleton: Subacute appearing fracture of the right lateral second rib present (series 501, image 26). No other acute osseous abnormality. Moderate spondylolysis at C5-6 and C6-7. Other neck: Patchy multi focal ground-glass opacity within the partially visualized upper lobes, nonspecific, but may reflect sequela of multifocal infection. Scattered nodular densities within the right lung apex measure up to 1 cm, nonspecific. Visualized superior mediastinum within normal limits. Postsurgical changes seen at the level of thyroid. Thyroid gland itself is grossly unremarkable. No adenopathy. No acute soft tissue abnormality within the  neck. CTA HEAD Anterior circulation: The petrous segments are widely patent bilaterally. Mild scattered atheromatous plaque within the cavernous ICAs without significant stenosis. Mild irregularity of the distal cavernous left ICA just beyond the anterior genu favored to relate atheromatous disease (series 504, image 144). Supraclinoid segments widely patent. A1 segments, anterior communicating artery, and anterior cerebral arteries well opacified. M1 segments patent bilaterally without stenosis or occlusion. MCA bifurcations normal. Distal MCA branches  symmetric and well opacified bilaterally. Posterior circulation: Vertebral arteries patent to the vertebrobasilar junction. Posterior inferior cerebral arteries patent. Basilar artery widely patent. Superior cerebral arteries well opacified bilaterally. Both posterior cerebral arteries arise in basilar artery are well opacified to their distal aspects. Venous sinuses: Patent without evidence for venous sinus thrombosis. Anatomic variants: No anatomic variant.  No aneurysm. Delayed phase: No abnormal enhancement on delayed sequence. IMPRESSION: CTA NECK IMPRESSION: 1. Normal CTA of the neck. 2. Patchy multifocal ground-glass and nodular opacities within the partially visualized lungs, nonspecific, but may reflect sequela of multifocal infection. 3. Subacute fracture of the lateral right second rib. Query recent trauma. CTA HEAD IMPRESSION: 1. Normal for age CTA of the head. No large or proximal arterial branch occlusion. No high-grade or correctable stenosis. 2. Mild atheromatous irregularity within the cavernous ICAs bilaterally without significant stenosis. Electronically Signed   By: Jeannine Boga M.D.   On: 10/21/2015 03:30   Ct Angio Neck W/cm &/or Wo/cm  10/21/2015  CLINICAL DATA:  Initial evaluation for left-sided shaking with standing. EXAM: CT ANGIOGRAPHY HEAD AND NECK TECHNIQUE: Multidetector CT imaging of the head and neck was performed using the standard protocol during bolus administration of intravenous contrast. Multiplanar CT image reconstructions and MIPs were obtained to evaluate the vascular anatomy. Carotid stenosis measurements (when applicable) are obtained utilizing NASCET criteria, using the distal internal carotid diameter as the denominator. CONTRAST:  300 cc of Isovue 370. COMPARISON:  Prior CT from 09/21/2010. FINDINGS: CT HEAD Cerebral volume within normal limits for age. No significant white matter disease appreciated. No acute large vessel territory infarct. No acute  intracranial hemorrhage. No mass lesion, midline shift or mass effect. No hydrocephalus. No extra-axial fluid collection. Scalp soft tissues within normal limits. No acute abnormality about the orbits. Paranasal sinuses are clear. No mastoid effusion. Sequela of prior mastoidectomy seen on the left. No mastoid effusion. Calvarium intact. CTA NECK Aortic arch: Visualized aortic arch of normal caliber with normal 3 vessel morphology. No high-grade stenosis at the origin of the great vessels. Focal plaque within the proximal right subclavian artery without significant stenosis. Visualized subclavian arteries widely patent. Right carotid system: Right common carotid artery patent from its origin to the bifurcation. No significant atheromatous disease about the right bifurcation. Right ICA widely patent from the bifurcation to the skullbase. No stenosis, dissection, or vascular occlusion within the right carotid artery system. Right external carotid artery and its branches within normal limits. Left carotid system: Left common carotid artery patent from its origin to the bifurcation. No significant atheromatous disease about the left bifurcation. Left ICA widely patent from the bifurcation to the skullbase. No stenosis, dissection, or vascular occlusion within the left carotid artery system. Left external carotid artery is branches within normal limits. Vertebral arteries:Both vertebral arteries arise from the subclavian arteries. Vertebral arteries widely patent without evidence for dissection, stenosis, or occlusion. Mild irregularity of the distal right vertebral artery favored to be artifactual nature. Skeleton: Subacute appearing fracture of the right lateral second rib present (series 501, image 26). No other acute  osseous abnormality. Moderate spondylolysis at C5-6 and C6-7. Other neck: Patchy multi focal ground-glass opacity within the partially visualized upper lobes, nonspecific, but may reflect sequela of  multifocal infection. Scattered nodular densities within the right lung apex measure up to 1 cm, nonspecific. Visualized superior mediastinum within normal limits. Postsurgical changes seen at the level of thyroid. Thyroid gland itself is grossly unremarkable. No adenopathy. No acute soft tissue abnormality within the neck. CTA HEAD Anterior circulation: The petrous segments are widely patent bilaterally. Mild scattered atheromatous plaque within the cavernous ICAs without significant stenosis. Mild irregularity of the distal cavernous left ICA just beyond the anterior genu favored to relate atheromatous disease (series 504, image 144). Supraclinoid segments widely patent. A1 segments, anterior communicating artery, and anterior cerebral arteries well opacified. M1 segments patent bilaterally without stenosis or occlusion. MCA bifurcations normal. Distal MCA branches symmetric and well opacified bilaterally. Posterior circulation: Vertebral arteries patent to the vertebrobasilar junction. Posterior inferior cerebral arteries patent. Basilar artery widely patent. Superior cerebral arteries well opacified bilaterally. Both posterior cerebral arteries arise in basilar artery are well opacified to their distal aspects. Venous sinuses: Patent without evidence for venous sinus thrombosis. Anatomic variants: No anatomic variant.  No aneurysm. Delayed phase: No abnormal enhancement on delayed sequence. IMPRESSION: CTA NECK IMPRESSION: 1. Normal CTA of the neck. 2. Patchy multifocal ground-glass and nodular opacities within the partially visualized lungs, nonspecific, but may reflect sequela of multifocal infection. 3. Subacute fracture of the lateral right second rib. Query recent trauma. CTA HEAD IMPRESSION: 1. Normal for age CTA of the head. No large or proximal arterial branch occlusion. No high-grade or correctable stenosis. 2. Mild atheromatous irregularity within the cavernous ICAs bilaterally without significant  stenosis. Electronically Signed   By: Jeannine Boga M.D.   On: 10/21/2015 03:30        Scheduled Meds: . buPROPion  200 mg Oral BID  . enoxaparin (LOVENOX) injection  30 mg Subcutaneous Q24H  . feeding supplement (ENSURE ENLIVE)  237 mL Oral BID BM  . FLUoxetine  40 mg Oral Daily  . fluticasone  2 spray Each Nare BID  . methylphenidate  10 mg Oral TID  . pantoprazole  40 mg Oral Q0600  . saccharomyces boulardii  500 mg Oral Daily  . simvastatin  20 mg Oral QHS  . tamsulosin  0.4 mg Oral QPC supper   Continuous Infusions:    LOS: 1 day    Time spent: 25 min    Koda Defrank, MD [facility] Pager [phone]   If 7PM-7AM, please contact night-coverage [email or web address] Password [email or web address] 10/21/2015, 7:05 PM

## 2015-10-22 ENCOUNTER — Inpatient Hospital Stay (HOSPITAL_BASED_OUTPATIENT_CLINIC_OR_DEPARTMENT_OTHER): Payer: Medicare Other

## 2015-10-22 DIAGNOSIS — F909 Attention-deficit hyperactivity disorder, unspecified type: Secondary | ICD-10-CM | POA: Diagnosis not present

## 2015-10-22 DIAGNOSIS — G255 Other chorea: Secondary | ICD-10-CM | POA: Diagnosis not present

## 2015-10-22 DIAGNOSIS — G459 Transient cerebral ischemic attack, unspecified: Secondary | ICD-10-CM

## 2015-10-22 DIAGNOSIS — E440 Moderate protein-calorie malnutrition: Secondary | ICD-10-CM

## 2015-10-22 LAB — HEMOGLOBIN A1C
HEMOGLOBIN A1C: 5.9 % — AB (ref 4.8–5.6)
Mean Plasma Glucose: 123 mg/dL

## 2015-10-22 LAB — ECHOCARDIOGRAM COMPLETE
Height: 71 in
WEIGHTICAEL: 2406.4 [oz_av]

## 2015-10-22 NOTE — Progress Notes (Signed)
  Echocardiogram 2D Echocardiogram has been performed.  Jennette Dubin 10/22/2015, 9:33 AM

## 2015-10-22 NOTE — Progress Notes (Signed)
Physical Therapy Treatment Patient Details Name: [name] MRN: [id number] DOB: 1951-02-05 Today's Date: 10/22/2015    History of Present Illness Pt is a 65 y/o M presenting w/ involuntary UE and LE movements.  T w/ recent admission for pneumonia.  Pt's PMH includes Lt THA, Lt lung tumor, parathyroid tumor, mastoidectomy. Awaiting brain MRI.    PT Comments    [name] made good progress today, ambulating w/o choreic activity and min guard assist for safety.  Pt reminded to be cautious at home and to not push himself too far. He should gradually increase his ambulatory distance and functinal acitivites w/ the supervision of his HHPT.    Follow Up Recommendations  Home health PT;Supervision for mobility/OOB     Equipment Recommendations  None recommended by PT    Recommendations for Other Services       Precautions / Restrictions Precautions Precautions: Fall Restrictions Weight Bearing Restrictions: No    Mobility  Bed Mobility               General bed mobility comments: Pt sitting in recliner chair upon PT arrival  Transfers Overall transfer level: Needs assistance Equipment used: Rolling walker (2 wheeled) Transfers: Sit to/from Stand Sit to Stand: Supervision         General transfer comment: Supervison for safety.  No unsteadiness w/ transfer, good technique using RW.  Ambulation/Gait Ambulation/Gait assistance: Min guard Ambulation Distance (Feet): 120 Feet Assistive device: Rolling walker (2 wheeled) Gait Pattern/deviations: Step-through pattern;Narrow base of support   Gait velocity interpretation: at or above normal speed for age/gender General Gait Details: No choreic activity noted while ambulating today.  Min guard assist for safety.  Pt reports fatigue Bil LEs after ambulating 120 ft.     Stairs            Wheelchair Mobility    Modified Rankin (Stroke Patients Only) Modified Rankin (Stroke Patients Only) Pre-Morbid Rankin  Score: Moderate disability Modified Rankin: Moderately severe disability     Balance Overall balance assessment: Needs assistance Sitting-balance support: No upper extremity supported;Feet supported Sitting balance-Leahy Scale: Good     Standing balance support: Bilateral upper extremity supported Standing balance-Leahy Scale: Poor Standing balance comment: RW for support                    Cognition Arousal/Alertness: Awake/alert Behavior During Therapy: WFL for tasks assessed/performed Overall Cognitive Status: Within Functional Limits for tasks assessed                      Exercises      General Comments General comments (skin integrity, edema, etc.): Pt reminded to be cautious at home and to not push himself too far. He should gradually increase his ambulatory distance and functinal acitivites w/ the supervision of his HHPT.      Pertinent Vitals/Pain Pain Assessment: No/denies pain Pain Intervention(s): Limited activity within patient's tolerance;Monitored during session    Home Living                      Prior Function            PT Goals (current goals can now be found in the care plan section) Acute Rehab PT Goals Patient Stated Goal: to go home and continue HHPT PT Goal Formulation: With patient/family Time For Goal Achievement: 11/04/15 Potential to Achieve Goals: Good Progress towards PT goals: Progressing toward goals    Frequency  Min 3X/week  PT Plan Current plan remains appropriate    Co-evaluation             End of Session Equipment Utilized During Treatment: Gait belt Activity Tolerance: Patient tolerated treatment well;Patient limited by fatigue Patient left: in chair;with call bell/phone within reach;with chair alarm set;with family/visitor present     Time: [id number] PT Time Calculation (min) (ACUTE ONLY): 13 min  Charges:  $Gait Training: 8-22 mins                    G Codes:      Collie Siad PT, DPT  Pager: [phone] Phone: [phone] 10/22/2015, 4:29 PM

## 2015-10-22 NOTE — Progress Notes (Signed)
Pt discharged home with family, by car, assessment stable, prescriptions given, all instructions reviewed, all questions answered. IV removed. Telemetry removed. Pt taken by wheelchair to exit. Time of discharge: 1749

## 2015-10-22 NOTE — Care Management Note (Signed)
Case Management Note  Patient Details  Name: [name] MRN: [id number] Date of Birth: 05-May-1951  Subjective/Objective:                    Action/Plan: Patient was active with [facility] for [facility] services prior to admission.  No new orders required, as patient is currently in observation status.  [facility] to resume services at discharge.  Expected Discharge Date:                  Expected Discharge Plan:  [place]  In-House Referral:     Discharge planning Services     Post Acute Care Choice:    Choice offered to:     DME Arranged:    DME Agency:     HH Arranged:    [place] Agency:     Status of Service:  Completed, signed off  Medicare Important Message Given:    Date Medicare IM Given:    Medicare IM give by:    Date Additional Medicare IM Given:    Additional Medicare Important Message give by:     If discussed at [place] of Stay Meetings, dates discussed:    Additional CommentsRolm Baptise, RN 10/22/2015, 4:23 PM [phone]

## 2015-10-22 NOTE — Discharge Instructions (Signed)
Fall Prevention in the Home   Falls can cause injuries. They can happen to people of all ages. There are many things you can do to make your home safe and to help prevent falls.   WHAT CAN I DO ON THE OUTSIDE OF MY HOME?  · Regularly fix the edges of walkways and driveways and fix any cracks.  · Remove anything that might make you trip as you walk through a door, such as a raised step or threshold.  · Trim any bushes or trees on the path to your home.  · Use bright outdoor lighting.  · Clear any walking paths of anything that might make someone trip, such as rocks or tools.  · Regularly check to see if handrails are loose or broken. Make sure that both sides of any steps have handrails.  · Any raised decks and porches should have guardrails on the edges.  · Have any leaves, snow, or ice cleared regularly.  · Use sand or salt on walking paths during winter.  · Clean up any spills in your garage right away. This includes oil or grease spills.  WHAT CAN I DO IN THE BATHROOM?   · Use night lights.  · Install grab bars by the toilet and in the tub and shower. Do not use towel bars as grab bars.  · Use non-skid mats or decals in the tub or shower.  · If you need to sit down in the shower, use a plastic, non-slip stool.  · Keep the floor dry. Clean up any water that spills on the floor as soon as it happens.  · Remove soap buildup in the tub or shower regularly.  · Attach bath mats securely with double-sided non-slip rug tape.  · Do not have throw rugs and other things on the floor that can make you trip.  WHAT CAN I DO IN THE BEDROOM?  · Use night lights.  · Make sure that you have a light by your bed that is easy to reach.  · Do not use any sheets or blankets that are too big for your bed. They should not hang down onto the floor.  · Have a firm chair that has side arms. You can use this for support while you get dressed.  · Do not have throw rugs and other things on the floor that can make you trip.  WHAT CAN I DO IN  THE KITCHEN?  · Clean up any spills right away.  · Avoid walking on wet floors.  · Keep items that you use a lot in easy-to-reach places.  · If you need to reach something above you, use a strong step stool that has a grab bar.  · Keep electrical cords out of the way.  · Do not use floor polish or wax that makes floors slippery. If you must use wax, use non-skid floor wax.  · Do not have throw rugs and other things on the floor that can make you trip.  WHAT CAN I DO WITH MY STAIRS?  · Do not leave any items on the stairs.  · Make sure that there are handrails on both sides of the stairs and use them. Fix handrails that are broken or loose. Make sure that handrails are as long as the stairways.  · Check any carpeting to make sure that it is firmly attached to the stairs. Fix any carpet that is loose or worn.  · Avoid having throw rugs at the top   or bottom of the stairs. If you do have throw rugs, attach them to the floor with carpet tape.  · Make sure that you have a light switch at the top of the stairs and the bottom of the stairs. If you do not have them, ask someone to add them for you.  WHAT ELSE CAN I DO TO HELP PREVENT FALLS?  · Wear shoes that:    Do not have high heels.    Have rubber bottoms.    Are comfortable and fit you well.    Are closed at the toe. Do not wear sandals.  · If you use a stepladder:    Make sure that it is fully opened. Do not climb a closed stepladder.    Make sure that both sides of the stepladder are locked into place.    Ask someone to hold it for you, if possible.  · Clearly mark and make sure that you can see:    Any grab bars or handrails.    First and last steps.    Where the edge of each step is.  · Use tools that help you move around (mobility aids) if they are needed. These include:    Canes.    Walkers.    Scooters.    Crutches.  · Turn on the lights when you go into a dark area. Replace any light bulbs as soon as they burn out.  · Set up your furniture so you have a clear  path. Avoid moving your furniture around.  · If any of your floors are uneven, fix them.  · If there are any pets around you, be aware of where they are.  · Review your medicines with your doctor. Some medicines can make you feel dizzy. This can increase your chance of falling.  Ask your doctor what other things that you can do to help prevent falls.     This information is not intended to replace advice given to you by your health care provider. Make sure you discuss any questions you have with your health care provider.     Document Released: 04/17/2009 Document Revised: 11/05/2014 Document Reviewed: 07/26/2014  [employer] Patient Education ©2016 [employer]

## 2015-10-22 NOTE — Discharge Summary (Signed)
Physician Discharge Summary  [name] [id number] DOB: Mar 11, 1951 DOA: 10/20/2015  PCP: Lilian Coma, MD  Admit date: 10/20/2015 Discharge date: 10/22/2015  Time spent: 30 minutes  Recommendations for Outpatient Follow-up:  1. Follow up with PCP in one week.  2. Follow up with neurology as needed.    Discharge Diagnoses:  Active Problems:   Choreiform movements   Malnutrition of moderate degree   Hyperkinetic disorder   Discharge Condition: improved.   Diet recommendation: regular diet.   Filed Weights   10/21/15 0231  Weight: 68.221 kg (150 lb 6.4 oz)    History of present illness:  [name] is a 65 y.o. male with recent admission for Pneumonia on home PT for deconditioning who began to have an episode of uncontrolled activity of his Left leg then his Left arm, and into his right leg. He was admitted for further eval.   Hospital Course:    Malnutrition of moderate degree: nutrition consulted .   Involuntary movements of the left extremity. Of unclear etiology  ? Myoclonus Getting MRI of the brain and c spine, which are negative for significant stenosis. Neurology consulted and recommendations given, outpatient follow up at neurology [facility] as needed.  CTA negative.    BPH: resume flomax  Hyperlipidemia: Resume zocor.  Procedures:  none  Consultations:  Neurology.  Discharge Exam: Filed Vitals:   10/22/15 1039 10/22/15 1338  BP: 109/57 93/53  Pulse: 80 83  Temp: 98.2 F (36.8 C) 98.4 F (36.9 C)  Resp: 17 17    General: alert comfortable.  Cardiovascular: s1s2 Respiratory: ctab  Discharge Instructions   Discharge Instructions    Diet - low sodium heart healthy    Complete by:  As directed      Discharge instructions    Complete by:  As directed   Follow up with PCP in 1 to 2 weeks.  Please follow up with neurology as needed.          Current Discharge Medication List    CONTINUE these medications which have NOT  CHANGED   Details  ALPRAZolam (XANAX) 0.5 MG tablet Take 1 tablet (0.5 mg total) by mouth at bedtime as needed for anxiety. Qty: 30 tablet, Refills: 0    buPROPion (WELLBUTRIN SR) 200 MG 12 hr tablet Take 200 mg by mouth 2 (two) times daily.    dextromethorphan (DELSYM) 30 MG/5ML liquid Take 10 mLs by mouth every 12 (twelve) hours as needed for cough.    famotidine (PEPCID) 20 MG tablet Take 1 tablet (20 mg total) by mouth at bedtime. Qty: 30 tablet, Refills: 3    FIBER SELECT GUMMIES CHEW Chew 2 each by mouth daily.    FLUoxetine (PROZAC) 40 MG capsule Take 40 mg by mouth daily.      fluticasone (FLONASE) 50 MCG/ACT nasal spray Place 2 sprays into both nostrils 2 (two) times daily. Qty: 16 g, Refills: 2    methylphenidate (RITALIN) 10 MG tablet Take 10 mg by mouth 3 (three) times daily.      Multiple Vitamin (MULTIVITAMIN WITH MINERALS) TABS tablet Take 1 tablet by mouth daily.    omeprazole (PRILOSEC) 20 MG capsule Take 20 mg by mouth 4 (four) times daily.     Probiotic Product (PROBIOTIC DAILY) CAPS Take 1 capsule by mouth daily. Reported on 08/21/2015    Respiratory Therapy Supplies (FLUTTER) DEVI Use as directed Qty: 1 each, Refills: 0    simvastatin (ZOCOR) 20 MG tablet Take 20 mg by mouth  at bedtime.        STOP taking these medications     acetaminophen-codeine (TYLENOL #3) 300-30 MG tablet        Allergies  Allergen Reactions  . Ciprofloxacin Hcl Other (See Comments)    Burning in ear when drops were used  . Asa [Aspirin]   . Gabapentin     Weakness and dizziness   Follow-up Information    Follow up with Lilian Coma, MD. Schedule an appointment as soon as possible for a visit in 1 week.   Specialty:  Family Medicine   Contact information:   [place] [address] [phone]        The results of significant diagnostics from this hospitalization (including imaging, microbiology, ancillary and laboratory) are  listed below for reference.    Significant Diagnostic Studies: Ct Angio Head W/cm &/or Wo Cm  10/21/2015  CLINICAL DATA:  Initial evaluation for left-sided shaking with standing. EXAM: CT ANGIOGRAPHY HEAD AND NECK TECHNIQUE: Multidetector CT imaging of the head and neck was performed using the standard protocol during bolus administration of intravenous contrast. Multiplanar CT image reconstructions and MIPs were obtained to evaluate the vascular anatomy. Carotid stenosis measurements (when applicable) are obtained utilizing NASCET criteria, using the distal internal carotid diameter as the denominator. CONTRAST:  300 cc of Isovue 370. COMPARISON:  Prior CT from 09/21/2010. FINDINGS: CT HEAD Cerebral volume within normal limits for age. No significant white matter disease appreciated. No acute large vessel territory infarct. No acute intracranial hemorrhage. No mass lesion, midline shift or mass effect. No hydrocephalus. No extra-axial fluid collection. Scalp soft tissues within normal limits. No acute abnormality about the orbits. Paranasal sinuses are clear. No mastoid effusion. Sequela of prior mastoidectomy seen on the left. No mastoid effusion. Calvarium intact. CTA NECK Aortic arch: Visualized aortic arch of normal caliber with normal 3 vessel morphology. No high-grade stenosis at the origin of the great vessels. Focal plaque within the proximal right subclavian artery without significant stenosis. Visualized subclavian arteries widely patent. Right carotid system: Right common carotid artery patent from its origin to the bifurcation. No significant atheromatous disease about the right bifurcation. Right ICA widely patent from the bifurcation to the skullbase. No stenosis, dissection, or vascular occlusion within the right carotid artery system. Right external carotid artery and its branches within normal limits. Left carotid system: Left common carotid artery patent from its origin to the bifurcation. No  significant atheromatous disease about the left bifurcation. Left ICA widely patent from the bifurcation to the skullbase. No stenosis, dissection, or vascular occlusion within the left carotid artery system. Left external carotid artery is branches within normal limits. Vertebral arteries:Both vertebral arteries arise from the subclavian arteries. Vertebral arteries widely patent without evidence for dissection, stenosis, or occlusion. Mild irregularity of the distal right vertebral artery favored to be artifactual nature. Skeleton: Subacute appearing fracture of the right lateral second rib present (series 501, image 26). No other acute osseous abnormality. Moderate spondylolysis at C5-6 and C6-7. Other neck: Patchy multi focal ground-glass opacity within the partially visualized upper lobes, nonspecific, but may reflect sequela of multifocal infection. Scattered nodular densities within the right lung apex measure up to 1 cm, nonspecific. Visualized superior mediastinum within normal limits. Postsurgical changes seen at the level of thyroid. Thyroid gland itself is grossly unremarkable. No adenopathy. No acute soft tissue abnormality within the neck. CTA HEAD Anterior circulation: The petrous segments are widely patent bilaterally. Mild scattered atheromatous plaque within  the cavernous ICAs without significant stenosis. Mild irregularity of the distal cavernous left ICA just beyond the anterior genu favored to relate atheromatous disease (series 504, image 144). Supraclinoid segments widely patent. A1 segments, anterior communicating artery, and anterior cerebral arteries well opacified. M1 segments patent bilaterally without stenosis or occlusion. MCA bifurcations normal. Distal MCA branches symmetric and well opacified bilaterally. Posterior circulation: Vertebral arteries patent to the vertebrobasilar junction. Posterior inferior cerebral arteries patent. Basilar artery widely patent. Superior cerebral  arteries well opacified bilaterally. Both posterior cerebral arteries arise in basilar artery are well opacified to their distal aspects. Venous sinuses: Patent without evidence for venous sinus thrombosis. Anatomic variants: No anatomic variant.  No aneurysm. Delayed phase: No abnormal enhancement on delayed sequence. IMPRESSION: CTA NECK IMPRESSION: 1. Normal CTA of the neck. 2. Patchy multifocal ground-glass and nodular opacities within the partially visualized lungs, nonspecific, but may reflect sequela of multifocal infection. 3. Subacute fracture of the lateral right second rib. Query recent trauma. CTA HEAD IMPRESSION: 1. Normal for age CTA of the head. No large or proximal arterial branch occlusion. No high-grade or correctable stenosis. 2. Mild atheromatous irregularity within the cavernous ICAs bilaterally without significant stenosis. Electronically Signed   By: Jeannine Boga M.D.   On: 10/21/2015 03:30   Dg Chest 2 View  10/03/2015  CLINICAL DATA:  Followup community acquired pneumonia. EXAM: CHEST  2 VIEW COMPARISON:  09/09/2015 and 08/26/2015 FINDINGS: There been interval resolution of bilateral upper lobe airspace disease since previous exam, consistent with resolving pneumonia. No evidence of acute infiltrate or pleural effusion. Heart size and mediastinal contours are within normal limits. IMPRESSION: Resolution of bilateral upper lobe infiltrates since prior exam. No acute findings. Electronically Signed   By: Earle Gell M.D.   On: 10/03/2015 08:13   Ct Angio Neck W/cm &/or Wo/cm  10/21/2015  CLINICAL DATA:  Initial evaluation for left-sided shaking with standing. EXAM: CT ANGIOGRAPHY HEAD AND NECK TECHNIQUE: Multidetector CT imaging of the head and neck was performed using the standard protocol during bolus administration of intravenous contrast. Multiplanar CT image reconstructions and MIPs were obtained to evaluate the vascular anatomy. Carotid stenosis measurements (when  applicable) are obtained utilizing NASCET criteria, using the distal internal carotid diameter as the denominator. CONTRAST:  300 cc of Isovue 370. COMPARISON:  Prior CT from 09/21/2010. FINDINGS: CT HEAD Cerebral volume within normal limits for age. No significant white matter disease appreciated. No acute large vessel territory infarct. No acute intracranial hemorrhage. No mass lesion, midline shift or mass effect. No hydrocephalus. No extra-axial fluid collection. Scalp soft tissues within normal limits. No acute abnormality about the orbits. Paranasal sinuses are clear. No mastoid effusion. Sequela of prior mastoidectomy seen on the left. No mastoid effusion. Calvarium intact. CTA NECK Aortic arch: Visualized aortic arch of normal caliber with normal 3 vessel morphology. No high-grade stenosis at the origin of the great vessels. Focal plaque within the proximal right subclavian artery without significant stenosis. Visualized subclavian arteries widely patent. Right carotid system: Right common carotid artery patent from its origin to the bifurcation. No significant atheromatous disease about the right bifurcation. Right ICA widely patent from the bifurcation to the skullbase. No stenosis, dissection, or vascular occlusion within the right carotid artery system. Right external carotid artery and its branches within normal limits. Left carotid system: Left common carotid artery patent from its origin to the bifurcation. No significant atheromatous disease about the left bifurcation. Left ICA widely patent from the bifurcation to the skullbase. No  stenosis, dissection, or vascular occlusion within the left carotid artery system. Left external carotid artery is branches within normal limits. Vertebral arteries:Both vertebral arteries arise from the subclavian arteries. Vertebral arteries widely patent without evidence for dissection, stenosis, or occlusion. Mild irregularity of the distal right vertebral artery  favored to be artifactual nature. Skeleton: Subacute appearing fracture of the right lateral second rib present (series 501, image 26). No other acute osseous abnormality. Moderate spondylolysis at C5-6 and C6-7. Other neck: Patchy multi focal ground-glass opacity within the partially visualized upper lobes, nonspecific, but may reflect sequela of multifocal infection. Scattered nodular densities within the right lung apex measure up to 1 cm, nonspecific. Visualized superior mediastinum within normal limits. Postsurgical changes seen at the level of thyroid. Thyroid gland itself is grossly unremarkable. No adenopathy. No acute soft tissue abnormality within the neck. CTA HEAD Anterior circulation: The petrous segments are widely patent bilaterally. Mild scattered atheromatous plaque within the cavernous ICAs without significant stenosis. Mild irregularity of the distal cavernous left ICA just beyond the anterior genu favored to relate atheromatous disease (series 504, image 144). Supraclinoid segments widely patent. A1 segments, anterior communicating artery, and anterior cerebral arteries well opacified. M1 segments patent bilaterally without stenosis or occlusion. MCA bifurcations normal. Distal MCA branches symmetric and well opacified bilaterally. Posterior circulation: Vertebral arteries patent to the vertebrobasilar junction. Posterior inferior cerebral arteries patent. Basilar artery widely patent. Superior cerebral arteries well opacified bilaterally. Both posterior cerebral arteries arise in basilar artery are well opacified to their distal aspects. Venous sinuses: Patent without evidence for venous sinus thrombosis. Anatomic variants: No anatomic variant.  No aneurysm. Delayed phase: No abnormal enhancement on delayed sequence. IMPRESSION: CTA NECK IMPRESSION: 1. Normal CTA of the neck. 2. Patchy multifocal ground-glass and nodular opacities within the partially visualized lungs, nonspecific, but may  reflect sequela of multifocal infection. 3. Subacute fracture of the lateral right second rib. Query recent trauma. CTA HEAD IMPRESSION: 1. Normal for age CTA of the head. No large or proximal arterial branch occlusion. No high-grade or correctable stenosis. 2. Mild atheromatous irregularity within the cavernous ICAs bilaterally without significant stenosis. Electronically Signed   By: Jeannine Boga M.D.   On: 10/21/2015 03:30   Mr Brain Wo Contrast  10/21/2015  CLINICAL DATA:  Intermittent confusion, LEFT-sided tremor when standing. Bilateral upper extremity weakness. History of pneumonia, hyperlipidemia, chronic respiratory failure. EXAM: MRI HEAD WITHOUT CONTRAST MRI CERVICAL SPINE WITHOUT AND WITH CONTRAST TECHNIQUE: Multiplanar, multiecho pulse sequences of the brain and surrounding structures, were obtained without intravenous contrast. Multiplanar, multi echo pulse sequences of the cervical spine, to include the craniocervical junction and cervicothoracic junction, were obtained without and with intravenous contrast. CONTRAST:  63mL MULTIHANCE GADOBENATE DIMEGLUMINE 529 MG/ML IV SOLN COMPARISON:  CTA head and neck Nov 20, 2015 at 0147 hours FINDINGS: MRI HEAD FINDINGS INTRACRANIAL CONTENTS: No reduced diffusion to suggest acute ischemia. No susceptibility artifact to suggest hemorrhage. The ventricles and sulci are normal for patient's age. Scattered subcentimeter supratentorial white matter FLAIR T2 hyperintensities. No suspicious parenchymal signal, mass lesions, mass effect. No abnormal extra-axial fluid collections. No extra-axial masses though, contrast enhanced sequences would be more sensitive. Normal major intracranial vascular flow voids present at skull base. ORBITS: The included ocular globes and orbital contents are non-suspicious. Status post bilateral ocular lens implants. SINUSES: Minimal paranasal sinus mucosal thickening. Trace LEFT mastoid effusion. SKULL/SOFT TISSUES: No abnormal  sellar expansion. No suspicious calvarial bone marrow signal. Craniocervical junction maintained. MRI CERVICAL SPINE FINDINGS Cervical vertebral  bodies intact and aligned with maintenance of cervical lordosis. Moderate to severe C6-7 disc height loss, moderate at C5-6. Moderate to severe subacute discogenic endplate changes 075-GRM and C6-7. Decreased T2 signal within the cervical disc compatible with mild desiccation. Mild C4-5 RIGHT facet bone marrow edema. No abnormal osseous or intradiscal enhancement. Cervical spinal cord is normal morphology and signal characteristics the level of T3-4, the most caudal well visualized level. No abnormal cord, leptomeningeal or epidural enhancement. Vertebral arteries are patent. Normal appearance of the included paraspinal and prevertebral soft tissues. Level by level evaluation: C2-3: Small broad-based disc bulge, uncovertebral hypertrophy and mild facet arthropathy. No canal stenosis. Mild bilateral neural foraminal narrowing. C3-4: Small broad-based disc bulge, uncovertebral hypertrophy. Mild facet arthropathy. No canal stenosis. Minimal neural foraminal narrowing. C4-5: Small broad-based disc bulge. Uncovertebral hypertrophy and moderate to severe facet arthropathy. Minimal canal stenosis and minimal neural foraminal narrowing. C5-6: 3 mm broad-based disc bulge. Uncovertebral hypertrophy. Severe RIGHT, mild LEFT facet arthropathy. Mild canal stenosis. Moderate LEFT neural foraminal narrowing. C6-7: 2 mm broad-based disc bulge. Uncovertebral hypertrophy and mild facet arthropathy. No canal stenosis. Mild to moderate LEFT neural foraminal narrowing. C7-T1: No disc bulge, canal stenosis nor neural foraminal narrowing. IMPRESSION: MRI BRAIN: No acute intracranial process. Mild white matter changes most commonly seen with chronic small vessel ischemic disease. MRI CERVICAL SPINE: No acute fracture or malalignment. No suspicious enhancement. Mild canal stenosis C5-6. Neural  foraminal narrowing C2-3 through C6-7: Moderate on the LEFT at C5-6. Electronically Signed   By: Elon Alas M.D.   On: 10/21/2015 22:08   Mr Cervical Spine W Wo Contrast  10/21/2015  CLINICAL DATA:  Intermittent confusion, LEFT-sided tremor when standing. Bilateral upper extremity weakness. History of pneumonia, hyperlipidemia, chronic respiratory failure. EXAM: MRI HEAD WITHOUT CONTRAST MRI CERVICAL SPINE WITHOUT AND WITH CONTRAST TECHNIQUE: Multiplanar, multiecho pulse sequences of the brain and surrounding structures, were obtained without intravenous contrast. Multiplanar, multi echo pulse sequences of the cervical spine, to include the craniocervical junction and cervicothoracic junction, were obtained without and with intravenous contrast. CONTRAST:  50mL MULTIHANCE GADOBENATE DIMEGLUMINE 529 MG/ML IV SOLN COMPARISON:  CTA head and neck Nov 20, 2015 at 0147 hours FINDINGS: MRI HEAD FINDINGS INTRACRANIAL CONTENTS: No reduced diffusion to suggest acute ischemia. No susceptibility artifact to suggest hemorrhage. The ventricles and sulci are normal for patient's age. Scattered subcentimeter supratentorial white matter FLAIR T2 hyperintensities. No suspicious parenchymal signal, mass lesions, mass effect. No abnormal extra-axial fluid collections. No extra-axial masses though, contrast enhanced sequences would be more sensitive. Normal major intracranial vascular flow voids present at skull base. ORBITS: The included ocular globes and orbital contents are non-suspicious. Status post bilateral ocular lens implants. SINUSES: Minimal paranasal sinus mucosal thickening. Trace LEFT mastoid effusion. SKULL/SOFT TISSUES: No abnormal sellar expansion. No suspicious calvarial bone marrow signal. Craniocervical junction maintained. MRI CERVICAL SPINE FINDINGS Cervical vertebral bodies intact and aligned with maintenance of cervical lordosis. Moderate to severe C6-7 disc height loss, moderate at C5-6. Moderate to  severe subacute discogenic endplate changes 075-GRM and C6-7. Decreased T2 signal within the cervical disc compatible with mild desiccation. Mild C4-5 RIGHT facet bone marrow edema. No abnormal osseous or intradiscal enhancement. Cervical spinal cord is normal morphology and signal characteristics the level of T3-4, the most caudal well visualized level. No abnormal cord, leptomeningeal or epidural enhancement. Vertebral arteries are patent. Normal appearance of the included paraspinal and prevertebral soft tissues. Level by level evaluation: C2-3: Small broad-based disc bulge, uncovertebral hypertrophy and mild facet  arthropathy. No canal stenosis. Mild bilateral neural foraminal narrowing. C3-4: Small broad-based disc bulge, uncovertebral hypertrophy. Mild facet arthropathy. No canal stenosis. Minimal neural foraminal narrowing. C4-5: Small broad-based disc bulge. Uncovertebral hypertrophy and moderate to severe facet arthropathy. Minimal canal stenosis and minimal neural foraminal narrowing. C5-6: 3 mm broad-based disc bulge. Uncovertebral hypertrophy. Severe RIGHT, mild LEFT facet arthropathy. Mild canal stenosis. Moderate LEFT neural foraminal narrowing. C6-7: 2 mm broad-based disc bulge. Uncovertebral hypertrophy and mild facet arthropathy. No canal stenosis. Mild to moderate LEFT neural foraminal narrowing. C7-T1: No disc bulge, canal stenosis nor neural foraminal narrowing. IMPRESSION: MRI BRAIN: No acute intracranial process. Mild white matter changes most commonly seen with chronic small vessel ischemic disease. MRI CERVICAL SPINE: No acute fracture or malalignment. No suspicious enhancement. Mild canal stenosis C5-6. Neural foraminal narrowing C2-3 through C6-7: Moderate on the LEFT at C5-6. Electronically Signed   By: Elon Alas M.D.   On: 10/21/2015 22:08    Microbiology: No results found for this or any previous visit (from the past 240 hour(s)).   Labs: Basic Metabolic Panel:  Recent  Labs Lab 10/20/15 1611  NA 138  K 4.1  CL 101  CO2 27  GLUCOSE 104*  BUN 14  CREATININE 0.96  CALCIUM 9.2   Liver Function Tests:  Recent Labs Lab 10/20/15 1611  AST 25  ALT 29  ALKPHOS 98  BILITOT 0.6  PROT 7.1  ALBUMIN 3.7   No results for input(s): LIPASE, AMYLASE in the last 168 hours. No results for input(s): AMMONIA in the last 168 hours. CBC:  Recent Labs Lab 10/20/15 1611  WBC 11.2*  NEUTROABS 7.1  HGB 14.5  HCT 44.3  MCV 90.8  PLT 283   Cardiac Enzymes:  Recent Labs Lab 10/20/15 2050  CKTOTAL 50   BNP: BNP (last 3 results)  Recent Labs  09/04/15 1012  BNP 113.3*    ProBNP (last 3 results) No results for input(s): PROBNP in the last 8760 hours.  CBG: No results for input(s): GLUCAP in the last 168 hours.     SignedHosie Poisson MD.  [facility] 10/22/2015, 5:04 PM

## 2015-10-22 NOTE — Progress Notes (Signed)
STROKE TEAM PROGRESS NOTE   HISTORY OF PRESENT ILLNESS [name] is a 65 y.o. male with complex medical hx including a recent PNA that forced the pt to be in the hospital for 9 days. He was discharged 1 month ago with home PT. He has been making some progress but since Saturday he has regressed in several ways. He has felt somewhat confused intermittently, his speech is faulty and when he stands his left side shakes. Started with the leg but earlier had a similar spell in the arm. This only happens when he is standing. The shaking can be very violent. It goes away when he changes position but on exam can elicit by forcing the patient to exert with the left leg. He has had the cough for the last month and this has not really improved much although he no linger has PNA by imaging. On Saturday he also felt confused at a movie theater, when he thoguht momentaily that he was part of the movie. Today the shaking of the left leg seemed to be worse and this is the reason he came to the ED. Denies hx of stroke. 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 Does not take antiplatelets at basline. Gives a hsitory of orthostatic hypotenison with drops over 30 points between sitting and standing. Please note that also provides hx of MS dx sometime in his life.  Patient was not administered IV tPA. He was admitted for further evaluation and treatment.   SUBJECTIVE (INTERVAL HISTORY) His wife is at the bedside.  Overall he feels his condition is stable. He has had no more episodes since yesterday  INR/Prothrombin Time scan shows no acute infarct. MRI scan of the neck shows mild degenerative changes and foraminal stenosis on the left  OBJECTIVE Temp:  [98 F (36.7 C)-98.9 F (37.2 C)] 98.4 F (36.9 C) (04/19 1338) Pulse Rate:  [76-88] 83 (04/19 1338) Cardiac Rhythm:  [-] Normal sinus rhythm (04/19 0700) Resp:  [16-20] 17 (04/19 1338) BP: (93-118)/(53-69) 93/53 mmHg (04/19  1338) SpO2:  [97 %-99 %] 98 % (04/19 1338)  CBC:   Recent Labs Lab 10/20/15 1611  WBC 11.2*  NEUTROABS 7.1  HGB 14.5  HCT 44.3  MCV 90.8  PLT [id number]    Basic Metabolic Panel:   Recent Labs Lab 10/20/15 1611  NA 138  K 4.1  CL 101  CO2 27  GLUCOSE 104*  BUN 14  CREATININE 0.96  CALCIUM 9.2    Lipid Panel:     Component Value Date/Time   CHOL 189 10/21/2015 1054   TRIG 110 10/21/2015 1054   HDL 43 10/21/2015 1054   CHOLHDL 4.4 10/21/2015 1054   VLDL 22 10/21/2015 1054   LDLCALC 124* 10/21/2015 1054   HgbA1c:  Lab Results  Component Value Date   HGBA1C 5.9* 10/21/2015   Urine Drug Screen: No results found for: LABOPIA, COCAINSCRNUR, LABBENZ, AMPHETMU, THCU, LABBARB    IMAGING  CTA NECK  10/21/2015   1. Normal CTA of the neck. 2. Patchy multifocal ground-glass and nodular opacities within the partially visualized lungs, nonspecific, but may reflect sequela of multifocal infection. 3. Subacute fracture of the lateral right second rib. Query recent trauma.   CTA HEAD  10/21/2015   1. Normal for age CTA of the head. No large or proximal arterial branch occlusion. No high-grade or correctable stenosis. 2. Mild atheromatous irregularity within the cavernous ICAs bilaterally without significant stenosis.    MRI brain  No acute intracranial process.Mild white matter changes most commonly seen with chronic small vessel ischemic disease.  MRI C Spine   Mild canal stenosis C5-6. Neural foraminal narrowing C2-3 through C6-7: Moderate on the LEFT at C5-6.    PHYSICAL EXAM Frail middle-aged Caucasian male not in distress. . Afebrile. Head is nontraumatic. Neck is supple without bruit.    Cardiac exam no murmur or gallop. Lungs are clear to auscultation. Distal pulses are well felt. Neurological Exam :   Awake  Alert oriented x 3. Normal speech and language.eye movements full without nystagmus.fundi were not visualized. Vision acuity and fields appear normal. Hearing is  normal. Palatal movements are normal. Face symmetric. Tongue midline. Normal strength, tone, reflexes and coordination . Except mild giveaway weakness of the left hip flexors only. Ankle jerks are both depressed compared to knee jerks.. Normal sensation. Gait deferred. ASSESSMENT/PLAN Mr. ESIAS MCTIER is a 65 y.o. male with history of recent admission for PNA and flu presenting with confusion and L leg shaking when he stands. He did not receive IV t-PA.   Odd presentation of confusion and L body shaking. Doubt TIA . Involuntary leg movements ? etiology  MRI brain no acute infarct  MRI C Spine  Mild Canal stenosis at C5-6  And moderate foraminal narrowing on the left at C5-6  CTA head normal for age  CTA neck normal CTA. Lung w/ ground glass and nodular opacities. Subacute fx R 2nd rib. 2D Echo  Left ventricle: The cavity size was normal. Systolic function was  normal. The estimated ejection fraction was in the range of 55%  to 60%. Wall motion was normal; there were no regional wall   motion abnormalities.  LDL 124  HgbA1c 5.9  Lovenox 30 mg sq daily for VTE prophylaxis Diet regular Room service appropriate?: Yes; Fluid consistency:: Thin  No antithrombotic prior to admission, now on No antithrombotic. Allergic to aspirin. No clear stroke at this time.  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 I have made any additions or clarifications directly to the above note. Agree with note above. He presented with transient episode of left leg and hand shaking, trembling and involuntary movements with lack of coordination   related to performing some activities. It is unclear that this represents TIA or action myoclonus or postural tremors as etiology.  The brain and cervical spine imaging does not reveal significant pathology to explain his symptoms. Patient may ger discharged  and may be referred back to neurology as an outpatient if these involuntary movements recur. Perhaps this was a one-time episode related to deconditioning from his recent hospitalization for pneumonia. Stroke team will sign off. Kindly call for questions. I had a long discussion with the patient and wife at the bedside and answered questions. They voiced understanding.  Antony Contras, MD Medical Director [facility]  [facility] Pager: [phone] 10/22/2015 3:07 PM    To contact Stroke Continuity provider, please refer to [email or web address] After hours, contact General Neurology

## 2015-10-23 DIAGNOSIS — E785 Hyperlipidemia, unspecified: Secondary | ICD-10-CM | POA: Diagnosis not present

## 2015-10-23 DIAGNOSIS — Z87891 Personal history of nicotine dependence: Secondary | ICD-10-CM | POA: Diagnosis not present

## 2015-10-23 DIAGNOSIS — I779 Disorder of arteries and arterioles, unspecified: Secondary | ICD-10-CM | POA: Diagnosis not present

## 2015-10-23 DIAGNOSIS — S2231XA Fracture of one rib, right side, initial encounter for closed fracture: Secondary | ICD-10-CM | POA: Diagnosis not present

## 2015-10-23 DIAGNOSIS — K2270 Barrett's esophagus without dysplasia: Secondary | ICD-10-CM | POA: Diagnosis not present

## 2015-10-23 DIAGNOSIS — K219 Gastro-esophageal reflux disease without esophagitis: Secondary | ICD-10-CM | POA: Diagnosis not present

## 2015-10-23 DIAGNOSIS — J09X1 Influenza due to identified novel influenza A virus with pneumonia: Secondary | ICD-10-CM | POA: Diagnosis not present

## 2015-10-23 DIAGNOSIS — R258 Other abnormal involuntary movements: Secondary | ICD-10-CM | POA: Diagnosis not present

## 2015-10-23 DIAGNOSIS — I679 Cerebrovascular disease, unspecified: Secondary | ICD-10-CM | POA: Diagnosis not present

## 2015-10-23 DIAGNOSIS — E46 Unspecified protein-calorie malnutrition: Secondary | ICD-10-CM | POA: Diagnosis not present

## 2015-10-23 DIAGNOSIS — M9981 Other biomechanical lesions of cervical region: Secondary | ICD-10-CM | POA: Diagnosis not present

## 2015-10-23 DIAGNOSIS — R7303 Prediabetes: Secondary | ICD-10-CM | POA: Diagnosis not present

## 2015-10-23 DIAGNOSIS — M4802 Spinal stenosis, cervical region: Secondary | ICD-10-CM | POA: Diagnosis not present

## 2015-10-23 DIAGNOSIS — J188 Other pneumonia, unspecified organism: Secondary | ICD-10-CM | POA: Diagnosis not present

## 2015-10-24 DIAGNOSIS — J188 Other pneumonia, unspecified organism: Secondary | ICD-10-CM | POA: Diagnosis not present

## 2015-10-24 DIAGNOSIS — K219 Gastro-esophageal reflux disease without esophagitis: Secondary | ICD-10-CM | POA: Diagnosis not present

## 2015-10-24 DIAGNOSIS — Z87891 Personal history of nicotine dependence: Secondary | ICD-10-CM | POA: Diagnosis not present

## 2015-10-24 DIAGNOSIS — J09X1 Influenza due to identified novel influenza A virus with pneumonia: Secondary | ICD-10-CM | POA: Diagnosis not present

## 2015-10-24 DIAGNOSIS — K2270 Barrett's esophagus without dysplasia: Secondary | ICD-10-CM | POA: Diagnosis not present

## 2015-10-24 DIAGNOSIS — E785 Hyperlipidemia, unspecified: Secondary | ICD-10-CM | POA: Diagnosis not present

## 2015-10-24 NOTE — Progress Notes (Signed)
Addendum to PT note to include G-codes.  Collie Siad PT, DPT  Pager: [phone] Phone: [phone]    08-Nov-2015 1130  PT G-Codes **NOT FOR INPATIENT CLASS**  Functional Assessment Tool Used Clinical Judgement  Functional Limitation Mobility: Walking and moving around  Mobility: Walking and Moving Around Current Status [phone]) CJ  Mobility: Walking and Moving Around Goal Status [phone]) CI

## 2015-10-27 ENCOUNTER — Encounter: Payer: Self-pay | Admitting: Neurology

## 2015-10-27 ENCOUNTER — Ambulatory Visit: Payer: Medicare Other | Admitting: Neurology

## 2015-10-27 ENCOUNTER — Ambulatory Visit (INDEPENDENT_AMBULATORY_CARE_PROVIDER_SITE_OTHER): Payer: Medicare Other | Admitting: Neurology

## 2015-10-27 ENCOUNTER — Other Ambulatory Visit (INDEPENDENT_AMBULATORY_CARE_PROVIDER_SITE_OTHER): Payer: Medicare Other

## 2015-10-27 VITALS — BP 110/72 | HR 107 | Ht 72.0 in | Wt 149.0 lb

## 2015-10-27 DIAGNOSIS — M6289 Other specified disorders of muscle: Secondary | ICD-10-CM | POA: Diagnosis not present

## 2015-10-27 DIAGNOSIS — E785 Hyperlipidemia, unspecified: Secondary | ICD-10-CM | POA: Diagnosis not present

## 2015-10-27 DIAGNOSIS — G259 Extrapyramidal and movement disorder, unspecified: Secondary | ICD-10-CM | POA: Diagnosis not present

## 2015-10-27 DIAGNOSIS — J188 Other pneumonia, unspecified organism: Secondary | ICD-10-CM | POA: Diagnosis not present

## 2015-10-27 DIAGNOSIS — Z87891 Personal history of nicotine dependence: Secondary | ICD-10-CM | POA: Diagnosis not present

## 2015-10-27 DIAGNOSIS — K2270 Barrett's esophagus without dysplasia: Secondary | ICD-10-CM | POA: Diagnosis not present

## 2015-10-27 DIAGNOSIS — R413 Other amnesia: Secondary | ICD-10-CM

## 2015-10-27 DIAGNOSIS — R531 Weakness: Secondary | ICD-10-CM

## 2015-10-27 DIAGNOSIS — K219 Gastro-esophageal reflux disease without esophagitis: Secondary | ICD-10-CM | POA: Diagnosis not present

## 2015-10-27 DIAGNOSIS — J09X1 Influenza due to identified novel influenza A virus with pneumonia: Secondary | ICD-10-CM | POA: Diagnosis not present

## 2015-10-27 LAB — TSH: TSH: 4.71 u[IU]/mL — AB (ref 0.35–4.50)

## 2015-10-27 LAB — T3, FREE: T3, Free: 2.8 pg/mL (ref 2.3–4.2)

## 2015-10-27 LAB — VITAMIN B12: VITAMIN B 12: 588 pg/mL (ref 211–911)

## 2015-10-27 LAB — T4, FREE: FREE T4: 0.75 ng/dL (ref 0.60–1.60)

## 2015-10-27 NOTE — Progress Notes (Signed)
Chart forwarded.  

## 2015-10-27 NOTE — Patient Instructions (Signed)
1.  For memory, we will check B12, TSH, free T3 and T4.  If unremarkable, we will refer you for neuropsychological testing. 2.  Due to the leg weakness, we will check MRI of thoracic and lumbar spine without contrast 3.  Follow up

## 2015-10-27 NOTE — Progress Notes (Signed)
NEUROLOGY CONSULTATION NOTE  [name] MRN: [id number] DOB: 1951/02/27  Referring provider: Dr. Stephanie Acre Primary care provider: Dr. Stephanie Acre  Reason for consult:  Leg weakness and myoclonus  HISTORY OF PRESENT ILLNESS: [name] is a 65 year old right-handed male with hyperlipidemia, depression, primary hyperparathyroidism s/p MI parathyroidectomy, orthostatic hypotension,  and MVP and history of left hip fracture with left hip replacement and orthostatic hypotension who presents for left sided movement disorder and leg weakness.  History obtained by patient, his wife and hospital notes.  Labs and imaging of brain CTA/MRI and CTA of neck reviewed.  He was admitted and treated in the hospital in March for acute respiratory due to acute on chronic COPD exacerbation and complicated by community-acquired pneumonia.  He was treated with Bactrim, Levaquin, Rocephin, azithromycin, Tamiflu and then Keflex.  He was discharged with home PT.  He had been doing well until last week, when he started to regress.  He would exhibit intermittent confusion.  One evening, he was at the movie theater and suddenly felt that he was part of the movie.  He also notes word-finding difficulty and trouble with spelling simple words.  He repeats questions often.  He has not driven since hospitalization, but he was driving without difficulty prior to hospitalization.  When he would stand up, his left side would shake, first only his leg but then it progressed to his arm.  It only occurs when he stands, not when sitting, laying down or walking.  He underwent a thorough neurologic workup.  CBC and CMP were unremarkable for infection or metabolic abnormalities.  MRI of the brain showed mild chronic small vessel ischemic changes but no acute intracranial abnormality.  MRI of cervical spine showed neural foraminal stenosis from C2-3 through C6-7 (moderate on left at C5-6), and mild canal stenosis at C5-6 but no significant  canal stenosis.  He underwent a TIA workup as well.  CTA of head and neck showed no significant stenosis. TTE was unremarkable, which showed EF 55-60%.  LDL was 124.  Hgb A1c was 5.9.  Etiology was unclear.  Symptoms have improved but not completely resolved.  The shaking doesn't always occur now when he stands up.  He notes a strange sensation in his left buttock and his leg spasms, but he denies pain.  He feels weaker on the left side, including arm and leg.  He has history of 3 left hip replacements.  He denies bowel or bladder dysfunction.  He reports he always now feels "spacy".  Labs from March include Sed Rate 70, and RF of 36.8.  HIV was negative, anti-DNA antibody negative, anti-scleroderma antibody negative, and negative CCP antibodies negative.  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  He used to work for the [employer] and [employer].  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 He has ADHD and notes panic attacks.  He denies family history of dementia.    He also mentions unintentional weight loss over 8 year period.  PAST MEDICAL HISTORY: Past Medical History  Diagnosis Date  . Pneumonia 2009  . 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 Mitral valve prolapse     does not see cardiologist for. last stress test 2003  . Hypercalcemia   . Arthritis   . GERD (gastroesophageal reflux disease)   . Lipoma     left forearm (3)  . Dysrhythmia     ":  Due to MVP"  . Bronchitis     history of  . Tumor cells, benign     lung, left side  . Hiatal hernia     PAST SURGICAL HISTORY: Past Surgical History  Procedure Laterality Date  . Cholecystectomy  2002  . Mastoidectomy  1996  . Appendectomy  1984  . Cataract extraction      L and R eye  . Parathyroidectomy    . Nasal septum surgery      sinus surgery  . Total hip revision  06/14/2011    Procedure: TOTAL HIP REVISION;  Surgeon: Kerin Salen;  Location: [place];  Service:  Orthopedics;  Laterality: Left;  Left Acetabular  Hip Revision  . Joint replacement      Lt hip    MEDICATIONS: Current Outpatient Prescriptions on File Prior to Visit  Medication Sig Dispense Refill  . ALPRAZolam (XANAX) 0.5 MG tablet Take 1 tablet (0.5 mg total) by mouth at bedtime as needed for anxiety. 30 tablet 0  . buPROPion (WELLBUTRIN SR) 200 MG 12 hr tablet Take 200 mg by mouth 2 (two) times daily.    Marland Kitchen dextromethorphan (DELSYM) 30 MG/5ML liquid Take 10 mLs by mouth every 12 (twelve) hours as needed for cough.    . famotidine (PEPCID) 20 MG tablet Take 1 tablet (20 mg total) by mouth at bedtime. 30 tablet 3  . FIBER SELECT GUMMIES CHEW Chew 2 each by mouth daily.    Marland Kitchen FLUoxetine (PROZAC) 40 MG capsule Take 40 mg by mouth daily.      . fluticasone (FLONASE) 50 MCG/ACT nasal spray Place 2 sprays into both nostrils 2 (two) times daily. 16 g 2  . methylphenidate (RITALIN) 10 MG tablet Take 10 mg by mouth 3 (three) times daily.      . Multiple Vitamin (MULTIVITAMIN WITH MINERALS) TABS tablet Take 1 tablet by mouth daily.    Marland Kitchen omeprazole (PRILOSEC) 20 MG capsule Take 20 mg by mouth 4 (four) times daily.     . Probiotic Product (PROBIOTIC DAILY) CAPS Take 1 capsule by mouth daily. Reported on 08/21/2015    . Respiratory Therapy Supplies (FLUTTER) DEVI Use as directed 1 each 0  . simvastatin (ZOCOR) 20 MG tablet Take 20 mg by mouth at bedtime.       No current facility-administered medications on file prior to visit.    ALLERGIES: Allergies  Allergen Reactions  . Ciprofloxacin Hcl Other (See Comments)    Burning in ear when drops were used  . Asa [Aspirin]   . Gabapentin     Weakness and dizziness    FAMILY HISTORY: Family History  Problem Relation Age of Onset  . Lung cancer Father 101    smoked  . Cancer Brother     prostate  . Heart Problems Mother   . Heart Problems Father     SOCIAL HISTORY: Social History   Social History  . Marital Status: Married    Spouse  Name: Arville Go  . Number of Children: 3  . Years of Education: masters   Occupational History  .      Sales   Social History Main Topics  . Smoking status: Former Smoker -- 1.50 packs/day for 24 years    Types: Cigarettes    Quit date: 07/06/1987  . Smokeless tobacco: Former Systems developer  . Alcohol Use: 0.6 oz/week    1 Cans of beer per week  . Drug Use: Yes    Special: Marijuana  Comment: college age  . Sexual Activity: Not on file   Other Topics Concern  . Not on file   Social History Narrative   Patient lives at home with his wife Arville Go) 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   Right handed.   Caffeine - one cup daily.    REVIEW OF SYSTEMS: Constitutional: No fevers, chills, or sweats, no generalized fatigue, change in appetite Eyes: No visual changes, double vision, eye pain Ear, nose and throat: No hearing loss, ear pain, nasal congestion, sore throat Cardiovascular: No chest pain, palpitations Respiratory:  No shortness of breath at rest or with exertion, wheezes GastrointestinaI: No nausea, vomiting, diarrhea, abdominal pain, fecal incontinence Genitourinary:  No dysuria, urinary retention or frequency Musculoskeletal:  No neck pain, back pain Integumentary: No rash, pruritus, skin lesions Neurological: as above Psychiatric: No depression, insomnia, anxiety Endocrine: No palpitations, fatigue, diaphoresis, mood swings, change in appetite, change in weight, increased thirst Hematologic/Lymphatic:  No anemia, purpura, petechiae. Allergic/Immunologic: no itchy/runny eyes, nasal congestion, recent allergic reactions, rashes  PHYSICAL EXAM: Filed Vitals:   10/27/15 1033  BP: 110/72  Pulse: 107   General: No acute distress.  Patient appears well-groomed.  Head:  Normocephalic/atraumatic Eyes:  fundi examined but not visualized Neck: supple, no paraspinal tenderness, full range of motion Back: No paraspinal tenderness Heart: regular rate and rhythm Lungs:  Clear to auscultation bilaterally. Vascular: No carotid bruits. Neurological Exam: Mental status: alert and oriented to person, place, and time, recent and remote memory intact, fund of knowledge intact, attention and concentration intact, speech fluent and not dysarthric, language intact.  Incorrectly completed Trail Making Test and clock but copied cube correctly. Montreal Cognitive Assessment  10/27/2015  Visuospatial/ Executive (0/5) 3  Naming (0/3) 3  Attention: Read list of digits (0/2) 2  Attention: Read list of letters (0/1) 1  Attention: Serial 7 subtraction starting at 100 (0/3) 1  Language: Repeat phrase (0/2) 2  Language : Fluency (0/1) 1  Abstraction (0/2) 2  Delayed Recall (0/5) 3  Orientation (0/6) 4  Total 22  Adjusted Score (based on education) 22   Cranial nerves: CN I: not tested CN II: pupils equal, round and reactive to light, visual fields intact CN III, IV, VI:  full range of motion, no nystagmus, no ptosis CN V: facial sensation intact CN VII: upper and lower face symmetric CN VIII: hearing intact CN IX, X: gag intact, uvula midline CN XI: sternocleidomastoid and trapezius muscles intact CN XII: tongue midline Bulk & Tone: atrophy of limbs, no fasciculations. Motor:  Giveway weakness on the left upper and lower extremities. Sensation:  Pinprick and vibration sensation intact. Deep Tendon Reflexes:  2+ throughout, toes downgoing.  Finger to nose testing:  With mild dysmetria on left Heel to shin:  Without dysmetria.  Gait:  Mildly spastic gait.  Able to turn but unable to tandem walk. Romberg negative.  IMPRESSION: 1.  Abnormal movements of left leg (occured once in arm and right leg).  It has been improving and he was unable to elicit it for me today.  It does not sound like a primary movement disorder.  It is not consistent with postural tremor as it is unilateral.  Consider cord lesion, however he does not exhibit upper motor neuron signs. 2.  Left upper  and lower extremity weakness.  He only demonstrates giveway weakness, so I cannot fully appreciate muscle strength.   3.  Memory deficits.  Unclear etiology.    PLAN: 1.  We will check MRI of thoracic and lumbar spine to look for any spinal cord involvement that would explain left leg symptoms. 2.  I would like to refer for neuropsychological testing. 3.  Will also check B12 ant thyroid panel 4.  He mentions weight loss over an 8 year period.  That is a long time for possible underlying malignancy but I defer to Dr. Stephanie Acre regarding any necessary workup. 5.  Follow up after testing  Thank you for allowing me to take part in the care of this patient.  Metta Clines, DO  CC:  Jonathon Jordan, MD

## 2015-10-28 ENCOUNTER — Telehealth: Payer: Self-pay

## 2015-10-28 NOTE — Telephone Encounter (Signed)
Sent via my chart message.

## 2015-10-28 NOTE — Telephone Encounter (Signed)
-----   Message from Pieter Partridge, DO sent at 10/28/2015 12:23 PM EDT ----- b12  Is normal.  TSH is mildly elevated but the other thyroid hormones are normal, so it may not be clinically significant.  However, if there is any questions, they should discuss with Dr. Stephanie Acre.

## 2015-10-29 ENCOUNTER — Telehealth: Payer: Self-pay | Admitting: Neurology

## 2015-10-29 DIAGNOSIS — K2270 Barrett's esophagus without dysplasia: Secondary | ICD-10-CM | POA: Diagnosis not present

## 2015-10-29 DIAGNOSIS — E785 Hyperlipidemia, unspecified: Secondary | ICD-10-CM | POA: Diagnosis not present

## 2015-10-29 DIAGNOSIS — J09X1 Influenza due to identified novel influenza A virus with pneumonia: Secondary | ICD-10-CM | POA: Diagnosis not present

## 2015-10-29 DIAGNOSIS — J188 Other pneumonia, unspecified organism: Secondary | ICD-10-CM | POA: Diagnosis not present

## 2015-10-29 DIAGNOSIS — K219 Gastro-esophageal reflux disease without esophagitis: Secondary | ICD-10-CM | POA: Diagnosis not present

## 2015-10-29 DIAGNOSIS — Z87891 Personal history of nicotine dependence: Secondary | ICD-10-CM | POA: Diagnosis not present

## 2015-10-29 NOTE — Telephone Encounter (Signed)
PT called in regards to lab results/Dawn CB# [phone]

## 2015-10-30 NOTE — Telephone Encounter (Signed)
Pt would like to know if elevated TSH could possibly have anything to do with spastic legs and difficulty walking. Please advise.

## 2015-10-30 NOTE — Telephone Encounter (Signed)
I don't think so

## 2015-10-31 DIAGNOSIS — K219 Gastro-esophageal reflux disease without esophagitis: Secondary | ICD-10-CM | POA: Diagnosis not present

## 2015-10-31 DIAGNOSIS — J09X1 Influenza due to identified novel influenza A virus with pneumonia: Secondary | ICD-10-CM | POA: Diagnosis not present

## 2015-10-31 DIAGNOSIS — E785 Hyperlipidemia, unspecified: Secondary | ICD-10-CM | POA: Diagnosis not present

## 2015-10-31 DIAGNOSIS — J188 Other pneumonia, unspecified organism: Secondary | ICD-10-CM | POA: Diagnosis not present

## 2015-10-31 DIAGNOSIS — Z87891 Personal history of nicotine dependence: Secondary | ICD-10-CM | POA: Diagnosis not present

## 2015-10-31 DIAGNOSIS — K2270 Barrett's esophagus without dysplasia: Secondary | ICD-10-CM | POA: Diagnosis not present

## 2015-11-03 ENCOUNTER — Telehealth: Payer: Self-pay

## 2015-11-03 ENCOUNTER — Ambulatory Visit
Admission: RE | Admit: 2015-11-03 | Discharge: 2015-11-03 | Disposition: A | Discharge status: Home / Self Care | Payer: Medicare Other | Source: Ambulatory Visit | Attending: Neurology | Admitting: Neurology

## 2015-11-03 DIAGNOSIS — G259 Extrapyramidal and movement disorder, unspecified: Secondary | ICD-10-CM

## 2015-11-03 DIAGNOSIS — K2270 Barrett's esophagus without dysplasia: Secondary | ICD-10-CM | POA: Diagnosis not present

## 2015-11-03 DIAGNOSIS — Z87891 Personal history of nicotine dependence: Secondary | ICD-10-CM | POA: Diagnosis not present

## 2015-11-03 DIAGNOSIS — M5126 Other intervertebral disc displacement, lumbar region: Secondary | ICD-10-CM | POA: Diagnosis not present

## 2015-11-03 DIAGNOSIS — J09X1 Influenza due to identified novel influenza A virus with pneumonia: Secondary | ICD-10-CM | POA: Diagnosis not present

## 2015-11-03 DIAGNOSIS — R413 Other amnesia: Secondary | ICD-10-CM

## 2015-11-03 DIAGNOSIS — E785 Hyperlipidemia, unspecified: Secondary | ICD-10-CM | POA: Diagnosis not present

## 2015-11-03 DIAGNOSIS — R531 Weakness: Secondary | ICD-10-CM

## 2015-11-03 DIAGNOSIS — K219 Gastro-esophageal reflux disease without esophagitis: Secondary | ICD-10-CM | POA: Diagnosis not present

## 2015-11-03 DIAGNOSIS — J188 Other pneumonia, unspecified organism: Secondary | ICD-10-CM | POA: Diagnosis not present

## 2015-11-03 DIAGNOSIS — M5124 Other intervertebral disc displacement, thoracic region: Secondary | ICD-10-CM | POA: Diagnosis not present

## 2015-11-03 NOTE — Telephone Encounter (Signed)
-----   Message from Pieter Partridge, DO sent at 11/03/2015 12:56 PM EDT ----- MRIs don't reveal any cause for the leg tremor or difficulty moving the leg.  At this point, I have no explanation for that.

## 2015-11-03 NOTE — Telephone Encounter (Signed)
Message relayed to patient. Verbalized understanding and denied questions.   

## 2015-11-04 ENCOUNTER — Ambulatory Visit: Payer: Medicare Other | Admitting: Neurology

## 2015-11-04 DIAGNOSIS — Z87891 Personal history of nicotine dependence: Secondary | ICD-10-CM | POA: Diagnosis not present

## 2015-11-04 DIAGNOSIS — K219 Gastro-esophageal reflux disease without esophagitis: Secondary | ICD-10-CM | POA: Diagnosis not present

## 2015-11-04 DIAGNOSIS — J188 Other pneumonia, unspecified organism: Secondary | ICD-10-CM | POA: Diagnosis not present

## 2015-11-04 DIAGNOSIS — J09X1 Influenza due to identified novel influenza A virus with pneumonia: Secondary | ICD-10-CM | POA: Diagnosis not present

## 2015-11-04 DIAGNOSIS — K2270 Barrett's esophagus without dysplasia: Secondary | ICD-10-CM | POA: Diagnosis not present

## 2015-11-04 DIAGNOSIS — E785 Hyperlipidemia, unspecified: Secondary | ICD-10-CM | POA: Diagnosis not present

## 2015-11-05 DIAGNOSIS — J188 Other pneumonia, unspecified organism: Secondary | ICD-10-CM | POA: Diagnosis not present

## 2015-11-05 DIAGNOSIS — Z87891 Personal history of nicotine dependence: Secondary | ICD-10-CM | POA: Diagnosis not present

## 2015-11-05 DIAGNOSIS — K219 Gastro-esophageal reflux disease without esophagitis: Secondary | ICD-10-CM | POA: Diagnosis not present

## 2015-11-05 DIAGNOSIS — J09X1 Influenza due to identified novel influenza A virus with pneumonia: Secondary | ICD-10-CM | POA: Diagnosis not present

## 2015-11-05 DIAGNOSIS — K2270 Barrett's esophagus without dysplasia: Secondary | ICD-10-CM | POA: Diagnosis not present

## 2015-11-05 DIAGNOSIS — E785 Hyperlipidemia, unspecified: Secondary | ICD-10-CM | POA: Diagnosis not present

## 2015-11-06 DIAGNOSIS — J188 Other pneumonia, unspecified organism: Secondary | ICD-10-CM | POA: Diagnosis not present

## 2015-11-06 DIAGNOSIS — J09X1 Influenza due to identified novel influenza A virus with pneumonia: Secondary | ICD-10-CM | POA: Diagnosis not present

## 2015-11-06 DIAGNOSIS — Z87891 Personal history of nicotine dependence: Secondary | ICD-10-CM | POA: Diagnosis not present

## 2015-11-06 DIAGNOSIS — K2270 Barrett's esophagus without dysplasia: Secondary | ICD-10-CM | POA: Diagnosis not present

## 2015-11-06 DIAGNOSIS — K219 Gastro-esophageal reflux disease without esophagitis: Secondary | ICD-10-CM | POA: Diagnosis not present

## 2015-11-06 DIAGNOSIS — E785 Hyperlipidemia, unspecified: Secondary | ICD-10-CM | POA: Diagnosis not present

## 2015-11-07 ENCOUNTER — Ambulatory Visit (INDEPENDENT_AMBULATORY_CARE_PROVIDER_SITE_OTHER): Payer: Medicare Other | Admitting: Adult Health

## 2015-11-07 ENCOUNTER — Encounter: Payer: Self-pay | Admitting: Adult Health

## 2015-11-07 VITALS — BP 96/66 | HR 97 | Temp 97.8°F | Ht 72.0 in | Wt 150.0 lb

## 2015-11-07 DIAGNOSIS — R05 Cough: Secondary | ICD-10-CM

## 2015-11-07 DIAGNOSIS — R059 Cough, unspecified: Secondary | ICD-10-CM

## 2015-11-07 NOTE — Patient Instructions (Signed)
Follow med calendar closely and bring to each visit.  Follow up with Dr. Melvyn Novas  In 2 months and As needed

## 2015-11-07 NOTE — Progress Notes (Signed)
Subjective:    Patient ID: [name], male    DOB: Oct 27, 1950,     MRN: [id number]    Brief patient profile:  55 yowm quit smoking in 1989 with pattern of pattern of bad cough esp with colds no change before quit or after eval by allergist in [name] referred to pulmonary clinic 08/21/2015 by [name] with "the worst ever cough " = lasted longer / more severe.  Prev w/u by [name] in 2009 with nl pfts.     History of Present Illness  08/21/2015 1st [facility] office visit/ [facility]   Chief Complaint  Patient presents with  . Pulmonary Consult    Referred by [name]. Pt c/o cough x 1 month- prod with clear, sticky sputum. He has noticed cough is trigerred by talking and exertion. He states he "goes into spasms" 25-30 x per day. He states he has coughed until almost vomits.   usual spells x lifetime  Last  only a few weeks /this did not feel like a typical cold  At onset one month prior to OV   maint on prilosec 20 mg bid pre- onset  Already 4 abx / pred/ no better  Better at hs/ worse with talking  Sometimes ant cp diffuse fleeting p severe cough  rec The key to effective treatment for your cough is eliminating the non-stop cycle of cough you're stuck in long enough to let your airway heal completely and then see if there is anything still making you cough once you stop the cough suppression, but this should take no more than 5 days to figure out First take delsym two tsp every 12 hours and supplement if needed with  Tylenol #3  up to 1-2 every 4 hours to suppress the urge to cough at all or even clear your throat.   Once you have eliminated the cough for 3 straight days try reducing the Tylenol #3 first,  then the delsym as tolerated.   Prednisone 10 mg take  4 each am x 2 days,   2 each am x 2 days,  1 each am x 2 days and stop (this is to eliminate allergies and inflammation from coughing) Whenever coughing > prilosec 20 x 2 x 30 min x before bfast and supper - once  you are better x one week then change back to previous dose    GERD diet  Always cough into the flutter valve to prevent airway trauma    08/26/2015 acute extended ov/[facility] re: persistent severe cough/ finishing bactrim rx today  Chief Complaint  Patient presents with  . Acute Visit    Pt states woke up with bruise on his left side on 08/25/14- does not recall any injury. He feels pain underneath the left arm. He states that he is having longer periods of time without cough, but cough is more severe when he does cough. He also c/o insomnia since last here.   never took more than one tylenol #3 - cough remains non productive/ denies excess/ purulent sputum or mucus plugs   Extensive bruising L chest wall with pain in L axilla with coughing  rec First take delsym two tsp every 12 hours and supplement if needed with  Tylenol #3  up to 1-2 every 4 hours to suppress the urge to cough at all or even clear your throat. Swallowing water or using ice chips/non mint and menthol containing candies (such as lifesavers or sugarless jolly ranchers) are  also effective.  You should rest your voice and avoid activities that you know make you cough. Once you have eliminated the cough for 3 straight days try reducing the Tylenol #3 first,  then the delsym as tolerated.   Whenever coughing > prilosec 20 x 2 x 30 min x before bfast and supper - once you are better x one week then change back to previous dose    GERD  Always cough into the flutter valve to prevent airway trauma  Xanax 0.5 mg at bedtime as needed  Sinus CT  > neg     09/02/2015  f/u ov/[facility] re: cough since jan 1/ not resp to tylenol #3 x 2 q4, not taking ppi as rec  Chief Complaint  Patient presents with  . Follow-up    Pt c/o continued cough with clear mucus that is more present when pt is in any kind of reclining position. Pt denies wheeze/CP/tightness. Pt reports that once he starts coughing he begins to have "spasms" that last several minutes  and often require sips of water and hard candy to resolve. Pt is very concerned and per wife is getting depressed. Pt also c/o frequents "sweats" and low grade fevers.   sense of something stuck at suprasternal notch and if just coughs hard enough it will come up / cp resolved  New night sweats since 2/26 when temp of 100 per wife but no fever since. rec Please remember to go to the   x-ray department downstairs for your tests - we will call you with the results when they are available. Avoid speaking/ sleep in recliner to prevent coughing fits and use the flutter on max resistance as much as possible Prilosec 20 mg x 2 Take 30- 60 min before your first and last meals of the day and Pepcid ac 20 mg at bedtime For drainage / throat tickle try take CHLORPHENIRAMINE  4 mg - take one every 4 hours as needed - available over the counter- may cause drowsiness so start with just a bedtime dose or two and see how you tolerate it before trying in daytime   Depomedrol 120 mg IM today  If not better w/in a few days > gabapentin 100 mg three times a day with meals  Add cxr c/w pna > rx levaquin    Admit date: 09/04/2015 Discharge date: 09/09/2015  Principal Problem:  CAP (community acquired pneumonia) Active Problems:  HLD (hyperlipidemia)  Depression  ADD (attention deficit disorder)  G E R D  Hyperparathyroidism, primary, s/p MI parathyroidectomy 7/18, 3.1 gm adenoma  Upper airway cough syndrome  Acute respiratory distress (HCC)  Barrett's esophagus  Dyspnea on exertion  Influenza  Cough    09/12/2015  Post hosp f/u ov/[facility] re:  Influenza pna/ cough since Jan 2017 /02 dep since d/c on neurontin 100 tid  Chief Complaint  Patient presents with  . Follow-up    Pt c/o continued SOB and labored breathing, still some cough - which improved while in hospital - but is now coming back some, and some possible thrush mouth/tongue. Pt denies CP/tightness. Pt is on O2 more frequently and would  like to discuss getting a POC.   Still feels sensation of too much throat mucus but no excess production/ just dry hacking - all fever/ sweats resolved  Was d/c on pulmicort neb but too expendsive so did not purchase  rec Clotrimazole troche 10 mg four x daily x 3 days and  Stop (should heal your tongue )  Stop tussionex to see if bladder function better  02 is 2lpm bedtime only  Take delsym two tsp every 12 hours with flutter and supplement if needed with  Tylenol #3  up to 2 every 4 hours to suppress the urge to cough. Swallowing water or using ice chips/non mint and menthol containing candies (such as lifesavers or sugarless jolly ranchers) are also effective.  You should rest your voice and avoid activities that you know make you cough. Once you have eliminated the cough for 3 straight days try reducing the Tylenol #3 first,  then the delsym as tolerated. See Tammy NP w/in 2 weeks(or first available after that)  with all your medications > did not do as "all better"   10/02/2015  Acute extended  ov/[facility] re: recurrent cough  Chief Complaint  Patient presents with  . Acute Visit    Cough had completely resolved, then developed "new cough" 6 days ago. This cough is prod with large amounts of clear sputum.   eval by Constance Holster  09/24/15 because still tongue was irritated reporting per notes sore throat and hoarseness > erythema of VC's only  And nor recs - after this pt reports no clearing throat or coughing  while on delsym  Then stopped neurontin due to side effects and cough worse since, though convinced the cough flared before he stopped it, this part of hx is unclear but now cc  recurrent cough more upright than lying down  Using nice lozenges/ sporadic daytime cough , min productive mucoid sputum/ no flutter valve in hand   >>delsym /tylenol #3 , zyrtec   11/07/2015 Follow up ; Recurrent Cough  Pt returns for 6 week follow up .  We reviewed all his medications organize them into a medication  count with patient education. Neck 5 appears he is take his medications correctly Stopped delsym , tylenol #3 , felt it did not help and memory was being affected.  Cough is much better.  Breathing seems to be improved as well.  Patient has been having some difficulty with muscle weakness and involuntary movements of his legs or arms. He was admitted and seen by neurology. MRI of the brain and spine were unrevealing. TSH was mildly abnormal at 4.71. Patient has ongoing follow-up with neurology and outpatient setting..    Current Medications, Allergies, Complete Past Medical History, Past Surgical History, Family History, and Social History were reviewed in [employer] record.  ROS  The following are not active complaints unless bolded sore throat, dysphagia, dental problems, itching, sneezing,  nasal congestion or excess/ purulent secretions, ear ache,   fever, chills, sweats, unintended wt loss, classically pleuritic or exertional cp, hemoptysis,  orthopnea pnd or leg swelling, presyncope, palpitations, abdominal pain, anorexia, nausea, vomiting, diarrhea  or change in bowel or bladder habits, change in stools or urine, dysuria,hematuria,  rash, arthralgias, visual complaints, headache, numbness, weakness or ataxia or problems with walking or coordination,   or memory.            Objective:   Physical Exam  Elderly wm >> stated age, using walker  09/02/2015        153  > 09/12/2015  150 >  10/02/2015  147 Filed Vitals:   11/07/15 1443  BP: 96/66  Pulse: 97  Temp: 97.8 F (36.6 C)  TempSrc: Oral  Height: 6' (1.829 m)  Weight: 150 lb (68.04 kg)  SpO2: 95%      Vital signs reviewed   HEENT: nl  dentition, turbinates, - oropharynx pristine.  Nl external ear canals without cough reflex   NECK :  without JVD/Nodes/TM/ nl carotid upstrokes bilaterally   LUNGS: no acc muscle use,  Nl contour chest which is clear to A and P bilaterally without cough on insp or  exp maneuvers   CV:  RRR  no s3 or murmur or increase in P2, tr  edema   ABD:  soft and nontender with nl inspiratory excursion in the supine position. No bruits or organomegaly, bowel sounds nl  MS:  ext warm without deformities, calf tenderness, cyanosis or clubbing No obvious joint restrictions or tremors   SKIN: warm and dry with no lesions   NEURO:  alert, approp, nl sensorium with  no motor deficits    CXR PA and Lateral:   10/02/2015 :      marked improvement in bilateral upper lobe infiltrates with min residual changes in LUL        Tammy Parrett NP-C  [facility]  ,11/07/2015          Assessment & Plan:

## 2015-11-07 NOTE — Assessment & Plan Note (Signed)
Patient's medications were reviewed today and patient education was given. Computerized medication calendar was adjusted/completed   Cough is improved with trigger control   Plan  Follow med calendar closely and bring to each visit.  Follow up with Dr. Melvyn Novas  In 2 months and As needed

## 2015-11-07 NOTE — Progress Notes (Signed)
Chart and office note reviewed in detail  > agree with a/p as outlined    

## 2015-11-10 ENCOUNTER — Telehealth: Payer: Self-pay | Admitting: Internal Medicine

## 2015-11-10 NOTE — Telephone Encounter (Signed)
Called office, requesting last ov notes and any pertinent testing to below verified fax.  Nothing further needed.

## 2015-11-14 DIAGNOSIS — G259 Extrapyramidal and movement disorder, unspecified: Secondary | ICD-10-CM | POA: Diagnosis not present

## 2015-11-14 DIAGNOSIS — E038 Other specified hypothyroidism: Secondary | ICD-10-CM | POA: Diagnosis not present

## 2015-12-08 DIAGNOSIS — E038 Other specified hypothyroidism: Secondary | ICD-10-CM | POA: Diagnosis not present

## 2015-12-19 ENCOUNTER — Other Ambulatory Visit: Payer: Self-pay | Admitting: Family Medicine

## 2015-12-19 DIAGNOSIS — R911 Solitary pulmonary nodule: Secondary | ICD-10-CM

## 2015-12-24 DIAGNOSIS — H60392 Other infective otitis externa, left ear: Secondary | ICD-10-CM | POA: Diagnosis not present

## 2015-12-24 DIAGNOSIS — H66002 Acute suppurative otitis media without spontaneous rupture of ear drum, left ear: Secondary | ICD-10-CM | POA: Insufficient documentation

## 2015-12-24 DIAGNOSIS — H7293 Unspecified perforation of tympanic membrane, bilateral: Secondary | ICD-10-CM | POA: Diagnosis not present

## 2015-12-29 ENCOUNTER — Ambulatory Visit
Admission: RE | Admit: 2015-12-29 | Discharge: 2015-12-29 | Disposition: A | Discharge status: Home / Self Care | Payer: Medicare Other | Source: Ambulatory Visit | Attending: Family Medicine | Admitting: Family Medicine

## 2015-12-29 DIAGNOSIS — R911 Solitary pulmonary nodule: Secondary | ICD-10-CM

## 2015-12-29 DIAGNOSIS — J189 Pneumonia, unspecified organism: Secondary | ICD-10-CM | POA: Diagnosis not present

## 2015-12-29 MED ORDER — IOPAMIDOL (ISOVUE-300) INJECTION 61%
75.0000 mL | Freq: Once | INTRAVENOUS | Status: AC | PRN
Start: 2015-12-29 — End: 2015-12-29
  Administered 2015-12-29: 75 mL via INTRAVENOUS

## 2016-01-13 ENCOUNTER — Ambulatory Visit: Payer: Medicare Other | Admitting: Adult Health

## 2016-01-19 DIAGNOSIS — N5201 Erectile dysfunction due to arterial insufficiency: Secondary | ICD-10-CM | POA: Diagnosis not present

## 2016-01-19 DIAGNOSIS — N430 Encysted hydrocele: Secondary | ICD-10-CM | POA: Diagnosis not present

## 2016-01-19 DIAGNOSIS — R972 Elevated prostate specific antigen [PSA]: Secondary | ICD-10-CM | POA: Diagnosis not present

## 2016-02-10 ENCOUNTER — Ambulatory Visit: Payer: Medicare Other | Admitting: Neurology

## 2016-03-15 ENCOUNTER — Encounter (HOSPITAL_COMMUNITY): Payer: Self-pay | Admitting: Emergency Medicine

## 2016-03-15 ENCOUNTER — Observation Stay (HOSPITAL_COMMUNITY)
Admission: EM | Admit: 2016-03-15 | Discharge: 2016-03-17 | Disposition: A | Discharge status: Home / Self Care | Payer: Medicare Other | Attending: Internal Medicine | Admitting: Internal Medicine

## 2016-03-15 ENCOUNTER — Emergency Department (HOSPITAL_COMMUNITY): Payer: Medicare Other

## 2016-03-15 DIAGNOSIS — I441 Atrioventricular block, second degree: Secondary | ICD-10-CM | POA: Insufficient documentation

## 2016-03-15 DIAGNOSIS — R55 Syncope and collapse: Secondary | ICD-10-CM | POA: Insufficient documentation

## 2016-03-15 DIAGNOSIS — Y9289 Other specified places as the place of occurrence of the external cause: Secondary | ICD-10-CM | POA: Diagnosis not present

## 2016-03-15 DIAGNOSIS — R933 Abnormal findings on diagnostic imaging of other parts of digestive tract: Secondary | ICD-10-CM | POA: Diagnosis not present

## 2016-03-15 DIAGNOSIS — Z87891 Personal history of nicotine dependence: Secondary | ICD-10-CM | POA: Diagnosis not present

## 2016-03-15 DIAGNOSIS — Y93B9 Activity, other involving muscle strengthening exercises: Secondary | ICD-10-CM | POA: Insufficient documentation

## 2016-03-15 DIAGNOSIS — K449 Diaphragmatic hernia without obstruction or gangrene: Secondary | ICD-10-CM | POA: Diagnosis not present

## 2016-03-15 DIAGNOSIS — Z96642 Presence of left artificial hip joint: Secondary | ICD-10-CM | POA: Diagnosis not present

## 2016-03-15 DIAGNOSIS — Y999 Unspecified external cause status: Secondary | ICD-10-CM | POA: Diagnosis not present

## 2016-03-15 DIAGNOSIS — E785 Hyperlipidemia, unspecified: Secondary | ICD-10-CM | POA: Diagnosis present

## 2016-03-15 DIAGNOSIS — S0083XA Contusion of other part of head, initial encounter: Secondary | ICD-10-CM | POA: Insufficient documentation

## 2016-03-15 DIAGNOSIS — S20212A Contusion of left front wall of thorax, initial encounter: Secondary | ICD-10-CM | POA: Diagnosis not present

## 2016-03-15 DIAGNOSIS — W19XXXA Unspecified fall, initial encounter: Secondary | ICD-10-CM | POA: Insufficient documentation

## 2016-03-15 DIAGNOSIS — R079 Chest pain, unspecified: Secondary | ICD-10-CM | POA: Diagnosis not present

## 2016-03-15 DIAGNOSIS — S299XXA Unspecified injury of thorax, initial encounter: Secondary | ICD-10-CM | POA: Diagnosis present

## 2016-03-15 LAB — DIFFERENTIAL
BASOS ABS: 0 10*3/uL (ref 0.0–0.1)
BASOS PCT: 0 %
Eosinophils Absolute: 0.1 10*3/uL (ref 0.0–0.7)
Eosinophils Relative: 1 %
LYMPHS PCT: 14 %
Lymphs Abs: 1.7 10*3/uL (ref 0.7–4.0)
MONOS PCT: 6 %
Monocytes Absolute: 0.7 10*3/uL (ref 0.1–1.0)
NEUTROS ABS: 9.8 10*3/uL — AB (ref 1.7–7.7)
Neutrophils Relative %: 79 %

## 2016-03-15 LAB — HEPATIC FUNCTION PANEL
ALK PHOS: 64 U/L (ref 38–126)
ALT: 21 U/L (ref 17–63)
AST: 26 U/L (ref 15–41)
Albumin: 4.1 g/dL (ref 3.5–5.0)
TOTAL PROTEIN: 6.7 g/dL (ref 6.5–8.1)
Total Bilirubin: 0.5 mg/dL (ref 0.3–1.2)

## 2016-03-15 LAB — BASIC METABOLIC PANEL
Anion gap: 6 (ref 5–15)
BUN: 21 mg/dL — AB (ref 6–20)
CALCIUM: 9.2 mg/dL (ref 8.9–10.3)
CHLORIDE: 106 mmol/L (ref 101–111)
CO2: 29 mmol/L (ref 22–32)
CREATININE: 1.17 mg/dL (ref 0.61–1.24)
GFR calc Af Amer: >60 mL/min (ref >60)
GFR calc non Af Amer: >60 mL/min (ref >60)
GLUCOSE: 129 mg/dL — AB (ref 65–99)
Potassium: 4.2 mmol/L (ref 3.5–5.1)
Sodium: 141 mmol/L (ref 135–145)

## 2016-03-15 LAB — URINALYSIS, ROUTINE W REFLEX MICROSCOPIC
BILIRUBIN URINE: NEGATIVE
GLUCOSE, UA: NEGATIVE mg/dL
HGB URINE DIPSTICK: NEGATIVE
Ketones, ur: NEGATIVE mg/dL
Leukocytes, UA: NEGATIVE
Nitrite: NEGATIVE
PROTEIN: NEGATIVE mg/dL
Specific Gravity, Urine: 1.037 — ABNORMAL HIGH (ref 1.005–1.030)
pH: 5.5 (ref 5.0–8.0)

## 2016-03-15 LAB — CBC
HCT: 44.2 % (ref 39.0–52.0)
Hemoglobin: 14.9 g/dL (ref 13.0–17.0)
MCH: 30.6 pg (ref 26.0–34.0)
MCHC: 33.7 g/dL (ref 30.0–36.0)
MCV: 90.8 fL (ref 78.0–100.0)
PLATELETS: 185 10*3/uL (ref 150–400)
RBC: 4.87 MIL/uL (ref 4.22–5.81)
RDW: 12.8 % (ref 11.5–15.5)
WBC: 12.2 10*3/uL — ABNORMAL HIGH (ref 4.0–10.5)

## 2016-03-15 LAB — CBG MONITORING, ED: Glucose-Capillary: 136 mg/dL — ABNORMAL HIGH (ref 65–99)

## 2016-03-15 LAB — URINE MICROSCOPIC-ADD ON

## 2016-03-15 LAB — I-STAT TROPONIN, ED: TROPONIN I, POC: 0 ng/mL (ref 0.00–0.08)

## 2016-03-15 MED ORDER — DM-GUAIFENESIN ER 30-600 MG PO TB12
1.0000 | ORAL_TABLET | Freq: Two times a day (BID) | ORAL | Status: DC
  Administered 2016-03-16: 1 via ORAL
  Filled 2016-03-15: qty 1

## 2016-03-15 MED ORDER — IOPAMIDOL (ISOVUE-300) INJECTION 61%
INTRAVENOUS | Status: AC
  Administered 2016-03-15: 100 mL
  Filled 2016-03-15: qty 100

## 2016-03-15 NOTE — ED Provider Notes (Signed)
[facility] DEPT Provider Note   CSN: [id number] Arrival date & time: 03/15/16  2022     History   Chief Complaint Chief Complaint  Patient presents with  . Loss of Consciousness  . Chest Pain    HPI [name] is a 65 y.o. male.  The history is provided by the patient and the spouse.  He was on an exercise bicycle and started feeling like he was having a panic attack. He apparently had a syncopal episode with unknown duration of loss of consciousness. He returned home and was not aware of what it had. His wife noted that he was bleeding and had bruises to his head and had urinated on himself. He is also complaining pain in the left lateral chest and thinks he may have broken rib. He has been having palpitations for the last several weeks which have been getting worse. He denies chest pain other than the pain from her acute his chest wall. There is has been no nausea or vomiting. His wife has noted that he has been having difficulty with short-term memory since this fall.  Past Medical History:  Diagnosis Date  . Arthritis   . Bronchitis    history of  . 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 GERD (gastroesophageal reflux disease)   . Hiatal hernia   . 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 Hypercalcemia   . Lipoma    left forearm (3)  . Mitral valve prolapse    does not see cardiologist for. last stress test 2003  . Pneumonia 2009  . Tumor cells, benign    lung, left side    Patient Active Problem List   Diagnosis Date Noted  . Malnutrition of moderate degree 10/21/2015  . Hyperkinetic disorder   . Choreiform movements 10/20/2015  . Chronic respiratory failure with hypoxia ([place]) 09/14/2015  . Cough   . Acute respiratory distress (HCC) 09/04/2015  . Barrett's esophagus 09/04/2015  . Dyspnea on exertion 09/04/2015  . Influenza   . CAP (community acquired pneumonia) 09/03/2015  . Upper airway cough syndrome 08/21/2015  . Chest pain 05/13/2015  . Bilateral  inguinal hernia (BIH) 01/08/2014  . Ileus ([place]) 08/18/2012  . Pain due to Left hip joint prosthesis 06/13/2011  . Hyperparathyroidism, primary, s/p MI parathyroidectomy 7/18, 3.1 gm adenoma 02/19/2011  . LIPOMAS, MULTIPLE 10/24/2007  . HLD (hyperlipidemia) 10/24/2007  . Depression 10/24/2007  . ADD (attention deficit disorder) 10/24/2007  . OTITIS MEDIA 10/24/2007  . SINUSITIS, CHRONIC 10/24/2007  . PNEUMONIA, RECURRENT 10/24/2007  . HIP FRACTURE, LEFT 10/24/2007  . GENITAL HERPES, HX OF 10/24/2007  . TOBACCO USE, QUIT 10/24/2007  . HIP REPLACEMENT, TOTAL, HX OF 10/24/2007  . Other acquired absence of organ 10/24/2007  . G E R D 07/13/2007    Past Surgical History:  Procedure Laterality Date  . APPENDECTOMY  1984  . CATARACT EXTRACTION     L and R eye  . CHOLECYSTECTOMY  2002  . JOINT REPLACEMENT     Lt hip  . MASTOIDECTOMY  1996  . NASAL SEPTUM SURGERY     sinus surgery  . PARATHYROIDECTOMY    . TOTAL HIP REVISION  06/14/2011   Procedure: TOTAL HIP REVISION;  Surgeon: Kerin Salen;  Location: [facility];  Service: Orthopedics;  Laterality: Left;  Left Acetabular  Hip Revision       Home Medications    Prior to Admission medications   Medication Sig Start Date End  Date Taking? Authorizing Provider  ALPRAZolam Duanne Moron) 0.5 MG tablet Take 1 tablet (0.5 mg total) by mouth at bedtime as needed for anxiety. 08/26/15   Tanda Rockers, MD  buPROPion [facility] SR) 200 MG 12 hr tablet Take 200 mg by mouth 2 (two) times daily.    Historical Provider, MD  cetirizine (ZYRTEC) 10 MG tablet Take 10 mg by mouth at bedtime.    Historical Provider, MD  dextromethorphan (DELSYM) 30 MG/5ML liquid Take 10 mLs by mouth every 12 (twelve) hours as needed for cough (with flutter valve).     Historical Provider, MD  docusate sodium (COLACE) 100 MG capsule Take 200 mg by mouth daily.    Historical Provider, MD  famotidine (PEPCID) 20 MG tablet Take 1 tablet (20 mg total) by mouth at bedtime. 09/09/15    Reyne Dumas, MD  FIBER SELECT GUMMIES CHEW Chew 2 each by mouth daily.    Historical Provider, MD  FLUoxetine (PROZAC) 40 MG capsule Take 40 mg by mouth every morning.     Historical Provider, MD  fluticasone (FLONASE) 50 MCG/ACT nasal spray Place 2 sprays into both nostrils 2 (two) times daily. Patient taking differently: Place 2 sprays into both nostrils daily.  09/09/15   Reyne Dumas, MD  methylphenidate (RITALIN) 10 MG tablet Take 10 mg by mouth 3 (three) times daily.      Historical Provider, MD  Multiple Vitamin (MULTIVITAMIN WITH MINERALS) TABS tablet Take 1 tablet by mouth daily.    Historical Provider, MD  omeprazole (PRILOSEC) 20 MG capsule Take 40 mg by mouth 2 (two) times daily before a meal.     Historical Provider, MD  polyethylene glycol (MIRALAX / GLYCOLAX) packet Use daily as directed as needed for constipation    Historical Provider, MD  Probiotic Product (PROBIOTIC DAILY) CAPS Take 1 capsule by mouth every morning. Reported on 08/21/2015    Historical Provider, MD  Respiratory Therapy Supplies (FLUTTER) DEVI Use as directed 08/21/15   Tanda Rockers, MD  simvastatin (ZOCOR) 20 MG tablet Take 20 mg by mouth at bedtime.      Historical Provider, MD  tamsulosin (FLOMAX) 0.4 MG CAPS capsule Take 0.4 mg by mouth daily after supper.    Historical Provider, MD    Family History Family History  Problem Relation Age of Onset  . Lung cancer Father 73    smoked  . Heart Problems Father   . Cancer Brother     prostate  . Heart Problems Mother     Social History Social History  Substance Use Topics  . Smoking status: Former Smoker    Packs/day: 1.50    Years: 24.00    Types: Cigarettes    Quit date: 07/06/1987  . Smokeless tobacco: Former Systems developer  . Alcohol use 0.6 oz/week    1 Cans of beer per week     Allergies   Ciprofloxacin hcl; Asa [aspirin]; and Gabapentin   Review of Systems Review of Systems  All other systems reviewed and are negative.    Physical  Exam Updated Vital Signs BP 132/71 (BP Location: Right Arm)   Pulse 96   Temp 99 F (37.2 C) (Oral)   Resp 15   Ht 6' (1.829 m)   Wt 155 lb (70.3 kg)   SpO2 100%   BMI 21.02 kg/m   Physical Exam  Nursing note and vitals reviewed.  65 year old male, resting comfortably and in no acute distress. Vital signs are normal. Oxygen saturation is 100%,  which is normal. Head is normocephalic. Abrasions are seen to the forehead. PERRLA, EOMI. Oropharynx is clear. Neck is nontender and supple without adenopathy or JVD. Back is nontender and there is no CVA tenderness. Lungs are clear without rales, wheezes, or rhonchi. Chest is moderately tender in the left lateral chest wall. There is no crepitus. Heart rhythm is irregular without murmur. Abdomen is soft, flat, with moderate tenderness in the left upper quadrant. There is no rebound or guarding. There are no masses or hepatosplenomegaly and peristalsis is normoactive. Extremities have no cyanosis or edema, full range of motion is present. Skin is warm and dry without rash. Neurologic: He is awake and alert and oriented although there is a delay in response when he is asked the day of the week, cranial nerves are intact, there are no motor or sensory deficits.  ED Treatments / Results  Labs (all labs ordered are listed, but only abnormal results are displayed) Labs Reviewed  BASIC METABOLIC PANEL - Abnormal; Notable for the following:       Result Value   Glucose, Bld 129 (*)    BUN 21 (*)    All other components within normal limits  CBC - Abnormal; Notable for the following:    WBC 12.2 (*)    All other components within normal limits  URINALYSIS, ROUTINE W REFLEX MICROSCOPIC (NOT AT [facility]) - Abnormal; Notable for the following:    APPearance TURBID (*)    Specific Gravity, Urine 1.037 (*)    All other components within normal limits  DIFFERENTIAL - Abnormal; Notable for the following:    Neutro Abs 9.8 (*)    All other components  within normal limits  HEPATIC FUNCTION PANEL - Abnormal; Notable for the following:    Bilirubin, Direct <0.1 (*)    All other components within normal limits  URINE MICROSCOPIC-ADD ON - Abnormal; Notable for the following:    Squamous Epithelial / LPF 0-5 (*)    Bacteria, UA FEW (*)    All other components within normal limits  CBG MONITORING, ED - Abnormal; Notable for the following:    Glucose-Capillary 136 (*)    All other components within normal limits  I-STAT TROPOININ, ED    EKG  EKG Interpretation  Date/Time:  Monday March 15 2016 20:31:46 EDT Ventricular Rate:  93 PR Interval:    QRS Duration: 82 QT Interval:  364 QTC Calculation: 452 R Axis:   89 Text Interpretation:  ** Critical Test Result: AV Block Sinus tachycardia with 2nd degree A-V block (Mobitz I) Abnormal ECG When compared with ECG of 09/04/2015, Mobitz I 2-degree AV block (Wenckebach block) is now Present Confirmed by [facility]  MD, Lidiya Reise ([id number]) on 03/15/2016 8:47:03 PM       Radiology Ct Head Wo Contrast  Result Date: 03/15/2016 CLINICAL DATA:  Unwitnessed syncopal episode at the gym today. Forehead laceration. EXAM: CT HEAD WITHOUT CONTRAST CT CERVICAL SPINE WITHOUT CONTRAST TECHNIQUE: Multidetector CT imaging of the head and cervical spine was performed following the standard protocol without intravenous contrast. Multiplanar CT image reconstructions of the cervical spine were also generated. COMPARISON:  10/21/2015 FINDINGS: CT HEAD FINDINGS Brain: No evidence of acute infarction, hemorrhage, hydrocephalus, extra-axial collection or mass lesion/mass effect. Vascular: No hyperdense vessel or unexpected calcification. Skull: Normal. Negative for fracture or focal lesion. Sinuses/Orbits: No acute finding. CT CERVICAL SPINE FINDINGS Alignment: Normal Skull base and vertebrae: No acute fracture. No primary bone lesion or focal pathologic process. Soft tissues and spinal canal:  No prevertebral fluid or swelling. No  visible canal hematoma. Disc levels: Moderate degenerative cervical disc disease, greatest at C5-6 and C6-7. Upper chest: No significant abnormality Other: IMPRESSION: 1. No acute intracranial findings.  Normal brain. 2. No acute cervical spine findings. Moderate cervical degenerative disc disease at C5-6 and C6-7. Electronically Signed   By: Andreas Newport M.D.   On: 03/15/2016 22:54   Ct Chest W Contrast  Result Date: 03/15/2016 CLINICAL DATA:  Unwitnessed syncopal episode at the gym today. Right-sided chest pain. EXAM: CT CHEST, ABDOMEN, AND PELVIS WITH CONTRAST TECHNIQUE: Multidetector CT imaging of the chest, abdomen and pelvis was performed following the standard protocol during bolus administration of intravenous contrast. CONTRAST:  1ooml ISOVUE-300 IOPAMIDOL (ISOVUE-300) INJECTION 61% COMPARISON:  08/18/2012 FINDINGS: CT CHEST FINDINGS Cardiovascular: The thoracic aorta is normal in caliber and intact. No acute intrathoracic vascular abnormality. Mediastinum/Nodes: Normal mediastinum and hila.  No adenopathy. Lungs/Pleura: There is a 5 x 6 mm nodule at the lateral periphery of the left lower lobe, unchanged from 08/18/2012. This is benign and does not require an additional evaluation. The lungs are otherwise clear except for minor scarring in the apices. Airways are patent. Musculoskeletal: No significant skeletal lesion. No fracture is evident. Moderate thoracic degenerative disc disease is present. CT ABDOMEN PELVIS FINDINGS Hepatobiliary: Cholecystectomy. The liver and bile ducts appear normal. Pancreas: Normal Spleen: Normal Adrenals/Urinary Tract: Benign simple renal cysts bilaterally, measuring up to 3.3 cm at the right upper pole and 6.2 cm at the left posterior midpole. The adrenals and kidneys are otherwise normal in appearance. There is no urinary calculus evident. There is no hydronephrosis or ureteral dilatation. Collecting systems and ureters appear unremarkable. The urinary bladder is  unremarkable. Stomach/Bowel: Hiatal hernia, otherwise normal appearances of the stomach, small bowel and colon. Prior appendectomy. Vascular/Lymphatic: The abdominal aorta is normal in caliber. There is no atherosclerotic calcification. There is no adenopathy in the abdomen or pelvis. Reproductive: Unremarkable Other: No acute findings are evident in the abdomen or pelvis. No ascites. Musculoskeletal: No significant skeletal lesion. Negative for acute fracture. IMPRESSION: 1. No acute findings are evident in the chest, abdomen or pelvis. 2. Benign 5 x 6 mm left lower lobe pulmonary nodule. This does not require additional evaluation. 3. Small hiatal hernia. Electronically Signed   By: Andreas Newport M.D.   On: 03/15/2016 23:06   Ct Cervical Spine Wo Contrast  Result Date: 03/15/2016 CLINICAL DATA:  Unwitnessed syncopal episode at the gym today. Forehead laceration. EXAM: CT HEAD WITHOUT CONTRAST CT CERVICAL SPINE WITHOUT CONTRAST TECHNIQUE: Multidetector CT imaging of the head and cervical spine was performed following the standard protocol without intravenous contrast. Multiplanar CT image reconstructions of the cervical spine were also generated. COMPARISON:  10/21/2015 FINDINGS: CT HEAD FINDINGS Brain: No evidence of acute infarction, hemorrhage, hydrocephalus, extra-axial collection or mass lesion/mass effect. Vascular: No hyperdense vessel or unexpected calcification. Skull: Normal. Negative for fracture or focal lesion. Sinuses/Orbits: No acute finding. CT CERVICAL SPINE FINDINGS Alignment: Normal Skull base and vertebrae: No acute fracture. No primary bone lesion or focal pathologic process. Soft tissues and spinal canal: No prevertebral fluid or swelling. No visible canal hematoma. Disc levels: Moderate degenerative cervical disc disease, greatest at C5-6 and C6-7. Upper chest: No significant abnormality Other: IMPRESSION: 1. No acute intracranial findings.  Normal brain. 2. No acute cervical spine  findings. Moderate cervical degenerative disc disease at C5-6 and C6-7. Electronically Signed   By: Andreas Newport M.D.   On: 03/15/2016 22:54  Ct Abdomen Pelvis W Contrast  Result Date: 03/15/2016 CLINICAL DATA:  Unwitnessed syncopal episode at the gym today. Right-sided chest pain. EXAM: CT CHEST, ABDOMEN, AND PELVIS WITH CONTRAST TECHNIQUE: Multidetector CT imaging of the chest, abdomen and pelvis was performed following the standard protocol during bolus administration of intravenous contrast. CONTRAST:  1ooml ISOVUE-300 IOPAMIDOL (ISOVUE-300) INJECTION 61% COMPARISON:  08/18/2012 FINDINGS: CT CHEST FINDINGS Cardiovascular: The thoracic aorta is normal in caliber and intact. No acute intrathoracic vascular abnormality. Mediastinum/Nodes: Normal mediastinum and hila.  No adenopathy. Lungs/Pleura: There is a 5 x 6 mm nodule at the lateral periphery of the left lower lobe, unchanged from 08/18/2012. This is benign and does not require an additional evaluation. The lungs are otherwise clear except for minor scarring in the apices. Airways are patent. Musculoskeletal: No significant skeletal lesion. No fracture is evident. Moderate thoracic degenerative disc disease is present. CT ABDOMEN PELVIS FINDINGS Hepatobiliary: Cholecystectomy. The liver and bile ducts appear normal. Pancreas: Normal Spleen: Normal Adrenals/Urinary Tract: Benign simple renal cysts bilaterally, measuring up to 3.3 cm at the right upper pole and 6.2 cm at the left posterior midpole. The adrenals and kidneys are otherwise normal in appearance. There is no urinary calculus evident. There is no hydronephrosis or ureteral dilatation. Collecting systems and ureters appear unremarkable. The urinary bladder is unremarkable. Stomach/Bowel: Hiatal hernia, otherwise normal appearances of the stomach, small bowel and colon. Prior appendectomy. Vascular/Lymphatic: The abdominal aorta is normal in caliber. There is no atherosclerotic calcification.  There is no adenopathy in the abdomen or pelvis. Reproductive: Unremarkable Other: No acute findings are evident in the abdomen or pelvis. No ascites. Musculoskeletal: No significant skeletal lesion. Negative for acute fracture. IMPRESSION: 1. No acute findings are evident in the chest, abdomen or pelvis. 2. Benign 5 x 6 mm left lower lobe pulmonary nodule. This does not require additional evaluation. 3. Small hiatal hernia. Electronically Signed   By: Andreas Newport M.D.   On: 03/15/2016 23:06    Procedures Procedures (including critical care time)  Medications Ordered in ED Medications - No data to display   Initial Impression / Assessment and Plan / ED Course  I have reviewed the triage vital signs and the nursing notes.  Pertinent labs & imaging results that were available during my care of the patient were reviewed by me and considered in my medical decision making (see chart for details).  Clinical Course    Syncope with prodrome. ECG shows second-degree AV block Mobitz type I. This degree of block is typically not associated with syncope but is concerning that he may have episodes of higher grade AV block. Old records are reviewed and he has had a complex history of recently with slow weight loss and cough but had been doing better and actually is walking 3-5 miles a day. Review of his medications showed that he is on tamsulosin and fluoxetine which are both associated with AV block. He is sent for CT scans to rule out acute injury from his fall and will need to be admitted for further evaluation.  CT scans show no acute injury. Laboratory workup is unremarkable. Mild increase in urine creatinine is not felt to be clinically significant. Case is discussed with Dr. Hal Hope of [facility] who agrees to admit the patient under observation status.  Final Clinical Impressions(s) / ED Diagnoses   Final diagnoses:  Syncope, unspecified syncope type  Mobitz type 1 second degree  atrioventricular block  Forehead contusion, initial encounter  Chest wall contusion, left,  initial encounter    New Prescriptions New Prescriptions   No medications on file     Delora Fuel, MD [id number]

## 2016-03-15 NOTE — ED Triage Notes (Signed)
Pt here after having a syncopal episode at the gym. Pt sts he was riding an exercise bike and "went out" Pt has abrasions to face and forehead. Pt also reports right sided chest pain and left sided rib pain. Pt now a/o x 4, no neuro deficits at present. Wife reports "heart flutters" and htn the last 3 weeks.

## 2016-03-15 NOTE — ED Notes (Signed)
Stroke swallow screen given and passed without any problems.  Monitor remains NSR at this time.

## 2016-03-16 ENCOUNTER — Observation Stay (HOSPITAL_COMMUNITY): Payer: Medicare Other

## 2016-03-16 ENCOUNTER — Encounter (HOSPITAL_COMMUNITY): Payer: Self-pay | Admitting: Internal Medicine

## 2016-03-16 DIAGNOSIS — R55 Syncope and collapse: Secondary | ICD-10-CM | POA: Diagnosis not present

## 2016-03-16 DIAGNOSIS — S20212A Contusion of left front wall of thorax, initial encounter: Secondary | ICD-10-CM | POA: Diagnosis not present

## 2016-03-16 DIAGNOSIS — I441 Atrioventricular block, second degree: Secondary | ICD-10-CM

## 2016-03-16 LAB — COMPREHENSIVE METABOLIC PANEL
ALT: 20 U/L (ref 17–63)
ANION GAP: 7 (ref 5–15)
AST: 24 U/L (ref 15–41)
Albumin: 3.5 g/dL (ref 3.5–5.0)
Alkaline Phosphatase: 60 U/L (ref 38–126)
BUN: 17 mg/dL (ref 6–20)
CALCIUM: 8.8 mg/dL — AB (ref 8.9–10.3)
CHLORIDE: 106 mmol/L (ref 101–111)
CO2: 27 mmol/L (ref 22–32)
Creatinine, Ser: 1.11 mg/dL (ref 0.61–1.24)
GFR calc non Af Amer: >60 mL/min (ref >60)
Glucose, Bld: 96 mg/dL (ref 65–99)
Potassium: 4 mmol/L (ref 3.5–5.1)
SODIUM: 140 mmol/L (ref 135–145)
Total Bilirubin: 0.6 mg/dL (ref 0.3–1.2)
Total Protein: 5.9 g/dL — ABNORMAL LOW (ref 6.5–8.1)

## 2016-03-16 LAB — RAPID URINE DRUG SCREEN, HOSP PERFORMED
AMPHETAMINES: NOT DETECTED
BENZODIAZEPINES: NOT DETECTED
Barbiturates: NOT DETECTED
Cocaine: NOT DETECTED
OPIATES: NOT DETECTED
TETRAHYDROCANNABINOL: NOT DETECTED

## 2016-03-16 LAB — CBC
HCT: 39.5 % (ref 39.0–52.0)
HEMOGLOBIN: 13.4 g/dL (ref 13.0–17.0)
MCH: 30.7 pg (ref 26.0–34.0)
MCHC: 33.9 g/dL (ref 30.0–36.0)
MCV: 90.6 fL (ref 78.0–100.0)
Platelets: 148 10*3/uL — ABNORMAL LOW (ref 150–400)
RBC: 4.36 MIL/uL (ref 4.22–5.81)
RDW: 12.8 % (ref 11.5–15.5)
WBC: 8.5 10*3/uL (ref 4.0–10.5)

## 2016-03-16 LAB — TROPONIN I
Troponin I: <0.03 ng/mL (ref <0.03)
Troponin I: <0.03 ng/mL (ref <0.03)
Troponin I: <0.03 ng/mL (ref <0.03)

## 2016-03-16 LAB — TSH: TSH: 7.139 u[IU]/mL — ABNORMAL HIGH (ref 0.350–4.500)

## 2016-03-16 LAB — CK: CK TOTAL: 296 U/L (ref 49–397)

## 2016-03-16 MED ORDER — DOCUSATE SODIUM 100 MG PO CAPS
200.0000 mg | ORAL_CAPSULE | Freq: Every day | ORAL | Status: DC
  Administered 2016-03-16 – 2016-03-17 (×2): 200 mg via ORAL
  Filled 2016-03-16 (×2): qty 2

## 2016-03-16 MED ORDER — LORATADINE 10 MG PO TABS
10.0000 mg | ORAL_TABLET | Freq: Every day | ORAL | Status: DC
  Administered 2016-03-16 – 2016-03-17 (×2): 10 mg via ORAL
  Filled 2016-03-16 (×2): qty 1

## 2016-03-16 MED ORDER — CALCIUM POLYCARBOPHIL 625 MG PO TABS
625.0000 mg | ORAL_TABLET | Freq: Every day | ORAL | Status: DC
  Administered 2016-03-16 – 2016-03-17 (×2): 625 mg via ORAL
  Filled 2016-03-16 (×2): qty 1

## 2016-03-16 MED ORDER — BUPROPION HCL ER (SR) 150 MG PO TB12
150.0000 mg | ORAL_TABLET | Freq: Two times a day (BID) | ORAL | Status: DC
  Administered 2016-03-16 – 2016-03-17 (×3): 150 mg via ORAL
  Filled 2016-03-16 (×3): qty 1

## 2016-03-16 MED ORDER — FLUTICASONE PROPIONATE 50 MCG/ACT NA SUSP
2.0000 | Freq: Every day | NASAL | Status: DC
  Administered 2016-03-16 – 2016-03-17 (×2): 2 via NASAL
  Filled 2016-03-16: qty 16

## 2016-03-16 MED ORDER — FAMOTIDINE 20 MG PO TABS
20.0000 mg | ORAL_TABLET | Freq: Every day | ORAL | Status: DC
  Administered 2016-03-16 (×2): 20 mg via ORAL
  Filled 2016-03-16 (×2): qty 1

## 2016-03-16 MED ORDER — ACETAMINOPHEN 325 MG PO TABS
650.0000 mg | ORAL_TABLET | Freq: Four times a day (QID) | ORAL | Status: DC | PRN
Start: 2016-03-16 — End: 2016-03-17
  Administered 2016-03-16: 650 mg via ORAL
  Filled 2016-03-16: qty 2

## 2016-03-16 MED ORDER — ADULT MULTIVITAMIN W/MINERALS CH
1.0000 | ORAL_TABLET | Freq: Every day | ORAL | Status: DC
  Administered 2016-03-16 – 2016-03-17 (×2): 1 via ORAL
  Filled 2016-03-16 (×2): qty 1

## 2016-03-16 MED ORDER — FLUOXETINE HCL 20 MG PO CAPS
40.0000 mg | ORAL_CAPSULE | Freq: Every day | ORAL | Status: DC
  Administered 2016-03-16 – 2016-03-17 (×2): 40 mg via ORAL
  Filled 2016-03-16 (×2): qty 2

## 2016-03-16 MED ORDER — ALPRAZOLAM 0.5 MG PO TABS: 0.5000 mg | ORAL_TABLET | Freq: Every evening | ORAL | Status: DC | PRN

## 2016-03-16 MED ORDER — SODIUM CHLORIDE 0.9 % IV SOLN
INTRAVENOUS | Status: AC
  Administered 2016-03-16: 03:00:00 via INTRAVENOUS

## 2016-03-16 MED ORDER — SIMVASTATIN 20 MG PO TABS
20.0000 mg | ORAL_TABLET | Freq: Every day | ORAL | Status: DC
  Administered 2016-03-16: 20 mg via ORAL
  Filled 2016-03-16: qty 1

## 2016-03-16 MED ORDER — PANTOPRAZOLE SODIUM 40 MG PO TBEC
40.0000 mg | DELAYED_RELEASE_TABLET | Freq: Every day | ORAL | Status: DC
  Administered 2016-03-16 – 2016-03-17 (×2): 40 mg via ORAL
  Filled 2016-03-16 (×2): qty 1

## 2016-03-16 MED ORDER — POLYETHYLENE GLYCOL 3350 17 G PO PACK: 17.0000 g | PACK | Freq: Every day | ORAL | Status: DC | PRN

## 2016-03-16 MED ORDER — GUAIFENESIN ER 600 MG PO TB12
600.0000 mg | ORAL_TABLET | Freq: Two times a day (BID) | ORAL | Status: DC
  Administered 2016-03-16 – 2016-03-17 (×2): 600 mg via ORAL
  Filled 2016-03-16 (×2): qty 1

## 2016-03-16 MED ORDER — METHYLPHENIDATE HCL 5 MG PO TABS
10.0000 mg | ORAL_TABLET | Freq: Three times a day (TID) | ORAL | Status: DC
  Administered 2016-03-16 – 2016-03-17 (×3): 10 mg via ORAL
  Filled 2016-03-16 (×3): qty 2

## 2016-03-16 MED ORDER — TAMSULOSIN HCL 0.4 MG PO CAPS
0.4000 mg | ORAL_CAPSULE | Freq: Every day | ORAL | Status: DC
  Administered 2016-03-16: 0.4 mg via ORAL
  Filled 2016-03-16: qty 1

## 2016-03-16 NOTE — Progress Notes (Signed)
Notified Dr. Erlinda Hong neuro had not seen pt yet. She asked me to please call neurology to see when they would be seeing pt. Spoke with Dr. Nicole Kindred and he stated he had told Dr. Hal Hope that he only needed to see pt if neuro workup was positive. Carroll Kinds RN

## 2016-03-16 NOTE — ED Notes (Signed)
Attempted report 

## 2016-03-16 NOTE — Progress Notes (Signed)
Patient arrived to [phone] from emergency room. Currently has no admitting orders. Patient is stable, just complaining of rib pain on left side 2/10. Requesting tylenol. Paged MD Hal Hope to inform of patient's arrival to unit. Will continue to monitor for further orders.

## 2016-03-16 NOTE — H&P (Signed)
History and Physical    [name] [id number] DOB: 1950/08/10 DOA: 03/15/2016  PCP: Lilian Coma, MD  Patient coming from: Home.  Chief Complaint: Loss of consciousness.  HPI: [name] is a 65 y.o. male with Barrett's esophagus, hyperlipidemia, chronic cough was brought to the ER after patient had a brief episode of possible loss of consciousness. Patient states last evening around 6 PM he was at his gym in his apartment complex and was cycling and the next thing he remembers is sitting on a chair with multiple bruises. He does not recall what happened in between. His wife on seeing this brought him to the ER. CT of the head neck abdomen and chest were unremarkable. EKG initially showed possible Mobitz 1 second-degree AV block. Subsequent to that change to normal sinus rhythm. Patient was admitted in May of this year for involuntary left upper extremity movements cause of which was unclear. At that time as per the notes MRI of the brain and C-spine were unremarkable. Patient is being admitted for further workup for possible syncope. During the episode patient did have incontinence of urine. No tongue bite. Patient still has some pain around his left ribs though CAT scan does not show any fractures.   ED Course: Patient had CT scan of the head C-spine chest and abdomen which did not show anything acute. EKG was showing possible second-degree AV block.  Review of Systems: As per HPI, rest all negative.   Past Medical History:  Diagnosis Date  . Arthritis   . Bronchitis    history of  . 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 GERD (gastroesophageal reflux disease)   . Hiatal hernia   . 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 Hypercalcemia   . Lipoma    left forearm (3)  . Mitral valve prolapse    does not see cardiologist for. last stress test 2003  . Pneumonia 2009  . Tumor cells, benign    lung, left side    Past Surgical History:  Procedure Laterality Date  .  APPENDECTOMY  1984  . CATARACT EXTRACTION     L and R eye  . CHOLECYSTECTOMY  2002  . JOINT REPLACEMENT     Lt hip  . MASTOIDECTOMY  1996  . NASAL SEPTUM SURGERY     sinus surgery  . PARATHYROIDECTOMY    . TOTAL HIP REVISION  06/14/2011   Procedure: TOTAL HIP REVISION;  Surgeon: Kerin Salen;  Location: [place];  Service: Orthopedics;  Laterality: Left;  Left Acetabular  Hip Revision     reports that he quit smoking about 28 years ago. His smoking use included Cigarettes. He has a 36.00 pack-year smoking history. He has quit using smokeless tobacco. He reports that he drinks about 0.6 oz of alcohol per week . He reports that he does not use drugs.  Allergies  Allergen Reactions  . Ciprofloxacin Hcl Other (See Comments)    Burning in ear when drops were used  . Asa [Aspirin]     Upset stomach   . Gabapentin     Weakness and dizziness    Family History  Problem Relation Age of Onset  . Lung cancer Father 45    smoked  . Heart Problems Father   . Cancer Brother     prostate  . Heart Problems Mother     Prior to Admission medications   Medication Sig Start Date End Date Taking? Authorizing Provider  ALPRAZolam (XANAX) 0.5 MG tablet Take 1 tablet (0.5 mg total) by mouth at bedtime as needed for anxiety. 08/26/15  Yes Tanda Rockers, MD  buPROPion [facility] SR) 150 MG 12 hr tablet Take 150 mg by mouth 2 (two) times daily.   Yes Historical Provider, MD  cetirizine (ZYRTEC) 10 MG tablet Take 10 mg by mouth at bedtime.   Yes Historical Provider, MD  dextromethorphan-guaiFENesin (MUCINEX DM) 30-600 MG 12hr tablet Take 1 tablet by mouth daily as needed for cough. For sinus per patient   Yes Historical Provider, MD  diphenhydramine-acetaminophen (TYLENOL PM) 25-500 MG TABS tablet Take 1 tablet by mouth at bedtime as needed. For sleep   Yes Historical Provider, MD  docusate sodium (COLACE) 100 MG capsule Take 200 mg by mouth daily.   Yes Historical Provider, MD  famotidine (PEPCID) 20  MG tablet Take 1 tablet (20 mg total) by mouth at bedtime. 09/09/15  Yes Reyne Dumas, MD  [place] 2 each by mouth daily.   Yes Historical Provider, MD  FLUoxetine (PROZAC) 40 MG capsule Take 40 mg by mouth every morning.    Yes Historical Provider, MD  fluticasone (FLONASE) 50 MCG/ACT nasal spray Place 2 sprays into both nostrils 2 (two) times daily. Patient taking differently: Place 2 sprays into both nostrils daily.  09/09/15  Yes Reyne Dumas, MD  methylphenidate (RITALIN) 10 MG tablet Take 10 mg by mouth 3 (three) times daily.     Yes Historical Provider, MD  Multiple Vitamin (MULTIVITAMIN WITH MINERALS) TABS tablet Take 1 tablet by mouth daily.   Yes Historical Provider, MD  omeprazole (PRILOSEC) 20 MG capsule Take 40 mg by mouth 2 (two) times daily before a meal.    Yes Historical Provider, MD  polyethylene glycol (MIRALAX / GLYCOLAX) packet Take 17 g by mouth daily as needed for mild constipation. Use daily as directed as needed for constipation    Yes Historical Provider, MD  Probiotic Product (PROBIOTIC DAILY) CAPS Take 2 capsules by mouth every morning. Reported on 08/21/2015   Yes Historical Provider, MD  simvastatin (ZOCOR) 20 MG tablet Take 20 mg by mouth at bedtime.     Yes Historical Provider, MD  tamsulosin (FLOMAX) 0.4 MG CAPS capsule Take 0.4 mg by mouth daily after supper.   Yes Historical Provider, MD  dextromethorphan (DELSYM) 30 MG/5ML liquid Take 10 mLs by mouth every 12 (twelve) hours as needed for cough (with flutter valve).     Historical Provider, MD  Respiratory Therapy Supplies (FLUTTER) DEVI Use as directed 08/21/15   Tanda Rockers, MD    Physical Exam: Vitals:   03/15/16 2345 03/15/16 2355 03/16/16 0000 03/16/16 0045  BP: 112/71  111/74 116/66  Pulse: 79  73 73  Resp: 25  15   Temp:  98.4 F (36.9 C)  98.5 F (36.9 C)  TempSrc:    Oral  SpO2: 99%  100% 100%  Weight:    159 lb (72.1 kg)  Height:    6' (1.829 m)      Constitutional: Not  in distress. Vitals:   03/15/16 2345 03/15/16 2355 03/16/16 0000 03/16/16 0045  BP: 112/71  111/74 116/66  Pulse: 79  73 73  Resp: 25  15   Temp:  98.4 F (36.9 C)  98.5 F (36.9 C)  TempSrc:    Oral  SpO2: 99%  100% 100%  Weight:    159 lb (72.1 kg)  Height:    6' (1.829 m)  Eyes: Anicteric no pallor. ENMT: Bruise on the forehead. Neck: No neck rigidity no mass felt no JVD appreciated. Respiratory: No rhonchi or crepitations. Cardiovascular: S1 and S2 heard. Abdomen: Soft nontender bowel sounds present. No guarding or rigidity. Musculoskeletal: No edema. Skin: Multiple bruises. Neurologic: Alert awake oriented to time place and person. Moves all extremities 5 x 5. No facial asymmetry. Tongue was midline. PERRLA positive. Psychiatric: Appears normal.   Labs on Admission: I have personally reviewed following labs and imaging studies  CBC:  Recent Labs Lab 03/15/16 2039  WBC 12.2*  NEUTROABS 9.8*  HGB 14.9  HCT 44.2  MCV 90.8  PLT 123XX123   Basic Metabolic Panel:  Recent Labs Lab 03/15/16 2039  NA 141  K 4.2  CL 106  CO2 29  GLUCOSE 129*  BUN 21*  CREATININE 1.17  CALCIUM 9.2   GFR: Estimated Creatinine Clearance: 64.2 mL/min (by C-G formula based on SCr of 1.17 mg/dL). Liver Function Tests:  Recent Labs Lab 03/15/16 2039  AST 26  ALT 21  ALKPHOS 64  BILITOT 0.5  PROT 6.7  ALBUMIN 4.1   No results for input(s): LIPASE, AMYLASE in the last 168 hours. No results for input(s): AMMONIA in the last 168 hours. Coagulation Profile: No results for input(s): INR, PROTIME in the last 168 hours. Cardiac Enzymes: No results for input(s): CKTOTAL, CKMB, CKMBINDEX, TROPONINI in the last 168 hours. BNP (last 3 results) No results for input(s): PROBNP in the last 8760 hours. HbA1C: No results for input(s): HGBA1C in the last 72 hours. CBG:  Recent Labs Lab 03/15/16 2110  GLUCAP 136*   Lipid Profile: No results for input(s): CHOL, HDL, LDLCALC, TRIG,  CHOLHDL, LDLDIRECT in the last 72 hours. Thyroid Function Tests: No results for input(s): TSH, T4TOTAL, FREET4, T3FREE, THYROIDAB in the last 72 hours. Anemia Panel: No results for input(s): VITAMINB12, FOLATE, FERRITIN, TIBC, IRON, RETICCTPCT in the last 72 hours. Urine analysis:    Component Value Date/Time   COLORURINE YELLOW 03/15/2016 2034   APPEARANCEUR TURBID (A) 03/15/2016 2034   LABSPEC 1.037 (H) 03/15/2016 2034   PHURINE 5.5 03/15/2016 2034   GLUCOSEU NEGATIVE 03/15/2016 2034   HGBUR NEGATIVE 03/15/2016 2034   BILIRUBINUR NEGATIVE 03/15/2016 2034   KETONESUR NEGATIVE 03/15/2016 2034   PROTEINUR NEGATIVE 03/15/2016 2034   UROBILINOGEN 1.0 08/17/2012 2120   NITRITE NEGATIVE 03/15/2016 2034   LEUKOCYTESUR NEGATIVE 03/15/2016 2034   Sepsis Labs: @LABRCNTIP (procalcitonin:4,lacticidven:4) )No results found for this or any previous visit (from the past 240 hour(s)).   Radiological Exams on Admission: Ct Head Wo Contrast  Result Date: 03/15/2016 CLINICAL DATA:  Unwitnessed syncopal episode at the gym today. Forehead laceration. EXAM: CT HEAD WITHOUT CONTRAST CT CERVICAL SPINE WITHOUT CONTRAST TECHNIQUE: Multidetector CT imaging of the head and cervical spine was performed following the standard protocol without intravenous contrast. Multiplanar CT image reconstructions of the cervical spine were also generated. COMPARISON:  10/21/2015 FINDINGS: CT HEAD FINDINGS Brain: No evidence of acute infarction, hemorrhage, hydrocephalus, extra-axial collection or mass lesion/mass effect. Vascular: No hyperdense vessel or unexpected calcification. Skull: Normal. Negative for fracture or focal lesion. Sinuses/Orbits: No acute finding. CT CERVICAL SPINE FINDINGS Alignment: Normal Skull base and vertebrae: No acute fracture. No primary bone lesion or focal pathologic process. Soft tissues and spinal canal: No prevertebral fluid or swelling. No visible canal hematoma. Disc levels: Moderate degenerative  cervical disc disease, greatest at C5-6 and C6-7. Upper chest: No significant abnormality Other: IMPRESSION: 1. No acute intracranial findings.  Normal brain.  2. No acute cervical spine findings. Moderate cervical degenerative disc disease at C5-6 and C6-7. Electronically Signed   By: Andreas Newport M.D.   On: 03/15/2016 22:54   Ct Chest W Contrast  Result Date: 03/15/2016 CLINICAL DATA:  Unwitnessed syncopal episode at the gym today. Right-sided chest pain. EXAM: CT CHEST, ABDOMEN, AND PELVIS WITH CONTRAST TECHNIQUE: Multidetector CT imaging of the chest, abdomen and pelvis was performed following the standard protocol during bolus administration of intravenous contrast. CONTRAST:  1ooml ISOVUE-300 IOPAMIDOL (ISOVUE-300) INJECTION 61% COMPARISON:  08/18/2012 FINDINGS: CT CHEST FINDINGS Cardiovascular: The thoracic aorta is normal in caliber and intact. No acute intrathoracic vascular abnormality. Mediastinum/Nodes: Normal mediastinum and hila.  No adenopathy. Lungs/Pleura: There is a 5 x 6 mm nodule at the lateral periphery of the left lower lobe, unchanged from 08/18/2012. This is benign and does not require an additional evaluation. The lungs are otherwise clear except for minor scarring in the apices. Airways are patent. Musculoskeletal: No significant skeletal lesion. No fracture is evident. Moderate thoracic degenerative disc disease is present. CT ABDOMEN PELVIS FINDINGS Hepatobiliary: Cholecystectomy. The liver and bile ducts appear normal. Pancreas: Normal Spleen: Normal Adrenals/Urinary Tract: Benign simple renal cysts bilaterally, measuring up to 3.3 cm at the right upper pole and 6.2 cm at the left posterior midpole. The adrenals and kidneys are otherwise normal in appearance. There is no urinary calculus evident. There is no hydronephrosis or ureteral dilatation. Collecting systems and ureters appear unremarkable. The urinary bladder is unremarkable. Stomach/Bowel: Hiatal hernia, otherwise normal  appearances of the stomach, small bowel and colon. Prior appendectomy. Vascular/Lymphatic: The abdominal aorta is normal in caliber. There is no atherosclerotic calcification. There is no adenopathy in the abdomen or pelvis. Reproductive: Unremarkable Other: No acute findings are evident in the abdomen or pelvis. No ascites. Musculoskeletal: No significant skeletal lesion. Negative for acute fracture. IMPRESSION: 1. No acute findings are evident in the chest, abdomen or pelvis. 2. Benign 5 x 6 mm left lower lobe pulmonary nodule. This does not require additional evaluation. 3. Small hiatal hernia. Electronically Signed   By: Andreas Newport M.D.   On: 03/15/2016 23:06   Ct Cervical Spine Wo Contrast  Result Date: 03/15/2016 CLINICAL DATA:  Unwitnessed syncopal episode at the gym today. Forehead laceration. EXAM: CT HEAD WITHOUT CONTRAST CT CERVICAL SPINE WITHOUT CONTRAST TECHNIQUE: Multidetector CT imaging of the head and cervical spine was performed following the standard protocol without intravenous contrast. Multiplanar CT image reconstructions of the cervical spine were also generated. COMPARISON:  10/21/2015 FINDINGS: CT HEAD FINDINGS Brain: No evidence of acute infarction, hemorrhage, hydrocephalus, extra-axial collection or mass lesion/mass effect. Vascular: No hyperdense vessel or unexpected calcification. Skull: Normal. Negative for fracture or focal lesion. Sinuses/Orbits: No acute finding. CT CERVICAL SPINE FINDINGS Alignment: Normal Skull base and vertebrae: No acute fracture. No primary bone lesion or focal pathologic process. Soft tissues and spinal canal: No prevertebral fluid or swelling. No visible canal hematoma. Disc levels: Moderate degenerative cervical disc disease, greatest at C5-6 and C6-7. Upper chest: No significant abnormality Other: IMPRESSION: 1. No acute intracranial findings.  Normal brain. 2. No acute cervical spine findings. Moderate cervical degenerative disc disease at C5-6  and C6-7. Electronically Signed   By: Andreas Newport M.D.   On: 03/15/2016 22:54   Ct Abdomen Pelvis W Contrast  Result Date: 03/15/2016 CLINICAL DATA:  Unwitnessed syncopal episode at the gym today. Right-sided chest pain. EXAM: CT CHEST, ABDOMEN, AND PELVIS WITH CONTRAST TECHNIQUE: Multidetector CT imaging of the  chest, abdomen and pelvis was performed following the standard protocol during bolus administration of intravenous contrast. CONTRAST:  1ooml ISOVUE-300 IOPAMIDOL (ISOVUE-300) INJECTION 61% COMPARISON:  08/18/2012 FINDINGS: CT CHEST FINDINGS Cardiovascular: The thoracic aorta is normal in caliber and intact. No acute intrathoracic vascular abnormality. Mediastinum/Nodes: Normal mediastinum and hila.  No adenopathy. Lungs/Pleura: There is a 5 x 6 mm nodule at the lateral periphery of the left lower lobe, unchanged from 08/18/2012. This is benign and does not require an additional evaluation. The lungs are otherwise clear except for minor scarring in the apices. Airways are patent. Musculoskeletal: No significant skeletal lesion. No fracture is evident. Moderate thoracic degenerative disc disease is present. CT ABDOMEN PELVIS FINDINGS Hepatobiliary: Cholecystectomy. The liver and bile ducts appear normal. Pancreas: Normal Spleen: Normal Adrenals/Urinary Tract: Benign simple renal cysts bilaterally, measuring up to 3.3 cm at the right upper pole and 6.2 cm at the left posterior midpole. The adrenals and kidneys are otherwise normal in appearance. There is no urinary calculus evident. There is no hydronephrosis or ureteral dilatation. Collecting systems and ureters appear unremarkable. The urinary bladder is unremarkable. Stomach/Bowel: Hiatal hernia, otherwise normal appearances of the stomach, small bowel and colon. Prior appendectomy. Vascular/Lymphatic: The abdominal aorta is normal in caliber. There is no atherosclerotic calcification. There is no adenopathy in the abdomen or pelvis. Reproductive:  Unremarkable Other: No acute findings are evident in the abdomen or pelvis. No ascites. Musculoskeletal: No significant skeletal lesion. Negative for acute fracture. IMPRESSION: 1. No acute findings are evident in the chest, abdomen or pelvis. 2. Benign 5 x 6 mm left lower lobe pulmonary nodule. This does not require additional evaluation. 3. Small hiatal hernia. Electronically Signed   By: Andreas Newport M.D.   On: 03/15/2016 23:06    EKG: Independently reviewed. Mobitz 1 type II second-degree AV block.  Assessment/Plan Principal Problem:   Syncope Active Problems:   HLD (hyperlipidemia)   Mobitz type 1 second degree atrioventricular block    1. Syncope - cause not clear. Could be postictal. Given the history of previous admission for involuntary left upper and lower extremities movements and patient having incontinence of urine with trouble remembering the incident last evening seizures are under primary diagnosis and I did discuss with on-call neurologist Dr. Nicole Kindred who advised to get MRI of the brain and EEG. Patient's EKG was showing possible Mobitz 1 second-degree AV block. Will closely monitor in telemetry and may need to get opinion from cardiology in a.m. Check urine drug screen troponins. 2. History of Barrett's esophagus and GERD on PPI. 3. History of hyperlipidemia on statins. 4. History of hypercalcemia status post parathyroidectomy.   DVT prophylaxis: SCDs. Code Status: Full code.  Family Communication: Discussed with patient.  Disposition Plan: Home.  Consults called: Discussed with neurologist.  Admission status: Observation.    Rise Patience MD [facility] Pager [phone].  If 7PM-7AM, please contact night-coverage [email or web address] Password [facility]  03/16/2016, 2:47 AM

## 2016-03-16 NOTE — Care Management Obs Status (Signed)
[place] NOTIFICATION   Patient Details  Name: [name] MRN: [id number] Date of Birth: 02/19/1951   Medicare Observation Status Notification Given:  Yes    Bethena Roys, RN 03/16/2016, 4:40 PM

## 2016-03-16 NOTE — Progress Notes (Signed)
PT Cancellation Note  Patient Details Name: [name] MRN: [id number] DOB: [birth year]   Cancelled Treatment:    Reason Eval/Treat Not Completed: PT screened, no needs identified, will sign off. Spoke with OT, no needs   Duncan Dull 03/16/2016, 11:59 AM Alben Deeds, PT DPT  [phone]

## 2016-03-16 NOTE — Consult Note (Addendum)
CARDIOLOGY CONSULT NOTE   Patient ID: [name] MRN: [id number] DOB/AGE: [birth year] 65 y.o.  Admit date: 03/15/2016  Requesting Physician: Dr. Erlinda Hong Primary Physician:   Lilian Coma, MD Primary Cardiologist:   Dr. Cathie Olden Reason for Consultation:  syncope  HPI: [name] is a 65 y.o. male with a history of Barretts esophagitis, GERD with chronic cough, primary hyperparathyroidism s/p MI parathyroidectomy and HLD who presented to the [facility] ER on 03/15/16 after an episode of syncope.   He was seen once by Dr. Acie Fredrickson in 05/2015 for evaluation of chest pain. It was felt to be atypical and relieved with Carafate. An exercise stress test was ordered which was normal. He did not require long term follow up.   He was admitted and treated in the hospital in March for acute respiratory due to acute on chronic COPD exacerbation and complicated by community-acquired pneumonia.  He was treated with Bactrim, Levaquin, Rocephin, azithromycin, Tamiflu and then Keflex.  He was discharged with home PT. He did well but then started to exhibit intermittent confusion. One evening, he was at the movie theater and suddenly felt that he was part of the movie.  He also noted word-finding difficulty and trouble with spelling simple words and repeated questions often. When he would stand up, his left side would shake, first only his leg but then it progressed to his arm.  It only occured when he stood.  He underwent a thorough neurologic workup.  CBC and CMP were unremarkable for infection or metabolic abnormalities.  MRI of the brain showed mild chronic small vessel ischemic changes but no acute intracranial abnormality.    Patient was admitted in 11/2015 for involuntary left upper extremity movements cause of which was unclear. At that time as per the notes MRI of the brain and C-spine were unremarkable. He has followed up with Dr. Tomi Likens with neuro who recommended more testing and no clear explanation.  Follow up MRI of thoracic/lumbar spine showed no significant abnormality.   He was doing well until yesterday when he was exercising on a bicycle and had an episode of presumed syncope. His abnormal movement disorder had actually improved and he had been walking 2-5 miles a day. He decided he wanted to exercise at the gym and his wife took him to their apartment complex gym. His wife went to go pick him up and he was standing next to the bike looking very confused. When he got back to the apartment he sat in the wrong armchair and was acting odd. He removed his hat and his wife found abrasions on his head. After further discussion he felt as though he must have passed out on the bike. The last thing he remembers on the bike was feeling funny and then he woke up standing next to the bike. He also had urinary incontinence. He subsequently developed rib pain secondary to the fall. The patient continued to act odd causing wife concern and they presented to the [facility] ER. CT scans showed no acute injury. And lab workup was unremarkable. First EKG was thought to show possible second-degree AV block type I cardiology was consulted.   Of note over the past couple weeks the patient has complained of palpitations. The wife also said that his telemetry in the ER was beeping right before they transferred him to 3W and said his heart rate was in the 20s and 30s although I cannot find any evidence.  History of prior fainting  worked up at [facility] in [place]. No diagnosis was made. He did have nausea after this event.   Past Medical History:  Diagnosis Date  . Arthritis   . Bronchitis    history of  . 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 GERD (gastroesophageal reflux disease)   . Hiatal hernia   . 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 Hypercalcemia   . Lipoma    left forearm (3)  . Mitral valve prolapse    does not see cardiologist for. last stress test 2003  . Pneumonia 2009  . Tumor cells,  benign    lung, left side     Past Surgical History:  Procedure Laterality Date  . APPENDECTOMY  1984  . CATARACT EXTRACTION     L and R eye  . CHOLECYSTECTOMY  2002  . JOINT REPLACEMENT     Lt hip  . MASTOIDECTOMY  1996  . NASAL SEPTUM SURGERY     sinus surgery  . PARATHYROIDECTOMY    . TOTAL HIP REVISION  06/14/2011   Procedure: TOTAL HIP REVISION;  Surgeon: Kerin Salen;  Location: [place];  Service: Orthopedics;  Laterality: Left;  Left Acetabular  Hip Revision    Allergies  Allergen Reactions  . Ciprofloxacin Hcl Other (See Comments)    Burning in ear when drops were used  . Asa [Aspirin]     Upset stomach   . Gabapentin     Weakness and dizziness    I have reviewed the patient's current medications . buPROPion  150 mg Oral BID  . docusate sodium  200 mg Oral Daily  . famotidine  20 mg Oral QHS  . FLUoxetine  40 mg Oral Daily  . fluticasone  2 spray Each Nare Daily  . loratadine  10 mg Oral Daily  . methylphenidate  10 mg Oral TID WC  . multivitamin with minerals  1 tablet Oral Daily  . pantoprazole  40 mg Oral Daily  . polycarbophil  625 mg Oral Daily  . simvastatin  20 mg Oral QHS  . tamsulosin  0.4 mg Oral QPC supper   . sodium chloride 75 mL/hr at 03/16/16 0304   acetaminophen, ALPRAZolam, polyethylene glycol  Prior to Admission medications   Medication Sig Start Date End Date Taking? Authorizing Provider  ALPRAZolam Duanne Moron) 0.5 MG tablet Take 1 tablet (0.5 mg total) by mouth at bedtime as needed for anxiety. 08/26/15  Yes Tanda Rockers, MD  buPROPion [facility] SR) 150 MG 12 hr tablet Take 150 mg by mouth 2 (two) times daily.   Yes Historical Provider, MD  cetirizine (ZYRTEC) 10 MG tablet Take 10 mg by mouth at bedtime.   Yes Historical Provider, MD  dextromethorphan-guaiFENesin (MUCINEX DM) 30-600 MG 12hr tablet Take 1 tablet by mouth daily as needed for cough. For sinus per patient   Yes Historical Provider, MD  diphenhydramine-acetaminophen (TYLENOL  PM) 25-500 MG TABS tablet Take 1 tablet by mouth at bedtime as needed. For sleep   Yes Historical Provider, MD  docusate sodium (COLACE) 100 MG capsule Take 200 mg by mouth daily.   Yes Historical Provider, MD  famotidine (PEPCID) 20 MG tablet Take 1 tablet (20 mg total) by mouth at bedtime. 09/09/15  Yes Reyne Dumas, MD  [place] 2 each by mouth daily.   Yes Historical Provider, MD  FLUoxetine (PROZAC) 40 MG capsule Take 40 mg by mouth every morning.    Yes Historical  Provider, MD  fluticasone (FLONASE) 50 MCG/ACT nasal spray Place 2 sprays into both nostrils 2 (two) times daily. Patient taking differently: Place 2 sprays into both nostrils daily.  09/09/15  Yes Reyne Dumas, MD  methylphenidate (RITALIN) 10 MG tablet Take 10 mg by mouth 3 (three) times daily.     Yes Historical Provider, MD  Multiple Vitamin (MULTIVITAMIN WITH MINERALS) TABS tablet Take 1 tablet by mouth daily.   Yes Historical Provider, MD  omeprazole (PRILOSEC) 20 MG capsule Take 40 mg by mouth 2 (two) times daily before a meal.    Yes Historical Provider, MD  polyethylene glycol (MIRALAX / GLYCOLAX) packet Take 17 g by mouth daily as needed for mild constipation. Use daily as directed as needed for constipation    Yes Historical Provider, MD  Probiotic Product (PROBIOTIC DAILY) CAPS Take 2 capsules by mouth every morning. Reported on 08/21/2015   Yes Historical Provider, MD  simvastatin (ZOCOR) 20 MG tablet Take 20 mg by mouth at bedtime.     Yes Historical Provider, MD  tamsulosin (FLOMAX) 0.4 MG CAPS capsule Take 0.4 mg by mouth daily after supper.   Yes Historical Provider, MD  dextromethorphan (DELSYM) 30 MG/5ML liquid Take 10 mLs by mouth every 12 (twelve) hours as needed for cough (with flutter valve).     Historical Provider, MD  Respiratory Therapy Supplies (FLUTTER) DEVI Use as directed 08/21/15   Tanda Rockers, MD     Social History   Social History  . Marital status: Married    Spouse name:  Arville Go  . Number of children: 3  . Years of education: masters   Occupational History  .      Sales   Social History Main Topics  . Smoking status: Former Smoker    Packs/day: 1.50    Years: 24.00    Types: Cigarettes    Quit date: 07/06/1987  . Smokeless tobacco: Former Systems developer  . Alcohol use 0.6 oz/week    1 Cans of beer per week  . Drug use: No  . Sexual activity: Not on file   Other Topics Concern  . Not on file   Social History Narrative   Patient lives at home with his wife Arville Go) 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   Right handed.   Caffeine - one cup daily.    Family Status  Relation Status  . Father Deceased  . Brother Alive  . Mother Alive  . Sister Alive   neuropathy  . Brother Alive   Family History  Problem Relation Age of Onset  . Lung cancer Father 73    smoked  . Heart Problems Father   . Cancer Brother     prostate  . Heart Problems Mother     ROS:  Full 14 point review of systems complete and found to be negative unless listed above.  Physical Exam: Blood pressure 97/62, pulse 67, temperature 98.1 F (36.7 C), temperature source Oral, resp. rate 15, height 6' (1.829 m), weight 156 lb 14.4 oz (71.2 kg), SpO2 95 %.  General: Well developed, well nourished, male in no acute distress. Abrasion on forehead, right knee and ankle, and left shoulder. Head: Eyes PERRLA, No xanthomas.   Normocephalic and atraumatic, oropharynx without edema or exudate.   Lungs: CTAB. Tenderness in left anterior axillary line with palpation. Heart: HRRR S1 S2, no rub/gallop, Heart regular rate and rhythm with S1, S2  murmur. pulses are 2+ extrem.   Neck: No carotid  bruits. No lymphadenopathy.  NO JVD. Abdomen: Bowel sounds present, abdomen soft and non-tender without masses or hernias noted. Msk:  No spine or cva tenderness. No weakness, no joint deformities or effusions. Extremities: No clubbing or cyanosis.  No LE edema.  Neuro: Alert and oriented X 3. No  focal deficits noted. Psych:  Good affect, responds appropriately Skin: No rashes or lesions noted.  Labs:   Lab Results  Component Value Date   WBC 8.5 03/16/2016   HGB 13.4 03/16/2016   HCT 39.5 03/16/2016   MCV 90.6 03/16/2016   PLT 148 (L) 03/16/2016   No results for input(s): INR in the last 72 hours.  Recent Labs Lab 03/16/16 0329  NA 140  K 4.0  CL 106  CO2 27  BUN 17  CREATININE 1.11  CALCIUM 8.8*  PROT 5.9*  BILITOT 0.6  ALKPHOS 60  ALT 20  AST 24  GLUCOSE 96  ALBUMIN 3.5   Magnesium  Date Value Ref Range Status  08/18/2012 2.3 1.5 - 2.5 mg/dL Final    Recent Labs  03/16/16 0329 03/16/16 1115  CKTOTAL 296  --   TROPONINI <0.03 <0.03    Recent Labs  03/15/16 2054  TROPIPOC 0.00   No results found for: PROBNP Lab Results  Component Value Date   CHOL 189 10/21/2015   HDL 43 10/21/2015   LDLCALC 124 (H) 10/21/2015   TRIG 110 10/21/2015   No results found for: DDIMER Lipase  Date/Time Value Ref Range Status  08/17/2012 07:26 PM 16 11 - 59 U/L Final   TSH  Date/Time Value Ref Range Status  03/16/2016 03:29 AM 7.139 (H) 0.350 - 4.500 uIU/mL Final  10/27/2015 12:02 PM 4.71 (H) 0.35 - 4.50 uIU/mL Final   T3, Free  Date/Time Value Ref Range Status  10/27/2015 12:02 PM 2.8 2.3 - 4.2 pg/mL Final   Vitamin B-12  Date/Time Value Ref Range Status  10/27/2015 12:02 PM 588 211 - 911 pg/mL Final    Echo: 10/22/2015 LV EF: 55% -   60% Study Conclusions - Left ventricle: The cavity size was normal. Systolic function was   normal. The estimated ejection fraction was in the range of 55%   to 60%. Wall motion was normal; there were no regional wall   motion abnormalities. - Aortic valve: Trileaflet; mildly thickened, mildly calcified   leaflets. - Mitral valve: Poorly visualized. - Pulmonic valve: Poorly visualized.  ECG:  Normal sinus rhythm with PACs. HR 93  Radiology:  Ct Head Wo Contrast  Result Date: 03/15/2016 CLINICAL DATA:   Unwitnessed syncopal episode at the gym today. Forehead laceration. EXAM: CT HEAD WITHOUT CONTRAST CT CERVICAL SPINE WITHOUT CONTRAST TECHNIQUE: Multidetector CT imaging of the head and cervical spine was performed following the standard protocol without intravenous contrast. Multiplanar CT image reconstructions of the cervical spine were also generated. COMPARISON:  10/21/2015 FINDINGS: CT HEAD FINDINGS Brain: No evidence of acute infarction, hemorrhage, hydrocephalus, extra-axial collection or mass lesion/mass effect. Vascular: No hyperdense vessel or unexpected calcification. Skull: Normal. Negative for fracture or focal lesion. Sinuses/Orbits: No acute finding. CT CERVICAL SPINE FINDINGS Alignment: Normal Skull base and vertebrae: No acute fracture. No primary bone lesion or focal pathologic process. Soft tissues and spinal canal: No prevertebral fluid or swelling. No visible canal hematoma. Disc levels: Moderate degenerative cervical disc disease, greatest at C5-6 and C6-7. Upper chest: No significant abnormality Other: IMPRESSION: 1. No acute intracranial findings.  Normal brain. 2. No acute cervical spine findings. Moderate cervical degenerative  disc disease at C5-6 and C6-7. Electronically Signed   By: Andreas Newport M.D.   On: 03/15/2016 22:54   Ct Chest W Contrast  Result Date: 03/15/2016 CLINICAL DATA:  Unwitnessed syncopal episode at the gym today. Right-sided chest pain. EXAM: CT CHEST, ABDOMEN, AND PELVIS WITH CONTRAST TECHNIQUE: Multidetector CT imaging of the chest, abdomen and pelvis was performed following the standard protocol during bolus administration of intravenous contrast. CONTRAST:  1ooml ISOVUE-300 IOPAMIDOL (ISOVUE-300) INJECTION 61% COMPARISON:  08/18/2012 FINDINGS: CT CHEST FINDINGS Cardiovascular: The thoracic aorta is normal in caliber and intact. No acute intrathoracic vascular abnormality. Mediastinum/Nodes: Normal mediastinum and hila.  No adenopathy. Lungs/Pleura: There is  a 5 x 6 mm nodule at the lateral periphery of the left lower lobe, unchanged from 08/18/2012. This is benign and does not require an additional evaluation. The lungs are otherwise clear except for minor scarring in the apices. Airways are patent. Musculoskeletal: No significant skeletal lesion. No fracture is evident. Moderate thoracic degenerative disc disease is present. CT ABDOMEN PELVIS FINDINGS Hepatobiliary: Cholecystectomy. The liver and bile ducts appear normal. Pancreas: Normal Spleen: Normal Adrenals/Urinary Tract: Benign simple renal cysts bilaterally, measuring up to 3.3 cm at the right upper pole and 6.2 cm at the left posterior midpole. The adrenals and kidneys are otherwise normal in appearance. There is no urinary calculus evident. There is no hydronephrosis or ureteral dilatation. Collecting systems and ureters appear unremarkable. The urinary bladder is unremarkable. Stomach/Bowel: Hiatal hernia, otherwise normal appearances of the stomach, small bowel and colon. Prior appendectomy. Vascular/Lymphatic: The abdominal aorta is normal in caliber. There is no atherosclerotic calcification. There is no adenopathy in the abdomen or pelvis. Reproductive: Unremarkable Other: No acute findings are evident in the abdomen or pelvis. No ascites. Musculoskeletal: No significant skeletal lesion. Negative for acute fracture. IMPRESSION: 1. No acute findings are evident in the chest, abdomen or pelvis. 2. Benign 5 x 6 mm left lower lobe pulmonary nodule. This does not require additional evaluation. 3. Small hiatal hernia. Electronically Signed   By: Andreas Newport M.D.   On: 03/15/2016 23:06   Ct Cervical Spine Wo Contrast  Result Date: 03/15/2016 CLINICAL DATA:  Unwitnessed syncopal episode at the gym today. Forehead laceration. EXAM: CT HEAD WITHOUT CONTRAST CT CERVICAL SPINE WITHOUT CONTRAST TECHNIQUE: Multidetector CT imaging of the head and cervical spine was performed following the standard protocol  without intravenous contrast. Multiplanar CT image reconstructions of the cervical spine were also generated. COMPARISON:  10/21/2015 FINDINGS: CT HEAD FINDINGS Brain: No evidence of acute infarction, hemorrhage, hydrocephalus, extra-axial collection or mass lesion/mass effect. Vascular: No hyperdense vessel or unexpected calcification. Skull: Normal. Negative for fracture or focal lesion. Sinuses/Orbits: No acute finding. CT CERVICAL SPINE FINDINGS Alignment: Normal Skull base and vertebrae: No acute fracture. No primary bone lesion or focal pathologic process. Soft tissues and spinal canal: No prevertebral fluid or swelling. No visible canal hematoma. Disc levels: Moderate degenerative cervical disc disease, greatest at C5-6 and C6-7. Upper chest: No significant abnormality Other: IMPRESSION: 1. No acute intracranial findings.  Normal brain. 2. No acute cervical spine findings. Moderate cervical degenerative disc disease at C5-6 and C6-7. Electronically Signed   By: Andreas Newport M.D.   On: 03/15/2016 22:54   Mr Brain Wo Contrast  Result Date: 03/16/2016 CLINICAL DATA:  65 year old male with syncope while exercising. Initial encounter. EXAM: MRI HEAD WITHOUT CONTRAST TECHNIQUE: Multiplanar, multiecho pulse sequences of the brain and surrounding structures were obtained without intravenous contrast. COMPARISON:  Head and cervical  spine CT 03/15/2016. Brain MRI 10/21/2015. FINDINGS: Brain: No restricted diffusion to suggest acute infarction. No midline shift, mass effect, evidence of mass lesion, ventriculomegaly, extra-axial collection or acute intracranial hemorrhage. Cervicomedullary junction and pituitary are within normal limits. Stable gray and white matter signal throughout the brain. Scattered small nonspecific foci of cerebral white matter T2 and FLAIR hyperintensity, mild to moderate for age. No cortical encephalomalacia. No chronic cerebral blood products. Negative deep gray matter nuclei,  brainstem, and cerebellum. Vascular: Major intracranial vascular flow voids are stable with a degree of generalized intracranial artery dolichoectasia. Skull and upper cervical spine: Negative visualized cervical spine. Normal bone marrow signal. Sinuses/Orbits: Stable postoperative changes to the globes. Otherwise negative orbit and scalp soft tissues. Mild paranasal sinus mucosal thickening, sparing the sphenoid sinuses. Stable trace mastoid fluid. Other: Visible internal auditory structures appear normal. IMPRESSION: 1.  No acute intracranial abnormality. 2. Stable noncontrast MRI appearance of the brain since April with mild for age nonspecific white matter signal changes. 3. Mild paranasal sinus inflammation sparing the sphenoid sinuses. Electronically Signed   By: Genevie Ann M.D.   On: 03/16/2016 11:04   Ct Abdomen Pelvis W Contrast  Result Date: 03/15/2016 CLINICAL DATA:  Unwitnessed syncopal episode at the gym today. Right-sided chest pain. EXAM: CT CHEST, ABDOMEN, AND PELVIS WITH CONTRAST TECHNIQUE: Multidetector CT imaging of the chest, abdomen and pelvis was performed following the standard protocol during bolus administration of intravenous contrast. CONTRAST:  1ooml ISOVUE-300 IOPAMIDOL (ISOVUE-300) INJECTION 61% COMPARISON:  08/18/2012 FINDINGS: CT CHEST FINDINGS Cardiovascular: The thoracic aorta is normal in caliber and intact. No acute intrathoracic vascular abnormality. Mediastinum/Nodes: Normal mediastinum and hila.  No adenopathy. Lungs/Pleura: There is a 5 x 6 mm nodule at the lateral periphery of the left lower lobe, unchanged from 08/18/2012. This is benign and does not require an additional evaluation. The lungs are otherwise clear except for minor scarring in the apices. Airways are patent. Musculoskeletal: No significant skeletal lesion. No fracture is evident. Moderate thoracic degenerative disc disease is present. CT ABDOMEN PELVIS FINDINGS Hepatobiliary: Cholecystectomy. The liver and  bile ducts appear normal. Pancreas: Normal Spleen: Normal Adrenals/Urinary Tract: Benign simple renal cysts bilaterally, measuring up to 3.3 cm at the right upper pole and 6.2 cm at the left posterior midpole. The adrenals and kidneys are otherwise normal in appearance. There is no urinary calculus evident. There is no hydronephrosis or ureteral dilatation. Collecting systems and ureters appear unremarkable. The urinary bladder is unremarkable. Stomach/Bowel: Hiatal hernia, otherwise normal appearances of the stomach, small bowel and colon. Prior appendectomy. Vascular/Lymphatic: The abdominal aorta is normal in caliber. There is no atherosclerotic calcification. There is no adenopathy in the abdomen or pelvis. Reproductive: Unremarkable Other: No acute findings are evident in the abdomen or pelvis. No ascites. Musculoskeletal: No significant skeletal lesion. Negative for acute fracture. IMPRESSION: 1. No acute findings are evident in the chest, abdomen or pelvis. 2. Benign 5 x 6 mm left lower lobe pulmonary nodule. This does not require additional evaluation. 3. Small hiatal hernia. Electronically Signed   By: Andreas Newport M.D.   On: 03/15/2016 23:06   05/2015 Study Highlights   There was no ST segment deviation noted during stress.     ASSESSMENT AND PLAN:    Principal Problem:   Syncope Active Problems:   HLD (hyperlipidemia)   Mobitz type 1 second degree atrioventricular block  Syncope: after talking with his wife, this does not sound cardiac in nature, especially in light of all his unexplained  neurologic symptoms recently. He had urinary incontinence and post syncope confusion. His initial ECG was read as 2nd deg AV block Type I, but review of this shows NSR with PACs. There has been no evidence of conduction disease on ECG or telemetry. Troponin negative 2. However, over the past couple weeks the patient has complained of palpitations. The wife also said that his telemetry in the ER was  beeping right before they transferred him to [address] and said his heart rate was in the 20s and 30s although I cannot find any evidence of this. He may need an outpatient monitor arranged at discharge. MD to follow.     Signed: Angelena Form, PA-C 03/16/2016 2:48 PM  Pager [phone]  Co-Sign MD  The patient has been seen in conjunction with Nell Range, PAC. All aspects of care have been considered and discussed. The patient has been personally interviewed, examined, and all clinical data has been reviewed.   History sounds neurological. Has had prior fainting spells dating back to childhood. Had w/u at [facility] in [place] 5-7 years ago. Electrocardiogram simply reveals normal sinus rhythm with frequent PACs. No AV block is noted. I do not believe that the current presentation represents cardiac syncope due to the patient's confusion state post event which is likely post-ictal.  Workup for neurological etiology.  Consider 30 day monitor should no explanation for the presentation be identified.  Relatively recent echo was unremarkable.

## 2016-03-16 NOTE — Procedures (Signed)
ELECTROENCEPHALOGRAM REPORT  Date of Study: 03/16/2016  Patient's Name: [name] MRN: [id number] Date of Birth: 1951/06/21  Referring Provider: Dr. Florencia Reasons  Clinical History: This is a 65 year old man with a brief episode of possible loss of consciousness, woke up with abrasions and urinary incontinence.  Medications: acetaminophen (TYLENOL) tablet 650 mg  ALPRAZolam (XANAX) tablet 0.5 mg  buPROPion (WELLBUTRIN SR) 12 hr tablet 150 mg  docusate sodium (COLACE) capsule 200 mg  famotidine (PEPCID) tablet 20 mg  FLUoxetine (PROZAC) capsule 40 mg  fluticasone (FLONASE) 50 MCG/ACT nasal spray 2 spray  loratadine (CLARITIN) tablet 10 mg  methylphenidate (RITALIN) tablet 10 mg  multivitamin with minerals tablet 1 tablet  pantoprazole (PROTONIX) EC tablet 40 mg  polycarbophil (FIBERCON) tablet 625 mg  polyethylene glycol (MIRALAX / GLYCOLAX) packet 17 g  simvastatin (ZOCOR) tablet 20 mg  tamsulosin (FLOMAX) capsule 0.4 mg   Technical Summary: A multichannel digital EEG recording measured by the international 10-20 system with electrodes applied with paste and impedances below 5000 ohms performed in our laboratory with EKG monitoring in an awake and drowsy patient.  Photic stimulation was performed.  The digital EEG was referentially recorded, reformatted, and digitally filtered in a variety of bipolar and referential montages for optimal display.    Description: The patient is awake and drowsy during the recording.  During maximal wakefulness, there is a symmetric, medium voltage 10 Hz posterior dominant rhythm that attenuates with eye opening.  The record is symmetric.  During drowsiness, there is an increase in theta slowing of the background.Deeper stages of sleep were not seen. Photic stimulation did not elicit any abnormalities.  There were no epileptiform discharges or electrographic seizures seen.    EKG lead was unremarkable.  Impression: This awake and drowsy EEG is  normal.    Clinical Correlation: A normal EEG does not exclude a clinical diagnosis of epilepsy.  Clinical correlation is advised.   Ellouise Newer, M.D.

## 2016-03-16 NOTE — Progress Notes (Addendum)
Patient seen and examined, vital stable. Report feeling dizzy standing up, lab unremarkable, will get orthostatic vital signs, will check am cortisol level.  MRI brain no acute findings except mild sinusitis, admitting MD consulted neurology,  review home meds list patient has been on wellbutrin which could lower seizure threshold, patient report he has been on wellbutrin for many years for anxiety. Elevated tsh, check free t3/free t4,   I have called cardiology consult, patient report has palpitations, EKG concerning for second degree av block. Wife updated in room.

## 2016-03-16 NOTE — Progress Notes (Signed)
EEG Completed; Results Pending  

## 2016-03-16 NOTE — Evaluation (Signed)
Occupational Therapy Evaluation/Discharge Patient Details Name: [name] MRN: [id number] DOB: 1951-04-22 Today's Date: 03/16/2016    History of Present Illness 65 y.o. male admitted for syncope. 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   Clinical Impression   Patient has been evaluated by Occupational Therapy with no acute OT needs identified. Pt able to complete all ADLs, transfers and ambulation with modified independence. Pt reports 3 falls in past couple of years. Educated pt on home safety and fall prevention strategies to minimize his fall risk at home. All education has been completed and pt has no further questions. Pt with no further acute OT needs. OT signing off.    Follow Up Recommendations  No OT follow up    Equipment Recommendations  None recommended by OT    Recommendations for Other Services       Precautions / Restrictions Precautions Precautions: None Restrictions Weight Bearing Restrictions: No      Mobility Bed Mobility Overal bed mobility: Modified Independent             General bed mobility comments: Increased effort due to pain  Transfers Overall transfer level: Modified independent Equipment used: None                  Balance Overall balance assessment: History of Falls;No apparent balance deficits (not formally assessed)                                          ADL Overall ADL's : Modified independent                                       General ADL Comments: Educated on fall prevention and home safety.     Vision Vision Assessment?: No apparent visual deficits   Perception     Praxis      Pertinent Vitals/Pain Pain Assessment: Faces Faces Pain Scale: Hurts even more Pain Location: L chest and back around rib cage Pain Descriptors / Indicators: Aching;Tender Pain Intervention(s): Limited activity within  patient's tolerance;Monitored during session;Repositioned     Hand Dominance Right   Extremity/Trunk Assessment Upper Extremity Assessment Upper Extremity Assessment: Overall WFL for tasks assessed   Lower Extremity Assessment Lower Extremity Assessment: Overall WFL for tasks assessed   Cervical / Trunk Assessment Cervical / Trunk Assessment: Kyphotic   Communication Communication Communication: No difficulties   Cognition Arousal/Alertness: Awake/alert Behavior During Therapy: WFL for tasks assessed/performed Overall Cognitive Status: Within Functional Limits for tasks assessed                     General Comments       Exercises       Shoulder Instructions      Home Living Family/patient expects to be discharged to:: Private residence Living Arrangements: Spouse/significant other Available Help at Discharge: Family;Available 24 hours/day Type of Home: Apartment Home Access: Level entry     Home Layout: One level     Bathroom Shower/Tub: Tub/shower unit Shower/tub characteristics: Curtain Biochemist, clinical: Standard     Home Equipment: Environmental consultant - 2 wheels;Bedside commode;Shower seat;Grab bars - tub/shower   Additional Comments: Has DME from previous THA, but does not currently use it      Prior Functioning/Environment Level of  Independence: Independent        Comments: Pt reports 3 falls in last couple of years.     OT Diagnosis: Acute pain   OT Problem List: Pain   OT Treatment/Interventions:      OT Goals(Current goals can be found in the care plan section) Acute Rehab OT Goals Patient Stated Goal: to go home OT Goal Formulation: With patient Time For Goal Achievement: 03/30/16 Potential to Achieve Goals: Good  OT Frequency:     Barriers to D/C:            Co-evaluation              End of Session Nurse Communication: Mobility status  Activity Tolerance: Patient tolerated treatment well Patient left: in chair;with call  bell/phone within reach   Time: [id number] OT Time Calculation (min): 18 min Charges:  OT General Charges $OT Visit: 1 Procedure OT Evaluation $OT Eval Low Complexity: 1 Procedure G-Codes: OT G-codes **NOT FOR INPATIENT CLASS** Functional Assessment Tool Used: clinical judgement Functional Limitation: Self care Self Care Current Status [id number]): 0 percent impaired, limited or restricted Self Care Goal Status [id number]): 0 percent impaired, limited or restricted Self Care Discharge Status [id number]): 0 percent impaired, limited or restricted  Redmond Baseman, OTR/L Pager: [phone] 03/16/2016, 12:07 PM

## 2016-03-17 ENCOUNTER — Other Ambulatory Visit: Payer: Self-pay | Admitting: Physician Assistant

## 2016-03-17 ENCOUNTER — Observation Stay (HOSPITAL_BASED_OUTPATIENT_CLINIC_OR_DEPARTMENT_OTHER): Payer: Medicare Other

## 2016-03-17 DIAGNOSIS — S20212A Contusion of left front wall of thorax, initial encounter: Secondary | ICD-10-CM | POA: Diagnosis not present

## 2016-03-17 DIAGNOSIS — R55 Syncope and collapse: Secondary | ICD-10-CM

## 2016-03-17 LAB — CBC
HCT: 37.8 % — ABNORMAL LOW (ref 39.0–52.0)
Hemoglobin: 12.5 g/dL — ABNORMAL LOW (ref 13.0–17.0)
MCH: 30.1 pg (ref 26.0–34.0)
MCHC: 33.1 g/dL (ref 30.0–36.0)
MCV: 91.1 fL (ref 78.0–100.0)
PLATELETS: 142 10*3/uL — AB (ref 150–400)
RBC: 4.15 MIL/uL — AB (ref 4.22–5.81)
RDW: 13 % (ref 11.5–15.5)
WBC: 6.3 10*3/uL (ref 4.0–10.5)

## 2016-03-17 LAB — BASIC METABOLIC PANEL
Anion gap: 6 (ref 5–15)
BUN: 15 mg/dL (ref 6–20)
CALCIUM: 8.4 mg/dL — AB (ref 8.9–10.3)
CO2: 27 mmol/L (ref 22–32)
Chloride: 107 mmol/L (ref 101–111)
Creatinine, Ser: 0.98 mg/dL (ref 0.61–1.24)
Glucose, Bld: 92 mg/dL (ref 65–99)
POTASSIUM: 3.7 mmol/L (ref 3.5–5.1)
SODIUM: 140 mmol/L (ref 135–145)

## 2016-03-17 LAB — CORTISOL: CORTISOL PLASMA: 7.9 ug/dL

## 2016-03-17 LAB — ECHOCARDIOGRAM COMPLETE
Height: 72 in
WEIGHTICAEL: 2528 [oz_av]

## 2016-03-17 LAB — T4, FREE: FREE T4: 0.83 ng/dL (ref 0.61–1.12)

## 2016-03-17 NOTE — Consult Note (Signed)
NEURO HOSPITALIST CONSULT NOTE   Requestig physician: Dr. Sloan Leiter   Reason for Consult: Syncope versus seizure   History obtained from:  Patient     HPI:                                                                                                                                          [name] is an 65 y.o. male who states that he has given up drinking 11 years ago, takes 1.5 mg Xanax every night but has not missed any doses, and generally has been feeling well. Patient was on a stationary bike and an exercise room this linked to his apartment. He was feeling generally well when suddenly he felt a "weird feeling in his head". The next thing he recalls is being in a seated position on a seated stationary bike. He feels that he was out of it for a good "20 minutes". Patient does have bruising throughout the top of his head. Patient denies any episodes like this in the past. He states that when he was a child he would play soccer or exercise to the point of fainting, but he would quickly faint and have no postictal period. Patient denies having any abnormal movements as a child or brushing his teeth or when he woke up. Patient states he is a normal birth, has had no febrile seizures, no seizure history.  Patient is on Wellbutrin and has had no side effects from this medication and multiple years. He states that the dose was increased a while ago but was backed down to 150 mg daily last March. Again he said that no side effects with this medication.  MRI and EEG have been obtained and show no intracranial abnormality or epileptiform activity.  Of note patient was in the hospital on 10/21/2015 for abnormal limb movement. It is described as " He has felt somewhat confused intermittently, his speech is faulty and when he stands his left side shakes. Started with the leg but earlier had a similar spell in the arm. This only happens when he is standing. The shaking can be  very violent. It goes away when he changes position but on exam can elicit by forcing the patient to exert with the left leg." At that time he was seen by Dr. Leonie Man and  The brain and cervical spine imaging does not reveal significant pathology to explain his symptoms. Per Dr. Georgie Chard notes patient was referred to neuropsych as this likely was possible conversion disorder. She has not had these movements since that time.  Past Medical History:  Diagnosis Date  . Arthritis   . Bronchitis    history of  . 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 GERD (gastroesophageal reflux disease)   .  Hiatal hernia   . 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 Hypercalcemia   . Lipoma    left forearm (3)  . Mitral valve prolapse    does not see cardiologist for. last stress test 2003  . Pneumonia 2009  . Tumor cells, benign    lung, left side    Past Surgical History:  Procedure Laterality Date  . APPENDECTOMY  1984  . CATARACT EXTRACTION     L and R eye  . CHOLECYSTECTOMY  2002  . JOINT REPLACEMENT     Lt hip  . MASTOIDECTOMY  1996  . NASAL SEPTUM SURGERY     sinus surgery  . PARATHYROIDECTOMY    . TOTAL HIP REVISION  06/14/2011   Procedure: TOTAL HIP REVISION;  Surgeon: Kerin Salen;  Location: [facility];  Service: Orthopedics;  Laterality: Left;  Left Acetabular  Hip Revision    Family History  Problem Relation Age of Onset  . Lung cancer Father 70    smoked  . Heart Problems Father   . Cancer Brother     prostate  . Heart Problems Mother      Social History:  reports that he quit smoking about 28 years ago. His smoking use included Cigarettes. He has a 36.00 pack-year smoking history. He has quit using smokeless tobacco. He reports that he drinks about 0.6 oz of alcohol per week . He reports that he does not use drugs.  Allergies  Allergen Reactions  . Ciprofloxacin Hcl Other (See Comments)    Burning in ear when drops were used  . Asa [Aspirin]     Upset stomach   . Gabapentin      Weakness and dizziness    MEDICATIONS:                                                                                                                     Prior to Admission:  Prescriptions Prior to Admission  Medication Sig Dispense Refill Last Dose  . ALPRAZolam (XANAX) 0.5 MG tablet Take 1 tablet (0.5 mg total) by mouth at bedtime as needed for anxiety. 30 tablet 0 Past Week at Unknown time  . buPROPion (WELLBUTRIN SR) 150 MG 12 hr tablet Take 150 mg by mouth 2 (two) times daily.   03/15/2016 at Unknown time  . cetirizine (ZYRTEC) 10 MG tablet Take 10 mg by mouth at bedtime.   Past Week at Unknown time  . dextromethorphan-guaiFENesin (MUCINEX DM) 30-600 MG 12hr tablet Take 1 tablet by mouth daily as needed for cough. For sinus per patient   03/14/2016 at Unknown time  . diphenhydramine-acetaminophen (TYLENOL PM) 25-500 MG TABS tablet Take 1 tablet by mouth at bedtime as needed. For sleep   03/14/2016 at Unknown time  . docusate sodium (COLACE) 100 MG capsule Take 200 mg by mouth daily.   03/15/2016 at Unknown time  . famotidine (PEPCID) 20 MG tablet Take 1 tablet (20 mg total) by mouth at bedtime. 30 tablet 3  03/14/2016 at Unknown time  . FIBER SELECT GUMMIES CHEW Chew 2 each by mouth daily.   Past Week at Unknown time  . FLUoxetine (PROZAC) 40 MG capsule Take 40 mg by mouth every morning.    03/15/2016 at Unknown time  . fluticasone (FLONASE) 50 MCG/ACT nasal spray Place 2 sprays into both nostrils 2 (two) times daily. (Patient taking differently: Place 2 sprays into both nostrils daily. ) 16 g 2 Past Month at Unknown time  . methylphenidate (RITALIN) 10 MG tablet Take 10 mg by mouth 3 (three) times daily.     03/15/2016 at Unknown time  . Multiple Vitamin (MULTIVITAMIN WITH MINERALS) TABS tablet Take 1 tablet by mouth daily.   03/15/2016 at Unknown time  . omeprazole (PRILOSEC) 20 MG capsule Take 40 mg by mouth 2 (two) times daily before a meal.    03/15/2016 at Unknown time  . polyethylene  glycol (MIRALAX / GLYCOLAX) packet Take 17 g by mouth daily as needed for mild constipation. Use daily as directed as needed for constipation    Past Week at Unknown time  . Probiotic Product (PROBIOTIC DAILY) CAPS Take 2 capsules by mouth every morning. Reported on 08/21/2015   03/15/2016 at Unknown time  . simvastatin (ZOCOR) 20 MG tablet Take 20 mg by mouth at bedtime.     03/14/2016 at Unknown time  . tamsulosin (FLOMAX) 0.4 MG CAPS capsule Take 0.4 mg by mouth daily after supper.   03/15/2016 at Unknown time  . dextromethorphan (DELSYM) 30 MG/5ML liquid Take 10 mLs by mouth every 12 (twelve) hours as needed for cough (with flutter valve).    Not Taking at Unknown time  . Respiratory Therapy Supplies (FLUTTER) DEVI Use as directed 1 each 0 Taking   Scheduled: . buPROPion  150 mg Oral BID  . docusate sodium  200 mg Oral Daily  . famotidine  20 mg Oral QHS  . FLUoxetine  40 mg Oral Daily  . fluticasone  2 spray Each Nare Daily  . guaiFENesin  600 mg Oral BID  . loratadine  10 mg Oral Daily  . methylphenidate  10 mg Oral TID WC  . multivitamin with minerals  1 tablet Oral Daily  . pantoprazole  40 mg Oral Daily  . polycarbophil  625 mg Oral Daily  . simvastatin  20 mg Oral QHS  . tamsulosin  0.4 mg Oral QPC supper     ROS:                                                                                                                                       History obtained from the patient  General ROS: negative for - chills, fatigue, fever, night sweats, weight gain or weight loss Psychological ROS: negative for - behavioral disorder, hallucinations, memory difficulties, mood swings or suicidal ideation Ophthalmic ROS: negative for - blurry vision, double vision, eye pain or loss  of vision ENT ROS: negative for - epistaxis, nasal discharge, oral lesions, sore throat, tinnitus or vertigo Allergy and Immunology ROS: negative for - hives or itchy/watery eyes Hematological and Lymphatic ROS:  negative for - bleeding problems, bruising or swollen lymph nodes Endocrine ROS: negative for - galactorrhea, hair pattern changes, polydipsia/polyuria or temperature intolerance Respiratory ROS: negative for - cough, hemoptysis, shortness of breath or wheezing Cardiovascular ROS: negative for - chest pain, dyspnea on exertion, edema or irregular heartbeat Gastrointestinal ROS: negative for - abdominal pain, diarrhea, hematemesis, nausea/vomiting or stool incontinence Genito-Urinary ROS: negative for - dysuria, hematuria, incontinence or urinary frequency/urgency Musculoskeletal ROS: negative for - joint swelling or muscular weakness Neurological ROS: as noted in HPI Dermatological ROS: negative for rash and skin lesion changes   Blood pressure (!) 107/56, pulse 67, temperature 98.5 F (36.9 C), temperature source Oral, resp. rate 15, height 6' (1.829 m), weight 71.7 kg (158 lb), SpO2 98 %.   Neurologic Examination:                                                                                                      HEENT-  Normocephalic, no lesions, without obvious abnormality.  Normal external eye and conjunctiva.  Normal TM's bilaterally.  Normal auditory canals and external ears. Normal external nose, mucus membranes and septum.  Normal pharynx. Cardiovascular- S1, S2 normal, pulses palpable throughout   Lungs- chest clear, no wheezing, rales, normal symmetric air entry Abdomen- normal findings: bowel sounds normal Extremities- no edema Lymph-no adenopathy palpable Musculoskeletal-no joint tenderness, deformity or swelling Skin-warm and dry, no hyperpigmentation, vitiligo, or suspicious lesions  Neurological Examination Mental Status: Alert, oriented, thought content appropriate.  Speech fluent without evidence of aphasia.  Able to follow 3 step commands without difficulty. Cranial Nerves: II:  Visual fields grossly normal in the right eye and decreased in the left eye due to  cataract.  pupils anisacoric, round, reactive to light and accommodation III,IV, VI: ptosis not present, extra-ocular motions intact bilaterally V,VII: smile symmetric, facial light touch sensation normal bilaterally VIII: hearing normal bilaterally IX,X: uvula rises symmetrically XI: bilateral shoulder shrug XII: midline tongue extension Motor: Right : Upper extremity   5/5    Left:     Upper extremity   5/5  Lower extremity   4/5     Lower extremity   5/5 Tone and bulk:normal tone throughout; no atrophy noted Sensory: Pinprick and light touch intact throughout, bilaterally Deep Tendon Reflexes: 2+ and symmetric throughout Plantars: Right: downgoing   Left: downgoing Cerebellar: normal finger-to-nose, normal rapid alternating movements and normal heel-to-shin test Gait: Not tested      Lab Results: Basic Metabolic Panel:  Recent Labs Lab 03/15/16 2039 03/16/16 0329 03/17/16 0445  NA 141 140 140  K 4.2 4.0 3.7  CL 106 106 107  CO2 29 27 27   GLUCOSE 129* 96 92  BUN 21* 17 15  CREATININE 1.17 1.11 0.98  CALCIUM 9.2 8.8* 8.4*    Liver Function Tests:  Recent Labs Lab 03/15/16 2039 03/16/16 0329  AST 26 24  ALT 21  20  ALKPHOS 64 60  BILITOT 0.5 0.6  PROT 6.7 5.9*  ALBUMIN 4.1 3.5   No results for input(s): LIPASE, AMYLASE in the last 168 hours. No results for input(s): AMMONIA in the last 168 hours.  CBC:  Recent Labs Lab 03/15/16 2039 03/16/16 0329 03/17/16 0445  WBC 12.2* 8.5 6.3  NEUTROABS 9.8*  --   --   HGB 14.9 13.4 12.5*  HCT 44.2 39.5 37.8*  MCV 90.8 90.6 91.1  PLT 185 148* 142*    Cardiac Enzymes:  Recent Labs Lab 03/16/16 0329 03/16/16 1115 03/16/16 1628  CKTOTAL 296  --   --   TROPONINI <0.03 <0.03 <0.03    Lipid Panel: No results for input(s): CHOL, TRIG, HDL, CHOLHDL, VLDL, LDLCALC in the last 168 hours.  CBG:  Recent Labs Lab 03/15/16 2110  GLUCAP 136*    Microbiology: Results for orders placed or performed during  the hospital encounter of 09/04/15  Respiratory virus panel     Status: Abnormal   Collection Time: 09/04/15  1:53 PM  Result Value Ref Range Status   Source - RVPAN NASOPHARYNGEAL  Corrected   Respiratory Syncytial Virus A Negative Negative Final   Respiratory Syncytial Virus B Negative Negative Final   Influenza A Positive (A) Negative Final    Comment: Subtype: H3   Influenza B Negative Negative Final   Parainfluenza 1 Negative Negative Final   Parainfluenza 2 Negative Negative Final   Parainfluenza 3 Negative Negative Final   Metapneumovirus Negative Negative Final   Rhinovirus Negative Negative Final   Adenovirus Negative Negative Final    Comment: (NOTE) Performed At: [facility] [address] [place], [place] [id number] Lindon Romp MD [id number]   Culture, blood (Routine X 2) w Reflex to ID Panel     Status: None   Collection Time: 09/04/15  3:25 PM  Result Value Ref Range Status   Specimen Description BLOOD LEFT ANTECUBITAL  Final   Special Requests BOTTLES DRAWN AEROBIC AND ANAEROBIC 5CC  Final   Culture NO GROWTH 5 DAYS  Final   Report Status 09/09/2015 FINAL  Final  Culture, blood (Routine X 2) w Reflex to ID Panel     Status: None   Collection Time: 09/04/15  3:32 PM  Result Value Ref Range Status   Specimen Description BLOOD LEFT HAND  Final   Special Requests BOTTLES DRAWN AEROBIC ONLY [place]  Final   Culture NO GROWTH 5 DAYS  Final   Report Status 09/09/2015 FINAL  Final  Culture, sputum-assessment     Status: None   Collection Time: 09/05/15  7:38 PM  Result Value Ref Range Status   Specimen Description SPUTUM  Final   Special Requests NONE  Final   Sputum evaluation   Final    MICROSCOPIC FINDINGS SUGGEST THAT THIS SPECIMEN IS NOT REPRESENTATIVE OF LOWER RESPIRATORY SECRETIONS. PLEASE RECOLLECT. Louis Matte RN [id number] 09/05/15 A BROWNING    Report Status 09/05/2015 FINAL  Final    Coagulation Studies: No results for input(s): LABPROT,  INR in the last 72 hours.  Imaging: Ct Head Wo Contrast  Result Date: 03/15/2016 CLINICAL DATA:  Unwitnessed syncopal episode at the gym today. Forehead laceration. EXAM: CT HEAD WITHOUT CONTRAST CT CERVICAL SPINE WITHOUT CONTRAST TECHNIQUE: Multidetector CT imaging of the head and cervical spine was performed following the standard protocol without intravenous contrast. Multiplanar CT image reconstructions of the cervical spine were also generated. COMPARISON:  10/21/2015 FINDINGS: CT HEAD FINDINGS Brain: No evidence  of acute infarction, hemorrhage, hydrocephalus, extra-axial collection or mass lesion/mass effect. Vascular: No hyperdense vessel or unexpected calcification. Skull: Normal. Negative for fracture or focal lesion. Sinuses/Orbits: No acute finding. CT CERVICAL SPINE FINDINGS Alignment: Normal Skull base and vertebrae: No acute fracture. No primary bone lesion or focal pathologic process. Soft tissues and spinal canal: No prevertebral fluid or swelling. No visible canal hematoma. Disc levels: Moderate degenerative cervical disc disease, greatest at C5-6 and C6-7. Upper chest: No significant abnormality Other: IMPRESSION: 1. No acute intracranial findings.  Normal brain. 2. No acute cervical spine findings. Moderate cervical degenerative disc disease at C5-6 and C6-7. Electronically Signed   By: Andreas Newport M.D.   On: 03/15/2016 22:54   Ct Chest W Contrast  Result Date: 03/15/2016 CLINICAL DATA:  Unwitnessed syncopal episode at the gym today. Right-sided chest pain. EXAM: CT CHEST, ABDOMEN, AND PELVIS WITH CONTRAST TECHNIQUE: Multidetector CT imaging of the chest, abdomen and pelvis was performed following the standard protocol during bolus administration of intravenous contrast. CONTRAST:  1ooml ISOVUE-300 IOPAMIDOL (ISOVUE-300) INJECTION 61% COMPARISON:  08/18/2012 FINDINGS: CT CHEST FINDINGS Cardiovascular: The thoracic aorta is normal in caliber and intact. No acute intrathoracic  vascular abnormality. Mediastinum/Nodes: Normal mediastinum and hila.  No adenopathy. Lungs/Pleura: There is a 5 x 6 mm nodule at the lateral periphery of the left lower lobe, unchanged from 08/18/2012. This is benign and does not require an additional evaluation. The lungs are otherwise clear except for minor scarring in the apices. Airways are patent. Musculoskeletal: No significant skeletal lesion. No fracture is evident. Moderate thoracic degenerative disc disease is present. CT ABDOMEN PELVIS FINDINGS Hepatobiliary: Cholecystectomy. The liver and bile ducts appear normal. Pancreas: Normal Spleen: Normal Adrenals/Urinary Tract: Benign simple renal cysts bilaterally, measuring up to 3.3 cm at the right upper pole and 6.2 cm at the left posterior midpole. The adrenals and kidneys are otherwise normal in appearance. There is no urinary calculus evident. There is no hydronephrosis or ureteral dilatation. Collecting systems and ureters appear unremarkable. The urinary bladder is unremarkable. Stomach/Bowel: Hiatal hernia, otherwise normal appearances of the stomach, small bowel and colon. Prior appendectomy. Vascular/Lymphatic: The abdominal aorta is normal in caliber. There is no atherosclerotic calcification. There is no adenopathy in the abdomen or pelvis. Reproductive: Unremarkable Other: No acute findings are evident in the abdomen or pelvis. No ascites. Musculoskeletal: No significant skeletal lesion. Negative for acute fracture. IMPRESSION: 1. No acute findings are evident in the chest, abdomen or pelvis. 2. Benign 5 x 6 mm left lower lobe pulmonary nodule. This does not require additional evaluation. 3. Small hiatal hernia. Electronically Signed   By: Andreas Newport M.D.   On: 03/15/2016 23:06   Ct Cervical Spine Wo Contrast  Result Date: 03/15/2016 CLINICAL DATA:  Unwitnessed syncopal episode at the gym today. Forehead laceration. EXAM: CT HEAD WITHOUT CONTRAST CT CERVICAL SPINE WITHOUT CONTRAST  TECHNIQUE: Multidetector CT imaging of the head and cervical spine was performed following the standard protocol without intravenous contrast. Multiplanar CT image reconstructions of the cervical spine were also generated. COMPARISON:  10/21/2015 FINDINGS: CT HEAD FINDINGS Brain: No evidence of acute infarction, hemorrhage, hydrocephalus, extra-axial collection or mass lesion/mass effect. Vascular: No hyperdense vessel or unexpected calcification. Skull: Normal. Negative for fracture or focal lesion. Sinuses/Orbits: No acute finding. CT CERVICAL SPINE FINDINGS Alignment: Normal Skull base and vertebrae: No acute fracture. No primary bone lesion or focal pathologic process. Soft tissues and spinal canal: No prevertebral fluid or swelling. No visible canal hematoma. Disc levels:  Moderate degenerative cervical disc disease, greatest at C5-6 and C6-7. Upper chest: No significant abnormality Other: IMPRESSION: 1. No acute intracranial findings.  Normal brain. 2. No acute cervical spine findings. Moderate cervical degenerative disc disease at C5-6 and C6-7. Electronically Signed   By: Andreas Newport M.D.   On: 03/15/2016 22:54   Mr Brain Wo Contrast  Result Date: 03/16/2016 CLINICAL DATA:  65 year old male with syncope while exercising. Initial encounter. EXAM: MRI HEAD WITHOUT CONTRAST TECHNIQUE: Multiplanar, multiecho pulse sequences of the brain and surrounding structures were obtained without intravenous contrast. COMPARISON:  Head and cervical spine CT 03/15/2016. Brain MRI 10/21/2015. FINDINGS: Brain: No restricted diffusion to suggest acute infarction. No midline shift, mass effect, evidence of mass lesion, ventriculomegaly, extra-axial collection or acute intracranial hemorrhage. Cervicomedullary junction and pituitary are within normal limits. Stable gray and white matter signal throughout the brain. Scattered small nonspecific foci of cerebral white matter T2 and FLAIR hyperintensity, mild to moderate for  age. No cortical encephalomalacia. No chronic cerebral blood products. Negative deep gray matter nuclei, brainstem, and cerebellum. Vascular: Major intracranial vascular flow voids are stable with a degree of generalized intracranial artery dolichoectasia. Skull and upper cervical spine: Negative visualized cervical spine. Normal bone marrow signal. Sinuses/Orbits: Stable postoperative changes to the globes. Otherwise negative orbit and scalp soft tissues. Mild paranasal sinus mucosal thickening, sparing the sphenoid sinuses. Stable trace mastoid fluid. Other: Visible internal auditory structures appear normal. IMPRESSION: 1.  No acute intracranial abnormality. 2. Stable noncontrast MRI appearance of the brain since April with mild for age nonspecific white matter signal changes. 3. Mild paranasal sinus inflammation sparing the sphenoid sinuses. Electronically Signed   By: Genevie Ann M.D.   On: 03/16/2016 11:04   Ct Abdomen Pelvis W Contrast  Result Date: 03/15/2016 CLINICAL DATA:  Unwitnessed syncopal episode at the gym today. Right-sided chest pain. EXAM: CT CHEST, ABDOMEN, AND PELVIS WITH CONTRAST TECHNIQUE: Multidetector CT imaging of the chest, abdomen and pelvis was performed following the standard protocol during bolus administration of intravenous contrast. CONTRAST:  1ooml ISOVUE-300 IOPAMIDOL (ISOVUE-300) INJECTION 61% COMPARISON:  08/18/2012 FINDINGS: CT CHEST FINDINGS Cardiovascular: The thoracic aorta is normal in caliber and intact. No acute intrathoracic vascular abnormality. Mediastinum/Nodes: Normal mediastinum and hila.  No adenopathy. Lungs/Pleura: There is a 5 x 6 mm nodule at the lateral periphery of the left lower lobe, unchanged from 08/18/2012. This is benign and does not require an additional evaluation. The lungs are otherwise clear except for minor scarring in the apices. Airways are patent. Musculoskeletal: No significant skeletal lesion. No fracture is evident. Moderate thoracic  degenerative disc disease is present. CT ABDOMEN PELVIS FINDINGS Hepatobiliary: Cholecystectomy. The liver and bile ducts appear normal. Pancreas: Normal Spleen: Normal Adrenals/Urinary Tract: Benign simple renal cysts bilaterally, measuring up to 3.3 cm at the right upper pole and 6.2 cm at the left posterior midpole. The adrenals and kidneys are otherwise normal in appearance. There is no urinary calculus evident. There is no hydronephrosis or ureteral dilatation. Collecting systems and ureters appear unremarkable. The urinary bladder is unremarkable. Stomach/Bowel: Hiatal hernia, otherwise normal appearances of the stomach, small bowel and colon. Prior appendectomy. Vascular/Lymphatic: The abdominal aorta is normal in caliber. There is no atherosclerotic calcification. There is no adenopathy in the abdomen or pelvis. Reproductive: Unremarkable Other: No acute findings are evident in the abdomen or pelvis. No ascites. Musculoskeletal: No significant skeletal lesion. Negative for acute fracture. IMPRESSION: 1. No acute findings are evident in the chest, abdomen or pelvis. 2.  Benign 5 x 6 mm left lower lobe pulmonary nodule. This does not require additional evaluation. 3. Small hiatal hernia. Electronically Signed   By: Andreas Newport M.D.   On: 03/15/2016 23:06       Assessment and plan per attending neurologist  Etta Quill PA-C [facility] [phone]  03/17/2016, 10:07 AM   Assessment/Plan: This is a 65 year old male with one episode to which patient blacked out and had a confusion. For approximately 20 minutes. There are no bystanders, thus is not clear if patient had any rhythmic shaking at that time. MRI and EEG while in the hospital have not shown any intracranial abnormalities or epileptiform activity. Patient did have orthostatic blood pressures which were positive however cardiology has seen the patient and does not feel that it is cardiogenic. Patient is on Wellbutrin,  however he has been on this for multiple years without any side effects.

## 2016-03-17 NOTE — Progress Notes (Signed)
  Echocardiogram 2D Echocardiogram has been performed.  Diamond Nickel 03/17/2016, 8:58 AM

## 2016-03-17 NOTE — Progress Notes (Signed)
Patient states his wife is unable to obtain video from gym of his syncopal episode.  Spoke with Dr. Katherine Roan with neurology states patient can discharge home without seizure medications.  Dr. Sloan Leiter paged and notified.  Sanda Linger

## 2016-03-17 NOTE — Progress Notes (Signed)
Patient Name: [name] Date of Encounter: 03/17/2016     Principal Problem:   Syncope Active Problems:   HLD (hyperlipidemia)   Mobitz type 1 second degree atrioventricular block    SUBJECTIVE  No complaints. Resting comfortably  CURRENT MEDS . buPROPion  150 mg Oral BID  . docusate sodium  200 mg Oral Daily  . famotidine  20 mg Oral QHS  . FLUoxetine  40 mg Oral Daily  . fluticasone  2 spray Each Nare Daily  . guaiFENesin  600 mg Oral BID  . loratadine  10 mg Oral Daily  . methylphenidate  10 mg Oral TID WC  . multivitamin with minerals  1 tablet Oral Daily  . pantoprazole  40 mg Oral Daily  . polycarbophil  625 mg Oral Daily  . simvastatin  20 mg Oral QHS  . tamsulosin  0.4 mg Oral QPC supper    OBJECTIVE  Vitals:   03/16/16 0631 03/16/16 1435 03/16/16 2142 03/17/16 0441  BP: 97/62  (!) 102/50 (!) 107/56  Pulse:   65 67  Resp:      Temp:  98.1 F (36.7 C) 98.5 F (36.9 C) 98.5 F (36.9 C)  TempSrc:  Oral Oral Oral  SpO2:  95% 97% 98%  Weight:    158 lb (71.7 kg)  Height:        Intake/Output Summary (Last 24 hours) at 03/17/16 0641 Last data filed at 03/17/16 0301  Gross per 24 hour  Intake           993.75 ml  Output              450 ml  Net           543.75 ml   Filed Weights   03/16/16 0045 03/16/16 0611 03/17/16 0441  Weight: 159 lb (72.1 kg) 156 lb 14.4 oz (71.2 kg) 158 lb (71.7 kg)    PHYSICAL EXAM  General: Pleasant, NAD. Neuro: Alert and oriented X 3. Moves all extremities spontaneously. Psych: Normal affect. HEENT:  Normal  Neck: Supple without bruits or JVD. Lungs:  Resp regular and unlabored, CTA. Heart: RRR no s3, s4, or murmurs. Abdomen: Soft, non-tender, non-distended, BS + x 4.  Extremities: No clubbing, cyanosis or edema. DP/PT/Radials 2+ and equal bilaterally.  Accessory Clinical Findings  CBC  Recent Labs  03/15/16 2039 03/16/16 0329 03/17/16 0445  WBC 12.2* 8.5 6.3  NEUTROABS 9.8*  --   --   HGB 14.9  13.4 12.5*  HCT 44.2 39.5 37.8*  MCV 90.8 90.6 91.1  PLT 185 148* [id number]*   Basic Metabolic Panel  Recent Labs  03/16/16 0329 03/17/16 0445  NA 140 140  K 4.0 3.7  CL 106 107  CO2 27 27  GLUCOSE 96 92  BUN 17 15  CREATININE 1.11 0.98  CALCIUM 8.8* 8.4*   Liver Function Tests  Recent Labs  03/15/16 2039 03/16/16 0329  AST 26 24  ALT 21 20  ALKPHOS 64 60  BILITOT 0.5 0.6  PROT 6.7 5.9*  ALBUMIN 4.1 3.5   No results for input(s): LIPASE, AMYLASE in the last 72 hours. Cardiac Enzymes  Recent Labs  03/16/16 0329 03/16/16 1115 03/16/16 1628  CKTOTAL 296  --   --   TROPONINI <0.03 <0.03 <0.03   BNP Invalid input(s): POCBNP D-Dimer No results for input(s): DDIMER in the last 72 hours. Hemoglobin A1C No results for input(s): HGBA1C in the last 72 hours. Fasting Lipid Panel No results  for input(s): CHOL, HDL, LDLCALC, TRIG, CHOLHDL, LDLDIRECT in the last 72 hours. Thyroid Function Tests  Recent Labs  03/16/16 0329  TSH 7.139*    TELE  NSR  Radiology/Studies  Ct Head Wo Contrast  Result Date: 03/15/2016 CLINICAL DATA:  Unwitnessed syncopal episode at the gym today. Forehead laceration. EXAM: CT HEAD WITHOUT CONTRAST CT CERVICAL SPINE WITHOUT CONTRAST TECHNIQUE: Multidetector CT imaging of the head and cervical spine was performed following the standard protocol without intravenous contrast. Multiplanar CT image reconstructions of the cervical spine were also generated. COMPARISON:  10/21/2015 FINDINGS: CT HEAD FINDINGS Brain: No evidence of acute infarction, hemorrhage, hydrocephalus, extra-axial collection or mass lesion/mass effect. Vascular: No hyperdense vessel or unexpected calcification. Skull: Normal. Negative for fracture or focal lesion. Sinuses/Orbits: No acute finding. CT CERVICAL SPINE FINDINGS Alignment: Normal Skull base and vertebrae: No acute fracture. No primary bone lesion or focal pathologic process. Soft tissues and spinal canal: No  prevertebral fluid or swelling. No visible canal hematoma. Disc levels: Moderate degenerative cervical disc disease, greatest at C5-6 and C6-7. Upper chest: No significant abnormality Other: IMPRESSION: 1. No acute intracranial findings.  Normal brain. 2. No acute cervical spine findings. Moderate cervical degenerative disc disease at C5-6 and C6-7. Electronically Signed   By: Andreas Newport M.D.   On: 03/15/2016 22:54   Ct Chest W Contrast  Result Date: 03/15/2016 CLINICAL DATA:  Unwitnessed syncopal episode at the gym today. Right-sided chest pain. EXAM: CT CHEST, ABDOMEN, AND PELVIS WITH CONTRAST TECHNIQUE: Multidetector CT imaging of the chest, abdomen and pelvis was performed following the standard protocol during bolus administration of intravenous contrast. CONTRAST:  1ooml ISOVUE-300 IOPAMIDOL (ISOVUE-300) INJECTION 61% COMPARISON:  08/18/2012 FINDINGS: CT CHEST FINDINGS Cardiovascular: The thoracic aorta is normal in caliber and intact. No acute intrathoracic vascular abnormality. Mediastinum/Nodes: Normal mediastinum and hila.  No adenopathy. Lungs/Pleura: There is a 5 x 6 mm nodule at the lateral periphery of the left lower lobe, unchanged from 08/18/2012. This is benign and does not require an additional evaluation. The lungs are otherwise clear except for minor scarring in the apices. Airways are patent. Musculoskeletal: No significant skeletal lesion. No fracture is evident. Moderate thoracic degenerative disc disease is present. CT ABDOMEN PELVIS FINDINGS Hepatobiliary: Cholecystectomy. The liver and bile ducts appear normal. Pancreas: Normal Spleen: Normal Adrenals/Urinary Tract: Benign simple renal cysts bilaterally, measuring up to 3.3 cm at the right upper pole and 6.2 cm at the left posterior midpole. The adrenals and kidneys are otherwise normal in appearance. There is no urinary calculus evident. There is no hydronephrosis or ureteral dilatation. Collecting systems and ureters appear  unremarkable. The urinary bladder is unremarkable. Stomach/Bowel: Hiatal hernia, otherwise normal appearances of the stomach, small bowel and colon. Prior appendectomy. Vascular/Lymphatic: The abdominal aorta is normal in caliber. There is no atherosclerotic calcification. There is no adenopathy in the abdomen or pelvis. Reproductive: Unremarkable Other: No acute findings are evident in the abdomen or pelvis. No ascites. Musculoskeletal: No significant skeletal lesion. Negative for acute fracture. IMPRESSION: 1. No acute findings are evident in the chest, abdomen or pelvis. 2. Benign 5 x 6 mm left lower lobe pulmonary nodule. This does not require additional evaluation. 3. Small hiatal hernia. Electronically Signed   By: Andreas Newport M.D.   On: 03/15/2016 23:06   Ct Cervical Spine Wo Contrast  Result Date: 03/15/2016 CLINICAL DATA:  Unwitnessed syncopal episode at the gym today. Forehead laceration. EXAM: CT HEAD WITHOUT CONTRAST CT CERVICAL SPINE WITHOUT CONTRAST TECHNIQUE: Multidetector CT  imaging of the head and cervical spine was performed following the standard protocol without intravenous contrast. Multiplanar CT image reconstructions of the cervical spine were also generated. COMPARISON:  10/21/2015 FINDINGS: CT HEAD FINDINGS Brain: No evidence of acute infarction, hemorrhage, hydrocephalus, extra-axial collection or mass lesion/mass effect. Vascular: No hyperdense vessel or unexpected calcification. Skull: Normal. Negative for fracture or focal lesion. Sinuses/Orbits: No acute finding. CT CERVICAL SPINE FINDINGS Alignment: Normal Skull base and vertebrae: No acute fracture. No primary bone lesion or focal pathologic process. Soft tissues and spinal canal: No prevertebral fluid or swelling. No visible canal hematoma. Disc levels: Moderate degenerative cervical disc disease, greatest at C5-6 and C6-7. Upper chest: No significant abnormality Other: IMPRESSION: 1. No acute intracranial findings.  Normal  brain. 2. No acute cervical spine findings. Moderate cervical degenerative disc disease at C5-6 and C6-7. Electronically Signed   By: Andreas Newport M.D.   On: 03/15/2016 22:54   Mr Brain Wo Contrast  Result Date: 03/16/2016 CLINICAL DATA:  65 year old male with syncope while exercising. Initial encounter. EXAM: MRI HEAD WITHOUT CONTRAST TECHNIQUE: Multiplanar, multiecho pulse sequences of the brain and surrounding structures were obtained without intravenous contrast. COMPARISON:  Head and cervical spine CT 03/15/2016. Brain MRI 10/21/2015. FINDINGS: Brain: No restricted diffusion to suggest acute infarction. No midline shift, mass effect, evidence of mass lesion, ventriculomegaly, extra-axial collection or acute intracranial hemorrhage. Cervicomedullary junction and pituitary are within normal limits. Stable gray and white matter signal throughout the brain. Scattered small nonspecific foci of cerebral white matter T2 and FLAIR hyperintensity, mild to moderate for age. No cortical encephalomalacia. No chronic cerebral blood products. Negative deep gray matter nuclei, brainstem, and cerebellum. Vascular: Major intracranial vascular flow voids are stable with a degree of generalized intracranial artery dolichoectasia. Skull and upper cervical spine: Negative visualized cervical spine. Normal bone marrow signal. Sinuses/Orbits: Stable postoperative changes to the globes. Otherwise negative orbit and scalp soft tissues. Mild paranasal sinus mucosal thickening, sparing the sphenoid sinuses. Stable trace mastoid fluid. Other: Visible internal auditory structures appear normal. IMPRESSION: 1.  No acute intracranial abnormality. 2. Stable noncontrast MRI appearance of the brain since April with mild for age nonspecific white matter signal changes. 3. Mild paranasal sinus inflammation sparing the sphenoid sinuses. Electronically Signed   By: Genevie Ann M.D.   On: 03/16/2016 11:04   Ct Abdomen Pelvis W  Contrast  Result Date: 03/15/2016 CLINICAL DATA:  Unwitnessed syncopal episode at the gym today. Right-sided chest pain. EXAM: CT CHEST, ABDOMEN, AND PELVIS WITH CONTRAST TECHNIQUE: Multidetector CT imaging of the chest, abdomen and pelvis was performed following the standard protocol during bolus administration of intravenous contrast. CONTRAST:  1ooml ISOVUE-300 IOPAMIDOL (ISOVUE-300) INJECTION 61% COMPARISON:  08/18/2012 FINDINGS: CT CHEST FINDINGS Cardiovascular: The thoracic aorta is normal in caliber and intact. No acute intrathoracic vascular abnormality. Mediastinum/Nodes: Normal mediastinum and hila.  No adenopathy. Lungs/Pleura: There is a 5 x 6 mm nodule at the lateral periphery of the left lower lobe, unchanged from 08/18/2012. This is benign and does not require an additional evaluation. The lungs are otherwise clear except for minor scarring in the apices. Airways are patent. Musculoskeletal: No significant skeletal lesion. No fracture is evident. Moderate thoracic degenerative disc disease is present. CT ABDOMEN PELVIS FINDINGS Hepatobiliary: Cholecystectomy. The liver and bile ducts appear normal. Pancreas: Normal Spleen: Normal Adrenals/Urinary Tract: Benign simple renal cysts bilaterally, measuring up to 3.3 cm at the right upper pole and 6.2 cm at the left posterior midpole. The adrenals and kidneys  are otherwise normal in appearance. There is no urinary calculus evident. There is no hydronephrosis or ureteral dilatation. Collecting systems and ureters appear unremarkable. The urinary bladder is unremarkable. Stomach/Bowel: Hiatal hernia, otherwise normal appearances of the stomach, small bowel and colon. Prior appendectomy. Vascular/Lymphatic: The abdominal aorta is normal in caliber. There is no atherosclerotic calcification. There is no adenopathy in the abdomen or pelvis. Reproductive: Unremarkable Other: No acute findings are evident in the abdomen or pelvis. No ascites. Musculoskeletal: No  significant skeletal lesion. Negative for acute fracture. IMPRESSION: 1. No acute findings are evident in the chest, abdomen or pelvis. 2. Benign 5 x 6 mm left lower lobe pulmonary nodule. This does not require additional evaluation. 3. Small hiatal hernia. Electronically Signed   By: Andreas Newport M.D.   On: 03/15/2016 23:06    ASSESSMENT AND PLAN  [name] is a 65 y.o. male with a history of Barretts esophagitis, GERD with chronic cough, primary hyperparathyroidism s/p MI parathyroidectomy and HLD who presented to the [facility] ER on 03/15/16 after an episode of syncope.   Syncope: History sounds neurological. Has had prior fainting spells dating back to childhood. Had w/u at [facility] in [place] 5-7 years ago. Electrocardiogram simply reveals normal sinus rhythm with frequent PACs. No AV block is noted. I do not believe that the current presentation represents cardiac syncope due to the patient's confusion state post event which is likely post-ictal. Continue workup for neurological etiology. Consider 30 day monitor should no explanation for the presentation be identified. Relatively recent echo was unremarkable. -- Please let us know when patient will be discharged so we can set up cardiac monitor if no neurologic explanation can be found   Signed, Angelena Form PA-C  Pager [phone]  The patient has been seen in conjunction with Nell Range, [place]. All aspects of care have been considered and discussed. The patient has been personally interviewed, examined, and all clinical data has been reviewed.   Rhythm strips since admission have been thoroughly reviewed. No bradycardia or tach arrhythmias that would cause cerebral hypoperfusion and syncope.  Echo demonstrates a structurally normal heart.  30 day monitor should be worn after discharge to further exclude the possibility of arrhythmia as the source of this episode.

## 2016-03-17 NOTE — Discharge Summary (Signed)
PATIENT DETAILS Name: [name] Age: 65 y.o. Sex: male Date of Birth: [birth year] MRN: [id number]. Admitting Physician: Rise Patience, MD [id number] A, MD  Admit Date: 03/15/2016 Discharge date: 03/17/2016  Recommendations for Outpatient Follow-up:  1. Follow up with PCP in 1-2 weeks 2. Please obtain BMP/CBC in one week 3. Ensure follow-up with cardiology for 30 day event monitor and neurology   Admitted From:  Home   Disposition: [place]: No  Equipment/Devices: None  Discharge Condition: Stable  CODE STATUS: FULL CODE  Diet recommendation:  Heart Healthy   Brief Summary: See H&P, Labs, Consult and Test reports for all details in brief, patient was admitted for evaluation of a syncopal episode  Brief Hospital Course: Syncope: Etiology not evident, patient was seen by both cardiology and neurology. Extensive workup including telemetry monitoring was negative. Cardiology did not think that patient had a cardiac syncope, however they will follow patient in the outpatient setting for a 30 day monitor. Patient underwent a MRI of the brain-that was negative for acute abnormalities, EEG was negative for seizures. Neurology was consulted, per neurology no indication for starting antiepileptics at this time. Patient's family is trying to get the video of the gym where he syncopized, there have been unsuccessful so far-I have asked them to take the video to his primary neurologist in the outpatient setting for review. He is currently stable for discharge. Patient was asked not to drive, until cleared by his outpatient neurologist.  Rest of the patient's medical problems stable during this hospital course.   Procedures/Studies: Echo 9/13: EF 50-55%. EEG 9/12: Negative for seizures  Discharge Diagnoses:  Principal Problem:   Syncope Active Problems:   HLD (hyperlipidemia)  Discharge Instructions:  Activity:  As tolerated with Full fall  precautions use walker/cane & assistance as needed   Discharge Instructions    Call MD for:  extreme fatigue    Complete by:  As directed    Call MD for:  persistant dizziness or light-headedness    Complete by:  As directed    Diet - low sodium heart healthy    Complete by:  As directed    Discharge instructions    Complete by:  As directed    Follow with Primary MD  Lilian Coma, MD  and other consultant's as instructed your Hospitalist MD  You may have had a questionable seizure-please do not drive, participate in activities of heights, high-speed sports-until cleared by outpatient neurologist.  Please get a complete blood count and chemistry panel checked by your Primary MD at your next visit, and again as instructed by your Primary MD.  Get Medicines reviewed and adjusted: Please take all your medications with you for your next visit with your Primary MD  Laboratory/radiological data: Please request your Primary MD to go over all hospital tests and procedure/radiological results at the follow up, please ask your Primary MD to get all Hospital records sent to his/her office.  In some cases, they will be blood work, cultures and biopsy results pending at the time of your discharge. Please request that your primary care M.D. follows up on these results.  Also Note the following: If you experience worsening of your admission symptoms, develop shortness of breath, life threatening emergency, suicidal or homicidal thoughts you must seek medical attention immediately by calling 911 or calling your MD immediately  if symptoms less severe.  You must read complete instructions/literature along with all the possible adverse reactions/side effects for  all the Medicines you take and that have been prescribed to you. Take any new Medicines after you have completely understood and accpet all the possible adverse reactions/side effects.   Do not drive when taking Pain medications or sleeping  medications (Benzodaizepines)  Do not take more than prescribed Pain, Sleep and Anxiety Medications. It is not advisable to combine anxiety,sleep and pain medications without talking with your primary care practitioner  Special Instructions: If you have smoked or chewed Tobacco  in the last 2 yrs please stop smoking, stop any regular Alcohol  and or any Recreational drug use.  Wear Seat belts while driving.  Please note: You were cared for by a hospitalist during your hospital stay. Once you are discharged, your primary care physician will handle any further medical issues. Please note that NO REFILLS for any discharge medications will be authorized once you are discharged, as it is imperative that you return to your primary care physician (or establish a relationship with a primary care physician if you do not have one) for your post hospital discharge needs so that they can reassess your need for medications and monitor your lab values.   Increase activity slowly    Complete by:  As directed        Medication List    TAKE these medications   ALPRAZolam 0.5 MG tablet Commonly known as:  XANAX Take 1 tablet (0.5 mg total) by mouth at bedtime as needed for anxiety.   buPROPion 150 MG 12 hr tablet Commonly known as:  WELLBUTRIN SR Take 150 mg by mouth 2 (two) times daily.   cetirizine 10 MG tablet Commonly known as:  ZYRTEC Take 10 mg by mouth at bedtime.   DELSYM 30 MG/5ML liquid Generic drug:  dextromethorphan Take 10 mLs by mouth every 12 (twelve) hours as needed for cough (with flutter valve).   dextromethorphan-guaiFENesin 30-600 MG 12hr tablet Commonly known as:  MUCINEX DM Take 1 tablet by mouth daily as needed for cough. For sinus per patient   diphenhydramine-acetaminophen 25-500 MG Tabs tablet Commonly known as:  TYLENOL PM Take 1 tablet by mouth at bedtime as needed. For sleep   docusate sodium 100 MG capsule Commonly known as:  COLACE Take 200 mg by mouth  daily.   famotidine 20 MG tablet Commonly known as:  PEPCID Take 1 tablet (20 mg total) by mouth at bedtime.   FIBER SELECT GUMMIES Chew Chew 2 each by mouth daily.   FLUoxetine 40 MG capsule Commonly known as:  PROZAC Take 40 mg by mouth every morning.   fluticasone 50 MCG/ACT nasal spray Commonly known as:  FLONASE Place 2 sprays into both nostrils 2 (two) times daily. What changed:  when to take this   FLUTTER Devi Use as directed   methylphenidate 10 MG tablet Commonly known as:  RITALIN Take 10 mg by mouth 3 (three) times daily.   multivitamin with minerals Tabs tablet Take 1 tablet by mouth daily.   omeprazole 20 MG capsule Commonly known as:  PRILOSEC Take 40 mg by mouth 2 (two) times daily before a meal.   polyethylene glycol packet Commonly known as:  MIRALAX / GLYCOLAX Take 17 g by mouth daily as needed for mild constipation. Use daily as directed as needed for constipation   PROBIOTIC DAILY Caps Take 2 capsules by mouth every morning. Reported on 08/21/2015   simvastatin 20 MG tablet Commonly known as:  ZOCOR Take 20 mg by mouth at bedtime.   tamsulosin 0.4  MG Caps capsule Commonly known as:  FLOMAX Take 0.4 mg by mouth daily after supper.      Follow-up Information    [facility] .   Why:  The office will call you to set up a heart monitor.  Contact information: [place] [phone] [place], MD. Schedule an appointment as soon as possible for a visit in 1 week(s).   Specialty:  Family Medicine Contact information: [address] [place] [address] [place], DO. Schedule an appointment as soon as possible for a visit in 2 week(s).   Specialty:  Neurology Contact information: [address] [place] [address] [phone]          Allergies   Allergen Reactions  . Ciprofloxacin Hcl Other (See Comments)    Burning in ear when drops were used  . Asa [Aspirin]     Upset stomach   . Gabapentin     Weakness and dizziness    Consultations:   cardiology and neurology  Other Procedures/Studies: Ct Head Wo Contrast  Result Date: 03/15/2016 CLINICAL DATA:  Unwitnessed syncopal episode at the gym today. Forehead laceration. EXAM: CT HEAD WITHOUT CONTRAST CT CERVICAL SPINE WITHOUT CONTRAST TECHNIQUE: Multidetector CT imaging of the head and cervical spine was performed following the standard protocol without intravenous contrast. Multiplanar CT image reconstructions of the cervical spine were also generated. COMPARISON:  10/21/2015 FINDINGS: CT HEAD FINDINGS Brain: No evidence of acute infarction, hemorrhage, hydrocephalus, extra-axial collection or mass lesion/mass effect. Vascular: No hyperdense vessel or unexpected calcification. Skull: Normal. Negative for fracture or focal lesion. Sinuses/Orbits: No acute finding. CT CERVICAL SPINE FINDINGS Alignment: Normal Skull base and vertebrae: No acute fracture. No primary bone lesion or focal pathologic process. Soft tissues and spinal canal: No prevertebral fluid or swelling. No visible canal hematoma. Disc levels: Moderate degenerative cervical disc disease, greatest at C5-6 and C6-7. Upper chest: No significant abnormality Other: IMPRESSION: 1. No acute intracranial findings.  Normal brain. 2. No acute cervical spine findings. Moderate cervical degenerative disc disease at C5-6 and C6-7. Electronically Signed   By: Andreas Newport M.D.   On: 03/15/2016 22:54   Ct Chest W Contrast  Result Date: 03/15/2016 CLINICAL DATA:  Unwitnessed syncopal episode at the gym today. Right-sided chest pain. EXAM: CT CHEST, ABDOMEN, AND PELVIS WITH CONTRAST TECHNIQUE: Multidetector CT imaging of the chest, abdomen and pelvis was performed following the standard protocol during bolus administration of intravenous  contrast. CONTRAST:  1ooml ISOVUE-300 IOPAMIDOL (ISOVUE-300) INJECTION 61% COMPARISON:  08/18/2012 FINDINGS: CT CHEST FINDINGS Cardiovascular: The thoracic aorta is normal in caliber and intact. No acute intrathoracic vascular abnormality. Mediastinum/Nodes: Normal mediastinum and hila.  No adenopathy. Lungs/Pleura: There is a 5 x 6 mm nodule at the lateral periphery of the left lower lobe, unchanged from 08/18/2012. This is benign and does not require an additional evaluation. The lungs are otherwise clear except for minor scarring in the apices. Airways are patent. Musculoskeletal: No significant skeletal lesion. No fracture is evident. Moderate thoracic degenerative disc disease is present. CT ABDOMEN PELVIS FINDINGS Hepatobiliary: Cholecystectomy. The liver and bile ducts appear normal. Pancreas: Normal Spleen: Normal Adrenals/Urinary Tract: Benign simple renal cysts bilaterally, measuring up to 3.3 cm at the right upper pole and 6.2 cm at the left posterior midpole. The adrenals and kidneys are otherwise  normal in appearance. There is no urinary calculus evident. There is no hydronephrosis or ureteral dilatation. Collecting systems and ureters appear unremarkable. The urinary bladder is unremarkable. Stomach/Bowel: Hiatal hernia, otherwise normal appearances of the stomach, small bowel and colon. Prior appendectomy. Vascular/Lymphatic: The abdominal aorta is normal in caliber. There is no atherosclerotic calcification. There is no adenopathy in the abdomen or pelvis. Reproductive: Unremarkable Other: No acute findings are evident in the abdomen or pelvis. No ascites. Musculoskeletal: No significant skeletal lesion. Negative for acute fracture. IMPRESSION: 1. No acute findings are evident in the chest, abdomen or pelvis. 2. Benign 5 x 6 mm left lower lobe pulmonary nodule. This does not require additional evaluation. 3. Small hiatal hernia. Electronically Signed   By: Andreas Newport M.D.   On: 03/15/2016  23:06   Ct Cervical Spine Wo Contrast  Result Date: 03/15/2016 CLINICAL DATA:  Unwitnessed syncopal episode at the gym today. Forehead laceration. EXAM: CT HEAD WITHOUT CONTRAST CT CERVICAL SPINE WITHOUT CONTRAST TECHNIQUE: Multidetector CT imaging of the head and cervical spine was performed following the standard protocol without intravenous contrast. Multiplanar CT image reconstructions of the cervical spine were also generated. COMPARISON:  10/21/2015 FINDINGS: CT HEAD FINDINGS Brain: No evidence of acute infarction, hemorrhage, hydrocephalus, extra-axial collection or mass lesion/mass effect. Vascular: No hyperdense vessel or unexpected calcification. Skull: Normal. Negative for fracture or focal lesion. Sinuses/Orbits: No acute finding. CT CERVICAL SPINE FINDINGS Alignment: Normal Skull base and vertebrae: No acute fracture. No primary bone lesion or focal pathologic process. Soft tissues and spinal canal: No prevertebral fluid or swelling. No visible canal hematoma. Disc levels: Moderate degenerative cervical disc disease, greatest at C5-6 and C6-7. Upper chest: No significant abnormality Other: IMPRESSION: 1. No acute intracranial findings.  Normal brain. 2. No acute cervical spine findings. Moderate cervical degenerative disc disease at C5-6 and C6-7. Electronically Signed   By: Andreas Newport M.D.   On: 03/15/2016 22:54   Mr Brain Wo Contrast  Result Date: 03/16/2016 CLINICAL DATA:  65 year old male with syncope while exercising. Initial encounter. EXAM: MRI HEAD WITHOUT CONTRAST TECHNIQUE: Multiplanar, multiecho pulse sequences of the brain and surrounding structures were obtained without intravenous contrast. COMPARISON:  Head and cervical spine CT 03/15/2016. Brain MRI 10/21/2015. FINDINGS: Brain: No restricted diffusion to suggest acute infarction. No midline shift, mass effect, evidence of mass lesion, ventriculomegaly, extra-axial collection or acute intracranial hemorrhage.  Cervicomedullary junction and pituitary are within normal limits. Stable gray and white matter signal throughout the brain. Scattered small nonspecific foci of cerebral white matter T2 and FLAIR hyperintensity, mild to moderate for age. No cortical encephalomalacia. No chronic cerebral blood products. Negative deep gray matter nuclei, brainstem, and cerebellum. Vascular: Major intracranial vascular flow voids are stable with a degree of generalized intracranial artery dolichoectasia. Skull and upper cervical spine: Negative visualized cervical spine. Normal bone marrow signal. Sinuses/Orbits: Stable postoperative changes to the globes. Otherwise negative orbit and scalp soft tissues. Mild paranasal sinus mucosal thickening, sparing the sphenoid sinuses. Stable trace mastoid fluid. Other: Visible internal auditory structures appear normal. IMPRESSION: 1.  No acute intracranial abnormality. 2. Stable noncontrast MRI appearance of the brain since April with mild for age nonspecific white matter signal changes. 3. Mild paranasal sinus inflammation sparing the sphenoid sinuses. Electronically Signed   By: Genevie Ann M.D.   On: 03/16/2016 11:04   Ct Abdomen Pelvis W Contrast  Result Date: 03/15/2016 CLINICAL DATA:  Unwitnessed syncopal episode at the gym today. Right-sided chest pain. EXAM: CT CHEST, ABDOMEN,  AND PELVIS WITH CONTRAST TECHNIQUE: Multidetector CT imaging of the chest, abdomen and pelvis was performed following the standard protocol during bolus administration of intravenous contrast. CONTRAST:  1ooml ISOVUE-300 IOPAMIDOL (ISOVUE-300) INJECTION 61% COMPARISON:  08/18/2012 FINDINGS: CT CHEST FINDINGS Cardiovascular: The thoracic aorta is normal in caliber and intact. No acute intrathoracic vascular abnormality. Mediastinum/Nodes: Normal mediastinum and hila.  No adenopathy. Lungs/Pleura: There is a 5 x 6 mm nodule at the lateral periphery of the left lower lobe, unchanged from 08/18/2012. This is benign and  does not require an additional evaluation. The lungs are otherwise clear except for minor scarring in the apices. Airways are patent. Musculoskeletal: No significant skeletal lesion. No fracture is evident. Moderate thoracic degenerative disc disease is present. CT ABDOMEN PELVIS FINDINGS Hepatobiliary: Cholecystectomy. The liver and bile ducts appear normal. Pancreas: Normal Spleen: Normal Adrenals/Urinary Tract: Benign simple renal cysts bilaterally, measuring up to 3.3 cm at the right upper pole and 6.2 cm at the left posterior midpole. The adrenals and kidneys are otherwise normal in appearance. There is no urinary calculus evident. There is no hydronephrosis or ureteral dilatation. Collecting systems and ureters appear unremarkable. The urinary bladder is unremarkable. Stomach/Bowel: Hiatal hernia, otherwise normal appearances of the stomach, small bowel and colon. Prior appendectomy. Vascular/Lymphatic: The abdominal aorta is normal in caliber. There is no atherosclerotic calcification. There is no adenopathy in the abdomen or pelvis. Reproductive: Unremarkable Other: No acute findings are evident in the abdomen or pelvis. No ascites. Musculoskeletal: No significant skeletal lesion. Negative for acute fracture. IMPRESSION: 1. No acute findings are evident in the chest, abdomen or pelvis. 2. Benign 5 x 6 mm left lower lobe pulmonary nodule. This does not require additional evaluation. 3. Small hiatal hernia. Electronically Signed   By: Andreas Newport M.D.   On: 03/15/2016 23:06      TODAY-DAY OF DISCHARGE:  Subjective:   Bennetta [place] today has no headache,no chest abdominal pain,no new weakness tingling or numbness, feels much better wants to go home today.   Objective:   Blood pressure (!) 94/49, pulse 70, temperature 98.5 F (36.9 C), temperature source Oral, resp. rate 20, height 6' (1.829 m), weight 71.7 kg (158 lb), SpO2 100 %.  Intake/Output Summary (Last 24 hours) at 03/17/16  1808 Last data filed at 03/17/16 1700  Gross per 24 hour  Intake          1113.75 ml  Output              450 ml  Net           663.75 ml   Filed Weights   03/16/16 0045 03/16/16 0611 03/17/16 0441  Weight: 72.1 kg (159 lb) 71.2 kg (156 lb 14.4 oz) 71.7 kg (158 lb)    Exam: Awake Alert, Oriented *3, No new F.N deficits, Normal affect [place].AT,PERRAL Supple Neck,No JVD, No cervical lymphadenopathy appriciated.  Symmetrical Chest wall movement, Good air movement bilaterally, CTAB RRR,No Gallops,Rubs or new Murmurs, No Parasternal Heave +ve B.Sounds, Abd Soft, Non tender, No organomegaly appriciated, No rebound -guarding or rigidity. No Cyanosis, Clubbing or edema, No new Rash or bruise   PERTINENT RADIOLOGIC STUDIES: Ct Head Wo Contrast  Result Date: 03/15/2016 CLINICAL DATA:  Unwitnessed syncopal episode at the gym today. Forehead laceration. EXAM: CT HEAD WITHOUT CONTRAST CT CERVICAL SPINE WITHOUT CONTRAST TECHNIQUE: Multidetector CT imaging of the head and cervical spine was performed following the standard protocol without intravenous contrast. Multiplanar CT image reconstructions of the cervical spine were also  generated. COMPARISON:  10/21/2015 FINDINGS: CT HEAD FINDINGS Brain: No evidence of acute infarction, hemorrhage, hydrocephalus, extra-axial collection or mass lesion/mass effect. Vascular: No hyperdense vessel or unexpected calcification. Skull: Normal. Negative for fracture or focal lesion. Sinuses/Orbits: No acute finding. CT CERVICAL SPINE FINDINGS Alignment: Normal Skull base and vertebrae: No acute fracture. No primary bone lesion or focal pathologic process. Soft tissues and spinal canal: No prevertebral fluid or swelling. No visible canal hematoma. Disc levels: Moderate degenerative cervical disc disease, greatest at C5-6 and C6-7. Upper chest: No significant abnormality Other: IMPRESSION: 1. No acute intracranial findings.  Normal brain. 2. No acute cervical spine findings.  Moderate cervical degenerative disc disease at C5-6 and C6-7. Electronically Signed   By: Andreas Newport M.D.   On: 03/15/2016 22:54   Ct Chest W Contrast  Result Date: 03/15/2016 CLINICAL DATA:  Unwitnessed syncopal episode at the gym today. Right-sided chest pain. EXAM: CT CHEST, ABDOMEN, AND PELVIS WITH CONTRAST TECHNIQUE: Multidetector CT imaging of the chest, abdomen and pelvis was performed following the standard protocol during bolus administration of intravenous contrast. CONTRAST:  1ooml ISOVUE-300 IOPAMIDOL (ISOVUE-300) INJECTION 61% COMPARISON:  08/18/2012 FINDINGS: CT CHEST FINDINGS Cardiovascular: The thoracic aorta is normal in caliber and intact. No acute intrathoracic vascular abnormality. Mediastinum/Nodes: Normal mediastinum and hila.  No adenopathy. Lungs/Pleura: There is a 5 x 6 mm nodule at the lateral periphery of the left lower lobe, unchanged from 08/18/2012. This is benign and does not require an additional evaluation. The lungs are otherwise clear except for minor scarring in the apices. Airways are patent. Musculoskeletal: No significant skeletal lesion. No fracture is evident. Moderate thoracic degenerative disc disease is present. CT ABDOMEN PELVIS FINDINGS Hepatobiliary: Cholecystectomy. The liver and bile ducts appear normal. Pancreas: Normal Spleen: Normal Adrenals/Urinary Tract: Benign simple renal cysts bilaterally, measuring up to 3.3 cm at the right upper pole and 6.2 cm at the left posterior midpole. The adrenals and kidneys are otherwise normal in appearance. There is no urinary calculus evident. There is no hydronephrosis or ureteral dilatation. Collecting systems and ureters appear unremarkable. The urinary bladder is unremarkable. Stomach/Bowel: Hiatal hernia, otherwise normal appearances of the stomach, small bowel and colon. Prior appendectomy. Vascular/Lymphatic: The abdominal aorta is normal in caliber. There is no atherosclerotic calcification. There is no  adenopathy in the abdomen or pelvis. Reproductive: Unremarkable Other: No acute findings are evident in the abdomen or pelvis. No ascites. Musculoskeletal: No significant skeletal lesion. Negative for acute fracture. IMPRESSION: 1. No acute findings are evident in the chest, abdomen or pelvis. 2. Benign 5 x 6 mm left lower lobe pulmonary nodule. This does not require additional evaluation. 3. Small hiatal hernia. Electronically Signed   By: Andreas Newport M.D.   On: 03/15/2016 23:06   Ct Cervical Spine Wo Contrast  Result Date: 03/15/2016 CLINICAL DATA:  Unwitnessed syncopal episode at the gym today. Forehead laceration. EXAM: CT HEAD WITHOUT CONTRAST CT CERVICAL SPINE WITHOUT CONTRAST TECHNIQUE: Multidetector CT imaging of the head and cervical spine was performed following the standard protocol without intravenous contrast. Multiplanar CT image reconstructions of the cervical spine were also generated. COMPARISON:  10/21/2015 FINDINGS: CT HEAD FINDINGS Brain: No evidence of acute infarction, hemorrhage, hydrocephalus, extra-axial collection or mass lesion/mass effect. Vascular: No hyperdense vessel or unexpected calcification. Skull: Normal. Negative for fracture or focal lesion. Sinuses/Orbits: No acute finding. CT CERVICAL SPINE FINDINGS Alignment: Normal Skull base and vertebrae: No acute fracture. No primary bone lesion or focal pathologic process. Soft tissues and spinal canal:  No prevertebral fluid or swelling. No visible canal hematoma. Disc levels: Moderate degenerative cervical disc disease, greatest at C5-6 and C6-7. Upper chest: No significant abnormality Other: IMPRESSION: 1. No acute intracranial findings.  Normal brain. 2. No acute cervical spine findings. Moderate cervical degenerative disc disease at C5-6 and C6-7. Electronically Signed   By: Andreas Newport M.D.   On: 03/15/2016 22:54   Mr Brain Wo Contrast  Result Date: 03/16/2016 CLINICAL DATA:  65 year old male with syncope while  exercising. Initial encounter. EXAM: MRI HEAD WITHOUT CONTRAST TECHNIQUE: Multiplanar, multiecho pulse sequences of the brain and surrounding structures were obtained without intravenous contrast. COMPARISON:  Head and cervical spine CT 03/15/2016. Brain MRI 10/21/2015. FINDINGS: Brain: No restricted diffusion to suggest acute infarction. No midline shift, mass effect, evidence of mass lesion, ventriculomegaly, extra-axial collection or acute intracranial hemorrhage. Cervicomedullary junction and pituitary are within normal limits. Stable gray and white matter signal throughout the brain. Scattered small nonspecific foci of cerebral white matter T2 and FLAIR hyperintensity, mild to moderate for age. No cortical encephalomalacia. No chronic cerebral blood products. Negative deep gray matter nuclei, brainstem, and cerebellum. Vascular: Major intracranial vascular flow voids are stable with a degree of generalized intracranial artery dolichoectasia. Skull and upper cervical spine: Negative visualized cervical spine. Normal bone marrow signal. Sinuses/Orbits: Stable postoperative changes to the globes. Otherwise negative orbit and scalp soft tissues. Mild paranasal sinus mucosal thickening, sparing the sphenoid sinuses. Stable trace mastoid fluid. Other: Visible internal auditory structures appear normal. IMPRESSION: 1.  No acute intracranial abnormality. 2. Stable noncontrast MRI appearance of the brain since April with mild for age nonspecific white matter signal changes. 3. Mild paranasal sinus inflammation sparing the sphenoid sinuses. Electronically Signed   By: Genevie Ann M.D.   On: 03/16/2016 11:04   Ct Abdomen Pelvis W Contrast  Result Date: 03/15/2016 CLINICAL DATA:  Unwitnessed syncopal episode at the gym today. Right-sided chest pain. EXAM: CT CHEST, ABDOMEN, AND PELVIS WITH CONTRAST TECHNIQUE: Multidetector CT imaging of the chest, abdomen and pelvis was performed following the standard protocol during  bolus administration of intravenous contrast. CONTRAST:  1ooml ISOVUE-300 IOPAMIDOL (ISOVUE-300) INJECTION 61% COMPARISON:  08/18/2012 FINDINGS: CT CHEST FINDINGS Cardiovascular: The thoracic aorta is normal in caliber and intact. No acute intrathoracic vascular abnormality. Mediastinum/Nodes: Normal mediastinum and hila.  No adenopathy. Lungs/Pleura: There is a 5 x 6 mm nodule at the lateral periphery of the left lower lobe, unchanged from 08/18/2012. This is benign and does not require an additional evaluation. The lungs are otherwise clear except for minor scarring in the apices. Airways are patent. Musculoskeletal: No significant skeletal lesion. No fracture is evident. Moderate thoracic degenerative disc disease is present. CT ABDOMEN PELVIS FINDINGS Hepatobiliary: Cholecystectomy. The liver and bile ducts appear normal. Pancreas: Normal Spleen: Normal Adrenals/Urinary Tract: Benign simple renal cysts bilaterally, measuring up to 3.3 cm at the right upper pole and 6.2 cm at the left posterior midpole. The adrenals and kidneys are otherwise normal in appearance. There is no urinary calculus evident. There is no hydronephrosis or ureteral dilatation. Collecting systems and ureters appear unremarkable. The urinary bladder is unremarkable. Stomach/Bowel: Hiatal hernia, otherwise normal appearances of the stomach, small bowel and colon. Prior appendectomy. Vascular/Lymphatic: The abdominal aorta is normal in caliber. There is no atherosclerotic calcification. There is no adenopathy in the abdomen or pelvis. Reproductive: Unremarkable Other: No acute findings are evident in the abdomen or pelvis. No ascites. Musculoskeletal: No significant skeletal lesion. Negative for acute fracture. IMPRESSION: 1. No  acute findings are evident in the chest, abdomen or pelvis. 2. Benign 5 x 6 mm left lower lobe pulmonary nodule. This does not require additional evaluation. 3. Small hiatal hernia. Electronically Signed   By: Andreas Newport M.D.   On: 03/15/2016 23:06     PERTINENT LAB RESULTS: CBC:  Recent Labs  03/16/16 0329 03/17/16 0445  WBC 8.5 6.3  HGB 13.4 12.5*  HCT 39.5 37.8*  PLT 148* 142*   CMET CMP     Component Value Date/Time   NA 140 03/17/2016 0445   K 3.7 03/17/2016 0445   CL 107 03/17/2016 0445   CO2 27 03/17/2016 0445   GLUCOSE 92 03/17/2016 0445   BUN 15 03/17/2016 0445   CREATININE 0.98 03/17/2016 0445   CALCIUM 8.4 (L) 03/17/2016 0445   CALCIUM 9.0 02/19/2011 1233   PROT 5.9 (L) 03/16/2016 0329   PROT 6.4 02/15/2013 1454   ALBUMIN 3.5 03/16/2016 0329   AST 24 03/16/2016 0329   ALT 20 03/16/2016 0329   ALKPHOS 60 03/16/2016 0329   BILITOT 0.6 03/16/2016 0329   GFRNONAA >60 03/17/2016 0445   GFRAA >60 03/17/2016 0445    GFR Estimated Creatinine Clearance: 76.2 mL/min (by C-G formula based on SCr of 0.98 mg/dL). No results for input(s): LIPASE, AMYLASE in the last 72 hours.  Recent Labs  03/16/16 0329 03/16/16 1115 03/16/16 1628  CKTOTAL 296  --   --   TROPONINI <0.03 <0.03 <0.03   Invalid input(s): POCBNP No results for input(s): DDIMER in the last 72 hours. No results for input(s): HGBA1C in the last 72 hours. No results for input(s): CHOL, HDL, LDLCALC, TRIG, CHOLHDL, LDLDIRECT in the last 72 hours.  Recent Labs  03/16/16 0329  TSH 7.139*   No results for input(s): VITAMINB12, FOLATE, FERRITIN, TIBC, IRON, RETICCTPCT in the last 72 hours. Coags: No results for input(s): INR in the last 72 hours.  Invalid input(s): PT Microbiology: No results found for this or any previous visit (from the past 240 hour(s)).  FURTHER DISCHARGE INSTRUCTIONS:  Get Medicines reviewed and adjusted: Please take all your medications with you for your next visit with your Primary MD  Laboratory/radiological data: Please request your Primary MD to go over all hospital tests and procedure/radiological results at the follow up, please ask your Primary MD to get all Hospital  records sent to his/her office.  In some cases, they will be blood work, cultures and biopsy results pending at the time of your discharge. Please request that your primary care M.D. goes through all the records of your hospital data and follows up on these results.  Also Note the following: If you experience worsening of your admission symptoms, develop shortness of breath, life threatening emergency, suicidal or homicidal thoughts you must seek medical attention immediately by calling 911 or calling your MD immediately  if symptoms less severe.  You must read complete instructions/literature along with all the possible adverse reactions/side effects for all the Medicines you take and that have been prescribed to you. Take any new Medicines after you have completely understood and accpet all the possible adverse reactions/side effects.   Do not drive when taking Pain medications or sleeping medications (Benzodaizepines)  Do not take more than prescribed Pain, Sleep and Anxiety Medications. It is not advisable to combine anxiety,sleep and pain medications without talking with your primary care practitioner  Special Instructions: If you have smoked or chewed Tobacco  in the last 2 yrs please stop smoking, stop  any regular Alcohol  and or any Recreational drug use.  Wear Seat belts while driving.  Please note: You were cared for by a hospitalist during your hospital stay. Once you are discharged, your primary care physician will handle any further medical issues. Please note that NO REFILLS for any discharge medications will be authorized once you are discharged, as it is imperative that you return to your primary care physician (or establish a relationship with a primary care physician if you do not have one) for your post hospital discharge needs so that they can reassess your need for medications and monitor your lab values.  Total Time spent coordinating discharge including counseling,  education and face to face time equals 35 minutes.  SignedOren Binet 03/17/2016 6:08 PM

## 2016-03-22 ENCOUNTER — Encounter: Payer: Self-pay | Admitting: Physician Assistant

## 2016-03-22 DIAGNOSIS — R55 Syncope and collapse: Secondary | ICD-10-CM | POA: Diagnosis not present

## 2016-03-22 DIAGNOSIS — F900 Attention-deficit hyperactivity disorder, predominantly inattentive type: Secondary | ICD-10-CM | POA: Diagnosis not present

## 2016-03-23 ENCOUNTER — Ambulatory Visit (INDEPENDENT_AMBULATORY_CARE_PROVIDER_SITE_OTHER): Payer: Medicare Other

## 2016-03-23 DIAGNOSIS — R55 Syncope and collapse: Secondary | ICD-10-CM

## 2016-04-02 DIAGNOSIS — Z23 Encounter for immunization: Secondary | ICD-10-CM | POA: Diagnosis not present

## 2016-04-13 ENCOUNTER — Encounter: Payer: Self-pay | Admitting: Internal Medicine

## 2016-04-13 ENCOUNTER — Ambulatory Visit (INDEPENDENT_AMBULATORY_CARE_PROVIDER_SITE_OTHER): Payer: Medicare Other | Admitting: Internal Medicine

## 2016-04-13 VITALS — BP 120/70 | HR 99 | Ht 72.0 in | Wt 160.6 lb

## 2016-04-13 DIAGNOSIS — R05 Cough: Secondary | ICD-10-CM | POA: Diagnosis not present

## 2016-04-13 DIAGNOSIS — R058 Other specified cough: Secondary | ICD-10-CM

## 2016-04-13 MED ORDER — ACETAMINOPHEN-CODEINE #3 300-30 MG PO TABS: ORAL_TABLET | ORAL | 0 refills | Status: DC

## 2016-04-13 MED ORDER — AZITHROMYCIN 250 MG PO TABS: ORAL_TABLET | ORAL | 0 refills | Status: DC

## 2016-04-13 MED ORDER — PREDNISONE 10 MG PO TABS: ORAL_TABLET | ORAL | 0 refills | Status: DC

## 2016-04-13 NOTE — Patient Instructions (Addendum)
zpak Prednisone 10 mg take  4 each am x 2 days,   2 each am x 2 days,  1 each am x 2 days and stop   When coughing take the higher dose of omeprazole Take 30- 60 min before your first and last meals of the day as per med calendar  And use the flutter valve as much as you can   See calendar for specific medication instructions and bring it back for each and every office visit for every healthcare provider you see.  Without it,  you may not receive the best quality medical care that we feel you deserve.  You will note that the calendar groups together  your maintenance  medications that are timed at particular times of the day.  Think of this as your checklist for what your doctor has instructed you to do until your next evaluation to see what benefit  there is  to staying on a consistent group of medications intended to keep you well.  The other group at the bottom is entirely up to you to use as you see fit  for specific symptoms that may arise between visits that require you to treat them on an as needed basis.  Think of this as your action plan or "what if" list.   Separating the top medications from the bottom group is fundamental to providing you adequate care going forward.    Please schedule a follow up visit in 3 months but call sooner if needed

## 2016-04-13 NOTE — Progress Notes (Signed)
Subjective:    Patient ID: [name], male    DOB: 1950/09/09,     MRN: [id number]    Brief patient profile:  37 yowm quit smoking in 1989 with pattern of pattern of bad cough esp with colds no change before quit or after eval by allergist in [name] referred to pulmonary clinic 08/21/2015 by [name] with "the worst ever cough " = lasted longer / more severe.  Prev w/u by [name] in 2009 with nl pfts.     History of Present Illness  08/21/2015 1st [facility] office visit/ [name]   Chief Complaint  Patient presents with  . Pulmonary Consult    Referred by [name]. Pt c/o cough x 1 month- prod with clear, sticky sputum. He has noticed cough is trigerred by talking and exertion. He states he "goes into spasms" 25-30 x per day. He states he has coughed until almost vomits.   usual spells x lifetime  Last  only a few weeks /this did not feel like a typical cold  At onset one month prior to OV   maint on prilosec 20 mg bid pre- onset  Already 4 abx / pred/ no better  Better at hs/ worse with talking  Sometimes ant cp diffuse fleeting p severe cough  rec The key to effective treatment for your cough is eliminating the non-stop cycle of cough you're stuck in long enough to let your airway heal completely and then see if there is anything still making you cough once you stop the cough suppression, but this should take no more than 5 days to figure out First take delsym two tsp every 12 hours and supplement if needed with  Tylenol #3  up to 1-2 every 4 hours to suppress the urge to cough at all or even clear your throat.   Once you have eliminated the cough for 3 straight days try reducing the Tylenol #3 first,  then the delsym as tolerated.   Prednisone 10 mg take  4 each am x 2 days,   2 each am x 2 days,  1 each am x 2 days and stop (this is to eliminate allergies and inflammation from coughing) Whenever coughing > prilosec 20 x 2 x 30 min x before bfast and supper - once  you are better x one week then change back to previous dose    GERD diet  Always cough into the flutter valve to prevent airway trauma    08/26/2015 acute extended ov/[name] re: persistent severe cough/ finishing bactrim rx today  Chief Complaint  Patient presents with  . Acute Visit    Pt states woke up with bruise on his left side on 08/25/14- does not recall any injury. He feels pain underneath the left arm. He states that he is having longer periods of time without cough, but cough is more severe when he does cough. He also c/o insomnia since last here.   never took more than one tylenol #3 - cough remains non productive/ denies excess/ purulent sputum or mucus plugs   Extensive bruising L chest wall with pain in L axilla with coughing  rec First take delsym two tsp every 12 hours and supplement if needed with  Tylenol #3  up to 1-2 every 4 hours to suppress the urge to cough at all or even clear your throat. Swallowing water or using ice chips/non mint and menthol containing candies (such as lifesavers or sugarless jolly ranchers) are  also effective.  You should rest your voice and avoid activities that you know make you cough. Once you have eliminated the cough for 3 straight days try reducing the Tylenol #3 first,  then the delsym as tolerated.   Whenever coughing > prilosec 20 x 2 x 30 min x before bfast and supper - once you are better x one week then change back to previous dose    GERD  Always cough into the flutter valve to prevent airway trauma  Xanax 0.5 mg at bedtime as needed  Sinus CT  > neg     09/02/2015  f/u ov/[name] re: cough since jan 1/ not resp to tylenol #3 x 2 q4, not taking ppi as rec  Chief Complaint  Patient presents with  . Follow-up    Pt c/o continued cough with clear mucus that is more present when pt is in any kind of reclining position. Pt denies wheeze/CP/tightness. Pt reports that once he starts coughing he begins to have "spasms" that last several minutes  and often require sips of water and hard candy to resolve. Pt is very concerned and per wife is getting depressed. Pt also c/o frequents "sweats" and low grade fevers.   sense of something stuck at suprasternal notch and if just coughs hard enough it will come up / cp resolved  New night sweats since 2/26 when temp of 100 per wife but no fever since. rec Please remember to go to the   x-ray department downstairs for your tests - we will call you with the results when they are available. Avoid speaking/ sleep in recliner to prevent coughing fits and use the flutter on max resistance as much as possible Prilosec 20 mg x 2 Take 30- 60 min before your first and last meals of the day and Pepcid ac 20 mg at bedtime For drainage / throat tickle try take CHLORPHENIRAMINE  4 mg - take one every 4 hours as needed - available over the counter- may cause drowsiness so start with just a bedtime dose or two and see how you tolerate it before trying in daytime   Depomedrol 120 mg IM today  If not better w/in a few days > gabapentin 100 mg three times a day with meals  Add cxr c/w pna > rx levaquin    Admit date: 09/04/2015 Discharge date: 09/09/2015  Principal Problem:  CAP (community acquired pneumonia) Active Problems:  HLD (hyperlipidemia)  Depression  ADD (attention deficit disorder)  G E R D  Hyperparathyroidism, primary, s/p MI parathyroidectomy 7/18, 3.1 gm adenoma  Upper airway cough syndrome  Acute respiratory distress (HCC)  Barrett's esophagus  Dyspnea on exertion  Influenza  Cough    09/12/2015  Post hosp f/u ov/[name] re:  Influenza pna/ cough since Jan 2017 /02 dep since d/c on neurontin 100 tid  Chief Complaint  Patient presents with  . Follow-up    Pt c/o continued SOB and labored breathing, still some cough - which improved while in hospital - but is now coming back some, and some possible thrush mouth/tongue. Pt denies CP/tightness. Pt is on O2 more frequently and would  like to discuss getting a POC.   Still feels sensation of too much throat mucus but no excess production/ just dry hacking - all fever/ sweats resolved  Was d/c on pulmicort neb but too expendsive so did not purchase  rec Clotrimazole troche 10 mg four x daily x 3 days and  Stop (should heal your tongue )  Stop tussionex to see if bladder function better  02 is 2lpm bedtime only  Take delsym two tsp every 12 hours with flutter and supplement if needed with  Tylenol #3  up to 2 every 4 hours to suppress the urge to cough. Swallowing water or using ice chips/non mint and menthol containing candies (such as lifesavers or sugarless jolly ranchers) are also effective.  You should rest your voice and avoid activities that you know make you cough. Once you have eliminated the cough for 3 straight days try reducing the Tylenol #3 first,  then the delsym as tolerated. See [name] w/in 2 weeks(or first available after that)  with all your medications > did not do as "all better"   10/02/2015  Acute extended  ov/[name] re: recurrent cough  Chief Complaint  Patient presents with  . Acute Visit    Cough had completely resolved, then developed "new cough" 6 days ago. This cough is prod with large amounts of clear sputum.   eval by [name]  09/24/15 because still tongue was irritated reporting per notes sore throat and hoarseness > erythema of VC's only  And nor recs - after this pt reports no clearing throat or coughing  while on delsym  Then stopped neurontin due to side effects and cough worse since, though convinced the cough flared before he stopped it, this part of hx is unclear but now cc  recurrent cough more upright than lying down  Using nice lozenges/ sporadic daytime cough , min productive mucoid sputum/ no flutter valve in hand   >>delsym /tylenol #3 , zyrtec   11/07/2015 Follow up ; Recurrent Cough  Pt returns for 6 week follow up .  We reviewed all his medications organize them into a medication  count with patient education. Neck 5 appears he is take his medications correctly Stopped delsym , tylenol #3 , felt it did not help and memory was being affected.  Cough is much better.  Breathing seems to be improved as well.  Patient has been having some difficulty with muscle weakness and involuntary movements of his legs or arms. He was admitted and seen by neurology. MRI of the brain and spine were unrevealing. TSH was mildly abnormal at 4.71. Patient has ongoing follow-up with neurology and outpatient setting.. rec Follow med calendar closely and bring to each visit.   04/13/2016 acute extended ov/Kaycie Pegues re: recurrent cough in setting of uri/ no med calendar / no tylenol #3 Chief Complaint  Patient presents with  . Acute Visit    pt c/o prod cough with clear to yellow mucus, wheezing, chest discomfort & postnasal drip X4d. denies fever/sweats/chills.       Thinks he caught a cold from grand daugher/ hacking cough no sob/ some nasal congestion - had been doing well on no chronic resp rx  - only on gerd rx = omeprazole 20 mg bid (down from 40 from last ov)  No obvious day to day or daytime variability or assoc sob ormucus plugs or hemoptysis or cp or chest tightness,   or overt  hb symptoms. No unusual exp hx or h/o childhood pna/ asthma or knowledge of premature birth.  Sleeping ok without nocturnal  or early am exacerbation  of respiratory  c/o's or need for noct saba. Also denies any obvious fluctuation of symptoms with weather or environmental changes or other aggravating or alleviating factors except as outlined above   Current Medications, Allergies, Complete Past Medical History, Past Surgical History, Family  History, and Social History were reviewed in [employer] record.  ROS  The following are not active complaints unless bolded sore throat, dysphagia, dental problems, itching, sneezing,  nasal congestion or excess/ purulent secretions, ear ache,    fever, chills, sweats, unintended wt loss, classically pleuritic or exertional cp,  orthopnea pnd or leg swelling, presyncope, palpitations, abdominal pain, anorexia, nausea, vomiting, diarrhea  or change in bowel or bladder habits, change in stools or urine, dysuria,hematuria,  rash, arthralgias, visual complaints, headache, numbness, weakness or ataxia or problems with walking or coordination,  change in mood/affect or memory.                    Objective:   Physical Exam  Elderly wm   Wt Readings from Last 3 Encounters:  04/13/16 160 lb 9.6 oz (72.8 kg)  03/17/16 158 lb (71.7 kg)  11/07/15 150 lb (68 kg)    Vital signs reviewed  - - Note on arrival 02 sats  99% on RA      HEENT: nl dentition, turbinates, - oropharynx pristine.  Nl external ear canals without cough reflex   NECK :  without JVD/Nodes/TM/ nl carotid upstrokes bilaterally   LUNGS: no acc muscle use,  Nl contour chest with minimal insp/exp rhonchi and mostly pseudowheeze  CV:  RRR  no s3 or murmur or increase in P2, tr  edema   ABD:  soft and nontender with nl inspiratory excursion in the supine position. No bruits or organomegaly, bowel sounds nl  MS:  ext warm without deformities, calf tenderness, cyanosis or clubbing No obvious joint restrictions or tremors   SKIN: warm and dry with no lesions   NEURO:  alert, approp, nl sensorium with  no motor deficits                Assessment & Plan:

## 2016-04-14 ENCOUNTER — Encounter: Payer: Self-pay | Admitting: Internal Medicine

## 2016-04-14 NOTE — Assessment & Plan Note (Signed)
Cyclical cough rx  [id number] >>>  - CT sinus 08/29/2015 neg  - Seen 09/24/15 by Constance Holster cc sore throat and hoarseness > laryngoscopy c/w erythema VCs only - neuorontin added 09/02/15 > stopped neurontin 09/26/15 > cough worse see ov   10/02/2015  - CT chest 12/29/15 stable 5 mm nodules > no f/u indicated   Flare in setting of uri. Of the three most common causes of chronic cough, only one (GERD)  can actually cause the other two (asthma and post nasal drip syndrome)  and perpetuate the cylce of cough inducing airway trauma, inflammation, heightened sensitivity to reflux which is prompted by the cough itself via a cyclical mechanism.    This may partially respond to steroids and look like asthma and post nasal drainage but never erradicated completely unless the cough and the secondary reflux are eliminated, preferably both at the same time.  While not intuitively obvious, many patients with chronic low grade reflux do not cough until there is a secondary insult that disturbs the protective epithelial barrier and exposes sensitive nerve endings.  This can be viral (as is probably the case here) or direct physical injury such as with an endotracheal tube.   The point is that once this occurs, it is difficult to eliminate using anything but a maximally effective acid suppression regimen at least in the short run, accompanied by an appropriate diet to address non acid GERD.   Rec max rx for gerd/ diet/ flutter/ tylenlol #3 and emprical zpak for now and f/u if not 100% back to baseline in 2 weeks  I had an extended discussion with the patient reviewing all relevant studies completed to date and  lasting 15 to 20 minutes of a 25 minute visit    Each maintenance medication was reviewed in detail including most importantly the difference between maintenance and prns and under what circumstances the prns are to be triggered using an action plan format that is not reflected in the computer generated alphabetically  organized AVS but trather by a customized med calendar that reflects the AVS meds with confirmed 100% correlation.   Please see instructions for details which were reviewed in writing and the patient given a copy highlighting the part that I personally wrote and discussed at today's ov.

## 2016-04-26 DIAGNOSIS — J309 Allergic rhinitis, unspecified: Secondary | ICD-10-CM | POA: Diagnosis not present

## 2016-05-10 ENCOUNTER — Ambulatory Visit (INDEPENDENT_AMBULATORY_CARE_PROVIDER_SITE_OTHER)
Admission: RE | Admit: 2016-05-10 | Discharge: 2016-05-10 | Disposition: A | Discharge status: Home / Self Care | Payer: Medicare Other | Source: Ambulatory Visit | Attending: Adult Health | Admitting: Adult Health

## 2016-05-10 ENCOUNTER — Encounter: Payer: Self-pay | Admitting: Adult Health

## 2016-05-10 ENCOUNTER — Ambulatory Visit (INDEPENDENT_AMBULATORY_CARE_PROVIDER_SITE_OTHER): Payer: Medicare Other | Admitting: Adult Health

## 2016-05-10 VITALS — BP 100/60 | HR 87 | Temp 98.3°F | Ht 72.0 in | Wt 155.0 lb

## 2016-05-10 DIAGNOSIS — R079 Chest pain, unspecified: Secondary | ICD-10-CM | POA: Diagnosis not present

## 2016-05-10 DIAGNOSIS — R05 Cough: Secondary | ICD-10-CM | POA: Diagnosis not present

## 2016-05-10 DIAGNOSIS — R059 Cough, unspecified: Secondary | ICD-10-CM

## 2016-05-10 DIAGNOSIS — R058 Other specified cough: Secondary | ICD-10-CM

## 2016-05-10 DIAGNOSIS — R0602 Shortness of breath: Secondary | ICD-10-CM | POA: Diagnosis not present

## 2016-05-10 MED ORDER — PREDNISONE 10 MG PO TABS: ORAL_TABLET | ORAL | 0 refills | Status: DC

## 2016-05-10 MED ORDER — ACETAMINOPHEN-CODEINE #3 300-30 MG PO TABS: ORAL_TABLET | ORAL | 0 refills | Status: DC

## 2016-05-10 NOTE — Assessment & Plan Note (Signed)
UACS flare  Extensive workup with neg CT sinus , ENT referall, aggressive cough control /GERD/AR tx. CT chest unrevealinig   Plan  Patient Instructions  Chest xray today  Prednisone taper over next week .  Continue on cough regimen .  follow up Dr. Melvyn Novas  In 6 weeks and As needed   Use sips of water to soothe throat tickle  Please contact office for sooner follow up if symptoms do not improve or worsen or seek emergency care

## 2016-05-10 NOTE — Progress Notes (Signed)
Subjective:    Patient ID: [name], male    DOB: 30-Jan-1951,     MRN: [id number]    Brief patient profile:  77 yowm quit smoking in 1989 with pattern of pattern of bad cough esp with colds no change before quit or after eval by allergist in [name] referred to pulmonary clinic 08/21/2015 by [name] with "the worst ever cough " = lasted longer / more severe.  Prev w/u by [name] in 2009 with nl pfts.     History of Present Illness  08/21/2015 1st [facility] office visit/ [facility]   Chief Complaint  Patient presents with  . Pulmonary Consult    Referred by [name]. Pt c/o cough x 1 month- prod with clear, sticky sputum. He has noticed cough is trigerred by talking and exertion. He states he "goes into spasms" 25-30 x per day. He states he has coughed until almost vomits.   usual spells x lifetime  Last  only a few weeks /this did not feel like a typical cold  At onset one month prior to OV   maint on prilosec 20 mg bid pre- onset  Already 4 abx / pred/ no better  Better at hs/ worse with talking  Sometimes ant cp diffuse fleeting p severe cough  rec The key to effective treatment for your cough is eliminating the non-stop cycle of cough you're stuck in long enough to let your airway heal completely and then see if there is anything still making you cough once you stop the cough suppression, but this should take no more than 5 days to figure out First take delsym two tsp every 12 hours and supplement if needed with  Tylenol #3  up to 1-2 every 4 hours to suppress the urge to cough at all or even clear your throat.   Once you have eliminated the cough for 3 straight days try reducing the Tylenol #3 first,  then the delsym as tolerated.   Prednisone 10 mg take  4 each am x 2 days,   2 each am x 2 days,  1 each am x 2 days and stop (this is to eliminate allergies and inflammation from coughing) Whenever coughing > prilosec 20 x 2 x 30 min x before bfast and supper - once  you are better x one week then change back to previous dose    GERD diet  Always cough into the flutter valve to prevent airway trauma    08/26/2015 acute extended ov/[facility] re: persistent severe cough/ finishing bactrim rx today  Chief Complaint  Patient presents with  . Acute Visit    Pt states woke up with bruise on his left side on 08/25/14- does not recall any injury. He feels pain underneath the left arm. He states that he is having longer periods of time without cough, but cough is more severe when he does cough. He also c/o insomnia since last here.   never took more than one tylenol #3 - cough remains non productive/ denies excess/ purulent sputum or mucus plugs   Extensive bruising L chest wall with pain in L axilla with coughing  rec First take delsym two tsp every 12 hours and supplement if needed with  Tylenol #3  up to 1-2 every 4 hours to suppress the urge to cough at all or even clear your throat. Swallowing water or using ice chips/non mint and menthol containing candies (such as lifesavers or sugarless jolly ranchers) are  also effective.  You should rest your voice and avoid activities that you know make you cough. Once you have eliminated the cough for 3 straight days try reducing the Tylenol #3 first,  then the delsym as tolerated.   Whenever coughing > prilosec 20 x 2 x 30 min x before bfast and supper - once you are better x one week then change back to previous dose    GERD  Always cough into the flutter valve to prevent airway trauma  Xanax 0.5 mg at bedtime as needed  Sinus CT  > neg     09/02/2015  f/u ov/[facility] re: cough since jan 1/ not resp to tylenol #3 x 2 q4, not taking ppi as rec  Chief Complaint  Patient presents with  . Follow-up    Pt c/o continued cough with clear mucus that is more present when pt is in any kind of reclining position. Pt denies wheeze/CP/tightness. Pt reports that once he starts coughing he begins to have "spasms" that last several minutes  and often require sips of water and hard candy to resolve. Pt is very concerned and per wife is getting depressed. Pt also c/o frequents "sweats" and low grade fevers.   sense of something stuck at suprasternal notch and if just coughs hard enough it will come up / cp resolved  New night sweats since 2/26 when temp of 100 per wife but no fever since. rec Please remember to go to the   x-ray department downstairs for your tests - we will call you with the results when they are available. Avoid speaking/ sleep in recliner to prevent coughing fits and use the flutter on max resistance as much as possible Prilosec 20 mg x 2 Take 30- 60 min before your first and last meals of the day and Pepcid ac 20 mg at bedtime For drainage / throat tickle try take CHLORPHENIRAMINE  4 mg - take one every 4 hours as needed - available over the counter- may cause drowsiness so start with just a bedtime dose or two and see how you tolerate it before trying in daytime   Depomedrol 120 mg IM today  If not better w/in a few days > gabapentin 100 mg three times a day with meals  Add cxr c/w pna > rx levaquin    Admit date: 09/04/2015 Discharge date: 09/09/2015  Principal Problem:  CAP (community acquired pneumonia) Active Problems:  HLD (hyperlipidemia)  Depression  ADD (attention deficit disorder)  G E R D  Hyperparathyroidism, primary, s/p MI parathyroidectomy 7/18, 3.1 gm adenoma  Upper airway cough syndrome  Acute respiratory distress (HCC)  Barrett's esophagus  Dyspnea on exertion  Influenza  Cough    09/12/2015  Post hosp f/u ov/[facility] re:  Influenza pna/ cough since Jan 2017 /02 dep since d/c on neurontin 100 tid  Chief Complaint  Patient presents with  . Follow-up    Pt c/o continued SOB and labored breathing, still some cough - which improved while in hospital - but is now coming back some, and some possible thrush mouth/tongue. Pt denies CP/tightness. Pt is on O2 more frequently and would  like to discuss getting a POC.   Still feels sensation of too much throat mucus but no excess production/ just dry hacking - all fever/ sweats resolved  Was d/c on pulmicort neb but too expendsive so did not purchase  rec Clotrimazole troche 10 mg four x daily x 3 days and  Stop (should heal your tongue )  Stop tussionex to see if bladder function better  02 is 2lpm bedtime only  Take delsym two tsp every 12 hours with flutter and supplement if needed with  Tylenol #3  up to 2 every 4 hours to suppress the urge to cough. Swallowing water or using ice chips/non mint and menthol containing candies (such as lifesavers or sugarless jolly ranchers) are also effective.  You should rest your voice and avoid activities that you know make you cough. Once you have eliminated the cough for 3 straight days try reducing the Tylenol #3 first,  then the delsym as tolerated. See Tammy NP w/in 2 weeks(or first available after that)  with all your medications > did not do as "all better"   10/02/2015  Acute extended  ov/[facility] re: recurrent cough  Chief Complaint  Patient presents with  . Acute Visit    Cough had completely resolved, then developed "new cough" 6 days ago. This cough is prod with large amounts of clear sputum.   eval by Constance Holster  09/24/15 because still tongue was irritated reporting per notes sore throat and hoarseness > erythema of VC's only  And nor recs - after this pt reports no clearing throat or coughing  while on delsym  Then stopped neurontin due to side effects and cough worse since, though convinced the cough flared before he stopped it, this part of hx is unclear but now cc  recurrent cough more upright than lying down  Using nice lozenges/ sporadic daytime cough , min productive mucoid sputum/ no flutter valve in hand   >>delsym /tylenol #3 , zyrtec   11/07/2015 Follow up ; Recurrent Cough  Pt returns for 6 week follow up .  We reviewed all his medications organize them into a medication  count with patient education. Neck 5 appears he is take his medications correctly Stopped delsym , tylenol #3 , felt it did not help and memory was being affected.  Cough is much better.  Breathing seems to be improved as well.  Patient has been having some difficulty with muscle weakness and involuntary movements of his legs or arms. He was admitted and seen by neurology. MRI of the brain and spine were unrevealing. TSH was mildly abnormal at 4.71. Patient has ongoing follow-up with neurology and outpatient setting.. rec Follow med calendar closely and bring to each visit.   04/13/2016 acute extended ov/[facility] re: recurrent cough in setting of uri/ no med calendar / no tylenol #3 Chief Complaint  Patient presents with  . Acute Visit    pt c/o prod cough with clear to yellow mucus, wheezing, chest discomfort & postnasal drip X4d. denies fever/sweats/chills.     Thinks he caught a cold from grand daugher/ hacking cough no sob/ some nasal congestion - had been doing well on no chronic resp rx  - only on gerd rx = omeprazole 20 mg bid (down from 40 from last ov) >>zpk and pred taper    05/10/2016 Acute OV : Cough  Pt presents for an acute office visit.  Complains of 1 week of cough flare. Says cough is mainly dry, worse at night . Has coughing fits so bad that he could throw up .  Is using delsym and tylenol # 3 . Seen 1 month ago with flare , tx w/ pred and Zpak that helped. CT chest in 03/2016 w/ no acute process. Unchanged nodule since 2014 in [facility].  Denies fever, discolored mucus , chest pain, orthopnea, or edema.  Current Medications, Allergies, Complete Past Medical History, Past Surgical History, Family History, and Social History were reviewed in [employer] record.  ROS  The following are not active complaints unless bolded sore throat, dysphagia, dental problems, itching, sneezing,  nasal congestion or excess/ purulent secretions, ear ache,   fever, chills,  sweats, unintended wt loss, classically pleuritic or exertional cp,  orthopnea pnd or leg swelling, presyncope, palpitations, abdominal pain, anorexia, nausea, vomiting, diarrhea  or change in bowel or bladder habits, change in stools or urine, dysuria,hematuria,  rash, arthralgias, visual complaints, headache, numbness, weakness or ataxia or problems with walking or coordination,  change in mood/affect or memory.                    Objective:   Physical Exam  Elderly wm thin /frail appearing  Vitals:   05/10/16 0926  BP: 100/60  Pulse: 87  Temp: 98.3 F (36.8 C)  TempSrc: Oral  SpO2: 97%  Weight: 155 lb (70.3 kg)  Height: 6' (1.829 m)  Body mass index is 21.02 kg/m.    Vital signs reviewed  - - Note on arrival 02 sats  97%, on RA     HEENT: nl dentition, turbinates, - oropharynx pristine.  Nl external ear canals without cough reflex   NECK :  without JVD/Nodes/TM/ nl carotid upstrokes bilaterally   LUNGS: no acc muscle use,  Nl contour chest with minimal insp/exp rhonchi   CV:  RRR  no s3 or murmur or increase in P2, tr  edema   ABD:  soft and nontender with nl inspiratory excursion in the supine position. No bruits or organomegaly, bowel sounds nl  MS:  ext warm without deformities, calf tenderness, cyanosis or clubbing No obvious joint restrictions or tremors   SKIN: warm and dry with no lesions   NEURO:  alert, approp, nl sensorium with  no motor deficits      Tammy Parrett NP-C  [facility]  05/10/2016

## 2016-05-10 NOTE — Patient Instructions (Addendum)
Chest xray today  Prednisone taper over next week .  Continue on cough regimen .  follow up Dr. Melvyn Novas  In 6 weeks and As needed   Use sips of water to soothe throat tickle  Please contact office for sooner follow up if symptoms do not improve or worsen or seek emergency care

## 2016-05-10 NOTE — Progress Notes (Signed)
Chart and office note reviewed in detail  > agree with a/p as outlined    

## 2016-05-13 ENCOUNTER — Telehealth: Payer: Self-pay | Admitting: Internal Medicine

## 2016-05-13 NOTE — Telephone Encounter (Signed)
Spoke with pt. He is aware of TP's response. Nothing further was needed.

## 2016-05-13 NOTE — Telephone Encounter (Signed)
No that is an interpretation per the radiologist. It is possible he has some mild changes from previous smoking however lung function test in past did not show evidence of COPD , that is the gold standard to dx COPD . CT chest in 03/2016 showed clear lungs except for mild scarring in top of lungs.

## 2016-05-13 NOTE — Telephone Encounter (Signed)
Notes Recorded by Melvenia Needles, NP on 05/10/2016 at 3:43 PM EST No sign of PNA  Cont w/ ov recs Please contact office for sooner follow up if symptoms do not improve or worsen or seek emergency care  ----------------------------------------------- Spoke with pt. He is aware of results. States that he looked at the report on MyChart. He saw a diagnosis of Emphysema. Wants to know if this is a confirmed diagnosis.  TP - please advise. Thanks.

## 2016-05-26 ENCOUNTER — Ambulatory Visit (INDEPENDENT_AMBULATORY_CARE_PROVIDER_SITE_OTHER): Payer: Medicare Other | Admitting: Internal Medicine

## 2016-05-26 ENCOUNTER — Encounter: Payer: Self-pay | Admitting: Internal Medicine

## 2016-05-26 VITALS — BP 108/70 | HR 93 | Ht 72.0 in | Wt 155.6 lb

## 2016-05-26 DIAGNOSIS — R05 Cough: Secondary | ICD-10-CM | POA: Diagnosis not present

## 2016-05-26 DIAGNOSIS — R058 Other specified cough: Secondary | ICD-10-CM

## 2016-05-26 LAB — NITRIC OXIDE: NITRIC OXIDE: 14

## 2016-05-26 MED ORDER — GABAPENTIN 100 MG PO CAPS: ORAL_CAPSULE | ORAL | 2 refills | Status: DC

## 2016-05-26 NOTE — Patient Instructions (Addendum)
Change prilosec to 40 mg Take 30- 60 min before your first and last meals of the day automatically and do not taper   Gabapentin 100 mg with bfast and supper only   For cough use mucinex dm up to 1200 mg every 12 hours and as much flutter valve as you can  Keep either candy, ice chips or sips of water handy to use when you swallow  See Tammy NP w/in 2 weeks with all your medications, even over the counter meds, separated in two separate bags, the ones you take no matter what vs the ones you stop once you feel better and take only as needed when you feel you need them.   Tammy  will generate for you a new user friendly medication calendar that will put [place] all on the same page re: your medication use.    She will set you up for immunology evaluation if not improving on this regimen and evaluation at [facility] for irritable larynx syndrome

## 2016-05-26 NOTE — Assessment & Plan Note (Signed)
Cyclical cough rx  [id number] >>>  - CT sinus 08/29/2015 neg  - Seen 09/24/15 by Constance Holster cc sore throat and hoarseness > laryngoscopy c/w erythema VCs only - neuorontin added 09/02/15 > stopped neurontin 09/26/15 > cough worse see ov   10/02/2015  - CT chest 12/29/15 stable 5 mm nodules > no f/u indicated  - FENO 05/26/2016  =   14 - Spirometry 05/26/2016  wnl including f/v curve s rx prior   -  rechallenge with gabapentin 100 mg bid 05/26/2016 >>>     I had an extended discussion with the patient reviewing all relevant studies completed to date and  lasting 25 minutes of a 40  minute acute visit  re refractory / non-specific but potentially very serious pulmonary symptoms of unknown etiology.  1) reviewed entire hx from the beginning and turns out the cough never resolved x 40 years  2) this is most c/w irrititable larynx, not a lung problem despite smoking hx and "copd" on cxr  3) no evidence of immune/ allergy issue but fine with me to send to Dr Neldon Mc for a second opinion if we first prove he can't be maintained on an rx directed at irritable larynx to include max rx directed at gerd and gradually building up gabapentin if tol from cns perspective and if not consider [facility]/ voice center eval  4)  Each maintenance medication was reviewed in detail including most importantly the difference between maintenance and prns and under what circumstances the prns are to be triggered using an action plan format that is not reflected in the computer generated alphabetically organized AVS.    Please see instructions for details which were reviewed in writing and the patient given a copy highlighting the part that I personally wrote and discussed at today's ov.

## 2016-05-26 NOTE — Progress Notes (Signed)
Subjective:    Patient ID: [name], male    DOB: [birth year],     MRN: [id number]    Brief patient profile:  16 yowm quit smoking in 1989 with pattern of pattern of bad cough esp with colds no change before quit or after eval by allergist in [name] referred to pulmonary clinic 08/21/2015 by [name] with "the worst ever cough " = lasted longer / more severe.  Prev w/u by [name] in 2009 with nl pfts.   As of 05/26/2016  Amended hx never 100% gone x 1980s    History of Present Illness  08/21/2015 1st [facility] office visit/ [name]   Chief Complaint  Patient presents with  . Pulmonary Consult    Referred by [name]. Pt c/o cough x 1 month- prod with clear, sticky sputum. He has noticed cough is trigerred by talking and exertion. He states he "goes into spasms" 25-30 x per day. He states he has coughed until almost vomits.   usual spells x lifetime  Last  only a few weeks /this did not feel like a typical cold  At onset one month prior to OV   maint on prilosec 20 mg bid pre- onset  Already 4 abx / pred/ no better  Better at hs/ worse with talking  Sometimes ant cp diffuse fleeting p severe cough  rec The key to effective treatment for your cough is eliminating the non-stop cycle of cough   Once you have eliminated the cough for 3 straight days try reducing the Tylenol #3 first,  then the delsym as tolerated.   Prednisone 10 mg take  4 each am x 2 days,   2 each am x 2 days,  1 each am x 2 days and stop (this is to eliminate allergies and inflammation from coughing) Whenever coughing > prilosec 20 x 2 x 30 min x before bfast and supper - once you are better x one week then change back to previous dose    GERD diet  Always cough into the flutter valve to prevent airway trauma    08/26/2015 acute extended ov/[name] re: persistent severe cough/ finishing bactrim rx today  Chief Complaint  Patient presents with  . Acute Visit    Pt states woke up with bruise on  his left side on 08/25/14- does not recall any injury. He feels pain underneath the left arm. He states that he is having longer periods of time without cough, but cough is more severe when he does cough. He also c/o insomnia since last here.   never took more than one tylenol #3 - cough remains non productive/ denies excess/ purulent sputum or mucus plugs   Extensive bruising L chest wall with pain in L axilla with coughing  rec First take delsym two tsp every 12 hours and supplement if needed with  Tylenol #3  up to 1-2 every 4 hours to suppress the urge to cough at all or even clear your throat. Swallowing water or using ice chips/non mint and menthol containing candies (such as lifesavers or sugarless jolly ranchers) are also effective.  You should rest your voice and avoid activities that you know make you cough. Once you have eliminated the cough for 3 straight days try reducing the Tylenol #3 first,  then the delsym as tolerated.   Whenever coughing > prilosec 20 x 2 x 30 min x before bfast and supper - once you are better x one week  then change back to previous dose    GERD  Always cough into the flutter valve to prevent airway trauma  Xanax 0.5 mg at bedtime as needed  Sinus CT  > neg     09/02/2015  f/u ov/[name] re: cough since Jul 06 2015 / not resp to tylenol #3 x 2 q4, not taking ppi as rec  Chief Complaint  Patient presents with  . Follow-up    Pt c/o continued cough with clear mucus that is more present when pt is in any kind of reclining position. Pt denies wheeze/CP/tightness. Pt reports that once he starts coughing he begins to have "spasms" that last several minutes and often require sips of water and hard candy to resolve. Pt is very concerned and per wife is getting depressed. Pt also c/o frequents "sweats" and low grade fevers.   sense of something stuck at suprasternal notch and if just coughs hard enough it will come up / cp resolved  New night sweats since 2/26 when temp of  100 per wife but no fever since. rec Please remember to go to the   x-ray department downstairs for your tests - we will call you with the results when they are available. Avoid speaking/ sleep in recliner to prevent coughing fits and use the flutter on max resistance as much as possible Prilosec 20 mg x 2 Take 30- 60 min before your first and last meals of the day and Pepcid ac 20 mg at bedtime For drainage / throat tickle try take CHLORPHENIRAMINE  4 mg - take one every 4 hours as needed - available over the counter- may cause drowsiness so start with just a bedtime dose or two and see how you tolerate it before trying in daytime   Depomedrol 120 mg IM today  If not better w/in a few days > gabapentin 100 mg three times a day with meals  Add cxr c/w pna > rx levaquin    Admit date: 09/04/2015 Discharge date: 09/09/2015  Principal Problem:  CAP (community acquired pneumonia) Active Problems:  HLD (hyperlipidemia)  Depression  ADD (attention deficit disorder)  G E R D  Hyperparathyroidism, primary, s/p MI parathyroidectomy 7/18, 3.1 gm adenoma  Upper airway cough syndrome  Acute respiratory distress (HCC)  Barrett's esophagus  Dyspnea on exertion  Influenza  Cough    09/12/2015  Post hosp f/u ov/[name] re:  Influenza pna/ cough since Jan 2017 /02 dep since d/c on neurontin 100 tid  Chief Complaint  Patient presents with  . Follow-up    Pt c/o continued SOB and labored breathing, still some cough - which improved while in hospital - but is now coming back some, and some possible thrush mouth/tongue. Pt denies CP/tightness. Pt is on O2 more frequently and would like to discuss getting a POC.   Still feels sensation of too much throat mucus but no excess production/ just dry hacking - all fever/ sweats resolved  Was d/c on pulmicort neb but too expendsive so did not purchase  rec Clotrimazole troche 10 mg four x daily x 3 days and  Stop (should heal your tongue )  Stop  tussionex to see if bladder function better  02 is 2lpm bedtime only  Take delsym two tsp every 12 hours with flutter and supplement if needed with  Tylenol #3  up to 2 every 4 hours to suppress the urge to cough. Swallowing water or using ice chips/non mint and menthol containing candies (such as lifesavers or  sugarless jolly ranchers) are also effective.  You should rest your voice and avoid activities that you know make you cough. Once you have eliminated the cough for 3 straight days try reducing the Tylenol #3 first,  then the delsym as tolerated. See Tammy NP w/in 2 weeks(or first available after that)  with all your medications > did not do as "all better"   10/02/2015  Acute extended  ov/Nora Sabey re: recurrent cough  Chief Complaint  Patient presents with  . Acute Visit    Cough had completely resolved, then developed "new cough" 6 days ago. This cough is prod with large amounts of clear sputum.   eval by Constance Holster  09/24/15 because still tongue was irritated reporting per notes sore throat and hoarseness > erythema of VC's only  And nor recs - after this pt reports no clearing throat or coughing  while on delsym  Then stopped neurontin due to side effects and cough worse since, though convinced the cough flared before he stopped it, this part of hx is unclear but now cc  recurrent cough more upright than lying down  Using nice lozenges/ sporadic daytime cough , min productive mucoid sputum/ no flutter valve in hand   >>delsym /tylenol #3 , zyrtec   11/07/2015 NPFollow up ; Recurrent Cough  Pt returns for 6 week follow up .  We reviewed all his medications organize them into a medication count with patient education. Neck 5 appears he is take his medications correctly Stopped delsym , tylenol #3 , felt it did not help and memory was being affected.  Cough is much better.  Breathing seems to be improved as well.  Patient has been having some difficulty with muscle weakness and involuntary movements  of his legs or arms. He was admitted and seen by neurology. MRI of the brain and spine were unrevealing. TSH was mildly abnormal at 4.71. Patient has ongoing follow-up with neurology and outpatient setting.. rec Follow med calendar closely and bring to each visit.   04/13/2016 acute extended ov/Winefred Hillesheim re: recurrent cough in setting of uri/ no med calendar / no tylenol #3 Chief Complaint  Patient presents with  . Acute Visit    pt c/o prod cough with clear to yellow mucus, wheezing, chest discomfort & postnasal drip X4d. denies fever/sweats/chills.    Thinks he caught a cold from grand daugher/ hacking cough no sob/ some nasal congestion - had been doing relatively  well on no chronic resp rx  - only on gerd rx = omeprazole 20 mg bid (down from 40 from last ov) >>zpk and pred taper    05/10/2016 NP  Acute OV : Cough only transiently improved p prev ov  Pt presents for an acute office visit.  Complains of 1 week of cough flare. Says cough is mainly dry, worse at night . Has coughing fits so bad that he could throw up  Is using delsym and tylenol # 3 . CT chest in 03/2016 w/ no acute process. Unchanged nodule since 2014 in [facility].  Rec Prednisone taper over next week .  Continue on cough regimen .  follow up Dr. Melvyn Novas  In 6 weeks and As needed   Use sips of water to soothe throat tickle     05/26/2016 acute extended ov/Kennley Schwandt re: cough x 40 years, worse x 1st nov  Chief Complaint  Patient presents with  . Acute Visit    Cough not improving much since last visit 05/10/16. Cough is prod with clear,  thick sputum. He is feeling very tired.    no longer coughing to vomiting but coughing day > noct, min mucus, no resp to delsym and tylenol #3 and thinks he needs his immune system checked out thought note most of the episodes x 40 years have been related to viral uri s def pna or sinus infections. Misunderstood instructions and tapered ppi even when still coughing  No longer coughing at hs or in am at  all until stirs around (if lies in bed for an hour in am no cough at all    No obvious day to day or daytime variability or assoc excess/ purulent sputum or mucus plugs or hemoptysis or cp or chest tightness, subjective wheeze or overt sinus or hb symptoms. No unusual exp hx or h/o childhood pna/ asthma or knowledge of premature birth.  Sleeping ok without nocturnal  or early am exacerbation  of respiratory  c/o's or need for noct saba. Also denies any obvious fluctuation of symptoms with weather or environmental changes or other aggravating or alleviating factors except as outlined above   Current Medications, Allergies, Complete Past Medical History, Past Surgical History, Family History, and Social History were reviewed in [employer] record.  ROS  The following are not active complaints unless bolded sore throat, dysphagia, dental problems, itching, sneezing,  nasal congestion or excess/ purulent secretions, ear ache,   fever, chills, sweats, unintended wt loss, classically pleuritic or exertional cp,  orthopnea pnd or leg swelling, presyncope, palpitations, abdominal pain, anorexia, nausea, vomiting, diarrhea  or change in bowel or bladder habits, change in stools or urine, dysuria,hematuria,  rash, arthralgias, visual complaints, headache, numbness, weakness or ataxia or problems with walking or coordination,  change in mood/affect or memory.                    Objective:   Physical Exam  Elderly wm thin nad    Vital signs reviewed  - - Note on arrival 02 sats  96%, on RA    Wt Readings from Last 3 Encounters:  05/26/16 155 lb 9.6 oz (70.6 kg)  05/10/16 155 lb (70.3 kg)  04/13/16 160 lb 9.6 oz (72.8 kg)        HEENT: nl dentition, turbinates, - oropharynx pristine.  Nl external ear canals without cough reflex   NECK :  without JVD/Nodes/TM/ nl carotid upstrokes bilaterally   LUNGS: no acc muscle use,  Nl contour chest clear to A and P with cough  early on inspiration   CV:  RRR  no s3 or murmur or increase in P2, tr  edema   ABD:  soft and nontender with nl inspiratory excursion in the supine position. No bruits or organomegaly, bowel sounds nl  MS:  ext warm without deformities, calf tenderness, cyanosis or clubbing No obvious joint restrictions or tremors   SKIN: warm and dry with no lesions   NEURO:  alert, approp, nl sensorium with  no motor deficits      I personally reviewed images and agree with radiology impression as follows:  CXR:   05/10/16 1. No acute cardiopulmonary disease or significant interval change. 2. Emphysema.

## 2016-06-14 ENCOUNTER — Encounter: Payer: Self-pay | Admitting: Adult Health

## 2016-06-14 ENCOUNTER — Ambulatory Visit (INDEPENDENT_AMBULATORY_CARE_PROVIDER_SITE_OTHER): Payer: Medicare Other | Admitting: Adult Health

## 2016-06-14 DIAGNOSIS — R05 Cough: Secondary | ICD-10-CM

## 2016-06-14 DIAGNOSIS — R058 Other specified cough: Secondary | ICD-10-CM

## 2016-06-14 NOTE — Assessment & Plan Note (Signed)
Doing well on cyclical cough regimen with gabapentin, mucinex DM , flutter and PPI , zyrtec and flonase   Plan  Patient Instructions  Follow med calendar closely and bring to each visit.  Follow up with Dr. Melvyn Novas  In 3 months and As needed

## 2016-06-14 NOTE — Progress Notes (Signed)
Chart and office note reviewed in detail  > agree with a/p as outlined    

## 2016-06-14 NOTE — Patient Instructions (Signed)
Follow med calendar closely and bring to each visit  Follow up with Dr. Wert  In 3 months and As needed    

## 2016-06-14 NOTE — Progress Notes (Signed)
Subjective:    Patient ID: [name], male    DOB: [birth year],     MRN: [id number]    Brief patient profile:  16 yowm quit smoking in 1989 with pattern of pattern of bad cough esp with colds no change before quit or after eval by allergist in [name] referred to pulmonary clinic 08/21/2015 by [name] with "the worst ever cough " = lasted longer / more severe.  Prev w/u by [name] in 2009 with nl pfts.   As of 05/26/2016  Amended hx never 100% gone x 1980s    History of Present Illness  08/21/2015 1st [facility] office visit/ [facility]   Chief Complaint  Patient presents with  . Pulmonary Consult    Referred by [name]. Pt c/o cough x 1 month- prod with clear, sticky sputum. He has noticed cough is trigerred by talking and exertion. He states he "goes into spasms" 25-30 x per day. He states he has coughed until almost vomits.   usual spells x lifetime  Last  only a few weeks /this did not feel like a typical cold  At onset one month prior to OV   maint on prilosec 20 mg bid pre- onset  Already 4 abx / pred/ no better  Better at hs/ worse with talking  Sometimes ant cp diffuse fleeting p severe cough  rec The key to effective treatment for your cough is eliminating the non-stop cycle of cough   Once you have eliminated the cough for 3 straight days try reducing the Tylenol #3 first,  then the delsym as tolerated.   Prednisone 10 mg take  4 each am x 2 days,   2 each am x 2 days,  1 each am x 2 days and stop (this is to eliminate allergies and inflammation from coughing) Whenever coughing > prilosec 20 x 2 x 30 min x before bfast and supper - once you are better x one week then change back to previous dose    GERD diet  Always cough into the flutter valve to prevent airway trauma    08/26/2015 acute extended ov/[facility] re: persistent severe cough/ finishing bactrim rx today  Chief Complaint  Patient presents with  . Acute Visit    Pt states woke up with bruise on  his left side on 08/25/14- does not recall any injury. He feels pain underneath the left arm. He states that he is having longer periods of time without cough, but cough is more severe when he does cough. He also c/o insomnia since last here.   never took more than one tylenol #3 - cough remains non productive/ denies excess/ purulent sputum or mucus plugs   Extensive bruising L chest wall with pain in L axilla with coughing  rec First take delsym two tsp every 12 hours and supplement if needed with  Tylenol #3  up to 1-2 every 4 hours to suppress the urge to cough at all or even clear your throat. Swallowing water or using ice chips/non mint and menthol containing candies (such as lifesavers or sugarless jolly ranchers) are also effective.  You should rest your voice and avoid activities that you know make you cough. Once you have eliminated the cough for 3 straight days try reducing the Tylenol #3 first,  then the delsym as tolerated.   Whenever coughing > prilosec 20 x 2 x 30 min x before bfast and supper - once you are better x one week  then change back to previous dose    GERD  Always cough into the flutter valve to prevent airway trauma  Xanax 0.5 mg at bedtime as needed  Sinus CT  > neg     09/02/2015  f/u ov/[facility] re: cough since Jul 06 2015 / not resp to tylenol #3 x 2 q4, not taking ppi as rec  Chief Complaint  Patient presents with  . Follow-up    Pt c/o continued cough with clear mucus that is more present when pt is in any kind of reclining position. Pt denies wheeze/CP/tightness. Pt reports that once he starts coughing he begins to have "spasms" that last several minutes and often require sips of water and hard candy to resolve. Pt is very concerned and per wife is getting depressed. Pt also c/o frequents "sweats" and low grade fevers.   sense of something stuck at suprasternal notch and if just coughs hard enough it will come up / cp resolved  New night sweats since 2/26 when temp of  100 per wife but no fever since. rec Please remember to go to the   x-ray department downstairs for your tests - we will call you with the results when they are available. Avoid speaking/ sleep in recliner to prevent coughing fits and use the flutter on max resistance as much as possible Prilosec 20 mg x 2 Take 30- 60 min before your first and last meals of the day and Pepcid ac 20 mg at bedtime For drainage / throat tickle try take CHLORPHENIRAMINE  4 mg - take one every 4 hours as needed - available over the counter- may cause drowsiness so start with just a bedtime dose or two and see how you tolerate it before trying in daytime   Depomedrol 120 mg IM today  If not better w/in a few days > gabapentin 100 mg three times a day with meals  Add cxr c/w pna > rx levaquin    Admit date: 09/04/2015 Discharge date: 09/09/2015  Principal Problem:  CAP (community acquired pneumonia) Active Problems:  HLD (hyperlipidemia)  Depression  ADD (attention deficit disorder)  G E R D  Hyperparathyroidism, primary, s/p MI parathyroidectomy 7/18, 3.1 gm adenoma  Upper airway cough syndrome  Acute respiratory distress (HCC)  Barrett's esophagus  Dyspnea on exertion  Influenza  Cough    09/12/2015  Post hosp f/u ov/[facility] re:  Influenza pna/ cough since Jan 2017 /02 dep since d/c on neurontin 100 tid  Chief Complaint  Patient presents with  . Follow-up    Pt c/o continued SOB and labored breathing, still some cough - which improved while in hospital - but is now coming back some, and some possible thrush mouth/tongue. Pt denies CP/tightness. Pt is on O2 more frequently and would like to discuss getting a POC.   Still feels sensation of too much throat mucus but no excess production/ just dry hacking - all fever/ sweats resolved  Was d/c on pulmicort neb but too expendsive so did not purchase  rec Clotrimazole troche 10 mg four x daily x 3 days and  Stop (should heal your tongue )  Stop  tussionex to see if bladder function better  02 is 2lpm bedtime only  Take delsym two tsp every 12 hours with flutter and supplement if needed with  Tylenol #3  up to 2 every 4 hours to suppress the urge to cough. Swallowing water or using ice chips/non mint and menthol containing candies (such as lifesavers or  sugarless jolly ranchers) are also effective.  You should rest your voice and avoid activities that you know make you cough. Once you have eliminated the cough for 3 straight days try reducing the Tylenol #3 first,  then the delsym as tolerated. See Tammy NP w/in 2 weeks(or first available after that)  with all your medications > did not do as "all better"   10/02/2015  Acute extended  ov/[facility] re: recurrent cough  Chief Complaint  Patient presents with  . Acute Visit    Cough had completely resolved, then developed "new cough" 6 days ago. This cough is prod with large amounts of clear sputum.   eval by Constance Holster  09/24/15 because still tongue was irritated reporting per notes sore throat and hoarseness > erythema of VC's only  And nor recs - after this pt reports no clearing throat or coughing  while on delsym  Then stopped neurontin due to side effects and cough worse since, though convinced the cough flared before he stopped it, this part of hx is unclear but now cc  recurrent cough more upright than lying down  Using nice lozenges/ sporadic daytime cough , min productive mucoid sputum/ no flutter valve in hand   >>delsym /tylenol #3 , zyrtec   11/07/2015 NPFollow up ; Recurrent Cough  Pt returns for 6 week follow up .  We reviewed all his medications organize them into a medication count with patient education. Neck 5 appears he is take his medications correctly Stopped delsym , tylenol #3 , felt it did not help and memory was being affected.  Cough is much better.  Breathing seems to be improved as well.  Patient has been having some difficulty with muscle weakness and involuntary movements  of his legs or arms. He was admitted and seen by neurology. MRI of the brain and spine were unrevealing. TSH was mildly abnormal at 4.71. Patient has ongoing follow-up with neurology and outpatient setting.. rec Follow med calendar closely and bring to each visit.   04/13/2016 acute extended ov/[facility] re: recurrent cough in setting of uri/ no med calendar / no tylenol #3 Chief Complaint  Patient presents with  . Acute Visit    pt c/o prod cough with clear to yellow mucus, wheezing, chest discomfort & postnasal drip X4d. denies fever/sweats/chills.    Thinks he caught a cold from grand daugher/ hacking cough no sob/ some nasal congestion - had been doing relatively  well on no chronic resp rx  - only on gerd rx = omeprazole 20 mg bid (down from 40 from last ov) >>zpk and pred taper    05/10/2016 NP  Acute OV : Cough only transiently improved p prev ov  Pt presents for an acute office visit.  Complains of 1 week of cough flare. Says cough is mainly dry, worse at night . Has coughing fits so bad that he could throw up  Is using delsym and tylenol # 3 . CT chest in 03/2016 w/ no acute process. Unchanged nodule since 2014 in [facility].  Rec Prednisone taper over next week .  Continue on cough regimen .  follow up Dr. Melvyn Novas  In 6 weeks and As needed   Use sips of water to soothe throat tickle     05/26/2016 acute extended ov/Wert re: cough x 40 years, worse x 1st nov  Chief Complaint  Patient presents with  . Acute Visit    Cough not improving much since last visit 05/10/16. Cough is prod with clear,  thick sputum. He is feeling very tired.    no longer coughing to vomiting but coughing day > noct, min mucus, no resp to delsym and tylenol #3 and thinks he needs his immune system checked out thought note most of the episodes x 40 years have been related to viral uri s def pna or sinus infections. Misunderstood instructions and tapered ppi even when still coughing  No longer coughing at hs or in am at  all until stirs around (if lies in bed for an hour in am no cough at all  >>PPI Twice daily  , Gabapentin 100mg  Twice daily    06/14/2016 Follow up : Cough  Pt returns for 3 weeks for chronic cough . Last ov , PPI was increased to Twice daily  . He was rechallenged with gabapentin 100mg  Twice daily  for cyclical cough . He is feeling so much better, cough is 95% better. No fever, chest pain,orthopnea, edema or fever. Still has some throat clearing we discussed sips of water to help with throat clearing  We reviewed his med calendar and he seems to understand it.    Current Medications, Allergies, Complete Past Medical History, Past Surgical History, Family History, and Social History were reviewed in [employer] record.  ROS  The following are not active complaints unless bolded sore throat, dysphagia, dental problems, itching, sneezing,  nasal congestion or excess/ purulent secretions, ear ache,   fever, chills, sweats, unintended wt loss, classically pleuritic or exertional cp,  orthopnea pnd or leg swelling, presyncope, palpitations, abdominal pain, anorexia, nausea, vomiting, diarrhea  or change in bowel or bladder habits, change in stools or urine, dysuria,hematuria,  rash, arthralgias, visual complaints, headache, numbness, weakness or ataxia or problems with walking or coordination,  change in mood/affect or memory.                    Objective:   Physical Exam  Elderly wm thin nad    Vital signs reviewed  - - Note on arrival 02 sats  96%, on RA  Vitals:   06/14/16 0953  BP: 98/60  Pulse: 77  Temp: 98.1 F (36.7 C)  TempSrc: Oral  SpO2: 96%  Weight: 158 lb 9.6 oz (71.9 kg)          HEENT: nl dentition, turbinates, - oropharynx pristine.  Nl external ear canals without cough reflex   NECK :  without JVD/Nodes/TM/ nl carotid upstrokes bilaterally   LUNGS: no acc muscle use,  Nl contour chest clear to A and P   CV:  RRR  no s3 or murmur or  increase in P2, tr  edema   ABD:  soft and nontender with nl inspiratory excursion in the supine position. No bruits or organomegaly, bowel sounds nl  MS:  ext warm without deformities, calf tenderness, cyanosis or clubbing No obvious joint restrictions or tremors   SKIN: warm and dry with no lesions   NEURO:  alert, approp, nl sensorium with  no motor deficits     CXR:   05/10/16 1. No acute cardiopulmonary disease or significant interval change. 2. Emphysema.    Tammy Parrett NP-C  [facility]   06/14/2016

## 2016-06-21 ENCOUNTER — Ambulatory Visit: Payer: Medicare Other | Admitting: Internal Medicine

## 2016-07-16 ENCOUNTER — Ambulatory Visit: Payer: Medicare Other | Admitting: Internal Medicine

## 2016-08-06 DIAGNOSIS — F900 Attention-deficit hyperactivity disorder, predominantly inattentive type: Secondary | ICD-10-CM | POA: Diagnosis not present

## 2016-08-23 ENCOUNTER — Other Ambulatory Visit: Payer: Self-pay | Admitting: Internal Medicine

## 2016-08-23 DIAGNOSIS — R05 Cough: Secondary | ICD-10-CM

## 2016-08-23 DIAGNOSIS — R058 Other specified cough: Secondary | ICD-10-CM

## 2016-09-13 ENCOUNTER — Ambulatory Visit (INDEPENDENT_AMBULATORY_CARE_PROVIDER_SITE_OTHER): Payer: Medicare Other | Admitting: Internal Medicine

## 2016-09-13 ENCOUNTER — Encounter: Payer: Self-pay | Admitting: Internal Medicine

## 2016-09-13 VITALS — BP 90/60 | HR 90 | Temp 98.1°F | Ht 72.0 in | Wt 162.8 lb

## 2016-09-13 DIAGNOSIS — R05 Cough: Secondary | ICD-10-CM

## 2016-09-13 DIAGNOSIS — R058 Other specified cough: Secondary | ICD-10-CM

## 2016-09-13 MED ORDER — PREDNISONE 10 MG PO TABS: ORAL_TABLET | ORAL | 0 refills | Status: DC

## 2016-09-13 MED ORDER — AZITHROMYCIN 250 MG PO TABS: ORAL_TABLET | ORAL | 0 refills | Status: DC

## 2016-09-13 NOTE — Progress Notes (Signed)
Subjective:    Patient ID: [name], male    DOB: [birth year],     MRN: [id number]    Brief patient profile:  16 yowm quit smoking in 1989 with pattern of pattern of bad cough esp with colds no change before quit or after eval by allergist in [name] referred to pulmonary clinic 08/21/2015 by [name] with "the worst ever cough " = lasted longer / more severe.  Prev w/u by [name] in 2009 with nl pfts.   As of 05/26/2016  Amended hx never 100% gone x 1980s    History of Present Illness  08/21/2015 1st [facility] office visit/ [name]   Chief Complaint  Patient presents with  . Pulmonary Consult    Referred by [name]. Pt c/o cough x 1 month- prod with clear, sticky sputum. He has noticed cough is trigerred by talking and exertion. He states he "goes into spasms" 25-30 x per day. He states he has coughed until almost vomits.   usual spells x lifetime  Last  only a few weeks /this did not feel like a typical cold  At onset one month prior to OV   maint on prilosec 20 mg bid pre- onset  Already 4 abx / pred/ no better  Better at hs/ worse with talking  Sometimes ant cp diffuse fleeting p severe cough  rec The key to effective treatment for your cough is eliminating the non-stop cycle of cough   Once you have eliminated the cough for 3 straight days try reducing the Tylenol #3 first,  then the delsym as tolerated.   Prednisone 10 mg take  4 each am x 2 days,   2 each am x 2 days,  1 each am x 2 days and stop (this is to eliminate allergies and inflammation from coughing) Whenever coughing > prilosec 20 x 2 x 30 min x before bfast and supper - once you are better x one week then change back to previous dose    GERD diet  Always cough into the flutter valve to prevent airway trauma    08/26/2015 acute extended ov/[name] re: persistent severe cough/ finishing bactrim rx today  Chief Complaint  Patient presents with  . Acute Visit    Pt states woke up with bruise on  his left side on 08/25/14- does not recall any injury. He feels pain underneath the left arm. He states that he is having longer periods of time without cough, but cough is more severe when he does cough. He also c/o insomnia since last here.   never took more than one tylenol #3 - cough remains non productive/ denies excess/ purulent sputum or mucus plugs   Extensive bruising L chest wall with pain in L axilla with coughing  rec First take delsym two tsp every 12 hours and supplement if needed with  Tylenol #3  up to 1-2 every 4 hours to suppress the urge to cough at all or even clear your throat. Swallowing water or using ice chips/non mint and menthol containing candies (such as lifesavers or sugarless jolly ranchers) are also effective.  You should rest your voice and avoid activities that you know make you cough. Once you have eliminated the cough for 3 straight days try reducing the Tylenol #3 first,  then the delsym as tolerated.   Whenever coughing > prilosec 20 x 2 x 30 min x before bfast and supper - once you are better x one week  then change back to previous dose    GERD  Always cough into the flutter valve to prevent airway trauma  Xanax 0.5 mg at bedtime as needed  Sinus CT  > neg     09/02/2015  f/u ov/[name] re: cough since Jul 06 2015 / not resp to tylenol #3 x 2 q4, not taking ppi as rec  Chief Complaint  Patient presents with  . Follow-up    Pt c/o continued cough with clear mucus that is more present when pt is in any kind of reclining position. Pt denies wheeze/CP/tightness. Pt reports that once he starts coughing he begins to have "spasms" that last several minutes and often require sips of water and hard candy to resolve. Pt is very concerned and per wife is getting depressed. Pt also c/o frequents "sweats" and low grade fevers.   sense of something stuck at suprasternal notch and if just coughs hard enough it will come up / cp resolved  New night sweats since 2/26 when temp of  100 per wife but no fever since. rec Please remember to go to the   x-ray department downstairs for your tests - we will call you with the results when they are available. Avoid speaking/ sleep in recliner to prevent coughing fits and use the flutter on max resistance as much as possible Prilosec 20 mg x 2 Take 30- 60 min before your first and last meals of the day and Pepcid ac 20 mg at bedtime For drainage / throat tickle try take CHLORPHENIRAMINE  4 mg - take one every 4 hours as needed - available over the counter- may cause drowsiness so start with just a bedtime dose or two and see how you tolerate it before trying in daytime   Depomedrol 120 mg IM today  If not better w/in a few days > gabapentin 100 mg three times a day with meals  Add cxr c/w pna > rx levaquin    Admit date: 09/04/2015 Discharge date: 09/09/2015  Principal Problem:  CAP (community acquired pneumonia) Active Problems:  HLD (hyperlipidemia)  Depression  ADD (attention deficit disorder)  G E R D  Hyperparathyroidism, primary, s/p MI parathyroidectomy 7/18, 3.1 gm adenoma  Upper airway cough syndrome  Acute respiratory distress (HCC)  Barrett's esophagus  Dyspnea on exertion  Influenza  Cough    09/12/2015  Post hosp f/u ov/[name] re:  Influenza pna/ cough since Jan 2017 /02 dep since d/c on neurontin 100 tid  Chief Complaint  Patient presents with  . Follow-up    Pt c/o continued SOB and labored breathing, still some cough - which improved while in hospital - but is now coming back some, and some possible thrush mouth/tongue. Pt denies CP/tightness. Pt is on O2 more frequently and would like to discuss getting a POC.   Still feels sensation of too much throat mucus but no excess production/ just dry hacking - all fever/ sweats resolved  Was d/c on pulmicort neb but too expendsive so did not purchase  rec Clotrimazole troche 10 mg four x daily x 3 days and  Stop (should heal your tongue )  Stop  tussionex to see if bladder function better  02 is 2lpm bedtime only  Take delsym two tsp every 12 hours with flutter and supplement if needed with  Tylenol #3  up to 2 every 4 hours to suppress the urge to cough. Swallowing water or using ice chips/non mint and menthol containing candies (such as lifesavers or  sugarless jolly ranchers) are also effective.  You should rest your voice and avoid activities that you know make you cough. Once you have eliminated the cough for 3 straight days try reducing the Tylenol #3 first,  then the delsym as tolerated. See Tammy NP w/in 2 weeks(or first available after that)  with all your medications > did not do as "all better"   10/02/2015  Acute extended  ov/[name] re: recurrent cough  Chief Complaint  Patient presents with  . Acute Visit    Cough had completely resolved, then developed "new cough" 6 days ago. This cough is prod with large amounts of clear sputum.   eval by Constance Holster  09/24/15 because still tongue was irritated reporting per notes sore throat and hoarseness > erythema of VC's only  And nor recs - after this pt reports no clearing throat or coughing  while on delsym  Then stopped neurontin due to side effects and cough worse since, though convinced the cough flared before he stopped it, this part of hx is unclear but now cc  recurrent cough more upright than lying down  Using nice lozenges/ sporadic daytime cough , min productive mucoid sputum/ no flutter valve in hand   >>delsym /tylenol #3 , zyrtec   11/07/2015 NPFollow up ; Recurrent Cough  Pt returns for 6 week follow up .  We reviewed all his medications organize them into a medication count with patient education. Neck 5 appears he is take his medications correctly Stopped delsym , tylenol #3 , felt it did not help and memory was being affected.  Cough is much better.  Breathing seems to be improved as well.  Patient has been having some difficulty with muscle weakness and involuntary movements  of his legs or arms. He was admitted and seen by neurology. MRI of the brain and spine were unrevealing. TSH was mildly abnormal at 4.71. Patient has ongoing follow-up with neurology and outpatient setting.. rec Follow med calendar closely and bring to each visit.   04/13/2016 acute extended ov/[name] re: recurrent cough in setting of uri/ no med calendar / no tylenol #3 Chief Complaint  Patient presents with  . Acute Visit    pt c/o prod cough with clear to yellow mucus, wheezing, chest discomfort & postnasal drip X4d. denies fever/sweats/chills.    Thinks he caught a cold from grand daugher/ hacking cough no sob/ some nasal congestion - had been doing relatively  well on no chronic resp rx  - only on gerd rx = omeprazole 20 mg bid (down from 40 from last ov) >>zpk and pred taper    05/10/2016 NP  Acute OV : Cough only transiently improved p prev ov  Pt presents for an acute office visit.  Complains of 1 week of cough flare. Says cough is mainly dry, worse at night . Has coughing fits so bad that he could throw up  Is using delsym and tylenol # 3 . CT chest in 03/2016 w/ no acute process. Unchanged nodule since 2014 in [facility].  Rec Prednisone taper over next week .  Continue on cough regimen .  follow up Dr. Melvyn Novas  In 6 weeks and As needed   Use sips of water to soothe throat tickle     05/26/2016 acute extended ov/[name] re: cough x 40 years, worse x 1st nov  Chief Complaint  Patient presents with  . Acute Visit    Cough not improving much since last visit 05/10/16. Cough is prod with clear,  thick sputum. He is feeling very tired.    no longer coughing to vomiting but coughing day > noct, min mucus, no resp to delsym and tylenol #3 and thinks he needs his immune system checked out thought note most of the episodes x 40 years have been related to viral uri s def pna or sinus infections. Misunderstood instructions and tapered ppi even when still coughing  No longer coughing at hs or in am at  all until stirs around (if lies in bed for an hour in am no cough at all  rec Change prilosec to 40 mg Take 30- 60 min before your first and last meals of the day automatically and do not taper  Gabapentin 100 mg with bfast and supper only  For cough use mucinex dm up to 1200 mg every 12 hours and as much flutter valve as you can Keep either candy, ice chips or sips of water handy to use when you swallo    06/14/2016 NP Follow up : Cough  Pt returns for 3 weeks for chronic cough . Last ov , PPI was increased to Twice daily  . He was rechallenged with gabapentin 100mg  Twice daily  for cyclical cough . He is feeling so much better, cough is 95% better. No fever, chest pain,orthopnea, edema or fever. Still has some throat clearing we discussed sips of water to help with throat clearing  rec Follow the med calendar   09/13/2016  f/u ov/[name] re:  Chronic cough improved until ? [name] 4 d prior to [facility]  Chief Complaint  Patient presents with  . Follow-up    Cough had been doing great, until developed "sinus problem" 4 days ago and started coughing up yellow sputum "I assume I have a sinus infection".   acute onset 4 d prior to OV= acute cough with sensation of pnd but no ant nasal discharge   Cough 24/7 with hoarseness/ better with mucinex dm and flutter valve   No obvious day to day or daytime variability or assoc  Sob  or mucus plugs or hemoptysis or cp or chest tightness, subjective wheeze or overt sinus pain or hb symptoms. No unusual exp hx or h/o childhood pna/ asthma or knowledge of premature birth.  Sleeping ok without nocturnal  or early am exacerbation  of respiratory  c/o's or need for noct saba. Also denies any obvious fluctuation of symptoms with weather or environmental changes or other aggravating or alleviating factors except as outlined above   Current Medications, Allergies, Complete Past Medical History, Past Surgical History, Family History, and Social History were reviewed in  [employer] record.  ROS  The following are not active complaints unless bolded sore throat, dysphagia, dental problems, itching, sneezing,  nasal congestion or excess/ purulent secretions, ear ache,   fever, chills, sweats, unintended wt loss, classically pleuritic or exertional cp,  orthopnea pnd or leg swelling, presyncope, palpitations, abdominal pain, anorexia, nausea, vomiting, diarrhea  or change in bowel or bladder habits, change in stools or urine, dysuria,hematuria,  rash, arthralgias, visual complaints, headache, numbness, weakness or ataxia or problems with walking or coordination,  change in mood/affect or memory.                      Objective:   Physical Exam    Elderly thin wm  nad mod hoarse, occ throat clearing   Wt Readings from Last 3 Encounters:  09/13/16 162 lb 12.8 oz (73.8 kg)  06/14/16  158 lb 9.6 oz (71.9 kg)  05/26/16 155 lb 9.6 oz (70.6 kg)    Vital signs reviewed  - Note on arrival 02 sats  97% on RA         HEENT: nl dentition, turbinates, - oropharynx pristine.  Nl external ear canals without cough reflex   NECK :  without JVD/Nodes/TM/ nl carotid upstrokes bilaterally   LUNGS: no acc muscle use,  Nl contour chest with minimal insp and exp rhonchi / mosly transmitted from upper airway   CV:  RRR  no s3 or murmur or increase in P2, no significant  edema   ABD:  soft and nontender with nl inspiratory excursion in the supine position. No bruits or organomegaly, bowel sounds nl  MS:  ext warm without deformities, calf tenderness, cyanosis or clubbing No obvious joint restrictions or tremors   SKIN: warm and dry with no lesions   NEURO:  alert, approp, nl sensorium with  no motor deficits

## 2016-09-13 NOTE — Patient Instructions (Addendum)
When improved ok to drop prilosec back to one Take 30- 60 min before your first and last meals of the day   Ok to cut back on Zyrtec and use as needed  Once no longer coughing, reduce you omeprazole to one twice daily   Change flonase to one automatically at bedtime and the other doses as needed per calendar  Zpak/ Prednisone 10 mg take  4 each am x 2 days,   2 each am x 2 days,  1 each am x 2 days and stop   Please schedule a follow up visit in 3 months but call sooner if needed to see [name] and bring all medications for a new med calendar

## 2016-09-13 NOTE — Assessment & Plan Note (Signed)
Cyclical cough rx  8/54/6270 >>>  - CT sinus 08/29/2015 neg  - Seen 09/24/15 by Constance Holster cc sore throat and hoarseness > laryngoscopy c/w erythema VCs only - neuorontin added 09/02/15 > stopped neurontin 09/26/15 > cough worse see ov   10/02/2015  - CT chest 12/29/15 stable 5 mm nodules > no f/u indicated  - FENO 05/26/2016  =   14 - Spirometry 05/26/2016  wnl including f/v curve s rx prior    -  rechallenge with gabapentin 100 mg bid 05/26/2016 > marked improvement until acute uri/ rhinitis but doubt sinusitis so rx acutely with zpak/ Prednisone 10 mg take  4 each am x 2 days,   2 each am x 2 days,  1 each am x 2 days and stop   Of the three most common causes of  Sub-acute or recurrent or chronic cough, only one (GERD)  can actually contribute to/ trigger  the other two (asthma and post nasal drip syndrome)  and perpetuate the cylce of cough.  While not intuitively obvious, many patients with chronic low grade reflux do not cough until there is a primary insult that disturbs the protective epithelial barrier and exposes sensitive nerve endings.   This is typically viral as the likely trigger here but can be direct physical injury such as with an endotracheal tube.   The point is that once this occurs, it is difficult to eliminate the cycle  using anything but a maximally effective acid suppression regimen at least in the short run, accompanied by an appropriate diet to address non acid GERD and control / eliminate the cough itself for at least 3 days.    I had an extended discussion with the patient reviewing all relevant studies completed to date and  lasting 15 to 20 minutes of a 25 minute visit    Each maintenance medication was reviewed in detail including most importantly the difference between maintenance and prns and under what circumstances the prns are to be triggered using an action plan format that is not reflected in the computer generated alphabetically organized AVS but trather by a customized  med calendar that reflects the AVS meds with confirmed 100% correlation.   In addition, Please see AVS for unique instructions that I personally wrote and verbalized to the the pt in detail and then reviewed with pt  by my nurse highlighting any  changes in therapy recommended at today's visit to their plan of care.

## 2016-09-22 DIAGNOSIS — N433 Hydrocele, unspecified: Secondary | ICD-10-CM | POA: Diagnosis not present

## 2016-09-22 DIAGNOSIS — K4020 Bilateral inguinal hernia, without obstruction or gangrene, not specified as recurrent: Secondary | ICD-10-CM | POA: Diagnosis not present

## 2016-09-24 ENCOUNTER — Ambulatory Visit: Payer: Self-pay | Admitting: General Surgery

## 2016-09-24 NOTE — H&P (Signed)
[name] 09/22/2016 10:10 AM Location: [facility] Patient #: [id number] DOB: 1951-05-03 Married / Language: English / Race: White Male  History of Present Illness Richard Hollingshead MD; 09/22/2016 10:39 AM) The patient is a 66 year old male.   Note:He presents today to discuss repair of his bilateral inguinal hernias. I saw him January 08, 2014 with the same. The left side was symptomatic on the right side not so much. He has developed a large right hydrocele consult Dr. Diona Fanti for this. The thought would be that we could do bilateral open repairs with mesh and he could have hydrocele repair on the right side at the same time (he's had a previous laparotomy for a lower midline incision so I do not feel he is a candidate for laparoscopic repair). He's had problems with a chronic cough and has been seeing Dr. Melvyn Novas for this. That is much better. He does have BPH which is improved somewhat with Flomax. No chronic constipation. Intermittently he has some constipation which improves after taking MiraLAX. His wife is here with him.  Past medical history is notable for sinusitis, recurrent pneumonias, multiple lipomas, hyperkinetic disorder, hyperlipidemia, upper airway cough syndrome, anxiety, depression, gastroesophageal reflux disease  Past Surgical History Nance Pear, CMA; 09/22/2016 10:10 AM) Appendectomy Cataract Surgery Bilateral. Gallbladder Surgery - Open Hip Surgery Left. Oral Surgery Tonsillectomy  Diagnostic Studies History Nance Pear, [place]; 09/22/2016 10:10 AM) Colonoscopy 1-5 years ago  Allergies Nance Pear, CMA; 09/22/2016 10:11 AM) Ciprofloxacin *CHEMICALS* Rash Aspirin 81 *ANALGESICS - NonNarcotic* itching Gabapentin *ANTICONVULSANTS* Hives Allergies Reconciled  Medication History Nance Pear, CMA; 09/22/2016 10:12 AM) Methylphenidate HCl (10MG  Tablet, Oral daily) Active. BuPROPion HCl ER (SR) (150MG  Tablet ER 12HR,  Oral daily) Active. FLUoxetine HCl (40MG  Capsule, Oral daily) Active. PredniSONE (10MG  Tablet, Oral) Active. Tamsulosin HCl (0.4MG  Capsule, Oral daily) Active. Simvastatin (20MG  Tablet, Oral daily) Active. Gabapentin (100MG  Capsule, Oral daily) Active. Acetaminophen-Codeine #3 (300-30MG  Tablet, Oral as needed) Active. Medications Reconciled  Social History Nance Pear, [place]; 09/22/2016 10:10 AM) Alcohol use Moderate alcohol use. Caffeine use Carbonated beverages, Coffee. No drug use Tobacco use Former smoker.  Family History Nance Pear, [place]; 09/22/2016 10:10 AM) Alcohol Abuse Father. Arthritis Brother, Father. Heart Disease Father. Heart disease in male family member before age 45 Heart disease in male family member before age 54 Migraine Headache Daughter, Sister. Prostate Cancer Brother. Thyroid problems Mother, Sister.  Other Problems Nance Pear, [place]; 09/22/2016 10:10 AM) Back Pain Depression Gastric Ulcer Gastroesophageal Reflux Disease Hypercholesterolemia Inguinal Hernia Melanoma     Review of Systems Nance Pear CMA; 09/22/2016 10:10 AM) General Not Present- Appetite Loss, Chills, Fatigue, Fever, Night Sweats, Weight Gain and Weight Loss. Skin Not Present- Change in Wart/Mole, Dryness, Hives, Jaundice, New Lesions, Non-Healing Wounds, Rash and Ulcer. Respiratory Not Present- Bloody sputum, Chronic Cough, Difficulty Breathing, Snoring and Wheezing. Breast Not Present- Breast Mass, Breast Pain, Nipple Discharge and Skin Changes. Cardiovascular Not Present- Chest Pain, Difficulty Breathing Lying Down, Leg Cramps, Palpitations, Rapid Heart Rate, Shortness of Breath and Swelling of Extremities. Gastrointestinal Present- Indigestion. Not Present- Abdominal Pain, Bloating, Bloody Stool, Change in Bowel Habits, Chronic diarrhea, Constipation, Difficulty Swallowing, Excessive gas, Gets full quickly at meals, Hemorrhoids, Nausea,  Rectal Pain and Vomiting. Musculoskeletal Present- Joint Stiffness. Not Present- Back Pain, Joint Pain, Muscle Pain, Muscle Weakness and Swelling of Extremities. Neurological Not Present- Decreased Memory, Fainting, Headaches, Numbness, Seizures, Tingling, Tremor, Trouble walking and Weakness. Psychiatric Not Present- Anxiety, Bipolar, Change in Sleep Pattern, Depression, Fearful  and Frequent crying. Endocrine Not Present- Cold Intolerance, Excessive Hunger, Hair Changes, Heat Intolerance, Hot flashes and New Diabetes. Hematology Not Present- Blood Thinners, Easy Bruising, Excessive bleeding, Gland problems, HIV and Persistent Infections.  Vitals Bary Castilla Bradford CMA; 09/22/2016 10:12 AM) 09/22/2016 10:12 AM Weight: 163.4 lb Height: 70in Body Surface Area: 1.92 m Body Mass Index: 23.45 kg/m  Temp.: 98.8F  Pulse: 81 (Regular)  BP: 112/78 (Sitting, Left Arm, Standard)      Physical Exam Richard Hollingshead MD; 09/22/2016 10:41 AM)  The physical exam findings are as follows: Note:GENERAL APPEARANCE: Thin male in NAD. Pleasant and cooperative.  EARS, NOSE, MOUTH THROAT: [place]/AT external ears: no lesions or deformities external nose: no lesions or deformities hearing: Hearing aid in place, does not appear to have any difficulty. lips: moist, no deformities EYES external: conjunctiva, lids, sclerae normal pupils: equal, round  NECK: Supple, no obvious mass or thyroid mass/enlargement, no trachea deviation  CV ascultation: RRR, no murmur extremity edema: no carotid bruit: no  RESP auscultation: breath sounds equal and clear respiratory effort: normal    GASTROINTESTINAL abdomen: Soft, non-tender, non-distended, no masses liver and spleen: not enlarged. hernia: Bilateral inguinal left larger than right scar: Lower midline  GENITOURINARY scrotum: Right-sided swelling penis: no lesions  LYMPHATIC: No palpable cervical, supraclavicular  adenopathy.  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 Right side is occasionally symptomatic. Cough is almost completely resolved.  Plan: Open bilateral inguinal hernia repair with mesh in coordination with Dr. Diona Fanti and right hydrocelectomy. I have explained the procedure, risks, and aftercare of inguinal hernia repair. Risks include but are not limited to bleeding, infection, wound problems, anesthesia, recurrence, bladder or intestine injury, urinary retention, testicular dysfunction, chronic pain, mesh problems. He seems to understand and agrees with the plan. I recommended he rent a lift chair. I feel that he is at increased risk for postoperative urinary retention so feel he needs to stay overnight.  Jackolyn Confer, M.D.    Electronically signed by Jackolyn Confer, MD at 09/22/2016 10:47 AM      Pre-op/Pre-procedure Orders on 09/22/2016        Routing History        Detailed Report

## 2016-10-05 ENCOUNTER — Other Ambulatory Visit: Payer: Self-pay | Admitting: Urology

## 2016-10-07 DIAGNOSIS — L814 Other melanin hyperpigmentation: Secondary | ICD-10-CM | POA: Diagnosis not present

## 2016-10-07 DIAGNOSIS — L570 Actinic keratosis: Secondary | ICD-10-CM | POA: Diagnosis not present

## 2016-10-07 DIAGNOSIS — D1801 Hemangioma of skin and subcutaneous tissue: Secondary | ICD-10-CM | POA: Diagnosis not present

## 2016-10-07 DIAGNOSIS — Z85820 Personal history of malignant melanoma of skin: Secondary | ICD-10-CM | POA: Diagnosis not present

## 2016-10-07 DIAGNOSIS — L821 Other seborrheic keratosis: Secondary | ICD-10-CM | POA: Diagnosis not present

## 2016-10-11 DIAGNOSIS — H26491 Other secondary cataract, right eye: Secondary | ICD-10-CM | POA: Diagnosis not present

## 2016-10-11 DIAGNOSIS — Z961 Presence of intraocular lens: Secondary | ICD-10-CM | POA: Diagnosis not present

## 2016-10-11 DIAGNOSIS — H43813 Vitreous degeneration, bilateral: Secondary | ICD-10-CM | POA: Diagnosis not present

## 2016-10-14 DIAGNOSIS — M542 Cervicalgia: Secondary | ICD-10-CM | POA: Diagnosis not present

## 2016-10-19 DIAGNOSIS — H26491 Other secondary cataract, right eye: Secondary | ICD-10-CM | POA: Diagnosis not present

## 2016-10-29 NOTE — Patient Instructions (Signed)
[name]  10/29/2016   Your procedure is scheduled on: 11-04-16  Report to [facility] Main  Entrance Take [place]  elevators to 3rd floor to  [place] at 530AM.    Call this number if you have problems the morning of surgery [phone]   Remember: ONLY 1 PERSON MAY GO WITH YOU TO SHORT STAY TO GET  READY MORNING OF [place].  Do not eat food or drink liquids :After Midnight.     Take these medicines the morning of surgery with A SIP OF WATER: bupropion(wellbutrin), fluoxetine(prozac), gabapentin(neurontin)                                You may not have any metal on your body including hair pins and              piercings  Do not wear jewelry, make-up, lotions, powders or perfumes, deodorant                          Men may shave face and neck.   Do not bring valuables to the hospital. [place].  Contacts, dentures or bridgework may not be worn into surgery.  Leave suitcase in the car. After surgery it may be brought to your room.               Please read over the following fact sheets you were given: _____________________________________________________________________             [facility] - Preparing for Surgery Before surgery, you can play an important role.  Because skin is not sterile, your skin needs to be as free of germs as possible.  You can reduce the number of germs on your skin by washing with CHG (chlorahexidine gluconate) soap before surgery.  CHG is an antiseptic cleaner which kills germs and bonds with the skin to continue killing germs even after washing. Please DO NOT use if you have an allergy to CHG or antibacterial soaps.  If your skin becomes reddened/irritated stop using the CHG and inform your nurse when you arrive at Short Stay. Do not shave (including legs and underarms) for at least 48 hours prior to the first CHG shower.  You may shave your face/neck. Please  follow these instructions carefully:  1.  Shower with CHG Soap the night before surgery and the  morning of Surgery.  2.  If you choose to wash your hair, wash your hair first as usual with your  normal  shampoo.  3.  After you shampoo, rinse your hair and body thoroughly to remove the  shampoo.                           4.  Use CHG as you would any other liquid soap.  You can apply chg directly  to the skin and wash                       Gently with a scrungie or clean washcloth.  5.  Apply the CHG Soap to your body ONLY FROM THE NECK DOWN.   Do not use on face/  open                           Wound or open sores. Avoid contact with eyes, ears mouth and genitals (private parts).                       Wash face,  Genitals (private parts) with your normal soap.             6.  Wash thoroughly, paying special attention to the area where your surgery  will be performed.  7.  Thoroughly rinse your body with warm water from the neck down.  8.  DO NOT shower/wash with your normal soap after using and rinsing off  the CHG Soap.                9.  Pat yourself dry with a clean towel.            10.  Wear clean pajamas.            11.  Place clean sheets on your bed the night of your first shower and do not  sleep with pets. Day of Surgery : Do not apply any lotions/deodorants the morning of surgery.  Please wear clean clothes to the hospital/surgery center.  FAILURE TO FOLLOW THESE INSTRUCTIONS MAY RESULT IN THE CANCELLATION OF YOUR SURGERY PATIENT SIGNATURE_________________________________  NURSE SIGNATURE__________________________________  ________________________________________________________________________

## 2016-10-29 NOTE — Progress Notes (Signed)
EKG 03-15-16 epic Stress test 05-23-15 epic ECHO 03-17-16 epic CXR 05-10-16 epic

## 2016-11-01 ENCOUNTER — Other Ambulatory Visit: Payer: Self-pay

## 2016-11-01 ENCOUNTER — Encounter (HOSPITAL_COMMUNITY)
Admission: RE | Admit: 2016-11-01 | Discharge: 2016-11-01 | Disposition: A | Discharge status: Home / Self Care | Payer: Medicare Other | Source: Ambulatory Visit | Attending: General Surgery | Admitting: General Surgery

## 2016-11-01 ENCOUNTER — Encounter (HOSPITAL_COMMUNITY): Payer: Self-pay

## 2016-11-01 DIAGNOSIS — Z01818 Encounter for other preprocedural examination: Secondary | ICD-10-CM | POA: Diagnosis not present

## 2016-11-01 DIAGNOSIS — K4020 Bilateral inguinal hernia, without obstruction or gangrene, not specified as recurrent: Secondary | ICD-10-CM | POA: Diagnosis not present

## 2016-11-01 DIAGNOSIS — Z01812 Encounter for preprocedural laboratory examination: Secondary | ICD-10-CM | POA: Diagnosis not present

## 2016-11-01 LAB — CBC
HEMATOCRIT: 40.2 % (ref 39.0–52.0)
Hemoglobin: 13.8 g/dL (ref 13.0–17.0)
MCH: 31.4 pg (ref 26.0–34.0)
MCHC: 34.3 g/dL (ref 30.0–36.0)
MCV: 91.4 fL (ref 78.0–100.0)
Platelets: 168 10*3/uL (ref 150–400)
RBC: 4.4 MIL/uL (ref 4.22–5.81)
RDW: 12.2 % (ref 11.5–15.5)
WBC: 5.9 10*3/uL (ref 4.0–10.5)

## 2016-11-01 LAB — BASIC METABOLIC PANEL
Anion gap: 6 (ref 5–15)
BUN: 20 mg/dL (ref 6–20)
CHLORIDE: 104 mmol/L (ref 101–111)
CO2: 30 mmol/L (ref 22–32)
Calcium: 8.8 mg/dL — ABNORMAL LOW (ref 8.9–10.3)
Creatinine, Ser: 1 mg/dL (ref 0.61–1.24)
GFR calc Af Amer: >60 mL/min (ref >60)
GFR calc non Af Amer: >60 mL/min (ref >60)
GLUCOSE: 105 mg/dL — AB (ref 65–99)
POTASSIUM: 4.4 mmol/L (ref 3.5–5.1)
SODIUM: 140 mmol/L (ref 135–145)

## 2016-11-03 NOTE — H&P (Signed)
H&P  Chief Complaint: right scrotal swelling  History of Present Illness: This 66 year old male has bilateral inguinal hernias and will be having these repaired today by Dr. Zella Richer.  He also has a symptomatic right hydrocele, and has requested repair of this in conjunction with his above surgery.  The patient does have discomfort with the hydrocele, and this gets in the way of activities at times.  He denies any recent trauma to his scrotum, and denies any infections related with the hydrocele, which was confirmed with careful physical examination.  Past Medical History:  Diagnosis Date  . Arthritis   . Bronchitis    history of  . 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 GERD (gastroesophageal reflux disease)   . Hiatal hernia   . 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 Hypercalcemia   . Lipoma    left forearm (3)  . Mitral valve prolapse    does not see cardiologist for. last stress test 2003  . Pneumonia 2009  . Tumor cells, benign    lung, left side    Past Surgical History:  Procedure Laterality Date  . APPENDECTOMY  1984  . CATARACT EXTRACTION     L and R eye  . CHOLECYSTECTOMY  2002  . JOINT REPLACEMENT     Lt hip  . MASTOIDECTOMY  1996  . NASAL SEPTUM SURGERY     sinus surgery  . PARATHYROIDECTOMY    . TOTAL HIP REVISION  06/14/2011   Procedure: TOTAL HIP REVISION;  Surgeon: Kerin Salen;  Location: [place];  Service: Orthopedics;  Laterality: Left;  Left Acetabular  Hip Revision    Home Medications:  Allergies as of 11/03/2016      Reactions   Ciprofloxacin Hcl Other (See Comments)   Burning in ear when drops were used   Asa [aspirin]    Upset stomach    Gabapentin    Weakness and dizziness with higher doses.      Medication List    Notice   Cannot display discharge medications because the patient has not yet been admitted.     Allergies:  Allergies  Allergen Reactions  . Ciprofloxacin Hcl Other (See Comments)    Burning in ear when drops were  used  . Asa [Aspirin]     Upset stomach   . Gabapentin     Weakness and dizziness with higher doses.    Family History  Problem Relation Age of Onset  . Lung cancer Father 30    smoked  . Heart Problems Father   . Cancer Brother     prostate  . Heart Problems Mother     Social History:  reports that he quit smoking about 29 years ago. His smoking use included Cigarettes. He has a 36.00 pack-year smoking history. He has quit using smokeless tobacco. He reports that he drinks about 0.6 oz of alcohol per week . He reports that he does not use drugs.  ROS: A complete review of systems was performed.  All systems are negative except for pertinent findings as noted.  Physical Exam:  Vital signs in last 24 hours:   Constitutional:  Alert and oriented, No acute distress Cardiovascular: Regular rate and rhythm, No JVD Respiratory: Normal respiratory effort, Lungs clear bilaterally GI: Abdomen is soft, nontender, nondistended, no abdominal masses Genitourinary: No CVAT. Normal male phallus, testes are descended bilaterally and non-tender and without masses, scrotum is normal in appearance without lesions or masses,, right hydrocele is  present. Perineum is normal on inspection. Rectal: Normal sphincter tone, no rectal masses, prostate is non tender and without nodularity. Lymphatic: No lymphadenopathy Neurologic: Grossly intact, no focal deficits Psychiatric: Normal mood and affect  Laboratory Data:   Recent Labs  11/01/16 1122  WBC 5.9  HGB 13.8  HCT 40.2  PLT 168     Recent Labs  11/01/16 1122  NA 140  K 4.4  CL 104  GLUCOSE 105*  BUN 20  CALCIUM 8.8*  CREATININE 1.00     No results found for this or any previous visit (from the past 24 hour(s)). No results found for this or any previous visit (from the past 240 hour(s)).  Renal Function:  Recent Labs  11/01/16 1122  CREATININE 1.00   Estimated Creatinine Clearance: 77.2 mL/min (by C-G formula based on SCr  of 1 mg/dL).  Radiologic Imaging: No results found.  Impression/Assessment:  Symptomatic right hydrocele  Plan:  Right hydrocelectomy in conjunction with bilateral inguinal hernia repairs

## 2016-11-03 NOTE — Anesthesia Preprocedure Evaluation (Addendum)
Anesthesia Evaluation  Patient identified by MRN, date of birth, ID band Patient awake    Reviewed: Allergy & Precautions, H&P , NPO status   History of Anesthesia Complications Negative for: history of anesthetic complications  Airway Mallampati: II  TM Distance: <3 FB Neck ROM: Full  Mouth opening: Limited Mouth Opening  Dental  (+) Teeth Intact, Caps, Dental Advisory Given   Pulmonary neg pulmonary ROS, pneumonia, former smoker,    breath sounds clear to auscultation       Cardiovascular + dysrhythmias  Rhythm:Regular Rate:Tachycardia     Neuro/Psych PSYCHIATRIC DISORDERS  Neuromuscular disease    GI/Hepatic Neg liver ROS, hiatal hernia, GERD  ,  Endo/Other    Renal/GU negative Renal ROS     Musculoskeletal   Abdominal (+) + obese,   Peds  Hematology   Anesthesia Other Findings   Reproductive/Obstetrics                             Anesthesia Physical  Anesthesia Plan  ASA: II  Anesthesia Plan: General and Regional   Post-op Pain Management:  Regional for Post-op pain   Induction: Intravenous  Airway Management Planned: Oral ETT  Additional Equipment:   Intra-op Plan:   Post-operative Plan: Extubation in OR  Informed Consent:   Dental advisory given  Plan Discussed with: CRNA  Anesthesia Plan Comments:         Anesthesia Quick Evaluation

## 2016-11-04 ENCOUNTER — Ambulatory Visit (HOSPITAL_COMMUNITY): Payer: Medicare Other | Admitting: Anesthesiology

## 2016-11-04 ENCOUNTER — Observation Stay (HOSPITAL_COMMUNITY)
Admission: RE | Admit: 2016-11-04 | Discharge: 2016-11-05 | Disposition: A | Discharge status: Home / Self Care | Payer: Medicare Other | Source: Ambulatory Visit | Attending: General Surgery | Admitting: General Surgery

## 2016-11-04 ENCOUNTER — Encounter (HOSPITAL_COMMUNITY)
Admission: RE | Disposition: A | Discharge status: Home / Self Care | Payer: Self-pay | Source: Ambulatory Visit | Attending: General Surgery

## 2016-11-04 ENCOUNTER — Encounter (HOSPITAL_COMMUNITY): Payer: Self-pay | Admitting: *Deleted

## 2016-11-04 DIAGNOSIS — F329 Major depressive disorder, single episode, unspecified: Secondary | ICD-10-CM | POA: Diagnosis not present

## 2016-11-04 DIAGNOSIS — E785 Hyperlipidemia, unspecified: Secondary | ICD-10-CM | POA: Insufficient documentation

## 2016-11-04 DIAGNOSIS — Z96642 Presence of left artificial hip joint: Secondary | ICD-10-CM | POA: Diagnosis not present

## 2016-11-04 DIAGNOSIS — Z85820 Personal history of malignant melanoma of skin: Secondary | ICD-10-CM | POA: Diagnosis not present

## 2016-11-04 DIAGNOSIS — K4020 Bilateral inguinal hernia, without obstruction or gangrene, not specified as recurrent: Principal | ICD-10-CM | POA: Diagnosis present

## 2016-11-04 DIAGNOSIS — K219 Gastro-esophageal reflux disease without esophagitis: Secondary | ICD-10-CM | POA: Diagnosis not present

## 2016-11-04 DIAGNOSIS — Z880 Allergy status to penicillin: Secondary | ICD-10-CM | POA: Insufficient documentation

## 2016-11-04 DIAGNOSIS — M1990 Unspecified osteoarthritis, unspecified site: Secondary | ICD-10-CM | POA: Insufficient documentation

## 2016-11-04 DIAGNOSIS — Z7983 Long term (current) use of bisphosphonates: Secondary | ICD-10-CM | POA: Diagnosis not present

## 2016-11-04 DIAGNOSIS — Z886 Allergy status to analgesic agent status: Secondary | ICD-10-CM | POA: Diagnosis not present

## 2016-11-04 DIAGNOSIS — K449 Diaphragmatic hernia without obstruction or gangrene: Secondary | ICD-10-CM | POA: Diagnosis not present

## 2016-11-04 DIAGNOSIS — Z79899 Other long term (current) drug therapy: Secondary | ICD-10-CM | POA: Insufficient documentation

## 2016-11-04 DIAGNOSIS — E7800 Pure hypercholesterolemia, unspecified: Secondary | ICD-10-CM | POA: Insufficient documentation

## 2016-11-04 DIAGNOSIS — G8918 Other acute postprocedural pain: Secondary | ICD-10-CM | POA: Diagnosis not present

## 2016-11-04 DIAGNOSIS — N433 Hydrocele, unspecified: Secondary | ICD-10-CM | POA: Diagnosis not present

## 2016-11-04 HISTORY — PX: INSERTION OF MESH: SHX5868

## 2016-11-04 HISTORY — PX: HYDROCELE EXCISION: SHX482

## 2016-11-04 HISTORY — PX: INGUINAL HERNIA REPAIR: SHX194

## 2016-11-04 SURGERY — REPAIR, HERNIA, INGUINAL, BILATERAL, ADULT
Anesthesia: Regional | Laterality: Right

## 2016-11-04 MED ORDER — SUGAMMADEX SODIUM 200 MG/2ML IV SOLN
INTRAVENOUS | Status: DC | PRN
  Administered 2016-11-04: 200 mg via INTRAVENOUS

## 2016-11-04 MED ORDER — PANTOPRAZOLE SODIUM 40 MG PO TBEC
40.0000 mg | DELAYED_RELEASE_TABLET | Freq: Every day | ORAL | Status: DC
  Administered 2016-11-05: 40 mg via ORAL
  Filled 2016-11-04: qty 1

## 2016-11-04 MED ORDER — MEPERIDINE HCL 50 MG/ML IJ SOLN: 6.2500 mg | INTRAMUSCULAR | Status: DC | PRN

## 2016-11-04 MED ORDER — ENOXAPARIN SODIUM 40 MG/0.4ML ~~LOC~~ SOLN
40.0000 mg | SUBCUTANEOUS | Status: DC
  Administered 2016-11-05: 40 mg via SUBCUTANEOUS
  Filled 2016-11-04: qty 0.4

## 2016-11-04 MED ORDER — DIPHENHYDRAMINE-APAP (SLEEP) 25-500 MG PO TABS: 2.0000 | ORAL_TABLET | Freq: Every day | ORAL | Status: DC

## 2016-11-04 MED ORDER — BUPIVACAINE-EPINEPHRINE (PF) 0.5% -1:200000 IJ SOLN
INTRAMUSCULAR | Status: AC
  Filled 2016-11-04: qty 30

## 2016-11-04 MED ORDER — LIDOCAINE 2% (20 MG/ML) 5 ML SYRINGE
INTRAMUSCULAR | Status: AC
  Filled 2016-11-04: qty 5

## 2016-11-04 MED ORDER — LACTATED RINGERS IV SOLN
INTRAVENOUS | Status: DC | PRN
  Administered 2016-11-04 (×2): via INTRAVENOUS

## 2016-11-04 MED ORDER — PROPOFOL 10 MG/ML IV BOLUS
INTRAVENOUS | Status: AC
  Filled 2016-11-04: qty 20

## 2016-11-04 MED ORDER — CEFAZOLIN SODIUM-DEXTROSE 2-4 GM/100ML-% IV SOLN
2.0000 g | INTRAVENOUS | Status: AC
Start: 2016-11-04 — End: 2016-11-04
  Administered 2016-11-04: 2 g via INTRAVENOUS

## 2016-11-04 MED ORDER — CEFAZOLIN SODIUM-DEXTROSE 2-4 GM/100ML-% IV SOLN
2.0000 g | Freq: Three times a day (TID) | INTRAVENOUS | Status: AC
  Administered 2016-11-04: 16:00:00 2 g via INTRAVENOUS
  Filled 2016-11-04: qty 100

## 2016-11-04 MED ORDER — BUPIVACAINE HCL (PF) 0.25 % IJ SOLN
INTRAMUSCULAR | Status: AC
  Filled 2016-11-04: qty 30

## 2016-11-04 MED ORDER — MIDAZOLAM HCL 5 MG/5ML IJ SOLN
INTRAMUSCULAR | Status: DC | PRN
  Administered 2016-11-04: 2 mg via INTRAVENOUS

## 2016-11-04 MED ORDER — ONDANSETRON HCL 4 MG/2ML IJ SOLN
4.0000 mg | Freq: Four times a day (QID) | INTRAMUSCULAR | Status: DC | PRN
Start: 2016-11-04 — End: 2016-11-05

## 2016-11-04 MED ORDER — OXYCODONE HCL 5 MG PO TABS
5.0000 mg | ORAL_TABLET | ORAL | Status: DC | PRN
  Administered 2016-11-04: 10 mg via ORAL
  Administered 2016-11-04: 5 mg via ORAL
  Administered 2016-11-04 – 2016-11-05 (×5): 10 mg via ORAL
  Filled 2016-11-04 (×6): qty 2

## 2016-11-04 MED ORDER — HYDROMORPHONE HCL 1 MG/ML IJ SOLN
INTRAMUSCULAR | Status: AC
  Administered 2016-11-04: 0.5 mg via INTRAVENOUS
  Filled 2016-11-04: qty 0.5

## 2016-11-04 MED ORDER — HYDROMORPHONE HCL 1 MG/ML IJ SOLN
0.2500 mg | INTRAMUSCULAR | Status: DC | PRN
  Administered 2016-11-04 (×2): 0.5 mg via INTRAVENOUS

## 2016-11-04 MED ORDER — ROPIVACAINE HCL 5 MG/ML IJ SOLN
INTRAMUSCULAR | Status: DC | PRN
  Administered 2016-11-04: 40 mL via EPIDURAL

## 2016-11-04 MED ORDER — BUPROPION HCL ER (SR) 150 MG PO TB12
150.0000 mg | ORAL_TABLET | Freq: Two times a day (BID) | ORAL | Status: DC
  Administered 2016-11-04 – 2016-11-05 (×2): 150 mg via ORAL
  Filled 2016-11-04 (×2): qty 1

## 2016-11-04 MED ORDER — SODIUM CHLORIDE 0.9 % IR SOLN
Status: AC
  Filled 2016-11-04: qty 500000

## 2016-11-04 MED ORDER — BUPIVACAINE HCL (PF) 0.25 % IJ SOLN
INTRAMUSCULAR | Status: DC | PRN
  Administered 2016-11-04: 5 mL

## 2016-11-04 MED ORDER — GABAPENTIN 100 MG PO CAPS
100.0000 mg | ORAL_CAPSULE | Freq: Two times a day (BID) | ORAL | Status: DC
  Administered 2016-11-04 – 2016-11-05 (×2): 100 mg via ORAL
  Filled 2016-11-04 (×2): qty 1

## 2016-11-04 MED ORDER — ROCURONIUM BROMIDE 100 MG/10ML IV SOLN
INTRAVENOUS | Status: DC | PRN
  Administered 2016-11-04: 10 mg via INTRAVENOUS
  Administered 2016-11-04: 50 mg via INTRAVENOUS
  Administered 2016-11-04: 10 mg via INTRAVENOUS

## 2016-11-04 MED ORDER — ONDANSETRON HCL 4 MG/2ML IJ SOLN
INTRAMUSCULAR | Status: AC
  Filled 2016-11-04: qty 2

## 2016-11-04 MED ORDER — TAMSULOSIN HCL 0.4 MG PO CAPS
0.4000 mg | ORAL_CAPSULE | Freq: Every day | ORAL | Status: DC
  Administered 2016-11-04 – 2016-11-05 (×2): 0.4 mg via ORAL
  Filled 2016-11-04 (×2): qty 1

## 2016-11-04 MED ORDER — MIDAZOLAM HCL 2 MG/2ML IJ SOLN
INTRAMUSCULAR | Status: AC
  Filled 2016-11-04: qty 2

## 2016-11-04 MED ORDER — DIPHENHYDRAMINE HCL 25 MG PO CAPS
50.0000 mg | ORAL_CAPSULE | Freq: Every day | ORAL | Status: DC
  Administered 2016-11-04: 23:00:00 50 mg via ORAL
  Filled 2016-11-04: qty 2

## 2016-11-04 MED ORDER — ONDANSETRON HCL 4 MG/2ML IJ SOLN
INTRAMUSCULAR | Status: DC | PRN
  Administered 2016-11-04: 4 mg via INTRAVENOUS

## 2016-11-04 MED ORDER — METHYLPHENIDATE HCL 10 MG PO TABS
10.0000 mg | ORAL_TABLET | Freq: Three times a day (TID) | ORAL | Status: DC
  Filled 2016-11-04: qty 1

## 2016-11-04 MED ORDER — DM-GUAIFENESIN ER 30-600 MG PO TB12
1.0000 | ORAL_TABLET | Freq: Two times a day (BID) | ORAL | Status: DC
  Administered 2016-11-04 – 2016-11-05 (×2): 1 via ORAL
  Filled 2016-11-04 (×2): qty 1

## 2016-11-04 MED ORDER — ROCURONIUM BROMIDE 50 MG/5ML IV SOSY
PREFILLED_SYRINGE | INTRAVENOUS | Status: AC
  Filled 2016-11-04: qty 5

## 2016-11-04 MED ORDER — MORPHINE SULFATE (PF) 4 MG/ML IV SOLN: 2.0000 mg | INTRAVENOUS | Status: DC | PRN

## 2016-11-04 MED ORDER — CHLORHEXIDINE GLUCONATE CLOTH 2 % EX PADS: 6.0000 | MEDICATED_PAD | Freq: Once | CUTANEOUS | Status: DC

## 2016-11-04 MED ORDER — SODIUM CHLORIDE 0.9 % IR SOLN
Status: DC | PRN
  Administered 2016-11-04: 100 mL

## 2016-11-04 MED ORDER — SUGAMMADEX SODIUM 200 MG/2ML IV SOLN
INTRAVENOUS | Status: AC
  Filled 2016-11-04: qty 2

## 2016-11-04 MED ORDER — HYDROMORPHONE HCL 1 MG/ML IJ SOLN
INTRAMUSCULAR | Status: AC
  Filled 2016-11-04: qty 0.5

## 2016-11-04 MED ORDER — TAMSULOSIN HCL 0.4 MG PO CAPS: 0.4000 mg | ORAL_CAPSULE | Freq: Every day | ORAL | Status: DC

## 2016-11-04 MED ORDER — POLYETHYLENE GLYCOL 3350 17 G PO PACK: 17.0000 g | PACK | Freq: Every day | ORAL | Status: DC | PRN

## 2016-11-04 MED ORDER — FENTANYL CITRATE (PF) 250 MCG/5ML IJ SOLN
INTRAMUSCULAR | Status: AC
  Filled 2016-11-04: qty 5

## 2016-11-04 MED ORDER — PROMETHAZINE HCL 25 MG/ML IJ SOLN: 6.2500 mg | INTRAMUSCULAR | Status: DC | PRN

## 2016-11-04 MED ORDER — BUPIVACAINE-EPINEPHRINE 0.5% -1:200000 IJ SOLN
INTRAMUSCULAR | Status: DC | PRN
  Administered 2016-11-04: 18 mL

## 2016-11-04 MED ORDER — FAMOTIDINE 20 MG PO TABS
20.0000 mg | ORAL_TABLET | Freq: Every day | ORAL | Status: DC
  Administered 2016-11-04: 20 mg via ORAL
  Filled 2016-11-04: qty 1

## 2016-11-04 MED ORDER — PROPOFOL 10 MG/ML IV BOLUS
INTRAVENOUS | Status: DC | PRN
  Administered 2016-11-04: 150 mg via INTRAVENOUS

## 2016-11-04 MED ORDER — CEFAZOLIN SODIUM-DEXTROSE 2-4 GM/100ML-% IV SOLN
INTRAVENOUS | Status: AC
  Filled 2016-11-04: qty 100

## 2016-11-04 MED ORDER — FENTANYL CITRATE (PF) 100 MCG/2ML IJ SOLN
INTRAMUSCULAR | Status: DC | PRN
  Administered 2016-11-04 (×3): 50 ug via INTRAVENOUS

## 2016-11-04 MED ORDER — DOCUSATE SODIUM 100 MG PO CAPS
200.0000 mg | ORAL_CAPSULE | Freq: Every day | ORAL | Status: DC
Start: 2016-11-05 — End: 2016-11-05
  Administered 2016-11-05: 200 mg via ORAL
  Filled 2016-11-04: qty 2

## 2016-11-04 MED ORDER — LIDOCAINE HCL (CARDIAC) 20 MG/ML IV SOLN
INTRAVENOUS | Status: DC | PRN
  Administered 2016-11-04: 60 mg via INTRAVENOUS

## 2016-11-04 MED ORDER — OXYCODONE HCL 5 MG PO TABS
ORAL_TABLET | ORAL | Status: AC
  Administered 2016-11-04: 5 mg via ORAL
  Filled 2016-11-04: qty 2

## 2016-11-04 MED ORDER — ACETAMINOPHEN 500 MG PO TABS
1000.0000 mg | ORAL_TABLET | Freq: Every day | ORAL | Status: DC
  Administered 2016-11-04: 1000 mg via ORAL
  Filled 2016-11-04: qty 2

## 2016-11-04 MED ORDER — PHENYLEPHRINE HCL 10 MG/ML IJ SOLN
INTRAMUSCULAR | Status: DC | PRN
  Administered 2016-11-04: 80 ug via INTRAVENOUS
  Administered 2016-11-04 (×2): 40 ug via INTRAVENOUS

## 2016-11-04 MED ORDER — FLUOXETINE HCL 20 MG PO CAPS
40.0000 mg | ORAL_CAPSULE | Freq: Every day | ORAL | Status: DC
  Administered 2016-11-05: 40 mg via ORAL
  Filled 2016-11-04: qty 2

## 2016-11-04 MED ORDER — ONDANSETRON 4 MG PO TBDP: 4.0000 mg | ORAL_TABLET | Freq: Four times a day (QID) | ORAL | Status: DC | PRN

## 2016-11-04 SURGICAL SUPPLY — 61 items
BENZOIN TINCTURE PRP APPL 2/3 (GAUZE/BANDAGES/DRESSINGS) ×8 IMPLANT
BLADE HEX COATED 2.75 (ELECTRODE) ×4 IMPLANT
BLADE SURG 15 STRL LF DISP TIS (BLADE) ×2 IMPLANT
BLADE SURG 15 STRL SS (BLADE) ×2
BNDG GAUZE ELAST 4 BULKY (GAUZE/BANDAGES/DRESSINGS) ×4 IMPLANT
CHLORAPREP W/TINT 26ML (MISCELLANEOUS) IMPLANT
CLOSURE WOUND 1/2 X4 (GAUZE/BANDAGES/DRESSINGS) ×2
COVER SURGICAL LIGHT HANDLE (MISCELLANEOUS) ×4 IMPLANT
DECANTER SPIKE VIAL GLASS SM (MISCELLANEOUS) IMPLANT
DERMABOND ADVANCED (GAUZE/BANDAGES/DRESSINGS)
DERMABOND ADVANCED .7 DNX12 (GAUZE/BANDAGES/DRESSINGS) IMPLANT
DRAIN PENROSE 18X1/2 LTX STRL (DRAIN) IMPLANT
DRAIN PENROSE 18X1/4 LTX STRL (WOUND CARE) IMPLANT
DRAPE INCISE IOBAN 66X45 STRL (DRAPES) ×4 IMPLANT
DRAPE LAPAROTOMY T 98X78 PEDS (DRAPES) ×4 IMPLANT
DRAPE LAPAROTOMY TRNSV 102X78 (DRAPE) IMPLANT
DRAPE UTILITY XL STRL (DRAPES) ×4 IMPLANT
DRSG TEGADERM 2-3/8X2-3/4 SM (GAUZE/BANDAGES/DRESSINGS) IMPLANT
DRSG TEGADERM 4X4.75 (GAUZE/BANDAGES/DRESSINGS) ×8 IMPLANT
DRSG TELFA 3X8 NADH (GAUZE/BANDAGES/DRESSINGS) IMPLANT
ELECT PENCIL ROCKER SW 15FT (MISCELLANEOUS) ×4 IMPLANT
ELECT REM PT RETURN 15FT ADLT (MISCELLANEOUS) ×4 IMPLANT
GAUZE SPONGE 2X2 8PLY STRL LF (GAUZE/BANDAGES/DRESSINGS) ×2 IMPLANT
GAUZE SPONGE 4X4 12PLY STRL (GAUZE/BANDAGES/DRESSINGS) IMPLANT
GLOVE BIOGEL M 8.0 STRL (GLOVE) ×4 IMPLANT
GLOVE ECLIPSE 8.0 STRL XLNG CF (GLOVE) ×4 IMPLANT
GLOVE INDICATOR 8.0 STRL GRN (GLOVE) ×8 IMPLANT
GOWN STRL REUS W/ TWL XL LVL3 (GOWN DISPOSABLE) ×2 IMPLANT
GOWN STRL REUS W/TWL LRG LVL3 (GOWN DISPOSABLE) ×4 IMPLANT
GOWN STRL REUS W/TWL XL LVL3 (GOWN DISPOSABLE) ×14 IMPLANT
KIT BASIN OR (CUSTOM PROCEDURE TRAY) ×4 IMPLANT
MESH HERNIA 3X6 (Mesh General) ×8 IMPLANT
NEEDLE HYPO 22GX1.5 SAFETY (NEEDLE) IMPLANT
NEEDLE HYPO 25X1 1.5 SAFETY (NEEDLE) ×4 IMPLANT
NS IRRIG 1000ML POUR BTL (IV SOLUTION) ×4 IMPLANT
PACK BASIC VI WITH GOWN DISP (CUSTOM PROCEDURE TRAY) IMPLANT
PACK GENERAL/GYN (CUSTOM PROCEDURE TRAY) ×4 IMPLANT
PUMP ON Q 100MLX2ML/HR (PAIN MANAGEMENT) IMPLANT
SPONGE GAUZE 2X2 STER 10/PKG (GAUZE/BANDAGES/DRESSINGS) ×2
SPONGE LAP 4X18 X RAY DECT (DISPOSABLE) IMPLANT
STRIP CLOSURE SKIN 1/2X4 (GAUZE/BANDAGES/DRESSINGS) ×6 IMPLANT
SUCTION YANKAUER HANDLE (MISCELLANEOUS) ×4 IMPLANT
SUPPORT SCROTAL LG STRP (MISCELLANEOUS) IMPLANT
SUPPORT SCROTAL MED ADLT STRP (MISCELLANEOUS) ×3 IMPLANT
SUPPORTER ATHLETIC LG (MISCELLANEOUS)
SUPPORTER ATHLETIC MED (MISCELLANEOUS) ×1
SUT CHROMIC 3 0 SH 27 (SUTURE) ×4 IMPLANT
SUT CHROMIC 4 0 SH 27 (SUTURE) ×4 IMPLANT
SUT MNCRL AB 4-0 PS2 18 (SUTURE) ×8 IMPLANT
SUT PROLENE 2 0 CT2 30 (SUTURE) ×16 IMPLANT
SUT VIC AB 2-0 SH 18 (SUTURE) ×8 IMPLANT
SUT VIC AB 2-0 UR5 27 (SUTURE) IMPLANT
SUT VIC AB 3-0 SH 27 (SUTURE) ×8
SUT VIC AB 3-0 SH 27XBRD (SUTURE) ×8 IMPLANT
SUT VICRYL 0 TIES 12 18 (SUTURE) IMPLANT
SYR BULB IRRIGATION 50ML (SYRINGE) ×4 IMPLANT
SYR CONTROL 10ML LL (SYRINGE) ×4 IMPLANT
TOWEL OR 17X26 10 PK STRL BLUE (TOWEL DISPOSABLE) ×4 IMPLANT
TOWEL OR NON WOVEN STRL DISP B (DISPOSABLE) ×4 IMPLANT
WATER STERILE IRR 1500ML POUR (IV SOLUTION) IMPLANT
YANKAUER SUCT BULB TIP 10FT TU (MISCELLANEOUS) ×4 IMPLANT

## 2016-11-04 NOTE — Interval H&P Note (Signed)
History and Physical Interval Note:  11/04/2016 7:30 AM  [name]  has presented today for surgery, with the diagnosis of bilateral inguinal hernias, RIGHT HYDROCELE  The various methods of treatment have been discussed with the patient and family. After consideration of risks, benefits and other options for treatment, the patient has consented to  Procedure(s): HERNIA REPAIR INGUINAL ADULT BILATERAL (Bilateral) INSERTION OF MESH (Bilateral) HYDROCELECTOMY ADULT (Right) as a surgical intervention .  The patient's history has been reviewed, patient examined, no change in status, stable for surgery.  I have reviewed the patient's chart and labs.  Questions were answered to the patient's satisfaction.     Jorja Loa

## 2016-11-04 NOTE — Progress Notes (Signed)
AssistedDr. Germeroth with right, left, ultrasound guided, transabdominal plane blocks . Side rails up, monitors on throughout procedure. See vital signs in flow sheet. Tolerated Procedure well.  

## 2016-11-04 NOTE — Interval H&P Note (Signed)
History and Physical Interval Note:  11/04/2016 7:42 AM  [name]  has presented today for surgery, with the diagnosis of bilateral inguinal hernias, RIGHT HYDROCELE  The various methods of treatment have been discussed with the patient and family. After consideration of risks, benefits and other options for treatment, the patient has consented to  Procedure(s): HERNIA REPAIR INGUINAL ADULT BILATERAL (Bilateral) INSERTION OF MESH (Bilateral) HYDROCELECTOMY ADULT (Right) as a surgical intervention .  The patient's history has been reviewed, patient examined, no change in status, stable for surgery.  I have reviewed the patient's chart and labs.  Questions were answered to the patient's satisfaction.     [name] [name]

## 2016-11-04 NOTE — Anesthesia Postprocedure Evaluation (Signed)
Anesthesia Post Note  Patient: [name]  Procedure(s) Performed: Procedure(s) (LRB): HERNIA REPAIR INGUINAL ADULT BILATERAL (Bilateral) INSERTION OF MESH (Bilateral) HYDROCELECTOMY ADULT (Right)  Patient location during evaluation: PACU Anesthesia Type: Regional and General Level of consciousness: sedated and patient cooperative Pain management: pain level controlled Vital Signs Assessment: post-procedure vital signs reviewed and stable Respiratory status: spontaneous breathing Cardiovascular status: stable Anesthetic complications: no       Last Vitals:  Vitals:   11/04/16 1427 11/04/16 1440  BP: (!) 101/53 117/66  Pulse: 86 82  Resp: 17 18  Temp:  37 C    Last Pain:  Vitals:   11/04/16 1427  TempSrc:   PainSc: [place]

## 2016-11-04 NOTE — Anesthesia Procedure Notes (Signed)
Procedure Name: Intubation Date/Time: 11/04/2016 7:55 AM Performed by: Glory Buff Pre-anesthesia Checklist: Patient identified, Emergency Drugs available, Suction available and Patient being monitored Patient Re-evaluated:Patient Re-evaluated prior to inductionOxygen Delivery Method: Circle system utilized Preoxygenation: Pre-oxygenation with 100% oxygen Intubation Type: IV induction Ventilation: Mask ventilation without difficulty and Oral airway inserted - appropriate to patient size Laryngoscope Size: Glidescope and 3 Grade View: Grade I Tube type: Oral Tube size: 7.5 mm Number of attempts: 2 (DL x 1 with Miller 3, unable to see VC's,  DL x 1 with glide scope grade 1 view.) Airway Equipment and Method: Stylet and Oral airway Placement Confirmation: ETT inserted through vocal cords under direct vision,  positive ETCO2 and breath sounds checked- equal and bilateral Secured at: 21 cm Tube secured with: Tape Dental Injury: Teeth and Oropharynx as per pre-operative assessment  Difficulty Due To: Difficult Airway- due to reduced neck mobility Future Recommendations: Recommend- induction with short-acting agent, and alternative techniques readily available

## 2016-11-04 NOTE — Discharge Instructions (Signed)
[facility] _______[facility]   INGUINAL HERNIA REPAIR: POST OP INSTRUCTIONS  Always review your discharge instruction sheet given to you by the facility where your surgery was performed. IF YOU HAVE DISABILITY OR FAMILY LEAVE FORMS, YOU MUST BRING THEM TO THE OFFICE FOR PROCESSING.   DO NOT GIVE THEM TO YOUR DOCTOR.  1. A  prescription for pain medication may be given to you upon discharge.  Take your pain medication as prescribed, if needed.  If narcotic pain medicine is not needed, then you may take acetaminophen (Tylenol) or ibuprofen (Advil) as needed. 2. Take your usually prescribed medications unless otherwise directed. 3. If you need a refill on your pain medication, please contact your pharmacy.  They will contact our office to request authorization. Prescriptions will not be filled after 5 pm or on week-ends. 4. You should follow a light diet the first 24 hours after arrival home, such as soup and crackers, etc.  Be sure to include lots of fluids daily.  Resume your normal diet the day after surgery. 5. Most patients will experience some swelling and bruising in the groin area (and scrotum in men).  Ice packs and reclining will help.  Swelling and bruising can take many days to resolve.  6. It is common to experience some constipation if taking pain medication after surgery.  Increasing fluid intake and taking a stool softener (such as Colace) will usually help or prevent this problem from occurring.  A mild laxative (Milk of Magnesia or Miralax) should be taken according to package directions if there are no bowel movements after 48 hours. 7. Unless discharge instructions indicate otherwise, you may remove your bandages 4 days after surgery, and you may shower at that time.  You may have steri-strips (small skin tapes) in place directly over the incision.  These strips should be left on the skin.  If your surgeon used skin glue on the incision, you may shower in 24 hours.  The glue  will flake off over the next 2-3 weeks.  Any sutures or staples will be removed at the office during your follow-up visit. 8. ACTIVITIES:  You may resume regular (light) daily activities beginning the next day--such as daily self-care, walking, climbing stairs--gradually increasing activities as tolerated.  You may have sexual intercourse when it is comfortable.  Refrain from any heavy lifting or straining-nothing over 10 pounds for 6 weeks.  a. You may drive when you are no longer taking prescription pain medication, you can comfortably wear a seatbelt, and you can safely maneuver your car and apply brakes. b. RETURN TO WORK:  Desk work/Light work in 1-2 weeks, full duty in 6 weeks._________________________________________________________ 9. You should see your doctor in the office for a follow-up appointment approximately 2-3 weeks after your surgery.  Make sure that you call for this appointment within a day or two after you arrive home to insure a convenient appointment time. 10. OTHER INSTRUCTIONS:  __________________________________________________________________________________________________________________________________________________________________________________________  WHEN TO CALL YOUR DOCTOR: 1. Fever over 101.0 2. Inability to urinate 3. Nausea and/or vomiting 4. Extreme swelling or bruising 5. Continued bleeding from incision. 6. Increased pain, redness, or drainage from the incision  The clinic staff is available to answer your questions during regular business hours.  Please dont hesitate to call and ask to speak to one of the nurses for clinical concerns.  If you have a medical emergency, go to the nearest emergency room or call 911.  A surgeon from [facility] is always  on call at the hospital   [address], [place], [place], [place]  [address] ?  [address], [place], [place]   [address] [phone] ? [phone] ? FAX [phone] Web  site: [email or web address]        Scrotal surgery postoperative instructions  Wound:  In most cases your incision will have absorbable sutures that will dissolve within the first 2-3 weeks. Some will fall out even earlier. Expect some redness as the sutures dissolve but this should occur only around the sutures. If there is generalized redness, especially with increasing pain or swelling, let us know. The scrotum will very likely get "black and blue" as the blood in the tissues spread. Sometimes the whole scrotum will turn colors. The black and blue is followed by a yellow and brown color. In time, all the discoloration will go away. In some cases some firm swelling in the area of the testicle may persist for up to 4-6 weeks after the surgery and is considered normal in most cases.  Drain:  If the surgeon placed a drain through the bottom part of your scrotum, it is held in with a small stitch. When instructed, cut the small stitch and slide to drain out. (Remove this drain in a week) Once the drain has been removed, a small hole made drain out for another day or 2. If so, keep a clean washcloth underneath your supportive undergarment, or sterile gauze. Until the hole seals up, all bathing should be in the shower, and not in the bathtub.  Diet:  You may return to your normal diet within 24 hours following your surgery. You may note some mild nausea and possibly vomiting the first 6-8 hours following surgery. This is usually due to the side effects of anesthesia, and will disappear quite soon. I would suggest clear liquids and a very light meal the first evening following your surgery.  Activity:  Your physical activity should be restricted the first 48 hours. During that time you should remain relatively inactive, moving about only when necessary. During the first 7-10 days following surgery you should avoid lifting any heavy objects (anything greater than 15 pounds), and avoid  strenuous exercise. If you work, ask [place] specifically about your restrictions, both for work and home. We will write a note to your employer if needed.  You should plan to wear a tight pair of jockey shorts or an athletic supporter for the first 4-5 days, even to sleep. This will keep the scrotum immobilized to some degree and keep the swelling down. You may find it more comfortable to wear a support longer.  Ice packs should be placed on and off over the scrotum for the first 48 hours. Frozen peas or corn in a ZipLock bag can be frozen, used and re-frozen. Fifteen minutes on and 15 minutes off is a reasonable schedule. The ice is a good pain reliever and keeps the swelling down.  Hygiene:  You may shower 48 hours after your surgery. Tub bathing should be restricted until the seventh day.          Medication:  You will be sent home with some type of pain medication. In many cases you will be sent home with a narcotic pain pill (Vicodin or Tylox). If the pain is not too bad, you may take either Tylenol (acetaminophen) or Advil (ibuprofen) which contain no narcotic agents, and might be tolerated a little better, with fewer side effects. If the pain medication you are  sent home with does not control the pain, you will have to let us know. Some narcotic pain medications cannot be given or refilled by a phone call to a pharmacy.  Problems you should report to [place]:   Fever of 101.0 degrees Fahrenheit or greater.  Moderate or severe swelling under the skin incision or involving the scrotum.  Drug reaction such as hives, a rash, nausea or vomiting.

## 2016-11-04 NOTE — H&P (Signed)
[name]  DOB: [birth year] Married / Language: English / Race: White Male  History of Present Illness  The patient is a 66 year old male.   Note:He presents today for repair of his bilateral inguinal hernias. I saw him January 08, 2014 with the same. The left side was symptomatic on the right side not so much. He has developed a large right hydrocele consult Dr. Diona Fanti for this. The thought would be that we could do bilateral open repairs with mesh and he could have hydrocele repair on the right side at the same time (he's had a previous laparotomy for a lower midline incision so I do not feel he is a candidate for laparoscopic repair). He's had problems with a chronic cough and has been seeing Dr. Melvyn Novas for this. That is much better. He does have BPH which is improved somewhat with Flomax. No chronic constipation. Intermittently he has some constipation which improves after taking MiraLAX. His wife is here with him.  Past medical history is notable for sinusitis, recurrent pneumonias, multiple lipomas, hyperkinetic disorder, hyperlipidemia, upper airway cough syndrome, anxiety, depression, gastroesophageal reflux disease  Past Surgical History Appendectomy Cataract Surgery Bilateral. Gallbladder Surgery - Open Hip Surgery Left. Oral Surgery Tonsillectomy   Allergies Bary Castilla [place], [place]; 09/22/2016 10:11 AM) Ciprofloxacin *CHEMICALS* Rash Aspirin 81 *ANALGESICS - NonNarcotic* itching Gabapentin *ANTICONVULSANTS* Hives Allergies Reconciled  Prior to Admission medications   Medication Sig Start Date End Date Taking? Authorizing Provider  buPROPion (WELLBUTRIN SR) 150 MG 12 hr tablet Take 150 mg by mouth 2 (two) times daily.   Yes Historical Provider, MD  cetirizine (ZYRTEC) 10 MG tablet Take 10 mg by mouth at bedtime.   Yes Historical Provider, MD  dextromethorphan-guaiFENesin (MUCINEX DM) 30-600 MG 12hr tablet Take 1 tablet by mouth 2 (two) times daily.    Yes Historical Provider, MD  diphenhydramine-acetaminophen (TYLENOL PM) 25-500 MG TABS tablet Take 2 tablets by mouth at bedtime. For sleep    Yes Historical Provider, MD  docusate sodium (COLACE) 100 MG capsule Take 200 mg by mouth daily with lunch.    Yes Historical Provider, MD  famotidine (PEPCID) 20 MG tablet Take 1 tablet (20 mg total) by mouth at bedtime. 09/09/15  Yes Reyne Dumas, MD  FLUoxetine (PROZAC) 40 MG capsule Take 40 mg by mouth daily.    Yes Historical Provider, MD  fluticasone (FLONASE) 50 MCG/ACT nasal spray Place 2 sprays into both nostrils 2 (two) times daily. Patient taking differently: Place 2 sprays into both nostrils at bedtime.  09/09/15  Yes Reyne Dumas, MD  gabapentin (NEURONTIN) 100 MG capsule TAKE 1 CAPSULE BY MOUTH TWICE DAILY 08/23/16  Yes Tanda Rockers, MD  methylphenidate (RITALIN) 10 MG tablet Take 10 mg by mouth 3 (three) times daily.     Yes Historical Provider, MD  Multiple Vitamin (MULTIVITAMIN WITH MINERALS) TABS tablet Take 1 tablet by mouth daily.   Yes Historical Provider, MD  omeprazole (PRILOSEC) 20 MG capsule Take 40 mg by mouth 2 (two) times daily before a meal.    Yes Historical Provider, MD  polyethylene glycol (MIRALAX / GLYCOLAX) packet Take 17 g by mouth daily as needed (for constipation.).    Yes Historical Provider, MD  Probiotic Product (PROBIOTIC DAILY) CAPS Take 2 capsules by mouth daily. Reported on 08/21/2015   Yes Historical Provider, MD  simvastatin (ZOCOR) 20 MG tablet Take 20 mg by mouth at bedtime.     Yes Historical Provider, MD  tamsulosin (FLOMAX) 0.4 MG CAPS  capsule Take 0.4 mg by mouth daily with lunch.    Yes Historical Provider, MD  azithromycin (ZITHROMAX) 250 MG tablet Take 2 on day one then 1 daily x 4 days Patient not taking: Reported on 10/28/2016 09/13/16   Tanda Rockers, MD  predniSONE (DELTASONE) 10 MG tablet Take  4 each am x 2 days,   2 each am x 2 days,  1 each am x 2 days and stop Patient not taking: Reported on  10/28/2016 09/13/16   Tanda Rockers, MD  Respiratory Therapy Supplies (FLUTTER) DEVI Use as directed Patient not taking: Reported on 10/28/2016 08/21/15   Tanda Rockers, MD    Social History  Alcohol use Moderate alcohol use. Caffeine use Carbonated beverages, Coffee. No drug use Tobacco use Former smoker.  Family History  Alcohol Abuse Father. Arthritis Brother, Father. Heart Disease Father. Heart disease in male family member before age 60 Heart disease in male family member before age 90 Migraine Headache Daughter, Sister. Prostate Cancer Brother. Thyroid problems Mother, Sister.  Other Problems  Back Pain Depression Gastric Ulcer Gastroesophageal Reflux Disease Hypercholesterolemia Inguinal Hernia Melanoma   Physical Exam   The physical exam findings are as follows: Note:GENERAL APPEARANCE: Thin male in NAD. Pleasant and cooperative.  EARS, NOSE, MOUTH THROAT: [place]/AT external ears: no lesions or deformities external nose: no lesions or deformities hearing: Hearing aid in place, does not appear to have any difficulty. lips: moist, no deformities EYES external: conjunctiva, lids, sclerae normal pupils: equal, round  NECK: Supple, no obvious mass or thyroid mass/enlargement, no trachea deviation  CV ascultation: RRR, no murmur extremity edema: no carotid bruit: no  RESP auscultation: breath sounds equal and clear respiratory effort: normal    GASTROINTESTINAL abdomen: Soft, non-tender, non-distended, no masses liver and spleen: not enlarged. hernia: Bilateral inguinal left larger than right scar: Lower midline  GENITOURINARY scrotum: Right-sided swelling penis: no lesions  LYMPHATIC: No palpable cervical, supraclavicular adenopathy.  SKIN rash or lesion: none subcutaneous nodules: Multiple small subcutaneous masses  NEUROLOGIC speech: normal  PSYCHIATRIC alertness and orientation:  normal mood/affect/behavior: normal judgement and insight: normal    Assessment & Plan  BILATERAL INGUINAL HERNIA WITHOUT OBSTRUCTION OR GANGRENE (K40.20) Impression: Left side remains symptomatic. Right side is occasionally symptomatic. Cough is almost completely resolved.  Plan: Open bilateral inguinal hernia repair with mesh in coordination with Dr. Diona Fanti and right hydrocelectomy. I have explained the procedure, risks, and aftercare of inguinal hernia repair. Risks include but are not limited to bleeding, infection, wound problems, anesthesia, recurrence, bladder or intestine injury, urinary retention, testicular dysfunction, chronic pain, mesh problems. He seems to understand and agrees with the plan. I recommended he rent a lift chair. I feel that he is at increased risk for postoperative urinary retention so feel he needs to stay overnight.  Jackolyn Confer, M.D.

## 2016-11-04 NOTE — Transfer of Care (Signed)
Immediate Anesthesia Transfer of Care Note  Patient: [name]  Procedure(s) Performed: Procedure(s): HERNIA REPAIR INGUINAL ADULT BILATERAL (Bilateral) INSERTION OF MESH (Bilateral) HYDROCELECTOMY ADULT (Right)  Patient Location: PACU  Anesthesia Type:General  Level of Consciousness: awake, alert  and oriented  Airway & Oxygen Therapy: Patient Spontanous Breathing and Patient connected to face mask oxygen  Post-op Assessment: Report given to RN and Post -op Vital signs reviewed and stable  Post vital signs: Reviewed and stable  Last Vitals:  Vitals:   11/04/16 0535  BP: 98/64  Pulse: 74  Resp: 16  Temp: 36.4 C    Last Pain:  Vitals:   11/04/16 0535  TempSrc: Oral         Complications: No apparent anesthesia complications

## 2016-11-04 NOTE — Op Note (Signed)
Operative Note  [name] male 66 y.o. 11/04/2016  PREOPERATIVE DX:  Bilateral inguinal hernias  POSTOPERATIVE DX:  Same (direct hernias)  PROCEDURE:   Open repair of bilateral inguinal hernias with mesh (6" x 3" polypropylene)         Surgeon: [name]   Assistants: none  Anesthesia: General endotracheal anesthesia  Indications:   This is a 66 year old male with a symptomatic right inguinal hernia and a moderate sized left inguinal hernia. He also has a right hydrocele. He now presents for elective bilateral inguinal hernia repair. Dr. Diona Fanti will also repair the right hydrocele    Procedure Detail:  He was seen in the holding area. He had a TAP block by the anesthesiologist there. He was brought to the operating room, placed supine on the operating table, and a general anesthetic was given. The hair in the lower abdominal wall, groin, and scrotal areas was clipped. These areas were then sterilely prepped and draped. A timeout was performed.  1/2% Marcaine solution was infiltrated superficially and deep in the right groin area. A right groin incision was made sharply. The subcutaneous tissue was divided with electrocautery down to the external oblique aponeurosis. Local anesthetic was infiltrated deep to the external oblique aponeurosis. I then made an incision in the external oblique aponeurosis through the external ring medially and toward the anterior superior iliac spine laterally. Using blunt dissection I identified the shelving edge of the inguinal ligament inferiorly and the internal oblique muscle and aponeurosis superiorly. The spermatic cord was isolated and a window was created around it. A moderate-sized direct hernia was noted and was separated from the spermatic cord. At this point Dr. Diona Fanti repaired the right hydrocele through the groin incision. He will dictate that note. After his repair, a antibiotic solution soaked sponge was placed in the right groin  wound.  While Dr. Diona Fanti was repairing the right hydrocele, Marcaine was infiltrated superficially and deep in the left groin. A left groin incision was made through the skin and subcutaneous tissue. Local anesthetic was infiltrated deep to the external black aponeurosis. I then divided the external oblique aponeurosis through the external ring medially and toward the anterior superior iliac spine laterally. Using blunt dissection, the shelving edge of the inguinal ligament was exposed inferiorly and the internal oblique muscle and aponeurosis were exposed superiorly. I direct hernia was noted. The spermatic cord was isolated and a window created around it. I then reduced the direct hernia and we approximated the separated transversalis fascia with a 2-0 Vicryl suture to keep the hernia contents reduced. A piece of polypropylene mesh measuring 6" x 3" was brought into the field and anchored 1 cm medial to the pubic tubercle with 2-0 Prolene suture. The inferior aspect of the mesh was then anchored to the shelving edge of the inguinal ligament with a running 2-0 Prolene suture up to a level 1-2 cm lateral to the internal ring. A partial longitudinal slit was cut into the mesh creating two tails which were wrapped around the spermatic cord. The superior aspect of the mesh was anchored to the internal oblique aponeurosis with interrupted 2-0 Vicryl suture. This provided for adequate coverage with good overlap of the hernia defect.  At this point, the wound was inspected and hemostasis was adequate. The external oblique aponeurosis was then closed over the mesh and cord with running 3-0 Vicryl suture. The subcutaneous tissue was approximated with running 3-0 Vicryl suture. The skin was closed with running 4-0 Monocryl subcuticular  stitch.  The right side was then approached and the moistened sponge removed. A piece of polypropylene mesh measuring 6" x 3" was brought into the field and anchored 1 cm medial to the  pubic tubercle with 2-0 Prolene suture. The inferior aspect of the mesh was then anchored to the shelving edge of the inguinal ligament with a running 2-0 Prolene suture up to a level 1-2 cm lateral to the internal ring. A partial longitudinal slit was cut into the mesh creating two tails which were wrapped around the spermatic cord. The superior aspect of the mesh was anchored to the internal oblique aponeurosis with interrupted 2-0 Vicryl suture. This provided for adequate coverage with good overlap of the hernia defect.  At this point, the wound was inspected and hemostasis was adequate. The external oblique aponeurosis was then closed over the mesh and cord with running 3-0 Vicryl suture. The subcutaneous tissue was approximated with running 3-0 Vicryl suture. The skin was closed with running 4-0 Monocryl subcuticular stitch.  Steri-Strips and sterile dressings were applied.   He tolerated both procedures well, without any apparent complications and was taken to the PACU in satisfactory condition.  Findings:  Bilateral direct hernias  Estimated Blood Loss:  less than 100 mL         Drains: PENROSE in scrotum         Specimens: none        Complications:  * No complications entered in OR log *         Disposition: PACU - hemodynamically stable.         Condition: stable

## 2016-11-04 NOTE — Anesthesia Procedure Notes (Signed)
Anesthesia Regional Block: TAP block   Pre-Anesthetic Checklist: ,, timeout performed, Correct Patient, Correct Site, Correct Laterality, Correct Procedure, Correct Position, site marked, Risks and benefits discussed,  Surgical consent,  Pre-op evaluation,  At surgeon's request and post-op pain management  Laterality: Left and Right  Prep: chloraprep       Needles:   Needle Type: Stimiplex     Needle Length: 9cm      Additional Needles:   Procedures: ultrasound guided,,,,,,,,  Narrative:  Start time: 11/04/2016 7:30 AM End time: 11/04/2016 7:37 AM Injection made incrementally with aspirations every 5 mL.  Performed by: Personally  Anesthesiologist: Nolon Nations  Additional Notes: Patient tolerated well. Good fascial spread noted.

## 2016-11-05 ENCOUNTER — Encounter (HOSPITAL_COMMUNITY): Payer: Self-pay | Admitting: General Surgery

## 2016-11-05 DIAGNOSIS — K449 Diaphragmatic hernia without obstruction or gangrene: Secondary | ICD-10-CM | POA: Diagnosis not present

## 2016-11-05 DIAGNOSIS — E785 Hyperlipidemia, unspecified: Secondary | ICD-10-CM | POA: Diagnosis not present

## 2016-11-05 DIAGNOSIS — K219 Gastro-esophageal reflux disease without esophagitis: Secondary | ICD-10-CM | POA: Diagnosis not present

## 2016-11-05 DIAGNOSIS — N433 Hydrocele, unspecified: Secondary | ICD-10-CM | POA: Diagnosis not present

## 2016-11-05 DIAGNOSIS — K4020 Bilateral inguinal hernia, without obstruction or gangrene, not specified as recurrent: Secondary | ICD-10-CM | POA: Diagnosis not present

## 2016-11-05 DIAGNOSIS — M1990 Unspecified osteoarthritis, unspecified site: Secondary | ICD-10-CM | POA: Diagnosis not present

## 2016-11-05 MED ORDER — OXYCODONE HCL 5 MG PO TABS: 5.0000 mg | ORAL_TABLET | ORAL | 0 refills | Status: DC | PRN

## 2016-11-05 NOTE — Progress Notes (Signed)
Pt was tolerating diet, voiding, and ambulating.  There was some confusion about the drain/stent/suture removal and he was instructed to contact his physician.  D/C instructions and prescription was given.

## 2016-11-05 NOTE — Progress Notes (Signed)
Assessment Active Problems:   Bilateral inguinal hernia s/p open repair with mesh   Right hydrocele s/p hydrocelectomy   -I feel he is ready for discharge   Plan:  Discharge to home.  Instructions discussed with him and his wife.   LOS: 0 days     1 Day Post-Op  Chief Complaint/Subjective: Had difficulty voiding well initially but that has improved.  Ice packs helping with the pain.  Objective: Vital signs in last 24 hours: Temp:  [98.3 F (36.8 C)-100 F (37.8 C)] 100 F (37.8 C) (05/04 1351) Pulse Rate:  [75-88] 87 (05/04 1351) Resp:  [15-18] 15 (05/04 1351) BP: (82-114)/(49-66) 107/58 (05/04 1351) SpO2:  [94 %-99 %] 94 % (05/04 1351) Last BM Date: 11/03/16  Intake/Output from previous day: 05/03 0701 - 05/04 0700 In: 2720 [P.O.:720; I.V.:2000] Out: 1120 [Urine:1095; Blood:25] Intake/Output this shift: Total I/O In: 480 [P.O.:480] Out: 175 [Urine:175]  PE: General- In NAD.  Awake and alert. Abdomen-soft, dressings dry with minimal swelling, no surrounding redness GU-penrose drain in place with small amount of thin, dried serosanguinous drainage on dressing. Lab Results:  No results for input(s): WBC, HGB, HCT, PLT in the last 72 hours. BMET No results for input(s): NA, K, CL, CO2, GLUCOSE, BUN, CREATININE, CALCIUM in the last 72 hours. PT/INR No results for input(s): LABPROT, INR in the last 72 hours. Comprehensive Metabolic Panel:    Component Value Date/Time   NA 140 11/01/2016 1122   NA 140 03/17/2016 0445   K 4.4 11/01/2016 1122   K 3.7 03/17/2016 0445   CL 104 11/01/2016 1122   CL 107 03/17/2016 0445   CO2 30 11/01/2016 1122   CO2 27 03/17/2016 0445   BUN 20 11/01/2016 1122   BUN 15 03/17/2016 0445   CREATININE 1.00 11/01/2016 1122   CREATININE 0.98 03/17/2016 0445   GLUCOSE 105 (H) 11/01/2016 1122   GLUCOSE 92 03/17/2016 0445   CALCIUM 8.8 (L) 11/01/2016 1122   CALCIUM 8.4 (L) 03/17/2016 0445   CALCIUM 9.0 02/19/2011 1233   AST 24 03/16/2016  0329   AST 26 03/15/2016 2039   ALT 20 03/16/2016 0329   ALT 21 03/15/2016 2039   ALKPHOS 60 03/16/2016 0329   ALKPHOS 64 03/15/2016 2039   BILITOT 0.6 03/16/2016 0329   BILITOT 0.5 03/15/2016 2039   PROT 5.9 (L) 03/16/2016 0329   PROT 6.7 03/15/2016 2039   PROT 6.4 02/15/2013 1454   ALBUMIN 3.5 03/16/2016 0329   ALBUMIN 4.1 03/15/2016 2039     Studies/Results: No results found.  Anti-infectives: Anti-infectives    Start     Dose/Rate Route Frequency Ordered Stop   11/04/16 1600  ceFAZolin (ANCEF) IVPB 2g/100 mL premix     2 g 200 mL/hr over 30 Minutes Intravenous Every 8 hours 11/04/16 1449 11/04/16 1603   11/04/16 0835  polymyxin B 500,000 Units, bacitracin 50,000 Units in sodium chloride irrigation 0.9 % 500 mL irrigation  Status:  Discontinued       As needed 11/04/16 0846 11/04/16 0956   11/04/16 0651  ceFAZolin (ANCEF) 2-4 GM/100ML-% IVPB    Comments:  Virgia [place]   : cabinet override      11/04/16 0651 11/04/16 0757   11/04/16 0536  ceFAZolin (ANCEF) IVPB 2g/100 mL premix     2 g 200 mL/hr over 30 Minutes Intravenous On call to O.R. 11/04/16 0536 11/04/16 0827       Kyrra Prada J 11/05/2016

## 2016-11-16 NOTE — Op Note (Signed)
Preoperative diagnosis: Right-sided hydrocele  Postoperative diagnosis: Same   Procedure: Right hydrocelectomy, inguinal approach    Surgeon: Lillette Boxer. Nobuko Gsell, M.D.   Anesthesia: Gen.   Complications: None  Specimen(s): None  Drain(s): 1.  Quarter-inch Penrose from the lower portion of right hemiscrotum  Indications: 66 year old male with bilateral symptomatic hernias.  Additionally, he has a bothersome right sided hydrocele.  The patient had been seen previously by me approximately 2-3 years ago.  He is presenting for surgical management of his bilateral inguinal hernias by Dr. Zella Richer.  The patient does want to have his right hydrocele managed at the same time.  I've discussed the procedure of hydrocelectomy, using an inguinal approach in him, as well as the risks and complications.  These include but are not limited to bleeding, infection, wound problems, recurrence of the hydrocele, chronic testicular pain, among others.  He understands these and desires to proceed.    Technique and findings: The patient was identified both by myself and Dr. Zella Richer in the holding area.  The surgical marking for the right hydrocele was performed.  He was then taken to the operating room where general anesthetic was administered.  He was placed in the recumbent position.  Perineum, genitalia and abdomen were prepped and draped.  Proper timeout was performed.  The right inguinal incision was carried out by Dr. Zella Richer, with dissection carried down to the external oblique fascia.  The inguinal ring and the spermatic cord were identified.  At this point, I took over for the procedure, with Dr. Zella Richer starting dissection on the left side.  The ilioinguinal nerve was carefully identified and dissected away from the spermatic cord.  I then performed.  Dissection distally along the spermatic cord, and pulled traction on it.  This delivered the upper part of the hydrocele into the inguinal incision.   The hydrocele sac/tunica vaginalis was then carefully dissected, and the gubernaculum was eventually divided from the base of the scrotum internally.  In this manner, the entire hydrocele sac was delivered from the scrotum and through the inguinal incision.  The sac was then opened anteriorly.  Hydrocele fluid was drained.  The hydrocele sac was not large, and it did not need to be resected.  It was imbricated posteriorly behind the cord and epididymis, with a running 3-0 chromic.  The cord was infiltrated with quarter percent plain Marcaine, 10 mL.  Careful inspection was made of the epididymis and testicle, both of which were normal.  The scrotum was everted through the inguinal wound, and no bleeding was seen.  I then created a small incision in the dependent right hemiscrotum, and brought a quarter-inch Penrose drain through it.  The drain ran through the scrotum and into the upper scrotum on the right side.  It was sutured to the skin using a 3-0 chromic.  The entire testicle and cord structures were then returned to their anatomic position in the scrotum.  At this point, my portion of the procedure was completed.  The wound was left open, for Dr. Zella Richer to perform eventual hernia on the right.  The patient tolerated my part of the procedure well.  Dr. Zella Richer will dictate the remainder of the operative procedure.

## 2016-11-18 DIAGNOSIS — H7293 Unspecified perforation of tympanic membrane, bilateral: Secondary | ICD-10-CM | POA: Diagnosis not present

## 2016-11-18 DIAGNOSIS — H60392 Other infective otitis externa, left ear: Secondary | ICD-10-CM | POA: Diagnosis not present

## 2016-11-23 ENCOUNTER — Other Ambulatory Visit: Payer: Self-pay | Admitting: Internal Medicine

## 2016-11-23 DIAGNOSIS — R05 Cough: Secondary | ICD-10-CM

## 2016-11-23 DIAGNOSIS — R058 Other specified cough: Secondary | ICD-10-CM

## 2016-11-23 NOTE — Discharge Summary (Signed)
Physician Discharge Summary  Patient ID: [name] MRN: [id number] DOB/AGE: 10/06/1950 66 y.o.  Admit date: 11/04/2016 Discharge date: 11/05/2016  Admission Diagnoses:  Bilateral inguinal hernias  Discharge Diagnoses:  Active Problems:   Bilateral direct inguinal hernias   HLD    Discharged Condition: good  Hospital Course: He underwent open bilateral inguinal hernia repair with mesh.  Postop he initially had difficulty voiding but that improved over time.  He was able to be discharged POD #1.  Discharge instructions were given to him.   Discharge Exam: Blood pressure (!) 107/58, pulse 87, temperature 100 F (37.8 C), temperature source Oral, resp. rate 15, height 6\' 1"  (1.854 m), weight 75.1 kg (165 lb 9 oz), SpO2 94 %.  Disposition: 01-Home or Self Care   Allergies as of 11/05/2016      Reactions   Ciprofloxacin Hcl Other (See Comments)   Burning in ear when drops were used   Asa [aspirin]    Upset stomach    Gabapentin    Weakness and dizziness with higher doses.      Medication List    TAKE these medications   azithromycin 250 MG tablet Commonly known as:  ZITHROMAX Take 2 on day one then 1 daily x 4 days   buPROPion 150 MG 12 hr tablet Commonly known as:  WELLBUTRIN SR Take 150 mg by mouth 2 (two) times daily.   cetirizine 10 MG tablet Commonly known as:  ZYRTEC Take 10 mg by mouth at bedtime.   dextromethorphan-guaiFENesin 30-600 MG 12hr tablet Commonly known as:  MUCINEX DM Take 1 tablet by mouth 2 (two) times daily.   diphenhydramine-acetaminophen 25-500 MG Tabs tablet Commonly known as:  TYLENOL PM Take 2 tablets by mouth at bedtime. For sleep   docusate sodium 100 MG capsule Commonly known as:  COLACE Take 200 mg by mouth daily with lunch.   famotidine 20 MG tablet Commonly known as:  PEPCID Take 1 tablet (20 mg total) by mouth at bedtime.   FLUoxetine 40 MG capsule Commonly known as:  PROZAC Take 40 mg by mouth daily.   fluticasone  50 MCG/ACT nasal spray Commonly known as:  FLONASE Place 2 sprays into both nostrils 2 (two) times daily. What changed:  when to take this   FLUTTER Devi Use as directed   gabapentin 100 MG capsule Commonly known as:  NEURONTIN TAKE 1 CAPSULE BY MOUTH TWICE DAILY   methylphenidate 10 MG tablet Commonly known as:  RITALIN Take 10 mg by mouth 3 (three) times daily.   multivitamin with minerals Tabs tablet Take 1 tablet by mouth daily.   omeprazole 20 MG capsule Commonly known as:  PRILOSEC Take 40 mg by mouth 2 (two) times daily before a meal.   oxyCODONE 5 MG immediate release tablet Commonly known as:  Oxy IR/ROXICODONE Take 1-2 tablets (5-10 mg total) by mouth every 4 (four) hours as needed for moderate pain.   polyethylene glycol packet Commonly known as:  MIRALAX / GLYCOLAX Take 17 g by mouth daily as needed (for constipation.).   predniSONE 10 MG tablet Commonly known as:  DELTASONE Take  4 each am x 2 days,   2 each am x 2 days,  1 each am x 2 days and stop   PROBIOTIC DAILY Caps Take 2 capsules by mouth daily. Reported on 08/21/2015   simvastatin 20 MG tablet Commonly known as:  ZOCOR Take 20 mg by mouth at bedtime.   tamsulosin 0.4 MG Caps capsule Commonly  known as:  FLOMAX Take 0.4 mg by mouth daily with lunch.      Follow-up Information    Franchot Gallo, MD.   Specialty:  Urology Why:  5/25 @ 0900 Contact information: [place] [address] [phone]           Signed: [name] 11/23/2016, 9:22 AM

## 2016-11-25 DIAGNOSIS — N430 Encysted hydrocele: Secondary | ICD-10-CM | POA: Diagnosis not present

## 2016-12-17 ENCOUNTER — Encounter: Payer: Medicare Other | Admitting: Adult Health

## 2016-12-22 ENCOUNTER — Other Ambulatory Visit: Payer: Self-pay | Admitting: Internal Medicine

## 2016-12-22 DIAGNOSIS — R058 Other specified cough: Secondary | ICD-10-CM

## 2016-12-22 DIAGNOSIS — R05 Cough: Secondary | ICD-10-CM

## 2017-01-20 ENCOUNTER — Other Ambulatory Visit: Payer: Self-pay | Admitting: Internal Medicine

## 2017-01-20 DIAGNOSIS — R058 Other specified cough: Secondary | ICD-10-CM

## 2017-01-20 DIAGNOSIS — R05 Cough: Secondary | ICD-10-CM

## 2017-01-24 DIAGNOSIS — M4692 Unspecified inflammatory spondylopathy, cervical region: Secondary | ICD-10-CM | POA: Diagnosis not present

## 2017-01-24 DIAGNOSIS — Z961 Presence of intraocular lens: Secondary | ICD-10-CM | POA: Diagnosis not present

## 2017-01-24 DIAGNOSIS — H43813 Vitreous degeneration, bilateral: Secondary | ICD-10-CM | POA: Diagnosis not present

## 2017-01-24 DIAGNOSIS — M542 Cervicalgia: Secondary | ICD-10-CM | POA: Diagnosis not present

## 2017-01-24 DIAGNOSIS — H26491 Other secondary cataract, right eye: Secondary | ICD-10-CM | POA: Diagnosis not present

## 2017-01-26 DIAGNOSIS — M47812 Spondylosis without myelopathy or radiculopathy, cervical region: Secondary | ICD-10-CM | POA: Diagnosis not present

## 2017-02-01 DIAGNOSIS — M542 Cervicalgia: Secondary | ICD-10-CM | POA: Diagnosis not present

## 2017-02-02 DIAGNOSIS — Z125 Encounter for screening for malignant neoplasm of prostate: Secondary | ICD-10-CM | POA: Diagnosis not present

## 2017-02-04 DIAGNOSIS — M542 Cervicalgia: Secondary | ICD-10-CM | POA: Diagnosis not present

## 2017-02-07 DIAGNOSIS — M542 Cervicalgia: Secondary | ICD-10-CM | POA: Diagnosis not present

## 2017-02-09 DIAGNOSIS — M542 Cervicalgia: Secondary | ICD-10-CM | POA: Diagnosis not present

## 2017-02-09 DIAGNOSIS — N5201 Erectile dysfunction due to arterial insufficiency: Secondary | ICD-10-CM | POA: Diagnosis not present

## 2017-02-14 DIAGNOSIS — M542 Cervicalgia: Secondary | ICD-10-CM | POA: Diagnosis not present

## 2017-02-15 DIAGNOSIS — M47812 Spondylosis without myelopathy or radiculopathy, cervical region: Secondary | ICD-10-CM | POA: Diagnosis not present

## 2017-02-21 ENCOUNTER — Other Ambulatory Visit: Payer: Self-pay | Admitting: Internal Medicine

## 2017-02-21 DIAGNOSIS — R058 Other specified cough: Secondary | ICD-10-CM

## 2017-02-21 DIAGNOSIS — R05 Cough: Secondary | ICD-10-CM

## 2017-02-22 DIAGNOSIS — M542 Cervicalgia: Secondary | ICD-10-CM | POA: Diagnosis not present

## 2017-02-24 DIAGNOSIS — M542 Cervicalgia: Secondary | ICD-10-CM | POA: Diagnosis not present

## 2017-03-03 DIAGNOSIS — R7303 Prediabetes: Secondary | ICD-10-CM | POA: Diagnosis not present

## 2017-03-03 DIAGNOSIS — M792 Neuralgia and neuritis, unspecified: Secondary | ICD-10-CM | POA: Diagnosis not present

## 2017-03-03 DIAGNOSIS — F322 Major depressive disorder, single episode, severe without psychotic features: Secondary | ICD-10-CM | POA: Diagnosis not present

## 2017-03-03 DIAGNOSIS — E785 Hyperlipidemia, unspecified: Secondary | ICD-10-CM | POA: Diagnosis not present

## 2017-03-03 DIAGNOSIS — Z0000 Encounter for general adult medical examination without abnormal findings: Secondary | ICD-10-CM | POA: Diagnosis not present

## 2017-03-03 DIAGNOSIS — I341 Nonrheumatic mitral (valve) prolapse: Secondary | ICD-10-CM | POA: Diagnosis not present

## 2017-03-03 DIAGNOSIS — C439 Malignant melanoma of skin, unspecified: Secondary | ICD-10-CM | POA: Diagnosis not present

## 2017-03-03 DIAGNOSIS — C61 Malignant neoplasm of prostate: Secondary | ICD-10-CM | POA: Diagnosis not present

## 2017-03-03 DIAGNOSIS — Z23 Encounter for immunization: Secondary | ICD-10-CM | POA: Diagnosis not present

## 2017-03-03 DIAGNOSIS — E210 Primary hyperparathyroidism: Secondary | ICD-10-CM | POA: Diagnosis not present

## 2017-03-03 DIAGNOSIS — E038 Other specified hypothyroidism: Secondary | ICD-10-CM | POA: Diagnosis not present

## 2017-03-03 DIAGNOSIS — Z79899 Other long term (current) drug therapy: Secondary | ICD-10-CM | POA: Diagnosis not present

## 2017-03-21 DIAGNOSIS — M47812 Spondylosis without myelopathy or radiculopathy, cervical region: Secondary | ICD-10-CM | POA: Diagnosis not present

## 2017-04-06 DIAGNOSIS — F3340 Major depressive disorder, recurrent, in remission, unspecified: Secondary | ICD-10-CM | POA: Diagnosis not present

## 2017-04-29 DIAGNOSIS — R413 Other amnesia: Secondary | ICD-10-CM | POA: Diagnosis not present

## 2017-05-22 IMAGING — CR DG CHEST 2V
2 series · 2 of 2 positions shown · non-contrast
Comparison: 08/17/2012

CLINICAL DATA: Chest pain for 3 weeks and worsening

EXAM:
CHEST - 2 VIEW

[chest pa]
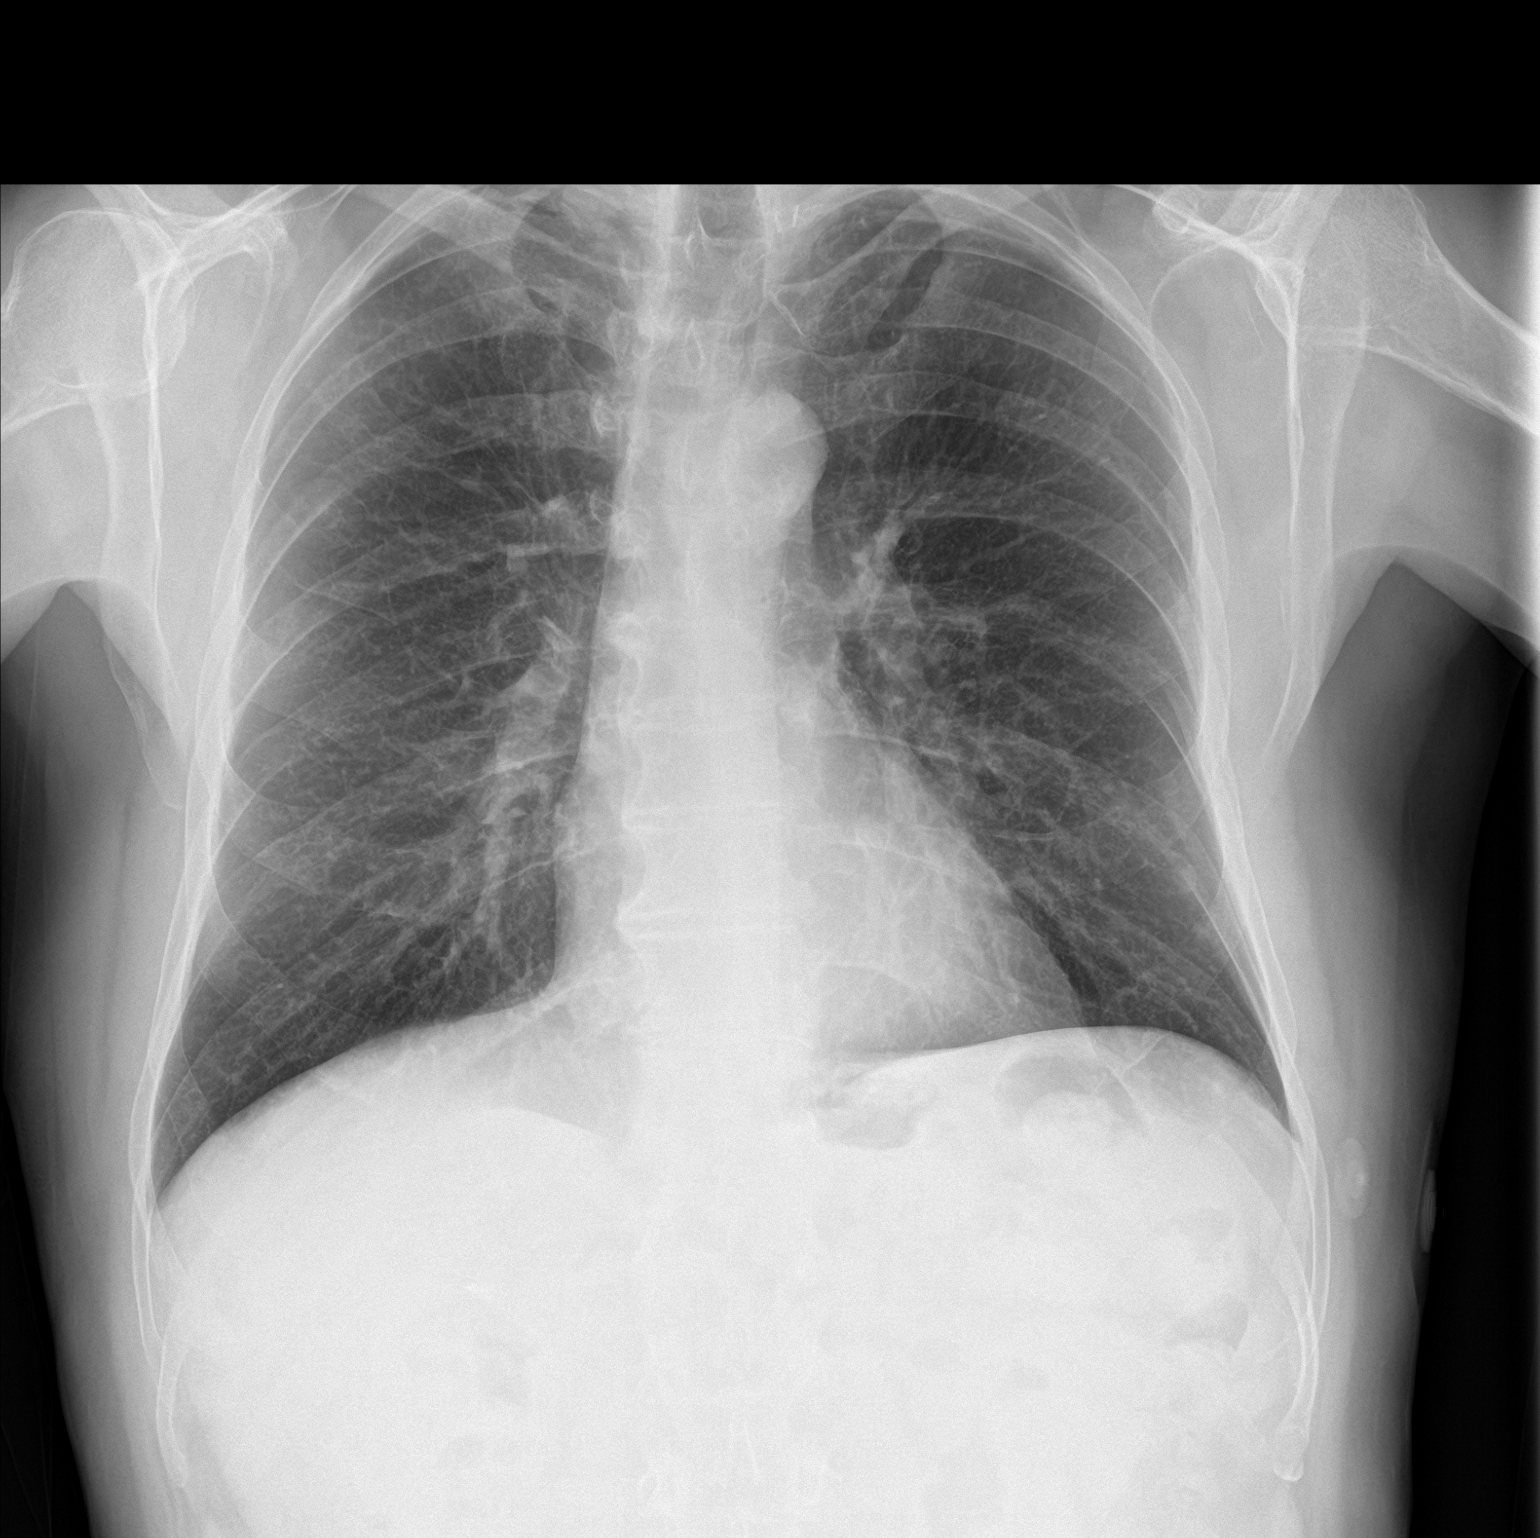

[chest lat]
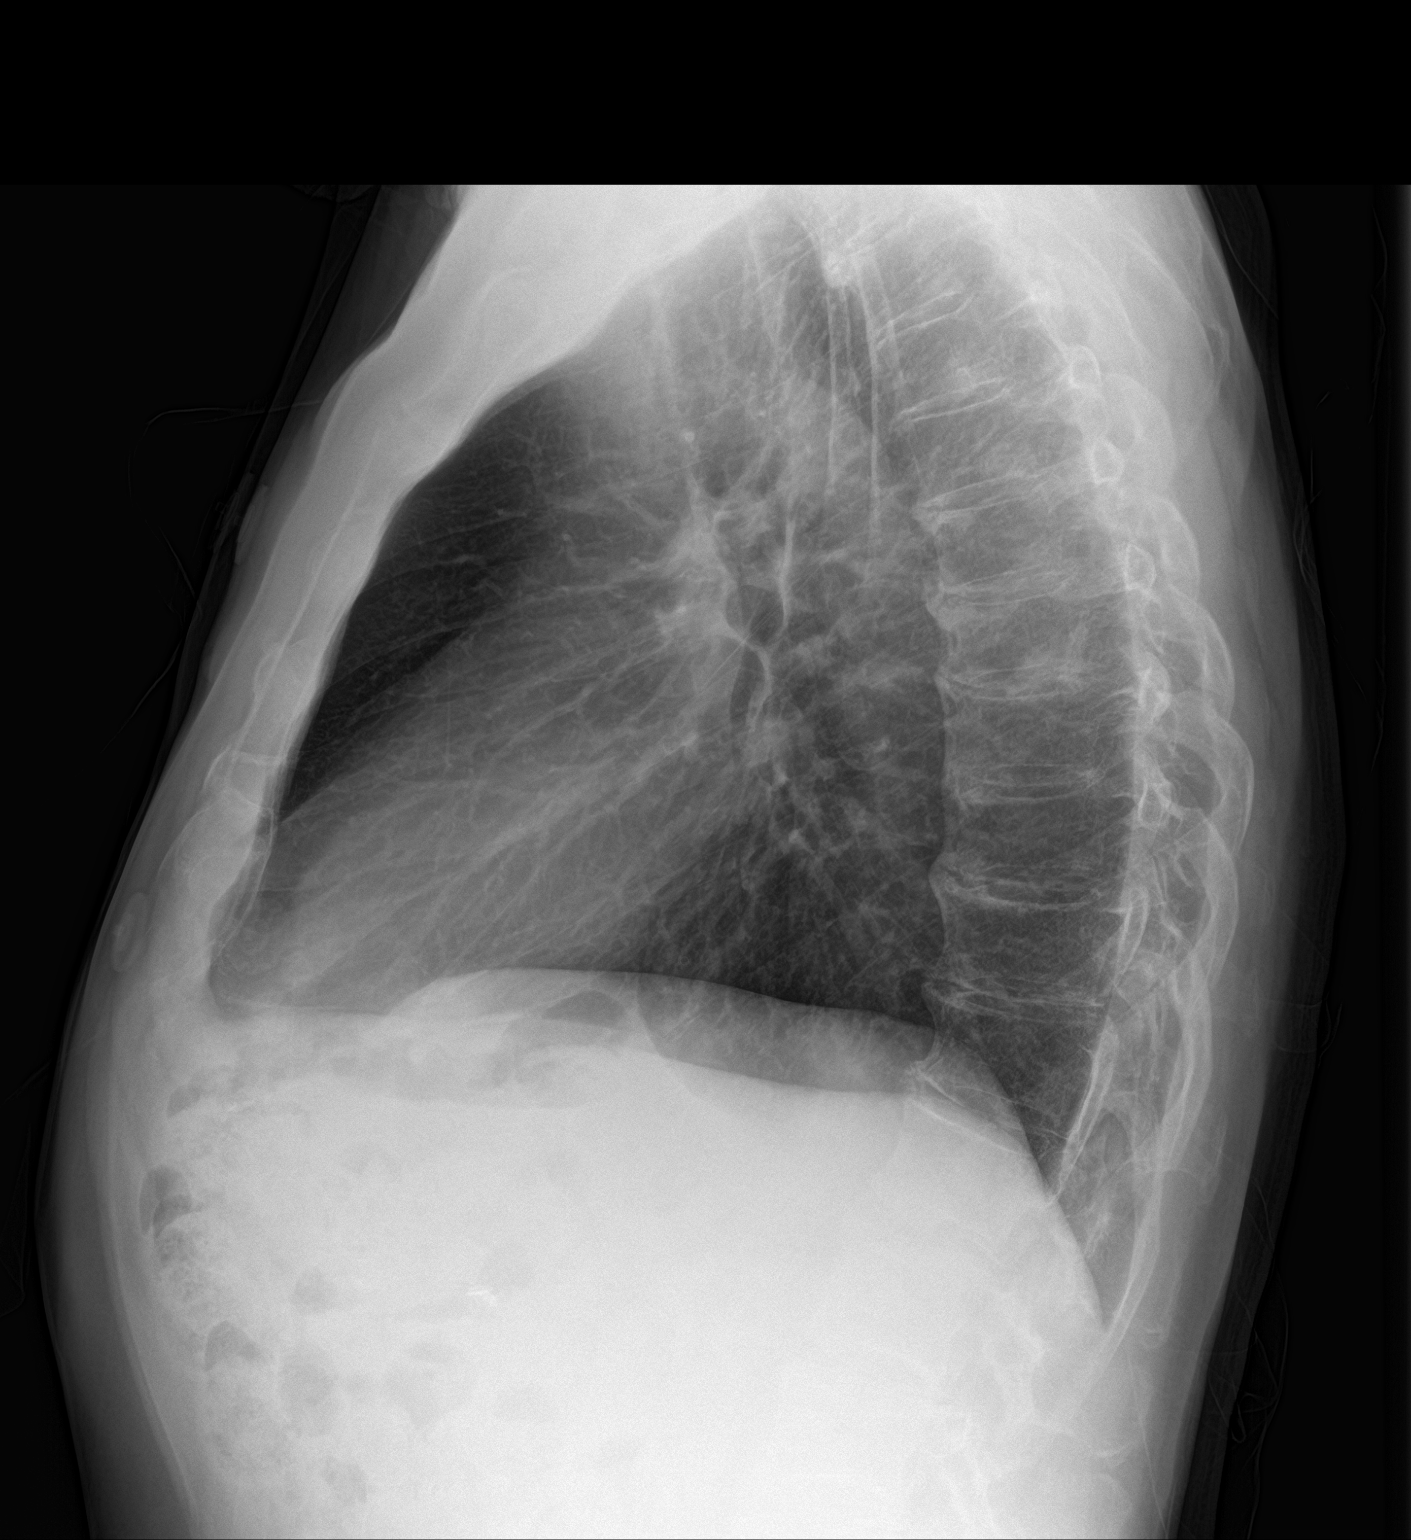

[2 of 2 positions shown; findings below may reference images not displayed]

FINDINGS: The heart size and mediastinal contours are within normal limits.
Both lungs are clear. The visualized skeletal structures are
unremarkable.
IMPRESSION: No active disease.

## 2017-05-25 DIAGNOSIS — R413 Other amnesia: Secondary | ICD-10-CM | POA: Diagnosis not present

## 2017-06-27 DIAGNOSIS — F3340 Major depressive disorder, recurrent, in remission, unspecified: Secondary | ICD-10-CM | POA: Diagnosis not present

## 2017-07-25 DIAGNOSIS — F3340 Major depressive disorder, recurrent, in remission, unspecified: Secondary | ICD-10-CM | POA: Diagnosis not present

## 2017-08-09 DIAGNOSIS — F3340 Major depressive disorder, recurrent, in remission, unspecified: Secondary | ICD-10-CM | POA: Diagnosis not present

## 2017-08-15 DIAGNOSIS — F3340 Major depressive disorder, recurrent, in remission, unspecified: Secondary | ICD-10-CM | POA: Diagnosis not present

## 2017-09-06 DIAGNOSIS — J309 Allergic rhinitis, unspecified: Secondary | ICD-10-CM | POA: Diagnosis not present

## 2017-09-06 DIAGNOSIS — F988 Other specified behavioral and emotional disorders with onset usually occurring in childhood and adolescence: Secondary | ICD-10-CM | POA: Diagnosis not present

## 2017-10-07 DIAGNOSIS — N5201 Erectile dysfunction due to arterial insufficiency: Secondary | ICD-10-CM | POA: Diagnosis not present

## 2017-10-07 DIAGNOSIS — N430 Encysted hydrocele: Secondary | ICD-10-CM | POA: Diagnosis not present

## 2017-10-25 DIAGNOSIS — R079 Chest pain, unspecified: Secondary | ICD-10-CM | POA: Diagnosis not present

## 2017-10-25 DIAGNOSIS — M792 Neuralgia and neuritis, unspecified: Secondary | ICD-10-CM | POA: Diagnosis not present

## 2017-10-25 DIAGNOSIS — K219 Gastro-esophageal reflux disease without esophagitis: Secondary | ICD-10-CM | POA: Diagnosis not present

## 2017-10-25 DIAGNOSIS — F322 Major depressive disorder, single episode, severe without psychotic features: Secondary | ICD-10-CM | POA: Diagnosis not present

## 2017-10-26 DIAGNOSIS — K449 Diaphragmatic hernia without obstruction or gangrene: Secondary | ICD-10-CM | POA: Diagnosis not present

## 2017-10-26 DIAGNOSIS — R079 Chest pain, unspecified: Secondary | ICD-10-CM | POA: Diagnosis not present

## 2017-10-27 ENCOUNTER — Ambulatory Visit (INDEPENDENT_AMBULATORY_CARE_PROVIDER_SITE_OTHER): Payer: Medicare Other | Admitting: Cardiovascular Disease

## 2017-10-27 ENCOUNTER — Other Ambulatory Visit: Payer: Self-pay | Admitting: Cardiovascular Disease

## 2017-10-27 ENCOUNTER — Encounter: Payer: Self-pay | Admitting: Cardiovascular Disease

## 2017-10-27 VITALS — BP 118/74 | HR 90 | Ht 73.0 in | Wt 155.0 lb

## 2017-10-27 DIAGNOSIS — R079 Chest pain, unspecified: Secondary | ICD-10-CM | POA: Diagnosis not present

## 2017-10-27 DIAGNOSIS — I209 Angina pectoris, unspecified: Secondary | ICD-10-CM | POA: Diagnosis not present

## 2017-10-27 DIAGNOSIS — R0789 Other chest pain: Secondary | ICD-10-CM | POA: Diagnosis not present

## 2017-10-27 DIAGNOSIS — E782 Mixed hyperlipidemia: Secondary | ICD-10-CM | POA: Diagnosis not present

## 2017-10-27 MED ORDER — METOPROLOL TARTRATE 50 MG PO TABS: 50.0000 mg | ORAL_TABLET | Freq: Once | ORAL | 0 refills | Status: DC

## 2017-10-27 MED ORDER — NITROGLYCERIN 0.4 MG SL SUBL: 0.4000 mg | SUBLINGUAL_TABLET | SUBLINGUAL | 6 refills | Status: DC | PRN

## 2017-10-27 NOTE — Patient Instructions (Addendum)
Medication Instructions:  Your physician has recommended you make the following change in your medication:   USE Nitroglycerin as needed for chest pain - place 1 pill under your tongue and wait 5 minutes If pain persists, you may repeat x 2 (do not exceed 3 doses) and call 911   Labwork: Your physician recommends that you return for lab work in: BMET about 1 week before your Coronary CT   Testing/Procedures: Cardiac CT scanning, (CAT scanning), is a noninvasive, special x-ray that produces cross-sectional images of the body using x-rays and a computer. CT scans help physicians diagnose and treat medical conditions. For some CT exams, a contrast material is used to enhance visibility in the area of the body being studied. CT scans provide greater clarity and reveal more details than regular x-ray exams.   Follow-Up: Your physician recommends that you schedule a follow-up appointment in: 2-3 months with Dr. Acie Fredrickson  Please arrive at the [facility] main entrance of [facility] at xx:xx AM (30-45 minutes prior to test start time)  [facility] [place], [place] [address] [phone]  Proceed to the [facility] Radiology Department (First Floor).  Please follow these instructions carefully (unless otherwise directed):  Hold all erectile dysfunction medications at least 48 hours prior to test.  On the Night Before the Test: . Drink plenty of water. . Do not consume any caffeinated/decaffeinated beverages or chocolate 12 hours prior to your test. . Do not take any antihistamines 12 hours prior to your test. . If you take Metformin do not take 24 hours prior to test. . If the patient has contrast allergy: ? Patient will need a prescription for Prednisone and very clear instructions (as follows): 1. Prednisone 50 mg - take 13 hours prior to test 2. Take another Prednisone 50 mg 7 hours prior to test 3. Take another Prednisone 50 mg 1 hour prior to  test 4. Take Benadryl 50 mg 1 hour prior to test . Patient must complete all four doses of above prophylactic medications. . Patient will need a ride after test due to Benadryl.  On the Day of the Test: . Drink plenty of water. Do not drink any water within one hour of the test. . Do not eat any food 4 hours prior to the test. . You may take your regular medications prior to the test. . IF NOT ON A BETA BLOCKER - Take 50 mg of lopressor (metoprolol) one hour before the test. . HOLD Furosemide morning of the test.  After the Test: . Drink plenty of water. . After receiving IV contrast, you may experience a mild flushed feeling. This is normal. . On occasion, you may experience a mild rash up to 24 hours after the test. This is not dangerous. If this occurs, you can take Benadryl 25 mg and increase your fluid intake. . If you experience trouble breathing, this can be serious. If it is severe call 911 IMMEDIATELY. If it is mild, please call our office. . If you take any of these medications: Glipizide/Metformin, Avandament, Glucavance, please do not take 48 hours after completing test.    If you need a refill on your cardiac medications before your next appointment, please call your pharmacy.   Thank you for choosing [facility]! Christen Bame, RN [phone]

## 2017-10-27 NOTE — Progress Notes (Signed)
Cardiology Office Note   Date:  10/27/2017   ID:  [name], DOB [birth year], MRN [id number]  PCP:  Jonathon Jordan, MD  Cardiologist:   Mertie Moores, MD   Chief Complaint  Patient presents with  . Chest Pain   Problem List 1. Chest pain 2. Hiatal hernia 3. GERD 4. Barretts esophagitis      [name] is a 67 y.o. male who presents for evaluation of his CP Would wake him up at night.  Not associated with eating or drinking. Various times of the day  Does not exercise so he does not know if it is exacerbated with exertion .  Would become very fatigued with household chores but not cause chest pain  for the past year.  Gets very tired vaccuming 1/2 of the appt.  Walks the dog occasionally . Used to walk 1 1/2 miles a day - stopped walking this in may , 2015  Pains will ast 25-45 minutes.  sulcrafate has helped the pains quite a bit.    October 27, 2017:  [name] is seen today for urgent work in for episodes of chest discomfort.  Is been seeing Dr. Oletta Lamas for a GI evaluation and evaluation is been relatively negative.  Has been having lots of chest pain .   The thought it was GERD For the past week or so , has moderate CP  3-4 / 10  Last 15- 20 minutes Associated with nausea  Does not exercise much but pain does not seem to be exertional . In 2017, he had flu shot and pneumonia shot.  Had a pneumonia like illness folowing that , lost 100 lbs  Was not able to walk  Has recovered well.   Still is slightly weak.   Does not have much stamina   I saw him in 2016,    GXT was normal  Echocardiogram in September, 2017 reveals normal left ventricular systolic function.  Ejection fraction is 50 to 55%.    Past Medical History:  Diagnosis Date  . Arthritis   . Bronchitis    history of  . 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 GERD (gastroesophageal reflux disease)   . Hiatal hernia   . 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 Hypercalcemia   . Lipoma    left  forearm (3)  . Mitral valve prolapse    does not see cardiologist for. last stress test 2003  . Pneumonia 2009  . Tumor cells, benign    lung, left side    Past Surgical History:  Procedure Laterality Date  . APPENDECTOMY  1984  . CATARACT EXTRACTION     L and R eye  . CHOLECYSTECTOMY  2002  . HYDROCELE EXCISION Right 11/04/2016   Procedure: HYDROCELECTOMY ADULT;  Surgeon: Franchot Gallo, MD;  Location: [facility];  Service: Urology;  Laterality: Right;  . INGUINAL HERNIA REPAIR Bilateral 11/04/2016   Procedure: HERNIA REPAIR INGUINAL ADULT BILATERAL;  Surgeon: Jackolyn Confer, MD;  Location: [facility];  Service: General;  Laterality: Bilateral;  . INSERTION OF MESH Bilateral 11/04/2016   Procedure: INSERTION OF MESH;  Surgeon: Jackolyn Confer, MD;  Location: [facility];  Service: General;  Laterality: Bilateral;  . JOINT REPLACEMENT     Lt hip  . MASTOIDECTOMY  1996  . NASAL SEPTUM SURGERY     sinus surgery  . PARATHYROIDECTOMY    . TOTAL HIP REVISION  06/14/2011   Procedure: TOTAL HIP REVISION;  Surgeon:  Kerin Salen;  Location: [facility];  Service: Orthopedics;  Laterality: Left;  Left Acetabular  Hip Revision     Current Outpatient Medications  Medication Sig Dispense Refill  . cetirizine (ZYRTEC) 10 MG tablet Take 10 mg by mouth at bedtime.    . famotidine (PEPCID) 20 MG tablet Take 1 tablet (20 mg total) by mouth at bedtime. 30 tablet 3  . FLUoxetine (PROZAC) 40 MG capsule Take 40 mg by mouth daily.     Marland Kitchen gabapentin (NEURONTIN) 100 MG capsule TAKE 1 CAPSULE BY MOUTH TWICE DAILY 60 capsule 0  . methylphenidate (RITALIN) 10 MG tablet Take 10 mg by mouth 3 (three) times daily.      . Multiple Vitamin (MULTIVITAMIN WITH MINERALS) TABS tablet Take 1 tablet by mouth daily.    Marland Kitchen omeprazole (PRILOSEC) 20 MG capsule Take 40 mg by mouth 2 (two) times daily before a meal.     . polyethylene glycol (MIRALAX / GLYCOLAX) packet Take 17 g by mouth daily as needed (for constipation.).     Marland Kitchen Probiotic  Product (PROBIOTIC DAILY) CAPS Take 2 capsules by mouth daily. Reported on 08/21/2015    . simvastatin (ZOCOR) 20 MG tablet Take 20 mg by mouth at bedtime.      . tamsulosin (FLOMAX) 0.4 MG CAPS capsule Take 0.4 mg by mouth daily with lunch.      No current facility-administered medications for this visit.     Allergies:   Ciprofloxacin hcl; Aspirin; and Gabapentin    Social History:  The patient  reports that he quit smoking about 30 years ago. His smoking use included cigarettes. He has a 36.00 pack-year smoking history. He has quit using smokeless tobacco. He reports that he drinks about 0.6 oz of alcohol per week. He reports that he does not use drugs.   Family History:  The patient's family history includes Cancer in his brother; Heart Problems in his father and mother; Lung cancer (age of onset: 32) in his father.    ROS:    Noted in current history, otherwise review of systems is negative.  Physical Exam: Blood pressure 118/74, pulse 90, height 6\' 1"  (1.854 m), weight 155 lb (70.3 kg), SpO2 98 %.  GEN:  Well nourished, well developed in no acute distress HEENT: Normal NECK: No JVD; No carotid bruits LYMPHATICS: No lymphadenopathy CARDIAC: RRR , no murmurs, rubs, gallops RESPIRATORY:  Clear to auscultation without rales, wheezing or rhonchi  ABDOMEN: Soft, non-tender, non-distended MUSCULOSKELETAL:  No edema; No deformity  SKIN: Warm and dry NEUROLOGIC:  Alert and oriented x 3  EKG: Normal sinus rhythm at 90 beats a minute.  Right axis deviation.  No other abnormalities.    Recent Labs: 11/01/2016: BUN 20; Creatinine, Ser 1.00; Hemoglobin 13.8; Platelets 168; Potassium 4.4; Sodium 140    Lipid Panel    Component Value Date/Time   CHOL 189 10/21/2015 1054   TRIG 110 10/21/2015 1054   HDL 43 10/21/2015 1054   CHOLHDL 4.4 10/21/2015 1054   VLDL 22 10/21/2015 1054   LDLCALC 124 (H) 10/21/2015 1054      Wt Readings from Last 3 Encounters:  10/27/17 155 lb (70.3 kg)   11/04/16 165 lb 9 oz (75.1 kg)  11/01/16 165 lb 9.6 oz (75.1 kg)      Other studies Reviewed: Additional studies/ records that were reviewed today include: . Review of the above records demonstrates:    ASSESSMENT AND PLAN:  1.  Chest pain / Unstable angina :  Sahand presents with episodes of chest discomfort.  These episodes typically occur first thing in the morning.  There are typically not related to exercise.  They are described as a tightness.  They last for about 15 minutes.  There is associated with nausea.  He has had a full GI evaluation and was found not to have any gastritis or esophageal issues. His father had CAD Will give him NTG to take on an as needed basis.   Schedule him for a coronary CT angiogram for further evaluation.  He will follow-up with me in 2 to 3 months for follow-up visit.  2.  Hyperlipidemia: This is been managed by his primary medical doctor.   Current medicines are reviewed at length with the patient today.  The patient does not have concerns regarding medicines.  The following changes have been made:  no change  Labs/ tests ordered today include:  No orders of the defined types were placed in this encounter.    Disposition:         Mertie Moores, MD  10/27/2017 3:47 PM    [facility] [place], [place], [place]  [address] Phone: [phone]; Fax: [phone]   [facility]  [address] [place], [place]  [address] [phone]   Fax [phone]

## 2017-10-28 ENCOUNTER — Inpatient Hospital Stay (HOSPITAL_COMMUNITY)
Admission: EM | Admit: 2017-10-28 | Discharge: 2017-10-31 | DRG: 287 | Disposition: A | Discharge status: Home / Self Care | Payer: Medicare Other | Attending: Cardiovascular Disease | Admitting: Cardiovascular Disease

## 2017-10-28 ENCOUNTER — Emergency Department (HOSPITAL_COMMUNITY): Payer: Medicare Other

## 2017-10-28 ENCOUNTER — Telehealth: Payer: Self-pay | Admitting: Cardiovascular Disease

## 2017-10-28 ENCOUNTER — Encounter (HOSPITAL_COMMUNITY): Payer: Self-pay | Admitting: *Deleted

## 2017-10-28 DIAGNOSIS — D179 Benign lipomatous neoplasm, unspecified: Secondary | ICD-10-CM | POA: Diagnosis present

## 2017-10-28 DIAGNOSIS — Z886 Allergy status to analgesic agent status: Secondary | ICD-10-CM | POA: Diagnosis not present

## 2017-10-28 DIAGNOSIS — I200 Unstable angina: Secondary | ICD-10-CM | POA: Diagnosis not present

## 2017-10-28 DIAGNOSIS — Z79899 Other long term (current) drug therapy: Secondary | ICD-10-CM | POA: Diagnosis not present

## 2017-10-28 DIAGNOSIS — J449 Chronic obstructive pulmonary disease, unspecified: Secondary | ICD-10-CM | POA: Diagnosis not present

## 2017-10-28 DIAGNOSIS — I493 Ventricular premature depolarization: Secondary | ICD-10-CM | POA: Diagnosis present

## 2017-10-28 DIAGNOSIS — K2270 Barrett's esophagus without dysplasia: Secondary | ICD-10-CM | POA: Diagnosis present

## 2017-10-28 DIAGNOSIS — Z8701 Personal history of pneumonia (recurrent): Secondary | ICD-10-CM | POA: Diagnosis not present

## 2017-10-28 DIAGNOSIS — R0602 Shortness of breath: Secondary | ICD-10-CM | POA: Diagnosis not present

## 2017-10-28 DIAGNOSIS — K219 Gastro-esophageal reflux disease without esophagitis: Secondary | ICD-10-CM | POA: Diagnosis present

## 2017-10-28 DIAGNOSIS — I25110 Atherosclerotic heart disease of native coronary artery with unstable angina pectoris: Secondary | ICD-10-CM | POA: Diagnosis not present

## 2017-10-28 DIAGNOSIS — Z87891 Personal history of nicotine dependence: Secondary | ICD-10-CM | POA: Diagnosis not present

## 2017-10-28 DIAGNOSIS — R079 Chest pain, unspecified: Secondary | ICD-10-CM | POA: Diagnosis present

## 2017-10-28 DIAGNOSIS — Z881 Allergy status to other antibiotic agents status: Secondary | ICD-10-CM

## 2017-10-28 DIAGNOSIS — Z9049 Acquired absence of other specified parts of digestive tract: Secondary | ICD-10-CM | POA: Diagnosis not present

## 2017-10-28 DIAGNOSIS — Z96642 Presence of left artificial hip joint: Secondary | ICD-10-CM | POA: Diagnosis present

## 2017-10-28 DIAGNOSIS — J9611 Chronic respiratory failure with hypoxia: Secondary | ICD-10-CM | POA: Diagnosis present

## 2017-10-28 DIAGNOSIS — I341 Nonrheumatic mitral (valve) prolapse: Secondary | ICD-10-CM | POA: Diagnosis present

## 2017-10-28 DIAGNOSIS — Z8249 Family history of ischemic heart disease and other diseases of the circulatory system: Secondary | ICD-10-CM

## 2017-10-28 DIAGNOSIS — Z888 Allergy status to other drugs, medicaments and biological substances status: Secondary | ICD-10-CM

## 2017-10-28 DIAGNOSIS — M1990 Unspecified osteoarthritis, unspecified site: Secondary | ICD-10-CM | POA: Diagnosis present

## 2017-10-28 DIAGNOSIS — E785 Hyperlipidemia, unspecified: Secondary | ICD-10-CM | POA: Diagnosis present

## 2017-10-28 DIAGNOSIS — R9431 Abnormal electrocardiogram [ECG] [EKG]: Secondary | ICD-10-CM | POA: Diagnosis present

## 2017-10-28 HISTORY — DX: Unstable angina: I20.0

## 2017-10-28 LAB — BASIC METABOLIC PANEL
Anion gap: 8 (ref 5–15)
BUN: 16 mg/dL (ref 6–20)
CALCIUM: 8.8 mg/dL — AB (ref 8.9–10.3)
CO2: 27 mmol/L (ref 22–32)
CREATININE: 0.89 mg/dL (ref 0.61–1.24)
Chloride: 102 mmol/L (ref 101–111)
GFR calc Af Amer: >60 mL/min (ref >60)
GFR calc non Af Amer: >60 mL/min (ref >60)
Glucose, Bld: 107 mg/dL — ABNORMAL HIGH (ref 65–99)
Potassium: 4 mmol/L (ref 3.5–5.1)
SODIUM: 137 mmol/L (ref 135–145)

## 2017-10-28 LAB — TSH: TSH: 4.474 u[IU]/mL (ref 0.350–4.500)

## 2017-10-28 LAB — CBC
HCT: 40.8 % (ref 39.0–52.0)
Hemoglobin: 13.9 g/dL (ref 13.0–17.0)
MCH: 31.2 pg (ref 26.0–34.0)
MCHC: 34.1 g/dL (ref 30.0–36.0)
MCV: 91.7 fL (ref 78.0–100.0)
PLATELETS: 190 10*3/uL (ref 150–400)
RBC: 4.45 MIL/uL (ref 4.22–5.81)
RDW: 12.5 % (ref 11.5–15.5)
WBC: 5.8 10*3/uL (ref 4.0–10.5)

## 2017-10-28 LAB — I-STAT TROPONIN, ED
TROPONIN I, POC: 0 ng/mL (ref 0.00–0.08)
TROPONIN I, POC: 0 ng/mL (ref 0.00–0.08)

## 2017-10-28 LAB — TROPONIN I: Troponin I: <0.03 ng/mL (ref <0.03)

## 2017-10-28 LAB — HEMOGLOBIN A1C
Hgb A1c MFr Bld: 5.4 % (ref 4.8–5.6)
Mean Plasma Glucose: 108.28 mg/dL

## 2017-10-28 MED ORDER — ZOLPIDEM TARTRATE 5 MG PO TABS
5.0000 mg | ORAL_TABLET | Freq: Every evening | ORAL | Status: DC | PRN
  Administered 2017-10-30: 5 mg via ORAL
  Filled 2017-10-28: qty 1

## 2017-10-28 MED ORDER — SODIUM CHLORIDE 0.9 % WEIGHT BASED INFUSION: 1.0000 mL/kg/h | INTRAVENOUS | Status: DC

## 2017-10-28 MED ORDER — NITROGLYCERIN 2 % TD OINT
0.5000 [in_us] | TOPICAL_OINTMENT | Freq: Four times a day (QID) | TRANSDERMAL | Status: DC
  Administered 2017-10-28 – 2017-10-31 (×8): 0.5 [in_us] via TOPICAL
  Filled 2017-10-28: qty 30
  Filled 2017-10-28: qty 1

## 2017-10-28 MED ORDER — HEPARIN BOLUS VIA INFUSION
4000.0000 [IU] | Freq: Once | INTRAVENOUS | Status: AC
  Administered 2017-10-28: 4000 [IU] via INTRAVENOUS
  Filled 2017-10-28: qty 4000

## 2017-10-28 MED ORDER — RISAQUAD PO CAPS
2.0000 | ORAL_CAPSULE | Freq: Every day | ORAL | Status: DC
  Administered 2017-10-29 – 2017-10-31 (×3): 2 via ORAL
  Filled 2017-10-28 (×3): qty 2

## 2017-10-28 MED ORDER — ADULT MULTIVITAMIN W/MINERALS CH
1.0000 | ORAL_TABLET | Freq: Every day | ORAL | Status: DC
  Administered 2017-10-29 – 2017-10-30 (×2): 1 via ORAL
  Filled 2017-10-28 (×3): qty 1

## 2017-10-28 MED ORDER — PANTOPRAZOLE SODIUM 40 MG PO TBEC
40.0000 mg | DELAYED_RELEASE_TABLET | Freq: Two times a day (BID) | ORAL | Status: DC
  Administered 2017-10-29 – 2017-10-31 (×5): 40 mg via ORAL
  Filled 2017-10-28 (×5): qty 1

## 2017-10-28 MED ORDER — ASPIRIN 81 MG PO CHEW
81.0000 mg | CHEWABLE_TABLET | ORAL | Status: AC
  Administered 2017-10-31: 81 mg via ORAL
  Filled 2017-10-28: qty 1

## 2017-10-28 MED ORDER — HEPARIN (PORCINE) IN NACL 100-0.45 UNIT/ML-% IJ SOLN
950.0000 [IU]/h | INTRAMUSCULAR | Status: DC
  Administered 2017-10-28: 800 [IU]/h via INTRAVENOUS
  Administered 2017-10-29 – 2017-10-30 (×2): 950 [IU]/h via INTRAVENOUS
  Filled 2017-10-28 (×3): qty 250

## 2017-10-28 MED ORDER — ONDANSETRON HCL 4 MG/2ML IJ SOLN: 4.0000 mg | Freq: Four times a day (QID) | INTRAMUSCULAR | Status: DC | PRN

## 2017-10-28 MED ORDER — SODIUM CHLORIDE 0.9% FLUSH
3.0000 mL | Freq: Two times a day (BID) | INTRAVENOUS | Status: DC
  Administered 2017-10-28 – 2017-10-29 (×2): 3 mL via INTRAVENOUS

## 2017-10-28 MED ORDER — NITROGLYCERIN 0.4 MG SL SUBL
0.4000 mg | SUBLINGUAL_TABLET | SUBLINGUAL | Status: DC | PRN
Start: 2017-10-28 — End: 2017-10-31
  Administered 2017-10-31: 0.4 mg via SUBLINGUAL
  Filled 2017-10-28: qty 1

## 2017-10-28 MED ORDER — ASPIRIN 81 MG PO CHEW: 324.0000 mg | CHEWABLE_TABLET | Freq: Once | ORAL | Status: DC

## 2017-10-28 MED ORDER — SODIUM CHLORIDE 0.9 % IV SOLN: 250.0000 mL | INTRAVENOUS | Status: DC | PRN

## 2017-10-28 MED ORDER — FLUOXETINE HCL 20 MG PO CAPS
40.0000 mg | ORAL_CAPSULE | Freq: Every day | ORAL | Status: DC
  Administered 2017-10-29 – 2017-10-31 (×3): 40 mg via ORAL
  Filled 2017-10-28 (×3): qty 2

## 2017-10-28 MED ORDER — TAMSULOSIN HCL 0.4 MG PO CAPS
0.4000 mg | ORAL_CAPSULE | Freq: Every day | ORAL | Status: DC
  Administered 2017-10-29 – 2017-10-30 (×2): 0.4 mg via ORAL
  Filled 2017-10-28 (×2): qty 1

## 2017-10-28 MED ORDER — SODIUM CHLORIDE 0.9% FLUSH: 3.0000 mL | INTRAVENOUS | Status: DC | PRN

## 2017-10-28 MED ORDER — FAMOTIDINE 20 MG PO TABS
20.0000 mg | ORAL_TABLET | Freq: Every day | ORAL | Status: DC
  Administered 2017-10-28 – 2017-10-30 (×3): 20 mg via ORAL
  Filled 2017-10-28 (×3): qty 1

## 2017-10-28 MED ORDER — SODIUM CHLORIDE 0.9 % WEIGHT BASED INFUSION: 3.0000 mL/kg/h | INTRAVENOUS | Status: DC

## 2017-10-28 MED ORDER — POLYETHYLENE GLYCOL 3350 17 G PO PACK: 17.0000 g | PACK | Freq: Every day | ORAL | Status: DC | PRN

## 2017-10-28 MED ORDER — SIMVASTATIN 20 MG PO TABS
20.0000 mg | ORAL_TABLET | Freq: Every day | ORAL | Status: DC
  Administered 2017-10-28 – 2017-10-30 (×3): 20 mg via ORAL
  Filled 2017-10-28 (×3): qty 1

## 2017-10-28 MED ORDER — LORATADINE 10 MG PO TABS
10.0000 mg | ORAL_TABLET | Freq: Every day | ORAL | Status: DC
  Administered 2017-10-28 – 2017-10-30 (×3): 10 mg via ORAL
  Filled 2017-10-28 (×4): qty 1

## 2017-10-28 MED ORDER — ALPRAZOLAM 0.25 MG PO TABS: 0.2500 mg | ORAL_TABLET | Freq: Two times a day (BID) | ORAL | Status: DC | PRN

## 2017-10-28 MED ORDER — GABAPENTIN 100 MG PO CAPS
100.0000 mg | ORAL_CAPSULE | Freq: Two times a day (BID) | ORAL | Status: DC
  Administered 2017-10-28 – 2017-10-31 (×6): 100 mg via ORAL
  Filled 2017-10-28 (×6): qty 1

## 2017-10-28 MED ORDER — NITROGLYCERIN IN D5W 200-5 MCG/ML-% IV SOLN
0.0000 ug/min | INTRAVENOUS | Status: DC
  Administered 2017-10-28: 5 ug/min via INTRAVENOUS
  Filled 2017-10-28: qty 250

## 2017-10-28 MED ORDER — SODIUM CHLORIDE 0.9% FLUSH
3.0000 mL | Freq: Two times a day (BID) | INTRAVENOUS | Status: DC
  Administered 2017-10-28 – 2017-10-30 (×3): 3 mL via INTRAVENOUS

## 2017-10-28 MED ORDER — ACETAMINOPHEN 325 MG PO TABS
650.0000 mg | ORAL_TABLET | ORAL | Status: DC | PRN
  Administered 2017-10-29 – 2017-10-30 (×5): 650 mg via ORAL
  Filled 2017-10-28 (×5): qty 2

## 2017-10-28 MED ORDER — CLOPIDOGREL BISULFATE 75 MG PO TABS
75.0000 mg | ORAL_TABLET | Freq: Every day | ORAL | Status: DC
  Administered 2017-10-28 – 2017-10-31 (×4): 75 mg via ORAL
  Filled 2017-10-28 (×4): qty 1

## 2017-10-28 MED ORDER — METHYLPHENIDATE HCL 5 MG PO TABS
10.0000 mg | ORAL_TABLET | Freq: Three times a day (TID) | ORAL | Status: DC
  Administered 2017-10-29 – 2017-10-30 (×6): 10 mg via ORAL
  Filled 2017-10-28 (×7): qty 2

## 2017-10-28 NOTE — Progress Notes (Signed)
ANTICOAGULATION CONSULT NOTE - Initial Consult  Pharmacy Consult for heparin Indication: chest pain/ACS  Allergies  Allergen Reactions  . Ciprofloxacin Hcl Other (See Comments)    Burning in ear when drops were used  . Aspirin Other (See Comments)    Upset stomach  Upset stomach   . Gabapentin Other (See Comments)    Weakness and dizziness with higher doses. Weakness and dizziness with higher doses. Weakness and dizziness  . Wellbutrin [Bupropion] Other (See Comments)    Altered mental status    Patient Measurements:   Heparin Dosing Weight: 70.3 kg  Vital Signs: Temp: 98.2 F (36.8 C) (04/26 1149) Temp Source: Oral (04/26 1149) BP: 126/67 (04/26 2030) Pulse Rate: 62 (04/26 2030)  Labs: Recent Labs    10/28/17 1115  HGB 13.9  HCT 40.8  PLT 190  CREATININE 0.89    Estimated Creatinine Clearance: 80.1 mL/min (by C-G formula based on SCr of 0.89 mg/dL).   Medical History: Past Medical History:  Diagnosis Date  . Arthritis   . Bronchitis    history of  . 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 GERD (gastroesophageal reflux disease)   . Hiatal hernia   . 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 Hypercalcemia   . Lipoma    left forearm (3)  . Mitral valve prolapse    does not see cardiologist for. last stress test 2003  . Pneumonia 2009  . Tumor cells, benign    lung, left side  . Unstable angina ([place]) 10/28/2017    Medications:  Scheduled:  . clopidogrel  75 mg Oral Daily  . famotidine  20 mg Oral QHS  . FLUoxetine  40 mg Oral Daily  . gabapentin  100 mg Oral BID  . loratadine  10 mg Oral Daily  . methylphenidate  10 mg Oral TID  . [START ON 10/29/2017] multivitamin with minerals  1 tablet Oral Daily  . nitroGLYCERIN  0.5 inch Topical Q6H  . [START ON 10/29/2017] pantoprazole  40 mg Oral BID AC  . PROBIOTIC DAILY  2 capsule Oral Daily  . simvastatin  20 mg Oral QHS  . [START ON 10/29/2017] tamsulosin  0.4 mg Oral Q lunch    Assessment: 67 yo male  presented to ED on 4/26 with chest pain and SOB. CXR shows no acute processes. No home anticoag.   Goal of Therapy:  Heparin level 0.3-0.7 units/ml Monitor platelets by anticoagulation protocol: Yes  Plan:  Give 4,000 units bolus x 1 Start heparin infusion at 800 units/hr Check anti-Xa level in 6 hours and daily while on heparin Continue to monitor H&H and platelets  Makina Skow A Tequisha Maahs 10/28/2017,8:43 PM

## 2017-10-28 NOTE — Telephone Encounter (Signed)
New message   Pt c/o of Chest Pain: STAT if CP now or developed within 24 hours  1. Are you having CP right now? NO pain at this time. Patient did have pain around 1am  2. Are you experiencing any other symptoms (ex. SOB, nausea, vomiting, sweating)? NO  3. How long have you been experiencing CP? Started around 1am. No longer having symptoms  4. Is your CP continuous or coming and going? unknown  5. Have you taken Nitroglycerin? YES, 2 tablets ?

## 2017-10-28 NOTE — ED Notes (Signed)
Pt reports he has had pains in his chest for the past 9 days. Pt thought it was GERD and so he went to his GI doctor on Tuesday who then performed an endoscopy on Wednesday. Pt's GI doctor referred him to a cardiologist and he saw him on Thursday who in turn prescribed him nitro to take in case he has those pains again. Pt reports having 2 bouts of chest pain that started when he woke up and only last 15 mins. Pt also reports taking 2 nitro for those bouts of pain and same worked both times. Pt then reports a bout of pain in the lobby but same went away.

## 2017-10-28 NOTE — Addendum Note (Signed)
Addended by: Emmaline Life on: 10/28/2017 02:04 PM   Modules accepted: Orders

## 2017-10-28 NOTE — Telephone Encounter (Signed)
Called patient to get more information about his symptoms. He states he wrote down the timeline of events from this morning: Chest pain started @ 0151 am  Took 1st SL NTG @ 0152, rates pain 3 on 0-10 scale Pain lessened after 1st pill to level 2 Took 2nd SL NTG @ 0158  @0210  rates pain level 1 States no pain currently; states this episode was different in that he had left arm pain that radiated from axillary area down to his wrist. I advised that Dr. Acie Fredrickson, who is in the office today, advised that he go to the ED for evaluation. Patient verbalized understanding and agreement and thanked me for the call.

## 2017-10-28 NOTE — ED Provider Notes (Addendum)
[facility] Provider Note   CSN: [id number] Arrival date & time: 10/28/17  1136     History   Chief Complaint Chief Complaint  Patient presents with  . Chest Pain    HPI [name] is a 67 y.o. male.  HPI  67 yo male with sscp x 9 days.  Last night awoke with sharp sscp and radiated down arm.  Previous episodes occurred Gomez in am after awakening lasting 10-25 minutes with associated nausea, but no vomiting.  PMD Dr. Stephanie Acre.   HO Barrett's and called GI Dr. Oletta Lamas endoscopy done Wednesday- no acute and arranged f/u with Dr. Acie Fredrickson.  Gave nitro and took 2 today and relieved pain Was 3 and waited 6 minutes and pain relieved after second.  Called Dr. Lanny [facility] and told to come to ED.  States  Mild attack here- lasted 15 minutes   Past Medical History:  Diagnosis Date  . Arthritis   . Bronchitis    history of  . 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 GERD (gastroesophageal reflux disease)   . Hiatal hernia   . 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 Hypercalcemia   . Lipoma    left forearm (3)  . Mitral valve prolapse    does not see cardiologist for. last stress test 2003  . Pneumonia 2009  . Tumor cells, benign    lung, left side    Patient Active Problem List   Diagnosis Date Noted  . Bilateral inguinal hernia 11/04/2016  . Mobitz type 1 second degree atrioventricular block 03/16/2016  . Syncope 03/15/2016  . Malnutrition of moderate degree 10/21/2015  . Hyperkinetic disorder   . Choreiform movements 10/20/2015  . Chronic respiratory failure with hypoxia ([place]) 09/14/2015  . Cough   . Acute respiratory distress 09/04/2015  . Barrett's esophagus 09/04/2015  . Dyspnea on exertion 09/04/2015  . Influenza   . CAP (community acquired pneumonia) 09/03/2015  . Upper airway cough syndrome 08/21/2015  . Chest pain 05/13/2015  . Bilateral inguinal hernia (BIH) 01/08/2014  . Ileus ([place]) 08/18/2012  . Pain due to Left  hip joint prosthesis 06/13/2011  . Hyperparathyroidism, primary, s/p MI parathyroidectomy 7/18, 3.1 gm adenoma 02/19/2011  . LIPOMAS, MULTIPLE 10/24/2007  . HLD (hyperlipidemia) 10/24/2007  . Depression 10/24/2007  . ADD (attention deficit disorder) 10/24/2007  . OTITIS MEDIA 10/24/2007  . SINUSITIS, CHRONIC 10/24/2007  . PNEUMONIA, RECURRENT 10/24/2007  . HIP FRACTURE, LEFT 10/24/2007  . GENITAL HERPES, HX OF 10/24/2007  . TOBACCO USE, QUIT 10/24/2007  . HIP REPLACEMENT, TOTAL, HX OF 10/24/2007  . Other acquired absence of organ 10/24/2007  . G E R D 07/13/2007    Past Surgical History:  Procedure Laterality Date  . APPENDECTOMY  1984  . CATARACT EXTRACTION     L and R eye  . CHOLECYSTECTOMY  2002  . HYDROCELE EXCISION Right 11/04/2016   Procedure: HYDROCELECTOMY ADULT;  Surgeon: Franchot Gallo, MD;  Location: [facility];  Service: Urology;  Laterality: Right;  . INGUINAL HERNIA REPAIR Bilateral 11/04/2016   Procedure: HERNIA REPAIR INGUINAL ADULT BILATERAL;  Surgeon: Jackolyn Confer, MD;  Location: [facility];  Service: General;  Laterality: Bilateral;  . INSERTION OF MESH Bilateral 11/04/2016   Procedure: INSERTION OF MESH;  Surgeon: Jackolyn Confer, MD;  Location: [facility];  Service: General;  Laterality: Bilateral;  . JOINT REPLACEMENT     Lt hip  . MASTOIDECTOMY  1996  . NASAL SEPTUM  SURGERY     sinus surgery  . PARATHYROIDECTOMY    . TOTAL HIP REVISION  06/14/2011   Procedure: TOTAL HIP REVISION;  Surgeon: Kerin Salen;  Location: [facility];  Service: Orthopedics;  Laterality: Left;  Left Acetabular  Hip Revision        Home Medications    Prior to Admission medications   Medication Sig Start Date End Date Taking? Authorizing Provider  cetirizine (ZYRTEC) 10 MG tablet Take 10 mg by mouth at bedtime.   Yes [provider]  famotidine (PEPCID) 20 MG tablet Take 1 tablet (20 mg total) by mouth at bedtime. 09/09/15  Yes Reyne Dumas, MD  FLUoxetine (PROZAC) 40 MG capsule  Take 40 mg by mouth daily.    Yes [provider]  gabapentin (NEURONTIN) 100 MG capsule TAKE 1 CAPSULE BY MOUTH TWICE DAILY 01/20/17  Yes Tanda Rockers, MD  methylphenidate (RITALIN) 10 MG tablet Take 10 mg by mouth 3 (three) times daily.     Yes [provider]  Multiple Vitamin (MULTIVITAMIN WITH MINERALS) TABS tablet Take 1 tablet by mouth daily.   Yes [provider]  nitroGLYCERIN (NITROSTAT) 0.4 MG SL tablet Place 1 tablet (0.4 mg total) under the tongue every 5 (five) minutes as needed for chest pain. DO NOT EXCEED 3 DOSES at one time 10/27/17 01/25/18 Yes Nahser, Wonda Cheng, MD  omeprazole (PRILOSEC) 20 MG capsule Take 40 mg by mouth 2 (two) times daily before a meal.    Yes [provider]  polyethylene glycol (MIRALAX / GLYCOLAX) packet Take 17 g by mouth daily as needed (for constipation.).    Yes [provider]  Probiotic Product (PROBIOTIC DAILY) CAPS Take 2 capsules by mouth daily. Reported on 08/21/2015   Yes [provider]  simvastatin (ZOCOR) 20 MG tablet Take 20 mg by mouth at bedtime.     Yes [provider]  tamsulosin (FLOMAX) 0.4 MG CAPS capsule Take 0.4 mg by mouth daily with lunch.    Yes [provider]  metoprolol tartrate (LOPRESSOR) 50 MG tablet Take 1 tablet (50 mg total) by mouth once for 1 dose. Take 1 hour prior to your Coronary CT 10/27/17 10/27/17  Nahser, Wonda Cheng, MD    Family History Family History  Problem Relation Age of Onset  . Lung cancer Father 19       smoked  . Heart Problems Father   . Cancer Brother        prostate  . Heart Problems Mother     Social History Social History   Tobacco Use  . Smoking status: Former Smoker    Packs/day: 1.50    Years: 24.00    Pack years: 36.00    Types: Cigarettes    Last attempt to quit: 07/06/1987    Years since quitting: 30.3  . Smokeless tobacco: Former Network engineer Use Topics  . Alcohol use: Yes    Alcohol/week: 0.6 oz     Types: 1 Cans of beer per week  . Drug use: No     Allergies   Ciprofloxacin hcl; Aspirin; Gabapentin; and Wellbutrin [bupropion]   Review of Systems Review of Systems  Constitutional: Negative for activity change, appetite change, chills and fever.  HENT: Negative.   Eyes: Negative.   Respiratory: Positive for shortness of breath.   Cardiovascular: Positive for chest pain. Negative for palpitations and leg swelling.  Gastrointestinal: Positive for diarrhea and nausea.  Endocrine: Negative.   Genitourinary: Negative.  Musculoskeletal: Negative.   Allergic/Immunologic: Negative.   Neurological: Negative.   Hematological: Negative.   Psychiatric/Behavioral: Negative.   All other systems reviewed and are negative.    Physical Exam Updated Vital Signs BP 108/66 (BP Location: Right Arm)   Pulse (!) 55   Temp 98.2 F (36.8 C) (Oral)   Resp 14   SpO2 100%   Physical Exam  Constitutional: He appears well-developed and well-nourished. He does not appear ill.  HENT:  Head: Normocephalic and atraumatic.  Eyes: Pupils are equal, round, and reactive to light. EOM are normal.  Neck: Normal range of motion. Neck supple.  Cardiovascular: Normal rate and regular rhythm.  Pulses:      Carotid pulses are 1+ on the right side, and 1+ on the left side.      Radial pulses are 1+ on the right side, and 1+ on the left side.       Dorsalis pedis pulses are 1+ on the right side, and 1+ on the left side.  Pulmonary/Chest: Effort normal and breath sounds normal.  Abdominal: Soft. Bowel sounds are normal. There is no tenderness.  Musculoskeletal: Normal range of motion.       Right lower leg: Normal.       Left lower leg: Normal.  Neurological: He is alert.  Skin: Skin is warm and dry. Capillary refill takes less than 2 seconds.  Psychiatric: He has a normal mood and affect.  Vitals reviewed.    ED Treatments / Results  Labs (all labs ordered are listed, but only abnormal results  are displayed) Labs Reviewed  BASIC METABOLIC PANEL - Abnormal; Notable for the following components:      Result Value   Glucose, Bld 107 (*)    Calcium 8.8 (*)    All other components within normal limits  CBC  I-STAT TROPONIN, ED  I-STAT TROPONIN, ED    EKG EKG Interpretation  Date/Time:  Friday October 28 2017 18:12:36 EDT Ventricular Rate:  70 PR Interval:  142 QRS Duration: 84 QT Interval:  448 QTC Calculation: 483 R Axis:   87 Text Interpretation:  Normal sinus rhythm T wave abnormality, consider inferior ischemia Abnormal ECG t wave inversion in II and avf Confirmed by Pattricia Boss [id number]) on 10/28/2017 7:23:55 PM   Radiology Dg Chest 2 View  Result Date: 10/28/2017 CLINICAL DATA:  Chest pain with radiation right arm. EXAM: CHEST - 2 VIEW COMPARISON:  05/10/2016.  CT 03/15/2016. FINDINGS: Mediastinum hilar structures are normal. Bilateral pleural-parenchymal thickening noted consistent with scarring. Heart size normal. Degenerative change thoracic spine. IMPRESSION: 1. Stable bilateral pleural-parenchymal thickening consistent scarring. 2.  No acute pulmonary disease. Electronically Signed   By: Marcello Moores  Register   On: 10/28/2017 14:54    Procedures Procedures (including critical care time)  Medications Ordered in ED Medications - No data to display   Initial Impression / Assessment and Plan / ED Course  I have reviewed the triage vital signs and the nursing notes.  Pertinent labs & imaging results that were available during my care of the patient were reviewed by me and considered in my medical decision making (see chart for details).    67 yo male with sscp daily for 9 days.  Seen by cardioloy yesterday, today took nitro and relieved pain.  Episode of chest pain here, and repeat ekg done as subsided.  Repeat ekg with more pronounced t wave inversion Avl and III. Patient now pain free.  Plan consult to cardiology  Final Clinical  Impressions(s) / ED Diagnoses    Final diagnoses:  Chest pain, unspecified type  Nonspecific abnormal electrocardiogram (ECG) (EKG)    ED Discharge Orders    None       Pattricia Boss, MD 10/29/17 3159    Pattricia Boss, MD 10/31/17 1400

## 2017-10-28 NOTE — Telephone Encounter (Signed)
Outpatient Medication Detail    Disp Refills Start End   metoprolol tartrate (LOPRESSOR) 50 MG tablet 1 tablet 0 10/27/2017 10/27/2017   Sig - Route: Take 1 tablet (50 mg total) by mouth once for 1 dose. Take 1 hour prior to your Coronary CT - Oral   Sent to pharmacy as: metoprolol tartrate (LOPRESSOR) 50 MG tablet   E-Prescribing Status: Receipt confirmed by pharmacy (10/27/2017 4:08 PM EDT)   Medication Notes      Corky Mull  Fri Oct 28, 2017 12:30 PM Waiting for cardio test to take this medication        Pharmacy   [id number] - [place], [place] - [address] AT [address]

## 2017-10-28 NOTE — ED Triage Notes (Signed)
Pt c/o mid radiating CP radiating to R arm last night, CP ongoing x 9 days, pt seen by cardiologist yesterday and given nitro, pt reports taking x2 sL nitro last night with relief, pt scheduled CT of the chest, but came today d/t CP last night, pt denies current CP, SOB, n/v/d

## 2017-10-28 NOTE — H&P (Signed)
Cardiology Admission History and Physical:   Patient ID: [name]; MRN: [id number]; DOB: 09/12/1950   Admission date: 10/28/2017  Primary Care Provider: Jonathon Jordan, MD Primary Cardiologist: Mertie Moores, MD 10/27/2017 Primary Electrophysiologist:  n/a  Chief Complaint:  Chest pain  Patient Profile:   [name] is a 67 y.o. male with a history of CP, GI eval negative, MVP, GERD, OA presents with progressive chest pain.   History of Present Illness:   [name]  67 yo male history of chest pain with recent negative GI evaluation. Seen by Dr Acie Fredrickson yesterday for symptoms with plans for coronary CTA as outpatient, presents today with progressing chest pain symptoms at home  Reports 9 day history of chest pain. Sharp pain mid chest up to 10/10. Can occur at rest or with activity, usually lasts about 15-20%. Associated SOB, nausea, fatigue. Symptoms have been progressing in severity over the last few days. Today severe pain radiated down his left arm which is new for him. He took NG at home with improvement of symptoms.   K 4, Cr 0.89 WBC 5.8 Hgb 13.9 Plt 190 Trop neg x 2 CXR no acute process  EKG SR. Dynamic inferior/lateral precordial TWI (compare 4/26 1147 ekg and 1812 ekg) 03/2016 echo: LVEF 50-55%       Past Medical History:  Diagnosis Date  . Arthritis   . Bronchitis    history of  . 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 GERD (gastroesophageal reflux disease)   . Hiatal hernia   . 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 Hypercalcemia   . Lipoma    left forearm (3)  . Mitral valve prolapse    does not see cardiologist for. last stress test 2003  . Pneumonia 2009  . Tumor cells, benign    lung, left side    Past Surgical History:  Procedure Laterality Date  . APPENDECTOMY  1984  . CATARACT EXTRACTION     L and R eye  . CHOLECYSTECTOMY  2002  . HYDROCELE EXCISION Right 11/04/2016   Procedure: HYDROCELECTOMY ADULT;  Surgeon: Franchot Gallo,  MD;  Location: [facility];  Service: Urology;  Laterality: Right;  . INGUINAL HERNIA REPAIR Bilateral 11/04/2016   Procedure: HERNIA REPAIR INGUINAL ADULT BILATERAL;  Surgeon: Jackolyn Confer, MD;  Location: [facility];  Service: General;  Laterality: Bilateral;  . INSERTION OF MESH Bilateral 11/04/2016   Procedure: INSERTION OF MESH;  Surgeon: Jackolyn Confer, MD;  Location: [facility];  Service: General;  Laterality: Bilateral;  . JOINT REPLACEMENT     Lt hip  . MASTOIDECTOMY  1996  . NASAL SEPTUM SURGERY     sinus surgery  . PARATHYROIDECTOMY    . TOTAL HIP REVISION  06/14/2011   Procedure: TOTAL HIP REVISION;  Surgeon: Kerin Salen;  Location: [facility];  Service: Orthopedics;  Laterality: Left;  Left Acetabular  Hip Revision     Medications Prior to Admission: Prior to Admission medications   Medication Sig Start Date End Date Taking? Authorizing Provider  cetirizine (ZYRTEC) 10 MG tablet Take 10 mg by mouth at bedtime.   Yes [provider]  famotidine (PEPCID) 20 MG tablet Take 1 tablet (20 mg total) by mouth at bedtime. 09/09/15  Yes Reyne Dumas, MD  FLUoxetine (PROZAC) 40 MG capsule Take 40 mg by mouth daily.    Yes [provider]  gabapentin (NEURONTIN) 100 MG capsule TAKE 1 CAPSULE BY MOUTH TWICE DAILY 01/20/17  Yes  Tanda Rockers, MD  methylphenidate (RITALIN) 10 MG tablet Take 10 mg by mouth 3 (three) times daily.     Yes [provider]  Multiple Vitamin (MULTIVITAMIN WITH MINERALS) TABS tablet Take 1 tablet by mouth daily.   Yes [provider]  nitroGLYCERIN (NITROSTAT) 0.4 MG SL tablet Place 1 tablet (0.4 mg total) under the tongue every 5 (five) minutes as needed for chest pain. DO NOT EXCEED 3 DOSES at one time 10/27/17 01/25/18 Yes Nahser, Wonda Cheng, MD  omeprazole (PRILOSEC) 20 MG capsule Take 40 mg by mouth 2 (two) times daily before a meal.    Yes [provider]  polyethylene glycol (MIRALAX / GLYCOLAX) packet Take 17 g by mouth daily as  needed (for constipation.).    Yes [provider]  Probiotic Product (PROBIOTIC DAILY) CAPS Take 2 capsules by mouth daily. Reported on 08/21/2015   Yes [provider]  simvastatin (ZOCOR) 20 MG tablet Take 20 mg by mouth at bedtime.     Yes [provider]  tamsulosin (FLOMAX) 0.4 MG CAPS capsule Take 0.4 mg by mouth daily with lunch.    Yes [provider]  metoprolol tartrate (LOPRESSOR) 50 MG tablet Take 1 tablet (50 mg total) by mouth once for 1 dose. Take 1 hour prior to your Coronary CT 10/27/17 10/27/17  Nahser, Wonda Cheng, MD     Allergies:    Allergies  Allergen Reactions  . Ciprofloxacin Hcl Other (See Comments)    Burning in ear when drops were used  . Aspirin Other (See Comments)    Upset stomach  Upset stomach   . Gabapentin Other (See Comments)    Weakness and dizziness with higher doses. Weakness and dizziness with higher doses. Weakness and dizziness  . Wellbutrin [Bupropion] Other (See Comments)    Altered mental status    Social History:   Social History   Socioeconomic History  . Marital status: Married    Spouse name: Arville Go  . Number of children: 3  . Years of education: masters  . Highest education level: Not on file  Occupational History    Comment: Sales  Social Needs  . Financial resource strain: Not on file  . Food insecurity:    Worry: Not on file    Inability: Not on file  . Transportation needs:    Medical: Not on file    Non-medical: Not on file  Tobacco Use  . Smoking status: Former Smoker    Packs/day: 1.50    Years: 24.00    Pack years: 36.00    Types: Cigarettes    Last attempt to quit: 07/06/1987    Years since quitting: 30.3  . Smokeless tobacco: Former Network engineer and Sexual Activity  . Alcohol use: Yes    Alcohol/week: 0.6 oz    Types: 1 Cans of beer per week  . Drug use: No  . Sexual activity: Not on file  Lifestyle  . Physical activity:    Days per week: Not on file    Minutes per  session: Not on file  . Stress: Not on file  Relationships  . Social connections:    Talks on phone: Not on file    Gets together: Not on file    Attends religious service: Not on file    Active member of club or organization: Not on file    Attends meetings of clubs or organizations: Not on file    Relationship status: Not on file  .  Intimate partner violence:    Fear of current or ex partner: Not on file    Emotionally abused: Not on file    Physically abused: Not on file    Forced sexual activity: Not on file  Other Topics Concern  . 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   Caffeine - one cup daily.    Family History:   The patient's family history includes Cancer in his brother; Heart Problems in his father and mother; Lung cancer (age of onset: 42) in his father.   The patient indicated that his mother is alive. He indicated that his father is deceased. He indicated that his sister is alive. He indicated that both of his brothers are alive.    ROS:  Please see the history of present illness.  All other ROS reviewed and negative.     Physical Exam/Data:   Vitals:   10/28/17 1149 10/28/17 1619  BP: 125/71 108/66  Pulse: 73 (!) 55  Resp: 14 14  Temp: 98.2 F (36.8 C)   TempSrc: Oral   SpO2: 98% 100%   No intake or output data in the 24 hours ending 10/28/17 1957 There were no vitals filed for this visit. There is no height or weight on file to calculate BMI.  General:  Well nourished, well developed, in no acute distress HEENT: normal Lymph: no adenopathy Neck:  JVD  not elevated Endocrine:  No thryomegaly Cardiac:  normal S1, S2; RRR; no murmur, no rub or gallop  Lungs:  clear to auscultation bilaterally, no wheezing, rhonchi or rales  Abd: soft, nontender, no hepatomegaly  Ext: no edema Musculoskeletal:  No deformities, BUE and BLE strength normal  and equal Skin: warm and dry  Neuro:  CNs 2-12 intact, no focal abnormalities noted Psych:  Normal affect      Laboratory Data:  Chemistry Recent Labs  Lab 10/28/17 1115  NA 137  K 4.0  CL 102  CO2 27  GLUCOSE 107*  BUN 16  CREATININE 0.89  CALCIUM 8.8*  GFRNONAA >60  GFRAA >60  ANIONGAP 8    No results for input(s): PROT, ALBUMIN, AST, ALT, ALKPHOS, BILITOT in the last 168 hours. Hematology Recent Labs  Lab 10/28/17 1115  WBC 5.8  RBC 4.45  HGB 13.9  HCT 40.8  MCV 91.7  MCH 31.2  MCHC 34.1  RDW 12.5  PLT 190   Cardiac EnzymesNo results for input(s): TROPONINI in the last 168 hours.  Recent Labs  Lab 10/28/17 1218 10/28/17 1755  TROPIPOC 0.00 0.00    BNPNo results for input(s): BNP, PROBNP in the last 168 hours.  DDimer No results for input(s): DDIMER in the last 168 hours.  Radiology/Studies:  Dg Chest 2 View  Result Date: 10/28/2017 CLINICAL DATA:  Chest pain with radiation right arm. EXAM: CHEST - 2 VIEW COMPARISON:  05/10/2016.  CT 03/15/2016. FINDINGS: Mediastinum hilar structures are normal. Bilateral pleural-parenchymal thickening noted consistent with scarring. Heart size normal. Degenerative change thoracic spine. IMPRESSION: 1. Stable bilateral pleural-parenchymal thickening consistent scarring. 2.  No acute pulmonary disease. Electronically Signed   By: Marcello Moores  Register   On: 10/28/2017 14:54    Assessment and Plan:   1. Chest pain/unstable angina - symptoms concerning for unstable angina. Seen by Dr Acie Fredrickson yesterday with progressing symptoms, plans were for outpatient coronary CTA. Progressive symptoms caused him to come  to ER tonight - enzymes negative initially. Dynamic TWIs in inferior and lateral precordial leads ( compare 4/26 1147 ekg and 1812 ekg). Symptoms respond to NG - start hep gtt. Cycle enzymes and EKGs overnight, echo tomorrow AM - he reports stomach issues with asprin in the past, will use plavix at this time. He notes if he  had a stent he could take aspirin if neccesary - make npo tonight in case ischemic tesitng tomorrow, I would likely plan for cath Monday.      For questions or updates, please contact [facility] Please consult [email or web address] for contact info under Cardiology/STEMI.    Signed, Carlyle Dolly MD

## 2017-10-29 ENCOUNTER — Other Ambulatory Visit: Payer: Self-pay

## 2017-10-29 ENCOUNTER — Inpatient Hospital Stay (HOSPITAL_COMMUNITY): Payer: Medicare Other

## 2017-10-29 LAB — LIPID PANEL
CHOLESTEROL: 128 mg/dL (ref 0–200)
HDL: 38 mg/dL — ABNORMAL LOW (ref >40)
LDL Cholesterol: 76 mg/dL (ref 0–99)
TRIGLYCERIDES: 69 mg/dL (ref <150)
Total CHOL/HDL Ratio: 3.4 RATIO
VLDL: 14 mg/dL (ref 0–40)

## 2017-10-29 LAB — ECHOCARDIOGRAM COMPLETE
HEIGHTINCHES: 71 in
Weight: 2427.2 oz

## 2017-10-29 LAB — COMPREHENSIVE METABOLIC PANEL
ALBUMIN: 3.2 g/dL — AB (ref 3.5–5.0)
ALT: 15 U/L — AB (ref 17–63)
AST: 17 U/L (ref 15–41)
Alkaline Phosphatase: 52 U/L (ref 38–126)
Anion gap: 8 (ref 5–15)
BILIRUBIN TOTAL: 0.8 mg/dL (ref 0.3–1.2)
BUN: 14 mg/dL (ref 6–20)
CHLORIDE: 105 mmol/L (ref 101–111)
CO2: 27 mmol/L (ref 22–32)
CREATININE: 0.85 mg/dL (ref 0.61–1.24)
Calcium: 8.6 mg/dL — ABNORMAL LOW (ref 8.9–10.3)
GFR calc Af Amer: >60 mL/min (ref >60)
GLUCOSE: 83 mg/dL (ref 65–99)
POTASSIUM: 3.6 mmol/L (ref 3.5–5.1)
Sodium: 140 mmol/L (ref 135–145)
TOTAL PROTEIN: 5.5 g/dL — AB (ref 6.5–8.1)

## 2017-10-29 LAB — CBC
HEMATOCRIT: 37.5 % — AB (ref 39.0–52.0)
Hemoglobin: 12.6 g/dL — ABNORMAL LOW (ref 13.0–17.0)
MCH: 30.4 pg (ref 26.0–34.0)
MCHC: 33.6 g/dL (ref 30.0–36.0)
MCV: 90.4 fL (ref 78.0–100.0)
Platelets: 191 10*3/uL (ref 150–400)
RBC: 4.15 MIL/uL — AB (ref 4.22–5.81)
RDW: 12.2 % (ref 11.5–15.5)
WBC: 5 10*3/uL (ref 4.0–10.5)

## 2017-10-29 LAB — TROPONIN I: Troponin I: <0.03 ng/mL (ref <0.03)

## 2017-10-29 LAB — PROTIME-INR
INR: 1.06
PROTHROMBIN TIME: 13.7 s (ref 11.4–15.2)

## 2017-10-29 LAB — HEPARIN LEVEL (UNFRACTIONATED)
HEPARIN UNFRACTIONATED: 0.28 [IU]/mL — AB (ref 0.30–0.70)
HEPARIN UNFRACTIONATED: 0.32 [IU]/mL (ref 0.30–0.70)

## 2017-10-29 NOTE — Plan of Care (Signed)
  Problem: Education: Goal: Knowledge of General Education information will improve Outcome: Completed/Met  Pt oriented to unit, plan of care, and method of reporting concerns. Call light and bedside table within reach. 

## 2017-10-29 NOTE — Plan of Care (Signed)
VSS. Heparin gtt continued. SR/SB on Monitor.

## 2017-10-29 NOTE — Progress Notes (Signed)
[facility] for Heparin Indication: chest pain/ACS  Allergies  Allergen Reactions  . Ciprofloxacin Hcl Other (See Comments)    Burning in ear when drops were used  . Aspirin Other (See Comments)    Upset stomach  Upset stomach   . Gabapentin Other (See Comments)    Weakness and dizziness with higher doses. Weakness and dizziness with higher doses. Weakness and dizziness  . Wellbutrin [Bupropion] Other (See Comments)    Altered mental status    Patient Measurements: Height: 5\' 11"  (180.3 cm) Weight: 151 lb 11.2 oz (68.8 kg) IBW/kg (Calculated) : 75.3 Heparin Dosing Weight: 70.3 kg  Vital Signs: Temp: 98.3 F (36.8 C) (04/27 0354) Temp Source: Oral (04/27 0354) BP: 91/61 (04/27 0354) Pulse Rate: 54 (04/27 0354)  Labs: Recent Labs    10/28/17 1115 10/28/17 2106 10/29/17 0352  HGB 13.9  --  12.6*  HCT 40.8  --  37.5*  PLT 190  --  191  LABPROT  --   --  13.7  INR  --   --  1.06  HEPARINUNFRC  --   --  0.28*  CREATININE 0.89  --   --   TROPONINI  --  <0.03  --     Estimated Creatinine Clearance: 78.4 mL/min (by C-G formula based on SCr of 0.89 mg/dL).   Medical History: Past Medical History:  Diagnosis Date  . Arthritis   . Bronchitis    history of  . 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 GERD (gastroesophageal reflux disease)   . Hiatal hernia   . 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 Hypercalcemia   . Lipoma    left forearm (3)  . Mitral valve prolapse    does not see cardiologist for. last stress test 2003  . Pneumonia 2009  . Tumor cells, benign    lung, left side  . Unstable angina ([place]) 10/28/2017    Medications:  Scheduled:  . acidophilus  2 capsule Oral Daily  . [START ON 10/31/2017] aspirin  81 mg Oral Pre-Cath  . clopidogrel  75 mg Oral Daily  . famotidine  20 mg Oral QHS  . FLUoxetine  40 mg Oral Daily  . gabapentin  100 mg Oral BID  . loratadine  10 mg Oral Daily  . methylphenidate  10 mg Oral  TID  . multivitamin with minerals  1 tablet Oral Daily  . nitroGLYCERIN  0.5 inch Topical Q6H  . pantoprazole  40 mg Oral BID AC  . simvastatin  20 mg Oral QHS  . sodium chloride flush  3 mL Intravenous Q12H  . sodium chloride flush  3 mL Intravenous Q12H  . tamsulosin  0.4 mg Oral Q lunch    Assessment: 67 yo male presented to ED on 4/26 with chest pain and SOB. CXR shows no acute processes. No home anticoag.   Heparin level low this AM, no issues per RN.   Goal of Therapy:  Heparin level 0.3-0.7 units/ml Monitor platelets by anticoagulation protocol: Yes  Plan:  Inc heparin to 950 units/hr 1200 HL Possible cath Luisa Hart 10/29/2017,4:30 AM

## 2017-10-29 NOTE — Progress Notes (Signed)
Subjective:  Last chest discomfort was yesterday afternoon.  No chest pain overnight on heparin.  Objective:  Vital Signs in the last 24 hours: BP 91/61 (BP Location: Left Arm)   Pulse (!) 54   Temp 98.3 F (36.8 C) (Oral)   Resp 16   Ht 5\' 11"  (1.803 m)   Wt 68.8 kg (151 lb 11.2 oz)   SpO2 97%   BMI 21.16 kg/m   Physical Exam: Somewhat anxious male in no acute distress Lungs:  Clear Cardiac:  Regular rhythm, normal S1 and S2, no S3 Extremities:  No edema present  Intake/Output from previous day: 04/26 0701 - 04/27 0700 In: 56 [I.V.:56] Out: -   Weight Filed Weights   10/29/17 0006  Weight: 68.8 kg (151 lb 11.2 oz)    Lab Results: Basic Metabolic Panel: Recent Labs    10/28/17 1115 10/29/17 0352  NA 137 140  K 4.0 3.6  CL 102 105  CO2 27 27  GLUCOSE 107* 83  BUN 16 14  CREATININE 0.89 0.85   CBC: Recent Labs    10/28/17 1115 10/29/17 0352  WBC 5.8 5.0  HGB 13.9 12.6*  HCT 40.8 37.5*  MCV 91.7 90.4  PLT 190 191   Cardiac Enzymes: Troponin (Point of Care Test) Recent Labs    10/28/17 1755  TROPIPOC 0.00   Cardiac Panel (last 3 results) Recent Labs    10/28/17 2106 10/29/17 0352  TROPONINI <0.03 <0.03    Telemetry: Sinus rhythm  Assessment/Plan:  1.  Chest pain consistent with unstable angina with dynamic T-wave changes 2.  Hyperlipidemia  Recommendations:  His EKG this morning personally reviewed by me shows resolution of the T-wave changes inferolaterally.  At this point I think he needs to go directly to catheterization as opposed to coronary CTA.  Continue Plavix.  Continue heparin. Cardiac catheterization was discussed with the patient fully including risks of myocardial infarction, death, stroke, bleeding, arrhythmia, dye allergy, renal insufficiency or bleeding.  The patient understands and is willing to proceed.  Possibility of intervention at the same time also discussed with patient and they understand and are agreeable to  proceed   W. Doristine Church  MD [facility]  10/29/2017, 9:36 AM

## 2017-10-29 NOTE — Progress Notes (Signed)
  Echocardiogram 2D Echocardiogram has been performed.  Jannett Celestine 10/29/2017, 9:10 AM

## 2017-10-30 LAB — CBC
HEMATOCRIT: 37 % — AB (ref 39.0–52.0)
Hemoglobin: 12.7 g/dL — ABNORMAL LOW (ref 13.0–17.0)
MCH: 31 pg (ref 26.0–34.0)
MCHC: 34.3 g/dL (ref 30.0–36.0)
MCV: 90.2 fL (ref 78.0–100.0)
Platelets: 182 10*3/uL (ref 150–400)
RBC: 4.1 MIL/uL — ABNORMAL LOW (ref 4.22–5.81)
RDW: 12.2 % (ref 11.5–15.5)
WBC: 5.4 10*3/uL (ref 4.0–10.5)

## 2017-10-30 LAB — HEPARIN LEVEL (UNFRACTIONATED): Heparin Unfractionated: 0.39 IU/mL (ref 0.30–0.70)

## 2017-10-30 MED ORDER — SODIUM CHLORIDE 0.9 % WEIGHT BASED INFUSION: 1.0000 mL/kg/h | INTRAVENOUS | Status: DC

## 2017-10-30 MED ORDER — ASPIRIN 81 MG PO CHEW: 81.0000 mg | CHEWABLE_TABLET | ORAL | Status: AC

## 2017-10-30 MED ORDER — SODIUM CHLORIDE 0.9 % WEIGHT BASED INFUSION
3.0000 mL/kg/h | INTRAVENOUS | Status: DC
  Administered 2017-10-31: 3 mL/kg/h via INTRAVENOUS

## 2017-10-30 MED ORDER — SODIUM CHLORIDE 0.9% FLUSH: 3.0000 mL | INTRAVENOUS | Status: DC | PRN

## 2017-10-30 MED ORDER — SODIUM CHLORIDE 0.9% FLUSH: 3.0000 mL | Freq: Two times a day (BID) | INTRAVENOUS | Status: DC

## 2017-10-30 MED ORDER — SODIUM CHLORIDE 0.9 % IV SOLN: 250.0000 mL | INTRAVENOUS | Status: DC | PRN

## 2017-10-30 NOTE — H&P (View-Only) (Signed)
Subjective:  No chest discomfort overnight.  Complains of mild headache.  Objective:  Vital Signs in the last 24 hours: BP 117/69 (BP Location: Left Arm)   Pulse 62   Temp 98.2 F (36.8 C) (Oral)   Resp (!) 22   Ht 5\' 11"  (1.803 m)   Wt 68.7 kg (151 lb 6.4 oz)   SpO2 100%   BMI 21.12 kg/m   Physical Exam: Somewhat anxious male in no acute distress Lungs:  Clear Cardiac:  Regular rhythm, normal S1 and S2, no S3 Extremities:  No edema present  Intake/Output from previous day: 04/27 0701 - 04/28 0700 In: 219.1 [I.V.:219.1] Out: -   Weight Filed Weights   10/29/17 0006 10/30/17 0359  Weight: 68.8 kg (151 lb 11.2 oz) 68.7 kg (151 lb 6.4 oz)    Lab Results: Basic Metabolic Panel: Recent Labs    10/28/17 1115 10/29/17 0352  NA 137 140  K 4.0 3.6  CL 102 105  CO2 27 27  GLUCOSE 107* 83  BUN 16 14  CREATININE 0.89 0.85   CBC: Recent Labs    10/29/17 0352 10/30/17 0333  WBC 5.0 5.4  HGB 12.6* 12.7*  HCT 37.5* 37.0*  MCV 90.4 90.2  PLT 191 182   Cardiac Enzymes: Troponin (Point of Care Test) Recent Labs    10/28/17 1755  TROPIPOC 0.00   Cardiac Panel (last 3 results) Recent Labs    10/28/17 2106 10/29/17 0352 10/29/17 0925  TROPONINI <0.03 <0.03 <0.03    Telemetry: Sinus rhythm  Assessment/Plan:  1.  Chest pain consistent with unstable angina with dynamic T-wave changes 2.  Hyperlipidemia  Recommendations:  Catheterization land for tomorrow.  Risks discussed yesterday.   Kerry Hough  MD [facility]  10/30/2017, 9:45 AM

## 2017-10-30 NOTE — Progress Notes (Signed)
[place] for Heparin Indication: chest pain/ACS  Allergies  Allergen Reactions  . Ciprofloxacin Hcl Other (See Comments)    Burning in ear when drops were used  . Aspirin Other (See Comments)    Upset stomach  Upset stomach   . Gabapentin Other (See Comments)    Weakness and dizziness with higher doses. Weakness and dizziness with higher doses. Weakness and dizziness  . Wellbutrin [Bupropion] Other (See Comments)    Altered mental status   Patient Measurements: Height: 5\' 11"  (180.3 cm) Weight: 151 lb 6.4 oz (68.7 kg) IBW/kg (Calculated) : 75.3 Heparin Dosing Weight: 68.7 kg  Vital Signs: Temp: 98.2 F (36.8 C) (04/28 0402) Temp Source: Oral (04/28 0402) BP: 117/69 (04/28 0402) Pulse Rate: 62 (04/28 0402)  Labs: Recent Labs    10/28/17 1115 10/28/17 2106 10/29/17 0352 10/29/17 0925 10/29/17 1114 10/30/17 0333  HGB 13.9  --  12.6*  --   --  12.7*  HCT 40.8  --  37.5*  --   --  37.0*  PLT 190  --  191  --   --  182  LABPROT  --   --  13.7  --   --   --   INR  --   --  1.06  --   --   --   HEPARINUNFRC  --   --  0.28*  --  0.32 0.39  CREATININE 0.89  --  0.85  --   --   --   TROPONINI  --  <0.03 <0.03 <0.03  --   --    Estimated Creatinine Clearance: 81.9 mL/min (by C-G formula based on SCr of 0.85 mg/dL).  Assessment: 67 yo male presented to ED on 4/26 with chest pain and SOB. Chest pain is consistent with unstable angina with dynamic T-wave changes. Proceeding with cardiac catheterization plans. No home anticoagulation.   Heparin level therapeutic this AM, 0.39  No bleeding noted H/H stable, PLT wnl  Goal of Therapy:  Heparin level 0.3-0.7 units/ml Monitor platelets by anticoagulation protocol: Yes  Plan:  Continue heparin drip at 950 units/hour Daily heparin level, CBC Monitor for s/sx of bleeding Follow-up cardiac catheterization plans   Siobhan Zaro L. Kyung Rudd, PharmD, [place] PGY1 Pharmacy Resident Pager:  [phone]

## 2017-10-30 NOTE — Plan of Care (Signed)
  Problem: Activity: Goal: Risk for activity intolerance will decrease Outcome: Completed/Met  Bathroom privileges. Pt ambulates in room with steady gait.

## 2017-10-30 NOTE — Progress Notes (Signed)
Subjective:  No chest discomfort overnight.  Complains of mild headache.  Objective:  Vital Signs in the last 24 hours: BP 117/69 (BP Location: Left Arm)   Pulse 62   Temp 98.2 F (36.8 C) (Oral)   Resp (!) 22   Ht 5\' 11"  (1.803 m)   Wt 68.7 kg (151 lb 6.4 oz)   SpO2 100%   BMI 21.12 kg/m   Physical Exam: Somewhat anxious male in no acute distress Lungs:  Clear Cardiac:  Regular rhythm, normal S1 and S2, no S3 Extremities:  No edema present  Intake/Output from previous day: 04/27 0701 - 04/28 0700 In: 219.1 [I.V.:219.1] Out: -   Weight Filed Weights   10/29/17 0006 10/30/17 0359  Weight: 68.8 kg (151 lb 11.2 oz) 68.7 kg (151 lb 6.4 oz)    Lab Results: Basic Metabolic Panel: Recent Labs    10/28/17 1115 10/29/17 0352  NA 137 140  K 4.0 3.6  CL 102 105  CO2 27 27  GLUCOSE 107* 83  BUN 16 14  CREATININE 0.89 0.85   CBC: Recent Labs    10/29/17 0352 10/30/17 0333  WBC 5.0 5.4  HGB 12.6* 12.7*  HCT 37.5* 37.0*  MCV 90.4 90.2  PLT 191 182   Cardiac Enzymes: Troponin (Point of Care Test) Recent Labs    10/28/17 1755  TROPIPOC 0.00   Cardiac Panel (last 3 results) Recent Labs    10/28/17 2106 10/29/17 0352 10/29/17 0925  TROPONINI <0.03 <0.03 <0.03    Telemetry: Sinus rhythm  Assessment/Plan:  1.  Chest pain consistent with unstable angina with dynamic T-wave changes 2.  Hyperlipidemia  Recommendations:  Catheterization land for tomorrow.  Risks discussed yesterday.   Kerry Hough  MD [facility]  10/30/2017, 9:45 AM

## 2017-10-31 ENCOUNTER — Inpatient Hospital Stay (HOSPITAL_COMMUNITY)
Admission: EM | Disposition: A | Discharge status: Home / Self Care | Payer: Self-pay | Source: Home / Self Care | Attending: Cardiology

## 2017-10-31 ENCOUNTER — Encounter (HOSPITAL_COMMUNITY): Payer: Self-pay | Admitting: Cardiovascular Disease

## 2017-10-31 DIAGNOSIS — Z8249 Family history of ischemic heart disease and other diseases of the circulatory system: Secondary | ICD-10-CM

## 2017-10-31 DIAGNOSIS — R079 Chest pain, unspecified: Secondary | ICD-10-CM

## 2017-10-31 DIAGNOSIS — I493 Ventricular premature depolarization: Secondary | ICD-10-CM | POA: Diagnosis not present

## 2017-10-31 DIAGNOSIS — R9431 Abnormal electrocardiogram [ECG] [EKG]: Secondary | ICD-10-CM

## 2017-10-31 HISTORY — PX: LEFT HEART CATH AND CORONARY ANGIOGRAPHY: CATH118249

## 2017-10-31 LAB — CBC
HCT: 38.4 % — ABNORMAL LOW (ref 39.0–52.0)
Hemoglobin: 13 g/dL (ref 13.0–17.0)
MCH: 30.6 pg (ref 26.0–34.0)
MCHC: 33.9 g/dL (ref 30.0–36.0)
MCV: 90.4 fL (ref 78.0–100.0)
Platelets: 187 10*3/uL (ref 150–400)
RBC: 4.25 MIL/uL (ref 4.22–5.81)
RDW: 12.4 % (ref 11.5–15.5)
WBC: 5.6 10*3/uL (ref 4.0–10.5)

## 2017-10-31 LAB — POCT ACTIVATED CLOTTING TIME: Activated Clotting Time: 114 seconds

## 2017-10-31 LAB — HEPARIN LEVEL (UNFRACTIONATED): Heparin Unfractionated: 0.5 IU/mL (ref 0.30–0.70)

## 2017-10-31 SURGERY — LEFT HEART CATH AND CORONARY ANGIOGRAPHY
Anesthesia: LOCAL

## 2017-10-31 MED ORDER — HEPARIN (PORCINE) IN NACL 2-0.9 UNITS/ML
INTRAMUSCULAR | Status: AC | PRN
  Administered 2017-10-31 (×2): 500 mL via INTRA_ARTERIAL

## 2017-10-31 MED ORDER — ACETAMINOPHEN 325 MG PO TABS: 650.0000 mg | ORAL_TABLET | ORAL | Status: DC | PRN

## 2017-10-31 MED ORDER — HEPARIN (PORCINE) IN NACL 1000-0.9 UT/500ML-% IV SOLN
INTRAVENOUS | Status: AC
  Filled 2017-10-31: qty 1000

## 2017-10-31 MED ORDER — MIDAZOLAM HCL 2 MG/2ML IJ SOLN
INTRAMUSCULAR | Status: DC | PRN
  Administered 2017-10-31 (×3): 1 mg via INTRAVENOUS

## 2017-10-31 MED ORDER — IOHEXOL 350 MG/ML SOLN
INTRAVENOUS | Status: DC | PRN
  Administered 2017-10-31: 50 mL via INTRA_ARTERIAL

## 2017-10-31 MED ORDER — MIDAZOLAM HCL 2 MG/2ML IJ SOLN
INTRAMUSCULAR | Status: AC
  Filled 2017-10-31: qty 2

## 2017-10-31 MED ORDER — POTASSIUM CHLORIDE CRYS ER 20 MEQ PO TBCR
20.0000 meq | EXTENDED_RELEASE_TABLET | Freq: Once | ORAL | Status: AC
  Administered 2017-10-31: 20 meq via ORAL
  Filled 2017-10-31: qty 1

## 2017-10-31 MED ORDER — LIDOCAINE HCL (PF) 1 % IJ SOLN
INTRAMUSCULAR | Status: DC | PRN
  Administered 2017-10-31: 13 mL

## 2017-10-31 MED ORDER — VERAPAMIL HCL 2.5 MG/ML IV SOLN
INTRAVENOUS | Status: AC
  Filled 2017-10-31: qty 2

## 2017-10-31 MED ORDER — DIAZEPAM 5 MG PO TABS
5.0000 mg | ORAL_TABLET | Freq: Four times a day (QID) | ORAL | Status: DC | PRN
Start: 2017-10-31 — End: 2017-10-31

## 2017-10-31 MED ORDER — FENTANYL CITRATE (PF) 100 MCG/2ML IJ SOLN
INTRAMUSCULAR | Status: AC
  Filled 2017-10-31: qty 2

## 2017-10-31 MED ORDER — SODIUM CHLORIDE 0.9 % IV SOLN: INTRAVENOUS | Status: DC

## 2017-10-31 MED ORDER — FENTANYL CITRATE (PF) 100 MCG/2ML IJ SOLN
INTRAMUSCULAR | Status: DC | PRN
  Administered 2017-10-31 (×3): 25 ug via INTRAVENOUS

## 2017-10-31 MED ORDER — SODIUM CHLORIDE 0.9% FLUSH: 3.0000 mL | Freq: Two times a day (BID) | INTRAVENOUS | Status: DC

## 2017-10-31 MED ORDER — SODIUM CHLORIDE 0.9 % IV SOLN: 250.0000 mL | INTRAVENOUS | Status: DC | PRN

## 2017-10-31 MED ORDER — SODIUM CHLORIDE 0.9% FLUSH: 3.0000 mL | INTRAVENOUS | Status: DC | PRN

## 2017-10-31 MED ORDER — ONDANSETRON HCL 4 MG/2ML IJ SOLN: 4.0000 mg | Freq: Four times a day (QID) | INTRAMUSCULAR | Status: DC | PRN

## 2017-10-31 MED ORDER — ACETAMINOPHEN 325 MG PO TABS: 650.0000 mg | ORAL_TABLET | Freq: Four times a day (QID) | ORAL | Status: DC | PRN

## 2017-10-31 SURGICAL SUPPLY — 14 items
CATH INFINITI MULTIPACK ST 5F (CATHETERS) ×2 IMPLANT
DEVICE CLOSURE MYNXGRIP 5F (Vascular Products) ×2 IMPLANT
GUIDEWIRE INQWIRE 1.5J.035X260 (WIRE) ×1 IMPLANT
INQWIRE 1.5J .035X260CM (WIRE) ×2
KIT HEART LEFT (KITS) ×2 IMPLANT
NEEDLE PERC 21GX4CM (NEEDLE) ×2 IMPLANT
PACK CARDIAC CATHETERIZATION (CUSTOM PROCEDURE TRAY) ×2 IMPLANT
SHEATH PINNACLE 5F 10CM (SHEATH) ×2 IMPLANT
SHEATH RAIN RADIAL 21G 6FR (SHEATH) ×2 IMPLANT
SYR MEDRAD MARK V 150ML (SYRINGE) ×2 IMPLANT
TRANSDUCER W/STOPCOCK (MISCELLANEOUS) ×2 IMPLANT
TUBING CIL FLEX 10 FLL-RA (TUBING) ×2 IMPLANT
WIRE EMERALD 3MM-J .035X150CM (WIRE) ×2 IMPLANT
WIRE MICROINTRODUCER 60CM (WIRE) ×4 IMPLANT

## 2017-10-31 NOTE — Interval H&P Note (Signed)
Cath Lab Visit (complete for each Cath Lab visit)  Clinical Evaluation Leading to the Procedure:   ACS: No.  Non-ACS:    Anginal Classification: CCS III  Anti-ischemic medical therapy: Minimal Therapy (1 class of medications)  Non-Invasive Test Results: No non-invasive testing performed  Prior CABG: No previous CABG      History and Physical Interval Note:  10/31/2017 9:44 AM  [name]  has presented today for surgery, with the diagnosis of unstable angina  The various methods of treatment have been discussed with the patient and family. After consideration of risks, benefits and other options for treatment, the patient has consented to  Procedure(s): LEFT HEART CATH AND CORONARY ANGIOGRAPHY (N/A) as a surgical intervention .  The patient's history has been reviewed, patient examined, no change in status, stable for surgery.  I have reviewed the patient's chart and labs.  Questions were answered to the patient's satisfaction.     Shelva Majestic

## 2017-10-31 NOTE — Progress Notes (Signed)
[place] for Heparin Indication: chest pain/ACS  Allergies  Allergen Reactions  . Ciprofloxacin Hcl Other (See Comments)    Burning in ear when drops were used  . Aspirin Other (See Comments)    Upset stomach  Upset stomach   . Gabapentin Other (See Comments)    Weakness and dizziness with higher doses. Weakness and dizziness with higher doses. Weakness and dizziness  . Wellbutrin [Bupropion] Other (See Comments)    Altered mental status   Patient Measurements: Height: 5\' 11"  (180.3 cm) Weight: 151 lb 4.8 oz (68.6 kg) IBW/kg (Calculated) : 75.3 Heparin Dosing Weight: 68.7 kg  Vital Signs: Temp: 97.8 F (36.6 C) (04/29 0406) Temp Source: Oral (04/29 0406) BP: 111/70 (04/29 0536) Pulse Rate: 65 (04/29 0406)  Labs: Recent Labs    10/28/17 1115 10/28/17 2106  10/29/17 0352 10/29/17 0925 10/29/17 1114 10/30/17 0333 10/31/17 0401  HGB 13.9  --   --  12.6*  --   --  12.7* 13.0  HCT 40.8  --   --  37.5*  --   --  37.0* 38.4*  PLT 190  --   --  191  --   --  182 187  LABPROT  --   --   --  13.7  --   --   --   --   INR  --   --   --  1.06  --   --   --   --   HEPARINUNFRC  --   --    < > 0.28*  --  0.32 0.39 0.50  CREATININE 0.89  --   --  0.85  --   --   --   --   TROPONINI  --  <0.03  --  <0.03 <0.03  --   --   --    < > = values in this interval not displayed.    Assessment: 67 yo male presented to ED on 4/26 with chest pain and SOB. Chest pain is consistent with unstable angina. ECG showed T-wave changes.  Currently using clopidogrel instead of aspirin due to hx of GI discomfort with prior aspirin use. CBC wnl. Pt is therapeutic on heparin infusion this morning.   Goal of Therapy:  Heparin level 0.3-0.7 units/ml Monitor platelets by anticoagulation protocol: Yes   Plan:  Continue heparin infusion at 950 units/hour Daily heparin level, CBC F/u after cath    Hughes Better, PharmD, BCPS Clinical  Pharmacist 10/31/2017 8:54 AM

## 2017-10-31 NOTE — Progress Notes (Signed)
Event note: Approximately midnight patient complained of chest pain at 6/10- BP 140/99. Was given 1 SL NTG. SBP 102- EKG performed t-wave changes noted. MD on call paged an notified. At present patient is sleeping soundly- will continue to monitor.

## 2017-10-31 NOTE — Progress Notes (Addendum)
Progress Note  Patient Name: [name] Date of Encounter: 10/31/2017  Primary Cardiologist: Mertie Moores, MD   Subjective   Pt had chest pain overnight- relieved with SL NTG. He describes mid sternal "burning"  Inpatient Medications    Scheduled Meds: . acidophilus  2 capsule Oral Daily  . clopidogrel  75 mg Oral Daily  . famotidine  20 mg Oral QHS  . FLUoxetine  40 mg Oral Daily  . gabapentin  100 mg Oral BID  . loratadine  10 mg Oral Daily  . methylphenidate  10 mg Oral TID  . multivitamin with minerals  1 tablet Oral Daily  . nitroGLYCERIN  0.5 inch Topical Q6H  . pantoprazole  40 mg Oral BID AC  . potassium chloride  20 mEq Oral Once  . simvastatin  20 mg Oral QHS  . tamsulosin  0.4 mg Oral Q lunch   Continuous Infusions: . sodium chloride    . heparin 950 Units/hr (10/31/17 2130)   PRN Meds: acetaminophen, ALPRAZolam, nitroGLYCERIN, ondansetron (ZOFRAN) IV, polyethylene glycol, zolpidem   Vital Signs    Vitals:   10/31/17 0017 10/31/17 0404 10/31/17 0406 10/31/17 0536  BP: 98/75  105/69 111/70  Pulse:   65   Resp:   20   Temp:   97.8 F (36.6 C)   TempSrc:   Oral   SpO2:   100%   Weight:  151 lb 4.8 oz (68.6 kg)    Height:        Intake/Output Summary (Last 24 hours) at 10/31/2017 0806 Last data filed at 10/31/2017 8657 Gross per 24 hour  Intake 1208.27 ml  Output 300 ml  Net 908.27 ml   Filed Weights   10/29/17 0006 10/30/17 0359 10/31/17 0404  Weight: 151 lb 11.2 oz (68.8 kg) 151 lb 6.4 oz (68.7 kg) 151 lb 4.8 oz (68.6 kg)    Telemetry    NSR, PVCs, bigeminy, couplets - Personally Reviewed  ECG    EKG from midnight- NSR- no acute changes- Personally Reviewed  Physical Exam   GEN: No acute distress.   Neck: No JVD Cardiac: RRR, no murmurs, rubs, or gallops.  Respiratory: faint crackles- clear with deep inspiration GI: Soft, nontender, non-distended  MS: No edema; No deformity. Neuro:  Nonfocal  Psych: Normal affect   Labs      Chemistry Recent Labs  Lab 10/28/17 1115 10/29/17 0352  NA 137 140  K 4.0 3.6  CL 102 105  CO2 27 27  GLUCOSE 107* 83  BUN 16 14  CREATININE 0.89 0.85  CALCIUM 8.8* 8.6*  PROT  --  5.5*  ALBUMIN  --  3.2*  AST  --  17  ALT  --  15*  ALKPHOS  --  52  BILITOT  --  0.8  GFRNONAA >60 >60  GFRAA >60 >60  ANIONGAP 8 8     Hematology Recent Labs  Lab 10/29/17 0352 10/30/17 0333 10/31/17 0401  WBC 5.0 5.4 5.6  RBC 4.15* 4.10* 4.25  HGB 12.6* 12.7* 13.0  HCT 37.5* 37.0* 38.4*  MCV 90.4 90.2 90.4  MCH 30.4 31.0 30.6  MCHC 33.6 34.3 33.9  RDW 12.2 12.2 12.4  PLT 191 182 187    Cardiac Enzymes Recent Labs  Lab 10/28/17 2106 10/29/17 0352 10/29/17 0925  TROPONINI <0.03 <0.03 <0.03    Recent Labs  Lab 10/28/17 1218 10/28/17 1755  TROPIPOC 0.00 0.00     BNPNo results for input(s): BNP, PROBNP in the  last 168 hours.   DDimer No results for input(s): DDIMER in the last 168 hours.   Radiology     2V CXR 10/28/17- CHEST - 2 VIEW  COMPARISON:  05/10/2016.  CT 03/15/2016.  FINDINGS: Mediastinum hilar structures are normal. Bilateral pleural-parenchymal thickening noted consistent with scarring. Heart size normal. Degenerative change thoracic spine.  IMPRESSION: 1. Stable bilateral pleural-parenchymal thickening consistent scarring.  2.  No acute pulmonary disease.   Cardiac Studies   Echo 10/29/17- Study Conclusions  - Left ventricle: The cavity size was normal. Systolic function was   normal. The estimated ejection fraction was in the range of 60%   to 65%. Wall motion was normal; there were no regional wall   motion abnormalities.   Patient Profile     67 y.o. male with a FM Hx of CAD, and barrett's esophagus with recent negative GI evaluation, evaluated by Dr Acie Fredrickson for chest pain as an OP . Plans were for coronary CTA but the patient presented to the ED 10/28/17 with progressing chest pain and had dynamic TWI on EKG, Troponin  negative.   Assessment & Plan    Chest pain with a high risk for ACS  FM Hx of CAD  Barrett's esophagus- recent negative endoscopy  COPD- scarring on CXR, quit smoking 1989  PVC's- K+ 3.6 4/27  ADD- on medication  Plan: Cath today. Would add beta blocker but HR runs low-50-60. 20 of KCL this am with ectopy overnight.   For questions or updates, please contact [place] Please consult [email or web address] for contact info under Cardiology/STEMI.     Angelena Form, PA-C  10/31/2017, 8:06 AM    Patient seen, examined. Available data reviewed. Agree with findings, assessment, and plan as outlined by Kerin Ransom, PA-C. On my exam: Vitals:   10/31/17 1120 10/31/17 1135  BP: 101/68 107/69  Pulse: 61 (!) 54  Resp:    Temp:    SpO2: 97% 97%   Pt is alert and oriented, NAD HEENT: normal Neck: JVP - normal Lungs: CTA bilaterally CV: RRR without murmur or gallop Abd: soft, NT, Positive BS, no hepatomegaly Ext: no C/C/E, distal pulses intact and equal. Right wrist and right groin sites clear Skin: warm/dry no rash  Cath data reviewed: pt with mild nonobstructive CAD and normal LVEDP, normal systolic function by ventriculography. Echo also with normal findings, troponin negative. Pain was NTG-responsive. Stable for discharge home, NTG can be used prn, continue current medical therapy. I don't see any indication for ASA or plavix - will discontinue.   Sherren Mocha, M.D. 10/31/2017 11:52 AM

## 2017-10-31 NOTE — Research (Signed)
CADFEM Informed Consent   Subject Name: [name]  Subject met inclusion and exclusion criteria.  The informed consent form, study requirements and expectations were reviewed with the subject and questions and concerns were addressed prior to the signing of the consent form.  The subject verbalized understanding of the trail requirements.  The subject agreed to participate in the CADFEM trial and signed the informed consent.  The informed consent was obtained prior to performance of any protocol-specific procedures for the subject.  A copy of the signed informed consent was given to the subject and a copy was placed in the subject's medical record.  Christena Flake 10/31/2017, 07:24 AM

## 2017-10-31 NOTE — Discharge Summary (Signed)
Discharge Summary    Patient ID: [name],  MRN: [id number], DOB/AGE: 08-06-1950 67 y.o.  Admit date: 10/28/2017 Discharge date: 10/31/2017  Primary Care Provider: Jonathon Jordan Primary Cardiologist: Mertie Moores, MD  Discharge Diagnoses    Principal Problem:   Chest pain with moderate risk of acute coronary syndrome Active Problems:   G E R D   Barrett's esophagus   Chronic respiratory failure with hypoxia (HCC)   Abnormal EKG-dynamic TWI   PVC's (premature ventricular contractions)   Family history of coronary artery disease in father   Allergies Allergies  Allergen Reactions  . Ciprofloxacin Hcl Other (See Comments)    Burning in ear when drops were used  . Aspirin Other (See Comments)    Upset stomach  Upset stomach   . Gabapentin Other (See Comments)    Weakness and dizziness with higher doses. Weakness and dizziness with higher doses. Weakness and dizziness  . Wellbutrin [Bupropion] Other (See Comments)    Altered mental status    Diagnostic Studies/Procedures    Coronary angiogram 10/31/17 Echo cardiogram 10/29/17 _____________   History of Present Illness     67 y/o male admitted with chest pain.  Hospital Course     67 y.o. male with a FM Hx of CAD, and barrett's esophagus with recent negative GIevaluation, evaluated by Dr Acie Fredrickson for chest pain as an OP . Plans were for coronary CTAbut the patient presented to the ED 10/28/17 with progressing chest pain and had dynamic TWI on EKG, Troponin negative. cath done 10/31/17 showed mild CAD with scattered 20% narrowing. Plan is for medical Rx.  _____________  Discharge Vitals Blood pressure 97/62, pulse (!) 53, temperature 97.8 F (36.6 C), temperature source Oral, resp. rate (!) 9, height 5\' 11"  (1.803 m), weight 151 lb 4.8 oz (68.6 kg), SpO2 97 %.  Filed Weights   10/29/17 0006 10/30/17 0359 10/31/17 0404  Weight: 151 lb 11.2 oz (68.8 kg) 151 lb 6.4 oz (68.7 kg) 151 lb 4.8 oz (68.6 kg)     Labs & Radiologic Studies    CBC Recent Labs    10/30/17 0333 10/31/17 0401  WBC 5.4 5.6  HGB 12.7* 13.0  HCT 37.0* 38.4*  MCV 90.2 90.4  PLT 182 016   Basic Metabolic Panel Recent Labs    10/29/17 0352  NA 140  K 3.6  CL 105  CO2 27  GLUCOSE 83  BUN 14  CREATININE 0.85  CALCIUM 8.6*   Liver Function Tests Recent Labs    10/29/17 0352  AST 17  ALT 15*  ALKPHOS 52  BILITOT 0.8  PROT 5.5*  ALBUMIN 3.2*   No results for input(s): LIPASE, AMYLASE in the last 72 hours. Cardiac Enzymes Recent Labs    10/28/17 2106 10/29/17 0352 10/29/17 0925  TROPONINI <0.03 <0.03 <0.03   BNP Invalid input(s): POCBNP D-Dimer No results for input(s): DDIMER in the last 72 hours. Hemoglobin A1C Recent Labs    10/28/17 2106  HGBA1C 5.4   Fasting Lipid Panel Recent Labs    10/29/17 0352  CHOL 128  HDL 38*  LDLCALC 76  TRIG 69  CHOLHDL 3.4   Thyroid Function Tests Recent Labs    10/28/17 2106  TSH 4.474   _____________  Dg Chest 2 View  Result Date: 10/28/2017 CLINICAL DATA:  Chest pain with radiation right arm. EXAM: CHEST - 2 VIEW COMPARISON:  05/10/2016.  CT 03/15/2016. FINDINGS: Mediastinum hilar structures are normal. Bilateral pleural-parenchymal thickening noted consistent  with scarring. Heart size normal. Degenerative change thoracic spine. IMPRESSION: 1. Stable bilateral pleural-parenchymal thickening consistent scarring. 2.  No acute pulmonary disease. Electronically Signed   By: Marcello Moores  Register   On: 10/28/2017 14:54   Disposition   Pt is being discharged home today in good condition.  Follow-up Plans & Appointments    Follow-up Information    Nahser, Wonda Cheng, MD Follow up.   Specialty:  Cardiology Why:  office will conatct you Contact information: [address] [address] [phone]            Discharge Medications   Allergies as of 10/31/2017      Reactions   Ciprofloxacin Hcl Other (See  Comments)   Burning in ear when drops were used   Aspirin Other (See Comments)   Upset stomach  Upset stomach    Gabapentin Other (See Comments)   Weakness and dizziness with higher doses. Weakness and dizziness with higher doses. Weakness and dizziness   Wellbutrin [bupropion] Other (See Comments)   Altered mental status      Medication List    STOP taking these medications   metoprolol tartrate 50 MG tablet Commonly known as:  LOPRESSOR   nitroGLYCERIN 0.4 MG SL tablet Commonly known as:  NITROSTAT     TAKE these medications   acetaminophen 325 MG tablet Commonly known as:  TYLENOL Take 2 tablets (650 mg total) by mouth every 6 (six) hours as needed for mild pain or headache.   cetirizine 10 MG tablet Commonly known as:  ZYRTEC Take 10 mg by mouth at bedtime.   famotidine 20 MG tablet Commonly known as:  PEPCID Take 1 tablet (20 mg total) by mouth at bedtime.   FLUoxetine 40 MG capsule Commonly known as:  PROZAC Take 40 mg by mouth daily.   gabapentin 100 MG capsule Commonly known as:  NEURONTIN TAKE 1 CAPSULE BY MOUTH TWICE DAILY   methylphenidate 10 MG tablet Commonly known as:  RITALIN Take 10 mg by mouth 3 (three) times daily.   multivitamin with minerals Tabs tablet Take 1 tablet by mouth daily.   omeprazole 20 MG capsule Commonly known as:  PRILOSEC Take 40 mg by mouth 2 (two) times daily before a meal.   polyethylene glycol packet Commonly known as:  MIRALAX / GLYCOLAX Take 17 g by mouth daily as needed (for constipation.).   PROBIOTIC DAILY Caps Take 2 capsules by mouth daily. Reported on 08/21/2015   simvastatin 20 MG tablet Commonly known as:  ZOCOR Take 20 mg by mouth at bedtime.   tamsulosin 0.4 MG Caps capsule Commonly known as:  FLOMAX Take 0.4 mg by mouth daily with lunch.         Outstanding Labs/Studies    Duration of Discharge Encounter   Greater than 30 minutes including physician time.  Signed, [id number] [place] 10/31/2017, 3:08 PM

## 2017-11-01 MED FILL — Heparin Sod (Porcine)-NaCl IV Soln 1000 Unit/500ML-0.9%: INTRAVENOUS | Qty: 1000 | Status: AC

## 2017-11-01 MED FILL — Verapamil HCl IV Soln 2.5 MG/ML: INTRAVENOUS | Qty: 2 | Status: AC

## 2017-11-02 NOTE — Consult Note (Signed)
            [facility] CM Primary Care Navigator  11/02/2017  [name] 1950-12-20 [id number]  Attempt toseepatient at the bedside to identify possible discharge needsbuthe was already dischargedhome perstaffreport.  Per MD note, patient was seen and treated for chest pain with moderate risk of acute coronary syndrome, underwent cardiac catheterization and coronary angiography.  Primary care provider's officeis listed as providingtransition of care (TOC)follow-up.   Patient has discharge instruction to follow-up withcardiology post hospitalization.   For additional questions please contact:  Edwena Felty A. Dariana Garbett, BSN, RN-BC [facility] PRIMARY CARE Navigator Cell: [phone]

## 2017-11-09 ENCOUNTER — Encounter: Payer: Self-pay | Admitting: Cardiovascular Disease

## 2017-11-11 ENCOUNTER — Telehealth: Payer: Self-pay | Admitting: Cardiovascular Disease

## 2017-11-11 NOTE — Telephone Encounter (Signed)
New message    Patient calling with concerns about arm pain, patient had heart cath last week

## 2017-11-11 NOTE — Telephone Encounter (Signed)
Patient calling and states that he is having some pain in his right arm. Patient had LHC on 4/29. Radial approach was attempted several times unsuccessfully and was converted to femoral approach. Patient states that the pain in his right arm a little above the insertion site. Patient is wondering if this is okay. Patient states that his bruising has gotten better. Patient denies any swelling, color or temperature changes in the extremity, numbness or tingling in the extremity, redness, bleeding, or inflammation at the insertion site. Patient denies any fever. Patient denies having any other complaints at this time. Instructed for patient to take Tylenol for his pain. Moved patient's f/u appointment with Dr. Acie Fredrickson to 5/14 at 8:20 AM. Instructed for patient to let us know if his pain significantly worsens or if he develops any additional symptoms. Patient verbalized understanding and was appreciative for the call.

## 2017-11-12 NOTE — Telephone Encounter (Signed)
Agree with note by Drue Novel. Will assess his cath site at office visit

## 2017-11-15 ENCOUNTER — Ambulatory Visit (INDEPENDENT_AMBULATORY_CARE_PROVIDER_SITE_OTHER): Payer: Medicare Other | Admitting: Cardiovascular Disease

## 2017-11-15 ENCOUNTER — Encounter: Payer: Self-pay | Admitting: Cardiovascular Disease

## 2017-11-15 VITALS — BP 88/46 | HR 71 | Ht 72.0 in | Wt 156.8 lb

## 2017-11-15 DIAGNOSIS — I209 Angina pectoris, unspecified: Secondary | ICD-10-CM

## 2017-11-15 DIAGNOSIS — I2510 Atherosclerotic heart disease of native coronary artery without angina pectoris: Secondary | ICD-10-CM | POA: Diagnosis not present

## 2017-11-15 DIAGNOSIS — R0789 Other chest pain: Secondary | ICD-10-CM | POA: Diagnosis not present

## 2017-11-15 MED ORDER — ROSUVASTATIN CALCIUM 10 MG PO TABS: 10.0000 mg | ORAL_TABLET | Freq: Every day | ORAL | 3 refills | Status: DC

## 2017-11-15 NOTE — Progress Notes (Signed)
Cardiology Office Note   Date:  11/15/2017   ID:  HAVOC SANLUIS, DOB [birth year], MRN [id number]  PCP:  Jonathon Jordan, MD  Cardiologist:   Mertie Moores, MD   Chief Complaint  Patient presents with  . Chest Pain  . Hyperlipidemia   Problem List 1. Chest pain 2. Hiatal hernia 3. GERD 4. Barretts esophagitis      [name] is a 67 y.o. male who presents for evaluation of his CP Would wake him up at night.  Not associated with eating or drinking. Various times of the day  Does not exercise so he does not know if it is exacerbated with exertion .  Would become very fatigued with household chores but not cause chest pain  for the past year.  Gets very tired vaccuming 1/2 of the appt.  Walks the dog occasionally . Used to walk 1 1/2 miles a day - stopped walking this in may , 2015  Pains will ast 25-45 minutes.  sulcrafate has helped the pains quite a bit.    October 27, 2017:  Donoven is seen today for urgent work in for episodes of chest discomfort.  Is been seeing Dr. Oletta Lamas for a GI evaluation and evaluation is been relatively negative.  Has been having lots of chest pain .   The thought it was GERD For the past week or so , has moderate CP  3-4 / 10  Last 15- 20 minutes Associated with nausea  Does not exercise much but pain does not seem to be exertional . In 2017, he had flu shot and pneumonia shot.  Had a pneumonia like illness folowing that , lost 100 lbs  Was not able to walk  Has recovered well.   Still is slightly weak.   Does not have much stamina   I saw him in 2016,    GXT was normal  Echocardiogram in September, 2017 reveals normal left ventricular systolic function.  Ejection fraction is 50 to 55%.  Nov 15, 2017:  Whyatt is seen for follow-up.  He has a family history of coronary artery disease and has hyperlipidemia. Eryx called after his last visit was complaining of more chest discomfort.  We schedule him for a heart catheterization.    Heart cath revealed mild coronary artery disease involving the LAD, circumflex vessel and right coronary artery.  The cath site was initially attempted from the right radial artery and was later transitioned to the right femoral artery.  He still has some right arm pain and tenderness.  No further episodes of CP.  No real exercise.  Still has episodes of chest "presence" that then worsens to become a burning  Has had an ECG - no esophageal issues  Is not sleeping well    Past Medical History:  Diagnosis Date  . Arthritis   . Bronchitis    history of  . 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 GERD (gastroesophageal reflux disease)   . Hiatal hernia   . 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 Hypercalcemia   . Lipoma    left forearm (3)  . Mitral valve prolapse    does not see cardiologist for. last stress test 2003  . Pneumonia 2009  . Tumor cells, benign    lung, left side  . Unstable angina ([place]) 10/28/2017    Past Surgical History:  Procedure Laterality Date  . APPENDECTOMY  1984  . CATARACT EXTRACTION  L and R eye  . CHOLECYSTECTOMY  2002  . HYDROCELE EXCISION Right 11/04/2016   Procedure: HYDROCELECTOMY ADULT;  Surgeon: Franchot Gallo, MD;  Location: [facility];  Service: Urology;  Laterality: Right;  . INGUINAL HERNIA REPAIR Bilateral 11/04/2016   Procedure: HERNIA REPAIR INGUINAL ADULT BILATERAL;  Surgeon: Jackolyn Confer, MD;  Location: [facility];  Service: General;  Laterality: Bilateral;  . INSERTION OF MESH Bilateral 11/04/2016   Procedure: INSERTION OF MESH;  Surgeon: Jackolyn Confer, MD;  Location: [facility];  Service: General;  Laterality: Bilateral;  . JOINT REPLACEMENT     Lt hip  . LEFT HEART CATH AND CORONARY ANGIOGRAPHY N/A 10/31/2017   Procedure: LEFT HEART CATH AND CORONARY ANGIOGRAPHY;  Surgeon: Troy Sine, MD;  Location: [facility];  Service: Cardiovascular;  Laterality: N/A;  . MASTOIDECTOMY  1996  . NASAL SEPTUM SURGERY     sinus surgery  .  PARATHYROIDECTOMY    . TOTAL HIP REVISION  06/14/2011   Procedure: TOTAL HIP REVISION;  Surgeon: Kerin Salen;  Location: [facility];  Service: Orthopedics;  Laterality: Left;  Left Acetabular  Hip Revision     Current Outpatient Medications  Medication Sig Dispense Refill  . acetaminophen (TYLENOL) 325 MG tablet Take 2 tablets (650 mg total) by mouth every 6 (six) hours as needed for mild pain or headache.    . cetirizine (ZYRTEC) 10 MG tablet Take 10 mg by mouth at bedtime.    . famotidine (PEPCID) 20 MG tablet Take 1 tablet (20 mg total) by mouth at bedtime. 30 tablet 3  . FLUoxetine (PROZAC) 40 MG capsule Take 40 mg by mouth daily.     Marland Kitchen gabapentin (NEURONTIN) 100 MG capsule TAKE 1 CAPSULE BY MOUTH TWICE DAILY 60 capsule 0  . methylphenidate (RITALIN) 10 MG tablet Take 10 mg by mouth 3 (three) times daily.      . Multiple Vitamin (MULTIVITAMIN WITH MINERALS) TABS tablet Take 1 tablet by mouth daily.    Marland Kitchen omeprazole (PRILOSEC) 20 MG capsule Take 40 mg by mouth 2 (two) times daily before a meal.     . polyethylene glycol (MIRALAX / GLYCOLAX) packet Take 17 g by mouth daily as needed (for constipation.).     Marland Kitchen Probiotic Product (PROBIOTIC DAILY) CAPS Take 2 capsules by mouth daily. Reported on 08/21/2015    . simvastatin (ZOCOR) 20 MG tablet Take 20 mg by mouth at bedtime.      . tamsulosin (FLOMAX) 0.4 MG CAPS capsule Take 0.4 mg by mouth daily with lunch.      No current facility-administered medications for this visit.     Allergies:   Ciprofloxacin hcl; Aspirin; Gabapentin; and Wellbutrin [bupropion]    Social History:  The patient  reports that he quit smoking about 30 years ago. His smoking use included cigarettes. He has a 36.00 pack-year smoking history. He has quit using smokeless tobacco. He reports that he drinks about 0.6 oz of alcohol per week. He reports that he does not use drugs.   Family History:  The patient's family history includes Cancer in his brother; Heart Problems in  his father and mother; Lung cancer (age of onset: 41) in his father.    ROS:   Noted in current history, otherwise review of systems is negative.  Physical Exam: Blood pressure (!) 88/46, pulse 71, height 6' (1.829 m), weight 156 lb 12.8 oz (71.1 kg), SpO2 99 %.  GEN:  Well nourished, well developed in no acute distress  HEENT: Normal NECK: No JVD; No carotid bruits LYMPHATICS: No lymphadenopathy CARDIAC: RRR   RESPIRATORY:  Clear to auscultation without rales, wheezing or rhonchi  ABDOMEN: Soft, non-tender, non-distended MUSCULOSKELETAL:  No edema; No deformity  SKIN: Warm and dry NEUROLOGIC:  Alert and oriented x 3   EKG:   Recent Labs: 10/28/2017: TSH 4.474 10/29/2017: ALT 15; BUN 14; Creatinine, Ser 0.85; Potassium 3.6; Sodium 140 10/31/2017: Hemoglobin 13.0; Platelets 187    Lipid Panel    Component Value Date/Time   CHOL 128 10/29/2017 0352   TRIG 69 10/29/2017 0352   HDL 38 (L) 10/29/2017 0352   CHOLHDL 3.4 10/29/2017 0352   VLDL 14 10/29/2017 0352   LDLCALC 76 10/29/2017 0352      Wt Readings from Last 3 Encounters:  11/15/17 156 lb 12.8 oz (71.1 kg)  10/31/17 151 lb 4.8 oz (68.6 kg)  10/27/17 155 lb (70.3 kg)      Other studies Reviewed: Additional studies/ records that were reviewed today include: . Review of the above records demonstrates:    ASSESSMENT AND PLAN:  1.  Chest pain  : He had a heart catheterization for symptoms of angina.  He was found to have mild coronary artery disease involving LAD, circumflex, right coronary artery.  I do not think that his episodes of chest pain or related to unstable angina.   2.  Hyperlipidemia: This previously had been managed by his primary medical doctor.  We will try to be a bit more aggressive in lowering his LDL since he does have mild coronary artery disease.  We will change him to Crestor 10 mg a day.  We will check fasting lipids, liver enzymes, basic metabolic profile in 3 months.   Current medicines  are reviewed at length with the patient today.  The patient does not have concerns regarding medicines.  The following changes have been made:  no change  Labs/ tests ordered today include:  No orders of the defined types were placed in this encounter.    Disposition:   We will check lipids again in 3 months.  I will see him in 6 months.    Mertie Moores, MD  11/15/2017 9:09 AM    [facility] [place], [place], [place]  [address] Phone: [phone]; Fax: [phone]

## 2017-11-15 NOTE — Patient Instructions (Signed)
Medication Instructions:  Your physician has recommended you make the following change in your medication:   STOP Simvastatin  START Rosuvastatin (Crestor) 10 mg once daily   Labwork: Your physician recommends that you return for lab work in: 3 months  You will need to FAST for this appointment - nothing to eat or drink after midnight the night before except water.   Testing/Procedures: None Ordered   Follow-Up: Your physician wants you to follow-up in: 6 months with Dr. Acie Fredrickson. You will receive a reminder letter in the mail two months in advance. If you don't receive a letter, please call our office to schedule the follow-up appointment.   If you need a refill on your cardiac medications before your next appointment, please call your pharmacy.   Thank you for choosing [facility]! Christen Bame, RN [phone]

## 2017-11-18 ENCOUNTER — Ambulatory Visit: Payer: Medicare Other | Admitting: Cardiovascular Disease

## 2017-12-07 ENCOUNTER — Ambulatory Visit (HOSPITAL_COMMUNITY): Payer: Medicare Other

## 2018-01-17 ENCOUNTER — Ambulatory Visit: Payer: Medicare Other | Admitting: Cardiovascular Disease

## 2018-01-30 DIAGNOSIS — H26491 Other secondary cataract, right eye: Secondary | ICD-10-CM | POA: Diagnosis not present

## 2018-01-30 DIAGNOSIS — H43813 Vitreous degeneration, bilateral: Secondary | ICD-10-CM | POA: Diagnosis not present

## 2018-01-30 DIAGNOSIS — Z961 Presence of intraocular lens: Secondary | ICD-10-CM | POA: Diagnosis not present

## 2018-01-31 DIAGNOSIS — H7293 Unspecified perforation of tympanic membrane, bilateral: Secondary | ICD-10-CM | POA: Diagnosis not present

## 2018-01-31 DIAGNOSIS — H6123 Impacted cerumen, bilateral: Secondary | ICD-10-CM | POA: Insufficient documentation

## 2018-01-31 DIAGNOSIS — H60392 Other infective otitis externa, left ear: Secondary | ICD-10-CM | POA: Diagnosis not present

## 2018-02-13 ENCOUNTER — Other Ambulatory Visit: Payer: Medicare Other | Admitting: *Deleted

## 2018-02-13 DIAGNOSIS — R0789 Other chest pain: Secondary | ICD-10-CM | POA: Diagnosis not present

## 2018-02-13 DIAGNOSIS — I2510 Atherosclerotic heart disease of native coronary artery without angina pectoris: Secondary | ICD-10-CM | POA: Diagnosis not present

## 2018-02-13 LAB — HEPATIC FUNCTION PANEL
ALT: 23 IU/L (ref 0–44)
AST: 23 IU/L (ref 0–40)
Albumin: 4.1 g/dL (ref 3.6–4.8)
Alkaline Phosphatase: 69 IU/L (ref 39–117)
BILIRUBIN, DIRECT: 0.14 mg/dL (ref 0.00–0.40)
Bilirubin Total: 0.5 mg/dL (ref 0.0–1.2)
TOTAL PROTEIN: 6.4 g/dL (ref 6.0–8.5)

## 2018-02-13 LAB — LIPID PANEL
CHOL/HDL RATIO: 3.3 ratio (ref 0.0–5.0)
Cholesterol, Total: 134 mg/dL (ref 100–199)
HDL: 41 mg/dL (ref >39)
LDL Calculated: 66 mg/dL (ref 0–99)
Triglycerides: 137 mg/dL (ref 0–149)
VLDL Cholesterol Cal: 27 mg/dL (ref 5–40)

## 2018-02-13 LAB — BASIC METABOLIC PANEL
BUN/Creatinine Ratio: 15 (ref 10–24)
BUN: 16 mg/dL (ref 8–27)
CHLORIDE: 103 mmol/L (ref 96–106)
CO2: 26 mmol/L (ref 20–29)
Calcium: 8.9 mg/dL (ref 8.6–10.2)
Creatinine, Ser: 1.08 mg/dL (ref 0.76–1.27)
GFR calc Af Amer: 82 mL/min/{1.73_m2} (ref >59)
GFR calc non Af Amer: 71 mL/min/{1.73_m2} (ref >59)
GLUCOSE: 83 mg/dL (ref 65–99)
POTASSIUM: 4.4 mmol/L (ref 3.5–5.2)
Sodium: 142 mmol/L (ref 134–144)

## 2018-03-16 DIAGNOSIS — D1801 Hemangioma of skin and subcutaneous tissue: Secondary | ICD-10-CM | POA: Diagnosis not present

## 2018-03-16 DIAGNOSIS — L218 Other seborrheic dermatitis: Secondary | ICD-10-CM | POA: Diagnosis not present

## 2018-03-16 DIAGNOSIS — L821 Other seborrheic keratosis: Secondary | ICD-10-CM | POA: Diagnosis not present

## 2018-04-28 DIAGNOSIS — Z23 Encounter for immunization: Secondary | ICD-10-CM | POA: Diagnosis not present

## 2018-05-15 DIAGNOSIS — F330 Major depressive disorder, recurrent, mild: Secondary | ICD-10-CM | POA: Diagnosis not present

## 2018-05-15 DIAGNOSIS — D692 Other nonthrombocytopenic purpura: Secondary | ICD-10-CM | POA: Diagnosis not present

## 2018-05-15 DIAGNOSIS — Z79899 Other long term (current) drug therapy: Secondary | ICD-10-CM | POA: Diagnosis not present

## 2018-05-15 DIAGNOSIS — I25119 Atherosclerotic heart disease of native coronary artery with unspecified angina pectoris: Secondary | ICD-10-CM | POA: Diagnosis not present

## 2018-05-15 DIAGNOSIS — Z0000 Encounter for general adult medical examination without abnormal findings: Secondary | ICD-10-CM | POA: Diagnosis not present

## 2018-05-15 DIAGNOSIS — Z1159 Encounter for screening for other viral diseases: Secondary | ICD-10-CM | POA: Diagnosis not present

## 2018-05-15 DIAGNOSIS — F988 Other specified behavioral and emotional disorders with onset usually occurring in childhood and adolescence: Secondary | ICD-10-CM | POA: Diagnosis not present

## 2018-05-15 DIAGNOSIS — E441 Mild protein-calorie malnutrition: Secondary | ICD-10-CM | POA: Diagnosis not present

## 2018-05-15 DIAGNOSIS — Z125 Encounter for screening for malignant neoplasm of prostate: Secondary | ICD-10-CM | POA: Diagnosis not present

## 2018-05-15 DIAGNOSIS — E785 Hyperlipidemia, unspecified: Secondary | ICD-10-CM | POA: Diagnosis not present

## 2018-06-19 DIAGNOSIS — L820 Inflamed seborrheic keratosis: Secondary | ICD-10-CM | POA: Diagnosis not present

## 2018-06-19 DIAGNOSIS — L989 Disorder of the skin and subcutaneous tissue, unspecified: Secondary | ICD-10-CM | POA: Diagnosis not present

## 2018-06-29 DIAGNOSIS — R05 Cough: Secondary | ICD-10-CM | POA: Diagnosis not present

## 2018-07-12 DIAGNOSIS — R413 Other amnesia: Secondary | ICD-10-CM | POA: Diagnosis not present

## 2018-07-18 DIAGNOSIS — M25552 Pain in left hip: Secondary | ICD-10-CM | POA: Diagnosis not present

## 2018-07-18 DIAGNOSIS — J019 Acute sinusitis, unspecified: Secondary | ICD-10-CM | POA: Diagnosis not present

## 2018-07-24 DIAGNOSIS — R05 Cough: Secondary | ICD-10-CM | POA: Diagnosis not present

## 2018-09-04 DIAGNOSIS — H7293 Unspecified perforation of tympanic membrane, bilateral: Secondary | ICD-10-CM | POA: Diagnosis not present

## 2018-09-04 DIAGNOSIS — H60392 Other infective otitis externa, left ear: Secondary | ICD-10-CM | POA: Diagnosis not present

## 2018-09-04 DIAGNOSIS — H6121 Impacted cerumen, right ear: Secondary | ICD-10-CM | POA: Diagnosis not present

## 2018-11-04 ENCOUNTER — Other Ambulatory Visit: Payer: Self-pay | Admitting: Cardiovascular Disease

## 2018-11-29 DIAGNOSIS — Z03818 Encounter for observation for suspected exposure to other biological agents ruled out: Secondary | ICD-10-CM | POA: Diagnosis not present

## 2018-12-13 DIAGNOSIS — H7293 Unspecified perforation of tympanic membrane, bilateral: Secondary | ICD-10-CM | POA: Diagnosis not present

## 2018-12-13 DIAGNOSIS — H906 Mixed conductive and sensorineural hearing loss, bilateral: Secondary | ICD-10-CM | POA: Diagnosis not present

## 2018-12-25 DIAGNOSIS — F988 Other specified behavioral and emotional disorders with onset usually occurring in childhood and adolescence: Secondary | ICD-10-CM | POA: Diagnosis not present

## 2018-12-25 DIAGNOSIS — G4700 Insomnia, unspecified: Secondary | ICD-10-CM | POA: Diagnosis not present

## 2018-12-29 ENCOUNTER — Other Ambulatory Visit: Payer: Self-pay

## 2018-12-29 ENCOUNTER — Emergency Department (HOSPITAL_COMMUNITY)
Admission: EM | Admit: 2018-12-29 | Discharge: 2018-12-30 | Disposition: A | Discharge status: Home / Self Care | Payer: Medicare Other | Attending: Emergency Medicine | Admitting: Emergency Medicine

## 2018-12-29 ENCOUNTER — Emergency Department (HOSPITAL_COMMUNITY): Payer: Medicare Other

## 2018-12-29 ENCOUNTER — Encounter (HOSPITAL_COMMUNITY): Payer: Self-pay | Admitting: Emergency Medicine

## 2018-12-29 DIAGNOSIS — Z79899 Other long term (current) drug therapy: Secondary | ICD-10-CM | POA: Diagnosis not present

## 2018-12-29 DIAGNOSIS — R413 Other amnesia: Secondary | ICD-10-CM | POA: Diagnosis not present

## 2018-12-29 DIAGNOSIS — R51 Headache: Secondary | ICD-10-CM | POA: Diagnosis not present

## 2018-12-29 DIAGNOSIS — R404 Transient alteration of awareness: Secondary | ICD-10-CM

## 2018-12-29 DIAGNOSIS — E039 Hypothyroidism, unspecified: Secondary | ICD-10-CM | POA: Diagnosis not present

## 2018-12-29 DIAGNOSIS — Z87891 Personal history of nicotine dependence: Secondary | ICD-10-CM | POA: Insufficient documentation

## 2018-12-29 DIAGNOSIS — R410 Disorientation, unspecified: Secondary | ICD-10-CM | POA: Diagnosis not present

## 2018-12-29 DIAGNOSIS — R4182 Altered mental status, unspecified: Secondary | ICD-10-CM | POA: Diagnosis not present

## 2018-12-29 LAB — I-STAT CHEM 8, ED
BUN: 18 mg/dL (ref 8–23)
Calcium, Ion: 1.22 mmol/L (ref 1.15–1.40)
Chloride: 102 mmol/L (ref 98–111)
Creatinine, Ser: 1 mg/dL (ref 0.61–1.24)
Glucose, Bld: 133 mg/dL — ABNORMAL HIGH (ref 70–99)
HCT: 44 % (ref 39.0–52.0)
Hemoglobin: 15 g/dL (ref 13.0–17.0)
Potassium: 4.1 mmol/L (ref 3.5–5.1)
Sodium: 139 mmol/L (ref 135–145)
TCO2: 30 mmol/L (ref 22–32)

## 2018-12-29 LAB — CBG MONITORING, ED: Glucose-Capillary: 152 mg/dL — ABNORMAL HIGH (ref 70–99)

## 2018-12-29 LAB — CBC
HCT: 44.5 % (ref 39.0–52.0)
Hemoglobin: 15 g/dL (ref 13.0–17.0)
MCH: 30.9 pg (ref 26.0–34.0)
MCHC: 33.7 g/dL (ref 30.0–36.0)
MCV: 91.8 fL (ref 80.0–100.0)
Platelets: 173 10*3/uL (ref 150–400)
RBC: 4.85 MIL/uL (ref 4.22–5.81)
RDW: 12 % (ref 11.5–15.5)
WBC: 6.8 10*3/uL (ref 4.0–10.5)
nRBC: 0 % (ref 0.0–0.2)

## 2018-12-29 LAB — URINALYSIS, ROUTINE W REFLEX MICROSCOPIC
Bilirubin Urine: NEGATIVE
Glucose, UA: NEGATIVE mg/dL
Hgb urine dipstick: NEGATIVE
Ketones, ur: NEGATIVE mg/dL
Leukocytes,Ua: NEGATIVE
Nitrite: NEGATIVE
Protein, ur: NEGATIVE mg/dL
Specific Gravity, Urine: 1.023 (ref 1.005–1.030)
pH: 5 (ref 5.0–8.0)

## 2018-12-29 LAB — COMPREHENSIVE METABOLIC PANEL
ALT: 24 U/L (ref 0–44)
AST: 25 U/L (ref 15–41)
Albumin: 3.9 g/dL (ref 3.5–5.0)
Alkaline Phosphatase: 71 U/L (ref 38–126)
Anion gap: 9 (ref 5–15)
BUN: 16 mg/dL (ref 8–23)
CO2: 27 mmol/L (ref 22–32)
Calcium: 9.3 mg/dL (ref 8.9–10.3)
Chloride: 103 mmol/L (ref 98–111)
Creatinine, Ser: 1.12 mg/dL (ref 0.61–1.24)
GFR calc Af Amer: >60 mL/min (ref >60)
GFR calc non Af Amer: >60 mL/min (ref >60)
Glucose, Bld: 139 mg/dL — ABNORMAL HIGH (ref 70–99)
Potassium: 4.2 mmol/L (ref 3.5–5.1)
Sodium: 139 mmol/L (ref 135–145)
Total Bilirubin: 0.6 mg/dL (ref 0.3–1.2)
Total Protein: 6.5 g/dL (ref 6.5–8.1)

## 2018-12-29 LAB — DIFFERENTIAL
Abs Immature Granulocytes: 0.02 10*3/uL (ref 0.00–0.07)
Basophils Absolute: 0 10*3/uL (ref 0.0–0.1)
Basophils Relative: 0 %
Eosinophils Absolute: 0.2 10*3/uL (ref 0.0–0.5)
Eosinophils Relative: 3 %
Immature Granulocytes: 0 %
Lymphocytes Relative: 22 %
Lymphs Abs: 1.5 10*3/uL (ref 0.7–4.0)
Monocytes Absolute: 0.5 10*3/uL (ref 0.1–1.0)
Monocytes Relative: 8 %
Neutro Abs: 4.6 10*3/uL (ref 1.7–7.7)
Neutrophils Relative %: 67 %

## 2018-12-29 LAB — RAPID URINE DRUG SCREEN, HOSP PERFORMED
Amphetamines: NOT DETECTED
Barbiturates: NOT DETECTED
Benzodiazepines: NOT DETECTED
Cocaine: NOT DETECTED
Opiates: NOT DETECTED
Tetrahydrocannabinol: NOT DETECTED

## 2018-12-29 LAB — PROTIME-INR
INR: 0.9 (ref 0.8–1.2)
Prothrombin Time: 12.4 seconds (ref 11.4–15.2)

## 2018-12-29 LAB — AMMONIA: Ammonia: 24 umol/L (ref 9–35)

## 2018-12-29 LAB — APTT: aPTT: 33 seconds (ref 24–36)

## 2018-12-29 LAB — TSH: TSH: 5.679 u[IU]/mL — ABNORMAL HIGH (ref 0.350–4.500)

## 2018-12-29 LAB — ETHANOL: Alcohol, Ethyl (B): <10 mg/dL (ref <10)

## 2018-12-29 LAB — VITAMIN B12: Vitamin B-12: 343 pg/mL (ref 180–914)

## 2018-12-29 MED ORDER — GADOBUTROL 1 MMOL/ML IV SOLN
7.0000 mL | Freq: Once | INTRAVENOUS | Status: AC | PRN
  Administered 2018-12-29: 7 mL via INTRAVENOUS

## 2018-12-29 MED ORDER — SODIUM CHLORIDE 0.9% FLUSH: 3.0000 mL | Freq: Once | INTRAVENOUS | Status: DC

## 2018-12-29 NOTE — ED Provider Notes (Signed)
[facility] Provider Note   CSN: [id number] Arrival date & time: 12/29/18  1539    History   Chief Complaint Chief Complaint  Patient presents with   Altered Mental Status   Headache    HPI [name] is a 68 y.o. male with a past medical history of atrial valve prolapse, multiple lipomas, who presents today for evaluation of memory loss.  He reports that he was driving when he realized he did not know how to drive a car.  He was able to pull over and call his daughter and his wife came to pick him up.  He reports that he realized he did not know where he was going or remember starting to drive a car.  History is obtained from patient and his wife with patient's permission.  Wife reports that over the past few weeks patient has had worsening difficulty sleeping has been only sleeping for 3 to 4 hours at night and napping during the day.  Today he made oatmeal for breakfast as he has every day for many years for his wife and forgot to take it out of the microwave which is significantly abnormal for him.  He also left a muffin container open which again his wife reports is significantly abnormal.  He does not report any trauma.  He feels like he is mostly back to normal however says that he feels like he is "not firing on all cylinders."  He denies any recent trauma.  No history of seizures.      HPI  Past Medical History:  Diagnosis Date   Arthritis    Bronchitis    history of   Dysrhythmia    ":Due to MVP"   GERD (gastroesophageal reflux disease)    Hiatal hernia    Hip joint replacement by other means 2004   Hypercalcemia    Lipoma    left forearm (3)   Mitral valve prolapse    does not see cardiologist for. last stress test 2003   Pneumonia 2009   Tumor cells, benign    lung, left side   Unstable angina ([place]) 10/28/2017    Patient Active Problem List   Diagnosis Date Noted   Abnormal EKG-dynamic TWI 10/31/2017    PVC's (premature ventricular contractions) 10/31/2017   Family history of coronary artery disease in father 10/31/2017   Unstable angina ([place]) 10/28/2017   Bilateral inguinal hernia 11/04/2016   Mobitz type 1 second degree atrioventricular block 03/16/2016   Syncope 03/15/2016   Malnutrition of moderate degree 10/21/2015   Hyperkinetic disorder    Choreiform movements 10/20/2015   Chronic respiratory failure with hypoxia ([place]) 09/14/2015   Cough    Acute respiratory distress 09/04/2015   Barrett's esophagus 09/04/2015   Dyspnea on exertion 09/04/2015   Influenza    CAP (community acquired pneumonia) 09/03/2015   Upper airway cough syndrome 08/21/2015   Chest pain with moderate risk of acute coronary syndrome 05/13/2015   Bilateral inguinal hernia (BIH) 01/08/2014   Ileus ([place]) 08/18/2012   Pain due to Left hip joint prosthesis 06/13/2011   Hyperparathyroidism, primary, s/p MI parathyroidectomy 7/18, 3.1 gm adenoma 02/19/2011   LIPOMAS, MULTIPLE 10/24/2007   HLD (hyperlipidemia) 10/24/2007   Depression 10/24/2007   ADD (attention deficit disorder) 10/24/2007   OTITIS MEDIA 10/24/2007   SINUSITIS, CHRONIC 10/24/2007   PNEUMONIA, RECURRENT 10/24/2007   HIP FRACTURE, LEFT 10/24/2007   GENITAL HERPES, HX OF 10/24/2007   TOBACCO USE, QUIT 10/24/2007  HIP REPLACEMENT, TOTAL, HX OF 10/24/2007   Other acquired absence of organ 10/24/2007   G E R D 07/13/2007    Past Surgical History:  Procedure Laterality Date   APPENDECTOMY  1984   CATARACT EXTRACTION     L and R eye   CHOLECYSTECTOMY  2002   HYDROCELE EXCISION Right 11/04/2016   Procedure: HYDROCELECTOMY ADULT;  Surgeon: Franchot Gallo, MD;  Location: [facility];  Service: Urology;  Laterality: Right;   INGUINAL HERNIA REPAIR Bilateral 11/04/2016   Procedure: HERNIA REPAIR INGUINAL ADULT BILATERAL;  Surgeon: Jackolyn Confer, MD;  Location: [facility];  Service: General;  Laterality: Bilateral;    INSERTION OF MESH Bilateral 11/04/2016   Procedure: INSERTION OF MESH;  Surgeon: Jackolyn Confer, MD;  Location: [facility];  Service: General;  Laterality: Bilateral;   JOINT REPLACEMENT     Lt hip   LEFT HEART CATH AND CORONARY ANGIOGRAPHY N/A 10/31/2017   Procedure: LEFT HEART CATH AND CORONARY ANGIOGRAPHY;  Surgeon: Troy Sine, MD;  Location: [facility];  Service: Cardiovascular;  Laterality: N/A;   MASTOIDECTOMY  1996   NASAL SEPTUM SURGERY     sinus surgery   PARATHYROIDECTOMY     TOTAL HIP REVISION  06/14/2011   Procedure: TOTAL HIP REVISION;  Surgeon: Kerin Salen;  Location: [facility];  Service: Orthopedics;  Laterality: Left;  Left Acetabular  Hip Revision        Home Medications    Prior to Admission medications   Medication Sig Start Date End Date Taking? Authorizing Provider  acetaminophen (TYLENOL) 325 MG tablet Take 2 tablets (650 mg total) by mouth every 6 (six) hours as needed for mild pain or headache. 10/31/17  Yes Kilroy, Luke K, PA-C  famotidine (PEPCID) 20 MG tablet Take 1 tablet (20 mg total) by mouth at bedtime. 09/09/15  Yes Reyne Dumas, MD  FLUoxetine (PROZAC) 40 MG capsule Take 40 mg by mouth every morning.    Yes [provider]  methylphenidate (RITALIN) 10 MG tablet Take 10 mg by mouth 3 (three) times daily.     Yes [provider]  Multiple Vitamin (MULTIVITAMIN WITH MINERALS) TABS tablet Take 1 tablet by mouth daily.   Yes [provider]  omeprazole (PRILOSEC) 20 MG capsule Take 20 mg by mouth 2 (two) times daily before a meal.    Yes [provider]  polyethylene glycol (MIRALAX / GLYCOLAX) packet Take 17 g by mouth daily as needed (for constipation.).    Yes [provider]  rosuvastatin (CRESTOR) 10 MG tablet TAKE 1 TABLET(10 MG) BY MOUTH DAILY Patient taking differently: Take 10 mg by mouth at bedtime.  11/06/18  Yes Nahser, Wonda Cheng, MD  tamsulosin (FLOMAX) 0.4 MG CAPS capsule Take 0.4 mg by  mouth daily after breakfast.    Yes [provider]  gabapentin (NEURONTIN) 100 MG capsule TAKE 1 CAPSULE BY MOUTH TWICE DAILY Patient not taking: Reported on 12/29/2018 01/20/17   Tanda Rockers, MD    Family History Family History  Problem Relation Age of Onset   Lung cancer Father 5       smoked   Heart Problems Father    Cancer Brother        prostate   Heart Problems Mother     Social History Social History   Tobacco Use   Smoking status: Former Smoker    Packs/day: 1.50    Years: 24.00    Pack years: 36.00    Types:  Cigarettes    Quit date: 07/06/1987    Years since quitting: 31.5   Smokeless tobacco: Former Systems developer  Substance Use Topics   Alcohol use: Yes    Alcohol/week: 1.0 standard drinks    Types: 1 Cans of beer per week   Drug use: No     Allergies   Ciprofloxacin hcl, Aspirin, Ciprofloxacin-dexamethasone, Gabapentin, and Wellbutrin [bupropion]   Review of Systems Review of Systems  Constitutional: Negative for chills and fever.  HENT: Negative for congestion.   Respiratory: Negative for chest tightness and shortness of breath.   Cardiovascular: Negative for chest pain.  Gastrointestinal: Negative for abdominal pain, diarrhea, nausea and vomiting.  Genitourinary: Negative for dysuria.  Musculoskeletal: Negative for back pain and neck pain.  Neurological: Positive for headaches (1/10, has resolved). Negative for dizziness, seizures, facial asymmetry, speech difficulty, weakness and light-headedness.  Psychiatric/Behavioral: Positive for confusion.  All other systems reviewed and are negative.    Physical Exam Updated Vital Signs BP 113/80    Pulse (!) 59    Temp 98.9 F (37.2 C) (Oral)    Resp 18    Ht 5' 11.5" (1.816 m)    Wt 76.2 kg    SpO2 98%    BMI 23.10 kg/m   Physical Exam Vitals signs and nursing note reviewed.  Constitutional:      General: He is not in acute distress.    Appearance: He is well-developed. He is not  ill-appearing or diaphoretic.  HENT:     Head: Normocephalic and atraumatic.     Mouth/Throat:     Mouth: Mucous membranes are moist.  Eyes:     General: No scleral icterus.       Right eye: No discharge.        Left eye: No discharge.     Extraocular Movements: Extraocular movements intact.     Conjunctiva/sclera: Conjunctivae normal.     Pupils: Pupils are equal, round, and reactive to light.  Neck:     Musculoskeletal: Normal range of motion.  Cardiovascular:     Rate and Rhythm: Normal rate and regular rhythm.     Heart sounds: Normal heart sounds.  Pulmonary:     Effort: Pulmonary effort is normal. No respiratory distress.     Breath sounds: Normal breath sounds. No stridor.  Abdominal:     General: There is no distension.     Palpations: Abdomen is soft.     Tenderness: There is no abdominal tenderness.  Musculoskeletal:        General: No swelling, tenderness or deformity.  Skin:    General: Skin is warm and dry.  Neurological:     Mental Status: He is alert and oriented to person, place, and time.     GCS: GCS eye subscore is 4. GCS verbal subscore is 5. GCS motor subscore is 6.     Cranial Nerves: No cranial nerve deficit.     Motor: No abnormal muscle tone.     Comments: Mental Status:  Alert, oriented, thought content appropriate, able to give a coherent history. Speech fluent without evidence of aphasia. Able to follow 2 step commands without difficulty.  Cranial Nerves:  II:  Peripheral visual fields grossly normal, pupils equal, round, reactive to light III,IV, VI: ptosis not present, extra-ocular motions intact bilaterally  V,VII: smile symmetric, facial light touch sensation equal VIII: hearing grossly normal to voice  X: uvula elevates symmetrically  XI: bilateral shoulder shrug symmetric and strong XII: midline tongue extension without  fassiculations Motor:  Normal tone. 5/5 in upper and lower extremities bilaterally including strong and equal grip  strength and dorsiflexion/plantar flexion Cerebellar: normal finger-to-nose with bilateral upper extremities Gait: normal gait and balance CV: distal pulses palpable throughout   Psychiatric:        Mood and Affect: Mood normal.        Behavior: Behavior normal.      ED Treatments / Results  Labs (all labs ordered are listed, but only abnormal results are displayed) Labs Reviewed  COMPREHENSIVE METABOLIC PANEL - Abnormal; Notable for the following components:      Result Value   Glucose, Bld 139 (*)    All other components within normal limits  TSH - Abnormal; Notable for the following components:   TSH 5.679 (*)    All other components within normal limits  I-STAT CHEM 8, ED - Abnormal; Notable for the following components:   Glucose, Bld 133 (*)    All other components within normal limits  CBG MONITORING, ED - Abnormal; Notable for the following components:   Glucose-Capillary 152 (*)    All other components within normal limits  PROTIME-INR  APTT  CBC  DIFFERENTIAL  URINALYSIS, ROUTINE W REFLEX MICROSCOPIC  RAPID URINE DRUG SCREEN, HOSP PERFORMED  ETHANOL  VITAMIN B12  AMMONIA  T4, FREE    EKG EKG Interpretation  Date/Time:  Friday December 29 2018 16:03:00 EDT Ventricular Rate:  78 PR Interval:  146 QRS Duration: 80 QT Interval:  368 QTC Calculation: 419 R Axis:   97 Text Interpretation:  Sinus bradycardia Low voltage QRS No acute changes No significant change since last tracing Confirmed by Varney Biles [phone]) on 12/29/2018 9:02:37 PM   Radiology Ct Head Wo Contrast  Result Date: 12/29/2018 CLINICAL DATA:  Altered mental status, forgetfulness, possible stroke EXAM: CT HEAD WITHOUT CONTRAST TECHNIQUE: Contiguous axial images were obtained from the base of the skull through the vertex without intravenous contrast. COMPARISON:  MR brain, 03/16/2016 FINDINGS: Brain: No evidence of acute infarction, hemorrhage, hydrocephalus, extra-axial collection or mass  lesion/mass effect. Vascular: No hyperdense vessel or unexpected calcification. Skull: Normal. Negative for fracture or focal lesion. Sinuses/Orbits: No acute finding. Status post left mastoidectomy. Status post bilateral maxillary antrostomy. Other: None. IMPRESSION: No acute intracranial pathology. Electronically Signed   By: Eddie Candle M.D.   On: 12/29/2018 17:34   Mr Jeri Cos And Wo Contrast  Result Date: 12/29/2018 CLINICAL DATA:  Initial evaluation for acute confusion, unexplained. EXAM: MRI HEAD WITHOUT AND WITH CONTRAST TECHNIQUE: Multiplanar, multiecho pulse sequences of the brain and surrounding structures were obtained without and with intravenous contrast. CONTRAST:  7 cc of Gadavist. COMPARISON:  Prior CT from earlier same day. FINDINGS: Brain: Generalized age-related cerebral volume loss. Mild scattered subcentimeter patchy T2/FLAIR hyperintensity within the periventricular, deep, and subcortical white matter both cerebral hemispheres, nonspecific. No abnormal foci of restricted diffusion to suggest acute or subacute ischemia. Gray-white matter differentiation maintained. No encephalomalacia to suggest chronic cortical infarction. No foci of susceptibility artifact to suggest acute or chronic intracranial hemorrhage. No mass lesion, midline shift or mass effect. No hydrocephalus. No extra-axial fluid collection. Pituitary gland suprasellar region normal. Midline structures intact. No abnormal enhancement. Vascular: Major intracranial vascular flow voids maintained. Skull and upper cervical spine: Craniocervical junction within normal limits. Upper cervical spine normal. Bone marrow signal intensity within normal limits. No scalp soft tissue abnormality. Sinuses/Orbits: Patient status post bilateral ocular lens replacement. Globes and orbital soft tissues demonstrate no acute finding. Mild  scattered mucosal thickening within the ethmoidal air cells. Paranasal sinuses are otherwise clear. Trace left  mastoid effusion noted, of doubtful significance. Inner ear structures normal. Other: None. IMPRESSION: 1. No acute intracranial abnormality. 2. Age-appropriate cerebral atrophy with mild cerebral white matter disease, nonspecific, but most commonly related to chronic microvascular ischemic disease. Electronically Signed   By: Jeannine Boga M.D.   On: 12/29/2018 23:03    Procedures Procedures (including critical care time)  Medications Ordered in ED Medications  sodium chloride flush (NS) 0.9 % injection 3 mL (has no administration in time range)  gadobutrol (GADAVIST) 1 MMOL/ML injection 7 mL (7 mLs Intravenous Contrast Given 12/29/18 2144)     Initial Impression / Assessment and Plan / ED Course  I have reviewed the triage vital signs and the nursing notes.  Pertinent labs & imaging results that were available during my care of the patient were reviewed by me and considered in my medical decision making (see chart for details).  Clinical Course as of Dec 29 2350  Fri Dec 29, 2018  2002 Spoke with Dr. Leonel Ramsay of neurology.  He recommends MRI brain with and without contrast, adding on a B12 and TSH as this will be helpful for his neurology follow-up, along with ammonia level to make sure that this is not hepatic encephalopathy.    [EH]  2007 Wife reports that patient makes her oatmeal for break fast, forgot to give it to her and went to take a shower.  He takes muffins out of a container every day and forgot to close it.  She started noticing his behavior was different about over a few weeks, she attributed it to his sleep being bad which has been bad he has been napping during the day.  He can't fall asleep and wakes up and can't go back to sleep.  Got clonidine but hasn't taken it yet.  Has been taking tylenol PM but that wasn't helping.  He talked to her daughter, not wife, reports not slurred, more frustrated.  When she got to  him he was scarred.    [EH]  2333 Patient  reevaluated.  He is alert and oriented, has not had recurrent episodes of memory loss.  His MRI is normal.  Discussed his hypothyroid and the importance of PCP follow-up.  We discussed admission for observation versus going home and he wishes to go home after we discussed the risks and benefits.   [EH]    Clinical Course User Index [EH] Lorin Glass, PA-C       Patient presents today for evaluation of an episode where he was confused, did not where he was driving too, did not remember starting to drive and forgot how to drive.  On my exam he is neurologically intact with appropriate recall of events both before and after the episode.  CT head was obtained without evidence of acute abnormality.  Labs were obtained and reviewed, his ammonia is not elevated at 24.  He does not have any significant electrolyte or hematologic derangements.  Alcohol is undetectable, UDS is negative with normal urine.  TSH is elevated at 5.679 he does not have any known history of hypothyroid and does not take any medicines.  I spoke with Dr. Leonel Ramsay of neurology who recommended MRI with and without contrast, if that is normal discharge home.  MRI was obtained without evidence of acute abnormalities, does show age-appropriate cerebral atrophy with chronic microvascular disease changes.  Not have any recurrent episodes of memory  loss while in the emergency room.  Patient was seen as a shared visit with Dr. Kathrynn Humble.  Discussed treatment options with patient including observation for admission versus discharge home.  He is requesting discharge home.  He was given syncope/seizure precautions as these are appropriate for memory loss.  Suspect either that his sleep disturbance may have caused him to have confusion and memory loss, resolved transient global amnesia versus complex migraine given presence of mild headache during event.  Ambulatory referral placed for outpatient neurology follow-up.  He is informed of his  suspected hypothyroid and need to follow-up with PCP for evaluation of this.  Return precautions were discussed with patient who states their understanding.  At the time of discharge patient denied any unaddressed complaints or concerns.  Patient is agreeable for discharge home.   Final Clinical Impressions(s) / ED Diagnoses   Final diagnoses:  Memory loss, short term  Transient alteration of awareness  Hypothyroidism, unspecified type    ED Discharge Orders         Ordered    Ambulatory referral to Neurology    Comments: An appointment is requested in approximately: 1 week   12/29/18 2345           Lorin Glass, Hershal Coria 12/30/18 0002    Varney Biles, MD 12/30/18 1229

## 2018-12-29 NOTE — ED Triage Notes (Signed)
Patient reports being forgetful the last few days.  Today was driving and realized he didn't know where he was going.  This was around 11am.  He pulled over and had his wife come to get him.  Denies having any hx of the same.  Alert and appropriate in triage.

## 2018-12-29 NOTE — ED Notes (Signed)
Patient transported to MRI 

## 2018-12-29 NOTE — Discharge Instructions (Signed)
Today your labs and MRI were reassuring.  I do not have a specific cause for your symptoms today.  Please do not drive, operate heavy machinery, or perform other tasks that would be dangerous to you or someone else if you were to have memory issues or pass out.  Please do not swim or bathe.  When you shower please make sure someone is home to monitor you for fall.    I have given you information on transient global amnesia as this is one possible cause of your memory loss.  There are other things that can cause memory loss which is why the neurology follow-up is important.

## 2018-12-30 LAB — T4, FREE: Free T4: 0.81 ng/dL (ref 0.61–1.12)

## 2018-12-30 NOTE — ED Notes (Signed)
Patient verbalizes understanding of discharge instructions. Opportunity for questioning and answers were provided. Armband removed by staff, pt discharged from ED ambulatory with taxi to pick up pt

## 2019-01-01 ENCOUNTER — Telehealth: Payer: Self-pay | Admitting: Neurology

## 2019-01-01 NOTE — Telephone Encounter (Signed)
Pt gave consent for video visit.  Pt understands that although there may be some limitations with this type of visit, we will take all precautions to reduce any security or privacy concerns.  Pt understands that this will be treated like an in office visit and we will file with pt's insurance, and there may be a patient responsible charge related to this service. °

## 2019-01-02 ENCOUNTER — Encounter: Payer: Self-pay | Admitting: Neurology

## 2019-01-02 ENCOUNTER — Telehealth (INDEPENDENT_AMBULATORY_CARE_PROVIDER_SITE_OTHER): Payer: Medicare Other | Admitting: Neurology

## 2019-01-02 DIAGNOSIS — R410 Disorientation, unspecified: Secondary | ICD-10-CM | POA: Diagnosis not present

## 2019-01-02 NOTE — Progress Notes (Signed)
PATIENT: [name] DOB: 1950/08/03  Virtual Visit via video  I connected with [name] on 01/02/19 at  by video and verified that I am speaking with the correct person using two identifiers.   I discussed the limitations, risks, security and privacy concerns of performing an evaluation and management service by video and the availability of in person appointments. I also discussed with the patient that there may be a patient responsible charge related to this service. The patient expressed understanding and agreed to proceed.  HISTORICAL  [name] is a 68 year old male, seen in request by his primary care physician Dr. Stephanie Acre, Ivin Booty, for sudden onset of confusion, initial evaluation was through virtual visit on January 02, 2019  I have reviewed and summarized the referring note from the referring physician.  He had a past medical history of ADHD, has been taking Ritalin 10 mg 3 times a day over the past 30 years, depression anxiety, taking Prozac,  He is a retired Curator for [employer], had excellent memory in the past, was noted to have gradual onset memory loss since 2015, but still functioning very well, taking care of his grandchildren at 95 months, 21 and 34 years old, enjoying reading and writing.  On December 29, 2018, while he was driving, he noted sudden onset confusion, he cannot remember where he is going, how he got there, he drove around for about 10 to 15 minutes, stopped at a parking lot in a grocery store, he was able to call his family to pick him up, he was sent to the emergency room  I personally reviewed MRI of the brain, age-appropriate mild atrophy no acute abnormality  Laboratory evaluations showed normal free T4, ammonia, B12, negative alcohol, UDS, mild elevated TSH, normal CMP with exception of mild elevated glucose 139, normal CBC   Observations/Objective: I have reviewed problem lists, medications, allergies.  Awake, alert, oriented to  history taking, and casual conversation, moving 4 extremities without difficulties.  Assessment and Plan: Acute onset of confusion  Differentiation diagnosis including transient global amnesia, TIA, versus partial seizure  MRI of the brain showed no acute abnormality  Complete evaluation with ultrasound of carotid artery, echocardiogram, EEG   Follow Up Instructions:  Return to clinic in 2 months    I discussed the assessment and treatment plan with the patient. The patient was provided an opportunity to ask questions and all were answered. The patient agreed with the plan and demonstrated an understanding of the instructions.   The patient was advised to call back or seek an in-person evaluation if the symptoms worsen or if the condition fails to improve as anticipated.  I provided 30 minutes of non-face-to-face time during this encounter.  REVIEW OF SYSTEMS: Full 14 system review of systems performed and notable only for as above All other review of systems were negative.  ALLERGIES: Allergies  Allergen Reactions  . Ciprofloxacin Hcl Other (See Comments)    Burning in ears when drops were used  . Aspirin Nausea Only and Other (See Comments)    Caused an upset stomach  . Ciprofloxacin-Dexamethasone Other (See Comments)    Burning in ears when Cipro ear drops were used  . Gabapentin Other (See Comments)    Caused weakness, dizziness, and forgetfulness   . Wellbutrin [Bupropion] Other (See Comments)    Caused altered mental status    HOME MEDICATIONS: Current Outpatient Medications  Medication Sig Dispense Refill  . acetaminophen (TYLENOL) 325 MG tablet Take  2 tablets (650 mg total) by mouth every 6 (six) hours as needed for mild pain or headache.    . famotidine (PEPCID) 20 MG tablet Take 1 tablet (20 mg total) by mouth at bedtime. 30 tablet 3  . FLUoxetine (PROZAC) 40 MG capsule Take 40 mg by mouth every morning.     . gabapentin (NEURONTIN) 100 MG capsule TAKE 1 CAPSULE  BY MOUTH TWICE DAILY (Patient not taking: Reported on 12/29/2018) 60 capsule 0  . methylphenidate (RITALIN) 10 MG tablet Take 10 mg by mouth 3 (three) times daily.      . Multiple Vitamin (MULTIVITAMIN WITH MINERALS) TABS tablet Take 1 tablet by mouth daily.    Marland Kitchen omeprazole (PRILOSEC) 20 MG capsule Take 20 mg by mouth 2 (two) times daily before a meal.     . polyethylene glycol (MIRALAX / GLYCOLAX) packet Take 17 g by mouth daily as needed (for constipation.).     Marland Kitchen rosuvastatin (CRESTOR) 10 MG tablet TAKE 1 TABLET(10 MG) BY MOUTH DAILY (Patient taking differently: Take 10 mg by mouth at bedtime. ) 90 tablet 1  . tamsulosin (FLOMAX) 0.4 MG CAPS capsule Take 0.4 mg by mouth daily after breakfast.      No current facility-administered medications for this visit.     PAST MEDICAL HISTORY: Past Medical History:  Diagnosis Date  . Arthritis   . Bronchitis    history of  . 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 GERD (gastroesophageal reflux disease)   . Hiatal hernia   . 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 Hypercalcemia   . Lipoma    left forearm (3)  . Mitral valve prolapse    does not see cardiologist for. last stress test 2003  . Pneumonia 2009  . Tumor cells, benign    lung, left side  . Unstable angina ([place]) 10/28/2017    PAST SURGICAL HISTORY: Past Surgical History:  Procedure Laterality Date  . APPENDECTOMY  1984  . CATARACT EXTRACTION     L and R eye  . CHOLECYSTECTOMY  2002  . HYDROCELE EXCISION Right 11/04/2016   Procedure: HYDROCELECTOMY ADULT;  Surgeon: Franchot Gallo, MD;  Location: [facility];  Service: Urology;  Laterality: Right;  . INGUINAL HERNIA REPAIR Bilateral 11/04/2016   Procedure: HERNIA REPAIR INGUINAL ADULT BILATERAL;  Surgeon: Jackolyn Confer, MD;  Location: [facility];  Service: General;  Laterality: Bilateral;  . INSERTION OF MESH Bilateral 11/04/2016   Procedure: INSERTION OF MESH;  Surgeon: Jackolyn Confer, MD;  Location[facility];  Service: General;   Laterality: Bilateral;  . JOINT REPLACEMENT     Lt hip  . LEFT HEART CATH AND CORONARY ANGIOGRAPHY N/A 10/31/2017   Procedure: LEFT HEART CATH AND CORONARY ANGIOGRAPHY;  Surgeon: Troy Sine, MD;  Location: [facility];  Service: Cardiovascular;  Laterality: N/A;  . MASTOIDECTOMY  1996  . NASAL SEPTUM SURGERY     sinus surgery  . PARATHYROIDECTOMY    . TOTAL HIP REVISION  06/14/2011   Procedure: TOTAL HIP REVISION;  Surgeon: Kerin Salen;  Location: [facility];  Service: Orthopedics;  Laterality: Left;  Left Acetabular  Hip Revision    FAMILY HISTORY: Family History  Problem Relation Age of Onset  . Lung cancer Father 69       smoked  . Heart Problems Father   . Cancer Brother        prostate  . Heart Problems Mother     SOCIAL HISTORY:  Social History   Socioeconomic History  . Marital status: Married    Spouse name: Arville Go  . Number of children: 3  . Years of education: masters  . Highest education level: Not on file  Occupational History    Comment: Sales  Social Needs  . Financial resource strain: Not on file  . Food insecurity    Worry: Not on file    Inability: Not on file  . Transportation needs    Medical: Not on file    Non-medical: Not on file  Tobacco Use  . Smoking status: Former Smoker    Packs/day: 1.50    Years: 24.00    Pack years: 36.00    Types: Cigarettes    Quit date: 07/06/1987    Years since quitting: 31.5  . Smokeless tobacco: Former Network engineer and Sexual Activity  . Alcohol use: Yes    Alcohol/week: 1.0 standard drinks    Types: 1 Cans of beer per week  . Drug use: No  . Sexual activity: Not on file  Lifestyle  . Physical activity    Days per week: Not on file    Minutes per session: Not on file  . Stress: Not on file  Relationships  . Social Herbalist on phone: Not on file    Gets together: Not on file    Attends religious service: Not on file    Active member of club or organization: Not on file     Attends meetings of clubs or organizations: Not on file    Relationship status: Not on file  . Intimate partner violence    Fear of current or ex partner: Not on file    Emotionally abused: Not on file    Physically abused: Not on file    Forced sexual activity: Not on file  Other Topics Concern  . 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   Caffeine - one cup daily.    Marcial Pacas, M.D. Ph.D.  [facility] [address] [place], [place] [address] Ph: [phone] Fax: [phone]  CC:  Jonathon Jordan, MD

## 2019-01-04 DIAGNOSIS — R410 Disorientation, unspecified: Secondary | ICD-10-CM | POA: Diagnosis not present

## 2019-01-04 DIAGNOSIS — E039 Hypothyroidism, unspecified: Secondary | ICD-10-CM | POA: Diagnosis not present

## 2019-01-10 ENCOUNTER — Other Ambulatory Visit: Payer: Self-pay

## 2019-01-10 ENCOUNTER — Ambulatory Visit (HOSPITAL_BASED_OUTPATIENT_CLINIC_OR_DEPARTMENT_OTHER)
Admission: RE | Admit: 2019-01-10 | Discharge: 2019-01-10 | Disposition: A | Discharge status: Home / Self Care | Payer: Medicare Other | Source: Ambulatory Visit | Attending: Neurology | Admitting: Neurology

## 2019-01-10 ENCOUNTER — Ambulatory Visit (HOSPITAL_COMMUNITY)
Admission: RE | Admit: 2019-01-10 | Discharge: 2019-01-10 | Disposition: A | Discharge status: Home / Self Care | Payer: Medicare Other | Source: Ambulatory Visit | Attending: Neurology | Admitting: Neurology

## 2019-01-10 ENCOUNTER — Telehealth: Payer: Self-pay | Admitting: Neurology

## 2019-01-10 DIAGNOSIS — I499 Cardiac arrhythmia, unspecified: Secondary | ICD-10-CM | POA: Insufficient documentation

## 2019-01-10 DIAGNOSIS — I6523 Occlusion and stenosis of bilateral carotid arteries: Secondary | ICD-10-CM | POA: Insufficient documentation

## 2019-01-10 DIAGNOSIS — I200 Unstable angina: Secondary | ICD-10-CM | POA: Insufficient documentation

## 2019-01-10 DIAGNOSIS — R410 Disorientation, unspecified: Secondary | ICD-10-CM | POA: Diagnosis not present

## 2019-01-10 NOTE — Progress Notes (Signed)
  Echocardiogram 2D Echocardiogram has been performed.  Jannett Celestine 01/10/2019, 1:55 PM

## 2019-01-10 NOTE — Telephone Encounter (Signed)
Spoke to patient and informed him of the results below.  He verbalized understanding for moderate exercise and to increase his water intake.  He scheduled a follow up on 02/26/2019.

## 2019-01-10 NOTE — Progress Notes (Signed)
Carotid artery duplex has been completed. Preliminary results can be found in CV Proc through chart review.   01/10/19 2:05 PM Carlos Levering RVT

## 2019-01-10 NOTE — Telephone Encounter (Signed)
Please call patient, ultrasound of carotid arteries showed less than 39% stenosis of bilateral internal carotid artery, bilateral vertebral arteries were antegrade flow  Advised him continue moderate exercise increase water intake  Give him a follow up in August.

## 2019-01-18 DIAGNOSIS — H7293 Unspecified perforation of tympanic membrane, bilateral: Secondary | ICD-10-CM | POA: Diagnosis not present

## 2019-02-14 DIAGNOSIS — Z961 Presence of intraocular lens: Secondary | ICD-10-CM | POA: Diagnosis not present

## 2019-02-14 DIAGNOSIS — H532 Diplopia: Secondary | ICD-10-CM | POA: Diagnosis not present

## 2019-02-14 DIAGNOSIS — H43813 Vitreous degeneration, bilateral: Secondary | ICD-10-CM | POA: Diagnosis not present

## 2019-02-26 ENCOUNTER — Encounter: Payer: Self-pay | Admitting: Neurology

## 2019-02-26 ENCOUNTER — Other Ambulatory Visit: Payer: Self-pay

## 2019-02-26 ENCOUNTER — Ambulatory Visit (INDEPENDENT_AMBULATORY_CARE_PROVIDER_SITE_OTHER): Payer: Medicare Other | Admitting: Neurology

## 2019-02-26 VITALS — BP 105/67 | HR 75 | Temp 98.0°F | Ht 71.5 in | Wt 171.5 lb

## 2019-02-26 DIAGNOSIS — R410 Disorientation, unspecified: Secondary | ICD-10-CM | POA: Diagnosis not present

## 2019-02-26 NOTE — Progress Notes (Signed)
PATIENT: [name] DOB: [birth year]  Chief Complaint  Patient presents with  . Episode of Confusion    He is here with his wife, Arville Go, to follow up from episode of confusion on 12/29/2018.  Reports several other similar events have occurred.  He has stopped driving due to getting lost. He would like to review his US Carotid and ECHO results.  He has EEG pending on 03/19/2019.     HISTORICAL [name] is a 68 year old male, seen in request by his primary care physician Dr. Stephanie Acre, Ivin Booty, for sudden onset of confusion, initial evaluation was through virtual visit on January 02, 2019  I have reviewed and summarized the referring note from the referring physician.  He had a past medical history of ADHD, has been taking Ritalin 10 mg 3 times a day over the past 30 years, depression anxiety, taking Prozac,  He is a retired Curator for [employer], had excellent memory in the past, was noted to have gradual onset memory loss since 2015, but still functioning very well, taking care of his grandchildren at 25 months, 58 and 73 years old, enjoying reading and writing.  On December 29, 2018, while he was driving, he noted sudden onset confusion, he cannot remember where he is going, how he got there, he drove around for about 10 to 15 minutes, stopped at a parking lot in a grocery store, he was able to call his family to pick him up, he was sent to the emergency room  I personally reviewed MRI of the brain, age-appropriate mild atrophy no acute abnormality  Laboratory evaluations showed normal free T4, ammonia, B12, negative alcohol, UDS, mild elevated TSH, normal CMP with exception of mild elevated glucose 139, normal CBC  Update February 26, 2019:  He is accompanied by his wife at today's clinic visit, patient continues to have significant memory loss, he reported upper respiratory on June 22, 2019, lasting for 50 days, violent cough, repeat COVID-19 antibody testing was positive in  March and June 2020  Since March 2020, he noticed increased memory loss, he described mentally exhausted all the time, confused easily, response time is slower, there also anxious moment, he can no longer solve complex issues, feels frustrated easily, difficulty with word finding, had vivid dreams, got lost while driving, frequent missed spelling, could not recall the month, or even season, increased bilateral tinnitus, difficulty sleeping, seeing moving objects in his peripheral visual field  He had long history of depression, is still taking fluoxetine 40 mg daily,  I was able to see his neuropsychiatric evaluation by Dr. Marlane Hatcher, initial evaluation was on April 29, 2017 reported a history of daily cigarette use 1/2 pack for 22 years, marijuana daily from [age], alcohol use 2-5 beers 5 times a week, stopped about 15 years ago  He had long history of depression, self admitted to psychiatric hospital 1973, after experiencing flashbacks of trauma, that particular neuropsychiatric evaluation showed sufficient impairment in executive functioning, with possibility of reversible, there was no impairment in memory,  He did have a follow-up repeat neuropsychiatric evaluation in January 2020, which showed marked improvement across multiple cognitive domains in comparison to his scores in October 2018, giving his current presentation scores, assess subjective reports of dramatic improvement of cognitive symptoms the past 10 to 11 months, he was suggested to continue follow-up with his psychiatrist Dr. Casimiro Needle  Laboratory evaluations in June 2020: Normal B12, mild elevated TSH 5.6, UDS,  REVIEW OF SYSTEMS: Full 14  system review of systems performed and notable only for as above All other review of systems were negative.  ALLERGIES: Allergies  Allergen Reactions  . Ciprofloxacin Hcl Other (See Comments)    Burning in ears when drops were used  . Ciprofloxacin-Dexamethasone Other (See Comments)     Burning in ears when Cipro ear drops were used  . Gabapentin Other (See Comments)    Caused weakness, dizziness, and forgetfulness   . Wellbutrin [Bupropion] Other (See Comments)    Caused altered mental status    HOME MEDICATIONS: Current Outpatient Medications  Medication Sig Dispense Refill  . cloNIDine (CATAPRES) 0.1 MG tablet TK 1 T PO QD HS    . famotidine (PEPCID) 20 MG tablet Take 1 tablet (20 mg total) by mouth at bedtime. 30 tablet 3  . FLUoxetine (PROZAC) 40 MG capsule Take 40 mg by mouth every morning.     . methylphenidate (RITALIN) 10 MG tablet Take 10 mg by mouth 3 (three) times daily.      . Multiple Vitamin (MULTIVITAMIN WITH MINERALS) TABS tablet Take 1 tablet by mouth daily.    Marland Kitchen omeprazole (PRILOSEC) 20 MG capsule Take 20 mg by mouth 2 (two) times daily before a meal.     . polyethylene glycol (MIRALAX / GLYCOLAX) packet Take 17 g by mouth daily as needed (for constipation.).     Marland Kitchen rosuvastatin (CRESTOR) 10 MG tablet TAKE 1 TABLET(10 MG) BY MOUTH DAILY (Patient taking differently: Take 10 mg by mouth at bedtime. ) 90 tablet 1  . SYNTHROID 25 MCG tablet TK 1 T PO ONCE D IN THE MORNING OES    . tamsulosin (FLOMAX) 0.4 MG CAPS capsule Take 0.4 mg by mouth daily after breakfast.      No current facility-administered medications for this visit.     PAST MEDICAL HISTORY: Past Medical History:  Diagnosis Date  . Arthritis   . Bronchitis    history of  . 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 GERD (gastroesophageal reflux disease)   . Hiatal hernia   . 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 Hypercalcemia   . Lipoma    left forearm (3)  . Mitral valve prolapse    does not see cardiologist for. last stress test 2003  . Pneumonia 2009  . Tumor cells, benign    lung, left side  . Unstable angina ([place]) 10/28/2017    PAST SURGICAL HISTORY: Past Surgical History:  Procedure Laterality Date  . APPENDECTOMY  1984  . CATARACT EXTRACTION     L and R eye  .  CHOLECYSTECTOMY  2002  . HYDROCELE EXCISION Right 11/04/2016   Procedure: HYDROCELECTOMY ADULT;  Surgeon: Franchot Gallo, MD;  [facility];  Service: Urology;  Laterality: Right;  . INGUINAL HERNIA REPAIR Bilateral 11/04/2016   Procedure: HERNIA REPAIR INGUINAL ADULT BILATERAL;  Surgeon: Jackolyn Confer, MD;  [facility];  Service: General;  Laterality: Bilateral;  . INSERTION OF MESH Bilateral 11/04/2016   Procedure: INSERTION OF MESH;  Surgeon: Jackolyn Confer, MD;  [facility];  Service: General;  Laterality: Bilateral;  . JOINT REPLACEMENT     Lt hip  . LEFT HEART CATH AND CORONARY ANGIOGRAPHY N/A 10/31/2017   Procedure: LEFT HEART CATH AND CORONARY ANGIOGRAPHY;  Surgeon: Troy Sine, MD;  Location: [facility];  Service: Cardiovascular;  Laterality: N/A;  . MASTOIDECTOMY  1996  . NASAL SEPTUM SURGERY     sinus surgery  . PARATHYROIDECTOMY    .  TOTAL HIP REVISION  06/14/2011   Procedure: TOTAL HIP REVISION;  Surgeon: Kerin Salen;  Location: [facility];  Service: Orthopedics;  Laterality: Left;  Left Acetabular  Hip Revision    FAMILY HISTORY: Family History  Problem Relation Age of Onset  . Lung cancer Father 53       smoked  . Heart Problems Father   . Cancer Brother        prostate  . Heart Problems Mother     SOCIAL HISTORY: Social History   Socioeconomic History  . Marital status: Married    Spouse name: Arville Go  . Number of children: 3  . Years of education: masters  . Highest education level: Not on file  Occupational History    Comment: Sales  Social Needs  . Financial resource strain: Not on file  . Food insecurity    Worry: Not on file    Inability: Not on file  . Transportation needs    Medical: Not on file    Non-medical: Not on file  Tobacco Use  . Smoking status: Former Smoker    Packs/day: 1.50    Years: 24.00    Pack years: 36.00    Types: Cigarettes    Quit date: 07/06/1987    Years since quitting: 31.6  . Smokeless tobacco:  Former Network engineer and Sexual Activity  . Alcohol use: Yes    Alcohol/week: 1.0 standard drinks    Types: 1 Cans of beer per week  . Drug use: No  . Sexual activity: Not on file  Lifestyle  . Physical activity    Days per week: Not on file    Minutes per session: Not on file  . Stress: Not on file  Relationships  . Social Herbalist on phone: Not on file    Gets together: Not on file    Attends religious service: Not on file    Active member of club or organization: Not on file    Attends meetings of clubs or organizations: Not on file    Relationship status: Not on file  . Intimate partner violence    Fear of current or ex partner: Not on file    Emotionally abused: Not on file    Physically abused: Not on file    Forced sexual activity: Not on file  Other Topics Concern  . 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   Caffeine - one cup daily.     PHYSICAL EXAM   Vitals:   02/26/19 1408  BP: 105/67  Pulse: 75  Temp: 98 F (36.7 C)  Weight: 171 lb 8 oz (77.8 kg)  Height: 5' 11.5" (1.816 m)    Not recorded      Body mass index is 23.59 kg/m.  PHYSICAL EXAMNIATION:  Gen: NAD, conversant, well nourised, obese, well groomed                     Cardiovascular: Regular rate rhythm, no peripheral edema, warm, nontender. Eyes: Conjunctivae clear without exudates or hemorrhage Neck: Supple, no carotid bruits. Pulmonary: Clear to auscultation bilaterally   NEUROLOGICAL EXAM: MENTAL STATUS: Speech:    Speech is normal; fluent and spontaneous with normal comprehension.  Cognition:     Orientation to time, place and person     Normal recent and remote memory  Normal Attention span and concentration     Normal Language, naming, repeating,spontaneous speech     Fund of knowledge   CRANIAL NERVES: CN II: Visual fields are full to  confrontation. Pupils are round equal and briskly reactive to light. CN III, IV, VI: extraocular movement are normal. No ptosis. CN V: Facial sensation is intact to pinprick in all 3 divisions bilaterally. Corneal responses are intact.  CN VII: Face is symmetric with normal eye closure and smile. CN VIII: Hearing is normal to rubbing fingers CN IX, X: Palate elevates symmetrically. Phonation is normal. CN XI: Head turning and shoulder shrug are intact CN XII: Tongue is midline with normal movements and no atrophy.  MOTOR: There is no pronator drift of out-stretched arms. Muscle bulk and tone are normal. Muscle strength is normal.  REFLEXES: Reflexes are 2+ and symmetric at the biceps, triceps, knees, and ankles. Plantar responses are flexor.  SENSORY: Intact to light touch, pinprick, positional sensation and vibratory sensation are intact in fingers and toes.  COORDINATION: Rapid alternating movements and fine finger movements are intact. There is no dysmetria on finger-to-nose and heel-knee-shin.    GAIT/STANCE: Posture is normal. Gait is steady with normal steps, base, arm swing, and turning. Heel and toe walking are normal. Tandem gait is normal.  Romberg is absent.   DIAGNOSTIC DATA (LABS, IMAGING, TESTING) - I reviewed patient records, labs, notes, testing and imaging myself where available.   ASSESSMENT AND PLAN  SRIVATSA MASHEK is a 68 y.o. male   Constellation of concerns about cognitive malfunction, in the setting of long-term antidepression, continued mood disorder  MRI of the brain showed no acute abnormality, age-appropriate mild generalized atrophy, mild small vessel disease  Laboratory evaluation to rule out treatable etiology,  The rapid fluctuation of cognitive function suggest that that his underline mood disorder play a major role in his complains of cognitive malfunction  Refer him to a psychologist  Marcial Pacas, M.D. Ph.D.  [facility]  [address], [place], [place] [address] Ph: [phone] Fax: [phone]  CC: Referring Provider

## 2019-02-27 LAB — HIV ANTIBODY (ROUTINE TESTING W REFLEX): HIV Screen 4th Generation wRfx: NONREACTIVE

## 2019-02-27 LAB — C-REACTIVE PROTEIN: CRP: 2 mg/L (ref 0–10)

## 2019-02-27 LAB — FOLATE: Folate: >20 ng/mL (ref >3.0)

## 2019-02-27 LAB — ANA W/REFLEX IF POSITIVE: Anti Nuclear Antibody (ANA): NEGATIVE

## 2019-02-27 LAB — SEDIMENTATION RATE: Sed Rate: 2 mm/hr (ref 0–30)

## 2019-02-27 LAB — RPR: RPR Ser Ql: NONREACTIVE

## 2019-03-01 DIAGNOSIS — M25552 Pain in left hip: Secondary | ICD-10-CM | POA: Diagnosis not present

## 2019-03-06 DIAGNOSIS — S76012D Strain of muscle, fascia and tendon of left hip, subsequent encounter: Secondary | ICD-10-CM | POA: Diagnosis not present

## 2019-03-06 DIAGNOSIS — Z96642 Presence of left artificial hip joint: Secondary | ICD-10-CM | POA: Diagnosis not present

## 2019-03-07 ENCOUNTER — Other Ambulatory Visit: Payer: Self-pay

## 2019-03-07 DIAGNOSIS — S76012D Strain of muscle, fascia and tendon of left hip, subsequent encounter: Secondary | ICD-10-CM | POA: Diagnosis not present

## 2019-03-07 DIAGNOSIS — Z96642 Presence of left artificial hip joint: Secondary | ICD-10-CM | POA: Diagnosis not present

## 2019-03-08 DIAGNOSIS — S76012D Strain of muscle, fascia and tendon of left hip, subsequent encounter: Secondary | ICD-10-CM | POA: Diagnosis not present

## 2019-03-08 DIAGNOSIS — Z96642 Presence of left artificial hip joint: Secondary | ICD-10-CM | POA: Diagnosis not present

## 2019-03-09 DIAGNOSIS — Z96642 Presence of left artificial hip joint: Secondary | ICD-10-CM | POA: Diagnosis not present

## 2019-03-09 DIAGNOSIS — S76012D Strain of muscle, fascia and tendon of left hip, subsequent encounter: Secondary | ICD-10-CM | POA: Diagnosis not present

## 2019-03-16 DIAGNOSIS — E039 Hypothyroidism, unspecified: Secondary | ICD-10-CM | POA: Diagnosis not present

## 2019-03-19 ENCOUNTER — Other Ambulatory Visit: Payer: Self-pay

## 2019-03-19 ENCOUNTER — Ambulatory Visit (INDEPENDENT_AMBULATORY_CARE_PROVIDER_SITE_OTHER): Payer: Medicare Other | Admitting: Neurology

## 2019-03-19 ENCOUNTER — Telehealth: Payer: Self-pay | Admitting: Neurology

## 2019-03-19 DIAGNOSIS — E039 Hypothyroidism, unspecified: Secondary | ICD-10-CM | POA: Diagnosis not present

## 2019-03-19 DIAGNOSIS — R410 Disorientation, unspecified: Secondary | ICD-10-CM | POA: Diagnosis not present

## 2019-03-19 DIAGNOSIS — G4700 Insomnia, unspecified: Secondary | ICD-10-CM | POA: Diagnosis not present

## 2019-03-19 DIAGNOSIS — I479 Paroxysmal tachycardia, unspecified: Secondary | ICD-10-CM | POA: Diagnosis not present

## 2019-03-19 DIAGNOSIS — R413 Other amnesia: Secondary | ICD-10-CM | POA: Diagnosis not present

## 2019-03-19 NOTE — Telephone Encounter (Signed)
Pt would like EEG results mailed to him- once available. FYI

## 2019-03-20 DIAGNOSIS — S76012D Strain of muscle, fascia and tendon of left hip, subsequent encounter: Secondary | ICD-10-CM | POA: Diagnosis not present

## 2019-03-20 DIAGNOSIS — Z96642 Presence of left artificial hip joint: Secondary | ICD-10-CM | POA: Diagnosis not present

## 2019-03-22 NOTE — Procedures (Signed)
   HISTORY: 68 years old male, complains of memory loss, confusion spells  TECHNIQUE:  This is a routine 16 channel EEG recording with one channel devoted to a limited EKG recording.  It was performed during wakefulness, drowsiness and asleep.  Hyperventilation and photic stimulation were performed as activating procedures.  There are minimum muscle and movement artifact noted.  Upon maximum arousal, posterior dominant waking rhythm consistent of rhythmic alpha range activity, with frequency of 9 hz. Activities are symmetric over the bilateral posterior derivations and attenuated with eye opening.  Hyperventilation produced mild/moderate buildup with higher amplitude and the slower activities noted.  Photic stimulation did not alter the tracing.  During EEG recording, patient developed drowsiness and no deeper stage of sleep was achieved During EEG recording, there was no epileptiform discharge noted.  EKG demonstrate sinus rhythm, with heart rate of 78 bpm  CONCLUSION: This is a  normal awake EEG.  There is no electrodiagnostic evidence of epileptiform discharge.  Marcial Pacas, M.D. Ph.D.  [facility] [place], [place] [address] Phone: [phone] Fax:      [phone]

## 2019-03-23 DIAGNOSIS — S76012D Strain of muscle, fascia and tendon of left hip, subsequent encounter: Secondary | ICD-10-CM | POA: Diagnosis not present

## 2019-03-23 DIAGNOSIS — Z96642 Presence of left artificial hip joint: Secondary | ICD-10-CM | POA: Diagnosis not present

## 2019-03-27 DIAGNOSIS — G3184 Mild cognitive impairment, so stated: Secondary | ICD-10-CM | POA: Diagnosis not present

## 2019-03-27 DIAGNOSIS — S76012D Strain of muscle, fascia and tendon of left hip, subsequent encounter: Secondary | ICD-10-CM | POA: Diagnosis not present

## 2019-03-27 DIAGNOSIS — F330 Major depressive disorder, recurrent, mild: Secondary | ICD-10-CM | POA: Diagnosis not present

## 2019-03-27 DIAGNOSIS — Z96642 Presence of left artificial hip joint: Secondary | ICD-10-CM | POA: Diagnosis not present

## 2019-03-27 DIAGNOSIS — F411 Generalized anxiety disorder: Secondary | ICD-10-CM | POA: Diagnosis not present

## 2019-03-28 DIAGNOSIS — H9212 Otorrhea, left ear: Secondary | ICD-10-CM | POA: Diagnosis not present

## 2019-03-29 DIAGNOSIS — Z96642 Presence of left artificial hip joint: Secondary | ICD-10-CM | POA: Diagnosis not present

## 2019-03-29 DIAGNOSIS — S76012D Strain of muscle, fascia and tendon of left hip, subsequent encounter: Secondary | ICD-10-CM | POA: Diagnosis not present

## 2019-03-30 ENCOUNTER — Telehealth: Payer: Self-pay | Admitting: Cardiovascular Disease

## 2019-03-30 NOTE — Telephone Encounter (Signed)
New message   STAT if HR is under 50 or over 120 (normal HR is 60-100 beats per minute)  1) What is your heart rate? Patient states that hr goes up with minimal exertion,  His hr is at this time 82  2) Do you have a log of your heart rate readings (document readings)? Yes   3) Do you have any other symptoms?fatigue, tire easily, weak

## 2019-03-30 NOTE — Telephone Encounter (Signed)
Spoke with patient who states he would like an appointment with Dr. Acie Fredrickson. States he had Covid-19 in January and since he has recovered, he is having HR of 160 bpm with mild exertion. States he was playing corn hole and after throwing just 4 bags, he was completely exhausted, out of breath, and HR was high. Complains of chronic fatigue, elevated HR with minimal exertion. Denies orthopnea. I advised him to adequately hydrate and the importance of good nutrition. Never had a positive Covid screen but tested positive for antibodies in April and June. Denies symptoms of Covid-19 at present. He has been evaluated for memory loss since he recovered from the virus and was advised not to go to appointments alone. I spoke with his wife also and she verified that she will accompany patient to appointment. Both patient and wife deny symptoms of Covid-19 and are aware of our policies regarding screening upon arrival. Appointment scheduled for 9/29 with Dr. Acie Fredrickson and they were thankful for the call.

## 2019-04-02 ENCOUNTER — Other Ambulatory Visit: Payer: Self-pay

## 2019-04-02 DIAGNOSIS — F0391 Unspecified dementia with behavioral disturbance: Secondary | ICD-10-CM | POA: Diagnosis not present

## 2019-04-02 DIAGNOSIS — G4700 Insomnia, unspecified: Secondary | ICD-10-CM | POA: Diagnosis not present

## 2019-04-02 NOTE — Telephone Encounter (Signed)
ERROR

## 2019-04-03 ENCOUNTER — Ambulatory Visit (INDEPENDENT_AMBULATORY_CARE_PROVIDER_SITE_OTHER): Payer: Medicare Other | Admitting: Cardiovascular Disease

## 2019-04-03 ENCOUNTER — Encounter: Payer: Self-pay | Admitting: Cardiovascular Disease

## 2019-04-03 ENCOUNTER — Other Ambulatory Visit: Payer: Self-pay

## 2019-04-03 DIAGNOSIS — R000 Tachycardia, unspecified: Secondary | ICD-10-CM

## 2019-04-03 MED ORDER — METOPROLOL SUCCINATE ER 25 MG PO TB24: 25.0000 mg | ORAL_TABLET | Freq: Every day | ORAL | 3 refills | Status: DC

## 2019-04-03 NOTE — Progress Notes (Signed)
Cardiology Office Note   Date:  04/03/2019   ID:  VERA WOLFFE, DOB 1950/11/07, MRN [id number]  PCP:  Jonathon Jordan, MD  Cardiologist:   Mertie Moores, MD   Chief Complaint  Patient presents with  . Chest Pain   Problem List 1. Chest pain 2. Hiatal hernia 3. GERD 4. Barretts esophagitis    [name] is a 68 y.o. male who presents for evaluation of his CP Would wake him up at night.  Not associated with eating or drinking. Various times of the day  Does not exercise so he does not know if it is exacerbated with exertion .  Would become very fatigued with household chores but not cause chest pain  for the past year.  Gets very tired vaccuming 1/2 of the appt.  Walks the dog occasionally . Used to walk 1 1/2 miles a day - stopped walking this in may , 2015  Pains will ast 25-45 minutes.  sulcrafate has helped the pains quite a bit.    October 27, 2017:  [name] is seen today for urgent work in for episodes of chest discomfort.  Is been seeing Dr. Oletta Lamas for a GI evaluation and evaluation is been relatively negative.  Has been having lots of chest pain .   The thought it was GERD For the past week or so , has moderate CP  3-4 / 10  Last 15- 20 minutes Associated with nausea  Does not exercise much but pain does not seem to be exertional . In 2017, he had flu shot and pneumonia shot.  Had a pneumonia like illness folowing that , lost 100 lbs  Was not able to walk  Has recovered well.   Still is slightly weak.   Does not have much stamina   I saw him in 2016,    GXT was normal  Echocardiogram in September, 2017 reveals normal left ventricular systolic function.  Ejection fraction is 50 to 55%.  Nov 15, 2017:  [name] is seen for follow-up.  He has a family history of coronary artery disease and has hyperlipidemia. [name] called after his last visit was complaining of more chest discomfort.  We schedule him for a heart catheterization.   Heart cath revealed  mild coronary artery disease involving the LAD, circumflex vessel and right coronary artery.  The cath site was initially attempted from the right radial artery and was later transitioned to the right femoral artery.  He still has some right arm pain and tenderness.  No further episodes of CP.  No real exercise.  Still has episodes of chest "presence" that then worsens to become a burning  Has had an ECG - no esophageal issues  Is not sleeping well   April 03, 2019:  [name] is seen today for follow-up of his mild coronary artery disease and hyperlipidemia.  Was seen with wife,  [name].  Was diagnosed with dementia yesterday  - unspecified as of now Had covid he thinks ( was sick for 2 months )  Has tested + for antibodies.  Still gets out of breath   Had an episode of tachycardia with minimal exercions  Echo in July, 2020 shows normal LV function .    Past Medical History:  Diagnosis Date  . Arthritis   . Bronchitis    history of  . 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 GERD (gastroesophageal reflux disease)   . Hiatal hernia   . Hip joint replacement by  other means 2004  . Hypercalcemia   . Lipoma    left forearm (3)  . Mitral valve prolapse    does not see cardiologist for. last stress test 2003  . Pneumonia 2009  . Tumor cells, benign    lung, left side  . Unstable angina ([place]) 10/28/2017    Past Surgical History:  Procedure Laterality Date  . APPENDECTOMY  1984  . CATARACT EXTRACTION     L and R eye  . CHOLECYSTECTOMY  2002  . HYDROCELE EXCISION Right 11/04/2016   Procedure: HYDROCELECTOMY ADULT;  Surgeon: Franchot Gallo, MD;  Location: [facility];  Service: Urology;  Laterality: Right;  . INGUINAL HERNIA REPAIR Bilateral 11/04/2016   Procedure: HERNIA REPAIR INGUINAL ADULT BILATERAL;  Surgeon: Jackolyn Confer, MD;  Location: [facility];  Service: General;  Laterality: Bilateral;  . INSERTION OF MESH Bilateral 11/04/2016   Procedure: INSERTION OF MESH;  Surgeon: Jackolyn Confer, MD;  Location: [facility];  Service: General;  Laterality: Bilateral;  . JOINT REPLACEMENT     Lt hip  . LEFT HEART CATH AND CORONARY ANGIOGRAPHY N/A 10/31/2017   Procedure: LEFT HEART CATH AND CORONARY ANGIOGRAPHY;  Surgeon: Troy Sine, MD;  Location: [facility];  Service: Cardiovascular;  Laterality: N/A;  . MASTOIDECTOMY  1996  . NASAL SEPTUM SURGERY     sinus surgery  . PARATHYROIDECTOMY    . TOTAL HIP REVISION  06/14/2011   Procedure: TOTAL HIP REVISION;  Surgeon: Kerin Salen;  Location: [facility];  Service: Orthopedics;  Laterality: Left;  Left Acetabular  Hip Revision     Current Outpatient Medications  Medication Sig Dispense Refill  . aspirin 81 MG chewable tablet Chew 81 mg by mouth daily.    . famotidine (PEPCID) 20 MG tablet Take 1 tablet (20 mg total) by mouth at bedtime. 30 tablet 3  . FLUoxetine (PROZAC) 40 MG capsule Take 40 mg by mouth every morning.     . methylphenidate (RITALIN) 10 MG tablet Take 10 mg by mouth 3 (three) times daily.      . Multiple Vitamin (MULTIVITAMIN WITH MINERALS) TABS tablet Take 1 tablet by mouth daily.    . nitroGLYCERIN (NITROSTAT) 0.4 MG SL tablet     . omeprazole (PRILOSEC) 20 MG capsule Take 20 mg by mouth 2 (two) times daily before a meal.     . polyethylene glycol (MIRALAX / GLYCOLAX) packet Take 17 g by mouth daily as needed (for constipation.).     Marland Kitchen rosuvastatin (CRESTOR) 10 MG tablet TAKE 1 TABLET(10 MG) BY MOUTH DAILY 90 tablet 1  . SYNTHROID 25 MCG tablet TK 1 T PO ONCE D IN THE MORNING OES    . tamsulosin (FLOMAX) 0.4 MG CAPS capsule Take 0.4 mg by mouth daily after breakfast.     . metoprolol succinate (TOPROL XL) 25 MG 24 hr tablet Take 1 tablet (25 mg total) by mouth daily. 90 tablet 3   No current facility-administered medications for this visit.     Allergies:   Ciprofloxacin hcl, Bupropion, Ciprofloxacin-dexamethasone, and Gabapentin    Social History:  The patient  reports that he quit smoking about  31 years ago. His smoking use included cigarettes. He has a 36.00 pack-year smoking history. He has quit using smokeless tobacco. He reports current alcohol use of about 1.0 standard drinks of alcohol per week. He reports that he does not use drugs.   Family History:  The patient's family history includes Cancer in his brother;  Heart Problems in his father and mother; Lung cancer (age of onset: 21) in his father.    ROS:   Noted in current history, otherwise review of systems is negative.   Physical Exam: Blood pressure 122/82, pulse 96, height 6' (1.829 m), weight 170 lb (77.1 kg), SpO2 96 %.  GEN:  Well nourished, well developed in no acute distress HEENT: Normal NECK: No JVD; No carotid bruits LYMPHATICS: No lymphadenopathy CARDIAC: RRR, no murmurs, rubs, gallops RESPIRATORY:  Clear to auscultation without rales, wheezing or rhonchi  ABDOMEN: Soft, non-tender, non-distended MUSCULOSKELETAL:  No edema; No deformity  SKIN: Warm and dry NEUROLOGIC:  Alert and oriented x 3    EKG:   April 03, 2019: Normal sinus rhythm at 96 beats minute.  Nonspecific ST and T wave abnormalities.  Recent Labs: 12/29/2018: ALT 24; BUN 18; Creatinine, Ser 1.00; Hemoglobin 15.0; Platelets 173; Potassium 4.1; Sodium 139; TSH 5.679    Lipid Panel    Component Value Date/Time   CHOL 134 02/13/2018 0851   TRIG 137 02/13/2018 0851   HDL 41 02/13/2018 0851   CHOLHDL 3.3 02/13/2018 0851   CHOLHDL 3.4 10/29/2017 0352   VLDL 14 10/29/2017 0352   LDLCALC 66 02/13/2018 0851      Wt Readings from Last 3 Encounters:  04/03/19 170 lb (77.1 kg)  02/26/19 171 lb 8 oz (77.8 kg)  12/29/18 168 lb (76.2 kg)      Other studies Reviewed: Additional studies/ records that were reviewed today include: . Review of the above records demonstrates:    ASSESSMENT AND PLAN:  1.  Tahycardia:   HR has been fast.   ? Due to Ritalin .  Will ask Toprol XL 25 mg a day  Return in 6 months to see an APP  I'll see  himin 1 year   2.  Hyperlipidemia:    Managed by primary MD    Current medicines are reviewed at length with the patient today.  The patient does not have concerns regarding medicines.  The following changes have been made:  no change  Labs/ tests ordered today include:  No orders of the defined types were placed in this encounter.    Disposition:   We will check lipids again in 3 months.  I will see him in 6 months.    Mertie Moores, MD  04/03/2019 2:43 PM    [address], [place], [place]  [address] Phone: [phone]; Fax: [phone]

## 2019-04-03 NOTE — Patient Instructions (Signed)
Medication Instructions:  1) START TOPROL XL 25 mg daily If you need a refill on your cardiac medications before your next appointment, please call your pharmacy.    Follow-Up: Your provider wants you to follow-up in: 6 months with Dr. Elmarie Shiley assistant.

## 2019-04-04 DIAGNOSIS — Z23 Encounter for immunization: Secondary | ICD-10-CM | POA: Diagnosis not present

## 2019-04-05 DIAGNOSIS — Z96642 Presence of left artificial hip joint: Secondary | ICD-10-CM | POA: Diagnosis not present

## 2019-04-05 DIAGNOSIS — S76012D Strain of muscle, fascia and tendon of left hip, subsequent encounter: Secondary | ICD-10-CM | POA: Diagnosis not present

## 2019-04-11 DIAGNOSIS — F3340 Major depressive disorder, recurrent, in remission, unspecified: Secondary | ICD-10-CM | POA: Diagnosis not present

## 2019-04-12 DIAGNOSIS — S76012D Strain of muscle, fascia and tendon of left hip, subsequent encounter: Secondary | ICD-10-CM | POA: Diagnosis not present

## 2019-05-01 DIAGNOSIS — H7293 Unspecified perforation of tympanic membrane, bilateral: Secondary | ICD-10-CM | POA: Diagnosis not present

## 2019-05-01 DIAGNOSIS — Z6824 Body mass index (BMI) 24.0-24.9, adult: Secondary | ICD-10-CM | POA: Diagnosis not present

## 2019-05-01 DIAGNOSIS — Z20828 Contact with and (suspected) exposure to other viral communicable diseases: Secondary | ICD-10-CM | POA: Diagnosis not present

## 2019-05-01 DIAGNOSIS — H66002 Acute suppurative otitis media without spontaneous rupture of ear drum, left ear: Secondary | ICD-10-CM | POA: Diagnosis not present

## 2019-05-03 ENCOUNTER — Other Ambulatory Visit: Payer: Self-pay | Admitting: Cardiovascular Disease

## 2019-05-14 DIAGNOSIS — F909 Attention-deficit hyperactivity disorder, unspecified type: Secondary | ICD-10-CM | POA: Diagnosis not present

## 2019-05-14 DIAGNOSIS — R4189 Other symptoms and signs involving cognitive functions and awareness: Secondary | ICD-10-CM | POA: Diagnosis not present

## 2019-05-14 DIAGNOSIS — R419 Unspecified symptoms and signs involving cognitive functions and awareness: Secondary | ICD-10-CM | POA: Diagnosis not present

## 2019-05-14 DIAGNOSIS — I998 Other disorder of circulatory system: Secondary | ICD-10-CM | POA: Diagnosis not present

## 2019-06-04 DIAGNOSIS — R419 Unspecified symptoms and signs involving cognitive functions and awareness: Secondary | ICD-10-CM | POA: Diagnosis not present

## 2019-06-07 DIAGNOSIS — Z79899 Other long term (current) drug therapy: Secondary | ICD-10-CM | POA: Diagnosis not present

## 2019-06-07 DIAGNOSIS — G4700 Insomnia, unspecified: Secondary | ICD-10-CM | POA: Diagnosis not present

## 2019-06-07 DIAGNOSIS — R600 Localized edema: Secondary | ICD-10-CM | POA: Diagnosis not present

## 2019-06-07 DIAGNOSIS — E039 Hypothyroidism, unspecified: Secondary | ICD-10-CM | POA: Diagnosis not present

## 2019-06-08 DIAGNOSIS — E039 Hypothyroidism, unspecified: Secondary | ICD-10-CM | POA: Diagnosis not present

## 2019-06-08 DIAGNOSIS — R600 Localized edema: Secondary | ICD-10-CM | POA: Diagnosis not present

## 2019-06-12 ENCOUNTER — Other Ambulatory Visit: Payer: Self-pay | Admitting: Family Medicine

## 2019-06-12 DIAGNOSIS — R600 Localized edema: Secondary | ICD-10-CM

## 2019-06-19 ENCOUNTER — Encounter (HOSPITAL_COMMUNITY): Payer: Self-pay

## 2019-06-19 ENCOUNTER — Emergency Department (HOSPITAL_COMMUNITY): Payer: Medicare Other

## 2019-06-19 ENCOUNTER — Other Ambulatory Visit: Payer: Self-pay

## 2019-06-19 ENCOUNTER — Emergency Department (HOSPITAL_COMMUNITY)
Admission: EM | Admit: 2019-06-19 | Discharge: 2019-06-19 | Disposition: A | Discharge status: Home / Self Care | Payer: Medicare Other | Attending: Emergency Medicine | Admitting: Emergency Medicine

## 2019-06-19 DIAGNOSIS — Z8619 Personal history of other infectious and parasitic diseases: Secondary | ICD-10-CM | POA: Diagnosis not present

## 2019-06-19 DIAGNOSIS — R0602 Shortness of breath: Secondary | ICD-10-CM | POA: Diagnosis not present

## 2019-06-19 DIAGNOSIS — R059 Cough, unspecified: Secondary | ICD-10-CM

## 2019-06-19 DIAGNOSIS — Z7982 Long term (current) use of aspirin: Secondary | ICD-10-CM | POA: Diagnosis not present

## 2019-06-19 DIAGNOSIS — Z87891 Personal history of nicotine dependence: Secondary | ICD-10-CM | POA: Diagnosis not present

## 2019-06-19 DIAGNOSIS — R531 Weakness: Secondary | ICD-10-CM | POA: Insufficient documentation

## 2019-06-19 DIAGNOSIS — Z20828 Contact with and (suspected) exposure to other viral communicable diseases: Secondary | ICD-10-CM | POA: Insufficient documentation

## 2019-06-19 DIAGNOSIS — R609 Edema, unspecified: Secondary | ICD-10-CM | POA: Insufficient documentation

## 2019-06-19 DIAGNOSIS — Z8701 Personal history of pneumonia (recurrent): Secondary | ICD-10-CM | POA: Insufficient documentation

## 2019-06-19 DIAGNOSIS — R05 Cough: Secondary | ICD-10-CM | POA: Diagnosis not present

## 2019-06-19 DIAGNOSIS — Z79899 Other long term (current) drug therapy: Secondary | ICD-10-CM | POA: Insufficient documentation

## 2019-06-19 LAB — COMPREHENSIVE METABOLIC PANEL
ALT: 20 U/L (ref 0–44)
AST: 25 U/L (ref 15–41)
Albumin: 3.3 g/dL — ABNORMAL LOW (ref 3.5–5.0)
Alkaline Phosphatase: 72 U/L (ref 38–126)
Anion gap: 8 (ref 5–15)
BUN: 17 mg/dL (ref 8–23)
CO2: 28 mmol/L (ref 22–32)
Calcium: 8.7 mg/dL — ABNORMAL LOW (ref 8.9–10.3)
Chloride: 108 mmol/L (ref 98–111)
Creatinine, Ser: 1.04 mg/dL (ref 0.61–1.24)
GFR calc Af Amer: >60 mL/min (ref >60)
GFR calc non Af Amer: >60 mL/min (ref >60)
Glucose, Bld: 118 mg/dL — ABNORMAL HIGH (ref 70–99)
Potassium: 4 mmol/L (ref 3.5–5.1)
Sodium: 144 mmol/L (ref 135–145)
Total Bilirubin: 0.5 mg/dL (ref 0.3–1.2)
Total Protein: 6.2 g/dL — ABNORMAL LOW (ref 6.5–8.1)

## 2019-06-19 LAB — BRAIN NATRIURETIC PEPTIDE: B Natriuretic Peptide: 42.2 pg/mL (ref 0.0–100.0)

## 2019-06-19 LAB — CBC WITH DIFFERENTIAL/PLATELET
Abs Immature Granulocytes: 0.04 10*3/uL (ref 0.00–0.07)
Basophils Absolute: 0 10*3/uL (ref 0.0–0.1)
Basophils Relative: 0 %
Eosinophils Absolute: 0.5 10*3/uL (ref 0.0–0.5)
Eosinophils Relative: 6 %
HCT: 39.9 % (ref 39.0–52.0)
Hemoglobin: 13.6 g/dL (ref 13.0–17.0)
Immature Granulocytes: 1 %
Lymphocytes Relative: 17 %
Lymphs Abs: 1.3 10*3/uL (ref 0.7–4.0)
MCH: 32 pg (ref 26.0–34.0)
MCHC: 34.1 g/dL (ref 30.0–36.0)
MCV: 93.9 fL (ref 80.0–100.0)
Monocytes Absolute: 0.6 10*3/uL (ref 0.1–1.0)
Monocytes Relative: 8 %
Neutro Abs: 4.9 10*3/uL (ref 1.7–7.7)
Neutrophils Relative %: 68 %
Platelets: 183 10*3/uL (ref 150–400)
RBC: 4.25 MIL/uL (ref 4.22–5.81)
RDW: 12.3 % (ref 11.5–15.5)
WBC: 7.3 10*3/uL (ref 4.0–10.5)
nRBC: 0 % (ref 0.0–0.2)

## 2019-06-19 LAB — SARS CORONAVIRUS 2 (TAT 6-24 HRS): SARS Coronavirus 2: NEGATIVE

## 2019-06-19 LAB — TROPONIN I (HIGH SENSITIVITY): Troponin I (High Sensitivity): 3 ng/L (ref <18)

## 2019-06-19 MED ORDER — ALBUTEROL SULFATE HFA 108 (90 BASE) MCG/ACT IN AERS
2.0000 | INHALATION_SPRAY | Freq: Four times a day (QID) | RESPIRATORY_TRACT | Status: DC
  Administered 2019-06-19: 2 via RESPIRATORY_TRACT
  Filled 2019-06-19: qty 6.7

## 2019-06-19 MED ORDER — PREDNISONE 20 MG PO TABS
60.0000 mg | ORAL_TABLET | Freq: Once | ORAL | Status: AC
  Administered 2019-06-19: 60 mg via ORAL
  Filled 2019-06-19: qty 3

## 2019-06-19 MED ORDER — PREDNISONE 20 MG PO TABS: 40.0000 mg | ORAL_TABLET | Freq: Every day | ORAL | 0 refills | Status: DC

## 2019-06-19 NOTE — Discharge Instructions (Addendum)
As discussed, today's evaluation has been largely reassuring. You are likely experiencing bronchitis, contributing to your cough. For the next 4 days please use the prescribed steroids, as well as your albuterol for additional relief. A Covid test has been sent as well, the result should be available tomorrow. Return here for concerning changes in your condition.

## 2019-06-19 NOTE — ED Provider Notes (Signed)
[facility] DEPT Provider Note   CSN: [id number] Arrival date & time: 06/19/19  [id number]     History Chief Complaint  Patient presents with  . Cough    [name] is a 68 y.o. male.  HPI    Patient presents with weakness, dyspnea. Patient has a history of reactive airway disease, notes frequent episodes of bronchitis.  He has also had pneumonia several times over the past decade. He presents today due to cough, weakness that has been present for at least 1 week, possibly as long as a month. He did have an infection earlier in the year, but currently has no fever, vomiting, sustained chest pain, though there are some heartburn-like symptoms.  No true chest pain, no exertional chest pain, no chest tightness with exertion.  However, the patient does note that with exertion he is easily fatigued, weak. He does not have a history of cardiac disease, has been evaluated in the past.  He notes some lower extremity edema, not improved with supine positioning, which is unusual for him.   Past Medical History:  Diagnosis Date  . Arthritis   . Bronchitis    history of  . 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 GERD (gastroesophageal reflux disease)   . Hiatal hernia   . 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 Hypercalcemia   . Lipoma    left forearm (3)  . Mitral valve prolapse    does not see cardiologist for. last stress test 2003  . Pneumonia 2009  . Tumor cells, benign    lung, left side  . Unstable angina ([place]) 10/28/2017    Patient Active Problem List   Diagnosis Date Noted  . Sinus tachycardia 04/03/2019  . Confusion 01/02/2019  . Abnormal EKG-dynamic TWI 10/31/2017  . PVC's (premature ventricular contractions) 10/31/2017  . Family history of coronary artery disease in father 10/31/2017  . Unstable angina ([place] [facility]) 10/28/2017  . Bilateral inguinal hernia 11/04/2016  . Mobitz type 1 second degree atrioventricular block 03/16/2016  . Syncope  03/15/2016  . Malnutrition of moderate degree 10/21/2015  . Hyperkinetic disorder   . Choreiform movements 10/20/2015  . Chronic respiratory failure with hypoxia ([place]) 09/14/2015  . Cough   . Acute respiratory distress 09/04/2015  . Barrett's esophagus 09/04/2015  . Dyspnea on exertion 09/04/2015  . Influenza   . CAP (community acquired pneumonia) 09/03/2015  . Upper airway cough syndrome 08/21/2015  . Chest pain with moderate risk of acute coronary syndrome 05/13/2015  . Bilateral inguinal hernia (BIH) 01/08/2014  . Ileus ([facility]) 08/18/2012  . Pain due to Left hip joint prosthesis 06/13/2011  . Hyperparathyroidism, primary, s/p MI parathyroidectomy 7/18, 3.1 gm adenoma 02/19/2011  . LIPOMAS, MULTIPLE 10/24/2007  . HLD (hyperlipidemia) 10/24/2007  . Depression 10/24/2007  . ADD (attention deficit disorder) 10/24/2007  . OTITIS MEDIA 10/24/2007  . SINUSITIS, CHRONIC 10/24/2007  . PNEUMONIA, RECURRENT 10/24/2007  . HIP FRACTURE, LEFT 10/24/2007  . GENITAL HERPES, HX OF 10/24/2007  . TOBACCO USE, QUIT 10/24/2007  . HIP REPLACEMENT, TOTAL, HX OF 10/24/2007  . Other acquired absence of organ 10/24/2007  . G E R D 07/13/2007    Past Surgical History:  Procedure Laterality Date  . APPENDECTOMY  1984  . CATARACT EXTRACTION     L and R eye  . CHOLECYSTECTOMY  2002  . HYDROCELE EXCISION Right 11/04/2016   Procedure: HYDROCELECTOMY ADULT;  Surgeon: Franchot Gallo, MD;  Location: [facility];  Service:  Urology;  Laterality: Right;  . INGUINAL HERNIA REPAIR Bilateral 11/04/2016   Procedure: HERNIA REPAIR INGUINAL ADULT BILATERAL;  Surgeon: Jackolyn Confer, MD;  Location[facility];  Service: General;  Laterality: Bilateral;  . INSERTION OF MESH Bilateral 11/04/2016   Procedure: INSERTION OF MESH;  Surgeon: Jackolyn Confer, MD;  [facility];  Service: General;  Laterality: Bilateral;  . JOINT REPLACEMENT     Lt hip  . LEFT HEART CATH AND CORONARY ANGIOGRAPHY N/A 10/31/2017   Procedure: LEFT  HEART CATH AND CORONARY ANGIOGRAPHY;  Surgeon: Troy Sine, MD;  Location: [facility];  Service: Cardiovascular;  Laterality: N/A;  . MASTOIDECTOMY  1996  . NASAL SEPTUM SURGERY     sinus surgery  . PARATHYROIDECTOMY    . TOTAL HIP REVISION  06/14/2011   Procedure: TOTAL HIP REVISION;  Surgeon: Kerin Salen;  Location: [facility];  Service: Orthopedics;  Laterality: Left;  Left Acetabular  Hip Revision       Family History  Problem Relation Age of Onset  . Lung cancer Father 98       smoked  . Heart Problems Father   . Cancer Brother        prostate  . Heart Problems Mother     Social History   Tobacco Use  . Smoking status: Former Smoker    Packs/day: 1.50    Years: 24.00    Pack years: 36.00    Types: Cigarettes    Quit date: 07/06/1987    Years since quitting: 31.9  . Smokeless tobacco: Former Network engineer Use Topics  . Alcohol use: Yes    Alcohol/week: 1.0 standard drinks    Types: 1 Cans of beer per week  . Drug use: No    Home Medications Prior to Admission medications   Medication Sig Start Date End Date Taking? Authorizing Provider  aspirin 81 MG chewable tablet Chew 81 mg by mouth daily.   Yes [provider]  famotidine (PEPCID) 20 MG tablet Take 1 tablet (20 mg total) by mouth at bedtime. 09/09/15  Yes Reyne Dumas, MD  FLUoxetine (PROZAC) 40 MG capsule Take 40 mg by mouth every morning.    Yes [provider]  guaifenesin (ROBITUSSIN) 100 MG/5ML syrup Take 200 mg by mouth 3 (three) times daily as needed for cough.   Yes [provider]  methylphenidate (RITALIN) 10 MG tablet Take 10 mg by mouth 3 (three) times daily.     Yes [provider]  mirtazapine (REMERON) 15 MG tablet Take 15 mg by mouth at bedtime. 06/07/19  Yes [provider]  Multiple Vitamin (MULTIVITAMIN WITH MINERALS) TABS tablet Take 1 tablet by mouth daily.   Yes [provider]  nitroGLYCERIN (NITROSTAT) 0.4 MG SL tablet   04/30/18  Yes [provider]  omeprazole (PRILOSEC) 20 MG capsule Take 20 mg by mouth 2 (two) times daily before a meal.    Yes [provider]  polyethylene glycol (MIRALAX / GLYCOLAX) packet Take 17 g by mouth daily as needed (for constipation.).    Yes [provider]  rosuvastatin (CRESTOR) 10 MG tablet TAKE 1 TABLET(10 MG) BY MOUTH DAILY Patient taking differently: Take 10 mg by mouth daily.  05/03/19  Yes Nahser, Wonda Cheng, MD  SYNTHROID 25 MCG tablet Take 25 mcg by mouth daily before breakfast.  02/05/19  Yes [provider]  tamsulosin (FLOMAX) 0.4 MG CAPS capsule Take 0.4 mg by mouth daily after breakfast.  Yes [provider]  traZODone (DESYREL) 50 MG tablet Take 50 mg by mouth at bedtime as needed for sleep. 03/19/19  Yes [provider]  metoprolol succinate (TOPROL XL) 25 MG 24 hr tablet Take 1 tablet (25 mg total) by mouth daily. Patient not taking: Reported on 06/19/2019 04/03/19 03/28/20  Nahser, Wonda Cheng, MD  predniSONE (DELTASONE) 20 MG tablet Take 2 tablets (40 mg total) by mouth daily with breakfast. For the next four days 06/19/19   Carmin Muskrat, MD    Allergies    Ciprofloxacin hcl, Bupropion, Ciprofloxacin-dexamethasone, and Gabapentin  Review of Systems   Review of Systems  Constitutional:       Per HPI, otherwise negative  HENT:       Per HPI, otherwise negative  Respiratory:       Per HPI, otherwise negative  Cardiovascular:       Per HPI, otherwise negative  Gastrointestinal: Negative for vomiting.  Endocrine:       Negative aside from HPI  Genitourinary:       Neg aside from HPI   Musculoskeletal:       Per HPI, otherwise negative  Skin: Negative.   Neurological: Positive for weakness. Negative for syncope.    Physical Exam Updated Vital Signs BP 107/69   Pulse 85   Temp 97.7 F (36.5 C) (Oral)   Resp (!) 24   SpO2 97%   Physical Exam Vitals and nursing note reviewed.  Constitutional:       General: He is not in acute distress.    Appearance: He is well-developed.  HENT:     Head: Normocephalic and atraumatic.  Eyes:     Conjunctiva/sclera: Conjunctivae normal.  Cardiovascular:     Rate and Rhythm: Normal rate and regular rhythm.  Pulmonary:     Effort: Pulmonary effort is normal. No respiratory distress.     Breath sounds: No stridor. No wheezing or rhonchi.  Abdominal:     General: There is no distension.  Skin:    General: Skin is warm and dry.  Neurological:     Mental Status: He is alert and oriented to person, place, and time.     ED Results / Procedures / Treatments   Labs (all labs ordered are listed, but only abnormal results are displayed) Labs Reviewed  COMPREHENSIVE METABOLIC PANEL - Abnormal; Notable for the following components:      Result Value   Glucose, Bld 118 (*)    Calcium 8.7 (*)    Total Protein 6.2 (*)    Albumin 3.3 (*)    All other components within normal limits  SARS CORONAVIRUS 2 (TAT 6-24 HRS)  CBC WITH DIFFERENTIAL/PLATELET  BRAIN NATRIURETIC PEPTIDE  TROPONIN I (HIGH SENSITIVITY)    EKG EKG Interpretation  Date/Time:  Tuesday June 19 2019 09:34:48 EST Ventricular Rate:  84 PR Interval:    QRS Duration: 86 QT Interval:  371 QTC Calculation: 439 R Axis:   100 Text Interpretation: Sinus rhythm Right axis deviation Low voltage, precordial leads Baseline wander Abnormal ECG Confirmed by Carmin Muskrat [phone]) on 06/19/2019 10:13:29 AM   Radiology DG Chest Port 1 View  Result Date: 06/19/2019 CLINICAL DATA:  Pt presents with c/o cough for the past 6 days. Pt reports he had Covid earlier this year but has a problem with a cough like he has right now, chronic bronchitis. Hx of pneumonia, COVID19. EXAM: PORTABLE CHEST 1 VIEW COMPARISON:  06/29/2018. FINDINGS: Cardiac silhouette is normal in size. No  mediastinal or hilar masses. No evidence of adenopathy. Prominent bronchovascular markings, stable. Stable scarring at  the lung apices, greater on the right. No evidence of pneumonia or pulmonary edema. No pleural effusion or pneumothorax. Skeletal structures are grossly intact. IMPRESSION: No active disease. Electronically Signed   By: Lajean Manes M.D.   On: 06/19/2019 09:10    Procedures Procedures (including critical care time)  Medications Ordered in ED Medications  albuterol (VENTOLIN HFA) 108 (90 Base) MCG/ACT inhaler 2 puff (has no administration in time range)  predniSONE (DELTASONE) tablet 60 mg (has no administration in time range)    ED Course  I have reviewed the triage vital signs and the nursing notes.  Pertinent labs & imaging results that were available during my care of the patient were reviewed by me and considered in my medical decision making (see chart for details).    MDM Rules/Calculators/A&P                      10:45 AM Patient is awake, alert, in no distress, hemodynamically unremarkable, no hypoxia. Findings reviewed, discussed with him, no evidence for pneumonia, potential lab abnormalities, no evidence of heart failure, ACS, with reassuring EKG, normal troponin. Some suspicion for bronchitis, though Covid also considered. Patient appropriate for discharge after initiation of albuterol, steroids.   MELINDA CUNY was evaluated in Emergency Department on 06/19/2019 for the symptoms described in the history of present illness. He was evaluated in the context of the global COVID-19 pandemic, which necessitated consideration that the patient might be at risk for infection with the SARS-CoV-2 virus that causes COVID-19. Institutional protocols and algorithms that pertain to the evaluation of patients at risk for COVID-19 are in a state of rapid change based on information released by regulatory bodies including the CDC and federal and state organizations. These policies and algorithms were followed during the patient's care in the ED.  Final Clinical Impression(s) / ED  Diagnoses Final diagnoses:  Cough    Rx / DC Orders ED Discharge Orders         Ordered    predniSONE (DELTASONE) 20 MG tablet  Daily with breakfast     06/19/19 1043           Carmin Muskrat, MD 06/19/19 1046

## 2019-06-19 NOTE — ED Triage Notes (Signed)
Pt presents with c/o cough for the past 6 days. Pt reports he had Covid earlier this year but has a problem with a cough like he has right now, chronic bronchitis.

## 2019-07-13 ENCOUNTER — Other Ambulatory Visit: Payer: Self-pay | Admitting: Family Medicine

## 2019-07-13 ENCOUNTER — Ambulatory Visit
Admission: RE | Admit: 2019-07-13 | Discharge: 2019-07-13 | Disposition: A | Discharge status: Home / Self Care | Payer: Medicare Other | Source: Ambulatory Visit | Attending: Family Medicine | Admitting: Family Medicine

## 2019-07-13 ENCOUNTER — Other Ambulatory Visit: Payer: Self-pay

## 2019-07-13 DIAGNOSIS — R05 Cough: Secondary | ICD-10-CM

## 2019-07-13 DIAGNOSIS — R053 Chronic cough: Secondary | ICD-10-CM

## 2019-07-13 MED ORDER — IOPAMIDOL (ISOVUE-300) INJECTION 61%
75.0000 mL | Freq: Once | INTRAVENOUS | Status: AC | PRN
  Administered 2019-07-13: 75 mL via INTRAVENOUS

## 2019-07-16 ENCOUNTER — Encounter: Payer: Self-pay | Admitting: Primary Care

## 2019-07-16 ENCOUNTER — Ambulatory Visit (INDEPENDENT_AMBULATORY_CARE_PROVIDER_SITE_OTHER): Payer: Medicare Other | Admitting: Primary Care

## 2019-07-16 ENCOUNTER — Other Ambulatory Visit: Payer: Self-pay

## 2019-07-16 DIAGNOSIS — R059 Cough, unspecified: Secondary | ICD-10-CM

## 2019-07-16 DIAGNOSIS — R911 Solitary pulmonary nodule: Secondary | ICD-10-CM

## 2019-07-16 DIAGNOSIS — R918 Other nonspecific abnormal finding of lung field: Secondary | ICD-10-CM

## 2019-07-16 DIAGNOSIS — R05 Cough: Secondary | ICD-10-CM

## 2019-07-16 MED ORDER — TRAMADOL HCL 50 MG PO TABS: ORAL_TABLET | ORAL | 0 refills | Status: DC

## 2019-07-16 NOTE — Patient Instructions (Addendum)
  Recommendations: - Start Flonase 1 spray per nostril once daily (take at bedtime) - Increase omeprazole 40mg  twice daily for 2 weeks  - Take delsym 2 tsp twice daily  - Check with neurologist if Singulair 10mg  at bedtime ok to take (if ok will send in RX)  The key to effective treatment for your cough is eliminating the non-stop cycle of cough   Once you have eliminated the cough for 3 straight days try reducing Tramdol and then delsym   Rx: - Tramadol 50mg  - take 1/2-1 tab every 6 hours for cough suppression   Orders: - Sputum x3  - Labs (HP panel, CBC with diff, ACE, CRP, RF) - CT chest wo contrast 6 months re: pulmonary nodule <1cm   Follow-up: - 2 week FU with Dr. Melvyn Novas or Eustaquio Maize NP

## 2019-07-16 NOTE — Progress Notes (Signed)
Virtual Visit via Telephone Note  I connected with [name] on 07/16/19 at 10:00 AM EST by telephone and verified that I am speaking with the correct person using two identifiers.  Location: Patient: Home Provider: Office   I discussed the limitations, risks, security and privacy concerns of performing an evaluation and management service by telephone and the availability of in person appointments. I also discussed with the patient that there may be a patient responsible charge related to this service. The patient expressed understanding and agreed to proceed.   History of Present Illness: 69 year old male, former smoker quit in 1989. PMH significant for chronic sinusitis, upper airway cough syndrome, recurrent pneumonia, chronic respiratory failure, GERD, barrett's esophagus, unstable angina, mobitz 1 second degree AV block, hyperparathyroidism. Patient of Dr. Melvyn Novas, last seen on 09/13/2016. He has been treated for UACS in the past with GERD rx, tylenol #3 and gabapentin.    Seen in ED on 06/19/19 for bronchitis symptoms. Covid negative. Given albuterol and prednisone. CT chest on 07/13/19 showed mild ground-glass opacity of the bilateral upper lobes and lesser degree the lower lobes, which was present on comparison CT of 2017. Discharged with 5 day course oral prednisone.   07/16/2019 Patient presents today for follow-up. He has not been seen in over 2 years. Reports that he has had a dry cough for 1 month. Occasional clear mucus. Cough is worse in evening and wakes him up at nights. He does report significant history of GERD. Currently taking omeprazole 20mg  twice daily and Pepcid. He has three episode of coughing fits/spams day. He noticed pink tinge sputum on two occasions but no bright red blood. He takes asa 81 mg daily. He was taking robitussin dm. Reports no noticeable improvement with prednisone, tessalon, albuterol.   Observations/Objective:  - No shortness of breath noted but  patient was coughing continuously throughout conversation   Labs: 09/05/15- RF  Elevated 09/05/15- scleroderma neg 09/04/15- strep pneumoniae negative  Imaging: 07/13/19 CT chest w contrast- mild ground-glass opacity of the bilateral upper lobes and lesser in lower lobes. Several pulmonary nodules, with the largest in LUL having slightly enlarged from 2017, now just over 5 mm. Broad differential diagnosis, associated with atypical infections/viral infections as well as inflammatory disease process such as interstitial pneumonia/pneumonitis, hypersensitivity pneumonitis, sarcoidosis, and others.   Cardiac: 01/10/19 Echocardiogram- EF 55-60%. Impaired LV relaxation, indeterminate filling pressure. Normal RV function, systolic pressure unable to be assessed.   Assessment and Plan:  Chronic cough/upper airway-cough: - Dry cough x 1 month with associated sob - No improvement with prednisone/tessalone or albuterol prn - Start Flonase 1 spray per nostril once daily (take at bedtime) - Increase omeprazole 40mg  twice daily for 2 weeks  - Take delsym 2 tsp twice daily and Tamadol 25-50mg  every 6 hours for cough suppression  - Order sputum x3 and labs (HP panel, CBC with diff, ACE, CRP, RF) - CT chest wo contrast 6 months re: pulmonary nodule <1cm   Abnormal CT chest -  CT chest on 07/13/19 showed mild ground-glass opacity of the bilateral upper lobes and lower lobes. Present on previous studies in 2017 but more conspicuous. Broad differential. Several pulmonary nodules, with the largest in LUL having slightly enlarged from 2017, now just over 5 mm    Follow Up Instructions:  - 2 week FU with Dr. Melvyn Novas or Eustaquio Maize NP   I discussed the assessment and treatment plan with the patient. The patient was provided an opportunity to ask questions  and all were answered. The patient agreed with the plan and demonstrated an understanding of the instructions.   The patient was advised to call back or seek an in-person  evaluation if the symptoms worsen or if the condition fails to improve as anticipated.  I provided 30 minutes of non-face-to-face time during this encounter.   Martyn Ehrich, NP

## 2019-07-17 ENCOUNTER — Other Ambulatory Visit (INDEPENDENT_AMBULATORY_CARE_PROVIDER_SITE_OTHER): Payer: Medicare Other

## 2019-07-17 DIAGNOSIS — R05 Cough: Secondary | ICD-10-CM | POA: Diagnosis not present

## 2019-07-17 DIAGNOSIS — R059 Cough, unspecified: Secondary | ICD-10-CM

## 2019-07-17 LAB — CBC WITH DIFFERENTIAL/PLATELET
Basophils Absolute: 0.1 10*3/uL (ref 0.0–0.1)
Basophils Relative: 0.8 % (ref 0.0–3.0)
Eosinophils Absolute: 0.4 10*3/uL (ref 0.0–0.7)
Eosinophils Relative: 4.9 % (ref 0.0–5.0)
HCT: 41.4 % (ref 39.0–52.0)
Hemoglobin: 13.8 g/dL (ref 13.0–17.0)
Lymphocytes Relative: 20 % (ref 12.0–46.0)
Lymphs Abs: 1.6 10*3/uL (ref 0.7–4.0)
MCHC: 33.2 g/dL (ref 30.0–36.0)
MCV: 93.7 fl (ref 78.0–100.0)
Monocytes Absolute: 0.6 10*3/uL (ref 0.1–1.0)
Monocytes Relative: 7.7 % (ref 3.0–12.0)
Neutro Abs: 5.5 10*3/uL (ref 1.4–7.7)
Neutrophils Relative %: 66.6 % (ref 43.0–77.0)
Platelets: 216 10*3/uL (ref 150.0–400.0)
RBC: 4.42 Mil/uL (ref 4.22–5.81)
RDW: 12.7 % (ref 11.5–15.5)
WBC: 8.2 10*3/uL (ref 4.0–10.5)

## 2019-07-17 LAB — C-REACTIVE PROTEIN: CRP: 3.4 mg/dL (ref 0.5–20.0)

## 2019-07-18 LAB — ANGIOTENSIN CONVERTING ENZYME: Angiotensin-Converting Enzyme: 43 U/L (ref 9–67)

## 2019-07-18 LAB — RHEUMATOID FACTOR: Rheumatoid fact SerPl-aCnc: 575 IU/mL — ABNORMAL HIGH (ref <14)

## 2019-07-20 ENCOUNTER — Other Ambulatory Visit: Payer: Medicare Other

## 2019-07-20 ENCOUNTER — Telehealth: Payer: Self-pay | Admitting: Primary Care

## 2019-07-20 DIAGNOSIS — E785 Hyperlipidemia, unspecified: Secondary | ICD-10-CM | POA: Diagnosis not present

## 2019-07-20 DIAGNOSIS — R059 Cough, unspecified: Secondary | ICD-10-CM

## 2019-07-20 DIAGNOSIS — R05 Cough: Secondary | ICD-10-CM

## 2019-07-20 DIAGNOSIS — F330 Major depressive disorder, recurrent, mild: Secondary | ICD-10-CM | POA: Diagnosis not present

## 2019-07-20 DIAGNOSIS — I2510 Atherosclerotic heart disease of native coronary artery without angina pectoris: Secondary | ICD-10-CM | POA: Diagnosis not present

## 2019-07-20 DIAGNOSIS — E039 Hypothyroidism, unspecified: Secondary | ICD-10-CM | POA: Diagnosis not present

## 2019-07-20 DIAGNOSIS — C439 Malignant melanoma of skin, unspecified: Secondary | ICD-10-CM | POA: Diagnosis not present

## 2019-07-20 DIAGNOSIS — Z0000 Encounter for general adult medical examination without abnormal findings: Secondary | ICD-10-CM | POA: Diagnosis not present

## 2019-07-20 DIAGNOSIS — J449 Chronic obstructive pulmonary disease, unspecified: Secondary | ICD-10-CM | POA: Diagnosis not present

## 2019-07-20 DIAGNOSIS — I341 Nonrheumatic mitral (valve) prolapse: Secondary | ICD-10-CM | POA: Diagnosis not present

## 2019-07-20 DIAGNOSIS — Z79899 Other long term (current) drug therapy: Secondary | ICD-10-CM | POA: Diagnosis not present

## 2019-07-20 DIAGNOSIS — E210 Primary hyperparathyroidism: Secondary | ICD-10-CM | POA: Diagnosis not present

## 2019-07-20 DIAGNOSIS — R7303 Prediabetes: Secondary | ICD-10-CM | POA: Diagnosis not present

## 2019-07-20 DIAGNOSIS — I959 Hypotension, unspecified: Secondary | ICD-10-CM | POA: Diagnosis not present

## 2019-07-20 NOTE — Telephone Encounter (Signed)
Rx: - Tramadol 50mg  - take 1/2-1 tab every 6 hours for cough suppression   Called and spoke with pt stating to him the instructions from the Rx that was sent in after pt's last OV and he verbalized understanding. Also stated to pt that Beth said the labwork will be further discussed at upcoming [place] and he verbalized understanding. Nothing further needed.

## 2019-07-23 ENCOUNTER — Telehealth: Payer: Self-pay | Admitting: Primary Care

## 2019-07-23 LAB — HYPERSENSITIVITY PNEUMONITIS
A. Pullulans Abs: POSITIVE — AB
A.Fumigatus #1 Abs: NEGATIVE
Micropolyspora faeni, IgG: NEGATIVE
Pigeon Serum Abs: NEGATIVE
Thermoact. Saccharii: NEGATIVE
Thermoactinomyces vulgaris, IgG: NEGATIVE

## 2019-07-23 NOTE — Telephone Encounter (Signed)
Error

## 2019-07-27 DIAGNOSIS — E785 Hyperlipidemia, unspecified: Secondary | ICD-10-CM | POA: Diagnosis not present

## 2019-07-27 DIAGNOSIS — Z0000 Encounter for general adult medical examination without abnormal findings: Secondary | ICD-10-CM | POA: Diagnosis not present

## 2019-07-27 DIAGNOSIS — Z79899 Other long term (current) drug therapy: Secondary | ICD-10-CM | POA: Diagnosis not present

## 2019-07-27 DIAGNOSIS — R7303 Prediabetes: Secondary | ICD-10-CM | POA: Diagnosis not present

## 2019-07-27 DIAGNOSIS — E039 Hypothyroidism, unspecified: Secondary | ICD-10-CM | POA: Diagnosis not present

## 2019-07-27 DIAGNOSIS — Z125 Encounter for screening for malignant neoplasm of prostate: Secondary | ICD-10-CM | POA: Diagnosis not present

## 2019-07-27 NOTE — Progress Notes (Signed)
Will review sputum and labs at visit next week.

## 2019-07-30 ENCOUNTER — Other Ambulatory Visit: Payer: Self-pay

## 2019-07-30 ENCOUNTER — Ambulatory Visit (INDEPENDENT_AMBULATORY_CARE_PROVIDER_SITE_OTHER): Payer: Medicare Other | Admitting: Primary Care

## 2019-07-30 ENCOUNTER — Encounter: Payer: Self-pay | Admitting: Primary Care

## 2019-07-30 VITALS — BP 108/60 | HR 83 | Temp 97.7°F | Ht 72.0 in | Wt 170.6 lb

## 2019-07-30 DIAGNOSIS — J849 Interstitial pulmonary disease, unspecified: Secondary | ICD-10-CM

## 2019-07-30 DIAGNOSIS — R768 Other specified abnormal immunological findings in serum: Secondary | ICD-10-CM | POA: Diagnosis not present

## 2019-07-30 DIAGNOSIS — B370 Candidal stomatitis: Secondary | ICD-10-CM | POA: Diagnosis not present

## 2019-07-30 DIAGNOSIS — R9389 Abnormal findings on diagnostic imaging of other specified body structures: Secondary | ICD-10-CM

## 2019-07-30 DIAGNOSIS — R059 Cough, unspecified: Secondary | ICD-10-CM

## 2019-07-30 DIAGNOSIS — R05 Cough: Secondary | ICD-10-CM | POA: Diagnosis not present

## 2019-07-30 DIAGNOSIS — K2270 Barrett's esophagus without dysplasia: Secondary | ICD-10-CM | POA: Diagnosis not present

## 2019-07-30 MED ORDER — BREO ELLIPTA 100-25 MCG/INH IN AEPB: 1.0000 | INHALATION_SPRAY | Freq: Every day | RESPIRATORY_TRACT | 0 refills | Status: DC

## 2019-07-30 MED ORDER — FLUCONAZOLE 100 MG PO TABS: 100.0000 mg | ORAL_TABLET | Freq: Every day | ORAL | 0 refills | Status: DC

## 2019-07-30 MED ORDER — PREDNISONE 20 MG PO TABS: ORAL_TABLET | ORAL | 0 refills | Status: DC

## 2019-07-30 NOTE — Assessment & Plan Note (Signed)
-   Dry cough x 2 months with associated sob/fatigue  - Possibly related to underlying RA and/or hypersensitivity pneumonitis  - Needs HRCT and repeat PFTs - RX prednisone 40mg  x 1 week; 20mg  x 1 week; stop  - Trial Breo 100 one puff once daily

## 2019-07-30 NOTE — Progress Notes (Signed)
@Patient  ID: [name], male    DOB: 17-Jan-1951, 69 y.o.   MRN: [id number]  Chief Complaint  Patient presents with  . Follow-up    C/o more weakness for 1 wk now,not any better,increased sob,cough dry,no fcs    Referring provider: Jonathon Jordan, MD  HPI: 69 year old male, former smoker quit in 1989. PMH significant for chronic sinusitis, upper airway cough syndrome, recurrent pneumonia, chronic respiratory failure, GERD, barrett's esophagus, unstable angina, mobitz 1 second degree AV block, hyperparathyroidism. Patient of Dr. Melvyn Novas, last seen on 09/13/2016. He has been treated for UACS in the past with GERD rx, tylenol #3 and gabapentin.    Seen in ED on 06/19/19 for bronchitis symptoms. Covid negative. Given albuterol and prednisone. CT chest on 07/13/19 showed mild ground-glass opacity of the bilateral upper lobes and lesser degree the lower lobes, which was present on comparison CT of 2017, more prominent now. Discharged with 5 day course oral prednisone.   07/16/2019 Patient presents today for follow-up. He has not been seen in over 2 years. Reports that he has had a dry cough for 1 month. Occasional clear mucus. Cough is worse in evening and wakes him up at nights. He does report significant history of GERD. Currently taking omeprazole 20mg  twice daily and Pepcid. He has three episode of coughing fits/spams day. He noticed pink tinge sputum on two occasions but no bright red blood. He takes asa 81 mg daily. He was taking robitussin dm. Reports no noticeable improvement with prednisone, tessalon, albuterol.   07/30/2019 Patient presents today for 2 week follow-up. Reports persistent dry cough for 2 months. He has noticed some shortness of breath over last several months but particularly worse the last week or two. Associated fatigue and rare wheezing with coughing fits. Hears craking in lungs when breathing. He has seen no improvement with medications. He able to do all ADLs. Sputum  sample showed oropharyngeal contamination. It did, however, show did budding yeast (likely oralpharyngeal). 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 Patient reports that they found black mold in bedroom humidifier. Given patient ILD questionnaire today.    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 No exposure to birds, feather/down bedding or hot tub    Allergies  Allergen Reactions  . Ciprofloxacin Hcl Other (See Comments)    Burning in ears when drops were used  . Bupropion Other (See Comments)    Caused altered mental status Altered mental status Caused altered mental status  . Ciprofloxacin-Dexamethasone Other (See Comments)    Burning in ears when Cipro ear drops were used unknown Burning in ears when Cipro ear drops were used  . Gabapentin Other (See Comments)    Caused weakness, dizziness, and forgetfulness  Weakness and dizziness with higher doses. Weakness and dizziness Caused weakness, dizziness, and forgetfulness    Immunization History  Administered Date(s) Administered  . Influenza Split 04/05/2015, 03/30/2019  . Influenza, High Dose Seasonal PF 03/30/2016  . Pneumococcal Polysaccharide-23 10/24/2007    Past Medical History:  Diagnosis Date  . Arthritis   . Bronchitis    history of  . 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 GERD (gastroesophageal reflux disease)   . Hiatal hernia   . 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 Hypercalcemia   . Lipoma    left forearm (3)  . Mitral valve prolapse    does not see  cardiologist for. last stress test 2003  . Pneumonia 2009  . Tumor cells, benign    lung, left side  . Unstable angina ([place]) 10/28/2017    Tobacco History: Social History   Tobacco Use  Smoking Status Former Smoker  . Packs/day: 1.50  . Years: 24.00  . Pack years: 36.00  . Types: Cigarettes  . Quit date: 07/06/1987  .  Years since quitting: 32.0  Smokeless Tobacco Former Air traffic controller given: Not Answered   Outpatient Medications Prior to Visit  Medication Sig Dispense Refill  . aspirin 81 MG chewable tablet Chew 81 mg by mouth daily.    . famotidine (PEPCID) 20 MG tablet Take 1 tablet (20 mg total) by mouth at bedtime. 30 tablet 3  . FLUoxetine (PROZAC) 40 MG capsule Take 40 mg by mouth every morning.     Marland Kitchen guaifenesin (ROBITUSSIN) 100 MG/5ML syrup Take 200 mg by mouth 3 (three) times daily as needed for cough.    . methylphenidate (RITALIN) 10 MG tablet Take 10 mg by mouth 3 (three) times daily.      . metoprolol succinate (TOPROL XL) 25 MG 24 hr tablet Take 1 tablet (25 mg total) by mouth daily. 90 tablet 3  . mirtazapine (REMERON) 15 MG tablet Take 15 mg by mouth at bedtime.    . Multiple Vitamin (MULTIVITAMIN WITH MINERALS) TABS tablet Take 1 tablet by mouth daily.    . nitroGLYCERIN (NITROSTAT) 0.4 MG SL tablet     . omeprazole (PRILOSEC) 20 MG capsule Take 20 mg by mouth 2 (two) times daily before a meal.     . polyethylene glycol (MIRALAX / GLYCOLAX) packet Take 17 g by mouth daily as needed (for constipation.).     Marland Kitchen rosuvastatin (CRESTOR) 10 MG tablet TAKE 1 TABLET(10 MG) BY MOUTH DAILY (Patient taking differently: Take 10 mg by mouth daily. ) 90 tablet 3  . SYNTHROID 25 MCG tablet Take 25 mcg by mouth daily before breakfast.     . tamsulosin (FLOMAX) 0.4 MG CAPS capsule Take 0.4 mg by mouth daily after breakfast.     . traZODone (DESYREL) 50 MG tablet Take 50 mg by mouth at bedtime as needed for sleep.    Marland Kitchen albuterol (VENTOLIN HFA) 108 (90 Base) MCG/ACT inhaler SMARTSIG:2 Puff(s) By Mouth Every 4 Hours PRN    . benzonatate (TESSALON) 200 MG capsule Take 200 mg by mouth 3 (three) times daily as needed.    . traMADol (ULTRAM) 50 MG tablet 1/2-1 tab every 6 hours for cough suppression (Patient not taking: Reported on 07/30/2019) 28 tablet 0   No facility-administered medications prior to visit.    Review of Systems  Review of Systems  Constitutional: Positive for fatigue.  Respiratory: Positive for cough, shortness of breath and wheezing. Negative for chest tightness.   Cardiovascular: Negative.    Physical Exam  BP 108/60 (BP Location: Left Arm, Cuff Size: Normal)   Pulse 83   Temp 97.7 F (36.5 C) (Temporal)   Ht 6' (1.829 m)   Wt 170 lb 9.6 oz (77.4 kg)   SpO2 95%   BMI 23.14 kg/m  Physical Exam Constitutional:      General: He is not in acute distress.    Appearance: Normal appearance. He is not ill-appearing.  HENT:     Head: Normocephalic and atraumatic.     Mouth/Throat:     Comments: Deferred d/t masking Cardiovascular:     Rate and Rhythm: Normal rate and regular rhythm.  Pulmonary:     Effort: No respiratory distress.     Breath sounds: No stridor. Rales present. No wheezing or rhonchi.     Comments: Faint crackles bilateral bases. Upper airway coughing.  Skin:    General: Skin is warm and dry.  Neurological:     General: No focal deficit present.     Mental Status: He is alert and oriented to person, place, and time. Mental status is at baseline.  Psychiatric:        Mood and Affect: Mood normal.        Behavior: Behavior normal.        Thought Content: Thought content normal.        Judgment: Judgment normal.      Lab Results:  CBC    Component Value Date/Time   WBC 8.2 07/17/2019 1421   RBC 4.42 07/17/2019 1421   HGB 13.8 07/17/2019 1421   HCT 41.4 07/17/2019 1421   PLT 216.0 07/17/2019 1421   MCV 93.7 07/17/2019 1421   MCH 32.0 06/19/2019 0902   MCHC 33.2 07/17/2019 1421   RDW 12.7 07/17/2019 1421   LYMPHSABS 1.6 07/17/2019 1421   MONOABS 0.6 07/17/2019 1421   EOSABS 0.4 07/17/2019 1421   BASOSABS 0.1 07/17/2019 1421    BMET    Component Value Date/Time   NA 144 06/19/2019 0902   NA 142 02/13/2018 0851   K 4.0 06/19/2019 0902   CL 108 06/19/2019 0902   CO2 28 06/19/2019 0902   GLUCOSE 118 (H) 06/19/2019 0902   BUN 17  06/19/2019 0902   BUN 16 02/13/2018 0851   CREATININE 1.04 06/19/2019 0902   CALCIUM 8.7 (L) 06/19/2019 0902   CALCIUM 9.0 02/19/2011 1233   GFRNONAA >60 06/19/2019 0902   GFRAA >60 06/19/2019 0902    BNP    Component Value Date/Time   BNP 42.2 06/19/2019 0902    ProBNP No results found for: PROBNP  Imaging: CT CHEST W CONTRAST  Result Date: 07/13/2019 CLINICAL DATA:  69 year old male with a history chronic cough EXAM: CT CHEST WITH CONTRAST TECHNIQUE: Multidetector CT imaging of the chest was performed during intravenous contrast administration. CONTRAST:  49mL ISOVUE-300 IOPAMIDOL (ISOVUE-300) INJECTION 61% COMPARISON:  03/16/2016, 12/29/2015 FINDINGS: Cardiovascular: Heart size within normal limits. No pericardial fluid/thickening. Calcifications of the left anterior descending coronary artery, circumflex, right coronary arteries. No significant aortic valve calcifications. Unremarkable course caliber contour of the thoracic aorta. Three vessel arch. Branch vessels remain patent. Developing atherosclerosis in the proximal right subclavian artery with no evidence of high-grade stenosis. Unremarkable diameter of the main pulmonary artery. Mediastinum/Nodes: Multiple small mediastinal lymph nodes throughout all nodal stations. The index node in the right hilar region measures 8 mm. Unremarkable thoracic inlet. Unremarkable thoracic esophagus. Hiatal hernia, including herniation of associated mesenteric fat. Lungs/Pleura: Persisting mild pleuroparenchymal scarring at the lung apices. There are regions of ground-glass opacities involving the bilateral upper lobes, and mild reticular opacities in the dependent aspects of the superior segments of the bilateral lower lobes. The ground-glass opacities were present on the comparison CT of 12/29/2015, though more conspicuous on today's study. Small nodule at the posterior right upper lobe measuring 3 mm is unchanged since the comparison. Small nodule at  the medial left upper lobe measures 3 mm, unchanged. Both of these likely represent granulomas/scarring. 5 mm nodule at the periphery of the left lower lobe on image 95 of series 5. This was present previously though slightly enlarged. Mild bronchiectasis. Upper Abdomen: No acute  finding of the upper abdomen. Cholecystectomy. Musculoskeletal: Multilevel degenerative changes. No acute displaced fracture. IMPRESSION: There is a pattern of mild ground-glass opacity of the bilateral upper lobes and to a lesser degree the lower lobes, which was present on comparison CT of 2017, though more conspicuous on today's exam. This is a nonspecific finding, and has a broad differential diagnosis, associated with atypical infections/viral infections as well as inflammatory disease process such as interstitial pneumonia/pneumonitis, hypersensitivity pneumonitis, sarcoidosis, and others. Referral for pulmonary evaluation may be useful. There are several pulmonary nodules, with the largest in the lateral left lower lobe having slightly enlarged from 2017, now just over 5 mm. Non-contrast chest CT at 6-12 months is recommended. If the nodule is stable at time of repeat CT, then future CT at 18-24 months (from today's scan) is considered optional for low-risk patients, but is recommended for high-risk patients. This recommendation follows the consensus statement: Guidelines for Management of Incidental Pulmonary Nodules Detected on CT Images: From the Fleischner Society 2017; Radiology 2017; 284:228-243. Coronary artery disease. Hiatal hernia. Electronically Signed   By: Corrie Mckusick D.O.   On: 07/13/2019 11:01     Assessment & Plan:   Cough - Dry cough x 2 months with associated sob/fatigue  - Possibly related to underlying RA and/or hypersensitivity pneumonitis  - Needs HRCT and repeat PFTs - RX prednisone 40mg  x 1 week; 20mg  x 1 week; stop  - Trial Breo 100 one puff once daily   Abnormal CT of the chest - 07/13/19 CT  chest w/contrast showed mild GGO of the bilateral upper lobes and lesser degree the lower lobes, which was present on comparison CT of 2017 but now more conspicuous  - HSP panel positive for A. Pullulans; RF 575; sputum contaminated but showed budding yeast - Needs HRCT, PFTs with DLCO, ILD questionnaire, refer rheum/ILD clinic   Elevated rheumatoid factor - Rheumatoid factor 575  - Abnormal CT chest with bilateral GGO  - Refer to rheumatology   Oral candidiasis - Sputum showed moderate budding yeast - RX fluconazole 100mg  once daily x 7 days  Barrett's esophagus - Continue omeprazole 20mg  twice daily and Tom Bean, NP 07/30/2019

## 2019-07-30 NOTE — Assessment & Plan Note (Signed)
-   Sputum showed moderate budding yeast - RX fluconazole 100mg  once daily x 7 days

## 2019-07-30 NOTE — Assessment & Plan Note (Deleted)
-   Rheumatoid factor 575  - Abnormal CT chest with bilateral GGO  - Refer to rheumatology

## 2019-07-30 NOTE — Assessment & Plan Note (Deleted)
-   Reports dry cough with associated sob/fatigue x 2 months  - 07/13/19 CT chest w/contrast showed mild GGO of the bilateral upper lobes and lesser degree the lower lobes, which was present on comparison CT of 2017 but now more conspicuous  - HSP panel positive for A. Pullulans; RF 575; sputum contaminated but showed budding yeast - Needs Repeat PFTs with DLCO, ILD questionnaire, refer rheum/ILD clinic

## 2019-07-30 NOTE — Assessment & Plan Note (Signed)
-   07/13/19 CT chest w/contrast showed mild GGO of the bilateral upper lobes and lesser degree the lower lobes, which was present on comparison CT of 2017 but now more conspicuous  - HSP panel positive for A. Pullulans; RF 575; sputum contaminated but showed budding yeast - Needs HRCT, PFTs with DLCO, ILD questionnaire, refer rheum/ILD clinic

## 2019-07-30 NOTE — Assessment & Plan Note (Signed)
-   Rheumatoid factor 575  - Abnormal CT chest with bilateral GGO  - Refer to rheumatology

## 2019-07-30 NOTE — Assessment & Plan Note (Signed)
-   Continue omeprazole 20mg  twice daily and Pepcid

## 2019-07-30 NOTE — Patient Instructions (Addendum)
Testing results: - Sputum sample showed more squamous epithelial cells is unacceptable for culture due to oropharyngeal contamination. 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 Melvyn Novas or Eustaquio Maize NP in 4-6 weeks  Or sooner if needed   Bronchiectasis  Bronchiectasis is a condition in which the airways in the lungs (bronchi) are damaged and widened. The condition makes it hard for the lungs to get rid of mucus, and it causes mucus to gather in the bronchi. This condition often leads to lung infections, which can make the condition worse. What are the causes? You can be born with this condition or you can develop it later in life. Common causes of this condition include:  Cystic fibrosis.  Repeated lung infections, such as pneumonia or tuberculosis.  An object or other blockage in the lungs.  Breathing in fluid, food, or other objects (aspiration).  A problem with the immune system and lung structure that is present at birth (congenital). Sometimes the cause is not known. What are the signs or symptoms? Common symptoms of this condition include:  A daily cough that brings up mucus and lasts for more than 3 weeks.  Lung infections that happen often.  Shortness of breath  and wheezing.  Weakness and fatigue. How is this diagnosed? This condition is diagnosed with tests, such as:  Chest X-rays or CT scans. These are done to check for changes in the lungs.  Breathing tests. These are done to check how well your lungs are working.  A test of a sample of your saliva (sputum culture). This test is done to check for infection.  Blood tests and other tests. These are done to check for related diseases or causes. How is this treated? Treatment for this condition depends on the severity of the illness and its cause. Treatment may include:  Medicines that loosen mucus so it can be coughed up (expectorants).  Medicines that relax the muscles of the bronchi (bronchodilators).  Antibiotic medicines to prevent or treat infection.  Physical therapy to help clear mucus from the lungs. Techniques may include: ? Postural drainage. This is when you sit or lie in certain positions so that mucus can drain by gravity. ? Chest percussion. This involves tapping the chest or back with a cupped hand. ? Chest vibration. For this therapy, a hand or special equipment vibrates your chest and back.  Surgery to remove the affected part of the lung. This may be done in severe cases. Follow these instructions at home: Medicines  Take over-the-counter and prescription medicines only as told by your health care provider.  If you were prescribed an antibiotic medicine, take it as told by your health care provider. Do not stop taking the antibiotic even if you start to feel better.  Avoid taking sedatives and  antihistamines unless your health care provider tells you to take them. These medicines tend to thicken the mucus in the lungs. Managing symptoms  Perform breathing exercises or techniques to clear your lungs as told by your health care provider.  Consider using a cold steam vaporizer or humidifier in your room or home to help loosen secretions.  If you have a cough that  gets worse at night, try sleeping in a semi-upright position. General instructions  Get plenty of rest.  Drink enough fluid to keep your urine clear or pale yellow.  Stay inside when pollution and ozone levels are high.  Stay up to date with vaccinations and immunizations.  Avoid cigarette smoke and other lung irritants.  Do not use any products that contain nicotine or tobacco, such as cigarettes and e-cigarettes. If you need help quitting, ask your health care provider.  Keep all follow-up visits as told by your health care provider. This is important. Contact a health care provider if:  You cough up more sputum than before and the sputum is yellow or green in color.  You have a fever.  You cannot control your cough and are losing sleep. Get help right away if:  You cough up blood.  You have chest pain.  You have increasing shortness of breath.  You have pain that gets worse or is not controlled with medicines.  You have a fever and your symptoms suddenly get worse. Summary  Bronchiectasis is a condition in which the airways in the lungs (bronchi) are damaged and widened. The condition makes it hard for the lungs to get rid of mucus, and it causes mucus to gather in the bronchi.  Treatment usually includes therapy to help clear mucus from the lungs.  Stay up to date with vaccinations and immunizations. This information is not intended to replace advice given to you by your health care provider. Make sure you discuss any questions you have with your health care provider. Document Revised: 06/03/2017 Document Reviewed: 07/26/2016 Elsevier Patient Education  2020 [employer].

## 2019-07-30 NOTE — Assessment & Plan Note (Deleted)
-   Sputum showed moderate budding yeast - RX fluconazole 100mg  once daily x 7 days

## 2019-07-31 ENCOUNTER — Ambulatory Visit: Payer: Medicare Other | Admitting: Internal Medicine

## 2019-08-01 ENCOUNTER — Other Ambulatory Visit: Payer: Medicare Other

## 2019-08-01 ENCOUNTER — Other Ambulatory Visit: Payer: Self-pay | Admitting: *Deleted

## 2019-08-01 DIAGNOSIS — J849 Interstitial pulmonary disease, unspecified: Secondary | ICD-10-CM | POA: Diagnosis not present

## 2019-08-01 DIAGNOSIS — R059 Cough, unspecified: Secondary | ICD-10-CM

## 2019-08-01 DIAGNOSIS — R05 Cough: Secondary | ICD-10-CM | POA: Diagnosis not present

## 2019-08-01 NOTE — Addendum Note (Signed)
Addended by: Suzzanne Cloud E on: 08/01/2019 11:02 AM   Modules accepted: Orders

## 2019-08-03 ENCOUNTER — Ambulatory Visit
Admission: RE | Admit: 2019-08-03 | Discharge: 2019-08-03 | Disposition: A | Discharge status: Home / Self Care | Payer: Medicare Other | Source: Ambulatory Visit | Attending: Family Medicine | Admitting: Family Medicine

## 2019-08-03 DIAGNOSIS — R600 Localized edema: Secondary | ICD-10-CM

## 2019-08-03 DIAGNOSIS — M7989 Other specified soft tissue disorders: Secondary | ICD-10-CM | POA: Diagnosis not present

## 2019-08-04 ENCOUNTER — Telehealth: Payer: Self-pay | Admitting: Pulmonary Disease

## 2019-08-04 MED ORDER — PREDNISONE 20 MG PO TABS: 20.0000 mg | ORAL_TABLET | Freq: Every day | ORAL | 0 refills | Status: DC

## 2019-08-04 NOTE — Telephone Encounter (Signed)
Seen recently by Geraldo Pitter.  Started on prednisone taper.  Only received 20 pills from pharmacy, but would need total of 15 pills to complete taper.  Order sent to listed pharmacy for prednisone 20 mg pills, #15 with no refills.

## 2019-08-06 ENCOUNTER — Telehealth: Payer: Self-pay | Admitting: Pulmonary Disease

## 2019-08-06 ENCOUNTER — Ambulatory Visit: Payer: Medicare Other

## 2019-08-06 ENCOUNTER — Ambulatory Visit: Payer: Self-pay | Admitting: Neurology

## 2019-08-06 NOTE — Telephone Encounter (Signed)
Spoke with pt, requesting the status of his rheumatology referral.  I advised pt that per referral notes their office tried to call him Thursday to set up appt.  Gave pt the number to Dr. Arlean Hopping office to follow up on.  Nothing further needed at this time- will close encounter.

## 2019-08-06 NOTE — Telephone Encounter (Signed)
thanks

## 2019-08-07 ENCOUNTER — Telehealth: Payer: Self-pay | Admitting: Primary Care

## 2019-08-07 DIAGNOSIS — R591 Generalized enlarged lymph nodes: Secondary | ICD-10-CM | POA: Diagnosis not present

## 2019-08-07 NOTE — Telephone Encounter (Signed)
Patient returned his ILD questionnaire. Will place packet on Beth's computer.

## 2019-08-12 ENCOUNTER — Ambulatory Visit: Payer: Medicare Other | Attending: Internal Medicine

## 2019-08-12 DIAGNOSIS — Z23 Encounter for immunization: Secondary | ICD-10-CM

## 2019-08-12 NOTE — Progress Notes (Signed)
   Covid-19 Vaccination Clinic  Name:  [name]    MRN: [id number] DOB: 05/19/1951  08/12/2019  [name] was observed post Covid-19 immunization for 15 minutes without incidence. He was provided with Vaccine Information Sheet and instruction to access the V-Safe system.   [name] was instructed to call 911 with any severe reactions post vaccine: Marland Kitchen Difficulty breathing  . Swelling of your face and throat  . A fast heartbeat  . A bad rash all over your body  . Dizziness and weakness    Immunizations Administered    Name Date Dose VIS Date Route   Pfizer COVID-19 Vaccine 08/12/2019 11:30 AM 0.3 mL 06/15/2019 Intramuscular   Manufacturer: [place]   Lot: [id number]   [place]: [id number]

## 2019-08-14 ENCOUNTER — Telehealth: Payer: Self-pay | Admitting: Hematology and Oncology

## 2019-08-14 NOTE — Telephone Encounter (Signed)
Received a new hem referral from Dr. Stephanie Acre for LADs noted on [place] and chest CT, elevated WBC from baseline, monocytosis. Pt cld to schedule an appt w/Dr. Lorenso Courier on 2/12 at 2pm. Pt aware to arrive 15 minutes early.

## 2019-08-16 NOTE — Progress Notes (Signed)
[place] Telephone:([phone]) [phone]   Fax:([phone] [place] NOTE  Patient Care Team: Jonathon Jordan, MD as PCP - General (Family Medicine) Nahser, Wonda Cheng, MD as PCP - Cardiology (Cardiology)  Hematological/Oncological History #Lymphadenopathy of the Neck and Groin  #Leukocytosis, resolved 1) 06/29/2018: WBC 10.3, Hgb 14.5, Hct 45.1, MCV 93.1, Plt 200 2) 08/07/2019: labs collected during PCP visit show WBC 13.9, Hgb 14.6, MCV 92.4, Plt 195, ANC 9500, ALC 3300, Monocytes 1000 (nml 300-800) 3) Establish care with Dr. Lorenso Courier   CHIEF COMPLAINTS/PURPOSE OF CONSULTATION:  "Lymphadenopathy of the Neck and Groin "  HISTORY OF PRESENTING ILLNESS:  [name] 69 y.o. male with medical history significant for GERD, dysrhythmia w/ MVP, familial lipomatosis, and bronchitis who presents for evaluation of leukocytosis and lymphadenopathy.   On review of the previous records [name] was seen by his PCP on 08/07/2019 at which time he was noted to have cervical and inguinal lymphadenopathy.  Labs at the time showed a white blood cell count of 13.9, hemoglobin of 14.6, platelets of 633, with neutrophilic predominance with an ANC of 9500.  Previously the patient had a CBC drawn on 06/29/2018 at which time a white blood cell count 10.3 hemoglobin 14.5 and platelets of 200.  Given his leukocytosis and lymphadenopathy the patient was referred to hematology for further evaluation and management.  On exam today [name] notes that he had a "sickness like the flu" that lasted 12 days and ended 1.5 weeks ago. He notes he had an associated cough, but was r/o for COVID. He notes that during that time he had 5 days of diarrhea and possibly lost about 6 lbs total. He reports he first noticed the lymphadenopathy about 6 weeks ago, but he is unsure if they have changed in size since he first noticed them.  He denies having any current fevers, chill, sweats, nausea, or vomiting. He  denies skin rash or abdominal pain.   On further review he does note that he has a family history of cancer with his maternal grandfather having leukemia, paternal grandfather having kidney cancer, colon cancer, and stomach cancer, and his father having lung cancer.  Of note he also did have a Covid infection in April 2020.  A full 10 point ROS was otherwise negative.  MEDICAL HISTORY:  Past Medical History:  Diagnosis Date  . Arthritis   . Bronchitis    history of  . 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 GERD (gastroesophageal reflux disease)   . Hiatal hernia   . 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 Hypercalcemia   . Lipoma    left forearm (3)  . Mitral valve prolapse    does not see cardiologist for. last stress test 2003  . Pneumonia 2009  . Tumor cells, benign    lung, left side  . Unstable angina ([place]) 10/28/2017    SURGICAL HISTORY: Past Surgical History:  Procedure Laterality Date  . APPENDECTOMY  1984  . CATARACT EXTRACTION     L and R eye  . CHOLECYSTECTOMY  2002  . HYDROCELE EXCISION Right 11/04/2016   Procedure: HYDROCELECTOMY ADULT;  Surgeon: Franchot Gallo, MD;  Location: [facility];  Service: Urology;  Laterality: Right;  . INGUINAL HERNIA REPAIR Bilateral 11/04/2016   Procedure: HERNIA REPAIR INGUINAL ADULT BILATERAL;  Surgeon: Jackolyn Confer, MD;  Location: [facility];  Service: General;  Laterality: Bilateral;  . INSERTION OF MESH Bilateral 11/04/2016   Procedure: INSERTION  OF MESH;  Surgeon: Jackolyn Confer, MD;  Location: [facility];  Service: General;  Laterality: Bilateral;  . JOINT REPLACEMENT     Lt hip  . LEFT HEART CATH AND CORONARY ANGIOGRAPHY N/A 10/31/2017   Procedure: LEFT HEART CATH AND CORONARY ANGIOGRAPHY;  Surgeon: Troy Sine, MD;  Location: [facility];  Service: Cardiovascular;  Laterality: N/A;  . MASTOIDECTOMY  1996  . NASAL SEPTUM SURGERY     sinus surgery  . PARATHYROIDECTOMY    . TOTAL HIP REVISION  06/14/2011   Procedure: TOTAL  HIP REVISION;  Surgeon: Kerin Salen;  Location: [facility];  Service: Orthopedics;  Laterality: Left;  Left Acetabular  Hip Revision    SOCIAL HISTORY: Social History   Socioeconomic History  . Marital status: Married    Spouse name: Arville Go  . Number of children: 3  . Years of education: masters  . Highest education level: Not on file  Occupational History    Comment: Sales  Tobacco Use  . Smoking status: Former Smoker    Packs/day: 1.50    Years: 24.00    Pack years: 36.00    Types: Cigarettes    Quit date: 07/06/1987    Years since quitting: 32.1  . Smokeless tobacco: Former Network engineer and Sexual Activity  . Alcohol use: Yes    Alcohol/week: 1.0 standard drinks    Types: 1 Cans of beer per week  . Drug use: No  . Sexual activity: Not on file  Other Topics Concern  . 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   Caffeine - one cup daily.   Social Determinants of Health   Financial Resource Strain:   . Difficulty of Paying Living Expenses: Not on file  Food Insecurity:   . Worried About Charity fundraiser in the Last Year: Not on file  . Ran Out of Food in the Last Year: Not on file  Transportation Needs:   . Lack of Transportation (Medical): Not on file  . Lack of Transportation (Non-Medical): Not on file  Physical Activity:   . Days of Exercise per Week: Not on file  . Minutes of Exercise per Session: Not on file  Stress:   . Feeling of Stress : Not on file  Social Connections:   . Frequency of Communication with Friends and Family: Not on file  . Frequency of Social Gatherings with Friends and Family: Not on file  . Attends Religious Services: Not on file  . Active Member of Clubs or Organizations: Not on file  . Attends Archivist Meetings: Not on file  . Marital Status: Not on file  Intimate Partner Violence:   . Fear of Current or  Ex-Partner: Not on file  . Emotionally Abused: Not on file  . Physically Abused: Not on file  . Sexually Abused: Not on file    FAMILY HISTORY: Family History  Problem Relation Age of Onset  . Lung cancer Father 80       smoked  . Heart Problems Father   . Cancer Brother        prostate  . Heart Problems Mother     ALLERGIES:  is allergic to ciprofloxacin hcl; bupropion; ciprofloxacin-dexamethasone; and gabapentin.  MEDICATIONS:  Current Outpatient Medications  Medication Sig Dispense Refill  . Multiple Vitamin (MULTIVITAMIN) tablet Take 1 tablet by mouth  daily.    . aspirin 81 MG chewable tablet Chew 81 mg by mouth daily.    . famotidine (PEPCID) 20 MG tablet Take 1 tablet (20 mg total) by mouth at bedtime. 30 tablet 3  . FLUoxetine (PROZAC) 40 MG capsule Take 40 mg by mouth every morning.     . methylphenidate (RITALIN) 10 MG tablet Take 10 mg by mouth 3 (three) times daily.      . metoprolol succinate (TOPROL XL) 25 MG 24 hr tablet Take 1 tablet (25 mg total) by mouth daily. 90 tablet 3  . mirtazapine (REMERON) 15 MG tablet Take 15 mg by mouth at bedtime.    . Multiple Vitamin (MULTIVITAMIN WITH MINERALS) TABS tablet Take 1 tablet by mouth daily.    . nitroGLYCERIN (NITROSTAT) 0.4 MG SL tablet     . omeprazole (PRILOSEC) 20 MG capsule Take 20 mg by mouth 2 (two) times daily before a meal.     . polyethylene glycol (MIRALAX / GLYCOLAX) packet Take 17 g by mouth daily as needed (for constipation.).     Marland Kitchen rosuvastatin (CRESTOR) 10 MG tablet TAKE 1 TABLET(10 MG) BY MOUTH DAILY (Patient taking differently: Take 10 mg by mouth daily. ) 90 tablet 3  . SYNTHROID 25 MCG tablet Take 25 mcg by mouth daily before breakfast.     . tamsulosin (FLOMAX) 0.4 MG CAPS capsule Take 0.4 mg by mouth daily after breakfast.      No current facility-administered medications for this visit.    REVIEW OF SYSTEMS:   Constitutional: ( - ) fevers, ( - )  chills , ( - ) night sweats Eyes: ( - )  blurriness of vision, ( - ) double vision, ( - ) watery eyes Ears, nose, mouth, throat, and face: ( - ) mucositis, ( - ) sore throat Respiratory: ( - ) cough, ( - ) dyspnea, ( - ) wheezes Cardiovascular: ( - ) palpitation, ( - ) chest discomfort, ( - ) lower extremity swelling Gastrointestinal:  ( - ) nausea, ( - ) heartburn, ( - ) change in bowel habits Skin: ( - ) abnormal skin rashes Lymphatics: ( - ) new lymphadenopathy, ( - ) easy bruising Neurological: ( - ) numbness, ( - ) tingling, ( - ) new weaknesses Behavioral/Psych: ( - ) mood change, ( - ) new changes  All other systems were reviewed with the patient and are negative.  PHYSICAL EXAMINATION: ECOG PERFORMANCE STATUS: 1 - Symptomatic but completely ambulatory  Vitals:   08/17/19 1358  BP: 128/71  Pulse: 74  Resp: 20  Temp: 98 F (36.7 C)  SpO2: 100%   Filed Weights   08/17/19 1358  Weight: 172 lb 3.2 oz (78.1 kg)    GENERAL: well appearing elderly Caucasian male in NAD  SKIN: skin color, texture, turgor are normal, no rashes or significant lesions EYES: conjunctiva are pink and non-injected, sclera clear LYMPH:   palpable lymphadenopathy in the cervical and inguinal lymph nodes. No palpable in the axilla or supraclavicular nodes.  LUNGS: clear to auscultation and percussion with normal breathing effort HEART: regular rate & rhythm and no murmurs and no lower extremity edema ABDOMEN: soft, non-tender, non-distended, normal bowel sounds. No HSM appreciated.  Musculoskeletal: no cyanosis of digits and no clubbing  PSYCH: alert & oriented x 3, fluent speech NEURO: no focal motor/sensory deficits  LABORATORY DATA:  I have reviewed the data as listed CBC Latest Ref Rng & Units 08/17/2019 07/17/2019 06/19/2019  WBC 4.0 -  10.5 K/uL 5.3 8.2 7.3  Hemoglobin 13.0 - 17.0 g/dL 15.4 13.8 13.6  Hematocrit 39.0 - 52.0 % 46.4 41.4 39.9  Platelets 150 - 400 K/uL 112(L) 216.0 183    CMP Latest Ref Rng & Units 08/17/2019 06/19/2019  12/29/2018  Glucose 70 - 99 mg/dL 103(H) 118(H) 133(H)  BUN 8 - 23 mg/dL '13 17 18  ' Creatinine 0.61 - 1.24 mg/dL 1.01 1.04 1.00  Sodium 135 - 145 mmol/L 143 144 139  Potassium 3.5 - 5.1 mmol/L 3.9 4.0 4.1  Chloride 98 - 111 mmol/L 106 108 102  CO2 22 - 32 mmol/L 27 28 -  Calcium 8.9 - 10.3 mg/dL 8.8(L) 8.7(L) -  Total Protein 6.5 - 8.1 g/dL 6.9 6.2(L) -  Total Bilirubin 0.3 - 1.2 mg/dL 0.3 0.5 -  Alkaline Phos 38 - 126 U/L 84 72 -  AST 15 - 41 U/L 25 25 -  ALT 0 - 44 U/L 43 20 -     PATHOLOGY: None relevant to review.   BLOOD FILM:  Review of the peripheral blood smear showed normal appearing white cells with neutrophils that were appropriately lobated and granulated. There was no predominance of bi-lobed or hyper-segmented neutrophils appreciated. No Dohle bodies were noted. There was no left shifting, immature forms or blasts noted. Lymphocytes remain normal in size without any predominance of large granular lymphocytes. Several reactive lymphocytes were noted. Red cells show no anisopoikilocytosis, macrocytes , microcytes or polychromasia. There were no schistocytes, target cells, echinocytes, acanthocytes, dacrocytes, or stomatocytes.There was no rouleaux formation, nucleated red cells, or intra-cellular inclusions noted. The platelets are normal in size, shape, and color without any clumping evident.  RADIOGRAPHIC STUDIES: US Venous Img Lower Bilateral (DVT)  Result Date: 08/03/2019 CLINICAL DATA:  Bilateral lower extremity swelling EXAM: BILATERAL LOWER EXTREMITY VENOUS DOPPLER ULTRASOUND TECHNIQUE: Gray-scale sonography with compression, as well as color and duplex ultrasound, were performed to evaluate the deep venous system(s) from the level of the common femoral vein through the popliteal and proximal calf veins. COMPARISON:  None. FINDINGS: VENOUS Normal compressibility of the common femoral, superficial femoral, and popliteal veins, as well as the visualized calf veins. Visualized  portions of profunda femoral veins and great saphenous veins unremarkable. No filling defects to suggest DVT on grayscale or color Doppler imaging. Doppler waveforms show normal direction of venous flow, normal respiratory phasicity and response to augmentation. OTHER Prominent right inguinal lymph node up to 1.3 cm short axis diameter. 2 cm left inguinal lymph node with fatty hilum. Limitations: none IMPRESSION: 1. No femoropopliteal and no calf DVT in the visualized calf veins. If clinical symptoms are inconsistent or if there are persistent or worsening symptoms, further imaging (possibly involving the iliac veins) may be warranted. 2. Prominent bilateral inguinal lymph nodes, possibly reactive but nonspecific. Electronically Signed   By: Lucrezia Europe M.D.   On: 08/03/2019 17:00    ASSESSMENT & PLAN YASH CACCIOLA 69 y.o. male with medical history significant for GERD, dysrhythmia w/ MVP, familial lipomatosis, and bronchitis who presents for evaluation of leukocytosis and lymphadenopathy.  After review of the lab discussion with the patient his findings are most consistent with a transient leukocytosis and lymphadenopathy secondary to a viral infection.  On labs today his leukocytosis has returned to baseline levels.  Although his lymphadenopathy has remained I would recommend reevaluation in approximately 6 weeks time.  Also in the interim the patient is due for a's high-resolution CT scan of the chest which would help [place] rule  out any additional lymphadenopathy or solid tumor malignancy.  I would recommend that a CT of the abdomen be added on to the study in order to get a full picture of his liver and spleen as well.  Also of note during his prior evaluations he was found to have an elevation rheumatoid factor which could potentially be due to an inflammatory condition driving his leukocytosis and lymphadenopathy.  If the lymphadenopathy persists and the CT imaging shows no evidence of malignancy I do  think would be reasonable to consider rheumatological evaluation for a possible source.  I will have the patient follow-up in short order in approximately 6 weeks time to assure the lymphadenopathy is not expanding and that the blood counts have not worsened.  #Lymphadenopathy, Inguinal and Cervical #Leukocytosis, resolved --today will order CMP, CBC, and peripheral blood film --additionally will collect flow cytometry to help r/o malignant source. Additionally will collect PSA and LDH.   --will collect inflammatory markers with RF, CRP, and ESR --viral workup to include Hep B, Hep C, and HIV --CMV and EBV could be considered, though likely patient had an infection at one time in his lifetime. Could consider PCR testing if lymph nodes persist. Findings would not likely change management at this time.  --hold on testing like likely etiologies including syphilis and toxoplasmosis --RTC in 6 weeks to re-evaluate.   #Pulmonary Nodules, chronic --agree with high resolution CT scan of the chest to better characterize the lung nodes noted on CT from 12/29/2015.  --recommend addition of the CT of the abdomen/pelvis as well --continue to monitor   Orders Placed This Encounter  Procedures  . CT ABDOMEN PELVIS W CONTRAST    Standing Status:   Future    Standing Expiration Date:   11/17/2020    Scheduling Instructions:     If possible, perform at same time as Chest CT on the same date.    Order Specific Question:   If indicated for the ordered procedure, I authorize the administration of contrast media per Radiology protocol    Answer:   Yes    Order Specific Question:   Preferred imaging location?    Answer:   [facility]    Order Specific Question:   Is Oral Contrast requested for this exam?    Answer:   Yes, Per Radiology protocol    Order Specific Question:   Call Results- Best Contact Number?    Answer:   [phone]    Order Specific Question:   Radiology Contrast Protocol - do NOT  remove file path    Answer:   [email or web address]  . CBC with Differential ([facility] Only)    Standing Status:   Future    Number of Occurrences:   1    Standing Expiration Date:   08/16/2020  . Save Smear (SS[facility])    Standing Status:   Future    Number of Occurrences:   1    Standing Expiration Date:   08/16/2020  . CMP ([place] only)    Standing Status:   Future    Number of Occurrences:   1    Standing Expiration Date:   08/16/2020  . Lactate dehydrogenase (LDH)    Standing Status:   Future    Number of Occurrences:   1    Standing Expiration Date:   08/16/2020  . Flow Cytometry    Standing Status:   Future    Number of Occurrences:   1    Standing  Expiration Date:   08/16/2020  . Sedimentation rate    Standing Status:   Future    Number of Occurrences:   1    Standing Expiration Date:   08/16/2020  . C-reactive protein    Standing Status:   Future    Number of Occurrences:   1    Standing Expiration Date:   08/16/2020  . PSA, total and free    Standing Status:   Future    Number of Occurrences:   1    Standing Expiration Date:   08/16/2020  . Rheumatoid factor    Standing Status:   Future    Number of Occurrences:   1    Standing Expiration Date:   08/16/2020  . Hepatitis C antibody    Standing Status:   Future    Number of Occurrences:   1    Standing Expiration Date:   08/16/2020  . Hepatitis B core antibody, total    Standing Status:   Future    Number of Occurrences:   1    Standing Expiration Date:   08/16/2020  . Hepatitis B surface antibody    Standing Status:   Future    Number of Occurrences:   1    Standing Expiration Date:   08/16/2020  . Hepatitis B surface antigen    Standing Status:   Future    Number of Occurrences:   1    Standing Expiration Date:   08/16/2020    All questions were answered. The patient knows to call the clinic with any problems, questions or concerns.  A total of more than 60 minutes were spent on  this encounter and over half of that time was spent on counseling and coordination of care as outlined above.   Ledell Peoples, MD Department of Hematology/Oncology [place] [facility] Phone: [phone] Pager: [phone] Email: [email or web address]  08/21/2019 2:48 PM

## 2019-08-17 ENCOUNTER — Inpatient Hospital Stay: Payer: Medicare Other

## 2019-08-17 ENCOUNTER — Inpatient Hospital Stay: Payer: Medicare Other | Attending: Hematology and Oncology | Admitting: Hematology and Oncology

## 2019-08-17 ENCOUNTER — Other Ambulatory Visit: Payer: Self-pay

## 2019-08-17 VITALS — BP 128/71 | HR 74 | Temp 98.0°F | Resp 20 | Ht 72.0 in | Wt 172.2 lb

## 2019-08-17 DIAGNOSIS — Z801 Family history of malignant neoplasm of trachea, bronchus and lung: Secondary | ICD-10-CM

## 2019-08-17 DIAGNOSIS — R599 Enlarged lymph nodes, unspecified: Secondary | ICD-10-CM

## 2019-08-17 DIAGNOSIS — R935 Abnormal findings on diagnostic imaging of other abdominal regions, including retroperitoneum: Secondary | ICD-10-CM | POA: Diagnosis not present

## 2019-08-17 DIAGNOSIS — Z800 Family history of malignant neoplasm of digestive organs: Secondary | ICD-10-CM | POA: Diagnosis not present

## 2019-08-17 DIAGNOSIS — R590 Localized enlarged lymph nodes: Secondary | ICD-10-CM | POA: Diagnosis not present

## 2019-08-17 DIAGNOSIS — Z806 Family history of leukemia: Secondary | ICD-10-CM | POA: Insufficient documentation

## 2019-08-17 DIAGNOSIS — Z8616 Personal history of COVID-19: Secondary | ICD-10-CM | POA: Diagnosis not present

## 2019-08-17 DIAGNOSIS — Z8051 Family history of malignant neoplasm of kidney: Secondary | ICD-10-CM | POA: Diagnosis not present

## 2019-08-17 DIAGNOSIS — D72820 Lymphocytosis (symptomatic): Secondary | ICD-10-CM

## 2019-08-17 DIAGNOSIS — R760 Raised antibody titer: Secondary | ICD-10-CM | POA: Diagnosis not present

## 2019-08-17 DIAGNOSIS — R591 Generalized enlarged lymph nodes: Secondary | ICD-10-CM | POA: Diagnosis not present

## 2019-08-17 DIAGNOSIS — Z8042 Family history of malignant neoplasm of prostate: Secondary | ICD-10-CM | POA: Insufficient documentation

## 2019-08-17 DIAGNOSIS — Z87891 Personal history of nicotine dependence: Secondary | ICD-10-CM | POA: Diagnosis not present

## 2019-08-17 DIAGNOSIS — R972 Elevated prostate specific antigen [PSA]: Secondary | ICD-10-CM

## 2019-08-17 DIAGNOSIS — R918 Other nonspecific abnormal finding of lung field: Secondary | ICD-10-CM

## 2019-08-17 LAB — CMP (CANCER CENTER ONLY)
ALT: 43 U/L (ref 0–44)
AST: 25 U/L (ref 15–41)
Albumin: 3.8 g/dL (ref 3.5–5.0)
Alkaline Phosphatase: 84 U/L (ref 38–126)
Anion gap: 10 (ref 5–15)
BUN: 13 mg/dL (ref 8–23)
CO2: 27 mmol/L (ref 22–32)
Calcium: 8.8 mg/dL — ABNORMAL LOW (ref 8.9–10.3)
Chloride: 106 mmol/L (ref 98–111)
Creatinine: 1.01 mg/dL (ref 0.61–1.24)
GFR, Est AFR Am: >60 mL/min (ref >60)
GFR, Estimated: >60 mL/min (ref >60)
Glucose, Bld: 103 mg/dL — ABNORMAL HIGH (ref 70–99)
Potassium: 3.9 mmol/L (ref 3.5–5.1)
Sodium: 143 mmol/L (ref 135–145)
Total Bilirubin: 0.3 mg/dL (ref 0.3–1.2)
Total Protein: 6.9 g/dL (ref 6.5–8.1)

## 2019-08-17 LAB — CBC WITH DIFFERENTIAL (CANCER CENTER ONLY)
Abs Immature Granulocytes: 0.01 10*3/uL (ref 0.00–0.07)
Basophils Absolute: 0 10*3/uL (ref 0.0–0.1)
Basophils Relative: 0 %
Eosinophils Absolute: 0.2 10*3/uL (ref 0.0–0.5)
Eosinophils Relative: 3 %
HCT: 46.4 % (ref 39.0–52.0)
Hemoglobin: 15.4 g/dL (ref 13.0–17.0)
Immature Granulocytes: 0 %
Lymphocytes Relative: 26 %
Lymphs Abs: 1.4 10*3/uL (ref 0.7–4.0)
MCH: 30.5 pg (ref 26.0–34.0)
MCHC: 33.2 g/dL (ref 30.0–36.0)
MCV: 91.9 fL (ref 80.0–100.0)
Monocytes Absolute: 0.4 10*3/uL (ref 0.1–1.0)
Monocytes Relative: 7 %
Neutro Abs: 3.4 10*3/uL (ref 1.7–7.7)
Neutrophils Relative %: 64 %
Platelet Count: 112 10*3/uL — ABNORMAL LOW (ref 150–400)
RBC: 5.05 MIL/uL (ref 4.22–5.81)
RDW: 11.9 % (ref 11.5–15.5)
WBC Count: 5.3 10*3/uL (ref 4.0–10.5)
nRBC: 0 % (ref 0.0–0.2)

## 2019-08-17 LAB — SEDIMENTATION RATE: Sed Rate: 12 mm/hr (ref 0–16)

## 2019-08-17 LAB — HEPATITIS C ANTIBODY: HCV Ab: NONREACTIVE

## 2019-08-17 LAB — HEPATITIS B SURFACE ANTIGEN: Hepatitis B Surface Ag: NONREACTIVE

## 2019-08-17 LAB — SAVE SMEAR(SSMR), FOR PROVIDER SLIDE REVIEW

## 2019-08-17 LAB — C-REACTIVE PROTEIN: CRP: 1.1 mg/dL — ABNORMAL HIGH (ref <1.0)

## 2019-08-17 LAB — HEPATITIS B SURFACE ANTIBODY,QUALITATIVE: Hep B S Ab: NONREACTIVE

## 2019-08-17 LAB — HEPATITIS B CORE ANTIBODY, TOTAL: Hep B Core Total Ab: NONREACTIVE

## 2019-08-18 LAB — PSA, TOTAL AND FREE
PSA, Free Pct: 23.5 %
PSA, Free: 0.54 ng/mL
Prostate Specific Ag, Serum: 2.3 ng/mL (ref 0.0–4.0)

## 2019-08-18 LAB — RHEUMATOID FACTOR: Rheumatoid fact SerPl-aCnc: 206.6 IU/mL — ABNORMAL HIGH (ref 0.0–13.9)

## 2019-08-20 ENCOUNTER — Telehealth: Payer: Self-pay | Admitting: Hematology and Oncology

## 2019-08-20 LAB — FUNGUS CULTURE W SMEAR
MICRO NUMBER:: 10047185
SPECIMEN QUALITY:: ADEQUATE

## 2019-08-20 LAB — RESPIRATORY CULTURE OR RESPIRATORY AND SPUTUM CULTURE
MICRO NUMBER:: 10047186
SPECIMEN QUALITY:: ADEQUATE

## 2019-08-20 LAB — LACTATE DEHYDROGENASE: LDH: 176 U/L (ref 98–192)

## 2019-08-20 LAB — SURGICAL PATHOLOGY

## 2019-08-20 NOTE — Telephone Encounter (Signed)
No los per 2/12.

## 2019-08-21 ENCOUNTER — Encounter: Payer: Self-pay | Admitting: Hematology and Oncology

## 2019-08-21 LAB — FLOW CYTOMETRY

## 2019-08-23 ENCOUNTER — Other Ambulatory Visit: Payer: Medicare Other

## 2019-08-27 ENCOUNTER — Ambulatory Visit: Payer: Medicare Other | Admitting: Primary Care

## 2019-08-27 ENCOUNTER — Ambulatory Visit: Payer: Medicare Other

## 2019-08-27 NOTE — Progress Notes (Deleted)
@Patient  ID: [name], male    DOB: 1951/05/21, 69 y.o.   MRN: [id number]  No chief complaint on file.   Referring provider: Jonathon Jordan, MD  HPI: 68 year old male, former smoker quit in 1989. PMH significant for chronic sinusitis, upper airway cough syndrome, recurrent pneumonia, chronic respiratory failure, GERD, barrett's esophagus, unstable angina, mobitz 1 second degree AV block, hyperparathyroidism. Patient of Dr. Melvyn Novas, last seen on 09/13/2016. He has been treated for UACS in the past with GERD rx, tylenol #3 and gabapentin.    Seen in ED on 06/19/19 for bronchitis symptoms. Covid negative. Given albuterol and prednisone. CT chest on 07/13/19 showed mild ground-glass opacity of the bilateral upper lobes and lesser degree the lower lobes, which was present on comparison CT of 2017, more prominent now. Discharged with 5 day course oral prednisone.   Previous [facility] pulmonary encounter: 07/16/2019 Patient presents today for follow-up. He has not been seen in over 2 years. Reports that he has had a dry cough for 1 month. Occasional clear mucus. Cough is worse in evening and wakes him up at nights. He does report significant history of GERD. Currently taking omeprazole 20mg  twice daily and Pepcid. He has three episode of coughing fits/spams day. He noticed pink tinge sputum on two occasions but no bright red blood. He takes asa 81 mg daily. He was taking robitussin dm. Reports no noticeable improvement with prednisone, tessalon, albuterol.   07/30/2019 Patient presents today for 2 week follow-up. Reports persistent dry cough for 2 months. He has noticed some shortness of breath over last several months but particularly worse the last week or two. Associated fatigue and rare wheezing with coughing fits. Hears craking in lungs when breathing. He has seen no improvement with medications. He able to do all ADLs. Sputum sample showed oropharyngeal contamination. It did, however, show did budding  yeast (likely oralpharyngeal). 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 Patient reports that they found black mold in bedroom humidifier. Given patient ILD questionnaire today.    08/27/2019 Patient presents today 1 month follow-up dry cough for several months. He has an upcoming apt with Dr. Brantley Persons in ILD clinic and HRCT.      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 No exposure to birds, feather/down bedding or hot tub    Allergies  Allergen Reactions  . Ciprofloxacin Hcl Other (See Comments)    Burning in ears when drops were used  . Bupropion Other (See Comments)    Caused altered mental status Altered mental status Caused altered mental status  . Ciprofloxacin-Dexamethasone Other (See Comments)    Burning in ears when Cipro ear drops were used unknown Burning in ears when Cipro ear drops were used  . Gabapentin Other (See Comments)    Caused weakness, dizziness, and forgetfulness  Weakness and dizziness with higher doses. Weakness and dizziness Caused weakness, dizziness, and forgetfulness    Immunization History  Administered Date(s) Administered  . Influenza Split 04/05/2015, 03/30/2019  . Influenza, High Dose Seasonal PF 03/30/2016  . PFIZER SARS-COV-2 Vaccination 08/12/2019  . Pneumococcal Polysaccharide-23 10/24/2007    Past Medical History:  Diagnosis Date  . Arthritis   . Bronchitis    history of  . 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 GERD (gastroesophageal reflux disease)   . Hiatal hernia   . 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 Hypercalcemia   .  Lipoma    left forearm (3)  . Mitral valve prolapse    does not see cardiologist for. last stress test 2003  . Pneumonia 2009  . Tumor cells, benign    lung, left side  . Unstable angina ([place]) 10/28/2017    Tobacco History: Social History   Tobacco Use  Smoking  Status Former Smoker  . Packs/day: 1.50  . Years: 24.00  . Pack years: 36.00  . Types: Cigarettes  . Quit date: 07/06/1987  . 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 aspirin 81 MG chewable tablet Chew 81 mg by mouth daily.    . famotidine (PEPCID) 20 MG tablet Take 1 tablet (20 mg total) by mouth at bedtime. 30 tablet 3  . FLUoxetine (PROZAC) 40 MG capsule Take 40 mg by mouth every morning.     . methylphenidate (RITALIN) 10 MG tablet Take 10 mg by mouth 3 (three) times daily.      . metoprolol succinate (TOPROL XL) 25 MG 24 hr tablet Take 1 tablet (25 mg total) by mouth daily. 90 tablet 3  . mirtazapine (REMERON) 15 MG tablet Take 15 mg by mouth at bedtime.    . Multiple Vitamin (MULTIVITAMIN WITH MINERALS) TABS tablet Take 1 tablet by mouth daily.    . Multiple Vitamin (MULTIVITAMIN) tablet Take 1 tablet by mouth daily.    . nitroGLYCERIN (NITROSTAT) 0.4 MG SL tablet     . omeprazole (PRILOSEC) 20 MG capsule Take 20 mg by mouth 2 (two) times daily before a meal.     . polyethylene glycol (MIRALAX / GLYCOLAX) packet Take 17 g by mouth daily as needed (for constipation.).     Marland Kitchen rosuvastatin (CRESTOR) 10 MG tablet TAKE 1 TABLET(10 MG) BY MOUTH DAILY (Patient taking differently: Take 10 mg by mouth daily. ) 90 tablet 3  . SYNTHROID 25 MCG tablet Take 25 mcg by mouth daily before breakfast.     . tamsulosin (FLOMAX) 0.4 MG CAPS capsule Take 0.4 mg by mouth daily after breakfast.      No facility-administered medications prior to visit.      Review of Systems  Review of Systems   Physical Exam  There were no vitals taken for this visit. Physical Exam   Lab Results:  CBC    Component Value Date/Time   WBC 5.3 08/17/2019 1517   WBC 8.2 07/17/2019 1421   RBC 5.05 08/17/2019 1517   HGB 15.4 08/17/2019 1517   HCT 46.4 08/17/2019 1517   PLT 112 (L)  08/17/2019 1517   MCV 91.9 08/17/2019 1517   MCH 30.5 08/17/2019 1517   MCHC 33.2 08/17/2019 1517   RDW 11.9 08/17/2019 1517   LYMPHSABS 1.4 08/17/2019 1517   MONOABS 0.4 08/17/2019 1517   EOSABS 0.2 08/17/2019 1517   BASOSABS 0.0 08/17/2019 1517    BMET    Component Value Date/Time   NA 143 08/17/2019 1517   NA 142 02/13/2018 0851   K 3.9 08/17/2019 1517   CL 106 08/17/2019 1517   CO2 27 08/17/2019 1517   GLUCOSE 103 (H) 08/17/2019 1517   BUN 13 08/17/2019 1517   BUN 16 02/13/2018 0851   CREATININE 1.01 08/17/2019 1517   CALCIUM 8.8 (L) 08/17/2019 1517   CALCIUM 9.0 02/19/2011 1233   GFRNONAA >60 08/17/2019 1517   GFRAA >60 08/17/2019 1517    BNP  Component Value Date/Time   BNP 42.2 06/19/2019 0902    ProBNP No results found for: PROBNP  Imaging: US Venous Img Lower Bilateral (DVT)  Result Date: 08/03/2019 CLINICAL DATA:  Bilateral lower extremity swelling EXAM: BILATERAL LOWER EXTREMITY VENOUS DOPPLER ULTRASOUND TECHNIQUE: Gray-scale sonography with compression, as well as color and duplex ultrasound, were performed to evaluate the deep venous system(s) from the level of the common femoral vein through the popliteal and proximal calf veins. COMPARISON:  None. FINDINGS: VENOUS Normal compressibility of the common femoral, superficial femoral, and popliteal veins, as well as the visualized calf veins. Visualized portions of profunda femoral veins and great saphenous veins unremarkable. No filling defects to suggest DVT on grayscale or color Doppler imaging. Doppler waveforms show normal direction of venous flow, normal respiratory phasicity and response to augmentation. OTHER Prominent right inguinal lymph node up to 1.3 cm short axis diameter. 2 cm left inguinal lymph node with fatty hilum. Limitations: none IMPRESSION: 1. No femoropopliteal and no calf DVT in the visualized calf veins. If clinical symptoms are inconsistent or if there are persistent or worsening  symptoms, further imaging (possibly involving the iliac veins) may be warranted. 2. Prominent bilateral inguinal lymph nodes, possibly reactive but nonspecific. Electronically Signed   By: Lucrezia Europe M.D.   On: 08/03/2019 17:00     Assessment & Plan:   No problem-specific Assessment & Plan notes found for this encounter.     Martyn Ehrich, NP 08/27/2019

## 2019-08-29 ENCOUNTER — Telehealth: Payer: Self-pay | Admitting: Hematology and Oncology

## 2019-08-29 NOTE — Telephone Encounter (Signed)
Called Mr. [name] to discuss the results of his bloodwork. Findings were reassuring with no elevations in LDH or findings on flow cytometry. WBC returned to baseline levels. No Hep B or C noted on serology.  Of note, he had a new thrombocytopenia noted on CBC without clear evidence of clumping on peripheral blood film. The etiology of this is unclear (and findings are not consistent with prior), but we will reassess with next set of bloodwork.  At this time findings are less consistent with a hematological malignancy and more concerning for rheumatological vs transient viral etiology. Patient has rheumatology f/u on 10/08/2019. We will plan to see the patient 2-3 weeks after that time to reassess.   Ledell Peoples, MD Department of Hematology/Oncology [place] [facility] Phone: [phone] Pager: [phone] Email: [email or web address]

## 2019-08-30 ENCOUNTER — Other Ambulatory Visit: Payer: Self-pay

## 2019-08-30 ENCOUNTER — Ambulatory Visit (HOSPITAL_COMMUNITY)
Admission: RE | Admit: 2019-08-30 | Discharge: 2019-08-30 | Disposition: A | Discharge status: Home / Self Care | Payer: Medicare Other | Source: Ambulatory Visit | Attending: Hematology and Oncology | Admitting: Hematology and Oncology

## 2019-08-30 DIAGNOSIS — R05 Cough: Secondary | ICD-10-CM | POA: Insufficient documentation

## 2019-08-30 DIAGNOSIS — J439 Emphysema, unspecified: Secondary | ICD-10-CM | POA: Diagnosis not present

## 2019-08-30 DIAGNOSIS — R059 Cough, unspecified: Secondary | ICD-10-CM

## 2019-08-30 DIAGNOSIS — R599 Enlarged lymph nodes, unspecified: Secondary | ICD-10-CM | POA: Insufficient documentation

## 2019-08-30 DIAGNOSIS — R590 Localized enlarged lymph nodes: Secondary | ICD-10-CM | POA: Diagnosis not present

## 2019-08-30 MED ORDER — SODIUM CHLORIDE (PF) 0.9 % IJ SOLN
INTRAMUSCULAR | Status: AC
  Filled 2019-08-30: qty 50

## 2019-08-30 MED ORDER — IOHEXOL 300 MG/ML  SOLN
100.0000 mL | Freq: Once | INTRAMUSCULAR | Status: AC | PRN
  Administered 2019-08-30: 100 mL via INTRAVENOUS

## 2019-08-31 ENCOUNTER — Other Ambulatory Visit: Payer: Medicare Other

## 2019-08-31 NOTE — Progress Notes (Signed)
Please let patient know to keep apt with MR in April and will discuss CT results. No urgent findings.

## 2019-09-03 ENCOUNTER — Telehealth: Payer: Self-pay | Admitting: *Deleted

## 2019-09-03 NOTE — Telephone Encounter (Signed)
-----   Message from Orson Slick, MD sent at 09/03/2019  9:04 AM EST ----- Please call Mr. Jackovich to let him know the results of his CT scan. No clear evidence of enlarged lymph nodes in the abdomen or chest. Findings are reassuring.  We will be following up with this patient in late April.  Colan Neptune  ----- Message ----- From: Buel Ream, Rad Results In Sent: 08/30/2019   2:20 PM EST To: Orson Slick, MD

## 2019-09-03 NOTE — Telephone Encounter (Signed)
TCT patient regarding CT scan results. Spoke with pt's wife as pt is not home. Informed her that the results of [name]'s recent CT scan were good. No evidence of enlarged lymph nodes in the the abdomen or chest.  Pt to have return appt in late April.

## 2019-09-05 ENCOUNTER — Ambulatory Visit: Payer: Medicare Other | Attending: Internal Medicine

## 2019-09-05 DIAGNOSIS — Z23 Encounter for immunization: Secondary | ICD-10-CM | POA: Insufficient documentation

## 2019-09-05 NOTE — Progress Notes (Signed)
   Covid-19 Vaccination Clinic  Name:  [name]    MRN: [id number] DOB: 11/16/1950  09/05/2019  [name] was observed post Covid-19 immunization for 15 minutes without incident. He was provided with Vaccine Information Sheet and instruction to access the V-Safe system.   [name] was instructed to call 911 with any severe reactions post vaccine: Marland Kitchen Difficulty breathing  . Swelling of face and throat  . A fast heartbeat  . A bad rash all over body  . Dizziness and weakness   Immunizations Administered    Name Date Dose VIS Date Route   Pfizer COVID-19 Vaccine 09/05/2019  9:26 AM 0.3 mL 06/15/2019 Intramuscular   Manufacturer: [place]   Lot: [id number]   [place]: [id number]

## 2019-09-06 ENCOUNTER — Ambulatory Visit: Payer: Medicare Other | Admitting: Internal Medicine

## 2019-09-14 LAB — AFB CULTURE WITH SMEAR (NOT AT ARMC)
Acid Fast Culture: NEGATIVE
Acid Fast Smear: NEGATIVE

## 2019-09-22 ENCOUNTER — Other Ambulatory Visit (HOSPITAL_COMMUNITY)
Admission: RE | Admit: 2019-09-22 | Discharge: 2019-09-22 | Disposition: A | Discharge status: Home / Self Care | Payer: Medicare Other | Source: Ambulatory Visit | Attending: Primary Care | Admitting: Primary Care

## 2019-09-22 DIAGNOSIS — Z20822 Contact with and (suspected) exposure to covid-19: Secondary | ICD-10-CM | POA: Diagnosis not present

## 2019-09-22 DIAGNOSIS — Z01812 Encounter for preprocedural laboratory examination: Secondary | ICD-10-CM | POA: Diagnosis not present

## 2019-09-22 LAB — SARS CORONAVIRUS 2 (TAT 6-24 HRS): SARS Coronavirus 2: NEGATIVE

## 2019-09-24 DIAGNOSIS — H6123 Impacted cerumen, bilateral: Secondary | ICD-10-CM | POA: Diagnosis not present

## 2019-09-25 ENCOUNTER — Ambulatory Visit (INDEPENDENT_AMBULATORY_CARE_PROVIDER_SITE_OTHER): Payer: Medicare Other | Admitting: Pulmonary Disease

## 2019-09-25 ENCOUNTER — Other Ambulatory Visit: Payer: Self-pay

## 2019-09-25 DIAGNOSIS — R05 Cough: Secondary | ICD-10-CM

## 2019-09-25 DIAGNOSIS — R059 Cough, unspecified: Secondary | ICD-10-CM

## 2019-09-25 DIAGNOSIS — J849 Interstitial pulmonary disease, unspecified: Secondary | ICD-10-CM

## 2019-09-25 LAB — PULMONARY FUNCTION TEST
DL/VA % pred: 112 %
DL/VA: 4.55 ml/min/mmHg/L
DLCO cor % pred: 76 %
DLCO cor: 21.32 ml/min/mmHg
DLCO unc % pred: 78 %
DLCO unc: 21.79 ml/min/mmHg
FEF 25-75 Pre: 3.16 L/sec
FEF2575-%Pred-Pre: 116 %
FEV1-%Pred-Pre: 73 %
FEV1-Pre: 2.63 L
FEV1FVC-%Pred-Pre: 113 %
FEV6-%Pred-Pre: 69 %
FEV6-Pre: 3.15 L
FEV6FVC-%Pred-Pre: 105 %
FVC-%Pred-Pre: 65 %
FVC-Pre: 3.15 L
Pre FEV1/FVC ratio: 83 %
Pre FEV6/FVC Ratio: 100 %

## 2019-09-25 NOTE — Progress Notes (Signed)
Spiro/DLCO performed today. 

## 2019-10-01 ENCOUNTER — Ambulatory Visit: Payer: Medicare Other | Admitting: Physician Assistant

## 2019-10-01 ENCOUNTER — Other Ambulatory Visit: Payer: Self-pay | Admitting: Primary Care

## 2019-10-01 NOTE — Progress Notes (Signed)
Office Visit Note  Patient: [name]             Date of Birth: Sep 11, 1950           MRN: [id number]             PCP: Jonathon Jordan, MD Referring: Martyn Ehrich, NP Visit Date: 10/08/2019 Occupation: Retired Chief Financial Officer  Subjective:  Interstitial lung disease and positive rheumatoid factor.   History of Present Illness: [name] is a 69 y.o. male seen in consultation per request of his pulmonologist.  According to the patient he has had chronic bronchitis since he was a child.  He states in January 2020 he developed Covid and the symptoms lasted for the next 2 months.  He states during that time his bronchitis got worse and then finally improved.  In November 2020 he had another infection with a lot of coughing lasting for about 2 months.  He states he became very short of breath and went to the emergency room.  He was referred to a pulmonologist where he had a chest x-ray and CT scan.  The CT scan showed some groundglass appearance and enlarged lymph nodes.  He had high-resolution CT in February 2021 which showed mild bronchiectasis, bronchitis, bibasilar interstitial opacities with suspicion of UIP and a stable lymphadenopathy.  He states the cough has improved a lot now he has occasional cough maybe once or twice a day.  He states when the lab work was obtained he had positive rheumatoid factor for that reason he was referred to me.  He states that he has stiffness in his joints over the years and some cracking and popping but he denies any joint swelling.  He had left total hip replacement after a fall in 2004 for which she had a revision in 2008 by Dr. Mayer Camel.  He was doing really well but recently has been experiencing some discomfort in his left hip.  He has also noticed that he has very limited range of motion of his cervical spine and he has chronic lower back pain.  He states his back starts hurting after he is doing dishes or some activities at home.  Activities of  Daily Living:  Patient reports morning stiffness for 24 hours.   Patient Reports nocturnal pain.  Difficulty dressing/grooming: Denies Difficulty climbing stairs: Denies Difficulty getting out of chair: Reports Difficulty using hands for taps, buttons, cutlery, and/or writing: Reports  Review of Systems  Constitutional: Positive for fatigue. Negative for night sweats.  HENT: Negative for mouth sores, mouth dryness and nose dryness.   Eyes: Negative for redness, itching and dryness.  Respiratory: Positive for shortness of breath. Negative for difficulty breathing.   Cardiovascular: Negative for chest pain, palpitations, hypertension, irregular heartbeat and swelling in legs/feet.  Gastrointestinal: Negative for blood in stool, constipation and diarrhea.  Endocrine: Negative for increased urination.  Genitourinary: Negative for difficulty urinating.  Musculoskeletal: Positive for arthralgias, joint pain and morning stiffness. Negative for joint swelling, myalgias, muscle weakness, muscle tenderness and myalgias.  Skin: Negative for color change, rash, hair loss, nodules/bumps, redness, skin tightness, ulcers and sensitivity to sunlight.  Allergic/Immunologic: Negative for susceptible to infections.  Neurological: Positive for numbness. Negative for dizziness, fainting, headaches, memory loss, night sweats and weakness.  Hematological: Negative for bruising/bleeding tendency and swollen glands.  Psychiatric/Behavioral: Negative for depressed mood, confusion and sleep disturbance. The patient is not nervous/anxious.     PMFS History:  Patient Active Problem List   Diagnosis  Date Noted  . Abnormal CT of the chest 07/30/2019  . Elevated rheumatoid factor 07/30/2019  . Oral candidiasis 07/30/2019  . Sinus tachycardia 04/03/2019  . Confusion 01/02/2019  . Abnormal EKG-dynamic TWI 10/31/2017  . PVC's (premature ventricular contractions) 10/31/2017  . Family history of coronary artery  disease in father 10/31/2017  . Unstable angina ([place]) 10/28/2017  . Bilateral inguinal hernia 11/04/2016  . Mobitz type 1 second degree atrioventricular block 03/16/2016  . Syncope 03/15/2016  . Malnutrition of moderate degree 10/21/2015  . Hyperkinetic disorder   . Choreiform movements 10/20/2015  . Chronic respiratory failure with hypoxia ([place]) 09/14/2015  . Cough   . Acute respiratory distress 09/04/2015  . Barrett's esophagus 09/04/2015  . Dyspnea on exertion 09/04/2015  . Influenza   . CAP (community acquired pneumonia) 09/03/2015  . Upper airway cough syndrome 08/21/2015  . Chest pain with moderate risk of acute coronary syndrome 05/13/2015  . Bilateral inguinal hernia (BIH) 01/08/2014  . Ileus ([place]) 08/18/2012  . Pain due to Left hip joint prosthesis 06/13/2011  . Hyperparathyroidism, primary, s/p MI parathyroidectomy 7/18, 3.1 gm adenoma 02/19/2011  . LIPOMAS, MULTIPLE 10/24/2007  . HLD (hyperlipidemia) 10/24/2007  . Depression 10/24/2007  . ADD (attention deficit disorder) 10/24/2007  . OTITIS MEDIA 10/24/2007  . SINUSITIS, CHRONIC 10/24/2007  . PNEUMONIA, RECURRENT 10/24/2007  . HIP FRACTURE, LEFT 10/24/2007  . GENITAL HERPES, HX OF 10/24/2007  . TOBACCO USE, QUIT 10/24/2007  . HIP REPLACEMENT, TOTAL, HX OF 10/24/2007  . Other acquired absence of organ 10/24/2007  . G E R D 07/13/2007    Past Medical History:  Diagnosis Date  . Arthritis   . Bronchitis    history of  . 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 GERD (gastroesophageal reflux disease)   . Hiatal hernia   . 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 Hypercalcemia   . Lipoma    left forearm (3)  . Mitral valve prolapse    does not see cardiologist for. last stress test 2003  . Pneumonia 2009  . Tumor cells, benign    lung, left side  . Unstable angina ([place]) 10/28/2017    Family History  Problem Relation Age of Onset  . Lung cancer Father 75       smoked  . Heart Problems Father   . Cancer  Brother        prostate  . Heart Problems Mother   . Neuropathy Sister   . Healthy Son   . Healthy Daughter   . Healthy Daughter    Past Surgical History:  Procedure Laterality Date  . APPENDECTOMY  1984  . CATARACT EXTRACTION     L and R eye  . CHOLECYSTECTOMY  2002  . HYDROCELE EXCISION Right 11/04/2016   Procedure: HYDROCELECTOMY ADULT;  Surgeon: Franchot Gallo, MD;  Location: [facility];  Service: Urology;  Laterality: Right;  . INGUINAL HERNIA REPAIR Bilateral 11/04/2016   Procedure: HERNIA REPAIR INGUINAL ADULT BILATERAL;  Surgeon: Jackolyn Confer, MD;  Location: [facility];  Service: General;  Laterality: Bilateral;  . INSERTION OF MESH Bilateral 11/04/2016   Procedure: INSERTION OF MESH;  Surgeon: Jackolyn Confer, MD;  Location: [facility];  Service: General;  Laterality: Bilateral;  . JOINT REPLACEMENT     Lt hip  . LEFT HEART CATH AND CORONARY ANGIOGRAPHY N/A 10/31/2017   Procedure: LEFT HEART CATH AND CORONARY ANGIOGRAPHY;  Surgeon: Troy Sine, MD;  Location: [facility];  Service: Cardiovascular;  Laterality: N/A;  . [place]  . NASAL SEPTUM SURGERY     sinus surgery  . PARATHYROIDECTOMY    . TOTAL HIP REVISION  06/14/2011   Procedure: TOTAL HIP REVISION;  Surgeon: Kerin Salen;  Location: [facility];  Service: Orthopedics;  Laterality: Left;  Left Acetabular  Hip Revision   Social History   Social History Narrative   Patient lives at home with his wife Arville Go) 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   Right handed.   Caffeine - one cup daily.   Immunization History  Administered Date(s) Administered  . Influenza Split 04/05/2015, 03/30/2019  . Influenza, High Dose Seasonal PF 03/30/2016  . PFIZER SARS-COV-2 Vaccination 08/12/2019, 09/05/2019  . Pneumococcal Polysaccharide-23 10/24/2007     Objective: Vital Signs: BP 122/74 (BP Location: Right Arm, Patient Position: Sitting, Cuff Size: Normal)   Pulse 91   Resp 15   Ht 5' 11.5" (1.816 m)    Wt 177 lb (80.3 kg)   BMI 24.34 kg/m    Physical Exam Vitals and nursing note reviewed.  Constitutional:      Appearance: He is well-developed.  HENT:     Head: Normocephalic and atraumatic.  Eyes:     Conjunctiva/sclera: Conjunctivae normal.     Pupils: Pupils are equal, round, and reactive to light.  Cardiovascular:     Rate and Rhythm: Normal rate and regular rhythm.     Heart sounds: Normal heart sounds.  Pulmonary:     Effort: Pulmonary effort is normal.     Breath sounds: Normal breath sounds.  Abdominal:     General: Bowel sounds are normal.     Palpations: Abdomen is soft.  Musculoskeletal:     Cervical back: Normal range of motion and neck supple.  Skin:    General: Skin is warm and dry.     Capillary Refill: Capillary refill takes less than 2 seconds.  Neurological:     Mental Status: He is alert and oriented to person, place, and time.  Psychiatric:        Behavior: Behavior normal.      Musculoskeletal Exam: Cervical spine-limited extension and flexion, limited right rotation.  He has thoracic kyphosis.  He has limited range of motion of his lumbar spine.  He had no SI joint tenderness.  Shoulder joints, elbow joints, wrist joints were in good range of motion.  He has DIP and PIP thickening but no synovitis was noted.  He has some discomfort with range of motion of his left hip joint which is replaced.  Knee joints ankles MTPs PIPs with good range of motion with some DIP and PIP thickening.  No synovitis was noted on the examination today.  CDAI Exam: CDAI Score: -- Patient Global: --; Provider Global: -- Swollen: --; Tender: -- Joint Exam 10/08/2019   No joint exam has been documented for this visit   There is currently no information documented on the homunculus. Go to the Rheumatology activity and complete the homunculus joint exam.  Investigation: No additional findings.  Imaging: No results found.  Recent Labs: Lab Results  Component Value Date    WBC 5.3 08/17/2019   HGB 15.4 08/17/2019   PLT 112 (L) 08/17/2019   NA 143 08/17/2019   K 3.9 08/17/2019   CL 106 08/17/2019   CO2 27 08/17/2019   GLUCOSE 103 (H) 08/17/2019   BUN 13 08/17/2019   CREATININE 1.01 08/17/2019   BILITOT 0.3 08/17/2019   ALKPHOS 84 08/17/2019  AST 25 08/17/2019   ALT 43 08/17/2019   PROT 6.9 08/17/2019   ALBUMIN 3.8 08/17/2019   CALCIUM 8.8 (L) 08/17/2019   GFRAA >60 08/17/2019    Speciality Comments: No specialty comments available.   Abnormal CT of the chest - 07/13/19 CT chest w/contrast showed mild GGO of the bilateral upper lobes and lesser degree the lower lobes, which was present on comparison CT of 2017 but now more conspicuous  - HSP panel positive for A. Pullulans; RF 575; sputum contaminated but showed budding yeast - Needs HRCT, PFTs with DLCO, ILD questionnaire, refer rheum/ILD clinic   Procedures:  No procedures performed Allergies: Ciprofloxacin hcl, Bupropion, Ciprofloxacin-dexamethasone, and Gabapentin   Assessment / Plan:     Visit Diagnoses: Interstitial pulmonary disease (HCC)-patient was diagnosed with interstitial lung disease and in January 2021.  He has been under care of Ms. Volanda Napoleon.  He states he has an appointment coming up with [name].  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  All previous records were reviewed.  CT scanning and high-resolution CT scan was reviewed.  Chronic respiratory failure with hypoxia (HCC)  Former smoker-patient gives history of chronic bronchitis.  He states he quit smoking more than 30 years ago.  Rheumatoid factor positive -he has significantly positive rheumatoid factor but has no synovitis on examination today.  08/17/19: RF 206.6, ESR 12.  I will check anti-CCP and '14 3 3 '$ eta.  Although the clinical exam does not indicate any synovitis.  Pain in both hands -he has a stiffness in his hands but no synovitis was noted.  Plan: XR Hand 2 View Right, XR Hand 2 View Left.  X-ray of bilateral  hands were consistent with osteoarthritis.  Pain in both feet -he gives history of paresthesias in his feet and some discomfort off and on but no swelling.  Plan: XR Foot 2 Views Right, XR Foot 2 Views Left.  X-ray of bilateral feet were consistent with osteoarthritis.  History of total left hip arthroplasty - due to a fall in 2004. Revision by Dr. Mayer Camel 2008.  He started having some discomfort in his left hip recently.  Neck pain -he has very limited range of motion of his cervical spine.  Plan: XR Cervical Spine 2 or 3 views.  X-rays were consistent with degenerative changes and facet joint arthropathy.  Given a handout on neck exercises.  Chronic midline low back pain without sciatica -he has thoracic kyphosis and also limited range of motion of his lumbar spine.  He gives history of chronic lower back pain.  Plan: XR Lumbar Spine 2-3 Views.  X-ray showed lumbar spine facet joint arthropathy.  Have given a handout on back exercises.  Other medical problems are listed as follows:  PVC's (premature ventricular contractions) - followed by dr. Acie Fredrickson.  Mobitz type 1 second degree atrioventricular block  Hyperparathyroidism, primary, s/p MI parathyroidectomy 7/18, 3.1 gm adenoma  Barrett's esophagus without dysplasia - followed by [facility].  History of gastroesophageal reflux (GERD)  Ileus ([place]) - X2  Orders: Orders Placed This Encounter  Procedures  . XR Cervical Spine 2 or 3 views  . XR Hand 2 View Right  . XR Hand 2 View Left  . XR Lumbar Spine 2-3 Views  . XR Foot 2 Views Right  . XR Foot 2 Views Left   No orders of the defined types were placed in this encounter.   Face-to-face time spent with patient was 50 minutes. Greater than 50% of time was spent  in counseling and coordination of care.  Follow-Up Instructions: Return for ILD, +RF.   Bo Merino, MD  Note - This record has been created using Editor, commissioning.  Chart creation errors have been sought, but may not  always  have been located. Such creation errors do not reflect on  the standard of medical care.

## 2019-10-08 ENCOUNTER — Ambulatory Visit: Payer: Self-pay

## 2019-10-08 ENCOUNTER — Encounter: Payer: Self-pay | Admitting: Rheumatology

## 2019-10-08 ENCOUNTER — Other Ambulatory Visit: Payer: Self-pay

## 2019-10-08 ENCOUNTER — Ambulatory Visit (INDEPENDENT_AMBULATORY_CARE_PROVIDER_SITE_OTHER): Payer: Medicare Other | Admitting: Rheumatology

## 2019-10-08 VITALS — BP 122/74 | HR 91 | Resp 15 | Ht 71.5 in | Wt 177.0 lb

## 2019-10-08 DIAGNOSIS — I493 Ventricular premature depolarization: Secondary | ICD-10-CM

## 2019-10-08 DIAGNOSIS — Z8719 Personal history of other diseases of the digestive system: Secondary | ICD-10-CM

## 2019-10-08 DIAGNOSIS — G8929 Other chronic pain: Secondary | ICD-10-CM | POA: Diagnosis not present

## 2019-10-08 DIAGNOSIS — Z87891 Personal history of nicotine dependence: Secondary | ICD-10-CM

## 2019-10-08 DIAGNOSIS — I441 Atrioventricular block, second degree: Secondary | ICD-10-CM | POA: Diagnosis not present

## 2019-10-08 DIAGNOSIS — M79642 Pain in left hand: Secondary | ICD-10-CM | POA: Diagnosis not present

## 2019-10-08 DIAGNOSIS — J849 Interstitial pulmonary disease, unspecified: Secondary | ICD-10-CM

## 2019-10-08 DIAGNOSIS — M542 Cervicalgia: Secondary | ICD-10-CM | POA: Diagnosis not present

## 2019-10-08 DIAGNOSIS — J9611 Chronic respiratory failure with hypoxia: Secondary | ICD-10-CM | POA: Diagnosis not present

## 2019-10-08 DIAGNOSIS — Z96642 Presence of left artificial hip joint: Secondary | ICD-10-CM

## 2019-10-08 DIAGNOSIS — E210 Primary hyperparathyroidism: Secondary | ICD-10-CM

## 2019-10-08 DIAGNOSIS — M79671 Pain in right foot: Secondary | ICD-10-CM

## 2019-10-08 DIAGNOSIS — M79641 Pain in right hand: Secondary | ICD-10-CM

## 2019-10-08 DIAGNOSIS — K567 Ileus, unspecified: Secondary | ICD-10-CM

## 2019-10-08 DIAGNOSIS — K2270 Barrett's esophagus without dysplasia: Secondary | ICD-10-CM

## 2019-10-08 DIAGNOSIS — M545 Low back pain: Secondary | ICD-10-CM

## 2019-10-08 DIAGNOSIS — R768 Other specified abnormal immunological findings in serum: Secondary | ICD-10-CM | POA: Diagnosis not present

## 2019-10-08 DIAGNOSIS — M79672 Pain in left foot: Secondary | ICD-10-CM

## 2019-10-08 NOTE — Patient Instructions (Signed)
Cervical Strain and Sprain Rehab Ask your health care provider which exercises are safe for you. Do exercises exactly as told by your health care provider and adjust them as directed. It is normal to feel mild stretching, pulling, tightness, or discomfort as you do these exercises. Stop right away if you feel sudden pain or your pain gets worse. Do not begin these exercises until told by your health care provider. Stretching and range-of-motion exercises Cervical side bending  1. Using good posture, sit on a stable chair or stand up. 2. Without moving your shoulders, slowly tilt your left / right ear to your shoulder until you feel a stretch in the opposite side neck muscles. You should be looking straight ahead. 3. Hold for __________ seconds. 4. Repeat with the other side of your neck. Repeat __________ times. Complete this exercise __________ times a day. Cervical rotation  1. Using good posture, sit on a stable chair or stand up. 2. Slowly turn your head to the side as if you are looking over your left / right shoulder. ? Keep your eyes level with the ground. ? Stop when you feel a stretch along the side and the back of your neck. 3. Hold for __________ seconds. 4. Repeat this by turning to your other side. Repeat __________ times. Complete this exercise __________ times a day. Thoracic extension and pectoral stretch 1. Roll a towel or a small blanket so it is about 4 inches (10 cm) in diameter. 2. Lie down on your back on a firm surface. 3. Put the towel lengthwise, under your spine in the middle of your back. It should not be under your shoulder blades. The towel should line up with your spine from your middle back to your lower back. 4. Put your hands behind your head and let your elbows fall out to your sides. 5. Hold for __________ seconds. Repeat __________ times. Complete this exercise __________ times a day. Strengthening exercises Isometric upper cervical flexion 1. Lie on  your back with a thin pillow behind your head and a small rolled-up towel under your neck. 2. Gently tuck your chin toward your chest and nod your head down to look toward your feet. Do not lift your head off the pillow. 3. Hold for __________ seconds. 4. Release the tension slowly. Relax your neck muscles completely before you repeat this exercise. Repeat __________ times. Complete this exercise __________ times a day. Isometric cervical extension  1. Stand about 6 inches (15 cm) away from a wall, with your back facing the wall. 2. Place a soft object, about 6-8 inches (15-20 cm) in diameter, between the back of your head and the wall. A soft object could be a small pillow, a ball, or a folded towel. 3. Gently tilt your head back and press into the soft object. Keep your jaw and forehead relaxed. 4. Hold for __________ seconds. 5. Release the tension slowly. Relax your neck muscles completely before you repeat this exercise. Repeat __________ times. Complete this exercise __________ times a day. Posture and body mechanics Body mechanics refers to the movements and positions of your body while you do your daily activities. Posture is part of body mechanics. Good posture and healthy body mechanics can help to relieve stress in your body's tissues and joints. Good posture means that your spine is in its natural S-curve position (your spine is neutral), your shoulders are pulled back slightly, and your head is not tipped forward. The following are general guidelines for applying improved   posture and body mechanics to your everyday activities. Sitting  1. When sitting, keep your spine neutral and keep your feet flat on the floor. Use a footrest, if necessary, and keep your thighs parallel to the floor. Avoid rounding your shoulders, and avoid tilting your head forward. 2. When working at a desk or a computer, keep your desk at a height where your hands are slightly lower than your elbows. Slide your  chair under your desk so you are close enough to maintain good posture. 3. When working at a computer, place your monitor at a height where you are looking straight ahead and you do not have to tilt your head forward or downward to look at the screen. Standing   When standing, keep your spine neutral and keep your feet about hip-width apart. Keep a slight bend in your knees. Your ears, shoulders, and hips should line up.  When you do a task in which you stand in one place for a long time, place one foot up on a stable object that is 2-4 inches (5-10 cm) high, such as a footstool. This helps keep your spine neutral. Resting When lying down and resting, avoid positions that are most painful for you. Try to support your neck in a neutral position. You can use a contour pillow or a small rolled-up towel. Your pillow should support your neck but not push on it. This information is not intended to replace advice given to you by your health care provider. Make sure you discuss any questions you have with your health care provider. Document Revised: 10/11/2018 Document Reviewed: 03/22/2018 Elsevier Patient Education  2020 [employer]  Back Exercises The following exercises strengthen the muscles that help to support the trunk and back. They also help to keep the lower back flexible. Doing these exercises can help to prevent back pain or lessen existing pain.  If you have back pain or discomfort, try doing these exercises 2-3 times each day or as told by your health care provider.  As your pain improves, do them once each day, but increase the number of times that you repeat the steps for each exercise (do more repetitions).  To prevent the recurrence of back pain, continue to do these exercises once each day or as told by your health care provider. Do exercises exactly as told by your health care provider and adjust them as directed. It is normal to feel mild stretching, pulling, tightness, or  discomfort as you do these exercises, but you should stop right away if you feel sudden pain or your pain gets worse. Exercises Single knee to chest Repeat these steps 3-5 times for each leg: 1. Lie on your back on a firm bed or the floor with your legs extended. 2. Bring one knee to your chest. Your other leg should stay extended and in contact with the floor. 3. Hold your knee in place by grabbing your knee or thigh with both hands and hold. 4. Pull on your knee until you feel a gentle stretch in your lower back or buttocks. 5. Hold the stretch for 10-30 seconds. 6. Slowly release and straighten your leg. Pelvic tilt Repeat these steps 5-10 times: 1. Lie on your back on a firm bed or the floor with your legs extended. 2. Bend your knees so they are pointing toward the ceiling and your feet are flat on the floor. 3. Tighten your lower abdominal muscles to press your lower back against the floor. This motion will   tilt your pelvis so your tailbone points up toward the ceiling instead of pointing to your feet or the floor. 4. With gentle tension and even breathing, hold this position for 5-10 seconds. Cat-cow Repeat these steps until your lower back becomes more flexible: 1. Get into a hands-and-knees position on a firm surface. Keep your hands under your shoulders, and keep your knees under your hips. You may place padding under your knees for comfort. 2. Let your head hang down toward your chest. Contract your abdominal muscles and point your tailbone toward the floor so your lower back becomes rounded like the back of a cat. 3. Hold this position for 5 seconds. 4. Slowly lift your head, let your abdominal muscles relax and point your tailbone up toward the ceiling so your back forms a sagging arch like the back of a cow. 5. Hold this position for 5 seconds.  Press-ups Repeat these steps 5-10 times: 1. Lie on your abdomen (face-down) on the floor. 2. Place your palms near your head, about  shoulder-width apart. 3. Keeping your back as relaxed as possible and keeping your hips on the floor, slowly straighten your arms to raise the top half of your body and lift your shoulders. Do not use your back muscles to raise your upper torso. You may adjust the placement of your hands to make yourself more comfortable. 4. Hold this position for 5 seconds while you keep your back relaxed. 5. Slowly return to lying flat on the floor.  Bridges Repeat these steps 10 times: 1. Lie on your back on a firm surface. 2. Bend your knees so they are pointing toward the ceiling and your feet are flat on the floor. Your arms should be flat at your sides, next to your body. 3. Tighten your buttocks muscles and lift your buttocks off the floor until your waist is at almost the same height as your knees. You should feel the muscles working in your buttocks and the back of your thighs. If you do not feel these muscles, slide your feet 1-2 inches farther away from your buttocks. 4. Hold this position for 3-5 seconds. 5. Slowly lower your hips to the starting position, and allow your buttocks muscles to relax completely. If this exercise is too easy, try doing it with your arms crossed over your chest. Abdominal crunches Repeat these steps 5-10 times: 1. Lie on your back on a firm bed or the floor with your legs extended. 2. Bend your knees so they are pointing toward the ceiling and your feet are flat on the floor. 3. Cross your arms over your chest. 4. Tip your chin slightly toward your chest without bending your neck. 5. Tighten your abdominal muscles and slowly raise your trunk (torso) high enough to lift your shoulder blades a tiny bit off the floor. Avoid raising your torso higher than that because it can put too much stress on your low back and does not help to strengthen your abdominal muscles. 6. Slowly return to your starting position. Back lifts Repeat these steps 5-10 times: 1. Lie on your abdomen  (face-down) with your arms at your sides, and rest your forehead on the floor. 2. Tighten the muscles in your legs and your buttocks. 3. Slowly lift your chest off the floor while you keep your hips pressed to the floor. Keep the back of your head in line with the curve in your back. Your eyes should be looking at the floor. 4. Hold this position for   3-5 seconds. 5. Slowly return to your starting position. Contact a health care provider if:  Your back pain or discomfort gets much worse when you do an exercise.  Your worsening back pain or discomfort does not lessen within 2 hours after you exercise. If you have any of these problems, stop doing these exercises right away. Do not do them again unless your health care provider says that you can. Get help right away if:  You develop sudden, severe back pain. If this happens, stop doing the exercises right away. Do not do them again unless your health care provider says that you can. This information is not intended to replace advice given to you by your health care provider. Make sure you discuss any questions you have with your health care provider. Document Revised: 10/26/2018 Document Reviewed: 03/23/2018 Elsevier Patient Education  2020 [employer]  

## 2019-10-12 LAB — 14-3-3 ETA PROTEIN: 14-3-3 eta Protein: <0.2 ng/mL (ref <0.2)

## 2019-10-12 LAB — CYCLIC CITRUL PEPTIDE ANTIBODY, IGG: Cyclic Citrullin Peptide Ab: <16 UNITS

## 2019-10-14 NOTE — Progress Notes (Signed)
Labs are WNLs. I will discuss results at the fu visit.

## 2019-10-23 ENCOUNTER — Ambulatory Visit: Payer: Medicare Other | Admitting: Physician Assistant

## 2019-10-23 NOTE — Progress Notes (Signed)
Cardiology Office Note:    Date:  10/24/2019   ID:  [name], DOB 1951-07-02, MRN [id number]  PCP:  Jonathon Jordan, MD  Cardiologist:  Mertie Moores, MD   Electrophysiologist:  None   Referring MD: Jonathon Jordan, MD   Chief Complaint:  Follow-up (CAD), Shortness of Breath, and Leg Swelling    Patient Profile:    [name] is a 69 y.o. male with:   Coronary artery disease   Mild non-obstructive dz by cath 4/19  GERD/Barrett's Esophagus/Hiatal Hernia  FHx of CAD  Hyperlipidemia   Interstitial lung disease    Prior CV studies: Carotid US 01/10/2019 Bilateral ICA 1-39  Echocardiogram 01/10/2019 EF 55-60, GR 1 DD, no RWMA, normal RV SF  Cardiac catheterization 10/31/2017 LAD ostial 20, proximal 10 LCx ectatic RCA proximal 20, distal 30,  Event monitor 03/2016 Normal sinus rhythm, occasional PACs, rare atrial runs  ETT 05/23/15 Normal   History of Present Illness:    [name] was last seen by Dr. Acie Fredrickson in September 2020.  He noted episodes of tachycardia with minimal exertion.  He was placed on low-dose metoprolol succinate.  He returns for follow-up.  Since last seen, he has been diagnosed with interstitial lung disease.  He has seen pulmonology.  He was also referred to rheumatology for an elevated rheumatoid factor.  Over the last few months, he has noted lower extremity swelling.  This seems to resolve when he elevates his legs.  He has been short of breath with activity.  He attributes this to his lung disease.  He has not had chest discomfort, syncope.     Past Medical History:  Diagnosis Date  . Arthritis   . Bronchitis    history of  . 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 GERD (gastroesophageal reflux disease)   . Hiatal hernia   . 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 Hypercalcemia   . Lipoma    left forearm (3)  . Mitral valve prolapse    does not see cardiologist for. last stress test 2003  . Pneumonia 2009  . Tumor cells,  benign    lung, left side  . Unstable angina ([place]) 10/28/2017    Current Medications: Current Meds  Medication Sig  . aspirin 81 MG chewable tablet Chew 81 mg by mouth daily.  . famotidine (PEPCID) 20 MG tablet Take 1 tablet (20 mg total) by mouth at bedtime.  Marland Kitchen FLUoxetine (PROZAC) 40 MG capsule Take 40 mg by mouth every morning.   . methylphenidate (RITALIN) 10 MG tablet Take 10 mg by mouth 3 (three) times daily.    . metoprolol succinate (TOPROL XL) 25 MG 24 hr tablet Take 1 tablet (25 mg total) by mouth daily.  . mirtazapine (REMERON) 15 MG tablet Take 15 mg by mouth at bedtime.  . Multiple Vitamin (MULTIVITAMIN) tablet Take 1 tablet by mouth daily.  . nitroGLYCERIN (NITROSTAT) 0.4 MG SL tablet Place 0.4 mg under the tongue every 5 (five) minutes as needed for chest pain.   Marland Kitchen omeprazole (PRILOSEC) 20 MG capsule Take 20 mg by mouth 2 (two) times daily before a meal.   . polyethylene glycol (MIRALAX / GLYCOLAX) packet Take 17 g by mouth daily as needed (for constipation.).   Marland Kitchen rosuvastatin (CRESTOR) 10 MG tablet TAKE 1 TABLET(10 MG) BY MOUTH DAILY  . SYNTHROID 25 MCG tablet Take 25 mcg by mouth daily before breakfast.   . tamsulosin (FLOMAX) 0.4 MG CAPS capsule  Take 0.4 mg by mouth daily after breakfast.      Allergies:   Bupropion, Ciprofloxacin-dexamethasone, Gabapentin, and Ciprofloxacin hcl   Social History   Tobacco Use  . Smoking status: Former Smoker    Packs/day: 1.50    Years: 24.00    Pack years: 36.00    Types: Cigarettes    Quit date: 07/06/1987    Years since quitting: 32.3  . Smokeless tobacco: Never Used  Substance Use Topics  . Alcohol use: Yes    Comment: occ  . Drug use: No     Family Hx: The patient's family history includes Cancer in his brother; Healthy in his daughter, daughter, and son; Heart Problems in his father and mother; Lung cancer (age of onset: 15) in his father; Neuropathy in his sister.  ROS   EKGs/Labs/Other Test Reviewed:    EKG:  EKG  is not ordered today.  The ekg ordered today demonstrates n/a  Recent Labs: 12/29/2018: TSH 5.679 06/19/2019: B Natriuretic Peptide 42.2 08/17/2019: ALT 43; BUN 13; Creatinine 1.01; Hemoglobin 15.4; Platelet Count 112; Potassium 3.9; Sodium 143   Recent Lipid Panel Lab Results  Component Value Date/Time   CHOL 134 02/13/2018 08:51 AM   TRIG 137 02/13/2018 08:51 AM   HDL 41 02/13/2018 08:51 AM   CHOLHDL 3.3 02/13/2018 08:51 AM   CHOLHDL 3.4 10/29/2017 03:52 AM   LDLCALC 66 02/13/2018 08:51 AM    Physical Exam:    VS:  BP 98/62   Pulse 90   Ht 5\' 11"  (1.803 m)   Wt 177 lb (80.3 kg)   SpO2 97%   BMI 24.69 kg/m     Wt Readings from Last 3 Encounters:  10/24/19 177 lb (80.3 kg)  10/08/19 177 lb (80.3 kg)  08/17/19 172 lb 3.2 oz (78.1 kg)     Constitutional:      Appearance: Healthy appearance. Not in distress.  Neck:     Thyroid: No thyromegaly.     Vascular: JVD normal.  Pulmonary:     Breath sounds: No wheezing. No rales.  Cardiovascular:     Normal rate. Regular rhythm. Normal S1. Normal S2.     Murmurs: There is no murmur.  Edema:    Peripheral edema absent.  Abdominal:     Palpations: Abdomen is soft. There is no hepatomegaly.  Skin:    General: Skin is warm and dry.  Neurological:     General: No focal deficit present.     Mental Status: Alert and oriented to person, place and time.       ASSESSMENT & PLAN:    1. Shortness of breath 2. Interstitial lung disease ([place]) As noted, he is being evaluated by pulmonology for interstitial lung disease.  His CT scan in February demonstrated evidence of bronchiectasis as well as possible early interstitial lung disease.  He was referred to pulmonology for positive rheumatoid factor.  He does note some swelling in his legs.  This does resolve with elevation.  He has been more short of breath but this seems to be related to his lung issues.  However, I recommend that we obtain an echocardiogram to reevaluate his LV  function as well as his RV function and pulmonary pressures.  I will also obtain a BNP.  If his BNP is significantly elevated, I will add low-dose furosemide.  3. Coronary artery disease involving native coronary artery of native heart without angina pectoris Minimal non-obstructive coronary artery disease by cardiac catheterization 2019.  He is  not having any symptoms of angina.  Continue aspirin, beta-blocker, rosuvastatin.  4. Tachycardia His symptoms of tachycardia are improved on beta-blocker therapy.  Continue metoprolol succinate.    Dispo:  Return in about 6 months (around 04/24/2020) for Routine Follow Up, w/ Dr. Acie Fredrickson, in person.   Medication Adjustments/Labs and Tests Ordered: Current medicines are reviewed at length with the patient today.  Concerns regarding medicines are outlined above.  Tests Ordered: Orders Placed This Encounter  Procedures  . Pro b natriuretic peptide (BNP)  . ECHOCARDIOGRAM COMPLETE   Medication Changes: No orders of the defined types were placed in this encounter.   Signed, Richardson Dopp, PA-C  10/24/2019 11:14 AM    [facility] [address], [place], [place]  [address] Phone: [phone]; Fax: [phone]

## 2019-10-24 ENCOUNTER — Ambulatory Visit (INDEPENDENT_AMBULATORY_CARE_PROVIDER_SITE_OTHER): Payer: Medicare Other | Admitting: Physician Assistant

## 2019-10-24 ENCOUNTER — Other Ambulatory Visit: Payer: Self-pay

## 2019-10-24 ENCOUNTER — Encounter: Payer: Self-pay | Admitting: Physician Assistant

## 2019-10-24 VITALS — BP 98/62 | HR 90 | Ht 71.0 in | Wt 177.0 lb

## 2019-10-24 DIAGNOSIS — I2510 Atherosclerotic heart disease of native coronary artery without angina pectoris: Secondary | ICD-10-CM

## 2019-10-24 DIAGNOSIS — R0602 Shortness of breath: Secondary | ICD-10-CM

## 2019-10-24 DIAGNOSIS — R000 Tachycardia, unspecified: Secondary | ICD-10-CM

## 2019-10-24 DIAGNOSIS — J849 Interstitial pulmonary disease, unspecified: Secondary | ICD-10-CM | POA: Diagnosis not present

## 2019-10-24 DIAGNOSIS — E782 Mixed hyperlipidemia: Secondary | ICD-10-CM

## 2019-10-24 NOTE — Patient Instructions (Signed)
Medication Instructions:  none *If you need a refill on your cardiac medications before your next appointment, please call your pharmacy*   Lab Work:  TODAY PRO BNP If you have labs (blood work) drawn today and your tests are completely normal, you will receive your results only by: Marland Kitchen MyChart Message (if you have MyChart) OR . A paper copy in the mail If you have any lab test that is abnormal or we need to change your treatment, we will call you to review the results.   Testing/Procedures:  Please schedule Your physician has requested that you have an echocardiogram. Echocardiography is a painless test that uses sound waves to create images of your heart. It provides your doctor with information about the size and shape of your heart and how well your heart's chambers and valves are working. This procedure takes approximately one hour. There are no restrictions for this procedure.     Follow-Up: At [facility], you and your health needs are our priority.  As part of our continuing mission to provide you with exceptional heart care, we have created designated Provider Care Teams.  These Care Teams include your primary Cardiologist (physician) and Advanced Practice Providers (APPs -  Physician Assistants and Nurse Practitioners) who all work together to provide you with the care you need, when you need it.   Your next appointment:   6 month(s)  The format for your next appointment:   In Person  Provider:   Dr Acie Fredrickson   Other Instructions Get Over the Counter Above knee compression stockings

## 2019-10-25 LAB — PRO B NATRIURETIC PEPTIDE: NT-Pro BNP: 71 pg/mL (ref 0–376)

## 2019-10-28 NOTE — Progress Notes (Deleted)
Office Visit Note  Patient: [name]             Date of Birth: Nov 07, 1950           MRN: [id number]             PCP: Jonathon Jordan, MD Referring: Jonathon Jordan, MD Visit Date: 11/05/2019 Occupation: '@GUAROCC' @  Subjective:  No chief complaint on file.   History of Present Illness: [name] is a 69 y.o. male ***   Activities of Daily Living:  Patient reports morning stiffness for *** {minute/hour:19697}.   Patient {ACTIONS;DENIES/REPORTS:21021675::"Denies"} nocturnal pain.  Difficulty dressing/grooming: {ACTIONS;DENIES/REPORTS:21021675::"Denies"} Difficulty climbing stairs: {ACTIONS;DENIES/REPORTS:21021675::"Denies"} Difficulty getting out of chair: {ACTIONS;DENIES/REPORTS:21021675::"Denies"} Difficulty using hands for taps, buttons, cutlery, and/or writing: {ACTIONS;DENIES/REPORTS:21021675::"Denies"}  No Rheumatology ROS completed.   PMFS History:  Patient Active Problem List   Diagnosis Date Noted  . Abnormal CT of the chest 07/30/2019  . Elevated rheumatoid factor 07/30/2019  . Oral candidiasis 07/30/2019  . Sinus tachycardia 04/03/2019  . Confusion 01/02/2019  . Abnormal EKG-dynamic TWI 10/31/2017  . PVC's (premature ventricular contractions) 10/31/2017  . Family history of coronary artery disease in father 10/31/2017  . Unstable angina ([place]) 10/28/2017  . Bilateral inguinal hernia 11/04/2016  . Mobitz type 1 second degree atrioventricular block 03/16/2016  . Syncope 03/15/2016  . Malnutrition of moderate degree 10/21/2015  . Hyperkinetic disorder   . Choreiform movements 10/20/2015  . Chronic respiratory failure with hypoxia ([place]) 09/14/2015  . Cough   . Acute respiratory distress 09/04/2015  . Barrett's esophagus 09/04/2015  . Dyspnea on exertion 09/04/2015  . Influenza   . CAP (community acquired pneumonia) 09/03/2015  . Upper airway cough syndrome 08/21/2015  . Chest pain with moderate risk of acute coronary syndrome 05/13/2015  .  Bilateral inguinal hernia (BIH) 01/08/2014  . Ileus ([place]) 08/18/2012  . Pain due to Left hip joint prosthesis 06/13/2011  . Hyperparathyroidism, primary, s/p MI parathyroidectomy 7/18, 3.1 gm adenoma 02/19/2011  . LIPOMAS, MULTIPLE 10/24/2007  . HLD (hyperlipidemia) 10/24/2007  . Depression 10/24/2007  . ADD (attention deficit disorder) 10/24/2007  . OTITIS MEDIA 10/24/2007  . SINUSITIS, CHRONIC 10/24/2007  . PNEUMONIA, RECURRENT 10/24/2007  . HIP FRACTURE, LEFT 10/24/2007  . GENITAL HERPES, HX OF 10/24/2007  . TOBACCO USE, QUIT 10/24/2007  . HIP REPLACEMENT, TOTAL, HX OF 10/24/2007  . Other acquired absence of organ 10/24/2007  . G E R D 07/13/2007    Past Medical History:  Diagnosis Date  . Arthritis   . Bronchitis    history of  . 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 GERD (gastroesophageal reflux disease)   . Hiatal hernia   . 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 Hypercalcemia   . Lipoma    left forearm (3)  . Mitral valve prolapse    does not see cardiologist for. last stress test 2003  . Pneumonia 2009  . Tumor cells, benign    lung, left side  . Unstable angina ([place]) 10/28/2017    Family History  Problem Relation Age of Onset  . Lung cancer Father 2       smoked  . Heart Problems Father   . Cancer Brother        prostate  . Heart Problems Mother   . Neuropathy Sister   . Healthy Son   . Healthy Daughter   . Healthy Daughter    Past Surgical History:  Procedure Laterality Date  . APPENDECTOMY  1984  .  CATARACT EXTRACTION     L and R eye  . CHOLECYSTECTOMY  2002  . HYDROCELE EXCISION Right 11/04/2016   Procedure: HYDROCELECTOMY ADULT;  Surgeon: Franchot Gallo, MD;  [facility]: [facility];  Service: Urology;  Laterality: Right;  . INGUINAL HERNIA REPAIR Bilateral 11/04/2016   Procedure: HERNIA REPAIR INGUINAL ADULT BILATERAL;  Surgeon: Jackolyn Confer, MD;  [facility];  Service: General;  Laterality: Bilateral;  . INSERTION OF MESH Bilateral 11/04/2016     Procedure: INSERTION OF MESH;  Surgeon: Jackolyn Confer, MD;  [facility];  Service: General;  Laterality: Bilateral;  . JOINT REPLACEMENT     Lt hip  . LEFT HEART CATH AND CORONARY ANGIOGRAPHY N/A 10/31/2017   Procedure: LEFT HEART CATH AND CORONARY ANGIOGRAPHY;  Surgeon: Troy Sine, MD;  Location: [facility];  Service: Cardiovascular;  Laterality: N/A;  . MASTOIDECTOMY  1996  . NASAL SEPTUM SURGERY     sinus surgery  . PARATHYROIDECTOMY    . 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   Right handed.   Caffeine - one cup daily.   Immunization History  Administered Date(s) Administered  . Influenza Split 04/05/2015, 03/30/2019  . Influenza, High Dose Seasonal PF 03/30/2016  . PFIZER SARS-COV-2 Vaccination 08/12/2019, 09/05/2019  . Pneumococcal Polysaccharide-23 10/24/2007     Objective: Vital Signs: There were no vitals taken for this visit.   Physical Exam   Musculoskeletal Exam: ***  CDAI Exam: CDAI Score: -- Patient Global: --; Provider Global: -- Swollen: --; Tender: -- Joint Exam 11/05/2019   No joint exam has been documented for this visit   There is currently no information documented on the homunculus. Go to the Rheumatology activity and complete the homunculus joint exam.  Investigation: No additional findings.  Imaging: XR Cervical Spine 2 or 3 views  Result Date: 10/08/2019 C5-C6 and C6-C7 narrowing was noted.  Facet joint narrowing was noted.  Anterior spurring was noted at C5-C6 and C6-C7. Impression: These findings are consistent with cervical spondylosis and facet joint arthropathy.  XR Foot 2 Views Left  Result Date: 10/08/2019 First MTP, PIP and DIP  narrowing was noted.  No intertarsal or tibiotalar joint space narrowing was noted.  No erosive changes were noted.  Small posterior calcaneal spur was noted. Impression: These findings are consistent with osteoarthritis of the foot.  XR Foot 2 Views Right  Result Date: 10/08/2019 First MTP, PIP and DIP narrowing was noted.  No intertarsal or tibiotalar joint space narrowing was noted.  Small calcaneal spur was noted. Impression: These findings are consistent with osteoarthritis of the foot.  XR Hand 2 View Left  Result Date: 10/08/2019 PIP and DIP narrowing was noted.  No MCP, intercarpal radiocarpal joint space narrowing was noted.  No erosive changes were noted. Impression: These findings are consistent with osteoarthritis of the hand.  XR Hand 2 View Right  Result Date: 10/08/2019 PIP and DIP narrowing was noted.  No MCP, intercarpal radiocarpal joint space narrowing was noted.  No erosive changes were noted. Impression: These findings are consistent with osteoarthritis of the hand.  XR Lumbar Spine 2-3 Views  Result Date: 10/08/2019 No significant disc space narrowing was noted.  Facet joint arthropathy was noted.  Degenerative changes were noted in thoracic vertebrae.  Impression: These findings are consistent with facet joint arthropathy of the lumbar spine.    Recent Labs: Lab Results  Component Value Date   WBC 5.3 08/17/2019   HGB 15.4 08/17/2019   PLT 112 (L) 08/17/2019   NA 143 08/17/2019   K 3.9 08/17/2019   CL 106 08/17/2019   CO2 27 08/17/2019   GLUCOSE 103 (H) 08/17/2019   BUN 13 08/17/2019   CREATININE 1.01 08/17/2019   BILITOT 0.3 08/17/2019   ALKPHOS 84 08/17/2019   AST 25 08/17/2019   ALT 43 08/17/2019   PROT 6.9 08/17/2019   ALBUMIN 3.8 08/17/2019   CALCIUM 8.8 (L) 08/17/2019   GFRAA >60 08/17/2019   October 08, 2019 anti-CCP negative, '14 3 3 ' eta negative  08/17/19: RF 206.6, ESR 12.   Speciality Comments: No specialty comments available.  Procedures:  No  procedures performed Allergies: Bupropion, Ciprofloxacin-dexamethasone, Gabapentin, and Ciprofloxacin hcl   Assessment / Plan:     Visit Diagnoses: No diagnosis found.  Orders: No orders of the defined types were placed in this encounter.  No orders of the defined types were placed in this encounter.   Face-to-face time spent with patient was *** minutes. Greater than 50% of time was spent in counseling and coordination of care.  Follow-Up Instructions: No follow-ups on file.   Bo Merino, MD  Note - This record has been created using Editor, commissioning.  Chart creation errors have been sought, but may not always  have been located. Such creation errors do not reflect on  the standard of medical care.

## 2019-10-29 ENCOUNTER — Encounter (HOSPITAL_COMMUNITY): Payer: Self-pay | Admitting: *Deleted

## 2019-10-29 ENCOUNTER — Other Ambulatory Visit: Payer: Self-pay

## 2019-10-29 ENCOUNTER — Ambulatory Visit (INDEPENDENT_AMBULATORY_CARE_PROVIDER_SITE_OTHER): Payer: Medicare Other | Admitting: Internal Medicine

## 2019-10-29 ENCOUNTER — Encounter: Payer: Self-pay | Admitting: Internal Medicine

## 2019-10-29 VITALS — BP 118/74 | HR 64 | Ht 71.0 in | Wt 174.6 lb

## 2019-10-29 DIAGNOSIS — I2510 Atherosclerotic heart disease of native coronary artery without angina pectoris: Secondary | ICD-10-CM | POA: Diagnosis not present

## 2019-10-29 DIAGNOSIS — R0600 Dyspnea, unspecified: Secondary | ICD-10-CM

## 2019-10-29 DIAGNOSIS — J439 Emphysema, unspecified: Secondary | ICD-10-CM | POA: Diagnosis not present

## 2019-10-29 DIAGNOSIS — R0609 Other forms of dyspnea: Secondary | ICD-10-CM

## 2019-10-29 DIAGNOSIS — J849 Interstitial pulmonary disease, unspecified: Secondary | ICD-10-CM

## 2019-10-29 MED ORDER — SPIRIVA RESPIMAT 2.5 MCG/ACT IN AERS: 2.0000 | INHALATION_SPRAY | Freq: Every day | RESPIRATORY_TRACT | 0 refills | Status: DC

## 2019-10-29 MED ORDER — SPIRIVA RESPIMAT 2.5 MCG/ACT IN AERS: 2.0000 | INHALATION_SPRAY | Freq: Every day | RESPIRATORY_TRACT | 11 refills | Status: DC

## 2019-10-29 NOTE — Progress Notes (Signed)
OV 10/29/2019  Subjective:  Patient ID: [name], male , DOB: 05/12/1951 , age 69 y.o. , MRN: [id number] , ADDRESS: [address] [place] [address]   10/29/2019 -   Chief Complaint  Patient presents with  . Follow-up    Pt saw Dr. Halford Chessman 3/23 and Dr. Halford Chessman wanted pt to f/u with MR.  Pt states he has been doing good since last visit. Denies any current complaints with breathing.      HPI [name] 69 y.o. -has been referred to the ILD center.  For evaluation of his interstitial lung disease.  He is a former Neurosurgeon.  He tells me that a few years ago he is to be able to walk 3 miles but now has restricted himself to 1-> 1-1/2 miles.  His work-up resulted in a positive rheumatoid factor documented below.  He was referred to rheumatology.  He does not recollect who he saw but he tells me that there is no evidence of rheumatoid arthritis but he has osteoarthritis.  He is a previous smoker having quit 30 years ago.  But more than 30 pack smoking history.  He had a high-resolution CT chest that has both upper lobe and lower lobe findings.  The lower lobe findings are indeterminate for UIP.  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  There is also associated emphysema.  I personally visualized the images.  He had pulmonary function test that shows restriction with low DLCO.   [facility] Comprehensive ILD Questionnaire - he did one and gave it to NP in feb 2021 who then gave to [place] but currently cannot find it. So, he agreed to do a redo . This is pending currently  Symptoms:  See below   SYMPTOM SCALE - ILD 10/29/2019   O2 use ra  Shortness of Breath 0 -> 5 scale with 5 being worst (score 6 If unable to do)  At rest 1  Simple tasks - showers, clothes change, eating, shaving 1  Household (dishes, doing bed, laundry) 2  Shopping 1.5  Walking level at own pace 1  Walking up Stairs 2  Total (30-36) Dyspnea Score 8.5   How bad is your cough? 0  How bad is your fatigue 1  How bad is nausea 0  How bad is vomiting?  0  How bad is diarrhea? 0  How bad is anxiety? 0  How bad is depression 0        Past Medical History :    ROS:  pending   FAMILY HISTORY of LUNG DISEASE: pending   EXPOSURE HISTORY: pending   HOME and HOBBY DETAILS :  pending   OCCUPATIONAL HISTORY (122 questions) : pending   PULMONARY TOXICITY HISTORY (27 items): pending        Simple office walk 185 feet x  3 laps goal with forehead probe 10/29/2019   O2 used ra  Number laps completed 3  Comments about pace moderate  Resting Pulse Ox/HR 99% and 80/min  Final Pulse Ox/HR 99% and 93/min  Desaturated </= 88% no  Desaturated <= 3% points no  Got Tachycardic >/= 90/min yes  Symptoms at end of test Mild dyspena  Miscellaneous comments x    IMPRESSION: HRCT 1. Mild, tubular bronchiectasis and bronchial wall thickening throughout, with very fine, predominantly peripheral centrilobular nodularity in the upper lungs, similar to prior examination. There is additional, minimal bibasilar irregular interstitial opacity,  which is slightly increased in comparison to prior examination dated 12/29/2015. These findings are of uncertain significance, and to some extent very likely reflect sequelae of smoking, particularly in the upper lungs. Findings in the lung bases are nonspecific and may reflect bland infectious or inflammatory scarring or alternately early interstitial lung disease in an "indeterminate for UIP" pattern by ATS pulmonary fibrosis criteria. Consider annual CT follow-up to assess for stability of fibrotic findings and pattern if fibrotic interstitial lung disease remains suspected. 2. Minimal paraseptal emphysema.  Emphysema (ICD10-J43.9). 3. Multiple small bilateral pulmonary nodules measuring up to 6 mm and most numerous in the lung apices, all stable compared to prior examination dated 12/29/2015  and benign. 4. Coronary artery disease.  Aortic Atherosclerosis (ICD10-I70.0).   Electronically Signed   By: Eddie Candle M.D.   On: 08/30/2019 14:12   Results for [name], GUERECA" (MRN [id number]) as of 10/29/2019 09:46  Ref. Range 09/25/2019 16:01  FVC-Pre Latest Units: L 3.15  FVC-%Pred-Pre Latest Units: % 65  FEV1-Pre Latest Units: L 2.63  FEV1-%Pred-Pre Latest Units: % 73  Pre FEV1/FVC ratio Latest Units: % 83   Results for [name], Richard "BOB" (MRN [id number]) as of 10/29/2019 09:46  Ref. Range 09/25/2019 16:01  DLCO cor Latest Units: ml/min/mmHg 21.32  DLCO cor % pred Latest Units: % 76    ROS - per HPI  Results for [name], DOOLY" (MRN [id number]) as of 10/29/2019 09:46  Ref. Range 10/24/2007 20:39 09/05/2015 12:13 02/26/2019 15:23 07/17/2019 14:21 07/20/2019 14:55 07/20/2019 14:55 08/17/2019 15:17 10/08/2019 10:11  Anti Nuclear Antibody (ANA) Latest Ref Range: Negative    Negative       Angiotensin-Converting Enzyme Latest Ref Range: 9 - 67 U/L    43      Cyclic Citrullin Peptide Ab Latest Units: UNITS        <16  CCP Antibodies IgG/IgA Latest Ref Range: 0 - 19 units  3        ds DNA Ab Latest Ref Range: 0 - 9 IU/mL  <1        RA Latex Turbid. Latest Ref Range: 0.0 - 13.9 IU/mL  36.8 (H)  575 (H)   206.6 (H)   IgG (Immunoglobin G), Serum Latest Ref Range: 694 - 1618 mg/dL 653 (L)         IgA Latest Ref Range: 68 - 378 mg/dL 140         Scleroderma (Scl-70) (ENA) Antibody, IgG Latest Ref Range: 0.0 - 0.9 AI  <0.2           has a past medical history of Arthritis, Bronchitis, Dysrhythmia, GERD (gastroesophageal reflux disease), Hiatal hernia, Hip joint replacement by other means (2004), Hypercalcemia, Lipoma, Mitral valve prolapse, Pneumonia (2009), Tumor cells, benign, and Unstable angina ([place]) (10/28/2017).   reports that he quit smoking about 32 years ago. His smoking use included cigarettes. He has a 36.00 pack-year smoking history. He has never used smokeless  tobacco.  Past Surgical History:  Procedure Laterality Date  . APPENDECTOMY  1984  . CATARACT EXTRACTION     L and R eye  . CHOLECYSTECTOMY  2002  . HYDROCELE EXCISION Right 11/04/2016   Procedure: HYDROCELECTOMY ADULT;  Surgeon: Franchot Gallo, MD;  [facility];  Service: Urology;  Laterality: Right;  . INGUINAL HERNIA REPAIR Bilateral 11/04/2016   Procedure: HERNIA REPAIR INGUINAL ADULT BILATERAL;  Surgeon: Jackolyn Confer, MD;  [facility];  Service: General;  Laterality: Bilateral;  . INSERTION  OF MESH Bilateral 11/04/2016   Procedure: INSERTION OF MESH;  Surgeon: Jackolyn Confer, MD;  [facility];  Service: General;  Laterality: Bilateral;  . JOINT REPLACEMENT     Lt hip  . LEFT HEART CATH AND CORONARY ANGIOGRAPHY N/A 10/31/2017   Procedure: LEFT HEART CATH AND CORONARY ANGIOGRAPHY;  Surgeon: Troy Sine, MD;  Location: [facility];  Service: Cardiovascular;  Laterality: N/A;  . MASTOIDECTOMY  1996  . NASAL SEPTUM SURGERY     sinus surgery  . PARATHYROIDECTOMY    . TOTAL HIP REVISION  06/14/2011   Procedure: TOTAL HIP REVISION;  Surgeon: Kerin Salen;  Location: [facility];  Service: Orthopedics;  Laterality: Left;  Left Acetabular  Hip Revision    Allergies  Allergen Reactions  . Bupropion Other (See Comments)    Caused altered mental status Altered mental status Caused altered mental status  . Ciprofloxacin-Dexamethasone Other (See Comments)    Burning in ears when Cipro ear drops were used unknown Burning in ears when Cipro ear drops were used  . Gabapentin Other (See Comments)    Caused weakness, dizziness, and forgetfulness  Weakness and dizziness with higher doses. Weakness and dizziness Caused weakness, dizziness, and forgetfulness  . Ciprofloxacin Hcl Other (See Comments)    Burning in ears when drops were used    Immunization History  Administered Date(s) Administered  . Influenza Split 04/05/2015, 03/30/2019  . Influenza, High Dose  Seasonal PF 03/30/2016  . PFIZER SARS-COV-2 Vaccination 08/12/2019, 09/05/2019  . Pneumococcal Polysaccharide-23 10/24/2007    Family History  Problem Relation Age of Onset  . Lung cancer Father 59       smoked  . Heart Problems Father   . Cancer Brother        prostate  . Heart Problems Mother   . Neuropathy Sister   . Healthy Son   . Healthy Daughter   . Healthy Daughter      Current Outpatient Medications:  .  aspirin 81 MG chewable tablet, Chew 81 mg by mouth daily., Disp: , Rfl:  .  famotidine (PEPCID) 20 MG tablet, Take 1 tablet (20 mg total) by mouth at bedtime., Disp: 30 tablet, Rfl: 3 .  FLUoxetine (PROZAC) 40 MG capsule, Take 40 mg by mouth every morning. , Disp: , Rfl:  .  methylphenidate (RITALIN) 10 MG tablet, Take 10 mg by mouth 3 (three) times daily.  , Disp: , Rfl:  .  metoprolol succinate (TOPROL XL) 25 MG 24 hr tablet, Take 1 tablet (25 mg total) by mouth daily., Disp: 90 tablet, Rfl: 3 .  mirtazapine (REMERON) 15 MG tablet, Take 15 mg by mouth at bedtime., Disp: , Rfl:  .  Multiple Vitamin (MULTIVITAMIN) tablet, Take 1 tablet by mouth daily., Disp: , Rfl:  .  nitroGLYCERIN (NITROSTAT) 0.4 MG SL tablet, Place 0.4 mg under the tongue every 5 (five) minutes as needed for chest pain. , Disp: , Rfl:  .  omeprazole (PRILOSEC) 20 MG capsule, Take 20 mg by mouth 2 (two) times daily before a meal. , Disp: , Rfl:  .  polyethylene glycol (MIRALAX / GLYCOLAX) packet, Take 17 g by mouth daily as needed (for constipation.). , Disp: , Rfl:  .  rosuvastatin (CRESTOR) 10 MG tablet, TAKE 1 TABLET(10 MG) BY MOUTH DAILY, Disp: 90 tablet, Rfl: 3 .  SYNTHROID 25 MCG tablet, Take 25 mcg by mouth daily before breakfast. , Disp: , Rfl:  .  tamsulosin (FLOMAX) 0.4 MG CAPS capsule,  Take 0.4 mg by mouth daily after breakfast. , Disp: , Rfl:       Objective:   Vitals:   10/29/19 0943  BP: 118/74  Pulse: 64  SpO2: 98%  Weight: 174 lb 9.6 oz (79.2 kg)  Height: 5\' 11"  (1.803 m)     Estimated body mass index is 24.35 kg/m as calculated from the following:   Height as of this encounter: 5\' 11"  (1.803 m).   Weight as of this encounter: 174 lb 9.6 oz (79.2 kg).  @WEIGHTCHANGE @  [employer]   10/29/19 0943  Weight: 174 lb 9.6 oz (79.2 kg)     Physical Exam  General Appearance:    Alert, cooperative, no distress, appears stated age - no , Deconditioned looking - no , OBESE  - no, Sitting on Wheelchair -  no  Head:    Normocephalic, without obvious abnormality, atraumatic  Eyes:    PERRL, conjunctiva/corneas clear,  Ears:    Normal TM's and external ear canals, both ears  Nose:   Nares normal, septum midline, mucosa normal, no drainage    or sinus tenderness. OXYGEN ON  - no . Patient is @ nra   Throat:   Lips, mucosa, and tongue normal; teeth and gums normal. Cyanosis on lips - no  Neck:   Supple, symmetrical, trachea midline, no adenopathy;    thyroid:  no enlargement/tenderness/nodules; no carotid   bruit or JVD  Back:     Symmetric, no curvature, ROM normal, no CVA tenderness  Lungs:     Distress - no , Wheeze no, Barrell Chest - no, Purse lip breathing - no, Crackles - no   Chest Wall:    No tenderness or deformity.    Heart:    Regular rate and rhythm, S1 and S2 normal, no rub   or gallop, Murmur - no  Breast Exam:    NOT DONE  Abdomen:     Soft, non-tender, bowel sounds active all four quadrants,    no masses, no organomegaly. Visceral obesity - no  Genitalia:   NOT DONE  Rectal:   NOT DONE  Extremities:   Extremities - normal, Has Cane - no, Clubbing - no, Edema - no  Pulses:   2+ and symmetric all extremities  Skin:   Stigmata of Connective Tissue Disease - no  Lymph nodes:   Cervical, supraclavicular, and axillary nodes normal  Psychiatric:  Neurologic:   Pleasant - yes, Anxious - no, Flat affect - no  CAm-ICU - neg, Alert and Oriented x 3 - yes, Moves all 4s - yes, Speech - normal, Cognition - intact           Assessment:       ICD-10-CM    1. Interstitial pulmonary disease ([place])  J84.9   2. Pulmonary emphysema, unspecified emphysema type ([place])  J43.9   3. Dyspnea on exertion  R06.00        Plan:     Patient Instructions     ICD-10-CM   1. Interstitial pulmonary disease ([place])  J84.9   2. Pulmonary emphysema, unspecified emphysema type ([place])  J43.9   3. Dyspnea on exertion  R06.00    #Interstitial lung disease -You have a mild amount of interstitial lung disease that is possibly progressive in the last few years -Reason for this could be the rheumatoid factor or previous smoking history or another reason  Plan -Our staff will try to identify the February 2021 interstitial lung disease questionnaire that he filled  out and that was placed on Ms. [employer] Conservation officer, nature -I will need to review this and based on the answers possibly recommend biopsy -We discussed the 2 methods of biopsy surgical lung biopsy and less invasive bronchoscopy with lavage and mosquito-sized transbronchial biopsy for genomic analysis  -If we decide the biopsy route will go over the bronchoscopy less invasive method I   #Emphysema -Probably from previous smoking  Plan- -Start empiric Spiriva Respimat 2 puffs once daily  #Shortness of breath -Combination of the above 2 reasons  Plan -Refer to pulmonary rehabilitation  Follow-up -Await a phone call from [place] this week -Make a 15-minute telephone visit in 6 weeks just to be on the safe side   (Level 04: Estb 30-39 min  visit type: on-site physical face to visit visit spent in total care time and counseling or/and coordination of care by this undersigned MD - Dr Brand Males. This includes one or more of the following on this same day 10/29/2019: pre-charting, chart review, note writing, documentation discussion of test results, diagnostic or treatment recommendations, prognosis, risks and benefits of management options, instructions, education, compliance or risk-factor reduction. It excludes  time spent by the [place] or office staff in the care of the patient . Actual time is 31 min)    SIGNATURE    Dr. Brand Males, M.D., F.C.C.P,  Pulmonary and Critical Care Medicine Staff Physician, [place] Director - Interstitial Lung Disease  Program  [facility] [place], [place], [address]  Pager: [phone], If no answer or between  15:00h - 7:00h: call [phone] Telephone: [phone]  10:23 AM 10/29/2019

## 2019-10-29 NOTE — Patient Instructions (Addendum)
ICD-10-CM   1. Interstitial pulmonary disease ([place])  J84.9   2. Pulmonary emphysema, unspecified emphysema type ([place])  J43.9   3. Dyspnea on exertion  R06.00    #Interstitial lung disease -You have a mild amount of interstitial lung disease that is possibly progressive in the last few years -Reason for this could be the rheumatoid factor or previous smoking history or another reason  Plan -Our staff will try to identify the February 2021 interstitial lung disease questionnaire that he filled out and that was placed on Ms. [employer] Conservation officer, nature -I will need to review this and based on the answers possibly recommend biopsy -We discussed the 2 methods of biopsy surgical lung biopsy and less invasive bronchoscopy with lavage and mosquito-sized transbronchial biopsy for genomic analysis  -If we decide the biopsy route will go over the bronchoscopy less invasive method I   #Emphysema -Probably from previous smoking  Plan- -Start empiric Spiriva Respimat 2 puffs once daily  #Shortness of breath -Combination of the above 2 reasons  Plan -Refer to pulmonary rehabilitation  Follow-up -Await a phone call from [place] this week -Make a 15-minute telephone visit in 6 weeks just to be on the safe side

## 2019-10-29 NOTE — Progress Notes (Signed)
Received referral from [name] for this pt to participate in pulmonary rehab with the the diagnosis of ILD. Clinical review of pt follow up appt on 10/29/19. Pulmonary office note.  Pt with Covid Risk Score - 4. Pt appropriate for scheduling for Pulmonary rehab.  Will forward to support staff for verification of insurance eligibility/benefits and pulmonary rehab for scheduling with pt consent. Cherre Huger, BSN Cardiac and Training and development officer

## 2019-10-30 ENCOUNTER — Telehealth: Payer: Self-pay | Admitting: Internal Medicine

## 2019-10-30 ENCOUNTER — Telehealth (HOSPITAL_COMMUNITY): Payer: Self-pay

## 2019-10-30 NOTE — Telephone Encounter (Signed)
Pls get me the ILD questionnaire asap. He was supposed to drop it off yesterday. I need to decide on bronch after that. Pls be in touch via phone message

## 2019-10-30 NOTE — Telephone Encounter (Signed)
Pt insurance is active and benefits verified through Medicare a/b Co-pay 0, DED $203/$203 met, out of pocket 0/0 met, co-insurance 20%. no pre-authorization required.  2ndary insurance is active and benefits verified through [employer]. Co-pay 0, DED 0/0 met, out of pocket 0/0 met, co-insurance 0%. No pre-authorization required. 

## 2019-10-30 NOTE — Telephone Encounter (Signed)
Called spoke with patient He states his wife was supposed to drop off today 10/30/19 but was unable wife will drop off 10/31/19, let her know office opened up at 0800

## 2019-10-31 ENCOUNTER — Telehealth (HOSPITAL_COMMUNITY): Payer: Self-pay | Admitting: *Deleted

## 2019-11-01 NOTE — Telephone Encounter (Signed)
Paperwork was dropped off and given to MR. Nothing further needed.

## 2019-11-05 ENCOUNTER — Ambulatory Visit: Payer: Medicare Other | Admitting: Rheumatology

## 2019-11-09 ENCOUNTER — Ambulatory Visit (HOSPITAL_COMMUNITY): Payer: Medicare Other | Attending: Physician Assistant

## 2019-11-09 ENCOUNTER — Other Ambulatory Visit: Payer: Self-pay

## 2019-11-09 DIAGNOSIS — I2510 Atherosclerotic heart disease of native coronary artery without angina pectoris: Secondary | ICD-10-CM

## 2019-11-09 DIAGNOSIS — R000 Tachycardia, unspecified: Secondary | ICD-10-CM | POA: Diagnosis not present

## 2019-11-09 DIAGNOSIS — J849 Interstitial pulmonary disease, unspecified: Secondary | ICD-10-CM | POA: Diagnosis not present

## 2019-11-09 DIAGNOSIS — R0602 Shortness of breath: Secondary | ICD-10-CM

## 2019-11-12 ENCOUNTER — Encounter: Payer: Self-pay | Admitting: Physician Assistant

## 2019-11-13 NOTE — Telephone Encounter (Signed)
Pt returned phone call from Rodney Langton RN in pulmonary rehab for scheduling, sent her a message to call pt back.

## 2019-11-14 ENCOUNTER — Telehealth (HOSPITAL_COMMUNITY): Payer: Self-pay | Admitting: *Deleted

## 2019-11-21 ENCOUNTER — Telehealth (HOSPITAL_COMMUNITY): Payer: Self-pay | Admitting: *Deleted

## 2019-11-21 DIAGNOSIS — H43813 Vitreous degeneration, bilateral: Secondary | ICD-10-CM | POA: Diagnosis not present

## 2019-11-21 DIAGNOSIS — Z961 Presence of intraocular lens: Secondary | ICD-10-CM | POA: Diagnosis not present

## 2019-11-21 DIAGNOSIS — H532 Diplopia: Secondary | ICD-10-CM | POA: Diagnosis not present

## 2019-12-10 ENCOUNTER — Ambulatory Visit (INDEPENDENT_AMBULATORY_CARE_PROVIDER_SITE_OTHER): Payer: Medicare Other | Admitting: Internal Medicine

## 2019-12-10 ENCOUNTER — Other Ambulatory Visit: Payer: Self-pay

## 2019-12-10 DIAGNOSIS — J439 Emphysema, unspecified: Secondary | ICD-10-CM | POA: Diagnosis not present

## 2019-12-10 DIAGNOSIS — I2510 Atherosclerotic heart disease of native coronary artery without angina pectoris: Secondary | ICD-10-CM | POA: Diagnosis not present

## 2019-12-10 DIAGNOSIS — J849 Interstitial pulmonary disease, unspecified: Secondary | ICD-10-CM | POA: Diagnosis not present

## 2019-12-10 DIAGNOSIS — R059 Cough, unspecified: Secondary | ICD-10-CM

## 2019-12-10 DIAGNOSIS — R05 Cough: Secondary | ICD-10-CM

## 2019-12-10 DIAGNOSIS — R0600 Dyspnea, unspecified: Secondary | ICD-10-CM

## 2019-12-10 DIAGNOSIS — R768 Other specified abnormal immunological findings in serum: Secondary | ICD-10-CM | POA: Diagnosis not present

## 2019-12-10 DIAGNOSIS — R0609 Other forms of dyspnea: Secondary | ICD-10-CM

## 2019-12-10 DIAGNOSIS — R7689 Other specified abnormal immunological findings in serum: Secondary | ICD-10-CM

## 2019-12-10 NOTE — Progress Notes (Addendum)
OV 10/29/2019  Subjective:  Patient ID: [name], male , DOB: 23-Jul-1950 , age 69 y.o. , MRN: [id number] , ADDRESS: [address] [place] [address]   10/29/2019 -   Chief Complaint  Patient presents with  . Follow-up    Pt saw Dr. Halford Chessman 3/23 and Dr. Halford Chessman wanted pt to f/u with MR.  Pt states he has been doing good since last visit. Denies any current complaints with breathing.      HPI [name] 69 y.o. -has been referred to the ILD center.  For evaluation of his interstitial lung disease.  He is a former Neurosurgeon.  He tells me that a few years ago he is to be able to walk 3 miles but now has restricted himself to 1-> 1-1/2 miles.  His work-up resulted in a positive rheumatoid factor documented below.  He was referred to rheumatology.  He does not recollect who he saw but he tells me that there is no evidence of rheumatoid arthritis but he has osteoarthritis.  He is a previous smoker having quit 30 years ago.  But more than 30 pack smoking history.  He had a high-resolution CT chest that has both upper lobe and lower lobe findings.  The lower lobe findings are indeterminate for UIP.  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  There is also associated emphysema.  I personally visualized the images.  He had pulmonary function test that shows restriction with low DLCO.    Integrated Comprehensive ILD Questionnaire - he did one and gave it to NP in feb 2021 who then gave to [facility] but currently cannot find it. So, he agreed to do a redo . This is pending currently  Symptoms:  See below   SYMPTOM SCALE - ILD 10/29/2019   O2 use ra  Shortness of Breath 0 -> 5 scale with 5 being worst (score 6 If unable to do)  At rest 1  Simple tasks - showers, clothes change, eating, shaving 1  Household (dishes, doing bed, laundry) 2  Shopping 1.5  Walking level at own pace 1  Walking up Stairs 2  Total (30-36) Dyspnea Score 8.5   How bad is your cough? 0  How bad is your fatigue 1  How bad is nausea 0  How bad is vomiting?  0  How bad is diarrhea? 0  How bad is anxiety? 0  How bad is depression 0        Past Medical History :  GERD severe x 40 yers,  Pnuemonia x 3. First as teenager. Heart disease nos x   ROS:   Faitgued, arthralia x few years with spine stiffness, dysphiag but not in few years.  Does have occasional heartburn for the last several decades.  Is also had oral ulcers on and off occasionally  FAMILY HISTORY of LUNG DISEASE:  -He smoked cigarettes weekly 1967 in 1987 to 30 cigarettes a day.  He smoked marijuana between 1970 1980 frequently in the 1970s.  Never use cocaine abuse intravenous drug use.   EXPOSURE HISTORY:  -[place] home for last 4 years.  Age of the home is 25 years.  At an apartment.  Prior to that lived in her own single-family home.  The last 4 years note dampness.  Previously there was mildew that is being replaced.  He does have a humidifier but he cleans it with vinegar.  No  CPAP use no nebulizer use.  No C-minus.  No junk see no misting Fountain.  No pet birds or parakeets.  No pet gerbils or hamsters.  Does not use feather pillows or duvet.  No mold in the [facility] duct.  No music habits.  No guarding habits.   HOME and HOBBY DETAILS :   -  OCCUPATIONAL HISTORY (122 questions) : Work in Proofreader.  Worked as a Glass blower/designer.  Otherwise negative.  Does not work in a dusty environment.  Never been exposed to fumes.    PULMONARY TOXICITY HISTORY (27 items): Prednisone routine December January 2021    Simple office walk 185 feet x  3 laps goal with forehead probe 10/29/2019   O2 used ra  Number laps completed 3  Comments about pace moderate  Resting Pulse Ox/HR 99% and 80/min  Final Pulse Ox/HR 99% and 93/min  Desaturated </= 88% no  Desaturated <= 3% points no  Got Tachycardic >/= 90/min yes  Symptoms at end of test Mild dyspena  Miscellaneous  comments x    IMPRESSION: HRCT Mediastinum/Nodes: No enlarged mediastinal, hilar, or axillary lymph nodes. Predominantly fat containing hiatal hernia. Thyroid gland, trachea, and esophagus demonstrate no significant findings.  Lungs/Pleura: There is mild, tubular bronchiectasis and mild bronchial wall thickening throughout, with very fine, predominantly peripheral centrilobular nodularity. There is additional, minimal bibasilar irregular interstitial opacity, which is slightly increased in comparison to prior examination dated 12/29/2015. Minimal underlying paraseptal emphysema. No significant air trapping on expiratory phase imaging. There are multiple small bilateral pulmonary nodules measuring up to 6 mm and most numerous in the lung apices, all stable compared to prior examination dated 12/29/2015 and benign. No pleural effusion or pneumothorax.  1. Mild, tubular bronchiectasis and bronchial wall thickening throughout, with very fine, predominantly peripheral centrilobular nodularity in the upper lungs, similar to prior examination. There is additional, minimal bibasilar irregular interstitial opacity, which is slightly increased in comparison to prior examination dated 12/29/2015. These findings are of uncertain significance, and to some extent very likely reflect sequelae of smoking, particularly in the upper lungs. Findings in the lung bases are nonspecific and may reflect bland infectious or inflammatory scarring or alternately early interstitial lung disease in an "indeterminate for UIP" pattern by ATS pulmonary fibrosis criteria. Consider annual CT follow-up to assess for stability of fibrotic findings and pattern if fibrotic interstitial lung disease remains suspected. 2. Minimal paraseptal emphysema.  Emphysema (ICD10-J43.9). 3. Multiple small bilateral pulmonary nodules measuring up to 6 mm and most numerous in the lung apices, all stable compared to prior examination  dated 12/29/2015 and benign. 4. Coronary artery disease.  Aortic Atherosclerosis (ICD10-I70.0).   Electronically Signed   By: Eddie Candle M.D.   On: 08/30/2019 14:12   Results for [name], MUSKA" (MRN [id number]) as of 10/29/2019 09:46  Ref. Range 09/25/2019 16:01  FVC-Pre Latest Units: L 3.15  FVC-%Pred-Pre Latest Units: % 65  FEV1-Pre Latest Units: L 2.63  FEV1-%Pred-Pre Latest Units: % 73  Pre FEV1/FVC ratio Latest Units: % 83   Results for [name], Richard "BOB" (MRN [id number]) as of 10/29/2019 09:46  Ref. Range 09/25/2019 16:01  DLCO cor Latest Units: ml/min/mmHg 21.32  DLCO cor % pred Latest Units: % 76    ROS - per HPI  Results for FENIX, RORKE" (MRN [id number]) as of 10/29/2019 09:46  Ref. Range 10/24/2007 20:39 09/05/2015 12:13 02/26/2019 15:23 07/17/2019 14:21 07/20/2019 14:55 07/20/2019 14:55 08/17/2019 15:17 10/08/2019 10:11  Anti Nuclear Antibody (  ANA) Latest Ref Range: Negative    Negative       Angiotensin-Converting Enzyme Latest Ref Range: 9 - 67 U/L    43      Cyclic Citrullin Peptide Ab Latest Units: UNITS        <16  CCP Antibodies IgG/IgA Latest Ref Range: 0 - 19 units  3        ds DNA Ab Latest Ref Range: 0 - 9 IU/mL  <1        RA Latex Turbid. Latest Ref Range: 0.0 - 13.9 IU/mL  36.8 (H)  575 (H)   206.6 (H)   IgG (Immunoglobin G), Serum Latest Ref Range: 694 - 1618 mg/dL 653 (L)         IgA Latest Ref Range: 68 - 378 mg/dL 140         Scleroderma (Scl-70) (ENA) Antibody, IgG Latest Ref Range: 0.0 - 0.9 AI  <0.2           OV 12/10/2019 - telephone visit.  Patient identified with 2 person identified.  Wife also came on the phone call.  Type of visit: Telephone/Video Circumstance: COVID-19 national emergency Identification of patient [name] with 25-Jan-1951 and MRN [id number] - 2 person identifier Risks: Risks, benefits, limitations of telephone visit explained. Patient understood and verbalized agreement to proceed Anyone else on call:  *wife Patient location: home This provider location: [address], [place] office   Subjective:  Patient ID: [name], male , DOB: 1950/09/08 , age 55 y.o. , MRN: [id number] , ADDRESS: [address] [place] [address] [place]   ILD - indeterminate CT with positive RF  Associated emphysema   12/10/2019 -  No chief complaint on file.  S: doing ok .  Regarding cough:  Cough abated. Not using inahler . Went away after starting spiriva and in a few days resolved. Then stopped spiriva and cough has not recurred. HE is wondering why. Also spiriva costs $220/month. Friend has budesonide/formeterol . I explained this that this is another class. 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  Beyond this we do not have further testing to suggest prognosis.  Therefore biopsy is recommended.  We discussed bronchoscopy as first step with lavage and transbronchial biopsy for RNA genomic analysis.  This case would be under moderate sedation slight/anesthesia versus surgical lung biopsy which would be the gold standard.  Very long discussion about this.  Risks, benefits and limitations explained.  He is going to think about all this do some individual research and call back.   [name] 69 y.o. -     ROS - per HPI     has a past medical history of Arthritis, Bronchitis, Dysrhythmia, GERD (gastroesophageal reflux disease), Hiatal hernia, Hip joint replacement by other means (2004), History of echocardiogram,  Hypercalcemia, Lipoma, Mitral valve prolapse, Pneumonia (2009), Tumor cells, benign, and Unstable angina ([place]) (10/28/2017).   reports that he quit smoking about 32 years ago. His smoking use included cigarettes. He has a 36.00 pack-year smoking history.  He has never used smokeless tobacco.  Past Surgical History:  Procedure Laterality Date  . APPENDECTOMY  1984  . CATARACT EXTRACTION     L and R eye  . CHOLECYSTECTOMY  2002  . HYDROCELE EXCISION Right 11/04/2016   Procedure: HYDROCELECTOMY ADULT;  Surgeon: Franchot Gallo, MD;  Location: [facility];  Service: Urology;  Laterality: Right;  . INGUINAL HERNIA REPAIR Bilateral 11/04/2016   Procedure: HERNIA REPAIR INGUINAL ADULT BILATERAL;  Surgeon: Jackolyn Confer, MD;  Location: [facility];  Service: General;  Laterality: Bilateral;  . INSERTION OF MESH Bilateral 11/04/2016   Procedure: INSERTION OF MESH;  Surgeon: Jackolyn Confer, MD;  Location: [facility];  Service: General;  Laterality: Bilateral;  . JOINT REPLACEMENT     Lt hip  . LEFT HEART CATH AND CORONARY ANGIOGRAPHY N/A 10/31/2017   Procedure: LEFT HEART CATH AND CORONARY ANGIOGRAPHY;  Surgeon: Troy Sine, MD;  Location: [facility];  Service: Cardiovascular;  Laterality: N/A;  . MASTOIDECTOMY  1996  . NASAL SEPTUM SURGERY     sinus surgery  . PARATHYROIDECTOMY    . TOTAL HIP REVISION  06/14/2011   Procedure: TOTAL HIP REVISION;  Surgeon: Kerin Salen;  Location: [facility];  Service: Orthopedics;  Laterality: Left;  Left Acetabular  Hip Revision    Allergies  Allergen Reactions  . Bupropion Other (See Comments)    Caused altered mental status Altered mental status Caused altered mental status  . Ciprofloxacin-Dexamethasone Other (See Comments)    Burning in ears when Cipro ear drops were used unknown Burning in ears when Cipro ear drops were used  . Gabapentin Other (See Comments)    Caused weakness, dizziness, and forgetfulness  Weakness and dizziness with higher  doses. Weakness and dizziness Caused weakness, dizziness, and forgetfulness  . Ciprofloxacin Hcl Other (See Comments)    Burning in ears when drops were used    Immunization History  Administered Date(s) Administered  . Influenza Split 04/05/2015, 03/30/2019  . Influenza, High Dose Seasonal PF 03/30/2016  . PFIZER SARS-COV-2 Vaccination 08/12/2019, 09/05/2019  . Pneumococcal Polysaccharide-23 10/24/2007    Family History  Problem Relation Age of Onset  . Lung cancer Father 97       smoked  . Heart Problems Father   . Cancer Brother        prostate  . Heart Problems Mother   . Neuropathy Sister   . Healthy Son   . Healthy Daughter   . Healthy Daughter      Current Outpatient Medications:  .  aspirin 81 MG chewable tablet, Chew 81 mg by mouth daily., Disp: , Rfl:  .  famotidine (PEPCID) 20 MG tablet, Take 1 tablet (20 mg total) by mouth at bedtime., Disp: 30 tablet, Rfl: 3 .  FLUoxetine (PROZAC) 40 MG capsule, Take 40 mg by mouth every morning. , Disp: , Rfl:  .  methylphenidate (RITALIN) 10 MG tablet, Take 10 mg by mouth 3 (three) times daily.  , Disp: , Rfl:  .  metoprolol succinate (TOPROL XL) 25 MG 24 hr tablet, Take 1 tablet (25 mg total) by mouth daily., Disp: 90 tablet, Rfl: 3 .  mirtazapine (REMERON) 15 MG tablet, Take 15 mg by mouth at bedtime., Disp: , Rfl:  .  Multiple Vitamin (MULTIVITAMIN) tablet, Take 1 tablet by mouth daily., Disp: , Rfl:  .  nitroGLYCERIN (NITROSTAT) 0.4 MG SL tablet, Place 0.4 mg under the tongue every 5 (five) minutes as needed for chest pain. , Disp: ,  Rfl:  .  omeprazole (PRILOSEC) 20 MG capsule, Take 20 mg by mouth 2 (two) times daily before a meal. , Disp: , Rfl:  .  polyethylene glycol (MIRALAX / GLYCOLAX) packet, Take 17 g by mouth daily as needed (for constipation.). , Disp: , Rfl:  .  rosuvastatin (CRESTOR) 10 MG tablet, TAKE 1 TABLET(10 MG) BY MOUTH DAILY, Disp: 90 tablet, Rfl: 3 .  SYNTHROID 25 MCG tablet, Take 25 mcg by mouth daily  before breakfast. , Disp: , Rfl:  .  tamsulosin (FLOMAX) 0.4 MG CAPS capsule, Take 0.4 mg by mouth daily after breakfast. , Disp: , Rfl:  .  Tiotropium Bromide Monohydrate (SPIRIVA RESPIMAT) 2.5 MCG/ACT AERS, Inhale 2 puffs into the lungs daily., Disp: 4 g, Rfl: 11      Objective:   There were no vitals filed for this visit.  Estimated body mass index is 24.35 kg/m as calculated from the following:   Height as of 10/29/19: 5\' 11"  (1.803 m).   Weight as of 10/29/19: 174 lb 9.6 oz (79.2 kg).  @WEIGHTCHANGE @  There were no vitals filed for this visit.   Physical Exam Sounded normal on the phone     Assessment:       ICD-10-CM   1. Interstitial pulmonary disease (HCC)  J84.9 Pulmonary Function Test  2. Elevated rheumatoid factor  R76.8   3. Pulmonary emphysema, unspecified emphysema type ([facility])  J43.9   4. Dyspnea on exertion  R06.00   5. Cough  R05        Plan:     Patient Instructions     ICD-10-CM   1. Interstitial pulmonary disease ([place])  J84.9   2. Elevated rheumatoid factor  R76.8   3. Pulmonary emphysema, unspecified emphysema type ([place])  J43.9   4. Dyspnea on exertion  R06.00   5. Cough  R05    #ILD   - discussed biopsy as recommended approach - either Bronch with envisia (OR) surgical lung biopsy  #Emphysema  -Probably from previous smoking - cough has improved   Plan- -ok to hold off spiriva  #Shortness of breath -Combination of the above 2 reasons  Plan -Glad you are starting  pulmonary rehabilitation  Follow-up -we will wait a call on biopsy deicision  - 3 months do spiro and dlco - 30 min face to face in 3 months    (Telephone visit - Level 03 visit: Estb 21-30 for this visit type which was visit type: telephone visit in total care time and counseling or/and coordination of care by this undersigned MD - Dr Brand Males. This includes one or more of the following for care delivered on 02/24/2020 same day: pre-charting, chart review, note  writing, documentation discussion of test results, diagnostic or treatment recommendations, prognosis, risks and benefits of management options, instructions, education, compliance or risk-factor reduction. It excludes time spent by the [facility] [place] or office staff in the care of the patient. Actual time was 30 min. E&M code is [phone])   SIGNATURE    Dr. Brand Males, M.D., F.C.C.P,  Pulmonary and Critical Care Medicine Staff Physician, [place] Director - Interstitial Lung Disease  Program  Pulmonary [facility] [place], [place], [address]  Pager: [phone], If no answer or between  15:00h - 7:00h: call [phone] Telephone: [phone]  3:38 PM 12/10/2019

## 2019-12-10 NOTE — Patient Instructions (Addendum)
ICD-10-CM   1. Interstitial pulmonary disease ([facility] [place])  J84.9   2. Elevated rheumatoid factor  R76.8   3. Pulmonary emphysema, unspecified emphysema type ([place])  J43.9   4. Dyspnea on exertion  R06.00   5. Cough  R05    #ILD   - discussed biopsy as recommended approach - either Bronch with envisia (OR) surgical lung biopsy  #Emphysema  -Probably from previous smoking - cough has improved   Plan- -ok to hold off spiriva  #Shortness of breath -Combination of the above 2 reasons  Plan -Glad you are starting  pulmonary rehabilitation  Follow-up -we will wait a call on biopsy deicision  - 3 months do spiro and dlco - 30 min face to face in 3 months

## 2019-12-11 ENCOUNTER — Telehealth (HOSPITAL_COMMUNITY): Payer: Self-pay | Admitting: *Deleted

## 2019-12-11 NOTE — Telephone Encounter (Signed)
Called to remind patient of appointment to pulmonary rehab or orientation/walk test on 12/12/2019 @ 1030.  Patient confirmed appointment.

## 2019-12-12 ENCOUNTER — Other Ambulatory Visit: Payer: Self-pay

## 2019-12-12 ENCOUNTER — Encounter (HOSPITAL_COMMUNITY)
Admission: RE | Admit: 2019-12-12 | Discharge: 2019-12-12 | Disposition: A | Discharge status: Home / Self Care | Payer: Medicare Other | Source: Ambulatory Visit | Attending: Internal Medicine | Admitting: Internal Medicine

## 2019-12-12 VITALS — BP 110/70 | HR 81 | Ht 71.0 in | Wt 175.7 lb

## 2019-12-12 DIAGNOSIS — J849 Interstitial pulmonary disease, unspecified: Secondary | ICD-10-CM | POA: Diagnosis not present

## 2019-12-12 NOTE — Progress Notes (Signed)
[name] 69 y.o. male Pulmonary Rehab Orientation Note Patient arrived today in Cardiac and Pulmonary Rehab for orientation to Pulmonary Rehab. He walked from the [facility] parking deck without difficulty or distress. He does not carry portable oxygen. Per pt, he uses oxygen never. Color good, skin warm and dry. Patient is oriented to time and place. Patient's medical history, psychosocial health, and medications reviewed. Psychosocial assessment reveals pt lives with their spouse. Pt is currently retired as a Neurosurgeon. Pt hobbies include writing, he is working on a screen play and book.   Pt does exhibit  signs of depression. Signs of depression include difficulty staying asleep and falling asleep. PHQ2/9 score 0/7.  Mikki Santee has a history of depression and is currently on Prozac and remeron and feels his depression is stable.  He has been on prozac many years and just added remeron recently.   Pt shows good  coping skills with positive outlook . Will continue to monitor and evaluate progress toward psychosocial goal(s) of continued mental well being while participating in pulmonary rehab. Physical assessment reveals heart rate is normal, breath sounds clear to auscultation, no wheezes, rales, or rhonchi. Grip strength equal, strong. Patient reports he does take medications as prescribed. Patient states he follows a Regular diet. The patient reports no specific efforts to gain or lose weight.. Patient's weight will be monitored closely. Demonstration and practice of PLB using pulse oximeter. Patient able to return demonstration satisfactorily. Safety and hand hygiene in the exercise area reviewed with patient. Patient voices understanding of the information reviewed. Department expectations discussed with patient and achievable goals were set. The patient shows enthusiasm about attending the program and we look forward to working with this nice gentleman. The patient completed a 6 min walk test today, 12/12/2019  and to begin exercise on 12/18/2019 in the 1045 exercise slot.  [id number]

## 2019-12-12 NOTE — Progress Notes (Signed)
Pulmonary Individual Treatment Plan  Patient Details  Name: [name] MRN: [id number] Date of Birth: 1950/11/22 Referring Provider:     Pulmonary Rehab Walk Test from 12/12/2019 in [facility]  Referring Provider  Brand Males, MD      Initial Encounter Date:    Pulmonary Rehab Walk Test from 12/12/2019 in [facility]  Date  12/12/19      Visit Diagnosis: Interstitial lung disease ([place])  Patient's Home Medications on Admission:   Current Outpatient Medications:  .  aspirin 81 MG chewable tablet, Chew 81 mg by mouth daily., Disp: , Rfl:  .  famotidine (PEPCID) 20 MG tablet, Take 1 tablet (20 mg total) by mouth at bedtime., Disp: 30 tablet, Rfl: 3 .  FLUoxetine (PROZAC) 40 MG capsule, Take 40 mg by mouth every morning. , Disp: , Rfl:  .  methylphenidate (RITALIN) 10 MG tablet, Take 10 mg by mouth 3 (three) times daily.  , Disp: , Rfl:  .  metoprolol succinate (TOPROL XL) 25 MG 24 hr tablet, Take 1 tablet (25 mg total) by mouth daily., Disp: 90 tablet, Rfl: 3 .  mirtazapine (REMERON) 15 MG tablet, Take 15 mg by mouth at bedtime., Disp: , Rfl:  .  Multiple Vitamin (MULTIVITAMIN) tablet, Take 1 tablet by mouth daily., Disp: , Rfl:  .  nitroGLYCERIN (NITROSTAT) 0.4 MG SL tablet, Place 0.4 mg under the tongue every 5 (five) minutes as needed for chest pain. , Disp: , Rfl:  .  omeprazole (PRILOSEC) 20 MG capsule, Take 20 mg by mouth 2 (two) times daily before a meal. , Disp: , Rfl:  .  polyethylene glycol (MIRALAX / GLYCOLAX) packet, Take 17 g by mouth daily as needed (for constipation.). , Disp: , Rfl:  .  rosuvastatin (CRESTOR) 10 MG tablet, TAKE 1 TABLET(10 MG) BY MOUTH DAILY, Disp: 90 tablet, Rfl: 3 .  SYNTHROID 25 MCG tablet, Take 25 mcg by mouth daily before breakfast. , Disp: , Rfl:  .  tamsulosin (FLOMAX) 0.4 MG CAPS capsule, Take 0.4 mg by mouth daily after breakfast. , Disp: , Rfl:  .  Tiotropium Bromide  Monohydrate (SPIRIVA RESPIMAT) 2.5 MCG/ACT AERS, Inhale 2 puffs into the lungs daily., Disp: 4 g, Rfl: 11  Past Medical History: Past Medical History:  Diagnosis Date  . Arthritis   . Bronchitis    history of  . 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 GERD (gastroesophageal reflux disease)   . Hiatal hernia   . 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 History of echocardiogram    Echo 11/2019: EF 65-70, no R WMA, mild concentric LVH, normal RV SF, normal PASP (RVSP 19.2 mmHg), trivial MR  . Hypercalcemia   . Lipoma    left forearm (3)  . Mitral valve prolapse    does not see cardiologist for. last stress test 2003  . Pneumonia 2009  . Tumor cells, benign    lung, left side  . Unstable angina ([place]) 10/28/2017    Tobacco Use: Social History   Tobacco Use  Smoking Status Former Smoker  . Packs/day: 1.50  . Years: 24.00  . Pack years: 36.00  . Types: Cigarettes  . Quit date: 07/06/1987  . Years since quitting: 32.4  Smokeless Tobacco Never Used    Labs: Recent Chemical engineer    Labs for ITP Cardiac and Pulmonary Rehab Latest Ref Rng & Units 10/21/2015 10/28/2017 10/29/2017 02/13/2018 12/29/2018  Cholestrol 100 - 199 mg/dL 189 - 128 134 -   LDLCALC 0 - 99 mg/dL 124(H) - 76 66 -   HDL >39 mg/dL 43 - 38(L) 41 -   Trlycerides 0 - 149 mg/dL 110 - 69 137 -   Hemoglobin A1c 4.8 - 5.6 % 5.9(H) 5.4 - - -   TCO2 22 - 32 mmol/L - - - - 30      Capillary Blood Glucose: Lab Results  Component Value Date   GLUCAP 152 (H) 12/29/2018   GLUCAP 136 (H) 03/15/2016   GLUCAP 102 (H) 09/05/2015   GLUCAP 84 09/05/2015   GLUCAP 121 (H) 09/04/2015     Pulmonary Assessment Scores: Pulmonary Assessment Scores    Row Name 12/12/19 1203 12/12/19 1204       ADL UCSD   ADL Phase  --  Entry    SOB Score total  --  11      CAT Score   CAT Score  --  7      mMRC Score   mMRC Score  1  --      UCSD: Self-administered rating of dyspnea associated with activities of daily living  (ADLs) 6-point scale (0 = "not at all" to 5 = "maximal or unable to do because of breathlessness")  Scoring Scores range from 0 to 120.  Minimally important difference is 5 units  CAT: CAT can identify the health impairment of COPD patients and is better correlated with disease progression.  CAT has a scoring range of zero to 40. The CAT score is classified into four groups of low (less than 10), medium (10 - 20), high (21-30) and very high (31-40) based on the impact level of disease on health status. A CAT score over 10 suggests significant symptoms.  A worsening CAT score could be explained by an exacerbation, poor medication adherence, poor inhaler technique, or progression of COPD or comorbid conditions.  CAT MCID is 2 points  mMRC: mMRC (Modified Medical Research Council) Dyspnea Scale is used to assess the degree of baseline functional disability in patients of respiratory disease due to dyspnea. No minimal important difference is established. A decrease in score of 1 point or greater is considered a positive change.   Pulmonary Function Assessment:   Exercise Target Goals: Exercise Program Goal: Individual exercise prescription set using results from initial 6 min walk test and THRR while considering  patient's activity barriers and safety.   Exercise Prescription Goal: Initial exercise prescription builds to 30-45 minutes a day of aerobic activity, 2-3 days per week.  Home exercise guidelines will be given to patient during program as part of exercise prescription that the participant will acknowledge.  Activity Barriers & Risk Stratification: Activity Barriers & Cardiac Risk Stratification - 12/12/19 1118      Activity Barriers & Cardiac Risk Stratification   Activity Barriers  Arthritis;Deconditioning;Left Hip Replacement;Shortness of Breath;Muscular Weakness;History of Falls       6 Minute Walk: 6 Minute Walk    Row Name 12/12/19 1132         6 Minute Walk   Phase   Initial     Distance  1471 feet     Walk Time  6 minutes     # of Rest Breaks  0     MPH  2.79     METS  3.53     RPE  11     Perceived Dyspnea   1     VO2 Peak  12.35     Symptoms  No     Resting HR  71 bpm     Resting BP  110/70     Resting Oxygen Saturation   97 %     Exercise Oxygen Saturation  during 6 min walk  96 %     Max Ex. HR  111 bpm     Max Ex. BP  120/72     2 Minute Post BP  136/72       Interval HR   1 Minute HR  100     2 Minute HR  107     3 Minute HR  111     4 Minute HR  110     5 Minute HR  108     6 Minute HR  109     2 Minute Post HR  86     Interval Heart Rate?  Yes       Interval Oxygen   Interval Oxygen?  Yes     Baseline Oxygen Saturation %  97 %     1 Minute Oxygen Saturation %  97 %     1 Minute Liters of Oxygen  0 L     2 Minute Oxygen Saturation %  96 %     2 Minute Liters of Oxygen  0 L     3 Minute Oxygen Saturation %  97 %     3 Minute Liters of Oxygen  0 L     4 Minute Oxygen Saturation %  98 %     4 Minute Liters of Oxygen  0 L     5 Minute Oxygen Saturation %  97 %     5 Minute Liters of Oxygen  0 L     6 Minute Oxygen Saturation %  98 %     6 Minute Liters of Oxygen  0 L     2 Minute Post Oxygen Saturation %  99 %     2 Minute Post Liters of Oxygen  0 L        Oxygen Initial Assessment: Oxygen Initial Assessment - 12/12/19 1121      Home Oxygen   Home Oxygen Device  None    Sleep Oxygen Prescription  None    Home Exercise Oxygen Prescription  None    Home at Rest Exercise Oxygen Prescription  None    Compliance with Home Oxygen Use  No      Initial 6 min Walk   Oxygen Used  None      Program Oxygen Prescription   Program Oxygen Prescription  None      Intervention   Short Term Goals  To learn and exhibit compliance with exercise, home and travel O2 prescription;To learn and understand importance of maintaining oxygen saturations>88%;To learn and demonstrate proper use of respiratory medications;To learn and  understand importance of monitoring SPO2 with pulse oximeter and demonstrate accurate use of the pulse oximeter.;To learn and demonstrate proper pursed lip breathing techniques or other breathing techniques.;Other    Long  Term Goals  Exhibits compliance with exercise, home and travel O2 prescription;Verbalizes importance of monitoring SPO2 with pulse oximeter and return demonstration;Maintenance of O2 saturations>88%;Exhibits proper breathing techniques, such as pursed lip breathing or other method taught during program session;Compliance with respiratory medication;Other       Oxygen Re-Evaluation:   Oxygen Discharge (Final Oxygen Re-Evaluation):   Initial Exercise Prescription: Initial Exercise Prescription - 12/12/19 1100  Date of Initial Exercise RX and Referring Provider   Date  12/12/19    Referring Provider  Brand Males, MD      Recumbant Bike   Level  2    Watts  35    Minutes  15    METs  3.38      NuStep   Level  3    SPM  85    Minutes  15    METs  3.3      Prescription Details   Frequency (times per week)  2    Duration  Progress to 30 minutes of continuous aerobic without signs/symptoms of physical distress      Intensity   THRR 40-80% of Max Heartrate  60-121    Ratings of Perceived Exertion  11-13    Perceived Dyspnea  0-4      Progression   Progression  Continue to progress workloads to maintain intensity without signs/symptoms of physical distress.      Resistance Training   Training Prescription  Yes    Weight  --   blue bands   Reps  10-15       Perform Capillary Blood Glucose checks as needed.  Exercise Prescription Changes:   Exercise Comments: Exercise Comments    Row Name 12/12/19 1157           Exercise Comments  Patient is currently walking 45-60 minutes 2-5 days/week.          Exercise Goals and Review: Exercise Goals    Row Name 12/12/19 1201             Exercise Goals   Increase Physical Activity  Yes        Intervention  Provide advice, education, support and counseling about physical activity/exercise needs.;Develop an individualized exercise prescription for aerobic and resistive training based on initial evaluation findings, risk stratification, comorbidities and participant's personal goals.       Expected Outcomes  Short Term: Attend rehab on a regular basis to increase amount of physical activity.;Long Term: Exercising regularly at least 3-5 days a week.;Long Term: Add in home exercise to make exercise part of routine and to increase amount of physical activity.       Increase Strength and Stamina  Yes       Intervention  Provide advice, education, support and counseling about physical activity/exercise needs.;Develop an individualized exercise prescription for aerobic and resistive training based on initial evaluation findings, risk stratification, comorbidities and participant's personal goals.       Expected Outcomes  Short Term: Increase workloads from initial exercise prescription for resistance, speed, and METs.;Short Term: Perform resistance training exercises routinely during rehab and add in resistance training at home;Long Term: Improve cardiorespiratory fitness, muscular endurance and strength as measured by increased METs and functional capacity (6MWT)       Able to understand and use rate of perceived exertion (RPE) scale  Yes       Intervention  Provide education and explanation on how to use RPE scale       Expected Outcomes  Short Term: Able to use RPE daily in rehab to express subjective intensity level;Long Term:  Able to use RPE to guide intensity level when exercising independently       Able to understand and use Dyspnea scale  Yes       Intervention  Provide education and explanation on how to use Dyspnea scale       Expected Outcomes  Short Term:  Able to use Dyspnea scale daily in rehab to express subjective sense of shortness of breath during exertion;Long Term: Able to use  Dyspnea scale to guide intensity level when exercising independently       Knowledge and understanding of Target Heart Rate Range (THRR)  Yes       Intervention  Provide education and explanation of THRR including how the numbers were predicted and where they are located for reference       Expected Outcomes  Short Term: Able to state/look up THRR;Long Term: Able to use THRR to govern intensity when exercising independently;Short Term: Able to use daily as guideline for intensity in rehab       Understanding of Exercise Prescription  Yes       Intervention  Provide education, explanation, and written materials on patient's individual exercise prescription       Expected Outcomes  Short Term: Able to explain program exercise prescription;Long Term: Able to explain home exercise prescription to exercise independently          Exercise Goals Re-Evaluation :   Discharge Exercise Prescription (Final Exercise Prescription Changes):   Nutrition:  Target Goals: Understanding of nutrition guidelines, daily intake of sodium 1500mg , cholesterol 200mg , calories 30% from fat and 7% or less from saturated fats, daily to have 5 or more servings of fruits and vegetables.  Biometrics: Pre Biometrics - 12/12/19 1120      Pre Biometrics   Grip Strength  36 kg        Nutrition Therapy Plan and Nutrition Goals:   Nutrition Assessments:   Nutrition Goals Re-Evaluation:   Nutrition Goals Discharge (Final Nutrition Goals Re-Evaluation):   Psychosocial: Target Goals: Acknowledge presence or absence of significant depression and/or stress, maximize coping skills, provide positive support system. Participant is able to verbalize types and ability to use techniques and skills needed for reducing stress and depression.  Initial Review & Psychosocial Screening: Initial Psych Review & Screening - 12/12/19 1121      Initial Review   Current issues with  History of Depression;Current Depression       Family Dynamics   Good Support System?  Yes      Barriers   Psychosocial barriers to participate in program  There are no identifiable barriers or psychosocial needs.      Screening Interventions   Interventions  Encouraged to exercise       Quality of Life Scores:  Scores of 19 and below usually indicate a poorer quality of life in these areas.  A difference of  2-3 points is a clinically meaningful difference.  A difference of 2-3 points in the total score of the Quality of Life Index has been associated with significant improvement in overall quality of life, self-image, physical symptoms, and general health in studies assessing change in quality of life.  PHQ-9: Recent Review Flowsheet Data    Depression screen [place] 2/9 12/12/2019   Decreased Interest 0   Down, Depressed, Hopeless 0   PHQ - 2 Score 0   Altered sleeping 3   Tired, decreased energy 3   Change in appetite 1   Feeling bad or failure about yourself  0   Trouble concentrating 0   Moving slowly or fidgety/restless 0   Suicidal thoughts 0   Difficult doing work/chores Not difficult at all     Interpretation of Total Score  Total Score Depression Severity:  1-4 = Minimal depression, 5-9 = Mild depression, 10-14 = Moderate depression, 15-19 =  Moderately severe depression, 20-27 = Severe depression   Psychosocial Evaluation and Intervention: Psychosocial Evaluation - 12/12/19 1122      Psychosocial Evaluation & Interventions   Interventions  Encouraged to exercise with the program and follow exercise prescription    Continue Psychosocial Services   No Follow up required       Psychosocial Re-Evaluation:   Psychosocial Discharge (Final Psychosocial Re-Evaluation):   Education: Education Goals: Education classes will be provided on a weekly basis, covering required topics. Participant will state understanding/return demonstration of topics presented.  Learning Barriers/Preferences: Learning  Barriers/Preferences - 12/12/19 1122      Learning Barriers/Preferences   Learning Barriers  None    Learning Preferences  Computer/Internet;Audio;Group Instruction;Individual Instruction;Pictoral;Skilled Demonstration;Verbal Instruction;Video;Written Material       Education Topics: Risk Factor Reduction:  -Group instruction that is supported by a PowerPoint presentation. Instructor discusses the definition of a risk factor, different risk factors for pulmonary disease, and how the heart and lungs work together.     Nutrition for Pulmonary Patient:  -Group instruction provided by PowerPoint slides, verbal discussion, and written materials to support subject matter. The instructor gives an explanation and review of healthy diet recommendations, which includes a discussion on weight management, recommendations for fruit and vegetable consumption, as well as protein, fluid, caffeine, fiber, sodium, sugar, and alcohol. Tips for eating when patients are short of breath are discussed.   Pursed Lip Breathing:  -Group instruction that is supported by demonstration and informational handouts. Instructor discusses the benefits of pursed lip and diaphragmatic breathing and detailed demonstration on how to preform both.     Oxygen Safety:  -Group instruction provided by PowerPoint, verbal discussion, and written material to support subject matter. There is an overview of "What is Oxygen" and "Why do we need it".  Instructor also reviews how to create a safe environment for oxygen use, the importance of using oxygen as prescribed, and the risks of noncompliance. There is a brief discussion on traveling with oxygen and resources the patient may utilize.   Oxygen Equipment:  -Group instruction provided by [facility] Staff utilizing handouts, written materials, and equipment demonstrations.   Signs and Symptoms:  -Group instruction provided by written material and verbal discussion to support subject  matter. Warning signs and symptoms of infection, stroke, and heart attack are reviewed and when to call the physician/911 reinforced. Tips for preventing the spread of infection discussed.   Advanced Directives:  -Group instruction provided by verbal instruction and written material to support subject matter. Instructor reviews Advanced Directive laws and proper instruction for filling out document.   Pulmonary Video:  -Group video education that reviews the importance of medication and oxygen compliance, exercise, good nutrition, pulmonary hygiene, and pursed lip and diaphragmatic breathing for the pulmonary patient.   Exercise for the Pulmonary Patient:  -Group instruction that is supported by a PowerPoint presentation. Instructor discusses benefits of exercise, core components of exercise, frequency, duration, and intensity of an exercise routine, importance of utilizing pulse oximetry during exercise, safety while exercising, and options of places to exercise outside of rehab.     Pulmonary Medications:  -Verbally interactive group education provided by instructor with focus on inhaled medications and proper administration.   Anatomy and Physiology of the Respiratory System and Intimacy:  -Group instruction provided by PowerPoint, verbal discussion, and written material to support subject matter. Instructor reviews respiratory cycle and anatomical components of the respiratory system and their functions. Instructor also reviews differences in obstructive and restrictive  respiratory diseases with examples of each. Intimacy, Sex, and Sexuality differences are reviewed with a discussion on how relationships can change when diagnosed with pulmonary disease. Common sexual concerns are reviewed.   MD DAY -A group question and answer session with a medical doctor that allows participants to ask questions that relate to their pulmonary disease state.   OTHER EDUCATION -Group or individual  verbal, written, or video instructions that support the educational goals of the pulmonary rehab program.   Holiday Eating Survival Tips:  -Group instruction provided by PowerPoint slides, verbal discussion, and written materials to support subject matter. The instructor gives patients tips, tricks, and techniques to help them not only survive but enjoy the holidays despite the onslaught of food that accompanies the holidays.   Knowledge Questionnaire Score: Knowledge Questionnaire Score - 12/12/19 1206      Knowledge Questionnaire Score   Pre Score  14/18       Core Components/Risk Factors/Patient Goals at Admission: Personal Goals and Risk Factors at Admission - 12/12/19 1122      Core Components/Risk Factors/Patient Goals on Admission   Improve shortness of breath with ADL's  Yes    Intervention  Provide education, individualized exercise plan and daily activity instruction to help decrease symptoms of SOB with activities of daily living.    Expected Outcomes  Short Term: Improve cardiorespiratory fitness to achieve a reduction of symptoms when performing ADLs;Long Term: Be able to perform more ADLs without symptoms or delay the onset of symptoms       Core Components/Risk Factors/Patient Goals Review:  Goals and Risk Factor Review    Row Name 12/12/19 1123 12/12/19 1208           Core Components/Risk Factors/Patient Goals Review   Personal Goals Review  Increase knowledge of respiratory medications and ability to use respiratory devices properly.;Improve shortness of breath with ADL's;Develop more efficient breathing techniques such as purse lipped breathing and diaphragmatic breathing and practicing self-pacing with activity.  Increase knowledge of respiratory medications and ability to use respiratory devices properly.;Improve shortness of breath with ADL's;Develop more efficient breathing techniques such as purse lipped breathing and diaphragmatic breathing and practicing  self-pacing with activity.         Core Components/Risk Factors/Patient Goals at Discharge (Final Review):  Goals and Risk Factor Review - 12/12/19 1208      Core Components/Risk Factors/Patient Goals Review   Personal Goals Review  Increase knowledge of respiratory medications and ability to use respiratory devices properly.;Improve shortness of breath with ADL's;Develop more efficient breathing techniques such as purse lipped breathing and diaphragmatic breathing and practicing self-pacing with activity.       ITP Comments:   Comments:

## 2019-12-18 ENCOUNTER — Encounter (HOSPITAL_COMMUNITY)
Admission: RE | Admit: 2019-12-18 | Discharge: 2019-12-18 | Disposition: A | Discharge status: Home / Self Care | Payer: Medicare Other | Source: Ambulatory Visit | Attending: Internal Medicine | Admitting: Internal Medicine

## 2019-12-18 ENCOUNTER — Other Ambulatory Visit: Payer: Self-pay

## 2019-12-18 VITALS — Wt 173.9 lb

## 2019-12-18 DIAGNOSIS — J849 Interstitial pulmonary disease, unspecified: Secondary | ICD-10-CM

## 2019-12-18 NOTE — Progress Notes (Signed)
Daily Session Note  Patient Details  Name: [name] MRN: [id number] Date of Birth: [birth year] Referring Provider:     Pulmonary Rehab Walk Test from 12/12/2019 in [place]  Referring Provider Brand Males, MD      Encounter Date: 12/18/2019  Check In:  Session Check In - 12/18/19 1222      Check-In   Supervising physician immediately available to respond to emergencies [facility] immediately available    Physician(s) Dr. Cruzita Lederer    Location [facility]    Staff Present Rosebud Poles, RN, Bjorn Loser, MS, CEP, Exercise Physiologist;Lisa Ysidro Evert, RN    Virtual Visit No    Medication changes reported     No    Fall or balance concerns reported    Yes    Tobacco Cessation No Change    Warm-up and Cool-down Performed on first and last piece of equipment    Resistance Training Performed Yes    VAD Patient? No    PAD/SET Patient? No      Pain Assessment   Currently in Pain? No/denies    Multiple Pain Sites No           Capillary Blood Glucose: No results found for this or any previous visit (from the past 24 hour(s)).   Exercise Prescription Changes - 12/18/19 1400      Response to Exercise   Blood Pressure (Admit) 104/60    Blood Pressure (Exercise) 136/74    Blood Pressure (Exit) 116/78    Heart Rate (Admit) 77 bpm    Heart Rate (Exercise) 103 bpm    Heart Rate (Exit) 95 bpm    Oxygen Saturation (Admit) 98 %    Oxygen Saturation (Exercise) 96 %    Oxygen Saturation (Exit) 97 %    Rating of Perceived Exertion (Exercise) 14    Perceived Dyspnea (Exercise) 2    Duration Continue with 30 min of aerobic exercise without signs/symptoms of physical distress.    Intensity THRR unchanged      Progression   Progression Continue to progress workloads to maintain intensity without signs/symptoms of physical distress.      Resistance Training   Training Prescription Yes    Weight blue bands    Reps  10-15    Time 10 Minutes      Recumbant Bike   Level 1.5    Minutes 15    METs 3.6      NuStep   Level 3    SPM 80    Minutes 15    METs 2.2           Social History   Tobacco Use  Smoking Status Former Smoker  . Packs/day: 1.50  . Years: 24.00  . Pack years: 36.00  . Types: Cigarettes  . Quit date: 07/06/1987  . Years since quitting: 32.4  Smokeless Tobacco Never Used    Goals Met:  Independence with exercise equipment Exercise tolerated well Strength training completed today  Goals Unmet:  Not Applicable  Comments: Service time is from 1040 to 1145    Dr. Kate Sable is Medical Director for [facility] and Pulmonary Rehab.

## 2019-12-19 NOTE — Progress Notes (Signed)
Pulmonary Individual Treatment Plan  Patient Details  Name: [name] MRN: [id number] Date of Birth: [birth year] Referring Provider:     Pulmonary Rehab Walk Test from 12/12/2019 in [place]  Referring Provider Brand Males, MD      Initial Encounter Date:    Pulmonary Rehab Walk Test from 12/12/2019 in [place]  Date 12/12/19      Visit Diagnosis: Interstitial lung disease ([place])  Patient's Home Medications on Admission:   Current Outpatient Medications:  .  aspirin 81 MG chewable tablet, Chew 81 mg by mouth daily., Disp: , Rfl:  .  famotidine (PEPCID) 20 MG tablet, Take 1 tablet (20 mg total) by mouth at bedtime., Disp: 30 tablet, Rfl: 3 .  FLUoxetine (PROZAC) 40 MG capsule, Take 40 mg by mouth every morning. , Disp: , Rfl:  .  methylphenidate (RITALIN) 10 MG tablet, Take 10 mg by mouth 3 (three) times daily.  , Disp: , Rfl:  .  metoprolol succinate (TOPROL XL) 25 MG 24 hr tablet, Take 1 tablet (25 mg total) by mouth daily., Disp: 90 tablet, Rfl: 3 .  mirtazapine (REMERON) 15 MG tablet, Take 15 mg by mouth at bedtime., Disp: , Rfl:  .  Multiple Vitamin (MULTIVITAMIN) tablet, Take 1 tablet by mouth daily., Disp: , Rfl:  .  nitroGLYCERIN (NITROSTAT) 0.4 MG SL tablet, Place 0.4 mg under the tongue every 5 (five) minutes as needed for chest pain. , Disp: , Rfl:  .  omeprazole (PRILOSEC) 20 MG capsule, Take 20 mg by mouth 2 (two) times daily before a meal. , Disp: , Rfl:  .  polyethylene glycol (MIRALAX / GLYCOLAX) packet, Take 17 g by mouth daily as needed (for constipation.). , Disp: , Rfl:  .  rosuvastatin (CRESTOR) 10 MG tablet, TAKE 1 TABLET(10 MG) BY MOUTH DAILY, Disp: 90 tablet, Rfl: 3 .  SYNTHROID 25 MCG tablet, Take 25 mcg by mouth daily before breakfast. , Disp: , Rfl:  .  tamsulosin (FLOMAX) 0.4 MG CAPS capsule, Take 0.4 mg by mouth daily after breakfast. , Disp: , Rfl:  .  Tiotropium Bromide  Monohydrate (SPIRIVA RESPIMAT) 2.5 MCG/ACT AERS, Inhale 2 puffs into the lungs daily., Disp: 4 g, Rfl: 11  Past Medical History: Past Medical History:  Diagnosis Date  . Arthritis   . Bronchitis    history of  . 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 GERD (gastroesophageal reflux disease)   . Hiatal hernia   . 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 History of echocardiogram    Echo 11/2019: EF 65-70, no R WMA, mild concentric LVH, normal RV SF, normal PASP (RVSP 19.2 mmHg), trivial MR  . Hypercalcemia   . Lipoma    left forearm (3)  . Mitral valve prolapse    does not see cardiologist for. last stress test 2003  . Pneumonia 2009  . Tumor cells, benign    lung, left side  . Unstable angina ([place]) 10/28/2017    Tobacco Use: Social History   Tobacco Use  Smoking Status Former Smoker  . Packs/day: 1.50  . Years: 24.00  . Pack years: 36.00  . Types: Cigarettes  . Quit date: 07/06/1987  . Years since quitting: 32.4  Smokeless Tobacco Never Used    Labs: Recent Chemical engineer    Labs for ITP Cardiac and Pulmonary Rehab Latest Ref Rng & Units 10/21/2015 10/28/2017 10/29/2017 02/13/2018 12/29/2018   Cholestrol  100 - 199 mg/dL 189 - 128 134 -   LDLCALC 0 - 99 mg/dL 124(H) - 76 66 -   HDL >39 mg/dL 43 - 38(L) 41 -   Trlycerides 0 - 149 mg/dL 110 - 69 137 -   Hemoglobin A1c 4.8 - 5.6 % 5.9(H) 5.4 - - -   TCO2 22 - 32 mmol/L - - - - 30      Capillary Blood Glucose: Lab Results  Component Value Date   GLUCAP 152 (H) 12/29/2018   GLUCAP 136 (H) 03/15/2016   GLUCAP 102 (H) 09/05/2015   GLUCAP 84 09/05/2015   GLUCAP 121 (H) 09/04/2015     Pulmonary Assessment Scores:  Pulmonary Assessment Scores    Row Name 12/12/19 1203 12/12/19 1204       ADL UCSD   ADL Phase -- Entry    SOB Score total -- 11      CAT Score   CAT Score -- 7      mMRC Score   mMRC Score 1 --          UCSD: Self-administered rating of dyspnea associated with activities of daily living  (ADLs) 6-point scale (0 = "not at all" to 5 = "maximal or unable to do because of breathlessness")  Scoring Scores range from 0 to 120.  Minimally important difference is 5 units  CAT: CAT can identify the health impairment of COPD patients and is better correlated with disease progression.  CAT has a scoring range of zero to 40. The CAT score is classified into four groups of low (less than 10), medium (10 - 20), high (21-30) and very high (31-40) based on the impact level of disease on health status. A CAT score over 10 suggests significant symptoms.  A worsening CAT score could be explained by an exacerbation, poor medication adherence, poor inhaler technique, or progression of COPD or comorbid conditions.  CAT MCID is 2 points  mMRC: mMRC (Modified Medical Research Council) Dyspnea Scale is used to assess the degree of baseline functional disability in patients of respiratory disease due to dyspnea. No minimal important difference is established. A decrease in score of 1 point or greater is considered a positive change.   Pulmonary Function Assessment:   Exercise Target Goals: Exercise Program Goal: Individual exercise prescription set using results from initial 6 min walk test and THRR while considering  patient's activity barriers and safety.   Exercise Prescription Goal: Initial exercise prescription builds to 30-45 minutes a day of aerobic activity, 2-3 days per week.  Home exercise guidelines will be given to patient during program as part of exercise prescription that the participant will acknowledge.  Activity Barriers & Risk Stratification:  Activity Barriers & Cardiac Risk Stratification - 12/12/19 1118      Activity Barriers & Cardiac Risk Stratification   Activity Barriers Arthritis;Deconditioning;Left Hip Replacement;Shortness of Breath;Muscular Weakness;History of Falls           6 Minute Walk:  6 Minute Walk    Row Name 12/12/19 1132         6 Minute Walk    Phase Initial     Distance 1471 feet     Walk Time 6 minutes     # of Rest Breaks 0     MPH 2.79     METS 3.53     RPE 11     Perceived Dyspnea  1     VO2 Peak 12.35     Symptoms No  Resting HR 71 bpm     Resting BP 110/70     Resting Oxygen Saturation  97 %     Exercise Oxygen Saturation  during 6 min walk 96 %     Max Ex. HR 111 bpm     Max Ex. BP 120/72     2 Minute Post BP 136/72       Interval HR   1 Minute HR 100     2 Minute HR 107     3 Minute HR 111     4 Minute HR 110     5 Minute HR 108     6 Minute HR 109     2 Minute Post HR 86     Interval Heart Rate? Yes       Interval Oxygen   Interval Oxygen? Yes     Baseline Oxygen Saturation % 97 %     1 Minute Oxygen Saturation % 97 %     1 Minute Liters of Oxygen 0 L     2 Minute Oxygen Saturation % 96 %     2 Minute Liters of Oxygen 0 L     3 Minute Oxygen Saturation % 97 %     3 Minute Liters of Oxygen 0 L     4 Minute Oxygen Saturation % 98 %     4 Minute Liters of Oxygen 0 L     5 Minute Oxygen Saturation % 97 %     5 Minute Liters of Oxygen 0 L     6 Minute Oxygen Saturation % 98 %     6 Minute Liters of Oxygen 0 L     2 Minute Post Oxygen Saturation % 99 %     2 Minute Post Liters of Oxygen 0 L            Oxygen Initial Assessment:  Oxygen Initial Assessment - 12/12/19 1121      Home Oxygen   Home Oxygen Device None    Sleep Oxygen Prescription None    Home Exercise Oxygen Prescription None    Home at Rest Exercise Oxygen Prescription None    Compliance with Home Oxygen Use No      Initial 6 min Walk   Oxygen Used None      Program Oxygen Prescription   Program Oxygen Prescription None      Intervention   Short Term Goals To learn and exhibit compliance with exercise, home and travel O2 prescription;To learn and understand importance of maintaining oxygen saturations>88%;To learn and demonstrate proper use of respiratory medications;To learn and understand importance of monitoring SPO2  with pulse oximeter and demonstrate accurate use of the pulse oximeter.;To learn and demonstrate proper pursed lip breathing techniques or other breathing techniques.;Other    Long  Term Goals Exhibits compliance with exercise, home and travel O2 prescription;Verbalizes importance of monitoring SPO2 with pulse oximeter and return demonstration;Maintenance of O2 saturations>88%;Exhibits proper breathing techniques, such as pursed lip breathing or other method taught during program session;Compliance with respiratory medication;Other           Oxygen Re-Evaluation:  Oxygen Re-Evaluation    Row Name 12/18/19 1442             Program Oxygen Prescription   Program Oxygen Prescription None         Home Oxygen   Home Oxygen Device None       Sleep Oxygen Prescription None       Home  Exercise Oxygen Prescription None       Home at Rest Exercise Oxygen Prescription None       Compliance with Home Oxygen Use Yes         Goals/Expected Outcomes   Short Term Goals To learn and exhibit compliance with exercise, home and travel O2 prescription;To learn and understand importance of maintaining oxygen saturations>88%;To learn and demonstrate proper use of respiratory medications;To learn and understand importance of monitoring SPO2 with pulse oximeter and demonstrate accurate use of the pulse oximeter.;To learn and demonstrate proper pursed lip breathing techniques or other breathing techniques.;Other       Long  Term Goals Exhibits compliance with exercise, home and travel O2 prescription;Verbalizes importance of monitoring SPO2 with pulse oximeter and return demonstration;Maintenance of O2 saturations>88%;Exhibits proper breathing techniques, such as pursed lip breathing or other method taught during program session;Compliance with respiratory medication;Other       Goals/Expected Outcomes compliance              Oxygen Discharge (Final Oxygen Re-Evaluation):  Oxygen Re-Evaluation - 12/18/19  1442      Program Oxygen Prescription   Program Oxygen Prescription None      Home Oxygen   Home Oxygen Device None    Sleep Oxygen Prescription None    Home Exercise Oxygen Prescription None    Home at Rest Exercise Oxygen Prescription None    Compliance with Home Oxygen Use Yes      Goals/Expected Outcomes   Short Term Goals To learn and exhibit compliance with exercise, home and travel O2 prescription;To learn and understand importance of maintaining oxygen saturations>88%;To learn and demonstrate proper use of respiratory medications;To learn and understand importance of monitoring SPO2 with pulse oximeter and demonstrate accurate use of the pulse oximeter.;To learn and demonstrate proper pursed lip breathing techniques or other breathing techniques.;Other    Long  Term Goals Exhibits compliance with exercise, home and travel O2 prescription;Verbalizes importance of monitoring SPO2 with pulse oximeter and return demonstration;Maintenance of O2 saturations>88%;Exhibits proper breathing techniques, such as pursed lip breathing or other method taught during program session;Compliance with respiratory medication;Other    Goals/Expected Outcomes compliance           Initial Exercise Prescription:  Initial Exercise Prescription - 12/12/19 1100      Date of Initial Exercise RX and Referring Provider   Date 12/12/19    Referring Provider Brand Males, MD      Recumbant Bike   Level 2    Watts 35    Minutes 15    METs 3.38      NuStep   Level 3    SPM 85    Minutes 15    METs 3.3      Prescription Details   Frequency (times per week) 2    Duration Progress to 30 minutes of continuous aerobic without signs/symptoms of physical distress      Intensity   THRR 40-80% of Max Heartrate 60-121    Ratings of Perceived Exertion 11-13    Perceived Dyspnea 0-4      Progression   Progression Continue to progress workloads to maintain intensity without signs/symptoms of  physical distress.      Resistance Training   Training Prescription Yes    Weight --   blue bands   Reps 10-15           Perform Capillary Blood Glucose checks as needed.  Exercise Prescription Changes:  Exercise Prescription Changes    Row  Name 12/18/19 1400             Response to Exercise   Blood Pressure (Admit) 104/60       Blood Pressure (Exercise) 136/74       Blood Pressure (Exit) 116/78       Heart Rate (Admit) 77 bpm       Heart Rate (Exercise) 103 bpm       Heart Rate (Exit) 95 bpm       Oxygen Saturation (Admit) 98 %       Oxygen Saturation (Exercise) 96 %       Oxygen Saturation (Exit) 97 %       Rating of Perceived Exertion (Exercise) 14       Perceived Dyspnea (Exercise) 2       Duration Continue with 30 min of aerobic exercise without signs/symptoms of physical distress.       Intensity THRR unchanged         Progression   Progression Continue to progress workloads to maintain intensity without signs/symptoms of physical distress.         Resistance Training   Training Prescription Yes       Weight blue bands       Reps 10-15       Time 10 Minutes         Recumbant Bike   Level 1.5       Minutes 15       METs 3.6         NuStep   Level 3       SPM 80       Minutes 15       METs 2.2              Exercise Comments:  Exercise Comments    Row Name 12/12/19 1157 12/18/19 1429         Exercise Comments Patient is currently walking 45-60 minutes 2-5 days/week. Pt completed first day of exercise today with no complaints. He tolerated exercise well.             Exercise Goals and Review:  Exercise Goals    Row Name 12/12/19 1201 12/18/19 1446           Exercise Goals   Increase Physical Activity Yes Yes      Intervention Provide advice, education, support and counseling about physical activity/exercise needs.;Develop an individualized exercise prescription for aerobic and resistive training based on initial evaluation findings,  risk stratification, comorbidities and participant's personal goals. Provide advice, education, support and counseling about physical activity/exercise needs.;Develop an individualized exercise prescription for aerobic and resistive training based on initial evaluation findings, risk stratification, comorbidities and participant's personal goals.      Expected Outcomes Short Term: Attend rehab on a regular basis to increase amount of physical activity.;Long Term: Exercising regularly at least 3-5 days a week.;Long Term: Add in home exercise to make exercise part of routine and to increase amount of physical activity. Short Term: Attend rehab on a regular basis to increase amount of physical activity.;Long Term: Exercising regularly at least 3-5 days a week.;Long Term: Add in home exercise to make exercise part of routine and to increase amount of physical activity.      Increase Strength and Stamina Yes Yes      Intervention Provide advice, education, support and counseling about physical activity/exercise needs.;Develop an individualized exercise prescription for aerobic and resistive training based on initial evaluation findings, risk stratification, comorbidities  and participant's personal goals. Provide advice, education, support and counseling about physical activity/exercise needs.;Develop an individualized exercise prescription for aerobic and resistive training based on initial evaluation findings, risk stratification, comorbidities and participant's personal goals.      Expected Outcomes Short Term: Increase workloads from initial exercise prescription for resistance, speed, and METs.;Short Term: Perform resistance training exercises routinely during rehab and add in resistance training at home;Long Term: Improve cardiorespiratory fitness, muscular endurance and strength as measured by increased METs and functional capacity (6MWT) Short Term: Increase workloads from initial exercise prescription for  resistance, speed, and METs.;Short Term: Perform resistance training exercises routinely during rehab and add in resistance training at home;Long Term: Improve cardiorespiratory fitness, muscular endurance and strength as measured by increased METs and functional capacity (6MWT)      Able to understand and use rate of perceived exertion (RPE) scale Yes Yes      Intervention Provide education and explanation on how to use RPE scale Provide education and explanation on how to use RPE scale      Expected Outcomes Short Term: Able to use RPE daily in rehab to express subjective intensity level;Long Term:  Able to use RPE to guide intensity level when exercising independently Short Term: Able to use RPE daily in rehab to express subjective intensity level;Long Term:  Able to use RPE to guide intensity level when exercising independently      Able to understand and use Dyspnea scale Yes Yes      Intervention Provide education and explanation on how to use Dyspnea scale Provide education and explanation on how to use Dyspnea scale      Expected Outcomes Short Term: Able to use Dyspnea scale daily in rehab to express subjective sense of shortness of breath during exertion;Long Term: Able to use Dyspnea scale to guide intensity level when exercising independently Short Term: Able to use Dyspnea scale daily in rehab to express subjective sense of shortness of breath during exertion;Long Term: Able to use Dyspnea scale to guide intensity level when exercising independently      Knowledge and understanding of Target Heart Rate Range (THRR) Yes Yes      Intervention Provide education and explanation of THRR including how the numbers were predicted and where they are located for reference Provide education and explanation of THRR including how the numbers were predicted and where they are located for reference      Expected Outcomes Short Term: Able to state/look up THRR;Long Term: Able to use THRR to govern intensity  when exercising independently;Short Term: Able to use daily as guideline for intensity in rehab Short Term: Able to state/look up THRR;Long Term: Able to use THRR to govern intensity when exercising independently;Short Term: Able to use daily as guideline for intensity in rehab      Understanding of Exercise Prescription Yes Yes      Intervention Provide education, explanation, and written materials on patient's individual exercise prescription Provide education, explanation, and written materials on patient's individual exercise prescription      Expected Outcomes Short Term: Able to explain program exercise prescription;Long Term: Able to explain home exercise prescription to exercise independently Short Term: Able to explain program exercise prescription;Long Term: Able to explain home exercise prescription to exercise independently             Exercise Goals Re-Evaluation :  Exercise Goals Re-Evaluation    [place] Name 12/18/19 1444             Exercise Goal  Re-Evaluation   Exercise Goals Review Increase Physical Activity;Increase Strength and Stamina;Able to understand and use rate of perceived exertion (RPE) scale;Able to understand and use Dyspnea scale;Knowledge and understanding of Target Heart Rate Range (THRR);Understanding of Exercise Prescription       Comments Pt has completed 1 exercise session. He has a positive attitude and is eager to exercise. He exercised at 3.6 METs on the recumbent bike. Will monitor and progress as able.       Expected Outcomes Through exercise at rehab and at home, the patient will decrease shortness of breath with daily activities and feel confident in carrying out an exercise regime at home.              Discharge Exercise Prescription (Final Exercise Prescription Changes):  Exercise Prescription Changes - 12/18/19 1400      Response to Exercise   Blood Pressure (Admit) 104/60    Blood Pressure (Exercise) 136/74    Blood Pressure (Exit) 116/78      Heart Rate (Admit) 77 bpm    Heart Rate (Exercise) 103 bpm    Heart Rate (Exit) 95 bpm    Oxygen Saturation (Admit) 98 %    Oxygen Saturation (Exercise) 96 %    Oxygen Saturation (Exit) 97 %    Rating of Perceived Exertion (Exercise) 14    Perceived Dyspnea (Exercise) 2    Duration Continue with 30 min of aerobic exercise without signs/symptoms of physical distress.    Intensity THRR unchanged      Progression   Progression Continue to progress workloads to maintain intensity without signs/symptoms of physical distress.      Resistance Training   Training Prescription Yes    Weight blue bands    Reps 10-15    Time 10 Minutes      Recumbant Bike   Level 1.5    Minutes 15    METs 3.6      NuStep   Level 3    SPM 80    Minutes 15    METs 2.2           Nutrition:  Target Goals: Understanding of nutrition guidelines, daily intake of sodium 1500mg , cholesterol 200mg , calories 30% from fat and 7% or less from saturated fats, daily to have 5 or more servings of fruits and vegetables.  Biometrics:  Pre Biometrics - 12/12/19 1120      Pre Biometrics   Grip Strength 36 kg            Nutrition Therapy Plan and Nutrition Goals:   Nutrition Assessments:   Nutrition Goals Re-Evaluation:   Nutrition Goals Discharge (Final Nutrition Goals Re-Evaluation):   Psychosocial: Target Goals: Acknowledge presence or absence of significant depression and/or stress, maximize coping skills, provide positive support system. Participant is able to verbalize types and ability to use techniques and skills needed for reducing stress and depression.  Initial Review & Psychosocial Screening:  Initial Psych Review & Screening - 12/12/19 1121      Initial Review   Current issues with History of Depression;Current Depression      Family Dynamics   Good Support System? Yes      Barriers   Psychosocial barriers to participate in program There are no identifiable barriers or  psychosocial needs.      Screening Interventions   Interventions Encouraged to exercise           Quality of Life Scores:  Scores of 19 and below usually indicate a poorer quality  of life in these areas.  A difference of  2-3 points is a clinically meaningful difference.  A difference of 2-3 points in the total score of the Quality of Life Index has been associated with significant improvement in overall quality of life, self-image, physical symptoms, and general health in studies assessing change in quality of life.  PHQ-9: Recent Review Flowsheet Data    Depression screen [facility] 2/9 12/12/2019   Decreased Interest 0   Down, Depressed, Hopeless 0   PHQ - 2 Score 0   Altered sleeping 3   Tired, decreased energy 3   Change in appetite 1   Feeling bad or failure about yourself  0   Trouble concentrating 0   Moving slowly or fidgety/restless 0   Suicidal thoughts 0   Difficult doing work/chores Not difficult at all     Interpretation of Total Score  Total Score Depression Severity:  1-4 = Minimal depression, 5-9 = Mild depression, 10-14 = Moderate depression, 15-19 = Moderately severe depression, 20-27 = Severe depression   Psychosocial Evaluation and Intervention:  Psychosocial Evaluation - 12/12/19 1122      Psychosocial Evaluation & Interventions   Interventions Encouraged to exercise with the program and follow exercise prescription    Continue Psychosocial Services  No Follow up required           Psychosocial Re-Evaluation:  Psychosocial Re-Evaluation    [place] Name 12/18/19 1538             Psychosocial Re-Evaluation   Current issues with Current Depression;History of Depression       Comments Depression is currently stable, no barriers or concerns identified.       Expected Outcomes For patient to be free of barriers or psychosocial concerns while participating in pulmonary rehab.       Interventions Encouraged to attend Pulmonary Rehabilitation for the exercise        Continue Psychosocial Services  No Follow up required              Psychosocial Discharge (Final Psychosocial Re-Evaluation):  Psychosocial Re-Evaluation - 12/18/19 1538      Psychosocial Re-Evaluation   Current issues with Current Depression;History of Depression    Comments Depression is currently stable, no barriers or concerns identified.    Expected Outcomes For patient to be free of barriers or psychosocial concerns while participating in pulmonary rehab.    Interventions Encouraged to attend Pulmonary Rehabilitation for the exercise    Continue Psychosocial Services  No Follow up required           Education: Education Goals: Education classes will be provided on a weekly basis, covering required topics. Participant will state understanding/return demonstration of topics presented.  Learning Barriers/Preferences:  Learning Barriers/Preferences - 12/12/19 1122      Learning Barriers/Preferences   Learning Barriers None    Learning Preferences Computer/Internet;Audio;Group Instruction;Individual Instruction;Pictoral;Skilled Demonstration;Verbal Instruction;Video;Written Material           Education Topics: Risk Factor Reduction:  -Group instruction that is supported by a PowerPoint presentation. Instructor discusses the definition of a risk factor, different risk factors for pulmonary disease, and how the heart and lungs work together.     Nutrition for Pulmonary Patient:  -Group instruction provided by PowerPoint slides, verbal discussion, and written materials to support subject matter. The instructor gives an explanation and review of healthy diet recommendations, which includes a discussion on weight management, recommendations for fruit and vegetable consumption, as well as protein, fluid, caffeine, fiber,  sodium, sugar, and alcohol. Tips for eating when patients are short of breath are discussed.   Pursed Lip Breathing:  -Group instruction that is supported  by demonstration and informational handouts. Instructor discusses the benefits of pursed lip and diaphragmatic breathing and detailed demonstration on how to preform both.     Oxygen Safety:  -Group instruction provided by PowerPoint, verbal discussion, and written material to support subject matter. There is an overview of "What is Oxygen" and "Why do we need it".  Instructor also reviews how to create a safe environment for oxygen use, the importance of using oxygen as prescribed, and the risks of noncompliance. There is a brief discussion on traveling with oxygen and resources the patient may utilize.   Oxygen Equipment:  -Group instruction provided by [facility] Staff utilizing handouts, written materials, and equipment demonstrations.   Signs and Symptoms:  -Group instruction provided by written material and verbal discussion to support subject matter. Warning signs and symptoms of infection, stroke, and heart attack are reviewed and when to call the physician/911 reinforced. Tips for preventing the spread of infection discussed.   Advanced Directives:  -Group instruction provided by verbal instruction and written material to support subject matter. Instructor reviews Advanced Directive laws and proper instruction for filling out document.   Pulmonary Video:  -Group video education that reviews the importance of medication and oxygen compliance, exercise, good nutrition, pulmonary hygiene, and pursed lip and diaphragmatic breathing for the pulmonary patient.   Exercise for the Pulmonary Patient:  -Group instruction that is supported by a PowerPoint presentation. Instructor discusses benefits of exercise, core components of exercise, frequency, duration, and intensity of an exercise routine, importance of utilizing pulse oximetry during exercise, safety while exercising, and options of places to exercise outside of rehab.     Pulmonary Medications:  -Verbally interactive group  education provided by instructor with focus on inhaled medications and proper administration.   Anatomy and Physiology of the Respiratory System and Intimacy:  -Group instruction provided by PowerPoint, verbal discussion, and written material to support subject matter. Instructor reviews respiratory cycle and anatomical components of the respiratory system and their functions. Instructor also reviews differences in obstructive and restrictive respiratory diseases with examples of each. Intimacy, Sex, and Sexuality differences are reviewed with a discussion on how relationships can change when diagnosed with pulmonary disease. Common sexual concerns are reviewed.   MD DAY -A group question and answer session with a medical doctor that allows participants to ask questions that relate to their pulmonary disease state.   OTHER EDUCATION -Group or individual verbal, written, or video instructions that support the educational goals of the pulmonary rehab program.   Holiday Eating Survival Tips:  -Group instruction provided by PowerPoint slides, verbal discussion, and written materials to support subject matter. The instructor gives patients tips, tricks, and techniques to help them not only survive but enjoy the holidays despite the onslaught of food that accompanies the holidays.   Knowledge Questionnaire Score:  Knowledge Questionnaire Score - 12/12/19 1206      Knowledge Questionnaire Score   Pre Score 14/18           Core Components/Risk Factors/Patient Goals at Admission:  Personal Goals and Risk Factors at Admission - 12/12/19 1122      Core Components/Risk Factors/Patient Goals on Admission   Improve shortness of breath with ADL's Yes    Intervention Provide education, individualized exercise plan and daily activity instruction to help decrease symptoms of SOB with activities of daily  living.    Expected Outcomes Short Term: Improve cardiorespiratory fitness to achieve a reduction  of symptoms when performing ADLs;Long Term: Be able to perform more ADLs without symptoms or delay the onset of symptoms           Core Components/Risk Factors/Patient Goals Review:   Goals and Risk Factor Review    Row Name 12/12/19 1123 12/12/19 1208 12/18/19 1539         Core Components/Risk Factors/Patient Goals Review   Personal Goals Review Increase knowledge of respiratory medications and ability to use respiratory devices properly.;Improve shortness of breath with ADL's;Develop more efficient breathing techniques such as purse lipped breathing and diaphragmatic breathing and practicing self-pacing with activity. Increase knowledge of respiratory medications and ability to use respiratory devices properly.;Improve shortness of breath with ADL's;Develop more efficient breathing techniques such as purse lipped breathing and diaphragmatic breathing and practicing self-pacing with activity. Increase knowledge of respiratory medications and ability to use respiratory devices properly.;Improve shortness of breath with ADL's;Develop more efficient breathing techniques such as purse lipped breathing and diaphragmatic breathing and practicing self-pacing with activity.     Review -- -- Mikki Santee just started pulmonary rehab today, too earlt to see progression towards goals.     Expected Outcomes -- -- See admission goals.            Core Components/Risk Factors/Patient Goals at Discharge (Final Review):   Goals and Risk Factor Review - 12/18/19 1539      Core Components/Risk Factors/Patient Goals Review   Personal Goals Review Increase knowledge of respiratory medications and ability to use respiratory devices properly.;Improve shortness of breath with ADL's;Develop more efficient breathing techniques such as purse lipped breathing and diaphragmatic breathing and practicing self-pacing with activity.    Review Mikki Santee just started pulmonary rehab today, too earlt to see progression towards goals.     Expected Outcomes See admission goals.           ITP Comments:   Comments: ITP REVIEW Pt is making expected progress toward pulmonary rehab goals after completing 1 sessions. Recommend continued exercise, life style modification, education, and utilization of breathing techniques to increase stamina and strength and decrease shortness of breath with exertion.

## 2019-12-20 ENCOUNTER — Encounter (HOSPITAL_COMMUNITY)
Admission: RE | Admit: 2019-12-20 | Discharge: 2019-12-20 | Disposition: A | Discharge status: Home / Self Care | Payer: Medicare Other | Source: Ambulatory Visit | Attending: Internal Medicine | Admitting: Internal Medicine

## 2019-12-20 ENCOUNTER — Other Ambulatory Visit: Payer: Self-pay

## 2019-12-20 DIAGNOSIS — J849 Interstitial pulmonary disease, unspecified: Secondary | ICD-10-CM | POA: Diagnosis not present

## 2019-12-20 NOTE — Progress Notes (Signed)
Daily Session Note  Patient Details  Name: [name] MRN: [id number] Date of Birth: [birth year] Referring Provider:     Pulmonary Rehab Walk Test from 12/12/2019 in [place]  Referring Provider Brand Males, MD      Encounter Date: 12/20/2019  Check In:  Session Check In - 12/20/19 1154      Check-In   Supervising physician immediately available to respond to emergencies [facility] immediately available    Physician(s) Dr. Zigmund Daniel    Location [facility]    Staff Present Rosebud Poles, RN, BSN;Serge Main Ysidro Evert, RN;Meredith Rosana Hoes RD, LDN;Jessica Hassell Done, MS, ACSM-CEP, Exercise Physiologist    Virtual Visit No    Medication changes reported     No    Fall or balance concerns reported    No    Tobacco Cessation No Change    Warm-up and Cool-down Performed on first and last piece of equipment    Resistance Training Performed Yes    VAD Patient? No    PAD/SET Patient? No      Pain Assessment   Currently in Pain? No/denies    Multiple Pain Sites No           Capillary Blood Glucose: No results found for this or any previous visit (from the past 24 hour(s)).    Social History   Tobacco Use  Smoking Status Former Smoker  . Packs/day: 1.50  . Years: 24.00  . Pack years: 36.00  . Types: Cigarettes  . Quit date: 07/06/1987  . Years since quitting: 32.4  Smokeless Tobacco Never Used    Goals Met:  Exercise tolerated well No report of cardiac concerns or symptoms Strength training completed today  Goals Unmet:  Not Applicable  Comments: Service time is from 1035 to 1135    Dr. Fransico Him is Medical Director for Cardiac Rehab at [facility].

## 2019-12-20 NOTE — Progress Notes (Signed)
[name] 69 y.o. male Nutrition Note  Visit Diagnosis: Interstitial lung disease [facility])  Past Medical History:  Diagnosis Date  . Arthritis   . Bronchitis    history of  . 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 GERD (gastroesophageal reflux disease)   . Hiatal hernia   . 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 History of echocardiogram    Echo 11/2019: EF 65-70, no R WMA, mild concentric LVH, normal RV SF, normal PASP (RVSP 19.2 mmHg), trivial MR  . Hypercalcemia   . Lipoma    left forearm (3)  . Mitral valve prolapse    does not see cardiologist for. last stress test 2003  . Pneumonia 2009  . Tumor cells, benign    lung, left side  . Unstable angina ([place]) 10/28/2017     Medications reviewed.   Current Outpatient Medications:  .  aspirin 81 MG chewable tablet, Chew 81 mg by mouth daily., Disp: , Rfl:  .  famotidine (PEPCID) 20 MG tablet, Take 1 tablet (20 mg total) by mouth at bedtime., Disp: 30 tablet, Rfl: 3 .  FLUoxetine (PROZAC) 40 MG capsule, Take 40 mg by mouth every morning. , Disp: , Rfl:  .  methylphenidate (RITALIN) 10 MG tablet, Take 10 mg by mouth 3 (three) times daily.  , Disp: , Rfl:  .  metoprolol succinate (TOPROL XL) 25 MG 24 hr tablet, Take 1 tablet (25 mg total) by mouth daily., Disp: 90 tablet, Rfl: 3 .  mirtazapine (REMERON) 15 MG tablet, Take 15 mg by mouth at bedtime., Disp: , Rfl:  .  Multiple Vitamin (MULTIVITAMIN) tablet, Take 1 tablet by mouth daily., Disp: , Rfl:  .  nitroGLYCERIN (NITROSTAT) 0.4 MG SL tablet, Place 0.4 mg under the tongue every 5 (five) minutes as needed for chest pain. , Disp: , Rfl:  .  omeprazole (PRILOSEC) 20 MG capsule, Take 20 mg by mouth 2 (two) times daily before a meal. , Disp: , Rfl:  .  polyethylene glycol (MIRALAX / GLYCOLAX) packet, Take 17 g by mouth daily as needed (for constipation.). , Disp: , Rfl:  .  rosuvastatin (CRESTOR) 10 MG tablet, TAKE 1 TABLET(10 MG) BY MOUTH DAILY, Disp: 90 tablet, Rfl: 3 .   SYNTHROID 25 MCG tablet, Take 25 mcg by mouth daily before breakfast. , Disp: , Rfl:  .  tamsulosin (FLOMAX) 0.4 MG CAPS capsule, Take 0.4 mg by mouth daily after breakfast. , Disp: , Rfl:  .  Tiotropium Bromide Monohydrate (SPIRIVA RESPIMAT) 2.5 MCG/ACT AERS, Inhale 2 puffs into the lungs daily., Disp: 4 g, Rfl: 11   Ht Readings from Last 1 Encounters:  12/12/19 5\' 11"  (1.803 m)     Wt Readings from Last 3 Encounters:  12/18/19 173 lb 15.1 oz (78.9 kg)  12/12/19 175 lb 11.3 oz (79.7 kg)  10/29/19 174 lb 9.6 oz (79.2 kg)     There is no height or weight on file to calculate BMI.   Social History   Tobacco Use  Smoking Status Former Smoker  . Packs/day: 1.50  . Years: 24.00  . Pack years: 36.00  . Types: Cigarettes  . Quit date: 07/06/1987  . Years since quitting: 32.4  Smokeless Tobacco Never Used     Nutrition Note  Spoke with pt. Nutrition Plan and Nutrition Survey goals reviewed with pt.  He has a generally healthy diet.  Pt has Pre-diabetes. Last A1c indicates blood glucose well-controlled. He was aware of  his most recent A1C. He feels he could modify his afternoon ice cream intake. We reviewed carbohydrate effect on CBGs.  Pt expressed understanding of the information reviewed.    Nutrition Diagnosis Excessive carbohydrate intake related to food preferences and lack of food related knowledge as evidenced by A1C 5.9   Nutrition Intervention ? Pt's individual nutrition plan reviewed with pt. ? Benefits of adopting healthy diet reviewed with Rate My Plate survey ? Continue client-centered nutrition education by RD, as part of interdisciplinary care.  Goal(s) ? Pt to maintain current healthy diet  ? Pt to reduce afternoon ice cream portions    Plan:   Will provide client-centered nutrition education as part of interdisciplinary care  Monitor and evaluate progress toward nutrition goal with team.   Michaele Offer, MS, RDN, LDN

## 2019-12-25 ENCOUNTER — Other Ambulatory Visit: Payer: Self-pay

## 2019-12-25 ENCOUNTER — Encounter (HOSPITAL_COMMUNITY)
Admission: RE | Admit: 2019-12-25 | Discharge: 2019-12-25 | Disposition: A | Discharge status: Home / Self Care | Payer: Medicare Other | Source: Ambulatory Visit | Attending: Internal Medicine | Admitting: Internal Medicine

## 2019-12-25 DIAGNOSIS — J849 Interstitial pulmonary disease, unspecified: Secondary | ICD-10-CM

## 2019-12-25 NOTE — Progress Notes (Signed)
Daily Session Note  Patient Details  Name: [name] MRN: [id number] Date of Birth: 08/26/1950 Referring Provider:     Pulmonary Rehab Walk Test from 12/12/2019 in [place]  Referring Provider Brand Males, MD      Encounter Date: 12/25/2019  Check In:  Session Check In - 12/25/19 1151      Check-In   Supervising physician immediately available to respond to emergencies [facility] immediately available    Physician(s) Dr. Zigmund Daniel    [facility]    Staff Present Rosebud Poles, RN, Isaac Laud, MS, ACSM-CEP, Exercise Physiologist;Lisa Ysidro Evert, RN    Virtual Visit No    Medication changes reported     No    Fall or balance concerns reported    No    Tobacco Cessation No Change    Warm-up and Cool-down Performed as group-led instruction    Resistance Training Performed Yes    VAD Patient? No    PAD/SET Patient? No      Pain Assessment   Currently in Pain? No/denies    Multiple Pain Sites No           Capillary Blood Glucose: No results found for this or any previous visit (from the past 24 hour(s)).    Social History   Tobacco Use  Smoking Status Former Smoker  . Packs/day: 1.50  . Years: 24.00  . Pack years: 36.00  . Types: Cigarettes  . Quit date: 07/06/1987  . Years since quitting: 32.4  Smokeless Tobacco Never Used    Goals Met:  Proper associated with RPD/PD & O2 Sat Exercise tolerated well Strength training completed today  Goals Unmet:  Not Applicable  Comments: Service time is from 1035 to 1140    Dr. Fransico Him is Medical Director for Cardiac Rehab at [facility].

## 2019-12-27 ENCOUNTER — Other Ambulatory Visit: Payer: Self-pay

## 2019-12-27 ENCOUNTER — Encounter (HOSPITAL_COMMUNITY)
Admission: RE | Admit: 2019-12-27 | Discharge: 2019-12-27 | Disposition: A | Discharge status: Home / Self Care | Payer: Medicare Other | Source: Ambulatory Visit | Attending: Internal Medicine | Admitting: Internal Medicine

## 2019-12-27 DIAGNOSIS — J849 Interstitial pulmonary disease, unspecified: Secondary | ICD-10-CM | POA: Diagnosis not present

## 2019-12-27 NOTE — Progress Notes (Signed)
Daily Session Note  Patient Details  Name: [name] MRN: [id number] Date of Birth: Dec 30, 1950 Referring Provider:     Pulmonary Rehab Walk Test from 12/12/2019 in [place]  Referring Provider Brand Males, MD      Encounter Date: 12/27/2019  Check In:  Session Check In - 12/27/19 1112      Check-In   Supervising physician immediately available to respond to emergencies [facility] immediately available    Physician(s) Dr. Zigmund Daniel    [facility]    Staff Present Rosebud Poles, RN, Bjorn Loser, MS, CEP, Exercise Physiologist;Kashvi Prevette Hassell Done, MS, ACSM-CEP, Exercise Physiologist    Virtual Visit No    Medication changes reported     No    Fall or balance concerns reported    No    Tobacco Cessation No Change    Warm-up and Cool-down Performed as group-led instruction    Resistance Training Performed Yes    VAD Patient? No    PAD/SET Patient? No      Pain Assessment   Currently in Pain? No/denies    Multiple Pain Sites No           Capillary Blood Glucose: No results found for this or any previous visit (from the past 24 hour(s)).    Social History   Tobacco Use  Smoking Status Former Smoker  . Packs/day: 1.50  . Years: 24.00  . Pack years: 36.00  . Types: Cigarettes  . Quit date: 07/06/1987  . Years since quitting: 32.4  Smokeless Tobacco Never Used    Goals Met:  Proper associated with RPD/PD & O2 Sat Independence with exercise equipment Exercise tolerated well Strength training completed today  Goals Unmet:  Not Applicable  Comments:Service time is from 1025 to 1120   Dr. Fransico Him is Medical Director for Cardiac Rehab at [facility].

## 2020-01-01 ENCOUNTER — Encounter (HOSPITAL_COMMUNITY)
Admission: RE | Admit: 2020-01-01 | Discharge: 2020-01-01 | Disposition: A | Discharge status: Home / Self Care | Payer: Medicare Other | Source: Ambulatory Visit | Attending: Internal Medicine | Admitting: Internal Medicine

## 2020-01-01 ENCOUNTER — Other Ambulatory Visit: Payer: Self-pay

## 2020-01-01 VITALS — Wt 174.8 lb

## 2020-01-01 DIAGNOSIS — J849 Interstitial pulmonary disease, unspecified: Secondary | ICD-10-CM | POA: Diagnosis not present

## 2020-01-01 NOTE — Progress Notes (Signed)
Daily Session Note  Patient Details  Name: [name] MRN: [id number] Date of Birth: 1950/10/16 Referring Provider:     Pulmonary Rehab Walk Test from 12/12/2019 in [place]  Referring Provider Brand Males, MD      Encounter Date: 01/01/2020  Check In:  Session Check In - 01/01/20 1033      Check-In   Supervising physician immediately available to respond to emergencies [facility] immediately available    Physician(s) Dr. Barth Kirks    [facility]    Staff Present Rosebud Poles, RN, Bjorn Loser, MS, CEP, Exercise Physiologist;Jessica Hassell Done, MS, ACSM-CEP, Exercise Physiologist;Lisa Ysidro Evert, RN    Virtual Visit No    Medication changes reported     No    Fall or balance concerns reported    No    Tobacco Cessation No Change    Warm-up and Cool-down Performed as group-led instruction    Resistance Training Performed Yes    VAD Patient? No    PAD/SET Patient? No      Pain Assessment   Currently in Pain? No/denies    Multiple Pain Sites No           Capillary Blood Glucose: No results found for this or any previous visit (from the past 24 hour(s)).   Exercise Prescription Changes - 01/01/20 1100      Response to Exercise   Blood Pressure (Admit) 100/56    Blood Pressure (Exercise) 110/60    Blood Pressure (Exit) 116/78    Heart Rate (Admit) 92 bpm    Heart Rate (Exercise) 96 bpm    Heart Rate (Exit) 90 bpm    Oxygen Saturation (Admit) 98 %    Oxygen Saturation (Exercise) 96 %    Oxygen Saturation (Exit) 98 %    Rating of Perceived Exertion (Exercise) 11.5    Perceived Dyspnea (Exercise) 1    Duration Continue with 30 min of aerobic exercise without signs/symptoms of physical distress.    Intensity THRR unchanged      Progression   Progression Continue to progress workloads to maintain intensity without signs/symptoms of physical distress.      Resistance Training   Training  Prescription Yes    Weight blue band    Reps 10-15    Time 10 Minutes      Recumbant Bike   Level 2.5    Minutes 15    METs 4.5      NuStep   Level 3    SPM 80    Minutes 15    METs 2.5      Home Exercise Plan   Plans to continue exercise at Home (comment)    Frequency Add 3 additional days to program exercise sessions.    Initial Home Exercises Provided 01/01/20           Social History   Tobacco Use  Smoking Status Former Smoker  . Packs/day: 1.50  . Years: 24.00  . Pack years: 36.00  . Types: Cigarettes  . Quit date: 07/06/1987  . Years since quitting: 32.5  Smokeless Tobacco Never Used    Goals Met:  Independence with exercise equipment Exercise tolerated well Strength training completed today  Goals Unmet:  Not Applicable  Comments: Service time is from 1033 to 1135    Dr. Fransico Him is Medical Director for Cardiac Rehab at [facility].

## 2020-01-01 NOTE — Progress Notes (Signed)
I have reviewed a Home Exercise Prescription with [name] . Jonathan is  currently exercising at home.  The patient was advised to walk and play golf 3 days a week for 45-60 minutes.  Ebbie and I discussed how to progress their exercise prescription.  The patient stated that their goals were to be as fit as he can be.  The patient stated that they understand the exercise prescription.  We reviewed exercise guidelines, target heart rate during exercise, RPE Scale, weather conditions, NTG use, endpoints for exercise, warmup and cool down.  Patient is encouraged to come to me with any questions. I will continue to follow up with the patient to assist them with progression and safety.

## 2020-01-03 ENCOUNTER — Other Ambulatory Visit: Payer: Self-pay

## 2020-01-03 ENCOUNTER — Encounter (HOSPITAL_COMMUNITY)
Admission: RE | Admit: 2020-01-03 | Discharge: 2020-01-03 | Disposition: A | Discharge status: Home / Self Care | Payer: Medicare Other | Source: Ambulatory Visit | Attending: Internal Medicine | Admitting: Internal Medicine

## 2020-01-03 DIAGNOSIS — J849 Interstitial pulmonary disease, unspecified: Secondary | ICD-10-CM | POA: Diagnosis not present

## 2020-01-03 NOTE — Progress Notes (Signed)
Daily Session Note  Patient Details  Name: [name] MRN: [id number] Date of Birth: 12/04/1950 Referring Provider:     Pulmonary Rehab Walk Test from 12/12/2019 in [place]  Referring Provider Richard Males, MD      Encounter Date: 01/03/2020  Check In:  Session Check In - 01/03/20 1049      Check-In   Supervising physician immediately available to respond to emergencies [facility] immediately available    Physician(s) [name]    [facility]    Staff Present Richard Poles, RN, Richard Loser, MS, CEP, Exercise Physiologist;Richard Hassell Done, MS, ACSM-CEP, Exercise Physiologist;Richard Hagin Ysidro Evert, RN    Virtual Visit No    Medication changes reported     No    Fall or balance concerns reported    No    Tobacco Cessation No Change    Warm-up and Cool-down Performed as group-led instruction    Resistance Training Performed Yes    VAD Patient? No    PAD/SET Patient? No      Pain Assessment   Currently in Pain? No/denies    Multiple Pain Sites No           Capillary Blood Glucose: No results found for this or any previous visit (from the past 24 hour(s)).    Social History   Tobacco Use  Smoking Status Former Smoker  . Packs/day: 1.50  . Years: 24.00  . Pack years: 36.00  . Types: Cigarettes  . Quit date: 07/06/1987  . Years since quitting: 32.5  Smokeless Tobacco Never Used    Goals Met:  Exercise tolerated well No report of cardiac concerns or symptoms Strength training completed today  Goals Unmet:  Not Applicable  Comments: Service time is from 1048 to 1140    [name] is Medical Director for Cardiac Rehab at [facility].

## 2020-01-08 ENCOUNTER — Encounter (HOSPITAL_COMMUNITY)
Admission: RE | Admit: 2020-01-08 | Discharge: 2020-01-08 | Disposition: A | Discharge status: Home / Self Care | Payer: Medicare Other | Source: Ambulatory Visit | Attending: Internal Medicine | Admitting: Internal Medicine

## 2020-01-08 ENCOUNTER — Other Ambulatory Visit: Payer: Self-pay

## 2020-01-08 VITALS — Wt 176.6 lb

## 2020-01-08 DIAGNOSIS — J849 Interstitial pulmonary disease, unspecified: Secondary | ICD-10-CM

## 2020-01-08 NOTE — Progress Notes (Signed)
Daily Session Note  Patient Details  Name: [name] MRN: [id number] Date of Birth: 03-20-1951 Referring Provider:     Pulmonary Rehab Walk Test from 12/12/2019 in [place]  Referring Provider Brand Males, MD      Encounter Date: 01/08/2020  Check In:  Session Check In - 01/08/20 1053      Check-In   Supervising physician immediately available to respond to emergencies [facility] immediately available    Physician(s) Dr. Rodena Piety    [facility]    Staff Present Rosebud Poles, RN, BSN;Carlette Wilber Oliphant, RN, Roque Cash, RN    Virtual Visit No    Medication changes reported     No    Fall or balance concerns reported    No    Tobacco Cessation No Change    Warm-up and Cool-down Performed as group-led instruction    Resistance Training Performed Yes    VAD Patient? No    PAD/SET Patient? No      Pain Assessment   Currently in Pain? No/denies    Multiple Pain Sites No           Capillary Blood Glucose: No results found for this or any previous visit (from the past 24 hour(s)).    Social History   Tobacco Use  Smoking Status Former Smoker   Packs/day: 1.50   Years: 24.00   Pack years: 36.00   Types: Cigarettes   Quit date: 07/06/1987   Years since quitting: 32.5  Smokeless Tobacco Never Used    Goals Met:  Proper associated with RPD/PD & O2 Sat Exercise tolerated well Strength training completed today  Goals Unmet:  Not Applicable  Comments: Service time is from 1045 to 1200    Dr. Fransico Him is Medical Director for Cardiac Rehab at [facility].

## 2020-01-10 ENCOUNTER — Encounter (HOSPITAL_COMMUNITY): Payer: Medicare Other

## 2020-01-14 ENCOUNTER — Telehealth (HOSPITAL_COMMUNITY): Payer: Self-pay

## 2020-01-14 NOTE — Telephone Encounter (Signed)
Pt called and stated that he is going out of town and would miss a few days of pulmonary rehab 7/13, 7/15 and 7/20. Canceled those sessions for pt.

## 2020-01-15 ENCOUNTER — Encounter (HOSPITAL_COMMUNITY): Payer: Medicare Other

## 2020-01-17 ENCOUNTER — Encounter (HOSPITAL_COMMUNITY): Payer: Medicare Other

## 2020-01-17 NOTE — Progress Notes (Signed)
Pulmonary Individual Treatment Plan  Patient Details  Name: [name] MRN: [id number] Date of Birth: October 08, 1950 Referring Provider:     Pulmonary Rehab Walk Test from 12/12/2019 in [place]  Referring Provider Richard Males, MD      Initial Encounter Date:    Pulmonary Rehab Walk Test from 12/12/2019 in [place]  Date 12/12/19      Visit Diagnosis: Interstitial lung disease ([place])  Patient's Home Medications on Admission:   Current Outpatient Medications:  .  aspirin 81 MG chewable tablet, Chew 81 mg by mouth daily., Disp: , Rfl:  .  famotidine (PEPCID) 20 MG tablet, Take 1 tablet (20 mg total) by mouth at bedtime., Disp: 30 tablet, Rfl: 3 .  FLUoxetine (PROZAC) 40 MG capsule, Take 40 mg by mouth every morning. , Disp: , Rfl:  .  methylphenidate (RITALIN) 10 MG tablet, Take 10 mg by mouth 3 (three) times daily.  , Disp: , Rfl:  .  metoprolol succinate (TOPROL XL) 25 MG 24 hr tablet, Take 1 tablet (25 mg total) by mouth daily., Disp: 90 tablet, Rfl: 3 .  mirtazapine (REMERON) 15 MG tablet, Take 15 mg by mouth at bedtime., Disp: , Rfl:  .  Multiple Vitamin (MULTIVITAMIN) tablet, Take 1 tablet by mouth daily., Disp: , Rfl:  .  nitroGLYCERIN (NITROSTAT) 0.4 MG SL tablet, Place 0.4 mg under the tongue every 5 (five) minutes as needed for chest pain. , Disp: , Rfl:  .  omeprazole (PRILOSEC) 20 MG capsule, Take 20 mg by mouth 2 (two) times daily before a meal. , Disp: , Rfl:  .  polyethylene glycol (MIRALAX / GLYCOLAX) packet, Take 17 g by mouth daily as needed (for constipation.). , Disp: , Rfl:  .  rosuvastatin (CRESTOR) 10 MG tablet, TAKE 1 TABLET(10 MG) BY MOUTH DAILY, Disp: 90 tablet, Rfl: 3 .  SYNTHROID 25 MCG tablet, Take 25 mcg by mouth daily before breakfast. , Disp: , Rfl:  .  tamsulosin (FLOMAX) 0.4 MG CAPS capsule, Take 0.4 mg by mouth daily after breakfast. , Disp: , Rfl:  .  Tiotropium Bromide  Monohydrate (SPIRIVA RESPIMAT) 2.5 MCG/ACT AERS, Inhale 2 puffs into the lungs daily., Disp: 4 g, Rfl: 11  Past Medical History: Past Medical History:  Diagnosis Date  . Arthritis   . Bronchitis    history of  . 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 GERD (gastroesophageal reflux disease)   . Hiatal hernia   . 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 History of echocardiogram    Echo 11/2019: EF 65-70, no R WMA, mild concentric LVH, normal RV SF, normal PASP (RVSP 19.2 mmHg), trivial MR  . Hypercalcemia   . Lipoma    left forearm (3)  . Mitral valve prolapse    does not see cardiologist for. last stress test 2003  . Pneumonia 2009  . Tumor cells, benign    lung, left side  . Unstable angina ([place]) 10/28/2017    Tobacco Use: Social History   Tobacco Use  Smoking Status Former Smoker  . Packs/day: 1.50  . Years: 24.00  . Pack years: 36.00  . Types: Cigarettes  . Quit date: 07/06/1987  . Years since quitting: 32.5  Smokeless Tobacco Never Used    Labs: Recent Chemical engineer    Labs for ITP Cardiac and Pulmonary Rehab Latest Ref Rng & Units 10/21/2015 10/28/2017 10/29/2017 02/13/2018 12/29/2018   Cholestrol  100 - 199 mg/dL 189 - 128 134 -   LDLCALC 0 - 99 mg/dL 124(H) - 76 66 -   HDL >39 mg/dL 43 - 38(L) 41 -   Trlycerides 0 - 149 mg/dL 110 - 69 137 -   Hemoglobin A1c 4.8 - 5.6 % 5.9(H) 5.4 - - -   TCO2 22 - 32 mmol/L - - - - 30      Capillary Blood Glucose: Lab Results  Component Value Date   GLUCAP 152 (H) 12/29/2018   GLUCAP 136 (H) 03/15/2016   GLUCAP 102 (H) 09/05/2015   GLUCAP 84 09/05/2015   GLUCAP 121 (H) 09/04/2015     Pulmonary Assessment Scores:  Pulmonary Assessment Scores    Row Name 12/12/19 1203 12/12/19 1204       ADL UCSD   ADL Phase -- Entry    SOB Score total -- 11      CAT Score   CAT Score -- 7      mMRC Score   mMRC Score 1 --          UCSD: Self-administered rating of dyspnea associated with activities of daily living  (ADLs) 6-point scale (0 = "not at all" to 5 = "maximal or unable to do because of breathlessness")  Scoring Scores range from 0 to 120.  Minimally important difference is 5 units  CAT: CAT can identify the health impairment of COPD patients and is better correlated with disease progression.  CAT has a scoring range of zero to 40. The CAT score is classified into four groups of low (less than 10), medium (10 - 20), high (21-30) and very high (31-40) based on the impact level of disease on health status. A CAT score over 10 suggests significant symptoms.  A worsening CAT score could be explained by an exacerbation, poor medication adherence, poor inhaler technique, or progression of COPD or comorbid conditions.  CAT MCID is 2 points  mMRC: mMRC (Modified Medical Research Council) Dyspnea Scale is used to assess the degree of baseline functional disability in patients of respiratory disease due to dyspnea. No minimal important difference is established. A decrease in score of 1 point or greater is considered a positive change.   Pulmonary Function Assessment:   Exercise Target Goals: Exercise Program Goal: Individual exercise prescription set using results from initial 6 min walk test and THRR while considering  patient's activity barriers and safety.   Exercise Prescription Goal: Initial exercise prescription builds to 30-45 minutes a day of aerobic activity, 2-3 days per week.  Home exercise guidelines will be given to patient during program as part of exercise prescription that the participant will acknowledge.  Activity Barriers & Risk Stratification:  Activity Barriers & Cardiac Risk Stratification - 12/12/19 1118      Activity Barriers & Cardiac Risk Stratification   Activity Barriers Arthritis;Deconditioning;Left Hip Replacement;Shortness of Breath;Muscular Weakness;History of Falls           6 Minute Walk:  6 Minute Walk    Row Name 12/12/19 1132         6 Minute Walk    Phase Initial     Distance 1471 feet     Walk Time 6 minutes     # of Rest Breaks 0     MPH 2.79     METS 3.53     RPE 11     Perceived Dyspnea  1     VO2 Peak 12.35     Symptoms No  Resting HR 71 bpm     Resting BP 110/70     Resting Oxygen Saturation  97 %     Exercise Oxygen Saturation  during 6 min walk 96 %     Max Ex. HR 111 bpm     Max Ex. BP 120/72     2 Minute Post BP 136/72       Interval HR   1 Minute HR 100     2 Minute HR 107     3 Minute HR 111     4 Minute HR 110     5 Minute HR 108     6 Minute HR 109     2 Minute Post HR 86     Interval Heart Rate? Yes       Interval Oxygen   Interval Oxygen? Yes     Baseline Oxygen Saturation % 97 %     1 Minute Oxygen Saturation % 97 %     1 Minute Liters of Oxygen 0 L     2 Minute Oxygen Saturation % 96 %     2 Minute Liters of Oxygen 0 L     3 Minute Oxygen Saturation % 97 %     3 Minute Liters of Oxygen 0 L     4 Minute Oxygen Saturation % 98 %     4 Minute Liters of Oxygen 0 L     5 Minute Oxygen Saturation % 97 %     5 Minute Liters of Oxygen 0 L     6 Minute Oxygen Saturation % 98 %     6 Minute Liters of Oxygen 0 L     2 Minute Post Oxygen Saturation % 99 %     2 Minute Post Liters of Oxygen 0 L            Oxygen Initial Assessment:  Oxygen Initial Assessment - 12/12/19 1121      Home Oxygen   Home Oxygen Device None    Sleep Oxygen Prescription None    Home Exercise Oxygen Prescription None    Home at Rest Exercise Oxygen Prescription None    Compliance with Home Oxygen Use No      Initial 6 min Walk   Oxygen Used None      Program Oxygen Prescription   Program Oxygen Prescription None      Intervention   Short Term Goals To learn and exhibit compliance with exercise, home and travel O2 prescription;To learn and understand importance of maintaining oxygen saturations>88%;To learn and demonstrate proper use of respiratory medications;To learn and understand importance of monitoring SPO2  with pulse oximeter and demonstrate accurate use of the pulse oximeter.;To learn and demonstrate proper pursed lip breathing techniques or other breathing techniques.;Other    Long  Term Goals Exhibits compliance with exercise, home and travel O2 prescription;Verbalizes importance of monitoring SPO2 with pulse oximeter and return demonstration;Maintenance of O2 saturations>88%;Exhibits proper breathing techniques, such as pursed lip breathing or other method taught during program session;Compliance with respiratory medication;Other           Oxygen Re-Evaluation:  Oxygen Re-Evaluation    Row Name 12/18/19 1442 01/15/20 0717           Program Oxygen Prescription   Program Oxygen Prescription None None        Home Oxygen   Home Oxygen Device None None      Sleep Oxygen Prescription None None      Home  Exercise Oxygen Prescription None None      Home at Rest Exercise Oxygen Prescription None None      Compliance with Home Oxygen Use Yes Yes        Goals/Expected Outcomes   Short Term Goals To learn and exhibit compliance with exercise, home and travel O2 prescription;To learn and understand importance of maintaining oxygen saturations>88%;To learn and demonstrate proper use of respiratory medications;To learn and understand importance of monitoring SPO2 with pulse oximeter and demonstrate accurate use of the pulse oximeter.;To learn and demonstrate proper pursed lip breathing techniques or other breathing techniques.;Other To learn and exhibit compliance with exercise, home and travel O2 prescription;To learn and understand importance of maintaining oxygen saturations>88%;To learn and demonstrate proper use of respiratory medications;To learn and understand importance of monitoring SPO2 with pulse oximeter and demonstrate accurate use of the pulse oximeter.;To learn and demonstrate proper pursed lip breathing techniques or other breathing techniques.;Other      Long  Term Goals Exhibits  compliance with exercise, home and travel O2 prescription;Verbalizes importance of monitoring SPO2 with pulse oximeter and return demonstration;Maintenance of O2 saturations>88%;Exhibits proper breathing techniques, such as pursed lip breathing or other method taught during program session;Compliance with respiratory medication;Other Exhibits compliance with exercise, home and travel O2 prescription;Verbalizes importance of monitoring SPO2 with pulse oximeter and return demonstration;Maintenance of O2 saturations>88%;Exhibits proper breathing techniques, such as pursed lip breathing or other method taught during program session;Compliance with respiratory medication;Other      Goals/Expected Outcomes compliance compliance             Oxygen Discharge (Final Oxygen Re-Evaluation):  Oxygen Re-Evaluation - 01/15/20 0717      Program Oxygen Prescription   Program Oxygen Prescription None      Home Oxygen   Home Oxygen Device None    Sleep Oxygen Prescription None    Home Exercise Oxygen Prescription None    Home at Rest Exercise Oxygen Prescription None    Compliance with Home Oxygen Use Yes      Goals/Expected Outcomes   Short Term Goals To learn and exhibit compliance with exercise, home and travel O2 prescription;To learn and understand importance of maintaining oxygen saturations>88%;To learn and demonstrate proper use of respiratory medications;To learn and understand importance of monitoring SPO2 with pulse oximeter and demonstrate accurate use of the pulse oximeter.;To learn and demonstrate proper pursed lip breathing techniques or other breathing techniques.;Other    Long  Term Goals Exhibits compliance with exercise, home and travel O2 prescription;Verbalizes importance of monitoring SPO2 with pulse oximeter and return demonstration;Maintenance of O2 saturations>88%;Exhibits proper breathing techniques, such as pursed lip breathing or other method taught during program  session;Compliance with respiratory medication;Other    Goals/Expected Outcomes compliance           Initial Exercise Prescription:  Initial Exercise Prescription - 12/12/19 1100      Date of Initial Exercise RX and Referring Provider   Date 12/12/19    Referring Provider Richard Males, MD      Recumbant Bike   Level 2    Watts 35    Minutes 15    METs 3.38      NuStep   Level 3    SPM 85    Minutes 15    METs 3.3      Prescription Details   Frequency (times per week) 2    Duration Progress to 30 minutes of continuous aerobic without signs/symptoms of physical distress      Intensity  THRR 40-80% of Max Heartrate 60-121    Ratings of Perceived Exertion 11-13    Perceived Dyspnea 0-4      Progression   Progression Continue to progress workloads to maintain intensity without signs/symptoms of physical distress.      Resistance Training   Training Prescription Yes    Weight --   blue bands   Reps 10-15           Perform Capillary Blood Glucose checks as needed.  Exercise Prescription Changes:  Exercise Prescription Changes    Row Name 12/18/19 1400 01/01/20 1100 01/08/20 1148         Response to Exercise   Blood Pressure (Admit) 104/60 100/56 106/60     Blood Pressure (Exercise) 136/74 110/60 --     Blood Pressure (Exit) 116/78 116/78 134/76     Heart Rate (Admit) 77 bpm 92 bpm 76 bpm     Heart Rate (Exercise) 103 bpm 96 bpm 111 bpm     Heart Rate (Exit) 95 bpm 90 bpm 88 bpm     Oxygen Saturation (Admit) 98 % 98 % 99 %     Oxygen Saturation (Exercise) 96 % 96 % 97 %     Oxygen Saturation (Exit) 97 % 98 % 98 %     Rating of Perceived Exertion (Exercise) 14 11.5 12.5     Perceived Dyspnea (Exercise) 2 1 2      Duration Continue with 30 min of aerobic exercise without signs/symptoms of physical distress. Continue with 30 min of aerobic exercise without signs/symptoms of physical distress. Continue with 30 min of aerobic exercise without signs/symptoms  of physical distress.     Intensity THRR unchanged THRR unchanged THRR unchanged       Progression   Progression Continue to progress workloads to maintain intensity without signs/symptoms of physical distress. Continue to progress workloads to maintain intensity without signs/symptoms of physical distress. Continue to progress workloads to maintain intensity without signs/symptoms of physical distress.       Resistance Training   Training Prescription Yes Yes Yes     Weight blue bands blue band blue bands     Reps 10-15 10-15 10-15     Time 10 Minutes 10 Minutes 10 Minutes       Recumbant Bike   Level 1.5 2.5 3     Minutes 15 15 15      METs 3.6 4.5 6.1       NuStep   Level 3 3 4      SPM 80 80 80     Minutes 15 15 15      METs 2.2 2.5 3.1       Home Exercise Plan   Plans to continue exercise at -- Home (comment) --     Frequency -- Add 3 additional days to program exercise sessions. --     Initial Home Exercises Provided -- 01/01/20 --            Exercise Comments:  Exercise Comments    Row Name 12/12/19 1157 12/18/19 1429 01/01/20 1159       Exercise Comments Patient is currently walking 45-60 minutes 2-5 days/week. Pt completed first day of exercise today with no complaints. He tolerated exercise well. Home exercise complete.            Exercise Goals and Review:  Exercise Goals    Row Name 12/12/19 1201 12/18/19 1446 01/15/20 0718         Exercise Goals   Increase  Physical Activity Yes Yes Yes     Intervention Provide advice, education, support and counseling about physical activity/exercise needs.;Develop an individualized exercise prescription for aerobic and resistive training based on initial evaluation findings, risk stratification, comorbidities and participant's personal goals. Provide advice, education, support and counseling about physical activity/exercise needs.;Develop an individualized exercise prescription for aerobic and resistive training based on  initial evaluation findings, risk stratification, comorbidities and participant's personal goals. Provide advice, education, support and counseling about physical activity/exercise needs.;Develop an individualized exercise prescription for aerobic and resistive training based on initial evaluation findings, risk stratification, comorbidities and participant's personal goals.     Expected Outcomes Short Term: Attend rehab on a regular basis to increase amount of physical activity.;Long Term: Exercising regularly at least 3-5 days a week.;Long Term: Add in home exercise to make exercise part of routine and to increase amount of physical activity. Short Term: Attend rehab on a regular basis to increase amount of physical activity.;Long Term: Exercising regularly at least 3-5 days a week.;Long Term: Add in home exercise to make exercise part of routine and to increase amount of physical activity. Short Term: Attend rehab on a regular basis to increase amount of physical activity.;Long Term: Exercising regularly at least 3-5 days a week.;Long Term: Add in home exercise to make exercise part of routine and to increase amount of physical activity.     Increase Strength and Stamina Yes Yes Yes     Intervention Provide advice, education, support and counseling about physical activity/exercise needs.;Develop an individualized exercise prescription for aerobic and resistive training based on initial evaluation findings, risk stratification, comorbidities and participant's personal goals. Provide advice, education, support and counseling about physical activity/exercise needs.;Develop an individualized exercise prescription for aerobic and resistive training based on initial evaluation findings, risk stratification, comorbidities and participant's personal goals. Provide advice, education, support and counseling about physical activity/exercise needs.;Develop an individualized exercise prescription for aerobic and resistive  training based on initial evaluation findings, risk stratification, comorbidities and participant's personal goals.     Expected Outcomes Short Term: Increase workloads from initial exercise prescription for resistance, speed, and METs.;Short Term: Perform resistance training exercises routinely during rehab and add in resistance training at home;Long Term: Improve cardiorespiratory fitness, muscular endurance and strength as measured by increased METs and functional capacity (6MWT) Short Term: Increase workloads from initial exercise prescription for resistance, speed, and METs.;Short Term: Perform resistance training exercises routinely during rehab and add in resistance training at home;Long Term: Improve cardiorespiratory fitness, muscular endurance and strength as measured by increased METs and functional capacity (6MWT) Short Term: Increase workloads from initial exercise prescription for resistance, speed, and METs.;Short Term: Perform resistance training exercises routinely during rehab and add in resistance training at home;Long Term: Improve cardiorespiratory fitness, muscular endurance and strength as measured by increased METs and functional capacity (6MWT)     Able to understand and use rate of perceived exertion (RPE) scale Yes Yes Yes     Intervention Provide education and explanation on how to use RPE scale Provide education and explanation on how to use RPE scale Provide education and explanation on how to use RPE scale     Expected Outcomes Short Term: Able to use RPE daily in rehab to express subjective intensity level;Long Term:  Able to use RPE to guide intensity level when exercising independently Short Term: Able to use RPE daily in rehab to express subjective intensity level;Long Term:  Able to use RPE to guide intensity level when exercising independently Short Term: Able  to use RPE daily in rehab to express subjective intensity level;Long Term:  Able to use RPE to guide intensity level  when exercising independently     Able to understand and use Dyspnea scale Yes Yes Yes     Intervention Provide education and explanation on how to use Dyspnea scale Provide education and explanation on how to use Dyspnea scale Provide education and explanation on how to use Dyspnea scale     Expected Outcomes Short Term: Able to use Dyspnea scale daily in rehab to express subjective sense of shortness of breath during exertion;Long Term: Able to use Dyspnea scale to guide intensity level when exercising independently Short Term: Able to use Dyspnea scale daily in rehab to express subjective sense of shortness of breath during exertion;Long Term: Able to use Dyspnea scale to guide intensity level when exercising independently Short Term: Able to use Dyspnea scale daily in rehab to express subjective sense of shortness of breath during exertion;Long Term: Able to use Dyspnea scale to guide intensity level when exercising independently     Knowledge and understanding of Target Heart Rate Range (THRR) Yes Yes Yes     Intervention Provide education and explanation of THRR including how the numbers were predicted and where they are located for reference Provide education and explanation of THRR including how the numbers were predicted and where they are located for reference Provide education and explanation of THRR including how the numbers were predicted and where they are located for reference     Expected Outcomes Short Term: Able to state/look up THRR;Long Term: Able to use THRR to govern intensity when exercising independently;Short Term: Able to use daily as guideline for intensity in rehab Short Term: Able to state/look up THRR;Long Term: Able to use THRR to govern intensity when exercising independently;Short Term: Able to use daily as guideline for intensity in rehab Short Term: Able to state/look up THRR;Long Term: Able to use THRR to govern intensity when exercising independently;Short Term: Able to use  daily as guideline for intensity in rehab     Understanding of Exercise Prescription Yes Yes Yes     Intervention Provide education, explanation, and written materials on patient's individual exercise prescription Provide education, explanation, and written materials on patient's individual exercise prescription Provide education, explanation, and written materials on patient's individual exercise prescription     Expected Outcomes Short Term: Able to explain program exercise prescription;Long Term: Able to explain home exercise prescription to exercise independently Short Term: Able to explain program exercise prescription;Long Term: Able to explain home exercise prescription to exercise independently Short Term: Able to explain program exercise prescription;Long Term: Able to explain home exercise prescription to exercise independently            Exercise Goals Re-Evaluation :  Exercise Goals Re-Evaluation    Row Name 12/18/19 1444 01/15/20 0718           Exercise Goal Re-Evaluation   Exercise Goals Review Increase Physical Activity;Increase Strength and Stamina;Able to understand and use rate of perceived exertion (RPE) scale;Able to understand and use Dyspnea scale;Knowledge and understanding of Target Heart Rate Range (THRR);Understanding of Exercise Prescription Increase Physical Activity;Increase Strength and Stamina;Able to understand and use rate of perceived exertion (RPE) scale;Able to understand and use Dyspnea scale;Knowledge and understanding of Target Heart Rate Range (THRR);Understanding of Exercise Prescription      Comments Pt has completed 1 exercise session. He has a positive attitude and is eager to exercise. He exercised at 3.6 METs on  the recumbent bike. Will monitor and progress as able. Pt has completed 7 exercise sessions. He is eager for workload increases. He is exercising at 6.1 METs on th recumbent bike. Will continue to monitor and progress as able.      Expected  Outcomes Through exercise at rehab and at home, the patient will decrease shortness of breath with daily activities and feel confident in carrying out an exercise regime at home. Through exercise at rehab and at home, the patient will decrease shortness of breath with daily activities and feel confident in carrying out an exercise regime at home.             Discharge Exercise Prescription (Final Exercise Prescription Changes):  Exercise Prescription Changes - 01/08/20 1148      Response to Exercise   Blood Pressure (Admit) 106/60    Blood Pressure (Exit) 134/76    Heart Rate (Admit) 76 bpm    Heart Rate (Exercise) 111 bpm    Heart Rate (Exit) 88 bpm    Oxygen Saturation (Admit) 99 %    Oxygen Saturation (Exercise) 97 %    Oxygen Saturation (Exit) 98 %    Rating of Perceived Exertion (Exercise) 12.5    Perceived Dyspnea (Exercise) 2    Duration Continue with 30 min of aerobic exercise without signs/symptoms of physical distress.    Intensity THRR unchanged      Progression   Progression Continue to progress workloads to maintain intensity without signs/symptoms of physical distress.      Resistance Training   Training Prescription Yes    Weight blue bands    Reps 10-15    Time 10 Minutes      Recumbant Bike   Level 3    Minutes 15    METs 6.1      NuStep   Level 4    SPM 80    Minutes 15    METs 3.1           Nutrition:  Target Goals: Understanding of nutrition guidelines, daily intake of sodium 1500mg , cholesterol 200mg , calories 30% from fat and 7% or less from saturated fats, daily to have 5 or more servings of fruits and vegetables.  Biometrics:  Pre Biometrics - 12/12/19 1120      Pre Biometrics   Grip Strength 36 kg            Nutrition Therapy Plan and Nutrition Goals:  Nutrition Therapy & Goals - 12/20/19 1323      Nutrition Therapy   Diet Generally health/carb modified      Personal Nutrition Goals   Nutrition Goal Pt to maintain current  healthy diet    Personal Goal #2 Pt to reduce afternoon ice cream portions      Intervention Plan   Intervention Nutrition handout(s) given to patient.;Prescribe, educate and counsel regarding individualized specific dietary modifications aiming towards targeted core components such as weight, hypertension, lipid management, diabetes, heart failure and other comorbidities.    Expected Outcomes Short Term Goal: A plan has been developed with personal nutrition goals set during dietitian appointment.;Long Term Goal: Adherence to prescribed nutrition plan.           Nutrition Assessments:  Nutrition Assessments - 12/20/19 1324      Rate Your Plate Scores   Pre Score 61           Nutrition Goals Re-Evaluation:  Nutrition Goals Re-Evaluation    [place] Name 12/20/19 1324 01/15/20 1416  Goals   Current Weight 173 lb (78.5 kg) 176 lb 9.4 oz (80.1 kg)      Nutrition Goal Pt to maintain current healthy diet Pt to maintain current healthy diet        Personal Goal #2 Re-Evaluation   Personal Goal #2 Pt to reduce afternoon ice cream portions Pt to reduce afternoon ice cream portions             Nutrition Goals Discharge (Final Nutrition Goals Re-Evaluation):  Nutrition Goals Re-Evaluation - 01/15/20 1416      Goals   Current Weight 176 lb 9.4 oz (80.1 kg)    Nutrition Goal Pt to maintain current healthy diet      Personal Goal #2 Re-Evaluation   Personal Goal #2 Pt to reduce afternoon ice cream portions           Psychosocial: Target Goals: Acknowledge presence or absence of significant depression and/or stress, maximize coping skills, provide positive support system. Participant is able to verbalize types and ability to use techniques and skills needed for reducing stress and depression.  Initial Review & Psychosocial Screening:  Initial Psych Review & Screening - 12/12/19 1121      Initial Review   Current issues with History of Depression;Current Depression        Family Dynamics   Good Support System? Yes      Barriers   Psychosocial barriers to participate in program There are no identifiable barriers or psychosocial needs.      Screening Interventions   Interventions Encouraged to exercise           Quality of Life Scores:  Scores of 19 and below usually indicate a poorer quality of life in these areas.  A difference of  2-3 points is a clinically meaningful difference.  A difference of 2-3 points in the total score of the Quality of Life Index has been associated with significant improvement in overall quality of life, self-image, physical symptoms, and general health in studies assessing change in quality of life.  PHQ-9: Recent Review Flowsheet Data    Depression screen [facility] 2/9 12/12/2019   Decreased Interest 0   Down, Depressed, Hopeless 0   PHQ - 2 Score 0   Altered sleeping 3   Tired, decreased energy 3   Change in appetite 1   Feeling bad or failure about yourself  0   Trouble concentrating 0   Moving slowly or fidgety/restless 0   Suicidal thoughts 0   Difficult doing work/chores Not difficult at all     Interpretation of Total Score  Total Score Depression Severity:  1-4 = Minimal depression, 5-9 = Mild depression, 10-14 = Moderate depression, 15-19 = Moderately severe depression, 20-27 = Severe depression   Psychosocial Evaluation and Intervention:  Psychosocial Evaluation - 12/12/19 1122      Psychosocial Evaluation & Interventions   Interventions Encouraged to exercise with the program and follow exercise prescription    Continue Psychosocial Services  No Follow up required           Psychosocial Re-Evaluation:  Psychosocial Re-Evaluation    [place] Name 12/18/19 1538 01/14/20 1350           Psychosocial Re-Evaluation   Current issues with Current Depression;History of Depression None Identified      Comments Depression is currently stable, no barriers or concerns identified. No barriers or concerns  identified at this time.      Expected Outcomes For patient to be free of barriers  or psychosocial concerns while participating in pulmonary rehab. For Mikki Santee to continue to have no psychosocial concerns while participating in pulmonary rehab.      Interventions Encouraged to attend Pulmonary Rehabilitation for the exercise Encouraged to attend Pulmonary Rehabilitation for the exercise      Continue Psychosocial Services  No Follow up required No Follow up required             Psychosocial Discharge (Final Psychosocial Re-Evaluation):  Psychosocial Re-Evaluation - 01/14/20 1350      Psychosocial Re-Evaluation   Current issues with None Identified    Comments No barriers or concerns identified at this time.    Expected Outcomes For Mikki Santee to continue to have no psychosocial concerns while participating in pulmonary rehab.    Interventions Encouraged to attend Pulmonary Rehabilitation for the exercise    Continue Psychosocial Services  No Follow up required           Education: Education Goals: Education classes will be provided on a weekly basis, covering required topics. Participant will state understanding/return demonstration of topics presented.  Learning Barriers/Preferences:  Learning Barriers/Preferences - 12/12/19 1122      Learning Barriers/Preferences   Learning Barriers None    Learning Preferences Computer/Internet;Audio;Group Instruction;Individual Instruction;Pictoral;Skilled Demonstration;Verbal Instruction;Video;Written Material           Education Topics: Risk Factor Reduction:  -Group instruction that is supported by a PowerPoint presentation. Instructor discusses the definition of a risk factor, different risk factors for pulmonary disease, and how the heart and lungs work together.     Nutrition for Pulmonary Patient:  -Group instruction provided by PowerPoint slides, verbal discussion, and written materials to support subject matter. The instructor gives an  explanation and review of healthy diet recommendations, which includes a discussion on weight management, recommendations for fruit and vegetable consumption, as well as protein, fluid, caffeine, fiber, sodium, sugar, and alcohol. Tips for eating when patients are short of breath are discussed.   Pursed Lip Breathing:  -Group instruction that is supported by demonstration and informational handouts. Instructor discusses the benefits of pursed lip and diaphragmatic breathing and detailed demonstration on how to preform both.     PULMONARY REHAB OTHER RESPIRATORY from 01/01/2020 in [place]  Date 12/25/19  Educator Handout  Instruction Review Code --  [N/A]      Oxygen Safety:  -Group instruction provided by PowerPoint, verbal discussion, and written material to support subject matter. There is an overview of "What is Oxygen" and "Why do we need it".  Instructor also reviews how to create a safe environment for oxygen use, the importance of using oxygen as prescribed, and the risks of noncompliance. There is a brief discussion on traveling with oxygen and resources the patient may utilize.   Oxygen Equipment:  -Group instruction provided by [facility] Staff utilizing handouts, written materials, and equipment demonstrations.   Signs and Symptoms:  -Group instruction provided by written material and verbal discussion to support subject matter. Warning signs and symptoms of infection, stroke, and heart attack are reviewed and when to call the physician/911 reinforced. Tips for preventing the spread of infection discussed.   Advanced Directives:  -Group instruction provided by verbal instruction and written material to support subject matter. Instructor reviews Advanced Directive laws and proper instruction for filling out document.   Pulmonary Video:  -Group video education that reviews the importance of medication and oxygen compliance, exercise, good  nutrition, pulmonary hygiene, and pursed lip and diaphragmatic breathing for  the pulmonary patient.   Exercise for the Pulmonary Patient:  -Group instruction that is supported by a PowerPoint presentation. Instructor discusses benefits of exercise, core components of exercise, frequency, duration, and intensity of an exercise routine, importance of utilizing pulse oximetry during exercise, safety while exercising, and options of places to exercise outside of rehab.     Pulmonary Medications:  -Verbally interactive group education provided by instructor with focus on inhaled medications and proper administration.   Anatomy and Physiology of the Respiratory System and Intimacy:  -Group instruction provided by PowerPoint, verbal discussion, and written material to support subject matter. Instructor reviews respiratory cycle and anatomical components of the respiratory system and their functions. Instructor also reviews differences in obstructive and restrictive respiratory diseases with examples of each. Intimacy, Sex, and Sexuality differences are reviewed with a discussion on how relationships can change when diagnosed with pulmonary disease. Common sexual concerns are reviewed.   MD DAY -A group question and answer session with a medical doctor that allows participants to ask questions that relate to their pulmonary disease state.   OTHER EDUCATION -Group or individual verbal, written, or video instructions that support the educational goals of the pulmonary rehab program.   PULMONARY REHAB OTHER RESPIRATORY from 01/01/2020 in [place]  Date 01/01/20  Gwyndolyn Kaufman your numbers]  Educator handout  Instruction Review Code 2- Demonstrated Understanding      Holiday Eating Survival Tips:  -Group instruction provided by PowerPoint slides, verbal discussion, and written materials to support subject matter. The instructor gives patients tips, tricks, and techniques to  help them not only survive but enjoy the holidays despite the onslaught of food that accompanies the holidays.   Knowledge Questionnaire Score:  Knowledge Questionnaire Score - 12/12/19 1206      Knowledge Questionnaire Score   Pre Score 14/18           Core Components/Risk Factors/Patient Goals at Admission:  Personal Goals and Risk Factors at Admission - 12/12/19 1122      Core Components/Risk Factors/Patient Goals on Admission   Improve shortness of breath with ADL's Yes    Intervention Provide education, individualized exercise plan and daily activity instruction to help decrease symptoms of SOB with activities of daily living.    Expected Outcomes Short Term: Improve cardiorespiratory fitness to achieve a reduction of symptoms when performing ADLs;Long Term: Be able to perform more ADLs without symptoms or delay the onset of symptoms           Core Components/Risk Factors/Patient Goals Review:   Goals and Risk Factor Review    [place] Name 12/12/19 1123 12/12/19 1208 12/18/19 1539 01/14/20 1351       Core Components/Risk Factors/Patient Goals Review   Personal Goals Review Increase knowledge of respiratory medications and ability to use respiratory devices properly.;Improve shortness of breath with ADL's;Develop more efficient breathing techniques such as purse lipped breathing and diaphragmatic breathing and practicing self-pacing with activity. Increase knowledge of respiratory medications and ability to use respiratory devices properly.;Improve shortness of breath with ADL's;Develop more efficient breathing techniques such as purse lipped breathing and diaphragmatic breathing and practicing self-pacing with activity. Increase knowledge of respiratory medications and ability to use respiratory devices properly.;Improve shortness of breath with ADL's;Develop more efficient breathing techniques such as purse lipped breathing and diaphragmatic breathing and practicing self-pacing with  activity. Increase knowledge of respiratory medications and ability to use respiratory devices properly.;Improve shortness of breath with ADL's;Develop more efficient breathing techniques such as purse lipped  breathing and diaphragmatic breathing and practicing self-pacing with activity.    Review -- -- Mikki Santee just started pulmonary rehab today, too earlt to see progression towards goals. Mikki Santee works hard when he exercises, he is level 4 on the nustep exercising @ 3.1 mets, and is level 3 on the recumbent bike.  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    Expected Outcomes -- -- See admission goals. See admission goals           Core Components/Risk Factors/Patient Goals at Discharge (Final Review):   Goals and Risk Factor Review - 01/14/20 1351      Core Components/Risk Factors/Patient Goals Review   Personal Goals Review Increase knowledge of respiratory medications and ability to use respiratory devices properly.;Improve shortness of breath with ADL's;Develop more efficient breathing techniques such as purse lipped breathing and diaphragmatic breathing and practicing self-pacing with activity.    Review Mikki Santee works hard when he exercises, he is level 4 on the nustep exercising @ 3.1 mets, and is level 3 on the recumbent bike.  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    Expected Outcomes See admission goals           ITP Comments:   Comments: ITP REVIEW Pt is making expected progress toward pulmonary rehab goals after completing 7 sessions. Recommend continued exercise, life style modification, education, and utilization of breathing techniques to increase stamina and strength and decrease shortness of breath with exertion.

## 2020-01-22 ENCOUNTER — Encounter (HOSPITAL_COMMUNITY): Payer: Medicare Other

## 2020-01-24 ENCOUNTER — Other Ambulatory Visit: Payer: Self-pay

## 2020-01-24 ENCOUNTER — Telehealth: Payer: Self-pay | Admitting: Internal Medicine

## 2020-01-24 ENCOUNTER — Encounter (HOSPITAL_COMMUNITY)
Admission: RE | Admit: 2020-01-24 | Discharge: 2020-01-24 | Disposition: A | Discharge status: Home / Self Care | Payer: Medicare Other | Source: Ambulatory Visit | Attending: Internal Medicine | Admitting: Internal Medicine

## 2020-01-24 VITALS — Wt 177.0 lb

## 2020-01-24 DIAGNOSIS — J849 Interstitial pulmonary disease, unspecified: Secondary | ICD-10-CM

## 2020-01-24 NOTE — Progress Notes (Signed)
Daily Session Note  Patient Details  Name: KNOWLEDGE ESCANDON MRN: [id number] Date of Birth: 1951-05-22 Referring Provider:     Pulmonary Rehab Walk Test from 12/12/2019 in [place]  Referring Provider Brand Males, MD      Encounter Date: 01/24/2020  Check In:  Session Check In - 01/24/20 1139      Check-In   Supervising physician immediately available to respond to emergencies [facility] immediately available    Physician(s) Dr. Pietro Cassis    Location [facility]    Staff Present Rosebud Poles, RN, Bjorn Loser, MS, CEP, Exercise Physiologist;Lisa Jani Gravel, MS, ACSM-CEP, Exercise Physiologist    Virtual Visit No    Medication changes reported     No    Fall or balance concerns reported    No    Tobacco Cessation No Change    Warm-up and Cool-down Performed as group-led instruction    Resistance Training Performed Yes    VAD Patient? No    PAD/SET Patient? No      Pain Assessment   Currently in Pain? No/denies    Multiple Pain Sites No           Capillary Blood Glucose: No results found for this or any previous visit (from the past 24 hour(s)).    Social History   Tobacco Use  Smoking Status Former Smoker  . Packs/day: 1.50  . Years: 24.00  . Pack years: 36.00  . Types: Cigarettes  . Quit date: 07/06/1987  . Years since quitting: 32.5  Smokeless Tobacco Never Used    Goals Met:  Independence with exercise equipment Exercise tolerated well Strength training completed today  Goals Unmet:  Not Applicable  Comments: Service time is from 1035 to 1145    Dr. Fransico Him is Medical Director for Cardiac Rehab at [facility].

## 2020-01-24 NOTE — Telephone Encounter (Signed)
Patient is having Stiffness all over his body with some pain.  He states this has been going on for over 3 weeks now.  He would like to know what he can do about this and if it is associated with his ILD.   Mr please advise.

## 2020-01-25 NOTE — Telephone Encounter (Signed)
Those symptoms are probably from his arthritis as opposed to his ILD Plan  - he should talk to his PCP Richard Jordan, MD or rheumatologist

## 2020-01-25 NOTE — Telephone Encounter (Signed)
Spoke with the pt and notified of response per MR and he verbalized understanding.

## 2020-01-29 ENCOUNTER — Telehealth (HOSPITAL_COMMUNITY): Payer: Self-pay | Admitting: *Deleted

## 2020-01-29 ENCOUNTER — Encounter (HOSPITAL_COMMUNITY): Payer: Self-pay | Admitting: *Deleted

## 2020-01-29 ENCOUNTER — Encounter (HOSPITAL_COMMUNITY): Payer: Medicare Other

## 2020-01-29 ENCOUNTER — Telehealth (HOSPITAL_COMMUNITY): Payer: Self-pay | Admitting: Family Medicine

## 2020-01-31 ENCOUNTER — Encounter (HOSPITAL_COMMUNITY): Payer: Medicare Other

## 2020-01-31 ENCOUNTER — Telehealth (HOSPITAL_COMMUNITY): Payer: Self-pay | Admitting: *Deleted

## 2020-01-31 DIAGNOSIS — R05 Cough: Secondary | ICD-10-CM | POA: Diagnosis not present

## 2020-01-31 DIAGNOSIS — J988 Other specified respiratory disorders: Secondary | ICD-10-CM | POA: Diagnosis not present

## 2020-01-31 DIAGNOSIS — Z03818 Encounter for observation for suspected exposure to other biological agents ruled out: Secondary | ICD-10-CM | POA: Diagnosis not present

## 2020-01-31 DIAGNOSIS — R0609 Other forms of dyspnea: Secondary | ICD-10-CM | POA: Diagnosis not present

## 2020-01-31 DIAGNOSIS — R413 Other amnesia: Secondary | ICD-10-CM | POA: Diagnosis not present

## 2020-02-05 ENCOUNTER — Encounter (HOSPITAL_COMMUNITY): Payer: Medicare Other

## 2020-02-07 ENCOUNTER — Telehealth (HOSPITAL_COMMUNITY): Payer: Self-pay | Admitting: *Deleted

## 2020-02-07 ENCOUNTER — Encounter (HOSPITAL_COMMUNITY): Payer: Medicare Other

## 2020-02-07 ENCOUNTER — Telehealth (HOSPITAL_COMMUNITY): Payer: Self-pay | Admitting: Family Medicine

## 2020-02-12 ENCOUNTER — Encounter (HOSPITAL_COMMUNITY)
Admission: RE | Admit: 2020-02-12 | Discharge: 2020-02-12 | Disposition: A | Discharge status: Home / Self Care | Payer: Medicare Other | Source: Ambulatory Visit | Attending: Internal Medicine | Admitting: Internal Medicine

## 2020-02-12 ENCOUNTER — Telehealth (HOSPITAL_COMMUNITY): Payer: Self-pay | Admitting: Family Medicine

## 2020-02-12 DIAGNOSIS — J849 Interstitial pulmonary disease, unspecified: Secondary | ICD-10-CM | POA: Insufficient documentation

## 2020-02-13 ENCOUNTER — Encounter: Payer: Self-pay | Admitting: Adult Health

## 2020-02-13 ENCOUNTER — Encounter (HOSPITAL_COMMUNITY): Payer: Self-pay | Admitting: *Deleted

## 2020-02-13 ENCOUNTER — Ambulatory Visit (INDEPENDENT_AMBULATORY_CARE_PROVIDER_SITE_OTHER): Payer: Medicare Other

## 2020-02-13 ENCOUNTER — Telehealth: Payer: Self-pay | Admitting: Adult Health

## 2020-02-13 ENCOUNTER — Ambulatory Visit (INDEPENDENT_AMBULATORY_CARE_PROVIDER_SITE_OTHER): Payer: Medicare Other | Admitting: Adult Health

## 2020-02-13 ENCOUNTER — Other Ambulatory Visit: Payer: Self-pay

## 2020-02-13 VITALS — BP 102/70 | HR 68 | Wt 176.0 lb

## 2020-02-13 DIAGNOSIS — J441 Chronic obstructive pulmonary disease with (acute) exacerbation: Secondary | ICD-10-CM | POA: Insufficient documentation

## 2020-02-13 DIAGNOSIS — I2510 Atherosclerotic heart disease of native coronary artery without angina pectoris: Secondary | ICD-10-CM | POA: Diagnosis not present

## 2020-02-13 DIAGNOSIS — R9389 Abnormal findings on diagnostic imaging of other specified body structures: Secondary | ICD-10-CM | POA: Diagnosis not present

## 2020-02-13 DIAGNOSIS — R05 Cough: Secondary | ICD-10-CM

## 2020-02-13 DIAGNOSIS — H7293 Unspecified perforation of tympanic membrane, bilateral: Secondary | ICD-10-CM | POA: Diagnosis not present

## 2020-02-13 DIAGNOSIS — J90 Pleural effusion, not elsewhere classified: Secondary | ICD-10-CM | POA: Diagnosis not present

## 2020-02-13 DIAGNOSIS — H6123 Impacted cerumen, bilateral: Secondary | ICD-10-CM | POA: Diagnosis not present

## 2020-02-13 DIAGNOSIS — J45901 Unspecified asthma with (acute) exacerbation: Secondary | ICD-10-CM | POA: Insufficient documentation

## 2020-02-13 DIAGNOSIS — R059 Cough, unspecified: Secondary | ICD-10-CM

## 2020-02-13 DIAGNOSIS — J849 Interstitial pulmonary disease, unspecified: Secondary | ICD-10-CM

## 2020-02-13 MED ORDER — PREDNISONE 20 MG PO TABS: 20.0000 mg | ORAL_TABLET | Freq: Every day | ORAL | 0 refills | Status: DC

## 2020-02-13 MED ORDER — AMOXICILLIN-POT CLAVULANATE 875-125 MG PO TABS: 1.0000 | ORAL_TABLET | Freq: Two times a day (BID) | ORAL | 0 refills | Status: AC

## 2020-02-13 MED ORDER — AMOXICILLIN-POT CLAVULANATE 875-125 MG PO TABS: 1.0000 | ORAL_TABLET | Freq: Two times a day (BID) | ORAL | 0 refills | Status: DC

## 2020-02-13 MED ORDER — YUPELRI 175 MCG/3ML IN SOLN: 175.0000 ug | Freq: Every day | RESPIRATORY_TRACT | 12 refills | Status: DC

## 2020-02-13 NOTE — Telephone Encounter (Signed)
Called and spoke with [facility] on [address] and they state that the system at the [employer] location is down and they were requesting for prescriptions to be sent over to them. RX has been sent over to them. Called patient and left message letting him know that RX has been sent to pharmacy on [address] and to let us know if he needed anything else or if any of the prescription were to expensive.   In Tammy's note it says:Yupelri is not covered by his insurance can consider ipratropium (Atrovent neb) nebulizer 3 times daily.   Looks like Maretta Bees may not be covered by insurance. Not sure if Atrovent is but we have tons of samples on Yupelri if patient needs them.  Nothing further needed at this time

## 2020-02-13 NOTE — Patient Instructions (Addendum)
Augmentin 875mg  Twice daily  For 7 days -take with food.  Mucinex Twice daily As needed  Cough /congestion  Add Flutter valve Twice daily   Add [employer] daily (if not covered can switch to Atrovent neb)  Add Delsym 2 tsp Twice daily  For severe cough .  CT as planned  Prednisone 20mg  daily  For 4 days .  Follow up with [name] as planned in October with PFT Please contact office for sooner follow up if symptoms do not improve or worsen or seek emergency care

## 2020-02-13 NOTE — Progress Notes (Signed)
@Patient  ID: [name], male    DOB: Nov 19, 1950, 69 y.o.   MRN: [id number]  Chief Complaint  Patient presents with  . Follow-up    Bronchitis     Referring provider: Jonathon Jordan, MD  HPI: 69 year old male former smoker followed for interstitial lung disease, pulmonary emphysema  TEST/EVENTS :   02/13/2020 Follow up : ILD , Emphysema  Patient presents for a follow-up visit.  Patient has recently been seen by his primary care provider and treated for an acute bronchitis.  Patient continues to have ongoing cough and congestion.  He was treated with a Z-Pak which he says made no change in his symptoms.  He denies any fever, hemoptysis, chest pain, orthopnea.  Patient was tested for COVID-19 and reports this was negative.  He is also up-to-date on his Covid vaccine.  And did have COVID-19 in 2020.  He denies any fever, hemoptysis loss of taste or smell. Patient says he has some nasal congestion postnasal drainage and productive cough with thick mucus.  This cough can be quite severe at times.  Feels like he has some intermittent wheezing as well. Was previously on Spiriva which he felt helped however is unable to afford as he has a very high co-pay.  Does question if nebulizer would be more affordable.  Allergies  Allergen Reactions  . Bupropion Other (See Comments)    Caused altered mental status Altered mental status Caused altered mental status  . Ciprofloxacin-Dexamethasone Other (See Comments)    Burning in ears when Cipro ear drops were used unknown Burning in ears when Cipro ear drops were used  . Gabapentin Other (See Comments)    Caused weakness, dizziness, and forgetfulness  Weakness and dizziness with higher doses. Weakness and dizziness Caused weakness, dizziness, and forgetfulness  . Ciprofloxacin Hcl Other (See Comments)    Burning in ears when drops were used    Immunization History  Administered Date(s) Administered  . Influenza Split 04/05/2015,  03/30/2019  . Influenza, High Dose Seasonal PF 03/30/2016  . PFIZER SARS-COV-2 Vaccination 08/12/2019, 09/05/2019  . Pneumococcal Polysaccharide-23 10/24/2007    Past Medical History:  Diagnosis Date  . Arthritis   . Bronchitis    history of  . 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 GERD (gastroesophageal reflux disease)   . Hiatal hernia   . 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 History of echocardiogram    Echo 11/2019: EF 65-70, no R WMA, mild concentric LVH, normal RV SF, normal PASP (RVSP 19.2 mmHg), trivial MR  . Hypercalcemia   . Lipoma    left forearm (3)  . Mitral valve prolapse    does not see cardiologist for. last stress test 2003  . Pneumonia 2009  . Tumor cells, benign    lung, left side  . Unstable angina ([place]) 10/28/2017    Tobacco History: Social History   Tobacco Use  Smoking Status Former Smoker  . Packs/day: 1.50  . Years: 24.00  . Pack years: 36.00  . Types: Cigarettes  . Quit date: 07/06/1987  . Years since quitting: 32.6  Smokeless Tobacco Never Used   Counseling given: Not Answered   Outpatient Medications Prior to Visit  Medication Sig Dispense Refill  . aspirin 81 MG chewable tablet Chew 81 mg by mouth daily.    . famotidine (PEPCID) 20 MG tablet Take 1 tablet (20 mg total) by mouth at bedtime. 30 tablet 3  . FLUoxetine (PROZAC) 40 MG capsule Take  40 mg by mouth every morning.     . methylphenidate (RITALIN) 10 MG tablet Take 10 mg by mouth 3 (three) times daily.      . metoprolol succinate (TOPROL XL) 25 MG 24 hr tablet Take 1 tablet (25 mg total) by mouth daily. 90 tablet 3  . mirtazapine (REMERON) 15 MG tablet Take 15 mg by mouth at bedtime.    . Multiple Vitamin (MULTIVITAMIN) tablet Take 1 tablet by mouth daily.    . nitroGLYCERIN (NITROSTAT) 0.4 MG SL tablet Place 0.4 mg under the tongue every 5 (five) minutes as needed for chest pain.     Marland Kitchen omeprazole (PRILOSEC) 20 MG capsule Take 20 mg by mouth 2 (two) times daily before a meal.      . polyethylene glycol (MIRALAX / GLYCOLAX) packet Take 17 g by mouth daily as needed (for constipation.).     Marland Kitchen rosuvastatin (CRESTOR) 10 MG tablet TAKE 1 TABLET(10 MG) BY MOUTH DAILY 90 tablet 3  . SYNTHROID 25 MCG tablet Take 25 mcg by mouth daily before breakfast.     . tamsulosin (FLOMAX) 0.4 MG CAPS capsule Take 0.4 mg by mouth daily after breakfast.     . Tiotropium Bromide Monohydrate (SPIRIVA RESPIMAT) 2.5 MCG/ACT AERS Inhale 2 puffs into the lungs daily. 4 g 11   No facility-administered medications prior to visit.     Review of Systems:   Constitutional:   No  weight loss, night sweats,  Fevers, chills, fatigue, or  lassitude.  HEENT:   No headaches,  Difficulty swallowing,  Tooth/dental problems, or  Sore throat,                No sneezing, itching, ear ache,  +nasal congestion, post nasal drip,   CV:  No chest pain,  Orthopnea, PND, swelling in lower extremities, anasarca, dizziness, palpitations, syncope.   GI  No heartburn, indigestion, abdominal pain, nausea, vomiting, diarrhea, change in bowel habits, loss of appetite, bloody stools.   Resp:    No chest wall deformity  Skin: no rash or lesions.  GU: no dysuria, change in color of urine, no urgency or frequency.  No flank pain, no hematuria   MS:  No joint pain or swelling.  No decreased range of motion.  No back pain.    Physical Exam  BP 102/70 (BP Location: Left Arm, Cuff Size: Normal)   Pulse 68   Wt 176 lb (79.8 kg)   SpO2 98%   BMI 24.55 kg/m   GEN: A/Ox3; pleasant , NAD, well nourished    HEENT:  [place]/AT,  NOSE-clear, THROAT-clear, no lesions, no postnasal drip or exudate noted.   NECK:  Supple w/ fair ROM; no JVD; normal carotid impulses w/o bruits; no thyromegaly or nodules palpated; no lymphadenopathy.    RESP few trace bibasilar crackles no accessory muscle use, no dullness to percussion  CARD:  RRR, no m/r/g, no peripheral edema, pulses intact, no cyanosis or clubbing.  GI:   Soft & nt;  nml bowel sounds; no organomegaly or masses detected.   Musco: Warm bil, no deformities or joint swelling noted.   Neuro: alert, no focal deficits noted.    Skin: Warm, no lesions or rashes    Lab Results:  CBC    Component Value Date/Time   WBC 5.3 08/17/2019 1517   WBC 8.2 07/17/2019 1421   RBC 5.05 08/17/2019 1517   HGB 15.4 08/17/2019 1517   HCT 46.4 08/17/2019 1517   PLT 112 (L) 08/17/2019  1517   MCV 91.9 08/17/2019 1517   MCH 30.5 08/17/2019 1517   MCHC 33.2 08/17/2019 1517   RDW 11.9 08/17/2019 1517   LYMPHSABS 1.4 08/17/2019 1517   MONOABS 0.4 08/17/2019 1517   EOSABS 0.2 08/17/2019 1517   BASOSABS 0.0 08/17/2019 1517    BMET    Component Value Date/Time   NA 143 08/17/2019 1517   NA 142 02/13/2018 0851   K 3.9 08/17/2019 1517   CL 106 08/17/2019 1517   CO2 27 08/17/2019 1517   GLUCOSE 103 (H) 08/17/2019 1517   BUN 13 08/17/2019 1517   BUN 16 02/13/2018 0851   CREATININE 1.01 08/17/2019 1517   CALCIUM 8.8 (L) 08/17/2019 1517   CALCIUM 9.0 02/19/2011 1233   GFRNONAA >60 08/17/2019 1517   GFRAA >60 08/17/2019 1517    BNP    Component Value Date/Time   BNP 42.2 06/19/2019 0902    ProBNP    Component Value Date/Time   PROBNP 71 10/24/2019 1124    Imaging: DG Chest 2 View  Result Date: 02/13/2020 CLINICAL DATA:  Cough. EXAM: CHEST - 2 VIEW COMPARISON:  06/19/2019 FINDINGS: The lungs are clear without focal pneumonia, edema, pneumothorax or pleural effusion. Interstitial markings are diffusely coarsened with chronic features. The cardiopericardial silhouette is within normal limits for size. The visualized bony structures of the thorax show now acute abnormality. IMPRESSION: No active cardiopulmonary disease. Electronically Signed   By: Misty Stanley M.D.   On: 02/13/2020 11:36      PFT Results Latest Ref Rng & Units 09/25/2019  FVC-Pre L 3.15  FVC-Predicted Pre % 65  Pre FEV1/FVC % % 83  FEV1-Pre L 2.63  FEV1-Predicted Pre % 73  DLCO  uncorrected ml/min/mmHg 21.79  DLCO UNC% % 78  DLCO corrected ml/min/mmHg 21.32  DLCO COR %Predicted % 76  DLVA Predicted % 112    Lab Results  Component Value Date   NITRICOXIDE 14 05/26/2016        Assessment & Plan:   COPD with acute exacerbation (HCC) Slow to resolve flare.  Patient has underlying interstitial lung disease and emphysema.  Previous CT chest showed some bronchiectasis and interstitial changes. Chest x-ray today shows no acute pneumonia.  Will treat with broad-spectrum antibiotic with Augmentin increase pulmonary hygiene add in flutter valve.  Add in nebulized LAMA as he had perceived clinical benefit with Spiriva.  If Maretta Bees is not covered by his insurance can consider ipratropium nebulizer 3 times daily  Plan  Patient Instructions  Augmentin 875mg  Twice daily  For 7 days -take with food.  Mucinex Twice daily As needed  Cough /congestion  Add Flutter valve Twice daily   Add [employer] daily (if not covered can switch to Atrovent neb)  Add Delsym 2 tsp Twice daily  For severe cough .  CT as planned  Prednisone 20mg  daily  For 4 days .  Follow up with [name] as planned in October.  Please contact office for sooner follow up if symptoms do not improve or worsen or seek emergency care        Abnormal CT of the chest Interstitial lung disease changes on CT chest.  Patient has follow-up CT next month with PFTs.     Rexene Edison, NP 02/13/2020

## 2020-02-13 NOTE — Assessment & Plan Note (Signed)
Interstitial lung disease changes on CT chest.  Patient has follow-up CT next month with PFTs.

## 2020-02-13 NOTE — Assessment & Plan Note (Signed)
Slow to resolve flare.  Patient has underlying interstitial lung disease and emphysema.  Previous CT chest showed some bronchiectasis and interstitial changes. Chest x-ray today shows no acute pneumonia.  Will treat with broad-spectrum antibiotic with Augmentin increase pulmonary hygiene add in flutter valve.  Add in nebulized LAMA as he had perceived clinical benefit with Spiriva.  If Maretta Bees is not covered by his insurance can consider ipratropium nebulizer 3 times daily  Plan  Patient Instructions  Augmentin 875mg  Twice daily  For 7 days -take with food.  Mucinex Twice daily As needed  Cough /congestion  Add Flutter valve Twice daily   Add [employer] daily (if not covered can switch to Atrovent neb)  Add Delsym 2 tsp Twice daily  For severe cough .  CT as planned  Prednisone 20mg  daily  For 4 days .  Follow up with [name] as planned in October.  Please contact office for sooner follow up if symptoms do not improve or worsen or seek emergency care

## 2020-02-13 NOTE — Progress Notes (Signed)
Pulmonary Individual Treatment Plan  Patient Details  Name: DEVAUNTE GASPARINI MRN: [id number] Date of Birth: [birth year] Referring Provider:     Pulmonary Rehab Walk Test from 12/12/2019 in [facility]  Referring Provider Brand Males, MD      Initial Encounter Date:    Pulmonary Rehab Walk Test from 12/12/2019 in [place]  Date 12/12/19      Visit Diagnosis: Interstitial lung disease ([place])  Patient's Home Medications on Admission:   Current Outpatient Medications:  .  amoxicillin-clavulanate (AUGMENTIN) 875-125 MG tablet, Take 1 tablet by mouth 2 (two) times daily for 7 days., Disp: 14 tablet, Rfl: 0 .  aspirin 81 MG chewable tablet, Chew 81 mg by mouth daily., Disp: , Rfl:  .  famotidine (PEPCID) 20 MG tablet, Take 1 tablet (20 mg total) by mouth at bedtime., Disp: 30 tablet, Rfl: 3 .  FLUoxetine (PROZAC) 40 MG capsule, Take 40 mg by mouth every morning. , Disp: , Rfl:  .  methylphenidate (RITALIN) 10 MG tablet, Take 10 mg by mouth 3 (three) times daily.  , Disp: , Rfl:  .  metoprolol succinate (TOPROL XL) 25 MG 24 hr tablet, Take 1 tablet (25 mg total) by mouth daily., Disp: 90 tablet, Rfl: 3 .  mirtazapine (REMERON) 15 MG tablet, Take 15 mg by mouth at bedtime., Disp: , Rfl:  .  Multiple Vitamin (MULTIVITAMIN) tablet, Take 1 tablet by mouth daily., Disp: , Rfl:  .  nitroGLYCERIN (NITROSTAT) 0.4 MG SL tablet, Place 0.4 mg under the tongue every 5 (five) minutes as needed for chest pain. , Disp: , Rfl:  .  omeprazole (PRILOSEC) 20 MG capsule, Take 20 mg by mouth 2 (two) times daily before a meal. , Disp: , Rfl:  .  polyethylene glycol (MIRALAX / GLYCOLAX) packet, Take 17 g by mouth daily as needed (for constipation.). , Disp: , Rfl:  .  predniSONE (DELTASONE) 20 MG tablet, Take 1 tablet (20 mg total) by mouth daily with breakfast., Disp: 4 tablet, Rfl: 0 .  revefenacin (YUPELRI) 175 MCG/3ML nebulizer solution, Take 3  mLs (175 mcg total) by nebulization daily., Disp: 90 mL, Rfl: 12 .  rosuvastatin (CRESTOR) 10 MG tablet, TAKE 1 TABLET(10 MG) BY MOUTH DAILY, Disp: 90 tablet, Rfl: 3 .  SYNTHROID 25 MCG tablet, Take 25 mcg by mouth daily before breakfast. , Disp: , Rfl:  .  tamsulosin (FLOMAX) 0.4 MG CAPS capsule, Take 0.4 mg by mouth daily after breakfast. , Disp: , Rfl:   Past Medical History: Past Medical History:  Diagnosis Date  . Arthritis   . Bronchitis    history of  . 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 GERD (gastroesophageal reflux disease)   . Hiatal hernia   . 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 History of echocardiogram    Echo 11/2019: EF 65-70, no R WMA, mild concentric LVH, normal RV SF, normal PASP (RVSP 19.2 mmHg), trivial MR  . Hypercalcemia   . Lipoma    left forearm (3)  . Mitral valve prolapse    does not see cardiologist for. last stress test 2003  . Pneumonia 2009  . Tumor cells, benign    lung, left side  . Unstable angina ([place]) 10/28/2017    Tobacco Use: Social History   Tobacco Use  Smoking Status Former Smoker  . Packs/day: 1.50  . Years: 24.00  . Pack years: 36.00  . Types: Cigarettes  .  Quit date: 07/06/1987  . Years since quitting: 32.6  Smokeless Tobacco Never Used    Labs: Recent Chemical engineer    Labs for ITP Cardiac and Pulmonary Rehab Latest Ref Rng & Units 10/21/2015 10/28/2017 10/29/2017 02/13/2018 12/29/2018   Cholestrol 100 - 199 mg/dL 189 - 128 134 -   LDLCALC 0 - 99 mg/dL 124(H) - 76 66 -   HDL >39 mg/dL 43 - 38(L) 41 -   Trlycerides 0 - 149 mg/dL 110 - 69 137 -   Hemoglobin A1c 4.8 - 5.6 % 5.9(H) 5.4 - - -   TCO2 22 - 32 mmol/L - - - - 30      Capillary Blood Glucose: Lab Results  Component Value Date   GLUCAP 152 (H) 12/29/2018   GLUCAP 136 (H) 03/15/2016   GLUCAP 102 (H) 09/05/2015   GLUCAP 84 09/05/2015   GLUCAP 121 (H) 09/04/2015     Pulmonary Assessment Scores:  UCSD: Self-administered rating of dyspnea associated  with activities of daily living (ADLs) 6-point scale (0 = "not at all" to 5 = "maximal or unable to do because of breathlessness")  Scoring Scores range from 0 to 120.  Minimally important difference is 5 units  CAT: CAT can identify the health impairment of COPD patients and is better correlated with disease progression.  CAT has a scoring range of zero to 40. The CAT score is classified into four groups of low (less than 10), medium (10 - 20), high (21-30) and very high (31-40) based on the impact level of disease on health status. A CAT score over 10 suggests significant symptoms.  A worsening CAT score could be explained by an exacerbation, poor medication adherence, poor inhaler technique, or progression of COPD or comorbid conditions.  CAT MCID is 2 points  mMRC: mMRC (Modified Medical Research Council) Dyspnea Scale is used to assess the degree of baseline functional disability in patients of respiratory disease due to dyspnea. No minimal important difference is established. A decrease in score of 1 point or greater is considered a positive change.   Pulmonary Function Assessment:   Exercise Target Goals: Exercise Program Goal: Individual exercise prescription set using results from initial 6 min walk test and THRR while considering  patient's activity barriers and safety.   Exercise Prescription Goal: Initial exercise prescription builds to 30-45 minutes a day of aerobic activity, 2-3 days per week.  Home exercise guidelines will be given to patient during program as part of exercise prescription that the participant will acknowledge.  Activity Barriers & Risk Stratification:   6 Minute Walk:   Oxygen Initial Assessment:   Oxygen Re-Evaluation:  Oxygen Re-Evaluation    Row Name 12/18/19 1442 01/15/20 0717 02/12/20 1149         Program Oxygen Prescription   Program Oxygen Prescription None None None       Home Oxygen   Home Oxygen Device None None None     Sleep  Oxygen Prescription None None None     Home Exercise Oxygen Prescription None None None     Home at Rest Exercise Oxygen Prescription None None None     Compliance with Home Oxygen Use Yes Yes Yes       Goals/Expected Outcomes   Short Term Goals To learn and exhibit compliance with exercise, home and travel O2 prescription;To learn and understand importance of maintaining oxygen saturations>88%;To learn and demonstrate proper use of respiratory medications;To learn and understand importance of monitoring SPO2 with pulse oximeter  and demonstrate accurate use of the pulse oximeter.;To learn and demonstrate proper pursed lip breathing techniques or other breathing techniques.;Other To learn and exhibit compliance with exercise, home and travel O2 prescription;To learn and understand importance of maintaining oxygen saturations>88%;To learn and demonstrate proper use of respiratory medications;To learn and understand importance of monitoring SPO2 with pulse oximeter and demonstrate accurate use of the pulse oximeter.;To learn and demonstrate proper pursed lip breathing techniques or other breathing techniques.;Other To learn and exhibit compliance with exercise, home and travel O2 prescription;To learn and understand importance of maintaining oxygen saturations>88%;To learn and demonstrate proper use of respiratory medications;To learn and understand importance of monitoring SPO2 with pulse oximeter and demonstrate accurate use of the pulse oximeter.;To learn and demonstrate proper pursed lip breathing techniques or other breathing techniques.;Other     Long  Term Goals Exhibits compliance with exercise, home and travel O2 prescription;Verbalizes importance of monitoring SPO2 with pulse oximeter and return demonstration;Maintenance of O2 saturations>88%;Exhibits proper breathing techniques, such as pursed lip breathing or other method taught during program session;Compliance with respiratory medication;Other  Exhibits compliance with exercise, home and travel O2 prescription;Verbalizes importance of monitoring SPO2 with pulse oximeter and return demonstration;Maintenance of O2 saturations>88%;Exhibits proper breathing techniques, such as pursed lip breathing or other method taught during program session;Compliance with respiratory medication;Other Exhibits compliance with exercise, home and travel O2 prescription;Verbalizes importance of monitoring SPO2 with pulse oximeter and return demonstration;Maintenance of O2 saturations>88%;Exhibits proper breathing techniques, such as pursed lip breathing or other method taught during program session;Compliance with respiratory medication;Other     Goals/Expected Outcomes compliance compliance compliance            Oxygen Discharge (Final Oxygen Re-Evaluation):  Oxygen Re-Evaluation - 02/12/20 1149      Program Oxygen Prescription   Program Oxygen Prescription None      Home Oxygen   Home Oxygen Device None    Sleep Oxygen Prescription None    Home Exercise Oxygen Prescription None    Home at Rest Exercise Oxygen Prescription None    Compliance with Home Oxygen Use Yes      Goals/Expected Outcomes   Short Term Goals To learn and exhibit compliance with exercise, home and travel O2 prescription;To learn and understand importance of maintaining oxygen saturations>88%;To learn and demonstrate proper use of respiratory medications;To learn and understand importance of monitoring SPO2 with pulse oximeter and demonstrate accurate use of the pulse oximeter.;To learn and demonstrate proper pursed lip breathing techniques or other breathing techniques.;Other    Long  Term Goals Exhibits compliance with exercise, home and travel O2 prescription;Verbalizes importance of monitoring SPO2 with pulse oximeter and return demonstration;Maintenance of O2 saturations>88%;Exhibits proper breathing techniques, such as pursed lip breathing or other method taught during  program session;Compliance with respiratory medication;Other    Goals/Expected Outcomes compliance           Initial Exercise Prescription:   Perform Capillary Blood Glucose checks as needed.  Exercise Prescription Changes:   Exercise Prescription Changes    Row Name 12/18/19 1400 01/01/20 1100 01/08/20 1148 01/24/20 1141       Response to Exercise   Blood Pressure (Admit) 104/60 100/56 106/60 104/60    Blood Pressure (Exercise) 136/74 110/60 -- --    Blood Pressure (Exit) 116/78 116/78 134/76 110/62    Heart Rate (Admit) 77 bpm 92 bpm 76 bpm 86 bpm    Heart Rate (Exercise) 103 bpm 96 bpm 111 bpm 114 bpm    Heart Rate (Exit) 95 bpm 90  bpm 88 bpm 95 bpm    Oxygen Saturation (Admit) 98 % 98 % 99 % 100 %    Oxygen Saturation (Exercise) 96 % 96 % 97 % 97 %    Oxygen Saturation (Exit) 97 % 98 % 98 % 98 %    Rating of Perceived Exertion (Exercise) 14 11.5 12.5 13    Perceived Dyspnea (Exercise) '2 1 2 1    ' Duration Continue with 30 min of aerobic exercise without signs/symptoms of physical distress. Continue with 30 min of aerobic exercise without signs/symptoms of physical distress. Continue with 30 min of aerobic exercise without signs/symptoms of physical distress. Continue with 30 min of aerobic exercise without signs/symptoms of physical distress.    Intensity THRR unchanged THRR unchanged THRR unchanged THRR unchanged      Progression   Progression Continue to progress workloads to maintain intensity without signs/symptoms of physical distress. Continue to progress workloads to maintain intensity without signs/symptoms of physical distress. Continue to progress workloads to maintain intensity without signs/symptoms of physical distress. Continue to progress workloads to maintain intensity without signs/symptoms of physical distress.      Resistance Training   Training Prescription Yes Yes Yes Yes    Weight blue bands blue band blue bands blue bands    Reps 10-15 10-15 10-15  10-15    Time 10 Minutes 10 Minutes 10 Minutes 10 Minutes      Recumbant Bike   Level 1.5 2.'5 3 3    ' Minutes '15 15 15 15    ' METs 3.6 4.5 6.1 6.1      NuStep   Level '3 3 4 4    ' SPM 80 80 80 80    Minutes '15 15 15 15    ' METs 2.2 2.5 3.1 3.3      Home Exercise Plan   Plans to continue exercise at -- Home (comment) -- --    Frequency -- Add 3 additional days to program exercise sessions. -- --    Initial Home Exercises Provided -- 01/01/20 -- --           Exercise Comments:   Exercise Comments    Row Name 12/18/19 1429 01/01/20 1159 02/13/20 1542       Exercise Comments Pt completed first day of exercise today with no complaints. He tolerated exercise well. Home exercise complete. Pt is presently on medical hold and has been unable to attend pulmonary rehab since 01/24/20.  Pt seen by Pulmonary and will be treated with antibiotic, steriod and nebulizer.  Hopeful pt can return soon.            Exercise Goals and Review:   Exercise Goals    Row Name 12/18/19 1446 01/15/20 0718 02/12/20 1149         Exercise Goals   Increase Physical Activity Yes Yes Yes     Intervention Provide advice, education, support and counseling about physical activity/exercise needs.;Develop an individualized exercise prescription for aerobic and resistive training based on initial evaluation findings, risk stratification, comorbidities and participant's personal goals. Provide advice, education, support and counseling about physical activity/exercise needs.;Develop an individualized exercise prescription for aerobic and resistive training based on initial evaluation findings, risk stratification, comorbidities and participant's personal goals. Provide advice, education, support and counseling about physical activity/exercise needs.;Develop an individualized exercise prescription for aerobic and resistive training based on initial evaluation findings, risk stratification, comorbidities and participant's  personal goals.     Expected Outcomes Short Term: Attend rehab on a regular basis  to increase amount of physical activity.;Long Term: Exercising regularly at least 3-5 days a week.;Long Term: Add in home exercise to make exercise part of routine and to increase amount of physical activity. Short Term: Attend rehab on a regular basis to increase amount of physical activity.;Long Term: Exercising regularly at least 3-5 days a week.;Long Term: Add in home exercise to make exercise part of routine and to increase amount of physical activity. Short Term: Attend rehab on a regular basis to increase amount of physical activity.;Long Term: Exercising regularly at least 3-5 days a week.;Long Term: Add in home exercise to make exercise part of routine and to increase amount of physical activity.     Increase Strength and Stamina Yes Yes Yes     Intervention Provide advice, education, support and counseling about physical activity/exercise needs.;Develop an individualized exercise prescription for aerobic and resistive training based on initial evaluation findings, risk stratification, comorbidities and participant's personal goals. Provide advice, education, support and counseling about physical activity/exercise needs.;Develop an individualized exercise prescription for aerobic and resistive training based on initial evaluation findings, risk stratification, comorbidities and participant's personal goals. Provide advice, education, support and counseling about physical activity/exercise needs.;Develop an individualized exercise prescription for aerobic and resistive training based on initial evaluation findings, risk stratification, comorbidities and participant's personal goals.     Expected Outcomes Short Term: Increase workloads from initial exercise prescription for resistance, speed, and METs.;Short Term: Perform resistance training exercises routinely during rehab and add in resistance training at home;Long Term:  Improve cardiorespiratory fitness, muscular endurance and strength as measured by increased METs and functional capacity (6MWT) Short Term: Increase workloads from initial exercise prescription for resistance, speed, and METs.;Short Term: Perform resistance training exercises routinely during rehab and add in resistance training at home;Long Term: Improve cardiorespiratory fitness, muscular endurance and strength as measured by increased METs and functional capacity (6MWT) Short Term: Increase workloads from initial exercise prescription for resistance, speed, and METs.;Short Term: Perform resistance training exercises routinely during rehab and add in resistance training at home;Long Term: Improve cardiorespiratory fitness, muscular endurance and strength as measured by increased METs and functional capacity (6MWT)     Able to understand and use rate of perceived exertion (RPE) scale Yes Yes Yes     Intervention Provide education and explanation on how to use RPE scale Provide education and explanation on how to use RPE scale Provide education and explanation on how to use RPE scale     Expected Outcomes Short Term: Able to use RPE daily in rehab to express subjective intensity level;Long Term:  Able to use RPE to guide intensity level when exercising independently Short Term: Able to use RPE daily in rehab to express subjective intensity level;Long Term:  Able to use RPE to guide intensity level when exercising independently Short Term: Able to use RPE daily in rehab to express subjective intensity level;Long Term:  Able to use RPE to guide intensity level when exercising independently     Able to understand and use Dyspnea scale Yes Yes Yes     Intervention Provide education and explanation on how to use Dyspnea scale Provide education and explanation on how to use Dyspnea scale Provide education and explanation on how to use Dyspnea scale     Expected Outcomes Short Term: Able to use Dyspnea scale daily in  rehab to express subjective sense of shortness of breath during exertion;Long Term: Able to use Dyspnea scale to guide intensity level when exercising independently Short Term: Able to use  Dyspnea scale daily in rehab to express subjective sense of shortness of breath during exertion;Long Term: Able to use Dyspnea scale to guide intensity level when exercising independently Short Term: Able to use Dyspnea scale daily in rehab to express subjective sense of shortness of breath during exertion;Long Term: Able to use Dyspnea scale to guide intensity level when exercising independently     Knowledge and understanding of Target Heart Rate Range (THRR) Yes Yes Yes     Intervention Provide education and explanation of THRR including how the numbers were predicted and where they are located for reference Provide education and explanation of THRR including how the numbers were predicted and where they are located for reference Provide education and explanation of THRR including how the numbers were predicted and where they are located for reference     Expected Outcomes Short Term: Able to state/look up THRR;Long Term: Able to use THRR to govern intensity when exercising independently;Short Term: Able to use daily as guideline for intensity in rehab Short Term: Able to state/look up THRR;Long Term: Able to use THRR to govern intensity when exercising independently;Short Term: Able to use daily as guideline for intensity in rehab Short Term: Able to state/look up THRR;Long Term: Able to use THRR to govern intensity when exercising independently;Short Term: Able to use daily as guideline for intensity in rehab     Understanding of Exercise Prescription Yes Yes Yes     Intervention Provide education, explanation, and written materials on patient's individual exercise prescription Provide education, explanation, and written materials on patient's individual exercise prescription Provide education, explanation, and written  materials on patient's individual exercise prescription     Expected Outcomes Short Term: Able to explain program exercise prescription;Long Term: Able to explain home exercise prescription to exercise independently Short Term: Able to explain program exercise prescription;Long Term: Able to explain home exercise prescription to exercise independently Short Term: Able to explain program exercise prescription;Long Term: Able to explain home exercise prescription to exercise independently            Exercise Goals Re-Evaluation :  Exercise Goals Re-Evaluation    Row Name 12/18/19 1444 01/15/20 0718 02/12/20 1149         Exercise Goal Re-Evaluation   Exercise Goals Review Increase Physical Activity;Increase Strength and Stamina;Able to understand and use rate of perceived exertion (RPE) scale;Able to understand and use Dyspnea scale;Knowledge and understanding of Target Heart Rate Range (THRR);Understanding of Exercise Prescription Increase Physical Activity;Increase Strength and Stamina;Able to understand and use rate of perceived exertion (RPE) scale;Able to understand and use Dyspnea scale;Knowledge and understanding of Target Heart Rate Range (THRR);Understanding of Exercise Prescription Increase Physical Activity;Increase Strength and Stamina;Able to understand and use rate of perceived exertion (RPE) scale;Able to understand and use Dyspnea scale;Knowledge and understanding of Target Heart Rate Range (THRR);Understanding of Exercise Prescription     Comments Pt has completed 1 exercise session. He has a positive attitude and is eager to exercise. He exercised at 3.6 METs on the recumbent bike. Will monitor and progress as able. Pt has completed 7 exercise sessions. He is eager for workload increases. He is exercising at 6.1 METs on th recumbent bike. Will continue to monitor and progress as able. Pt has completed 8 exercise sessions. He was on vacation for two weeks, returned for one visit, and has  missed the past several weeks due to being sick. He is set to graduate 8/12, so he will likely be discharged from the program.  Expected Outcomes Through exercise at rehab and at home, the patient will decrease shortness of breath with daily activities and feel confident in carrying out an exercise regime at home. Through exercise at rehab and at home, the patient will decrease shortness of breath with daily activities and feel confident in carrying out an exercise regime at home. Through exercise at rehab and at home, the patient will decrease shortness of breath with daily activities and feel confident in carrying out an exercise regime at home.            Discharge Exercise Prescription (Final Exercise Prescription Changes):  Exercise Prescription Changes - 01/24/20 1141      Response to Exercise   Blood Pressure (Admit) 104/60    Blood Pressure (Exit) 110/62    Heart Rate (Admit) 86 bpm    Heart Rate (Exercise) 114 bpm    Heart Rate (Exit) 95 bpm    Oxygen Saturation (Admit) 100 %    Oxygen Saturation (Exercise) 97 %    Oxygen Saturation (Exit) 98 %    Rating of Perceived Exertion (Exercise) 13    Perceived Dyspnea (Exercise) 1    Duration Continue with 30 min of aerobic exercise without signs/symptoms of physical distress.    Intensity THRR unchanged      Progression   Progression Continue to progress workloads to maintain intensity without signs/symptoms of physical distress.      Resistance Training   Training Prescription Yes    Weight blue bands    Reps 10-15    Time 10 Minutes      Recumbant Bike   Level 3    Minutes 15    METs 6.1      NuStep   Level 4    SPM 80    Minutes 15    METs 3.3           Nutrition:  Target Goals: Understanding of nutrition guidelines, daily intake of sodium <1558m, cholesterol <2052m calories 30% from fat and 7% or less from saturated fats, daily to have 5 or more servings of fruits and  vegetables.  Biometrics:    Nutrition Therapy Plan and Nutrition Goals:  Nutrition Therapy & Goals - 12/20/19 1323      Nutrition Therapy   Diet Generally health/carb modified      Personal Nutrition Goals   Nutrition Goal Pt to maintain current healthy diet    Personal Goal #2 Pt to reduce afternoon ice cream portions      Intervention Plan   Intervention Nutrition handout(s) given to patient.;Prescribe, educate and counsel regarding individualized specific dietary modifications aiming towards targeted core components such as weight, hypertension, lipid management, diabetes, heart failure and other comorbidities.    Expected Outcomes Short Term Goal: A plan has been developed with personal nutrition goals set during dietitian appointment.;Long Term Goal: Adherence to prescribed nutrition plan.           Nutrition Assessments:  Nutrition Assessments - 12/20/19 1324      Rate Your Plate Scores   Pre Score 61           Nutrition Goals Re-Evaluation:  Nutrition Goals Re-Evaluation    [place] 12/20/19 1324 01/15/20 1416           Goals   Current Weight 173 lb (78.5 kg) 176 lb 9.4 oz (80.1 kg)      Nutrition Goal Pt to maintain current healthy diet Pt to maintain current healthy diet  Personal Goal #2 Re-Evaluation   Personal Goal #2 Pt to reduce afternoon ice cream portions Pt to reduce afternoon ice cream portions             Nutrition Goals Discharge (Final Nutrition Goals Re-Evaluation):  Nutrition Goals Re-Evaluation - 01/15/20 1416      Goals   Current Weight 176 lb 9.4 oz (80.1 kg)    Nutrition Goal Pt to maintain current healthy diet      Personal Goal #2 Re-Evaluation   Personal Goal #2 Pt to reduce afternoon ice cream portions           Psychosocial: Target Goals: Acknowledge presence or absence of significant depression and/or stress, maximize coping skills, provide positive support system. Participant is able to verbalize types and  ability to use techniques and skills needed for reducing stress and depression.  Initial Review & Psychosocial Screening:   Quality of Life Scores:  Scores of 19 and below usually indicate a poorer quality of life in these areas.  A difference of  2-3 points is a clinically meaningful difference.  A difference of 2-3 points in the total score of the Quality of Life Index has been associated with significant improvement in overall quality of life, self-image, physical symptoms, and general health in studies assessing change in quality of life.  PHQ-9: Recent Review Flowsheet Data    Depression screen [facility] 2/9 12/12/2019   Decreased Interest 0   Down, Depressed, Hopeless 0   PHQ - 2 Score 0   Altered sleeping 3   Tired, decreased energy 3   Change in appetite 1   Feeling bad or failure about yourself  0   Trouble concentrating 0   Moving slowly or fidgety/restless 0   Suicidal thoughts 0   Difficult doing work/chores Not difficult at all     Interpretation of Total Score  Total Score Depression Severity:  1-4 = Minimal depression, 5-9 = Mild depression, 10-14 = Moderate depression, 15-19 = Moderately severe depression, 20-27 = Severe depression   Psychosocial Evaluation and Intervention:   Psychosocial Re-Evaluation:  Psychosocial Re-Evaluation    [place] Name 12/18/19 1538 01/14/20 1350 02/13/20 1543         Psychosocial Re-Evaluation   Current issues with Current Depression;History of Depression None Identified History of Depression     Comments Depression is currently stable, no barriers or concerns identified. No barriers or concerns identified at this time. Pt wit history of depression/anxiety and this is managed with Prozac and Remeron.     Expected Outcomes For patient to be free of barriers or psychosocial concerns while participating in pulmonary rehab. For Mikki Santee to continue to have no psychosocial concerns while participating in pulmonary rehab. For Mikki Santee to continue to have no  psychosocial concerns while participating in pulmonary rehab and continued display positve and healthy coping skills     Interventions Encouraged to attend Pulmonary Rehabilitation for the exercise Encouraged to attend Pulmonary Rehabilitation for the exercise Encouraged to attend Pulmonary Rehabilitation for the exercise;Relaxation education;Stress management education     Continue Psychosocial Services  No Follow up required No Follow up required Follow up required by staff  will continue to monitor and reassess for mental well being.            Psychosocial Discharge (Final Psychosocial Re-Evaluation):  Psychosocial Re-Evaluation - 02/13/20 1543      Psychosocial Re-Evaluation   Current issues with History of Depression    Comments Pt wit history of depression/anxiety and this  is managed with Prozac and Remeron.    Expected Outcomes For Mikki Santee to continue to have no psychosocial concerns while participating in pulmonary rehab and continued display positve and healthy coping skills    Interventions Encouraged to attend Pulmonary Rehabilitation for the exercise;Relaxation education;Stress management education    Continue Psychosocial Services  Follow up required by staff   will continue to monitor and reassess for mental well being.          Education: Education Goals: Education classes will be provided on a weekly basis, covering required topics. Participant will state understanding/return demonstration of topics presented.  Learning Barriers/Preferences:   Education Topics: Risk Factor Reduction:  -Group instruction that is supported by a PowerPoint presentation. Instructor discusses the definition of a risk factor, different risk factors for pulmonary disease, and how the heart and lungs work together.     Nutrition for Pulmonary Patient:  -Group instruction provided by PowerPoint slides, verbal discussion, and written materials to support subject matter. The instructor gives an  explanation and review of healthy diet recommendations, which includes a discussion on weight management, recommendations for fruit and vegetable consumption, as well as protein, fluid, caffeine, fiber, sodium, sugar, and alcohol. Tips for eating when patients are short of breath are discussed.   Pursed Lip Breathing:  -Group instruction that is supported by demonstration and informational handouts. Instructor discusses the benefits of pursed lip and diaphragmatic breathing and detailed demonstration on how to preform both.     PULMONARY REHAB OTHER RESPIRATORY from 01/24/2020 in [place]  Date 12/25/19  Educator Handout  Instruction Review Code --  [N/A]      Oxygen Safety:  -Group instruction provided by PowerPoint, verbal discussion, and written material to support subject matter. There is an overview of "What is Oxygen" and "Why do we need it".  Instructor also reviews how to create a safe environment for oxygen use, the importance of using oxygen as prescribed, and the risks of noncompliance. There is a brief discussion on traveling with oxygen and resources the patient may utilize.   Oxygen Equipment:  -Group instruction provided by [facility] Staff utilizing handouts, written materials, and equipment demonstrations.   Signs and Symptoms:  -Group instruction provided by written material and verbal discussion to support subject matter. Warning signs and symptoms of infection, stroke, and heart attack are reviewed and when to call the physician/911 reinforced. Tips for preventing the spread of infection discussed.   Advanced Directives:  -Group instruction provided by verbal instruction and written material to support subject matter. Instructor reviews Advanced Directive laws and proper instruction for filling out document.   Pulmonary Video:  -Group video education that reviews the importance of medication and oxygen compliance, exercise, good  nutrition, pulmonary hygiene, and pursed lip and diaphragmatic breathing for the pulmonary patient.   Exercise for the Pulmonary Patient:  -Group instruction that is supported by a PowerPoint presentation. Instructor discusses benefits of exercise, core components of exercise, frequency, duration, and intensity of an exercise routine, importance of utilizing pulse oximetry during exercise, safety while exercising, and options of places to exercise outside of rehab.     Pulmonary Medications:  -Verbally interactive group education provided by instructor with focus on inhaled medications and proper administration.   Anatomy and Physiology of the Respiratory System and Intimacy:  -Group instruction provided by PowerPoint, verbal discussion, and written material to support subject matter. Instructor reviews respiratory cycle and anatomical components of the respiratory system and their functions.  Instructor also reviews differences in obstructive and restrictive respiratory diseases with examples of each. Intimacy, Sex, and Sexuality differences are reviewed with a discussion on how relationships can change when diagnosed with pulmonary disease. Common sexual concerns are reviewed.   MD DAY -A group question and answer session with a medical doctor that allows participants to ask questions that relate to their pulmonary disease state.   OTHER EDUCATION -Group or individual verbal, written, or video instructions that support the educational goals of the pulmonary rehab program.   PULMONARY REHAB OTHER RESPIRATORY from 01/24/2020 in [place]  Date 01/24/20  [facility] level ]  Educator handout  Instruction Review Code 1- Verbalizes Understanding      Holiday Eating Survival Tips:  -Group instruction provided by PowerPoint slides, verbal discussion, and written materials to support subject matter. The instructor gives patients tips, tricks, and techniques to help them  not only survive but enjoy the holidays despite the onslaught of food that accompanies the holidays.   Knowledge Questionnaire Score:   Core Components/Risk Factors/Patient Goals at Admission:   Core Components/Risk Factors/Patient Goals Review:   Goals and Risk Factor Review    Row Name 12/18/19 1539 01/14/20 1351           Core Components/Risk Factors/Patient Goals Review   Personal Goals Review Increase knowledge of respiratory medications and ability to use respiratory devices properly.;Improve shortness of breath with ADL's;Develop more efficient breathing techniques such as purse lipped breathing and diaphragmatic breathing and practicing self-pacing with activity. Increase knowledge of respiratory medications and ability to use respiratory devices properly.;Improve shortness of breath with ADL's;Develop more efficient breathing techniques such as purse lipped breathing and diaphragmatic breathing and practicing self-pacing with activity.      Review Mikki Santee just started pulmonary rehab today, too earlt to see progression towards goals. Mikki Santee works hard when he exercises, he is level 4 on the nustep exercising @ 3.1 mets, and is level 3 on the recumbent bike.  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      Expected Outcomes See admission goals. See admission goals             Core Components/Risk Factors/Patient Goals at Discharge (Final Review):   Goals and Risk Factor Review - 01/14/20 1351      Core Components/Risk Factors/Patient Goals Review   Personal Goals Review Increase knowledge of respiratory medications and ability to use respiratory devices properly.;Improve shortness of breath with ADL's;Develop more efficient breathing techniques such as purse lipped breathing and diaphragmatic breathing and practicing self-pacing with activity.    Review Mikki Santee works hard when he exercises, he is level 4 on the nustep exercising @ 3.1 mets, and is level 3 on the  recumbent bike.  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    Expected Outcomes See admission goals           ITP Comments:  ITP Comments    Row Name 02/13/20 1540           ITP Comments Dr. Rodman Pickle, Medical Director [facility]              Comments:  Mikki Santee has completed *8 xercise session in Pulmonary rehab. Pt is on hold medically. Pulmonary rehab staff will  continue to monitor and reassess progress toward goals during her participation in Pulmonary Rehab. Maurice Small RN, BSN Cardiac and Pulmonary  Rehab Nurse Navigator

## 2020-02-14 ENCOUNTER — Encounter (HOSPITAL_COMMUNITY): Admission: RE | Admit: 2020-02-14 | Payer: Medicare Other | Source: Ambulatory Visit

## 2020-02-14 ENCOUNTER — Encounter (HOSPITAL_COMMUNITY): Payer: Self-pay | Admitting: *Deleted

## 2020-02-14 ENCOUNTER — Telehealth: Payer: Self-pay | Admitting: Adult Health

## 2020-02-14 NOTE — Progress Notes (Signed)
Called [name] today to discuss Pulmonary Rehab. He continues to be sick with bronchitis and had two doctor appointment 02/13/20. We felt that it would be in his best interest to discharge him from the program since he has been sick and unable to attend in several weeks now. [name] is in agreement that  this is the best decision. I have encouraged him to take the time he needs to get well and then to ask [name] for a new referral. I also told him that if he needs any help with this or would like [place] to do this to call us. He voices understanding.

## 2020-02-14 NOTE — Telephone Encounter (Signed)
I called [name] & let her know that I sent an order over for the nebulizer yesterday & that Miquel Dunn Confirmed receipt today.  [name] verbalized understanding & stated nothing further needed at this time.

## 2020-02-14 NOTE — Addendum Note (Signed)
Addended by: Desmond Dike C on: 02/14/2020 09:44 AM   Modules accepted: Orders

## 2020-02-19 NOTE — Telephone Encounter (Signed)
Patient is ready to schedule his CT, he had cancelled his previous scan.

## 2020-02-20 NOTE — Telephone Encounter (Signed)
Looks like we may need a new order I do not see a open one to schedule

## 2020-02-20 NOTE — Telephone Encounter (Signed)
Patient states that he is ready to schedule his CT scan. I see in the notes from his last OV on 02/13/2020 it states that he has follow up CT next month with PFTs. Looks like he is supposed to do PFT on 03/11/20 but I don't see anything about CT or an order. Not sure which kind of CT he is supposed to have.   Tammy please advise

## 2020-02-21 NOTE — Telephone Encounter (Signed)
Response from Skeet Latch, Salvadore Dom, [place]; Sandi Raveling, Inis Sizer, [facility]   [place],   I'm sorry but I am on the DME side. I have forward this to the pharmacy department asking them to reach out to the patient about the medication.   Thanks

## 2020-02-21 NOTE — Telephone Encounter (Signed)
I have called and left Melissa a voicemail as well as sent Leah a community message will wait to hear back from one of them

## 2020-02-22 DIAGNOSIS — R1084 Generalized abdominal pain: Secondary | ICD-10-CM | POA: Diagnosis not present

## 2020-02-22 DIAGNOSIS — N200 Calculus of kidney: Secondary | ICD-10-CM | POA: Diagnosis not present

## 2020-02-24 NOTE — Addendum Note (Signed)
Addended by: Brand Males on: 02/24/2020 03:55 PM   Modules accepted: Level of Service

## 2020-02-25 ENCOUNTER — Other Ambulatory Visit: Payer: Self-pay | Admitting: Cardiovascular Disease

## 2020-03-11 ENCOUNTER — Other Ambulatory Visit: Payer: Self-pay

## 2020-03-11 ENCOUNTER — Telehealth: Payer: Self-pay | Admitting: Internal Medicine

## 2020-03-11 ENCOUNTER — Ambulatory Visit (INDEPENDENT_AMBULATORY_CARE_PROVIDER_SITE_OTHER): Payer: Medicare Other | Admitting: Internal Medicine

## 2020-03-11 ENCOUNTER — Other Ambulatory Visit: Payer: Self-pay | Admitting: *Deleted

## 2020-03-11 DIAGNOSIS — J849 Interstitial pulmonary disease, unspecified: Secondary | ICD-10-CM

## 2020-03-11 DIAGNOSIS — R059 Cough, unspecified: Secondary | ICD-10-CM

## 2020-03-11 LAB — PULMONARY FUNCTION TEST
DL/VA % pred: 121 %
DL/VA: 4.9 ml/min/mmHg/L
DLCO cor % pred: 89 %
DLCO cor: 24.75 ml/min/mmHg
DLCO unc % pred: 89 %
DLCO unc: 24.75 ml/min/mmHg
FEF 25-75 Pre: 3.27 L/s
FEF2575-%Pred-Pre: 120 %
FEV1-%Pred-Pre: 74 %
FEV1-Pre: 2.64 L
FEV1FVC-%Pred-Pre: 112 %
FEV6-%Pred-Pre: 69 %
FEV6-Pre: 3.16 L
FEV6FVC-%Pred-Pre: 105 %
FVC-%Pred-Pre: 65 %
FVC-Pre: 3.17 L
Pre FEV1/FVC ratio: 83 %
Pre FEV6/FVC Ratio: 100 %

## 2020-03-11 NOTE — Progress Notes (Signed)
Triage message sent to find out which CT scan needed.

## 2020-03-11 NOTE — Telephone Encounter (Signed)
Spoke with patient regarding prior message. Advised patient per [facility] do not recommend HRCT till feb 2022/march 2022 when it will be 1 year since his last CT which was feb 2021.Patient's voice was understanding. Nothing else further needed.

## 2020-03-11 NOTE — Telephone Encounter (Signed)
Patient just completed PFT today. Patient inquiring about CT scan order. Which CT order needed?  Pt last seen by TP on 02/13/20:  Assessment & Plan:   COPD with acute exacerbation ([place]) Slow to resolve flare.  Patient has underlying interstitial lung disease and emphysema.  Previous CT chest showed some bronchiectasis and interstitial changes. Chest x-ray today shows no acute pneumonia.  Will treat with broad-spectrum antibiotic with Augmentin increase pulmonary hygiene add in flutter valve.  Add in nebulized LAMA as he had perceived clinical benefit with Spiriva.  If Maretta Bees is not covered by his insurance can consider ipratropium nebulizer 3 times daily  Plan  Patient Instructions  Augmentin 875mg  Twice daily  For 7 days -take with food.  Mucinex Twice daily As needed  Cough /congestion  Add Flutter valve Twice daily   Add [employer] daily (if not covered can switch to Atrovent neb)  Add Delsym 2 tsp Twice daily  For severe cough .  CT as planned  Prednisone 20mg  daily  For 4 days .  Follow up with [name] as planned in October.  Please contact office for sooner follow up if symptoms do not improve or worsen or seek emergency care

## 2020-03-11 NOTE — Telephone Encounter (Signed)
Sorry patient advised me he had an upcoming CT chest . I will send to [name] to see if he was planning a repeat HRCT chest from the previous one he did earlier this year

## 2020-03-11 NOTE — Progress Notes (Signed)
Spirometry/DLCO performed today. 

## 2020-03-11 NOTE — Telephone Encounter (Signed)
No I did not order , please let patient know

## 2020-03-11 NOTE — Telephone Encounter (Signed)
Hi Tammy  Given stability in lung function - I do not recommend HRCT till feb 2022/march 2022 when it will be 1 year since his last CT which was feb 2021. However, if he wants it sooner or if you want it sooner we can reconsider    PFT Results Latest Ref Rng & Units 03/11/2020 09/25/2019  FVC-Pre L 3.17 3.15  FVC-Predicted Pre % 65 65  Pre FEV1/FVC % % 83 83  FEV1-Pre L 2.64 2.63  FEV1-Predicted Pre % 74 73  DLCO uncorrected ml/min/mmHg 24.75 21.79  DLCO UNC% % 89 78  DLCO corrected ml/min/mmHg 24.75 21.32  DLCO COR %Predicted % 89 76  DLVA Predicted % 121 112

## 2020-03-20 DIAGNOSIS — Z23 Encounter for immunization: Secondary | ICD-10-CM | POA: Diagnosis not present

## 2020-03-24 NOTE — Telephone Encounter (Signed)
mychart message sent by pt requesting to know results of PFT. Tammy, please advise.

## 2020-03-24 NOTE — Progress Notes (Signed)
Discharge Progress Report  Patient Details  Name: [name] MRN: [id number] Date of Birth: [birth year] Referring Provider:     Pulmonary Rehab Walk Test from 12/12/2019 in [place]  Referring Provider Brand Males, MD       Number of Visits: 8 discharge on 8/12 due to prolonged recovery from bronchitis  Reason for Discharge: Early discharge due to prolonged recovery from bronchitis  Smoking History:  Social History   Tobacco Use  Smoking Status Former Smoker  . Packs/day: 1.50  . Years: 24.00  . Pack years: 36.00  . Types: Cigarettes  . Quit date: 07/06/1987  . Years since quitting: 32.7  Smokeless Tobacco Never Used    Diagnosis:  Interstitial lung disease ([place])  ADL [facility]:  Pulmonary Assessment Scores    Row Name 12/12/19 1203 12/12/19 1204       ADL [facility]   ADL Phase -- Entry    SOB Score total -- 11      CAT Score   CAT Score -- 7      mMRC Score   mMRC Score 1 --           Initial Exercise Prescription:  Initial Exercise Prescription - 12/12/19 1100      Date of Initial Exercise RX and Referring Provider   Date 12/12/19    Referring Provider Brand Males, MD      Recumbant Bike   Level 2    Watts 35    Minutes 15    METs 3.38      NuStep   Level 3    SPM 85    Minutes 15    METs 3.3      Prescription Details   Frequency (times per week) 2    Duration Progress to 30 minutes of continuous aerobic without signs/symptoms of physical distress      Intensity   THRR 40-80% of Max Heartrate 60-121    Ratings of Perceived Exertion 11-13    Perceived Dyspnea 0-4      Progression   Progression Continue to progress workloads to maintain intensity without signs/symptoms of physical distress.      Resistance Training   Training Prescription Yes    Weight --   blue bands   Reps 10-15           Discharge Exercise Prescription (Final Exercise Prescription Changes):  Exercise Prescription  Changes - 01/24/20 1141      Response to Exercise   Blood Pressure (Admit) 104/60    Blood Pressure (Exit) 110/62    Heart Rate (Admit) 86 bpm    Heart Rate (Exercise) 114 bpm    Heart Rate (Exit) 95 bpm    Oxygen Saturation (Admit) 100 %    Oxygen Saturation (Exercise) 97 %    Oxygen Saturation (Exit) 98 %    Rating of Perceived Exertion (Exercise) 13    Perceived Dyspnea (Exercise) 1    Duration Continue with 30 min of aerobic exercise without signs/symptoms of physical distress.    Intensity THRR unchanged      Progression   Progression Continue to progress workloads to maintain intensity without signs/symptoms of physical distress.      Resistance Training   Training Prescription Yes    Weight blue bands    Reps 10-15    Time 10 Minutes      Recumbant Bike   Level 3    Minutes 15    METs 6.1  NuStep   Level 4    SPM 80    Minutes 15    METs 3.3           Functional Capacity:  6 Minute Walk    Row Name 12/12/19 1132         6 Minute Walk   Phase Initial     Distance 1471 feet     Walk Time 6 minutes     # of Rest Breaks 0     MPH 2.79     METS 3.53     RPE 11     Perceived Dyspnea  1     VO2 Peak 12.35     Symptoms No     Resting HR 71 bpm     Resting BP 110/70     Resting Oxygen Saturation  97 %     Exercise Oxygen Saturation  during 6 min walk 96 %     Max Ex. HR 111 bpm     Max Ex. BP 120/72     2 Minute Post BP 136/72       Interval HR   1 Minute HR 100     2 Minute HR 107     3 Minute HR 111     4 Minute HR 110     5 Minute HR 108     6 Minute HR 109     2 Minute Post HR 86     Interval Heart Rate? Yes       Interval Oxygen   Interval Oxygen? Yes     Baseline Oxygen Saturation % 97 %     1 Minute Oxygen Saturation % 97 %     1 Minute Liters of Oxygen 0 L     2 Minute Oxygen Saturation % 96 %     2 Minute Liters of Oxygen 0 L     3 Minute Oxygen Saturation % 97 %     3 Minute Liters of Oxygen 0 L     4 Minute Oxygen  Saturation % 98 %     4 Minute Liters of Oxygen 0 L     5 Minute Oxygen Saturation % 97 %     5 Minute Liters of Oxygen 0 L     6 Minute Oxygen Saturation % 98 %     6 Minute Liters of Oxygen 0 L     2 Minute Post Oxygen Saturation % 99 %     2 Minute Post Liters of Oxygen 0 L            Psychological, QOL, Others - Outcomes: PHQ 2/9: Depression screen PHQ 2/9 12/12/2019  Decreased Interest 0  Down, Depressed, Hopeless 0  PHQ - 2 Score 0  Altered sleeping 3  Tired, decreased energy 3  Change in appetite 1  Feeling bad or failure about yourself  0  Trouble concentrating 0  Moving slowly or fidgety/restless 0  Suicidal thoughts 0  Difficult doing work/chores Not difficult at all    Quality of Life:   Personal Goals: Goals established at orientation with interventions provided to work toward goal.  Personal Goals and Risk Factors at Admission - 12/12/19 1122      Core Components/Risk Factors/Patient Goals on Admission   Improve shortness of breath with ADL's Yes    Intervention Provide education, individualized exercise plan and daily activity instruction to help decrease symptoms of SOB with activities of daily living.    Expected  Outcomes Short Term: Improve cardiorespiratory fitness to achieve a reduction of symptoms when performing ADLs;Long Term: Be able to perform more ADLs without symptoms or delay the onset of symptoms            Personal Goals Discharge:  Goals and Risk Factor Review    Row Name 12/12/19 1123 12/12/19 1208 12/18/19 1539 01/14/20 1351 02/13/20 1546     Core Components/Risk Factors/Patient Goals Review   Personal Goals Review Increase knowledge of respiratory medications and ability to use respiratory devices properly.;Improve shortness of breath with ADL's;Develop more efficient breathing techniques such as purse lipped breathing and diaphragmatic breathing and practicing self-pacing with activity. Increase knowledge of respiratory medications and  ability to use respiratory devices properly.;Improve shortness of breath with ADL's;Develop more efficient breathing techniques such as purse lipped breathing and diaphragmatic breathing and practicing self-pacing with activity. Increase knowledge of respiratory medications and ability to use respiratory devices properly.;Improve shortness of breath with ADL's;Develop more efficient breathing techniques such as purse lipped breathing and diaphragmatic breathing and practicing self-pacing with activity. Increase knowledge of respiratory medications and ability to use respiratory devices properly.;Improve shortness of breath with ADL's;Develop more efficient breathing techniques such as purse lipped breathing and diaphragmatic breathing and practicing self-pacing with activity. Increase knowledge of respiratory medications and ability to use respiratory devices properly.;Improve shortness of breath with ADL's;Develop more efficient breathing techniques such as purse lipped breathing and diaphragmatic breathing and practicing self-pacing with activity.   Review -- -- [name] just started pulmonary rehab today, too earlt to see progression towards goals. [name] works hard when he exercises, he is level 4 on the nustep exercising @ 3.1 mets, and is level 3 on the recumbent bike.  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 Prior to his medical hold, pt engaged in consistent exercise here at [facility]monary [facility]hab. Unable to assess pt progress towards measurable goals.   Expected Outcomes -- -- See admission goals. See admission goals See Admission Goals   Row Name 03/24/20 1538             Core Components/Risk Factors/Patient Goals Review   Personal Goals Review Increase knowledge of respiratory medications and ability to use respiratory devices properly.;Improve shortness of breath with ADL's;Develop more efficient breathing techniques such as purse lipped breathing and diaphragmatic breathing and  practicing self-pacing with activity.       Review Pt completed 8 exercise sessions with discharge from the program on 8/12 mutually decided based upon prolonged recover from acute event. No post assessment were completd       Expected Outcomes Unable to measure progress toward goals              Exercise Goals and Review:  Exercise Goals    Row Name 12/12/19 1201 12/18/19 1446 01/15/20 0718 02/12/20 1149       Exercise Goals   Increase Physical Activity Yes Yes Yes Yes    Intervention Provide advice, education, support and counseling about physical activity/exercise needs.;Develop an individualized exercise prescription for aerobic and resistive training based on initial evaluation findings, risk stratification, comorbidities and participant's personal goals. Provide advice, education, support and counseling about physical activity/exercise needs.;Develop an individualized exercise prescription for aerobic and resistive training based on initial evaluation findings, risk stratification, comorbidities and participant's personal goals. Provide advice, education, support and counseling about physical activity/exercise needs.;Develop an individualized exercise prescription for aerobic and resistive training based on initial evaluation findings, risk stratification, comorbidities and participant's  personal goals. Provide advice, education, support and counseling about physical activity/exercise needs.;Develop an individualized exercise prescription for aerobic and resistive training based on initial evaluation findings, risk stratification, comorbidities and participant's personal goals.    Expected Outcomes Short Term: Attend rehab on a regular basis to increase amount of physical activity.;Long Term: Exercising regularly at least 3-5 days a week.;Long Term: Add in home exercise to make exercise part of routine and to increase amount of physical activity. Short Term: Attend rehab on a regular basis to  increase amount of physical activity.;Long Term: Exercising regularly at least 3-5 days a week.;Long Term: Add in home exercise to make exercise part of routine and to increase amount of physical activity. Short Term: Attend rehab on a regular basis to increase amount of physical activity.;Long Term: Exercising regularly at least 3-5 days a week.;Long Term: Add in home exercise to make exercise part of routine and to increase amount of physical activity. Short Term: Attend rehab on a regular basis to increase amount of physical activity.;Long Term: Exercising regularly at least 3-5 days a week.;Long Term: Add in home exercise to make exercise part of routine and to increase amount of physical activity.    Increase Strength and Stamina Yes Yes Yes Yes    Intervention Provide advice, education, support and counseling about physical activity/exercise needs.;Develop an individualized exercise prescription for aerobic and resistive training based on initial evaluation findings, risk stratification, comorbidities and participant's personal goals. Provide advice, education, support and counseling about physical activity/exercise needs.;Develop an individualized exercise prescription for aerobic and resistive training based on initial evaluation findings, risk stratification, comorbidities and participant's personal goals. Provide advice, education, support and counseling about physical activity/exercise needs.;Develop an individualized exercise prescription for aerobic and resistive training based on initial evaluation findings, risk stratification, comorbidities and participant's personal goals. Provide advice, education, support and counseling about physical activity/exercise needs.;Develop an individualized exercise prescription for aerobic and resistive training based on initial evaluation findings, risk stratification, comorbidities and participant's personal goals.    Expected Outcomes Short Term: Increase  workloads from initial exercise prescription for resistance, speed, and METs.;Short Term: Perform resistance training exercises routinely during rehab and add in resistance training at home;Long Term: Improve cardiorespiratory fitness, muscular endurance and strength as measured by increased METs and functional capacity (6MWT) Short Term: Increase workloads from initial exercise prescription for resistance, speed, and METs.;Short Term: Perform resistance training exercises routinely during rehab and add in resistance training at home;Long Term: Improve cardiorespiratory fitness, muscular endurance and strength as measured by increased METs and functional capacity (6MWT) Short Term: Increase workloads from initial exercise prescription for resistance, speed, and METs.;Short Term: Perform resistance training exercises routinely during rehab and add in resistance training at home;Long Term: Improve cardiorespiratory fitness, muscular endurance and strength as measured by increased METs and functional capacity (6MWT) Short Term: Increase workloads from initial exercise prescription for resistance, speed, and METs.;Short Term: Perform resistance training exercises routinely during rehab and add in resistance training at home;Long Term: Improve cardiorespiratory fitness, muscular endurance and strength as measured by increased METs and functional capacity (6MWT)    Able to understand and use rate of perceived exertion (RPE) scale Yes Yes Yes Yes    Intervention Provide education and explanation on how to use RPE scale Provide education and explanation on how to use RPE scale Provide education and explanation on how to use RPE scale Provide education and explanation on how to use RPE scale    Expected Outcomes Short Term: Able to use  RPE daily in rehab to express subjective intensity level;Long Term:  Able to use RPE to guide intensity level when exercising independently Short Term: Able to use RPE daily in rehab to  express subjective intensity level;Long Term:  Able to use RPE to guide intensity level when exercising independently Short Term: Able to use RPE daily in rehab to express subjective intensity level;Long Term:  Able to use RPE to guide intensity level when exercising independently Short Term: Able to use RPE daily in rehab to express subjective intensity level;Long Term:  Able to use RPE to guide intensity level when exercising independently    Able to understand and use Dyspnea scale Yes Yes Yes Yes    Intervention Provide education and explanation on how to use Dyspnea scale Provide education and explanation on how to use Dyspnea scale Provide education and explanation on how to use Dyspnea scale Provide education and explanation on how to use Dyspnea scale    Expected Outcomes Short Term: Able to use Dyspnea scale daily in rehab to express subjective sense of shortness of breath during exertion;Long Term: Able to use Dyspnea scale to guide intensity level when exercising independently Short Term: Able to use Dyspnea scale daily in rehab to express subjective sense of shortness of breath during exertion;Long Term: Able to use Dyspnea scale to guide intensity level when exercising independently Short Term: Able to use Dyspnea scale daily in rehab to express subjective sense of shortness of breath during exertion;Long Term: Able to use Dyspnea scale to guide intensity level when exercising independently Short Term: Able to use Dyspnea scale daily in rehab to express subjective sense of shortness of breath during exertion;Long Term: Able to use Dyspnea scale to guide intensity level when exercising independently    Knowledge and understanding of Target Heart Rate Range (THRR) Yes Yes Yes Yes    Intervention Provide education and explanation of THRR including how the numbers were predicted and where they are located for reference Provide education and explanation of THRR including how the numbers were predicted  and where they are located for reference Provide education and explanation of THRR including how the numbers were predicted and where they are located for reference Provide education and explanation of THRR including how the numbers were predicted and where they are located for reference    Expected Outcomes Short Term: Able to state/look up THRR;Long Term: Able to use THRR to govern intensity when exercising independently;Short Term: Able to use daily as guideline for intensity in rehab Short Term: Able to state/look up THRR;Long Term: Able to use THRR to govern intensity when exercising independently;Short Term: Able to use daily as guideline for intensity in rehab Short Term: Able to state/look up THRR;Long Term: Able to use THRR to govern intensity when exercising independently;Short Term: Able to use daily as guideline for intensity in rehab Short Term: Able to state/look up THRR;Long Term: Able to use THRR to govern intensity when exercising independently;Short Term: Able to use daily as guideline for intensity in rehab    Understanding of Exercise Prescription Yes Yes Yes Yes    Intervention Provide education, explanation, and written materials on patient's individual exercise prescription Provide education, explanation, and written materials on patient's individual exercise prescription Provide education, explanation, and written materials on patient's individual exercise prescription Provide education, explanation, and written materials on patient's individual exercise prescription    Expected Outcomes Short Term: Able to explain program exercise prescription;Long Term: Able to explain home exercise prescription to exercise independently Short  Term: Able to explain program exercise prescription;Long Term: Able to explain home exercise prescription to exercise independently Short Term: Able to explain program exercise prescription;Long Term: Able to explain home exercise prescription to exercise  independently Short Term: Able to explain program exercise prescription;Long Term: Able to explain home exercise prescription to exercise independently           Exercise Goals Re-Evaluation:  Exercise Goals Re-Evaluation    Row Name 12/18/19 1444 01/15/20 0718 02/12/20 1149         Exercise Goal Re-Evaluation   Exercise Goals Review Increase Physical Activity;Increase Strength and Stamina;Able to understand and use rate of perceived exertion (RPE) scale;Able to understand and use Dyspnea scale;Knowledge and understanding of Target Heart Rate Range (THRR);Understanding of Exercise Prescription Increase Physical Activity;Increase Strength and Stamina;Able to understand and use rate of perceived exertion (RPE) scale;Able to understand and use Dyspnea scale;Knowledge and understanding of Target Heart Rate Range (THRR);Understanding of Exercise Prescription Increase Physical Activity;Increase Strength and Stamina;Able to understand and use rate of perceived exertion (RPE) scale;Able to understand and use Dyspnea scale;Knowledge and understanding of Target Heart Rate Range (THRR);Understanding of Exercise Prescription     Comments Pt has completed 1 exercise session. He has a positive attitude and is eager to exercise. He exercised at 3.6 METs on the recumbent bike. Will monitor and progress as able. Pt has completed 7 exercise sessions. He is eager for workload increases. He is exercising at 6.1 METs on th recumbent bike. Will continue to monitor and progress as able. Pt has completed 8 exercise sessions. He was on vacation for two weeks, returned for one visit, and has missed the past several weeks due to being sick. He is set to graduate 8/12, so he will likely be discharged from the program.     Expected Outcomes Through exercise at rehab and at home, the patient will decrease shortness of breath with daily activities and feel confident in carrying out an exercise regime at home. Through exercise at  rehab and at home, the patient will decrease shortness of breath with daily activities and feel confident in carrying out an exercise regime at home. Through exercise at rehab and at home, the patient will decrease shortness of breath with daily activities and feel confident in carrying out an exercise regime at home.            Nutrition & Weight - Outcomes:  Pre Biometrics - 12/12/19 1120      Pre Biometrics   Grip Strength 36 kg            Nutrition:  Nutrition Therapy & Goals - 12/20/19 1323      Nutrition Therapy   Diet Generally health/carb modified      Personal Nutrition Goals   Nutrition Goal Pt to maintain current healthy diet    Personal Goal #2 Pt to reduce afternoon ice cream portions      Intervention Plan   Intervention Nutrition handout(s) given to patient.;Prescribe, educate and counsel regarding individualized specific dietary modifications aiming towards targeted core components such as weight, hypertension, lipid management, diabetes, heart failure and other comorbidities.    Expected Outcomes Short Term Goal: A plan has been developed with personal nutrition goals set during dietitian appointment.;Long Term Goal: Adherence to prescribed nutrition plan.           Nutrition Discharge:  Nutrition Assessments - 12/20/19 1324      Rate Your Plate Scores   Pre Score 61  Education Questionnaire Score:  Knowledge Questionnaire Score - 12/12/19 1206      Knowledge Questionnaire Score   Pre Score 14/18           Goals reviewed with patient. Cherre Huger, BSN Cardiac and Training and development officer

## 2020-03-24 NOTE — Telephone Encounter (Signed)
Called and spoke with pt. I have scheduled pt an OV to discuss PFT results. Nothing further needed.

## 2020-03-24 NOTE — Telephone Encounter (Signed)
Can you please print the PFT, I could not get the full report to print. He was suppose to make ov with [name] with PFT to review at [facility] but looks like the PFT only got done .  If you can get me the results will call with results

## 2020-03-24 NOTE — Addendum Note (Signed)
Encounter addended by: Rowe Pavy, RN on: 03/24/2020 3:45 PM  Actions taken: Episode resolved, Flowsheet data copied forward, Flowsheet accepted, Clinical Note Signed

## 2020-03-25 ENCOUNTER — Other Ambulatory Visit: Payer: Self-pay

## 2020-03-25 ENCOUNTER — Encounter: Payer: Self-pay | Admitting: Pulmonary Disease

## 2020-03-25 ENCOUNTER — Ambulatory Visit (INDEPENDENT_AMBULATORY_CARE_PROVIDER_SITE_OTHER): Payer: Medicare Other | Admitting: Pulmonary Disease

## 2020-03-25 VITALS — BP 110/70 | HR 76 | Temp 96.7°F | Ht 70.0 in | Wt 181.4 lb

## 2020-03-25 DIAGNOSIS — J432 Centrilobular emphysema: Secondary | ICD-10-CM | POA: Diagnosis not present

## 2020-03-25 DIAGNOSIS — R918 Other nonspecific abnormal finding of lung field: Secondary | ICD-10-CM

## 2020-03-25 DIAGNOSIS — J479 Bronchiectasis, uncomplicated: Secondary | ICD-10-CM | POA: Diagnosis not present

## 2020-03-25 DIAGNOSIS — J9611 Chronic respiratory failure with hypoxia: Secondary | ICD-10-CM | POA: Diagnosis not present

## 2020-03-25 DIAGNOSIS — J471 Bronchiectasis with (acute) exacerbation: Secondary | ICD-10-CM | POA: Insufficient documentation

## 2020-03-25 DIAGNOSIS — J849 Interstitial pulmonary disease, unspecified: Secondary | ICD-10-CM | POA: Diagnosis not present

## 2020-03-25 NOTE — Assessment & Plan Note (Signed)
Plan: Walk in office today, stable no oxygen desaturations

## 2020-03-25 NOTE — Progress Notes (Signed)
@Patient  ID: [name], male    DOB: February 27, 1951, 69 y.o.   MRN: [id number]  Chief Complaint  Patient presents with   Follow-up    ILD    Referring provider: Jonathon Jordan, MD  HPI:  69 year old male former smoker followed in our office for chronic respiratory failure and COPD  PMH: Hyperlipidemia, depression, GERD Smoker/ Smoking History: Former smoker.  Quit 1989.  36-pack-year smoking history. Maintenance: None Pt of: [name]  03/25/2020  - Visit   69 year old male former smoker followed in our office for chronic respiratory failure and COPD.  Followed by [name] patient was last seen in our office on 02/13/2020.  He was seen by TP NP at that office visit is recommended the patient be treated with Augmentin, prednisone, add Yupelri nebulized medication, obtain pulmonary function testing.   Patient reports that he is feeling significantly better since last being treated in August/2021.  He feels he is back to his baseline.  He is not still currently using Yupelri.  He is unsure if this actually helped him.  He reports it is difficult for him to assess given the fact that he was being acutely treated as a COPD exacerbation while he was trialed on it.  Patient would like to review most recent pulmonary function test today.  He reports that most recent plan as discussed with him by [name] was to repeat a CT scan next year to follow the February/2021 HRCT.   Questionaires / Pulmonary Flowsheets:   ACT:  No flowsheet data found.  MMRC: No flowsheet data found.  Epworth:  No flowsheet data found.  Tests:   02/13/2020-chest x-ray-no active cardiopulmonary disease  03/11/2020-pulmonary function test-FVC 3.17 (65% predicted), ratio 83, FEV1 2.64 (74% predicted), DLCO 24.75 (89% predicted)  11/09/2019-echocardiogram-LV ejection fraction 65 to 70%, estimated right ventricular systolic pressure is 62.8  08/30/2019-CT chest high-res-mild tubular bronchiectasis  and bronchial wall thickening throughout with very fine predominantly peripheral centrilobular nodularity in the upper lungs, regular interstitial opacity which is slightly increased, findings are indeterminate for UIP, consider annual CT follow-up to assess for stability of fibrotic findings and pattern of fibrotic interstitial lung disease remains suspected, minimal paraseptal emphysema, multiple small bilateral pulmonary nodules measuring up to 6 mm, CAD  FENO:  Lab Results  Component Value Date   NITRICOXIDE 14 05/26/2016    PFT: PFT Results Latest Ref Rng & Units 03/11/2020 09/25/2019  FVC-Pre L 3.17 3.15  FVC-Predicted Pre % 65 65  Pre FEV1/FVC % % 83 83  FEV1-Pre L 2.64 2.63  FEV1-Predicted Pre % 74 73  DLCO uncorrected ml/min/mmHg 24.75 21.79  DLCO UNC% % 89 78  DLCO corrected ml/min/mmHg 24.75 21.32  DLCO COR %Predicted % 89 76  DLVA Predicted % 121 112    WALK:  SIX MIN WALK 12/12/2019 10/29/2019 09/12/2015  Supplimental Oxygen during Test? (L/min) - No No  2 Minute Oxygen Saturation % 96 - -  2 Minute HR 107 - -  4 Minute Oxygen Saturation % 98 - -  4 Minute HR 110 - -  6 Minute Oxygen Saturation % 98 - -  6 Minute HR 109 - -  Tech Comments: - Pt walked at a moderate pace completing all required laps having complaints of mild SOB. slow to normal pace/no SOB//lmr    Imaging: No results found.  Lab Results:  CBC    Component Value Date/Time   WBC 5.3 08/17/2019 1517   WBC 8.2 07/17/2019 1421  RBC 5.05 08/17/2019 1517   HGB 15.4 08/17/2019 1517   HCT 46.4 08/17/2019 1517   PLT 112 (L) 08/17/2019 1517   MCV 91.9 08/17/2019 1517   MCH 30.5 08/17/2019 1517   MCHC 33.2 08/17/2019 1517   RDW 11.9 08/17/2019 1517   LYMPHSABS 1.4 08/17/2019 1517   MONOABS 0.4 08/17/2019 1517   EOSABS 0.2 08/17/2019 1517   BASOSABS 0.0 08/17/2019 1517    BMET    Component Value Date/Time   NA 143 08/17/2019 1517   NA 142 02/13/2018 0851   K 3.9 08/17/2019 1517   CL 106  08/17/2019 1517   CO2 27 08/17/2019 1517   GLUCOSE 103 (H) 08/17/2019 1517   BUN 13 08/17/2019 1517   BUN 16 02/13/2018 0851   CREATININE 1.01 08/17/2019 1517   CALCIUM 8.8 (L) 08/17/2019 1517   CALCIUM 9.0 02/19/2011 1233   GFRNONAA >60 08/17/2019 1517   GFRAA >60 08/17/2019 1517    BNP    Component Value Date/Time   BNP 42.2 06/19/2019 0902    ProBNP    Component Value Date/Time   PROBNP 71 10/24/2019 1124    Specialty Problems      Pulmonary Problems   SINUSITIS, CHRONIC    Annotation: chronic and recurrent, s/p sinus surgery and L mastoidectomy Qualifier: Diagnosis of  By: Megan Salon MD, John        Upper airway cough syndrome    Cyclical cough rx  3/33/5456 >>>  - CT sinus 08/29/2015 neg  - Seen 09/24/15 by Constance Holster cc sore throat and hoarseness > laryngoscopy c/w erythema VCs only - neuorontin added 09/02/15 > stopped neurontin 09/26/15 > cough worse see ov   10/02/2015  - CT chest 12/29/15 stable 5 mm nodules > no f/u indicated  - FENO 05/26/2016  =   14 - Spirometry 05/26/2016  wnl including f/v curve s rx prior   -  rechallenge with gabapentin 100 mg bid 05/26/2016 > marked improvement 09/13/2016       CAP (community acquired pneumonia)    See cxr 09/02/2015 > rx levaquin 500 mg x 7 days > admit with influenza > cxr 10/02/2015 marked improvement, min residual changes L > RUL      Acute respiratory distress   Dyspnea on exertion   Influenza   Cough   Chronic respiratory failure with hypoxia (HCC)    09/12/2015  Walked RA x 3 laps @ 185 ft each stopped due to  End of study, slow  pace, no sob or desat     rec as of 09/12/2015 02 2lpm hs only > pt d/c on his own 10/02/2015       COPD with acute exacerbation (HCC)   Bronchiectasis without complication (HCC)   Centrilobular emphysema (HCC)   ILD (interstitial lung disease) (HCC)      Allergies  Allergen Reactions   Bupropion Other (See Comments)    Caused altered mental status Altered mental status Caused  altered mental status   Ciprofloxacin-Dexamethasone Other (See Comments)    Burning in ears when Cipro ear drops were used unknown Burning in ears when Cipro ear drops were used   Gabapentin Other (See Comments)    Caused weakness, dizziness, and forgetfulness  Weakness and dizziness with higher doses. 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 last stress test 2003   Pneumonia 2009   Tumor cells, benign    lung, left side   Unstable angina ([place]) 10/28/2017    Tobacco History: Social History   Tobacco Use  Smoking Status Former Smoker   Packs/day: 1.50   Years: 24.00   Pack years: 36.00   Types: Cigarettes   Quit date: 07/06/1987   Years since quitting: 32.7  Smokeless Tobacco Never Used   Counseling given: Not Answered   Continue to not smoke  Outpatient Encounter Medications as of 03/25/2020  Medication Sig   aspirin 81 MG chewable tablet Chew 81 mg by mouth daily.   famotidine (PEPCID) 20 MG tablet Take 1 tablet (20 mg total) by mouth at bedtime.   FLUoxetine (PROZAC) 40 MG capsule Take 40 mg by mouth every morning.     methylphenidate (RITALIN) 10 MG tablet Take 10 mg by mouth 3 (three) times daily.     metoprolol succinate (TOPROL-XL) 25 MG 24 hr tablet TAKE 1 TABLET(25 MG) BY MOUTH DAILY   mirtazapine (REMERON) 15 MG tablet Take 15 mg by mouth at bedtime.   Multiple Vitamin (MULTIVITAMIN) tablet Take 1 tablet by mouth daily.   nitroGLYCERIN (NITROSTAT) 0.4 MG SL tablet Place 0.4 mg under the tongue every 5 (five) minutes as needed for chest pain.    omeprazole (PRILOSEC) 20 MG capsule Take 20 mg by mouth 2 (two) times daily before a meal.    polyethylene glycol (MIRALAX / GLYCOLAX) packet Take 17 g by mouth daily as needed (for constipation.).    predniSONE (DELTASONE) 20 MG tablet Take 1 tablet (20 mg total) by mouth daily with breakfast.   revefenacin (YUPELRI) 175 MCG/3ML nebulizer solution Take 3 mLs (175 mcg total) by nebulization daily.   rosuvastatin (CRESTOR) 10 MG tablet TAKE 1 TABLET(10 MG) BY MOUTH DAILY   SYNTHROID 25 MCG tablet Take 25 mcg by mouth daily before breakfast.    tamsulosin (FLOMAX) 0.4 MG CAPS capsule Take 0.4 mg by mouth daily after breakfast.    No facility-administered encounter medications on file as of 03/25/2020.     Review of Systems  Review of Systems  Constitutional: Positive for fatigue. Negative for activity change, chills, fever and unexpected weight change.  HENT: Negative for postnasal drip, rhinorrhea, sinus pressure, sinus pain and sore throat.   Eyes: Negative.   Respiratory: Positive for shortness of breath. Negative for cough and wheezing.   Cardiovascular: Negative for chest pain and palpitations.  Gastrointestinal: Negative for constipation, diarrhea, nausea and vomiting.  Endocrine: Negative.   Genitourinary: Negative.   Musculoskeletal: Negative.   Skin: Negative.   Neurological: Negative for dizziness and headaches.  Psychiatric/Behavioral: Negative.  Negative for dysphoric mood. The patient is not nervous/anxious.   All other systems  reviewed and  are negative.    Physical Exam  BP 110/70 (BP Location: Left Arm, Cuff Size: Normal)    Pulse 76    Temp (!) 96.7 F (35.9 C) (Oral)    Ht 5\' 10"  (1.778 m)    Wt 181 lb 6.4 oz (82.3 kg)    SpO2 99%    BMI 26.03 kg/m   Wt Readings from Last 5 Encounters:  03/25/20 181 lb 6.4 oz (82.3 kg)  02/13/20 176 lb (79.8 kg)  01/29/20 177 lb 0.5 oz (80.3 kg)  01/15/20 176 lb 9.4 oz (80.1 kg)  01/01/20 174 lb 13.2 oz (79.3 kg)    BMI Readings from Last 5 Encounters:  03/25/20 26.03 kg/m  02/13/20 24.55 kg/m  01/29/20 24.69 kg/m  01/15/20 24.63 kg/m  01/01/20 24.38 kg/m     Physical Exam Vitals and nursing note reviewed.  Constitutional:      General: He is not in acute distress.    Appearance: Normal appearance. He is normal weight.  HENT:     Head: Normocephalic and atraumatic.     Right Ear: Hearing and external ear normal.     Left Ear: Hearing and external ear normal.     Nose: Nose normal. No mucosal edema or rhinorrhea.     Right Turbinates: Not enlarged.     Left Turbinates: Not enlarged.     Mouth/Throat:     Mouth: Mucous membranes are dry.     Pharynx: Oropharynx is clear. No oropharyngeal exudate.  Eyes:     Pupils: Pupils are equal, round, and reactive to light.  Cardiovascular:     Rate and Rhythm: Normal rate and regular rhythm.     Pulses: Normal pulses.     Heart sounds: Normal heart sounds. No murmur heard.   Pulmonary:     Effort: Pulmonary effort is normal.     Breath sounds: Normal breath sounds. No decreased breath sounds, wheezing or rales.  Musculoskeletal:     Cervical back: Normal range of motion.     Right lower leg: No edema.     Left lower leg: No edema.  Lymphadenopathy:     Cervical: No cervical adenopathy.  Skin:    General: Skin is warm and dry.     Capillary Refill: Capillary refill takes less than 2 seconds.     Findings: No erythema or rash.  Neurological:     General: No focal deficit present.     Mental Status:  He is alert and oriented to person, place, and time.     Motor: No weakness.     Coordination: Coordination normal.     Gait: Gait is intact. Gait normal.  Psychiatric:        Mood and Affect: Mood normal.        Behavior: Behavior normal. Behavior is cooperative.        Thought Content: Thought content normal.        Judgment: Judgment normal.       Assessment & Plan:   ILD (interstitial lung disease) ([place]) Indeterminate on high-resolution CT chest Radiology recommending 1 year follow-up  Reviewed pulmonary function testing today with patient  Plan: Obtain 60-month spirometry with DLCO Obtain repeat high-resolution CT chest in February/2022 49-month follow-up with [name]  Chronic respiratory failure with hypoxia [facility]) Plan: Walk in office today, stable no oxygen desaturations  Centrilobular emphysema ([place]) Plan: Trial of Yupelri Notify our office when you are 2 weeks into your Yupelri trial to let us know if  you feel that you are benefiting from this 38-month follow-up with [name]  Bronchiectasis without complication [facility]) CT chest shows tubular bronchiectasis  Plan: Start flutter valve today Reviewed bronchitic management    Return in about 3 months (around 06/24/2020), or if symptoms worsen or fail to improve, for Follow up with Dr. Purnell Shoemaker, ILD clinic - 31min slot.   Lauraine Rinne, NP 03/25/2020   This appointment required 34 minutes of patient care (this includes precharting, chart review, review of results, face-to-face care, etc.).

## 2020-03-25 NOTE — Assessment & Plan Note (Signed)
Plan: Trial of Yupelri Notify our office when you are 2 weeks into your Yupelri trial to let us know if you feel that you are benefiting from this 56-month follow-up with [name]

## 2020-03-25 NOTE — Patient Instructions (Addendum)
You were seen today by Lauraine Rinne, NP  for:   1. ILD (interstitial lung disease) ([place])  - CT Chest High Resolution; Future - Pulmonary function test; Future  Walk today in office   2. Chronic respiratory failure with hypoxia (HCC)  Continue oxygen therapy as prescribed  >>>maintain oxygen saturations greater than 88 percent  >>>if unable to maintain oxygen saturations please contact the office  >>>do not smoke with oxygen  >>>can use nasal saline gel or nasal saline rinses to moisturize nose if oxygen causes dryness  3. Bronchiectasis without complication (HCC)  Bronchiectasis: This is the medical term which indicates that you have damage, dilated airways making you more susceptible to respiratory infection. Use a flutter valve 10 breaths twice a day or 4 to 5 breaths 4-5 times a day to help clear mucus out Let us know if you have cough with change in mucus color or fevers or chills.  At that point you would need an antibiotic. Maintain a healthy nutritious diet, eating whole foods Take your medications as prescribed    4. Centrilobular emphysema (HCC)  Trial of Yupelri nebulized meds daily  Notify our office if you feel like this is helping your breathing after completing 2 weeks of treatment  Note your daily symptoms > remember "red flags" for COPD:   >>>Increase in cough >>>increase in sputum production >>>increase in shortness of breath or activity  intolerance.   If you notice these symptoms, please call the office to be seen.   5. Abnormal findings on diagnostic imaging of lung  Obtain high-resolution CT chest imaging in February/2022  We recommend today:  Orders Placed This Encounter  Procedures  . CT Chest High Resolution    -High-res CT with supine and prone positioning -inspiratory and expiratory cuts.  -Only to be read by Dr. Rosario Jacks and Dr. Weber Cooks.    Standing Status:   Future    Standing Expiration Date:   03/25/2021    Scheduling Instructions:      Feb/2022    Order Specific Question:   Preferred imaging location?    Answer:   [place]    Order Specific Question:   Radiology Contrast Protocol - do NOT remove file path    Answer:   [email or web address]  . Pulmonary function test    Arlyce Harman with dlco    Standing Status:   Future    Standing Expiration Date:   03/25/2021    Scheduling Instructions:     March/2022    Order Specific Question:   Where should this test be performed?    Answer:   [facility]   Orders Placed This Encounter  Procedures  . CT Chest High Resolution  . Pulmonary function test   No orders of the defined types were placed in this encounter.   Follow Up:    Return in about 3 months (around 06/24/2020), or if symptoms worsen or fail to improve, for Follow up with Dr. Purnell Shoemaker, ILD clinic - 35min slot.   Notification of test results are managed in the following manner: If there are  any recommendations or changes to the  plan of care discussed in office today,  we will contact you and let you know what they are. If you do not hear from [place], then your results are normal and you can view them through your  MyChart account , or a letter will be sent to you. Thank you again for trusting [place] with your care  -  Thank you, [facility]    It is flu season:   >>> Best ways to protect herself from the flu: Receive the yearly flu vaccine, practice good hand hygiene washing with soap and also using hand sanitizer when available, eat a nutritious meals, get adequate rest, hydrate appropriately       Please contact the office if your symptoms worsen or you have concerns that you are not improving.   Thank you for choosing  [facility] for your healthcare, and for allowing [place] to partner with you on your healthcare journey. I am thankful to be able to provide care to you today.   Wyn Quaker FNP-C

## 2020-03-25 NOTE — Assessment & Plan Note (Signed)
CT chest shows tubular bronchiectasis  Plan: Start flutter valve today Reviewed bronchitic management

## 2020-03-25 NOTE — Assessment & Plan Note (Signed)
Indeterminate on high-resolution CT chest Radiology recommending 1 year follow-up  Reviewed pulmonary function testing today with patient  Plan: Obtain 6-month spirometry with DLCO Obtain repeat high-resolution CT chest in February/2022 30-month follow-up with [name]

## 2020-04-01 DIAGNOSIS — L905 Scar conditions and fibrosis of skin: Secondary | ICD-10-CM | POA: Diagnosis not present

## 2020-04-01 DIAGNOSIS — Z85820 Personal history of malignant melanoma of skin: Secondary | ICD-10-CM | POA: Diagnosis not present

## 2020-04-01 DIAGNOSIS — D1801 Hemangioma of skin and subcutaneous tissue: Secondary | ICD-10-CM | POA: Diagnosis not present

## 2020-04-01 DIAGNOSIS — L821 Other seborrheic keratosis: Secondary | ICD-10-CM | POA: Diagnosis not present

## 2020-04-15 DIAGNOSIS — Z23 Encounter for immunization: Secondary | ICD-10-CM | POA: Diagnosis not present

## 2020-04-21 DIAGNOSIS — R051 Acute cough: Secondary | ICD-10-CM | POA: Diagnosis not present

## 2020-04-25 ENCOUNTER — Ambulatory Visit
Admission: RE | Admit: 2020-04-25 | Discharge: 2020-04-25 | Disposition: A | Discharge status: Home / Self Care | Payer: Medicare Other | Source: Ambulatory Visit | Attending: Family Medicine | Admitting: Family Medicine

## 2020-04-25 ENCOUNTER — Other Ambulatory Visit: Payer: Self-pay | Admitting: Family Medicine

## 2020-04-25 DIAGNOSIS — R059 Cough, unspecified: Secondary | ICD-10-CM | POA: Diagnosis not present

## 2020-05-07 DIAGNOSIS — M67911 Unspecified disorder of synovium and tendon, right shoulder: Secondary | ICD-10-CM | POA: Diagnosis not present

## 2020-06-04 DIAGNOSIS — R413 Other amnesia: Secondary | ICD-10-CM | POA: Diagnosis not present

## 2020-06-04 DIAGNOSIS — H8110 Benign paroxysmal vertigo, unspecified ear: Secondary | ICD-10-CM | POA: Diagnosis not present

## 2020-06-23 ENCOUNTER — Encounter: Payer: Self-pay | Admitting: Podiatry

## 2020-06-23 ENCOUNTER — Other Ambulatory Visit: Payer: Self-pay

## 2020-06-23 ENCOUNTER — Ambulatory Visit (INDEPENDENT_AMBULATORY_CARE_PROVIDER_SITE_OTHER): Payer: Medicare Other | Admitting: Podiatry

## 2020-06-23 VITALS — BP 120/71 | HR 59 | Temp 97.2°F

## 2020-06-23 DIAGNOSIS — F330 Major depressive disorder, recurrent, mild: Secondary | ICD-10-CM | POA: Insufficient documentation

## 2020-06-23 DIAGNOSIS — J309 Allergic rhinitis, unspecified: Secondary | ICD-10-CM | POA: Insufficient documentation

## 2020-06-23 DIAGNOSIS — G4700 Insomnia, unspecified: Secondary | ICD-10-CM | POA: Insufficient documentation

## 2020-06-23 DIAGNOSIS — B351 Tinea unguium: Secondary | ICD-10-CM | POA: Diagnosis not present

## 2020-06-23 DIAGNOSIS — I059 Rheumatic mitral valve disease, unspecified: Secondary | ICD-10-CM | POA: Insufficient documentation

## 2020-06-23 DIAGNOSIS — G259 Extrapyramidal and movement disorder, unspecified: Secondary | ICD-10-CM | POA: Insufficient documentation

## 2020-06-23 DIAGNOSIS — I679 Cerebrovascular disease, unspecified: Secondary | ICD-10-CM | POA: Insufficient documentation

## 2020-06-23 DIAGNOSIS — E039 Hypothyroidism, unspecified: Secondary | ICD-10-CM | POA: Insufficient documentation

## 2020-06-23 DIAGNOSIS — R413 Other amnesia: Secondary | ICD-10-CM | POA: Insufficient documentation

## 2020-06-23 DIAGNOSIS — N281 Cyst of kidney, acquired: Secondary | ICD-10-CM | POA: Insufficient documentation

## 2020-06-23 DIAGNOSIS — D72821 Monocytosis (symptomatic): Secondary | ICD-10-CM | POA: Insufficient documentation

## 2020-06-23 DIAGNOSIS — R911 Solitary pulmonary nodule: Secondary | ICD-10-CM | POA: Insufficient documentation

## 2020-06-23 DIAGNOSIS — F03918 Unspecified dementia, unspecified severity, with other behavioral disturbance: Secondary | ICD-10-CM | POA: Insufficient documentation

## 2020-06-23 DIAGNOSIS — M79675 Pain in left toe(s): Secondary | ICD-10-CM

## 2020-06-23 DIAGNOSIS — H9190 Unspecified hearing loss, unspecified ear: Secondary | ICD-10-CM | POA: Insufficient documentation

## 2020-06-23 DIAGNOSIS — M79674 Pain in right toe(s): Secondary | ICD-10-CM | POA: Diagnosis not present

## 2020-06-23 DIAGNOSIS — M953 Acquired deformity of neck: Secondary | ICD-10-CM | POA: Insufficient documentation

## 2020-06-23 DIAGNOSIS — R591 Generalized enlarged lymph nodes: Secondary | ICD-10-CM | POA: Insufficient documentation

## 2020-06-23 DIAGNOSIS — I700 Atherosclerosis of aorta: Secondary | ICD-10-CM | POA: Insufficient documentation

## 2020-06-23 DIAGNOSIS — M4802 Spinal stenosis, cervical region: Secondary | ICD-10-CM | POA: Insufficient documentation

## 2020-06-23 DIAGNOSIS — R432 Parageusia: Secondary | ICD-10-CM | POA: Insufficient documentation

## 2020-06-23 DIAGNOSIS — S2239XA Fracture of one rib, unspecified side, initial encounter for closed fracture: Secondary | ICD-10-CM | POA: Insufficient documentation

## 2020-06-23 DIAGNOSIS — I2510 Atherosclerotic heart disease of native coronary artery without angina pectoris: Secondary | ICD-10-CM | POA: Insufficient documentation

## 2020-06-23 DIAGNOSIS — M792 Neuralgia and neuritis, unspecified: Secondary | ICD-10-CM | POA: Insufficient documentation

## 2020-06-23 DIAGNOSIS — B009 Herpesviral infection, unspecified: Secondary | ICD-10-CM | POA: Insufficient documentation

## 2020-06-23 DIAGNOSIS — D692 Other nonthrombocytopenic purpura: Secondary | ICD-10-CM | POA: Insufficient documentation

## 2020-06-23 DIAGNOSIS — C439 Malignant melanoma of skin, unspecified: Secondary | ICD-10-CM | POA: Insufficient documentation

## 2020-06-23 DIAGNOSIS — H269 Unspecified cataract: Secondary | ICD-10-CM | POA: Insufficient documentation

## 2020-06-23 DIAGNOSIS — R7303 Prediabetes: Secondary | ICD-10-CM | POA: Insufficient documentation

## 2020-06-23 DIAGNOSIS — K8020 Calculus of gallbladder without cholecystitis without obstruction: Secondary | ICD-10-CM | POA: Insufficient documentation

## 2020-06-23 HISTORY — DX: Malignant melanoma of skin, unspecified: C43.9

## 2020-06-23 MED ORDER — NONFORMULARY OR COMPOUNDED ITEM: 3 refills | Status: DC

## 2020-06-23 NOTE — Patient Instructions (Signed)

## 2020-06-29 NOTE — Progress Notes (Signed)
Subjective: [name] presents todayfor complaint of painful thick toenails that are difficult to trim. Pain interferes with ambulation. Aggravating factors include wearing enclosed shoe gear. Pain is relieved with periodic professional debridement.   His wife is present during today's visit.   Toenails have been thick and discolored for some time and have become more difficult for him to manage.   PCP is [name] and lastvisit was 06/04/2020.  Past Medical History:  Diagnosis Date  . Arthritis   . Bronchitis    history of  . 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 GERD (gastroesophageal reflux disease)   . Hiatal hernia   . 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 History of echocardiogram    Echo 11/2019: EF 65-70, no R WMA, mild concentric LVH, normal RV SF, normal PASP (RVSP 19.2 mmHg), trivial MR  . Hypercalcemia   . Lipoma    left forearm (3)  . Malignant melanoma of skin ([place]) 06/23/2020  . Mitral valve prolapse    does not see cardiologist for. last stress test 2003  . Pneumonia 2009  . Tumor cells, benign    lung, left side  . Unstable angina ([place]) 10/28/2017     Patient Active Problem List   Diagnosis Date Noted  . Acquired deformity of neck 06/23/2020  . Allergic rhinitis 06/23/2020  . Amnesia 06/23/2020  . Cataract 06/23/2020  . Cerebrovascular disease 06/23/2020  . Closed fracture of one rib 06/23/2020  . Coronary artery disease 06/23/2020  . Dementia with behavioral disturbance ([place]) 06/23/2020  . Dysgeusia 06/23/2020  . Extrapyramidal and movement disorder, unspecified 06/23/2020  . Gallstones 06/23/2020  . Generalized lymphadenopathy 06/23/2020  . Hardening of the aorta (main artery of the heart) ([place]) 06/23/2020  . Hearing loss 06/23/2020  . Herpes simplex viral infection 06/23/2020  . Hypothyroid 06/23/2020  . Insomnia 06/23/2020  . Malignant melanoma of skin ([place]) 06/23/2020  . Mild recurrent major depression ([place]) 06/23/2020  .  Mitral valve disorder 06/23/2020  . Monocytosis 06/23/2020  . Neuropathic pain 06/23/2020  . Prediabetes 06/23/2020  . Renal cyst 06/23/2020  . Senile purpura ([place]) 06/23/2020  . Solitary pulmonary nodule 06/23/2020  . Spinal stenosis in cervical region 06/23/2020  . Bronchiectasis without complication ([place]) 0000000  . Centrilobular emphysema ([place]) 03/25/2020  . Abnormal findings on diagnostic imaging of lung 03/25/2020  . ILD (interstitial lung disease) ([place]) 03/25/2020  . COPD with acute exacerbation ([place]) 02/13/2020  . Abnormal CT of the chest 07/30/2019  . Elevated rheumatoid factor 07/30/2019  . Oral candidiasis 07/30/2019  . Sinus tachycardia 04/03/2019  . Chronic otorrhea of left ear 03/28/2019  . Confusion 01/02/2019  . Bilateral impacted cerumen 01/31/2018  . Abnormal EKG-dynamic TWI 10/31/2017  . PVC's (premature ventricular contractions) 10/31/2017  . Family history of coronary artery disease in father 10/31/2017  . Unstable angina ([place]) 10/28/2017  . Bilateral inguinal hernia 11/04/2016  . Neck pain 10/14/2016  . Mobitz type 1 second degree atrioventricular block 03/16/2016  . Syncope 03/15/2016  . Acute suppurative otitis media of left ear without spontaneous rupture of tympanic membrane 12/24/2015  . Malnutrition of moderate degree 10/21/2015  . Hyperkinetic disorder   . Choreiform movements 10/20/2015  . Bilateral tympanic membrane perforation 09/24/2015  . 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 Weight loss 09/24/2015  . Chronic respiratory failure with hypoxia ([place]) 09/14/2015  . Cough   . Acute respiratory distress 09/04/2015  . Barrett's esophagus 09/04/2015  .  Dyspnea on exertion 09/04/2015  . Influenza   . CAP (community acquired pneumonia) 09/03/2015  . Upper airway cough syndrome 08/21/2015  . Chest pain with moderate risk of acute coronary syndrome 05/13/2015  . Bilateral inguinal hernia (BIH) 01/08/2014  . Ileus ([place])  08/18/2012  . Pain due to Left hip joint prosthesis 06/13/2011  . Hyperparathyroidism, primary, s/p MI parathyroidectomy 7/18, 3.1 gm adenoma 02/19/2011  . LIPOMAS, MULTIPLE 10/24/2007  . HLD (hyperlipidemia) 10/24/2007  . Depression 10/24/2007  . ADD (attention deficit disorder) 10/24/2007  . OTITIS MEDIA 10/24/2007  . SINUSITIS, CHRONIC 10/24/2007  . PNEUMONIA, RECURRENT 10/24/2007  . HIP FRACTURE, LEFT 10/24/2007  . GENITAL HERPES, HX OF 10/24/2007  . TOBACCO USE, QUIT 10/24/2007  . HIP REPLACEMENT, TOTAL, HX OF 10/24/2007  . Other acquired absence of organ 10/24/2007  . G E R D 07/13/2007     Past Surgical History:  Procedure Laterality Date  . APPENDECTOMY  1984  . CATARACT EXTRACTION     L and R eye  . CHOLECYSTECTOMY  2002  . HYDROCELE EXCISION Right 11/04/2016   Procedure: HYDROCELECTOMY ADULT;  Surgeon: Franchot Gallo, MD;  Location: [facility];  Service: Urology;  Laterality: Right;  . INGUINAL HERNIA REPAIR Bilateral 11/04/2016   Procedure: HERNIA REPAIR INGUINAL ADULT BILATERAL;  Surgeon: Jackolyn Confer, MD;  Location: [facility];  Service: General;  Laterality: Bilateral;  . INSERTION OF MESH Bilateral 11/04/2016   Procedure: INSERTION OF MESH;  Surgeon: Jackolyn Confer, MD;  Location: [facility];  Service: General;  Laterality: Bilateral;  . JOINT REPLACEMENT     Lt hip  . LEFT HEART CATH AND CORONARY ANGIOGRAPHY N/A 10/31/2017   Procedure: LEFT HEART CATH AND CORONARY ANGIOGRAPHY;  Surgeon: Troy Sine, MD;  Location: [facility];  Service: Cardiovascular;  Laterality: N/A;  . MASTOIDECTOMY  1996  . NASAL SEPTUM SURGERY     sinus surgery  . PARATHYROIDECTOMY    . TOTAL HIP REVISION  06/14/2011   Procedure: TOTAL HIP REVISION;  Surgeon: Kerin Salen;  Location: [facility];  Service: Orthopedics;  Laterality: Left;  Left Acetabular  Hip Revision     Current Outpatient Medications on File Prior to Visit  Medication Sig Dispense Refill  . celecoxib (CELEBREX) 200 MG  capsule 1 capsule with food as needed    . meclizine (ANTIVERT) 25 MG tablet 1 tablet as needed    . albuterol (PROVENTIL) (2.5 MG/3ML) 0.083% nebulizer solution Take 2.5 mg by nebulization every 4 (four) hours.    Marland Kitchen aspirin 81 MG chewable tablet Chew 81 mg by mouth daily.    . famotidine (PEPCID) 20 MG tablet Take 1 tablet (20 mg total) by mouth at bedtime. 30 tablet 3  . FLUoxetine (PROZAC) 40 MG capsule Take 40 mg by mouth every morning.     . methylphenidate (RITALIN) 10 MG tablet Take 10 mg by mouth 3 (three) times daily.      . metoprolol succinate (TOPROL-XL) 25 MG 24 hr tablet TAKE 1 TABLET(25 MG) BY MOUTH DAILY 90 tablet 3  . mirtazapine (REMERON) 15 MG tablet Take 15 mg by mouth at bedtime.    . Multiple Vitamin (MULTIVITAMIN) tablet Take 1 tablet by mouth daily.    . nitroGLYCERIN (NITROSTAT) 0.4 MG SL tablet Place 0.4 mg under the tongue every 5 (five) minutes as needed for chest pain.     Marland Kitchen omeprazole (PRILOSEC OTC) 20 MG tablet 1 tablet    . omeprazole (PRILOSEC) 20 MG capsule Take  20 mg by mouth 2 (two) times daily before a meal.     . polyethylene glycol (MIRALAX / GLYCOLAX) packet Take 17 g by mouth daily as needed (for constipation.).     Marland Kitchen predniSONE (DELTASONE) 20 MG tablet Take 1 tablet (20 mg total) by mouth daily with breakfast. 4 tablet 0  . revefenacin (YUPELRI) 175 MCG/3ML nebulizer solution Take 3 mLs (175 mcg total) by nebulization daily. 90 mL 12  . rosuvastatin (CRESTOR) 10 MG tablet TAKE 1 TABLET(10 MG) BY MOUTH DAILY 90 tablet 3  . SYNTHROID 25 MCG tablet Take 25 mcg by mouth daily before breakfast.     . tamsulosin (FLOMAX) 0.4 MG CAPS capsule Take 0.4 mg by mouth daily after breakfast.      No current facility-administered medications on file prior to visit.     Allergies  Allergen Reactions  . Bupropion Other (See Comments)    Caused altered mental status Altered mental status Caused altered mental status  . Ciprofloxacin-Dexamethasone Other (See  Comments)    Burning in ears when Cipro ear drops were used unknown Burning in ears when Cipro ear drops were used  . Gabapentin Other (See Comments)    Caused weakness, dizziness, and forgetfulness  Weakness and dizziness with higher doses. Weakness and dizziness Caused weakness, dizziness, and forgetfulness  . Ciprofloxacin Hcl Other (See Comments)    Burning in ears when drops were used  . Clonidine Hcl Other (See Comments)     Social History   Occupational History    Comment: Sales  Tobacco Use  . Smoking status: Former Smoker    Packs/day: 1.50    Years: 24.00    Pack years: 36.00    Types: Cigarettes    Quit date: 07/06/1987    Years since quitting: 33.0  . Smokeless tobacco: Never Used  Vaping Use  . Vaping Use: Never used  Substance and Sexual Activity  . Alcohol use: Yes    Comment: occ  . Drug use: No  . Sexual activity: Not on file     Family History  Problem Relation Age of Onset  . Lung cancer Father 70       smoked  . Heart Problems Father   . Cancer Brother        prostate  . Heart Problems Mother   . Neuropathy Sister   . Healthy Son   . Healthy Daughter   . Healthy Daughter      Immunization History  Administered Date(s) Administered  . Influenza Split 04/05/2015, 03/30/2019  . Influenza, High Dose Seasonal PF 03/30/2016  . PFIZER SARS-COV-2 Vaccination 08/12/2019, 09/05/2019  . Pneumococcal Polysaccharide-23 10/24/2007     Objective: JONHATAN HEARTY is a pleasant 69 y.o. male WD, WN in NAD. AAO x 3.  Vitals:   06/23/20 1621  BP: 120/71  Pulse: (!) 59  Temp: (!) 97.2 F (36.2 C)    Vascular Examination:  Capillary refill time to digits immediate b/l. Palpable pedal pulses b/l LE. Pedal hair sparse. Lower extremity skin temperature gradient within normal limits. No pain with calf compression b/l.  Dermatological Examination: Pedal skin with normal turgor, texture and tone bilaterally. No open wounds bilaterally. No interdigital  macerations bilaterally. Toenails 1-5 b/l elongated, discolored, dystrophic, thickened, crumbly with subungual debris and tenderness to dorsal palpation.  Musculoskeletal: Normal muscle strength 5/5 to all lower extremity muscle groups bilaterally. No pain crepitus or joint limitation noted with ROM b/l.  Neurological: Protective sensation intact 5/5 intact bilaterally with  10g monofilament b/l. Vibratory sensation intact b/l. Clonus negative b/l.  Assessment: 1. Pain due to onychomycosis of toenails of both feet     Plan: -Examined patient. -Patient to continue soft, supportive shoe gear daily. -Discussed topical, laser and oral medication. Patient opted for topical treatment with compounded medication. Rx written for nonformulary compounding topical antifungal: [facility]: Antifungal cream - Terbinafine 3%, Fluconazole 2%, Tea Tree Oil 5%, Urea 10%, Ibuprofen 2% in DMSO Suspension #91ml. Apply to the affected nail(s) at bedtime. -Toenails 1-5 b/l were debrided in length and girth with sterile nail nippers and dremel without iatrogenic bleeding.  -Patient to report any pedal injuries to medical professional immediately. -Patient/POA to call should there be question/concern in the interim.  Return in about 3 months (around 09/21/2020) for nail trim.  Marzetta Board, DPM

## 2020-07-02 ENCOUNTER — Telehealth: Payer: Self-pay | Admitting: Internal Medicine

## 2020-07-07 NOTE — Telephone Encounter (Signed)
An order for HRCT to be performed in 08/2020 is in pt's chart. Forwarding to [facility] for scheduling.  Thanks!

## 2020-07-10 ENCOUNTER — Telehealth: Payer: Self-pay | Admitting: Internal Medicine

## 2020-07-10 NOTE — Telephone Encounter (Signed)
Chest ct 08/15/20@11 :00am @[facility]  pt is aware 

## 2020-07-10 NOTE — Telephone Encounter (Signed)
Primary Pulmonologist: Ramaswamy Last office visit and with whom: 03/25/20  Wyn Quaker NP What do we see them for (pulmonary problems): ILD Last OV assessment/plan: Assessment & Plan Note by Lauraine Rinne, NP at 03/25/2020 4:46 PM  Author: Lauraine Rinne, NP Author Type: Nurse Practitioner Filed: 03/25/2020 4:46 PM  Note Status: Written Cosign: Cosign Not Required Encounter Date: 03/25/2020  Problem: Centrilobular emphysema ([place])  Editor: Lauraine Rinne, NP (Nurse Practitioner)               Plan: Trial of Renelda Mom our office when you are 2 weeks into your Yupelri trial to let us know if you feel that you are benefiting from this 49-month follow-up with [name]       Assessment & Plan Note by Lauraine Rinne, NP at 03/25/2020 4:46 PM  Author: Lauraine Rinne, NP Author Type: Nurse Practitioner Filed: 03/25/2020 4:46 PM  Note Status: Written Cosign: Cosign Not Required Encounter Date: 03/25/2020  Problem: Chronic respiratory failure with hypoxia [facility])  Editor: Lauraine Rinne, NP (Nurse Practitioner)               Plan: Walk in office today, stable no oxygen desaturations       Assessment & Plan Note by Lauraine Rinne, NP at 03/25/2020 4:45 PM  Author: Lauraine Rinne, NP Author Type: Nurse Practitioner Filed: 03/25/2020 4:46 PM  Note Status: Written Cosign: Cosign Not Required Encounter Date: 03/25/2020  Problem: ILD (interstitial lung disease) [facility])  Editor: Lauraine Rinne, NP (Nurse Practitioner)               Indeterminate on high-resolution CT chest Radiology recommending 1 year follow-up  Reviewed pulmonary function testing today with patient  Plan: Obtain 104-month spirometry with [facility] Obtain repeat high-resolution CT chest in February/2022 50-month follow-up with [name]       Patient Instructions by Lauraine Rinne, NP at 03/25/2020 3:30 PM  Author: Lauraine Rinne, NP Author Type: Nurse Practitioner Filed: 03/25/2020 3:52 PM  Note Status: Addendum Cosign:  Cosign Not Required Encounter Date: 03/25/2020  Editor: Lauraine Rinne, NP (Nurse Practitioner)      Prior Versions: 1. Lauraine Rinne, NP (Nurse Practitioner) at 03/25/2020 3:52 PM - Addendum   2. Lauraine Rinne, NP (Nurse Practitioner) at 03/25/2020 3:51 PM - Signed    You were seen today by Lauraine Rinne, NP  for:   1. ILD (interstitial lung disease) ([place])  - CT Chest High Resolution; Future - Pulmonary function test; Future  Walk today in office   2. Chronic respiratory failure with hypoxia (HCC)  Continue oxygen therapy as prescribed  >>>maintain oxygen saturations greater than 88 percent  >>>if unable to maintain oxygen saturations please contact the office  >>>do not smoke with oxygen  >>>can use nasal saline gel or nasal saline rinses to moisturize nose if oxygen causes dryness  3. Bronchiectasis without complication (HCC)  Bronchiectasis: This is the medical term which indicates that you have damage, dilated airways making you more susceptible to respiratory infection. Use a flutter valve 10 breaths twice a day or 4 to 5 breaths 4-5 times a day to help clear mucus out Let us know if you have cough with change in mucus color or fevers or chills.  At that point you would need an antibiotic. Maintain a healthy nutritious diet, eating whole foods Take your medications as prescribed    4. Centrilobular emphysema ([place])  Trial of Maretta Bees  nebulized meds daily  Notify our office if you feel like this is helping your breathing after completing 2 weeks of treatment  Note your daily symptoms >remember "red flags" for COPD:  >>>Increase in cough >>>increase in sputum production >>>increase in shortness of breath or activity  intolerance.   If you notice these symptoms, please call the office to be seen.   5. Abnormal findings on diagnostic imaging of lung  Obtain high-resolution CT chest imaging in February/2022  We recommend today:        Orders Placed  This Encounter  Procedures  . CT Chest High Resolution    -High-res CT with supine and prone positioning -inspiratory and expiratory cuts.  -Only to be read by Dr. Rosario Jacks and Dr. Weber Cooks.    Standing Status:   Future    Standing Expiration Date:   03/25/2021    Scheduling Instructions:     Feb/2022    Order Specific Question:   Preferred imaging location?    Answer:   [place]    Order Specific Question:   Radiology Contrast Protocol - do NOT remove file path    Answer:   [email or web address]  . Pulmonary function test    Arlyce Harman with [facility]    Standing Status:   Future    Standing Expiration Date:   03/25/2021    Scheduling Instructions:     March/2022    Order Specific Question:   Where should this test be performed?    Answer:   [facility]      Orders Placed This Encounter  Procedures  . CT Chest High Resolution  . Pulmonary function test   No orders of the defined types were placed in this encounter.   Follow Up:    Return in about 3 months (around 06/24/2020), or if symptoms worsen or fail to improve, for Follow up with Dr. Purnell Shoemaker, ILD clinic - 79min slot.   Notification of test results are managed in the following manner: If there are  any recommendations or changes to the  plan of care discussed in office today,  we will contact you and let you know what they are. If you do not hear from [place], then your results are normal and you can view them through your  MyChart account , or a letter will be sent to you. Thank you again for trusting [place] with your care  - Thank you, [facility]    It is flu season:   >>> Best ways to protect herself from the flu: Receive the yearly flu vaccine, practice good hand hygiene washing with soap and also using hand sanitizer when available, eat a nutritious meals, get adequate rest, hydrate appropriately       Please  contact the office if your symptoms worsen or you have concerns that you are not improving.   Thank you for choosing [facility] for your healthcare, and for allowing [place] to partner with you on your healthcare journey. I am thankful to be able to provide care to you today.   Wyn Quaker FNP-C         Instructions     Return in about 3 months (around 06/24/2020), or if symptoms worsen or fail to improve, for Follow up with Dr. Purnell Shoemaker, ILD clinic - 39min slot.  You were seen today by Lauraine Rinne, NP  for:   1. ILD (interstitial lung disease) ([place])  - CT Chest High Resolution; Future - Pulmonary function test;  Future  Walk today in office   2. Chronic respiratory failure with hypoxia (HCC)  Continue oxygen therapy as prescribed  >>>maintain oxygen saturations greater than 88 percent  >>>if unable to maintain oxygen saturations please contact the office  >>>do not smoke with oxygen  >>>can use nasal saline gel or nasal saline rinses to moisturize nose if oxygen causes dryness  3. Bronchiectasis without complication (HCC)  Bronchiectasis: This is the medical term which indicates that you have damage, dilated airways making you more susceptible to respiratory infection. Use a flutter valve 10 breaths twice a day or 4 to 5 breaths 4-5 times a day to help clear mucus out Let us know if you have cough with change in mucus color or fevers or chills.  At that point you would need an antibiotic. Maintain a healthy nutritious diet, eating whole foods Take your medications as prescribed    4. Centrilobular emphysema (HCC)  Trial of Yupelri nebulized meds daily  Notify our office if you feel like this is helping your breathing after completing 2 weeks of treatment  Note your daily symptoms >remember "red flags" for COPD:  >>>Increase in cough >>>increase in sputum production >>>increase in shortness of breath or activity  intolerance.   If you  notice these symptoms, please call the office to be seen.   5. Abnormal findings on diagnostic imaging of lung  Obtain high-resolution CT chest imaging in February/2022  We recommend today:  Orders Placed This Encounter  Procedures  . CT Chest High Resolution    -High-res CT with supine and prone positioning -inspiratory and expiratory cuts.  -Only to be read by Dr. Fredirick Lathe and Dr. Llana Aliment.    Standing Status:   Future    Standing Expiration Date:   03/25/2021    Scheduling Instructions:     Feb/2022    Order Specific Question:   Preferred imaging location?    Answer:   [facility]    Order Specific Question:   Radiology Contrast Protocol - do NOT remove file path    Answer:   [email or web address]  . Pulmonary function test    Cleda Daub with [facility]    Standing Status:   Future    Standing Expiration Date:   03/25/2021    Scheduling Instructions:     March/2022    Order Specific Question:   Where should this test be performed?    Answer:   [facility]      Orders Placed This Encounter  Procedures  . CT Chest High Resolution  . Pulmonary function test   No orders of the defined types were placed in this encounter.   Follow Up:    Return in about 3 months (around 06/24/2020), or if symptoms worsen or fail to improve, for Follow up with Dr. Dalbert Mayotte, ILD clinic - slot.   Notification of test results are managed in the following manner: If there are  any recommendations or changes to the  plan of care discussed in office today,  we will contact you and let you know what they are. If you do not hear from [place], then your results are normal and you can view them through your  MyChart account , or a letter will be sent to you. Thank you again for trusting [place] with your care  - Thank you, [facility]    It is flu season:   >>> Best ways to protect herself from the flu:  Receive the yearly flu vaccine,  practice good hand hygiene washing with soap and also using hand sanitizer when available, eat a nutritious meals, get adequate rest, hydrate appropriately       Please contact the office if your symptoms worsen or you have concerns that you are not improving.   Thank you for choosing [facility] for your healthcare, and for allowing [place] to partner with you on your healthcare journey. I am thankful to be able to provide care to you today.   Wyn Quaker FNP-C       Reason for call: Thinks he has Bronchitis, cough, some chest pain and some clear sputum.  Symptoms for 1 day, woke up this am early with weird breathing patterns (possibly wheezing, high pitched sound, not sob)  Coughing.  Chest pain is dull in both lung on sides, when he coughs mostly, some when breathing. Denies fever, chills or body aches.  Has been babysitting granddaughter.   Granddaughter has a cold, fever, cough.  Has has had all covid vaccines and flu vaccines.  Granddaughter is not currently on an antibiotics, just taking tylenol for fever.  Patient's wife advised that insurance is no longer paying for Albuterol, they will only cover Ventolin, please advise if ok to send to pharmacy as patient needs refill.  [name], please advise.  Thank you.  (examples of things to ask: : When did symptoms start? Fever? Cough? Productive? Color to sputum? More sputum than usual? Wheezing? Have you needed increased oxygen? Are you taking your respiratory medications? What over the counter measures have you tried?)  Allergies  Allergen Reactions  . Bupropion Other (See Comments)    Caused altered mental status Altered mental status Caused altered mental status  . Ciprofloxacin-Dexamethasone Other (See Comments)    Burning in ears when Cipro ear drops were used unknown Burning in ears when Cipro ear drops were used  . Gabapentin Other (See Comments)    Caused weakness, dizziness,  and forgetfulness  Weakness and dizziness with higher doses. Weakness and dizziness Caused weakness, dizziness, and forgetfulness  . Ciprofloxacin Hcl Other (See Comments)    Burning in ears when drops were used  . Clonidine Hcl Other (See Comments)    Immunization History  Administered Date(s) Administered  . Influenza Split 04/05/2015, 03/30/2019  . Influenza, High Dose Seasonal PF 03/30/2016  . PFIZER SARS-COV-2 Vaccination 08/12/2019, 09/05/2019  . Pneumococcal Polysaccharide-23 10/24/2007

## 2020-07-11 MED ORDER — DOXYCYCLINE HYCLATE 100 MG PO TABS: 100.0000 mg | ORAL_TABLET | Freq: Two times a day (BID) | ORAL | 0 refills | Status: DC

## 2020-07-11 MED ORDER — PREDNISONE 10 MG PO TABS: ORAL_TABLET | ORAL | 0 refills | Status: DC

## 2020-07-11 NOTE — Telephone Encounter (Signed)
Guys this is  A sick call. I am still on PAL. Please send other sick calls to provider of day. In any event  -s given extensive OMICROn - he should get PCR tested regardless of vaccination status. 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   reports that he quit smoking about 33 years ago. His smoking use included cigarettes. He has a 36.00 pack-year smoking history. He has never used smokeless tobacco.  Past Surgical History:  Procedure Laterality Date  . APPENDECTOMY  1984  . CATARACT EXTRACTION     L and R eye  . CHOLECYSTECTOMY  2002  . HYDROCELE EXCISION Right 11/04/2016   Procedure: HYDROCELECTOMY ADULT;  Surgeon: Franchot Gallo, MD;  Location: [facility];  Service: Urology;  Laterality: Right;  . INGUINAL HERNIA REPAIR Bilateral 11/04/2016   Procedure: HERNIA REPAIR INGUINAL ADULT BILATERAL;  Surgeon: Jackolyn Confer, MD;  Location: [facility];  Service: General;  Laterality: Bilateral;  . INSERTION OF MESH Bilateral 11/04/2016   Procedure: INSERTION OF MESH;  Surgeon: Jackolyn Confer, MD;  Location: [facility];  Service: General;  Laterality: Bilateral;  . JOINT REPLACEMENT     Lt hip  . LEFT HEART CATH AND CORONARY ANGIOGRAPHY N/A 10/31/2017    Procedure: LEFT HEART CATH AND CORONARY ANGIOGRAPHY;  Surgeon: Troy Sine, MD;  Location: [facility];  Service: Cardiovascular;  Laterality: N/A;  . MASTOIDECTOMY  1996  . NASAL SEPTUM SURGERY     sinus surgery  . PARATHYROIDECTOMY    . TOTAL HIP REVISION  06/14/2011   Procedure: TOTAL HIP REVISION;  Surgeon: Kerin Salen;  Location: [facility];  Service: Orthopedics;  Laterality: Left;  Left Acetabular  Hip Revision    Allergies  Allergen Reactions  . Bupropion Other (See Comments)    Caused altered mental status Altered mental status Caused altered mental status  . Ciprofloxacin-Dexamethasone Other (See Comments)    Burning in ears when Cipro ear drops were used unknown Burning in ears when Cipro ear drops were used  . Gabapentin Other (See Comments)    Caused weakness, dizziness, and forgetfulness  Weakness and dizziness with higher doses. Weakness and dizziness Caused weakness, dizziness, and forgetfulness  . Ciprofloxacin Hcl Other (See Comments)    Burning in ears when drops were used  . Clonidine Hcl Other (See Comments)    Immunization History  Administered Date(s) Administered  . Influenza Split 04/05/2015, 03/30/2019  . Influenza, High Dose Seasonal PF 03/30/2016  . PFIZER SARS-COV-2 Vaccination 08/12/2019, 09/05/2019  . Pneumococcal Polysaccharide-23 10/24/2007    Family History  Problem Relation Age of Onset  . Lung cancer Father 85       smoked  .  Heart Problems Father   . Cancer Brother        prostate  . Heart Problems Mother   . Neuropathy Sister   . Healthy Son   . Healthy Daughter   . Healthy Daughter      Current Outpatient Medications:  .  albuterol (PROVENTIL) (2.5 MG/3ML) 0.083% nebulizer solution, Take 2.5 mg by nebulization every 4 (four) hours., Disp: , Rfl:  .  aspirin 81 MG chewable tablet, Chew 81 mg by mouth daily., Disp: , Rfl:  .  celecoxib (CELEBREX) 200 MG capsule, 1 capsule with food as needed, Disp: , Rfl:  .   famotidine (PEPCID) 20 MG tablet, Take 1 tablet (20 mg total) by mouth at bedtime., Disp: 30 tablet, Rfl: 3 .  FLUoxetine (PROZAC) 40 MG capsule, Take 40 mg by mouth every morning. , Disp: , Rfl:  .  meclizine (ANTIVERT) 25 MG tablet, 1 tablet as needed, Disp: , Rfl:  .  methylphenidate (RITALIN) 10 MG tablet, Take 10 mg by mouth 3 (three) times daily.  , Disp: , Rfl:  .  metoprolol succinate (TOPROL-XL) 25 MG 24 hr tablet, TAKE 1 TABLET(25 MG) BY MOUTH DAILY, Disp: 90 tablet, Rfl: 3 .  mirtazapine (REMERON) 15 MG tablet, Take 15 mg by mouth at bedtime., Disp: , Rfl:  .  Multiple Vitamin (MULTIVITAMIN) tablet, Take 1 tablet by mouth daily., Disp: , Rfl:  .  nitroGLYCERIN (NITROSTAT) 0.4 MG SL tablet, Place 0.4 mg under the tongue every 5 (five) minutes as needed for chest pain. , Disp: , Rfl:  .  NONFORMULARY OR COMPOUNDED ITEM, Antifungal solution: Terbinafine 3%, Fluconazole 2%, Tea Tree Oil 5%, Urea 10%, Ibuprofen 2% in DMSO suspension #27mL, Disp: 1 each, Rfl: 3 .  omeprazole (PRILOSEC OTC) 20 MG tablet, 1 tablet, Disp: , Rfl:  .  omeprazole (PRILOSEC) 20 MG capsule, Take 20 mg by mouth 2 (two) times daily before a meal. , Disp: , Rfl:  .  polyethylene glycol (MIRALAX / GLYCOLAX) packet, Take 17 g by mouth daily as needed (for constipation.). , Disp: , Rfl:  .  predniSONE (DELTASONE) 20 MG tablet, Take 1 tablet (20 mg total) by mouth daily with breakfast., Disp: 4 tablet, Rfl: 0 .  revefenacin (YUPELRI) 175 MCG/3ML nebulizer solution, Take 3 mLs (175 mcg total) by nebulization daily., Disp: 90 mL, Rfl: 12 .  rosuvastatin (CRESTOR) 10 MG tablet, TAKE 1 TABLET(10 MG) BY MOUTH DAILY, Disp: 90 tablet, Rfl: 3 .  SYNTHROID 25 MCG tablet, Take 25 mcg by mouth daily before breakfast. , Disp: , Rfl:  .  tamsulosin (FLOMAX) 0.4 MG CAPS capsule, Take 0.4 mg by mouth daily after breakfast. , Disp: , Rfl:

## 2020-07-11 NOTE — Telephone Encounter (Signed)
Spoke to patient and relayed below recommendations. He voiced his understanding and had no further questions.  Patient will go to local testing site today to be tested. He will call back with test results. Rx for prednisone and doxy has been sent to preferred pharmacy. Nothing further needed.

## 2020-07-14 ENCOUNTER — Telehealth: Payer: Self-pay | Admitting: Internal Medicine

## 2020-07-14 NOTE — Telephone Encounter (Signed)
This is not good sign. Need to know covid results. Can he tell us what his puilse ox is at rest and walking inside room?

## 2020-07-14 NOTE — Telephone Encounter (Signed)
Spoke with the pt  He is taking doxy and pred taper like we advised since he called on 07/10/20  He states that his cough has been worse for the past 2 days, and he is now wheezing and this is something new  He states that his SOB is the same since he called 1/6  He is not having any f/c/s, aches  He does have some PND and spouse states "chest sounds like it's rattling" Pt went for covid test 1/8 and states should know results by tomorrow 1/11 Please advise if you have any additional recs for him pending his covid test

## 2020-07-14 NOTE — Telephone Encounter (Signed)
Per Ander Purpura- Patient can not come in office with covid symptoms and pending covid test.   Respiratory (Covid) Clinic opens today at [place] and [place].  Provider can send message to clinic requesting Patient be seen.   Message routed to Dr. Michaelyn Barter

## 2020-07-14 NOTE — Telephone Encounter (Signed)
Ok I spoke to [facility]. Please tell patient his choices are  1)he is to  call or patienor patient can call [phone] the resp cl;[facility] and get advice for suspected covid  2) wait till results come tomorrow. If negative, he can come and be seen by APP 07/15/20 PM  3) if worse or feels bad go to ER anytime

## 2020-07-14 NOTE — Telephone Encounter (Signed)
Spoke with the pt  He states that this o2 sats are 96-97% RA  He walked the length of his apartment and sats were 93-95% RA

## 2020-07-14 NOTE — Telephone Encounter (Signed)
I called and spoke with the pt and notified of response per MR  He verbalized understanding   Will call for appt depending on results and seek emergent care if symptoms worsen

## 2020-07-14 NOTE — Telephone Encounter (Signed)
He needs to be assessed face to face. Hard on phone. Pulse ox is reassuring . He had normal echo May 2021  Plan - can he do some basic blood work - cbc, bmet, trop, bnp and d-dimer 07/14/2020 =- do CXR 07/14/2020   - if he can come in today with covid still pending then he should  - Anything he can do without covid results he should 07/14/2020   - pthewrise come in 07/15/20 PM to office to see APP   - if worse go to ER

## 2020-07-16 ENCOUNTER — Telehealth: Payer: Self-pay | Admitting: Internal Medicine

## 2020-07-16 NOTE — Telephone Encounter (Signed)
Spoke with patient. He stated that he went to the [facility] on 07/12/20 to be tested and has not heard back about his results yet. He has tried several times to call and get his results but no one will return his call. He wants to try and get the results for him. He has given a verbal for [place] to contact the [facility] on his behalf. I did advise him that most places will need a signed consent but we will try over the phone.   He verbalized understanding.   [facility] [phone] and was on hold for over 10 mins. Will try to call back tomorrow.

## 2020-07-18 ENCOUNTER — Ambulatory Visit (INDEPENDENT_AMBULATORY_CARE_PROVIDER_SITE_OTHER): Payer: Medicare Other

## 2020-07-18 ENCOUNTER — Emergency Department (HOSPITAL_COMMUNITY): Payer: Medicare Other

## 2020-07-18 ENCOUNTER — Telehealth: Payer: Self-pay | Admitting: Internal Medicine

## 2020-07-18 ENCOUNTER — Encounter (HOSPITAL_COMMUNITY): Payer: Self-pay

## 2020-07-18 ENCOUNTER — Other Ambulatory Visit: Payer: Self-pay

## 2020-07-18 ENCOUNTER — Emergency Department (HOSPITAL_COMMUNITY)
Admission: EM | Admit: 2020-07-18 | Discharge: 2020-07-18 | Disposition: A | Discharge status: Home / Self Care | Payer: Medicare Other | Attending: Emergency Medicine | Admitting: Emergency Medicine

## 2020-07-18 ENCOUNTER — Other Ambulatory Visit (INDEPENDENT_AMBULATORY_CARE_PROVIDER_SITE_OTHER): Payer: Medicare Other

## 2020-07-18 ENCOUNTER — Ambulatory Visit: Payer: Medicare Other

## 2020-07-18 DIAGNOSIS — R911 Solitary pulmonary nodule: Secondary | ICD-10-CM | POA: Diagnosis not present

## 2020-07-18 DIAGNOSIS — E039 Hypothyroidism, unspecified: Secondary | ICD-10-CM | POA: Diagnosis not present

## 2020-07-18 DIAGNOSIS — Z7951 Long term (current) use of inhaled steroids: Secondary | ICD-10-CM | POA: Diagnosis not present

## 2020-07-18 DIAGNOSIS — R0789 Other chest pain: Secondary | ICD-10-CM | POA: Diagnosis not present

## 2020-07-18 DIAGNOSIS — Z87891 Personal history of nicotine dependence: Secondary | ICD-10-CM | POA: Diagnosis not present

## 2020-07-18 DIAGNOSIS — R059 Cough, unspecified: Secondary | ICD-10-CM | POA: Diagnosis not present

## 2020-07-18 DIAGNOSIS — Z96642 Presence of left artificial hip joint: Secondary | ICD-10-CM | POA: Insufficient documentation

## 2020-07-18 DIAGNOSIS — R0602 Shortness of breath: Secondary | ICD-10-CM

## 2020-07-18 DIAGNOSIS — J449 Chronic obstructive pulmonary disease, unspecified: Secondary | ICD-10-CM | POA: Insufficient documentation

## 2020-07-18 DIAGNOSIS — Z85828 Personal history of other malignant neoplasm of skin: Secondary | ICD-10-CM | POA: Insufficient documentation

## 2020-07-18 DIAGNOSIS — Z79899 Other long term (current) drug therapy: Secondary | ICD-10-CM | POA: Diagnosis not present

## 2020-07-18 DIAGNOSIS — Z7982 Long term (current) use of aspirin: Secondary | ICD-10-CM | POA: Insufficient documentation

## 2020-07-18 DIAGNOSIS — K449 Diaphragmatic hernia without obstruction or gangrene: Secondary | ICD-10-CM | POA: Diagnosis not present

## 2020-07-18 DIAGNOSIS — J40 Bronchitis, not specified as acute or chronic: Secondary | ICD-10-CM | POA: Insufficient documentation

## 2020-07-18 DIAGNOSIS — I2510 Atherosclerotic heart disease of native coronary artery without angina pectoris: Secondary | ICD-10-CM | POA: Diagnosis not present

## 2020-07-18 DIAGNOSIS — R062 Wheezing: Secondary | ICD-10-CM | POA: Diagnosis not present

## 2020-07-18 DIAGNOSIS — F0391 Unspecified dementia with behavioral disturbance: Secondary | ICD-10-CM | POA: Insufficient documentation

## 2020-07-18 LAB — BASIC METABOLIC PANEL
BUN: 22 mg/dL (ref 6–23)
CO2: 29 mEq/L (ref 19–32)
Calcium: 9 mg/dL (ref 8.4–10.5)
Chloride: 104 mEq/L (ref 96–112)
Creatinine, Ser: 1.03 mg/dL (ref 0.40–1.50)
GFR: 73.73 mL/min (ref >60.00)
Glucose, Bld: 90 mg/dL (ref 70–99)
Potassium: 4.2 mEq/L (ref 3.5–5.1)
Sodium: 142 mEq/L (ref 135–145)

## 2020-07-18 LAB — TROPONIN I (HIGH SENSITIVITY): High Sens Troponin I: 6 ng/L (ref 2–17)

## 2020-07-18 LAB — CBC WITH DIFFERENTIAL/PLATELET
Basophils Absolute: 0 10*3/uL (ref 0.0–0.1)
Basophils Relative: 0.7 % (ref 0.0–3.0)
Eosinophils Absolute: 0.2 10*3/uL (ref 0.0–0.7)
Eosinophils Relative: 3.5 % (ref 0.0–5.0)
HCT: 43.3 % (ref 39.0–52.0)
Hemoglobin: 15 g/dL (ref 13.0–17.0)
Lymphocytes Relative: 28.1 % (ref 12.0–46.0)
Lymphs Abs: 1.8 10*3/uL (ref 0.7–4.0)
MCHC: 34.6 g/dL (ref 30.0–36.0)
MCV: 92.5 fl (ref 78.0–100.0)
Monocytes Absolute: 0.6 10*3/uL (ref 0.1–1.0)
Monocytes Relative: 9.1 % (ref 3.0–12.0)
Neutro Abs: 3.7 10*3/uL (ref 1.4–7.7)
Neutrophils Relative %: 58.6 % (ref 43.0–77.0)
Platelets: 174 10*3/uL (ref 150.0–400.0)
RBC: 4.68 Mil/uL (ref 4.22–5.81)
RDW: 13.2 % (ref 11.5–15.5)
WBC: 6.4 10*3/uL (ref 4.0–10.5)

## 2020-07-18 LAB — D-DIMER, QUANTITATIVE: D-Dimer, Quant: 0.7 mcg/mL FEU — ABNORMAL HIGH (ref <0.50)

## 2020-07-18 LAB — HEPATIC FUNCTION PANEL
ALT: 29 U/L (ref 0–53)
AST: 24 U/L (ref 0–37)
Albumin: 4.1 g/dL (ref 3.5–5.2)
Alkaline Phosphatase: 77 U/L (ref 39–117)
Bilirubin, Direct: 0.1 mg/dL (ref 0.0–0.3)
Total Bilirubin: 0.5 mg/dL (ref 0.2–1.2)
Total Protein: 6 g/dL (ref 6.0–8.3)

## 2020-07-18 LAB — MAGNESIUM: Magnesium: 2.2 mg/dL (ref 1.5–2.5)

## 2020-07-18 LAB — BRAIN NATRIURETIC PEPTIDE: Pro B Natriuretic peptide (BNP): 57 pg/mL (ref 0.0–100.0)

## 2020-07-18 LAB — PHOSPHORUS: Phosphorus: 4 mg/dL (ref 2.3–4.6)

## 2020-07-18 MED ORDER — IOHEXOL 350 MG/ML SOLN
65.0000 mL | Freq: Once | INTRAVENOUS | Status: AC | PRN
  Administered 2020-07-18: 65 mL via INTRAVENOUS

## 2020-07-18 MED ORDER — BENZONATATE 100 MG PO CAPS: 100.0000 mg | ORAL_CAPSULE | Freq: Three times a day (TID) | ORAL | 0 refills | Status: DC

## 2020-07-18 NOTE — Telephone Encounter (Signed)
  His cxr is showing some new abnormalitiy - that is nodular - ? Pneumonia  >/? rheumatodi nodule  but wbc is normal,, kidney and liver are normal  Only other abnormal lab is his d-dimer elevation  Plan  - needs CT angio chest - rule out PE -> given PE consideration - ER is best option but this means long wait. If he is feeling bad tht is what he has to do.   If he wants to wait but monitor his situation - order CTA for Monday 07/21/20 (or earlies avail as opd) but that is taking a risk and risk is on him  Not sure abx/pred will help because situation is puzzling

## 2020-07-18 NOTE — Telephone Encounter (Signed)
Called and spoke with pt letting him know the results and recommendations stated by MR. Stated to pt that MR said his best option is to go to the ED given PE consideration. Pt verbalized understanding and stated he would go to the ED. Nothing further needed.

## 2020-07-18 NOTE — Telephone Encounter (Signed)
Called and spoke with pt letting him know the info stated by MR. Pt stated he will call wife so she can come pick him up and bring him to office for both labs and xray. All orders have been placed.  Routing back to MR so he can keep eye out for results.

## 2020-07-18 NOTE — Discharge Instructions (Signed)
The CT scan of your lungs showed that you had a small calcified nodule, this does not appear to be cancerous but please let your doctor know about this so that they can follow it up.  It also showed that you had a small hernia of some fat that was going into your chest, this is a very small amount and is not causing your symptoms.  Thankfully there was no blood clot or pneumonia seen on the x-rays.  Please take the Tessalon, 1 dose every 8 hours as needed for coughing, this can be taken safely with other over-the-counter cough medications.  Please return to the ER for severe worsening symptoms otherwise follow-up with your doctor in the next several days if symptoms are ongoing

## 2020-07-18 NOTE — Telephone Encounter (Signed)
Pt did just come to office for both labs and cxr. Routing to MR as an FYI to keep eye out for results.

## 2020-07-18 NOTE — ED Provider Notes (Signed)
[facility] EMERGENCY DEPARTMENT Provider Note   CSN: [id number] Arrival date & time: 07/18/20  1724     History Chief Complaint  Patient presents with  . Shortness of Breath    [name] is a 70 y.o. male.  HPI   This patient is a 70 year old male, he has a known history of recent bronchitis stating that he has been coughing for approximately 10 days.  He does not have any systemic symptoms and in fact has undergone COVID testing which was negative.  When his family doctor performed some testing today including a D-dimer and lab work they referred him to the hospital for a pulmonary embolism evaluation due to his ongoing coughing and chest discomfort.  His symptoms are persistent, and at this time they are not bothering him very much.  He does not have any fevers or swelling of the legs.  He has been taking over-the-counter medications with minimal improvement  Past Medical History:  Diagnosis Date  . Arthritis   . Bronchitis    history of  . 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 GERD (gastroesophageal reflux disease)   . Hiatal hernia   . 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 History of echocardiogram    Echo 11/2019: EF 65-70, no R WMA, mild concentric LVH, normal RV SF, normal PASP (RVSP 19.2 mmHg), trivial MR  . Hypercalcemia   . Lipoma    left forearm (3)  . Malignant melanoma of skin ([place]) 06/23/2020  . Mitral valve prolapse    does not see cardiologist for. last stress test 2003  . Pneumonia 2009  . Tumor cells, benign    lung, left side  . Unstable angina ([place]) 10/28/2017    Patient Active Problem List   Diagnosis Date Noted  . Acquired deformity of neck 06/23/2020  . Allergic rhinitis 06/23/2020  . Amnesia 06/23/2020  . Cataract 06/23/2020  . Cerebrovascular disease 06/23/2020  . Closed fracture of one rib 06/23/2020  . Coronary artery disease 06/23/2020  . Dementia with behavioral disturbance ([place]) 06/23/2020  . Dysgeusia  06/23/2020  . Extrapyramidal and movement disorder, unspecified 06/23/2020  . Gallstones 06/23/2020  . Generalized lymphadenopathy 06/23/2020  . Hardening of the aorta (main artery of the heart) ([place]) 06/23/2020  . Hearing loss 06/23/2020  . Herpes simplex viral infection 06/23/2020  . Hypothyroid 06/23/2020  . Insomnia 06/23/2020  . Malignant melanoma of skin ([place]) 06/23/2020  . Mild recurrent major depression ([place]) 06/23/2020  . Mitral valve disorder 06/23/2020  . Monocytosis 06/23/2020  . Neuropathic pain 06/23/2020  . Prediabetes 06/23/2020  . Renal cyst 06/23/2020  . Senile purpura ([place]) 06/23/2020  . Solitary pulmonary nodule 06/23/2020  . Spinal stenosis in cervical region 06/23/2020  . Bronchiectasis without complication ([place]) 0000000  . Centrilobular emphysema ([place]) 03/25/2020  . Abnormal findings on diagnostic imaging of lung 03/25/2020  . ILD (interstitial lung disease) ([place]) 03/25/2020  . COPD with acute exacerbation ([place]) 02/13/2020  . Abnormal CT of the chest 07/30/2019  . Elevated rheumatoid factor 07/30/2019  . Oral candidiasis 07/30/2019  . Sinus tachycardia 04/03/2019  . Chronic otorrhea of left ear 03/28/2019  . Confusion 01/02/2019  . Bilateral impacted cerumen 01/31/2018  . Abnormal EKG-dynamic TWI 10/31/2017  . PVC's (premature ventricular contractions) 10/31/2017  . Family history of coronary artery disease in father 10/31/2017  . Unstable angina ([place]) 10/28/2017  . Bilateral inguinal hernia 11/04/2016  . Neck pain 10/14/2016  . 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 Syncope 03/15/2016  . Acute suppurative otitis media of left ear without spontaneous rupture of tympanic membrane 12/24/2015  . Malnutrition of moderate degree 10/21/2015  . Hyperkinetic disorder   . Choreiform movements 10/20/2015  . Bilateral tympanic membrane perforation 09/24/2015  . 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 Weight  loss 09/24/2015  . Chronic respiratory failure with hypoxia ([place]) 09/14/2015  . Cough   . Acute respiratory distress 09/04/2015  . Barrett's esophagus 09/04/2015  . Dyspnea on exertion 09/04/2015  . Influenza   . CAP (community acquired pneumonia) 09/03/2015  . Upper airway cough syndrome 08/21/2015  . Chest pain with moderate risk of acute coronary syndrome 05/13/2015  . Bilateral inguinal hernia (BIH) 01/08/2014  . Ileus ([place]) 08/18/2012  . Pain due to Left hip joint prosthesis 06/13/2011  . Hyperparathyroidism, primary, s/p MI parathyroidectomy 7/18, 3.1 gm adenoma 02/19/2011  . LIPOMAS, MULTIPLE 10/24/2007  . HLD (hyperlipidemia) 10/24/2007  . Depression 10/24/2007  . ADD (attention deficit disorder) 10/24/2007  . OTITIS MEDIA 10/24/2007  . SINUSITIS, CHRONIC 10/24/2007  . PNEUMONIA, RECURRENT 10/24/2007  . HIP FRACTURE, LEFT 10/24/2007  . GENITAL HERPES, HX OF 10/24/2007  . TOBACCO USE, QUIT 10/24/2007  . HIP REPLACEMENT, TOTAL, HX OF 10/24/2007  . Other acquired absence of organ 10/24/2007  . G E R D 07/13/2007    Past Surgical History:  Procedure Laterality Date  . APPENDECTOMY  1984  . CATARACT EXTRACTION     L and R eye  . CHOLECYSTECTOMY  2002  . HYDROCELE EXCISION Right 11/04/2016   Procedure: HYDROCELECTOMY ADULT;  Surgeon: Franchot Gallo, MD;  Location: [facility];  Service: Urology;  Laterality: Right;  . INGUINAL HERNIA REPAIR Bilateral 11/04/2016   Procedure: HERNIA REPAIR INGUINAL ADULT BILATERAL;  Surgeon: Jackolyn Confer, MD;  Location: [facility];  Service: General;  Laterality: Bilateral;  . INSERTION OF MESH Bilateral 11/04/2016   Procedure: INSERTION OF MESH;  Surgeon: Jackolyn Confer, MD;  Location: [facility];  Service: General;  Laterality: Bilateral;  . JOINT REPLACEMENT     Lt hip  . LEFT HEART CATH AND CORONARY ANGIOGRAPHY N/A 10/31/2017   Procedure: LEFT HEART CATH AND CORONARY ANGIOGRAPHY;  Surgeon: Troy Sine, MD;  Location: [facility];   Service: Cardiovascular;  Laterality: N/A;  . MASTOIDECTOMY  1996  . NASAL SEPTUM SURGERY     sinus surgery  . PARATHYROIDECTOMY    . TOTAL HIP REVISION  06/14/2011   Procedure: TOTAL HIP REVISION;  Surgeon: Kerin Salen;  Location: [facility];  Service: Orthopedics;  Laterality: Left;  Left Acetabular  Hip Revision       Family History  Problem Relation Age of Onset  . Lung cancer Father 49       smoked  . Heart Problems Father   . Cancer Brother        prostate  . Heart Problems Mother   . Neuropathy Sister   . Healthy Son   . Healthy Daughter   . Healthy Daughter     Social History   Tobacco Use  . Smoking status: Former Smoker    Packs/day: 1.50    Years: 24.00    Pack years: 36.00    Types: Cigarettes    Quit date: 07/06/1987    Years since quitting: 33.0  . Smokeless tobacco: Never Used  Vaping Use  . Vaping Use: Never used  Substance Use Topics  . Alcohol use: Yes  Comment: occ  . Drug use: No    Home Medications Prior to Admission medications   Medication Sig Start Date End Date Taking? Authorizing Provider  benzonatate (TESSALON) 100 MG capsule Take 1 capsule (100 mg total) by mouth every 8 (eight) hours. 07/18/20  Yes Noemi Chapel, MD  albuterol (PROVENTIL) (2.5 MG/3ML) 0.083% nebulizer solution Take 2.5 mg by nebulization every 4 (four) hours. 04/21/20   [provider]  aspirin 81 MG chewable tablet Chew 81 mg by mouth daily.    [provider]  celecoxib (CELEBREX) 200 MG capsule 1 capsule with food as needed 03/04/20   [provider]  famotidine (PEPCID) 20 MG tablet Take 1 tablet (20 mg total) by mouth at bedtime. 09/09/15   Reyne Dumas, MD  FLUoxetine (PROZAC) 40 MG capsule Take 40 mg by mouth every morning.     [provider]  meclizine (ANTIVERT) 25 MG tablet 1 tablet as needed 06/04/20   [provider]  methylphenidate (RITALIN) 10 MG tablet Take 10 mg by mouth 3 (three) times daily.      [provider]  metoprolol succinate (TOPROL-XL) 25 MG 24 hr tablet TAKE 1 TABLET(25 MG) BY MOUTH DAILY 02/26/20   Nahser, Wonda Cheng, MD  mirtazapine (REMERON) 15 MG tablet Take 15 mg by mouth at bedtime. 06/07/19   [provider]  Multiple Vitamin (MULTIVITAMIN) tablet Take 1 tablet by mouth daily.    [provider]  nitroGLYCERIN (NITROSTAT) 0.4 MG SL tablet Place 0.4 mg under the tongue every 5 (five) minutes as needed for chest pain.  04/30/18   [provider]  NONFORMULARY OR COMPOUNDED ITEM Antifungal solution: Terbinafine 3%, Fluconazole 2%, Tea Tree Oil 5%, Urea 10%, Ibuprofen 2% in DMSO suspension #6mL 06/23/20   [place], Stephani Police, DPM  omeprazole (PRILOSEC OTC) 20 MG tablet 1 tablet    [provider]  omeprazole (PRILOSEC) 20 MG capsule Take 20 mg by mouth 2 (two) times daily before a meal.     [provider]  polyethylene glycol (MIRALAX / GLYCOLAX) packet Take 17 g by mouth daily as needed (for constipation.).     [provider]  revefenacin (YUPELRI) 175 MCG/3ML nebulizer solution Take 3 mLs (175 mcg total) by nebulization daily. 02/13/20   [place], Fonnie Mu, NP  rosuvastatin (CRESTOR) 10 MG tablet TAKE 1 TABLET(10 MG) BY MOUTH DAILY 05/03/19   Nahser, Wonda Cheng, MD  SYNTHROID 25 MCG tablet Take 25 mcg by mouth daily before breakfast.  02/05/19   [provider]  tamsulosin (FLOMAX) 0.4 MG CAPS capsule Take 0.4 mg by mouth daily after breakfast.     [provider]    Allergies    Bupropion, Ciprofloxacin-dexamethasone, Gabapentin, Ciprofloxacin hcl, and Clonidine hcl  Review of Systems   Review of Systems  All other systems reviewed and are negative.   Physical Exam Updated Vital Signs BP 107/71   Pulse 61   Temp 98.3 F (36.8 C) (Oral)   Resp 18   Ht 1.803 m (5\' 11" )   Wt 78.9 kg   SpO2 98%   BMI 24.27 kg/m   Physical Exam Vitals and nursing note reviewed.  Constitutional:      General: He  is not in acute distress.    Appearance: He is well-developed and well-nourished.  HENT:     Head: Normocephalic and atraumatic.     Mouth/Throat:     Mouth: Oropharynx is clear and moist.     Pharynx:  No oropharyngeal exudate.  Eyes:     General: No scleral icterus.       Right eye: No discharge.        Left eye: No discharge.     Extraocular Movements: EOM normal.     Conjunctiva/sclera: Conjunctivae normal.     Pupils: Pupils are equal, round, and reactive to light.  Neck:     Thyroid: No thyromegaly.     Vascular: No JVD.  Cardiovascular:     Rate and Rhythm: Normal rate and regular rhythm.     Pulses: Intact distal pulses.     Heart sounds: Normal heart sounds. No murmur heard. No friction rub. No gallop.   Pulmonary:     Effort: Pulmonary effort is normal. No respiratory distress.     Breath sounds: Normal breath sounds. No wheezing or rales.     Comments: With deep breathing the patient triggers his coughing spell however he is in no distress speaks in full sentences and only occasionally coughs Abdominal:     General: Bowel sounds are normal. There is no distension.     Palpations: Abdomen is soft. There is no mass.     Tenderness: There is no abdominal tenderness.  Musculoskeletal:        General: No tenderness or edema. Normal range of motion.     Cervical back: Normal range of motion and neck supple.  Lymphadenopathy:     Cervical: No cervical adenopathy.  Skin:    General: Skin is warm and dry.     Findings: No erythema or rash.  Neurological:     Mental Status: He is alert.     Coordination: Coordination normal.  Psychiatric:        Mood and Affect: Mood and affect normal.        Behavior: Behavior normal.     ED Results / Procedures / Treatments   Labs (all labs ordered are listed, but only abnormal results are displayed) Labs Reviewed - No data to display  EKG EKG Interpretation  Date/Time:  Friday July 18 2020 17:41:21 EST Ventricular Rate:   81 PR Interval:  136 QRS Duration: 86 QT Interval:  384 QTC Calculation: 446 R Axis:   93 Text Interpretation: Normal sinus rhythm Rightward axis Low voltage QRS Borderline ECG Confirmed by Noemi Chapel [phone]) on 07/18/2020 9:32:30 PM   Radiology DG Chest 2 View  Result Date: 07/18/2020 CLINICAL DATA:  Cough.  Shortness of breath.  Wheezing. EXAM: CHEST - 2 VIEW COMPARISON:  04/25/2020.  Chest CT 08/30/2019. FINDINGS: Heart size is normal. Mediastinal shadows are normal. Chronically prominent interstitial markings again noted. Question increasing prominence of nodular shadows in the upper lungs. Chest CT recommended for better characterization. This also goes along with the previous recommendation for yearly CT related to interstitial lung disease given previously. No evidence of pulmonary edema. No pleural effusion. No acute bone finding. IMPRESSION: Chronically prominent interstitial markings. Question increasing prominence of nodular shadows in the upper lungs. Chest CT recommended for better characterization. This goes along with the previous recommendation for yearly CT given previously seen interstitial lung disease. Electronically Signed   By: Nelson Chimes M.D.   On: 07/18/2020 12:14   CT Angio Chest PE W and/or Wo Contrast  Result Date: 07/18/2020 CLINICAL DATA:  Elevated D-dimer. EXAM: CT ANGIOGRAPHY CHEST WITH CONTRAST TECHNIQUE: Multidetector CT imaging of the chest was performed using the standard protocol during bolus administration of intravenous contrast. Multiplanar CT image reconstructions and MIPs were obtained  to evaluate the vascular anatomy. CONTRAST:  60mL OMNIPAQUE IOHEXOL 350 MG/ML SOLN COMPARISON:  August 30, 2019 FINDINGS: Cardiovascular: There is mild calcification of the aortic arch. Satisfactory opacification of the pulmonary arteries to the segmental level. No evidence of pulmonary embolism. Normal heart size. No pericardial effusion. Mediastinum/Nodes: No enlarged  mediastinal, hilar, or axillary lymph nodes. Thyroid gland, trachea, and esophagus demonstrate no significant findings. Lungs/Pleura: Mild, stable biapical scarring and/or atelectasis is seen. A stable 6 mm noncalcified lung nodule is seen within the anterolateral aspect of the left lower lobe (axial CT image 87, CT series number 7). There is no evidence of acute infiltrate, pleural effusion or pneumothorax. Upper Abdomen: A small hiatal hernia is seen. A moderate amount of herniated fat is also seen along the medial aspect of the right upper quadrant. This area contains a mild amount of inflammatory fat stranding and a moderate amount of low attenuation (approximately 9.48 Hounsfield units), which is increased in severity when compared to the prior study. A stable 4.2 cm x 3.1 cm cystic appearing area is seen along the upper pole of the right kidney. Musculoskeletal: Multilevel degenerative changes seen throughout the thoracic spine. Review of the MIP images confirms the above findings. IMPRESSION: 1. No evidence of pulmonary embolus or acute infiltrate. 2. Stable, benign-appearing 6 mm noncalcified left lower lobe lung nodule. 3. Moderate amount of right-sided para esophageal herniated fat with an associated fluid and/or inflammatory component. While this finding is nonspecific in nature, an area of fat necrosis cannot be excluded. 4. Aortic atherosclerosis. Aortic Atherosclerosis (ICD10-I70.0). Electronically Signed   By: Virgina Norfolk M.D.   On: 07/18/2020 19:33    Procedures Procedures (including critical care time)  Medications Ordered in ED Medications  iohexol (OMNIPAQUE) 350 MG/ML injection 65 mL (65 mLs Intravenous Contrast Given 07/18/20 1910)    ED Course  I have reviewed the triage vital signs and the nursing notes.  Pertinent labs & imaging results that were available during my care of the patient were reviewed by me and considered in my medical decision making (see chart for  details).    MDM Rules/Calculators/A&P                          The chest x-ray which was performed earlier has been followed up by a CT scan here in the emergency department.  There is no signs of pulmonary embolism, there is a small nodule, there is no signs of infection, there is a paraesophageal hernia which is of no consequence at this time.  The patient has been informed of all of these results, I will prescribe Tessalon, he is stable for discharge without any signs of pathologic cause of his symptoms.  His vital signs are normal, see below  Vitals:   07/18/20 1910 07/18/20 2017  BP: 114/68 107/71  Pulse: 61 61  Resp: 17 18  Temp: 98.3 F (36.8 C)   SpO2: 99% 98%     Final Clinical Impression(s) / ED Diagnoses Final diagnoses:  Bronchitis  Lung nodule    Rx / DC Orders ED Discharge Orders         Ordered    benzonatate (TESSALON) 100 MG capsule  Every 8 hours        07/18/20 2155           Noemi Chapel, MD 07/18/20 2158

## 2020-07-18 NOTE — ED Triage Notes (Signed)
Pt recently diagnosed with acute bronchitis, seen at pulmonary office today and had chest xray and labs done, d dimer elevated, covid test negative, sent here for CT of chest to rule out PE. resp e.u at this time.

## 2020-07-18 NOTE — Telephone Encounter (Signed)
Called and spoke with pt who stated he did go get tested for covid 1/8 and he finally received the results back today 1/14 which did come back negative.  Pt is still having complaints of cough which is nonproductive, rattling in his lungs which he has had for 5 days now, increased SOB, wheezing, and pain on right side of chest.  Pt said he has had pna twice in the past and is wondering if this could be what is the cause of his symptoms.  Pt did have a fever but states last day he ran a temp was 3 days ago. Pt has not checked temp today 1/14 but states he does not feel feverish.  Pt had been taking tylenol due to having complaints of a headache but stated headache has subsided. Last day pt took tylenol was 1 day ago.  Pt has also been taking mucinex D twice daily as well as delsym. Pt was on prednisone and doxycycline which he finished both 2-3 days ago.  Due to pt's symptoms and also since he was negative for covid, pt is wanting recommendations.  Checked our providers schedules for today 1/14 and there are no openings. MR, please advise if you would like for pt to come to the office for a cxr to see if he might have pna. Also, please advise if we can send in more meds for pt to further help with symptoms or if you have any other recs.

## 2020-07-18 NOTE — Telephone Encounter (Signed)
Pt did call back and was tested for covid which came back negative.

## 2020-07-18 NOTE — Telephone Encounter (Signed)
Noted   - pls hav ehim do stat cxr, cbc, bmet, mag, phos, lft, d-dimer, bnp, troponin . If he can do this by 11.30am and then I can look for results around 4pm to guide  Him  - pls send note back so I can keep track -ICU super busy

## 2020-07-28 ENCOUNTER — Telehealth: Payer: Self-pay | Admitting: Internal Medicine

## 2020-07-28 DIAGNOSIS — R059 Cough, unspecified: Secondary | ICD-10-CM

## 2020-07-28 NOTE — Telephone Encounter (Signed)
Spoke with the pt   He states his cough is not letting up and it's been 20 days now  Went to ED 07/18/20- tessalon prescribed and does not help at all  Cough is occ prod with clear sputum  He states talking can trigger the cough  He is still taking omeprazole bid and yupelri  Not really using the flutter valve often  Denies any increased SOB, wheezing, f/c/s  Asking for something else to help with the cough  Please advise, thanks1  Allergies  Allergen Reactions  . Bupropion Other (See Comments)    Caused altered mental status Altered mental status Caused altered mental status  . Ciprofloxacin-Dexamethasone Other (See Comments)    Burning in ears when Cipro ear drops were used unknown Burning in ears when Cipro ear drops were used  . Gabapentin Other (See Comments)    Caused weakness, dizziness, and forgetfulness  Weakness and dizziness with higher doses. Weakness and dizziness Caused weakness, dizziness, and forgetfulness  . Ciprofloxacin Hcl Other (See Comments)    Burning in ears when drops were used  . Clonidine Hcl Other (See Comments)

## 2020-07-29 NOTE — Telephone Encounter (Signed)
I hve not seen him since June 2021. IN between he has seen APP and had abx.pred/lab work and ER visit  In her ER CT theey are not even reporting much Pulmonary Fibrosis   PLAN The main new t hings is below in  - RED -> does he have a GI doc? - this can cause cough. If no GI doc - needs GI referral  Also: can be cough neuropathy for which he might need gabapentin but this causes side effets  I am struggling on phone at this point to Rx him  Can he wait till he sees me ?  Cancel HRCT 08/15/20 CT - already had a regular CT  Can he do feno and spiro pre and post bd with dlco before he sees me?  Thanks  MR xxxxx   IMPRESSION: 1. No evidence of pulmonary embolus or acute infiltrate. 2. Stable, benign-appearing 6 mm noncalcified left lower lobe lung nodule. 3. Moderate amount of right-sided para esophageal herniated fat with an associated fluid and/or inflammatory component. While this finding is nonspecific in nature, an area of fat necrosis cannot be excluded. 4. Aortic atherosclerosis.  Aortic Atherosclerosis (ICD10-I70.0).   Electronically Signed   By: Virgina Norfolk M.D.   On: 07/18/2020 19:33 xxxxxxxxxxx Lab Results  Component Value Date   NITRICOXIDE 14 05/26/2016      xxx Allergies  Allergen Reactions  . Bupropion Other (See Comments)    Caused altered mental status Altered mental status Caused altered mental status  . Ciprofloxacin-Dexamethasone Other (See Comments)    Burning in ears when Cipro ear drops were used unknown Burning in ears when Cipro ear drops were used  . Gabapentin Other (See Comments)    Caused weakness, dizziness, and forgetfulness  Weakness and dizziness with higher doses. Weakness and dizziness Caused weakness, dizziness, and forgetfulness  . Ciprofloxacin Hcl Other (See Comments)    Burning in ears when drops were used  . Clonidine Hcl Other (See Comments)

## 2020-07-29 NOTE — Telephone Encounter (Signed)
Called and spoke with patient who states that he does have a GI doctor, he does have GERD and has been on medication for 20 years but can follow up with them if needed. Patient would like to keep the HRCT to make sure that it doesn't show anything new and wanted to see if it shows any progression. Patient is going to come in the same day as his appointment 1/31 and do Feno and Spiro at 9:00am and then see MR at 3:00pm. He is scheduled for a covid test on 1/28. Patient has expressed understanding to all of this. Advised to call back if he needs anything before then. Will route to [name] to make sure that he is aware. Nothing further needed at this time.

## 2020-08-01 ENCOUNTER — Other Ambulatory Visit (HOSPITAL_COMMUNITY)
Admission: RE | Admit: 2020-08-01 | Discharge: 2020-08-01 | Disposition: A | Discharge status: Home / Self Care | Payer: Medicare Other | Source: Ambulatory Visit | Attending: Internal Medicine | Admitting: Internal Medicine

## 2020-08-01 DIAGNOSIS — Z01812 Encounter for preprocedural laboratory examination: Secondary | ICD-10-CM | POA: Diagnosis not present

## 2020-08-01 DIAGNOSIS — Z20822 Contact with and (suspected) exposure to covid-19: Secondary | ICD-10-CM | POA: Diagnosis not present

## 2020-08-01 LAB — SARS CORONAVIRUS 2 (TAT 6-24 HRS): SARS Coronavirus 2: NEGATIVE

## 2020-08-04 ENCOUNTER — Other Ambulatory Visit: Payer: Self-pay | Admitting: *Deleted

## 2020-08-04 ENCOUNTER — Ambulatory Visit: Payer: Medicare Other | Admitting: Internal Medicine

## 2020-08-04 ENCOUNTER — Other Ambulatory Visit: Payer: Self-pay

## 2020-08-04 ENCOUNTER — Ambulatory Visit (INDEPENDENT_AMBULATORY_CARE_PROVIDER_SITE_OTHER): Payer: Medicare Other | Admitting: Internal Medicine

## 2020-08-04 DIAGNOSIS — R059 Cough, unspecified: Secondary | ICD-10-CM

## 2020-08-04 DIAGNOSIS — J849 Interstitial pulmonary disease, unspecified: Secondary | ICD-10-CM

## 2020-08-04 LAB — PULMONARY FUNCTION TEST
FEF 25-75 Pre: 3.13 L/sec
FEF2575-%Pred-Pre: 117 %
FEV1-%Pred-Pre: 70 %
FEV1-Pre: 2.48 L
FEV1FVC-%Pred-Pre: 114 %
FEV6-%Pred-Pre: 65 %
FEV6-Pre: 2.95 L
FEV6FVC-%Pred-Pre: 105 %
FVC-%Pred-Pre: 61 %
FVC-Pre: 2.95 L
Pre FEV1/FVC ratio: 84 %
Pre FEV6/FVC Ratio: 100 %

## 2020-08-04 LAB — POCT EXHALED NITRIC OXIDE: FeNO level (ppb): 30

## 2020-08-04 NOTE — Progress Notes (Signed)
feno ordered

## 2020-08-04 NOTE — Progress Notes (Signed)
Spirometry/Feno performed today.

## 2020-08-05 ENCOUNTER — Ambulatory Visit (INDEPENDENT_AMBULATORY_CARE_PROVIDER_SITE_OTHER): Payer: Medicare Other | Admitting: Primary Care

## 2020-08-05 ENCOUNTER — Encounter: Payer: Self-pay | Admitting: Neurology

## 2020-08-05 ENCOUNTER — Ambulatory Visit (INDEPENDENT_AMBULATORY_CARE_PROVIDER_SITE_OTHER): Payer: Medicare Other | Admitting: Neurology

## 2020-08-05 ENCOUNTER — Telehealth: Payer: Self-pay | Admitting: Primary Care

## 2020-08-05 VITALS — BP 109/77 | HR 84 | Ht 70.0 in | Wt 181.0 lb

## 2020-08-05 DIAGNOSIS — J849 Interstitial pulmonary disease, unspecified: Secondary | ICD-10-CM | POA: Diagnosis not present

## 2020-08-05 DIAGNOSIS — R4189 Other symptoms and signs involving cognitive functions and awareness: Secondary | ICD-10-CM | POA: Diagnosis not present

## 2020-08-05 DIAGNOSIS — R413 Other amnesia: Secondary | ICD-10-CM | POA: Diagnosis not present

## 2020-08-05 DIAGNOSIS — R410 Disorientation, unspecified: Secondary | ICD-10-CM

## 2020-08-05 DIAGNOSIS — J984 Other disorders of lung: Secondary | ICD-10-CM

## 2020-08-05 DIAGNOSIS — R419 Unspecified symptoms and signs involving cognitive functions and awareness: Secondary | ICD-10-CM | POA: Insufficient documentation

## 2020-08-05 MED ORDER — ALBUTEROL SULFATE HFA 108 (90 BASE) MCG/ACT IN AERS: 1.0000 | INHALATION_SPRAY | Freq: Four times a day (QID) | RESPIRATORY_TRACT | 2 refills | Status: DC | PRN

## 2020-08-05 NOTE — Progress Notes (Signed)
PATIENT: [name] DOB: Mar 30, 1951  REASON FOR VISIT: follow up HISTORY FROM: patient  HISTORY OF PRESENT ILLNESS: Today 08/05/20  HISTORY  [name] is a 70 year old male, seen in request by his primary care physician [name], for sudden onset of confusion, initial evaluation was through virtual visit on January 02, 2019  I have reviewed and summarized the referring note from the referring physician.  He had a past medical history of ADHD, has been taking Ritalin 10 mg 3 times a day over the past 30 years, depression anxiety, taking Prozac,  He is a retired Curator for [employer], had excellent memory in the past, was noted to have gradual onset memory loss since 2015, but still functioning very well, taking care of his grandchildren at 65 months, 50 and 13 years old, enjoying reading and writing.  On December 29, 2018, while he was driving, he noted sudden onset confusion, he cannot remember where he is going, how he got there, he drove around for about 10 to 15 minutes, stopped at a parking lot in a grocery store, he was able to call his family to pick him up, he was sent to the emergency room  I personally reviewed MRI of the brain, age-appropriate mild atrophy no acute abnormality  Laboratory evaluations showed normal free T4, ammonia, B12, negative alcohol, UDS, mild elevated TSH, normal CMP with exception of mild elevated glucose 139, normal CBC  Update February 26, 2019:  He is accompanied by his wife at today's clinic visit, patient continues to have significant memory loss, he reported upper respiratory on June 22, 2019, lasting for 50 days, violent cough, repeat COVID-19 antibody testing was positive in March and June 2020  Since March 2020, he noticed increased memory loss, he described mentally exhausted all the time, confused easily, response time is slower, there also anxious moment, he can no longer solve complex issues, feels frustrated  easily, difficulty with word finding, had vivid dreams, got lost while driving, frequent missed spelling, could not recall the month, or even season, increased bilateral tinnitus, difficulty sleeping, seeing moving objects in his peripheral visual field  He had long history of depression, is still taking fluoxetine 40 mg daily,  I was able to see his neuropsychiatric evaluation by Dr. Marlane Hatcher, initial evaluation was on April 29, 2017 reported a history of daily cigarette use 1/2 pack for 22 years, marijuana daily from [age], alcohol use 2-5 beers 5 times a week, stopped about 15 years ago  He had long history of depression, self admitted to psychiatric hospital 1973, after experiencing flashbacks of trauma, that particular neuropsychiatric evaluation showed sufficient impairment in executive functioning, with possibility of reversible, there was no impairment in memory,  He did have a follow-up repeat neuropsychiatric evaluation in January 2020, which showed marked improvement across multiple cognitive domains in comparison to his scores in October 2018, giving his current presentation scores, assess subjective reports of dramatic improvement of cognitive symptoms the past 10 to 11 months, he was suggested to continue follow-up with his psychiatrist Dr. Casimiro Needle  Laboratory evaluations in June 2020: Normal B12, mild elevated TSH 5.6, UDS,  Update August 05, 2020 SS: Lost to follow-up since Aug 2020. EEG was normal Sept 2020.  Had consultation with [facility] neurology September 2020, repeat neuropsychological testing and a sleep study were ordered. I don't see the results.  They reported they had neuropsychological testing (results were better?).  Apparently, his memory symptoms improved for several months (  related to stopping Wellbutrin?).  Never had sleep study, at the time, dealing with insomnia.  About 8 months ago, reported onset of difficulty with directions, mapping, has always been a  visual person, could no longer visualize his driving path in his mind, started using GPS to drive.   Also, repetitive questioning, short-term memory loss, he still plays games on the computer with competitors, he can still win.  He is very analytical, used to work for [employer], is hypersensitive to any change in his body.  His wife thinks he is focusing on little things, making them big.  Any small change, he thinks he has dementia (on maternal side, some relatives with history of).  He sometimes forgets to take his medications.  His wife has always handled the finances and shopping.  They care for their grandkids a few days during the week. He used to remember thousands of random fact with his occupation, tries to recall them now and can't, this bothers him.  Is sleeping well, on Remeron 15 mg at bedtime.  Also on Ritalin.  No longer see psychiatry.  Claims mental health is under good control.  Has little stress in their life.   Has close follow-up with pulmonary due to interstitial lung disease, chronic respiratory failure, and COPD.  Here today with his wife, Wendie Simmer. She thinks his issues are normal aging or him being hypersensitive. He left his coat in the lobby today on accident, worried during visit about it still being there. MMSE 27/30 today.  REVIEW OF SYSTEMS: Out of a complete 14 system review of symptoms, the patient complains only of the following symptoms, and all other reviewed systems are negative.  See HPI  ALLERGIES: Allergies  Allergen Reactions  . Bupropion Other (See Comments)    Caused altered mental status Altered mental status Caused altered mental status  . Ciprofloxacin-Dexamethasone Other (See Comments)    Burning in ears when Cipro ear drops were used unknown Burning in ears when Cipro ear drops were used  . Gabapentin Other (See Comments)    Caused weakness, dizziness, and forgetfulness  Weakness and dizziness with higher doses. Weakness and dizziness Caused  weakness, dizziness, and forgetfulness  . Ciprofloxacin Hcl Other (See Comments)    Burning in ears when drops were used  . Clonidine Hcl Other (See Comments)    HOME MEDICATIONS: Outpatient Medications Prior to Visit  Medication Sig Dispense Refill  . albuterol (VENTOLIN HFA) 108 (90 Base) MCG/ACT inhaler Inhale 1-2 puffs into the lungs every 6 (six) hours as needed for wheezing or shortness of breath. 18 g 2  . aspirin 81 MG chewable tablet Chew 81 mg by mouth daily.    . celecoxib (CELEBREX) 200 MG capsule 1 capsule with food as needed    . famotidine (PEPCID) 20 MG tablet Take 1 tablet (20 mg total) by mouth at bedtime. 30 tablet 3  . methylphenidate (RITALIN) 10 MG tablet Take 10 mg by mouth 3 (three) times daily.    . metoprolol succinate (TOPROL-XL) 25 MG 24 hr tablet TAKE 1 TABLET(25 MG) BY MOUTH DAILY 90 tablet 3  . mirtazapine (REMERON) 15 MG tablet Take 15 mg by mouth at bedtime.    . Multiple Vitamin (MULTIVITAMIN) tablet Take 1 tablet by mouth daily.    . nitroGLYCERIN (NITROSTAT) 0.4 MG SL tablet Place 0.4 mg under the tongue every 5 (five) minutes as needed for chest pain.     . NONFORMULARY OR COMPOUNDED ITEM Antifungal solution: Terbinafine 3%, Fluconazole  2%, Tea Tree Oil 5%, Urea 10%, Ibuprofen 2% in DMSO suspension #40mL 1 each 3  . omeprazole (PRILOSEC OTC) 20 MG tablet 1 tablet    . omeprazole (PRILOSEC) 20 MG capsule Take 20 mg by mouth 2 (two) times daily before a meal.    . polyethylene glycol (MIRALAX / GLYCOLAX) packet Take 17 g by mouth daily as needed (for constipation.).     Marland Kitchen revefenacin (YUPELRI) 175 MCG/3ML nebulizer solution Take 3 mLs (175 mcg total) by nebulization daily. 90 mL 12  . rosuvastatin (CRESTOR) 10 MG tablet TAKE 1 TABLET(10 MG) BY MOUTH DAILY 90 tablet 3  . SYNTHROID 25 MCG tablet Take 25 mcg by mouth daily before breakfast.     . tamsulosin (FLOMAX) 0.4 MG CAPS capsule Take 0.4 mg by mouth daily after breakfast.     . FLUoxetine (PROZAC) 40  MG capsule Take 40 mg by mouth every morning.     . benzonatate (TESSALON) 100 MG capsule Take 1 capsule (100 mg total) by mouth every 8 (eight) hours. (Patient not taking: Reported on 08/05/2020) 21 capsule 0   No facility-administered medications prior to visit.    PAST MEDICAL HISTORY: Past Medical History:  Diagnosis Date  . Arthritis   . Bronchitis    history of  . 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 GERD (gastroesophageal reflux disease)   . Hiatal hernia   . 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 History of echocardiogram    Echo 11/2019: EF 65-70, no R WMA, mild concentric LVH, normal RV SF, normal PASP (RVSP 19.2 mmHg), trivial MR  . Hypercalcemia   . Lipoma    left forearm (3)  . Malignant melanoma of skin ([place]) 06/23/2020  . Mitral valve prolapse    does not see cardiologist for. last stress test 2003  . Pneumonia 2009  . Tumor cells, benign    lung, left side  . Unstable angina ([place]) 10/28/2017    PAST SURGICAL HISTORY: Past Surgical History:  Procedure Laterality Date  . APPENDECTOMY  1984  . CATARACT EXTRACTION     L and R eye  . CHOLECYSTECTOMY  2002  . HYDROCELE EXCISION Right 11/04/2016   Procedure: HYDROCELECTOMY ADULT;  Surgeon: Franchot Gallo, MD;  Location: [facility];  Service: Urology;  Laterality: Right;  . INGUINAL HERNIA REPAIR Bilateral 11/04/2016   Procedure: HERNIA REPAIR INGUINAL ADULT BILATERAL;  Surgeon: Jackolyn Confer, MD;  Location: [facility];  Service: General;  Laterality: Bilateral;  . INSERTION OF MESH Bilateral 11/04/2016   Procedure: INSERTION OF MESH;  Surgeon: Jackolyn Confer, MD;  Location: [facility];  Service: General;  Laterality: Bilateral;  . JOINT REPLACEMENT     Lt hip  . LEFT HEART CATH AND CORONARY ANGIOGRAPHY N/A 10/31/2017   Procedure: LEFT HEART CATH AND CORONARY ANGIOGRAPHY;  Surgeon: Troy Sine, MD;  Location: [facility];  Service: Cardiovascular;  Laterality: N/A;  . MASTOIDECTOMY  1996  . NASAL SEPTUM SURGERY      sinus surgery  . PARATHYROIDECTOMY    . TOTAL HIP REVISION  06/14/2011   Procedure: TOTAL HIP REVISION;  Surgeon: Kerin Salen;  Location: [place];  Service: Orthopedics;  Laterality: Left;  Left Acetabular  Hip Revision    FAMILY HISTORY: Family History  Problem Relation Age of Onset  . Lung cancer Father 60       smoked  . Heart Problems Father   . Cancer Brother  prostate  . Heart Problems Mother   . Neuropathy Sister   . Healthy Son   . Healthy Daughter   . Healthy Daughter     SOCIAL HISTORY: Social History   Socioeconomic History  . Marital status: Married    Spouse name: Arville Go  . Number of children: 3  . Years of education: masters  . Highest education level: Not on file  Occupational History    Comment: Sales  Tobacco Use  . Smoking status: Former Smoker    Packs/day: 1.50    Years: 24.00    Pack years: 36.00    Types: Cigarettes    Quit date: 07/06/1987    Years since quitting: 33.1  . Smokeless tobacco: Never Used  Vaping Use  . Vaping Use: Never used  Substance and Sexual Activity  . Alcohol use: Yes    Comment: occ  . Drug use: No  . Sexual activity: Not on file  Other Topics Concern  . 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   Caffeine - one cup daily.   Social Determinants of Health   Financial Resource Strain: Not on file  Food Insecurity: Not on file  Transportation Needs: Not on file  Physical Activity: Not on file  Stress: Not on file  Social Connections: Not on file  Intimate Partner Violence: Not on file   PHYSICAL EXAM  There were no vitals filed for this visit. There is no height or weight on file to calculate BMI.  Generalized: Well developed, in no acute distress  MMSE - Mini Mental State Exam 08/05/2020  Orientation to time 4  Orientation to Place 5  Registration 3  Attention/ Calculation 5  Recall 1   Language- name 2 objects 2  Language- repeat 1  Language- follow 3 step command 3  Language- read & follow direction 1  Write a sentence 1  Copy design 1  Total score 27    Neurological examination  Mentation: Alert oriented to time, place, history taking. Follows all commands speech and language fluent Cranial nerve II-XII: Pupils were equal round reactive to light. Extraocular movements were full, visual field were full on confrontational test. Facial sensation and strength were normal. Head turning and shoulder shrug  were normal and symmetric. Motor: The motor testing reveals 5 over 5 strength of all 4 extremities. Good symmetric motor tone is noted throughout.  Sensory: Sensory testing is intact to soft touch on all 4 extremities. No evidence of extinction is noted.  Coordination: Cerebellar testing reveals good finger-nose-finger and heel-to-shin bilaterally.  Gait and station: Gait is normal. Tandem gait is slightly unsteady. Romberg is negative. No drift is seen.  Reflexes: Deep tendon reflexes are symmetric and normal bilaterally.   DIAGNOSTIC DATA (LABS, IMAGING, TESTING) - I reviewed patient records, labs, notes, testing and imaging myself where available.  Lab Results  Component Value Date   WBC 6.4 07/18/2020   HGB 15.0 07/18/2020   HCT 43.3 07/18/2020   MCV 92.5 07/18/2020   PLT 174.0 07/18/2020      Component Value Date/Time   NA 142 07/18/2020 1146   NA 142 02/13/2018 0851   K 4.2 07/18/2020 1146   CL 104 07/18/2020 1146   CO2 29 07/18/2020 1146   GLUCOSE 90 07/18/2020 1146   BUN 22 07/18/2020 1146   BUN 16 02/13/2018 0851   CREATININE 1.03  07/18/2020 1146   CREATININE 1.01 08/17/2019 1517   CALCIUM 9.0 07/18/2020 1146   CALCIUM 9.0 02/19/2011 1233   PROT 6.0 07/18/2020 1146   PROT 6.4 02/13/2018 0851   ALBUMIN 4.1 07/18/2020 1146   ALBUMIN 4.1 02/13/2018 0851   AST 24 07/18/2020 1146   AST 25 08/17/2019 1517   ALT 29 07/18/2020 1146   ALT 43  08/17/2019 1517   ALKPHOS 77 07/18/2020 1146   BILITOT 0.5 07/18/2020 1146   BILITOT 0.3 08/17/2019 1517   GFRNONAA >60 08/17/2019 1517   GFRAA >60 08/17/2019 1517   Lab Results  Component Value Date   CHOL 134 02/13/2018   HDL 41 02/13/2018   LDLCALC 66 02/13/2018   TRIG 137 02/13/2018   CHOLHDL 3.3 02/13/2018   Lab Results  Component Value Date   HGBA1C 5.4 10/28/2017   Lab Results  Component Value Date   [id number] 12/29/2018   Lab Results  Component Value Date   TSH 5.679 (H) 12/29/2018      ASSESSMENT AND PLAN 69 y.o. year old male  has a past medical history of Arthritis, Bronchitis, Dysrhythmia, GERD (gastroesophageal reflux disease), Hiatal hernia, Hip joint replacement by other means (2004), History of echocardiogram, Hypercalcemia, Lipoma, Malignant melanoma of skin ([place]) (06/23/2020), Mitral valve prolapse, Pneumonia (2009), Tumor cells, benign, and Unstable angina ([place]) (10/28/2017). here with:  1.  Constellation of concerns about cognitive malfunction/memory troubles, in the setting of long-term depression, mood disorder -Symptoms improved, then worsened about 8 months ago with new concerns (unable to map/navigate directions), with no explanation, today, MMSE 27/30 -Will recheck MRI of the brain without contrast, previous MRI of the brain in June 2020 showed no acute abnormality, age-appropriate mild generalized atrophy, mild small vessel disease -EEG was normal September 2020 -Check Laboratory evaluation (B12, RPR, TSH, sed rate) -Referral for neuropsychological evaluation (reportedly was done at [facility] in late 2020, I see no report available), he has a lot of concern he is developing dementia -Indicates mood disorder is under good control, no longer see psychiatry, sleeping well with Remeron, off Prozac -Follow-up in 4 months or sooner if needed with Dr. Krista Blue  I spent 30 minutes of face-to-face and non-face-to-face time with patient.  This included previsit  chart review, lab review, study review, order entry, electronic health record documentation, patient education.  Butler Denmark, AGNP-C, DNP 08/05/2020, 3:47 PM [facility] [address], [place], [place] [address] [phone]

## 2020-08-05 NOTE — Telephone Encounter (Signed)
Needs FU with MR in 1 month after CT scan (ILD slot)

## 2020-08-05 NOTE — Patient Instructions (Signed)
Check labs, MRI  Refer for neuropsych testing  See you back in 4 months

## 2020-08-05 NOTE — Progress Notes (Signed)
Virtual Visit via Telephone Note  I connected with [name] on 08/05/20 at 11:30 AM EST by telephone and verified that I am speaking with the correct person using two identifiers.  Location: Patient: Home Provider: Office   I discussed the limitations, risks, security and privacy concerns of performing an evaluation and management service by telephone and the availability of in person appointments. I also discussed with the patient that there may be a patient responsible charge related to this service. The patient expressed understanding and agreed to proceed.   History of Present Illness: 70 year old male, former smoker quit in 1989 (36-pack-year history).  Significant for interstitial lung disease, bronchiectasis, COPD/emphysema, chronic respiratory failure with hypoxia.  Swami, last seen in office by pulmonary nurse practitioner on 03/25/2020.  Previous [facility] pulmonary encounter: 03/25/2020  - Visit  70 year old male former smoker followed in our office for chronic respiratory failure and COPD.  Followed by [name] patient was last seen in our office on 02/13/2020.  He was seen by TP NP at that office visit is recommended the patient be treated with Augmentin, prednisone, add Yupelri nebulized medication, obtain pulmonary function testing.   Patient reports that he is feeling significantly better since last being treated in August/2021.  He feels he is back to his baseline.  He is not still currently using Yupelri.  He is unsure if this actually helped him.  He reports it is difficult for him to assess given the fact that he was being acutely treated as a COPD exacerbation while he was trialed on it.  Patient would like to review most recent pulmonary function test today.  He reports that most recent plan as discussed with him by [name] was to repeat a CT scan next year to follow the February/2021 HRCT.   08/05/2020 - Interim hx Patient contacted today to review recent PFTs. He  is doing well today. He had a cough x1 month with associated wheezing for 5 days. His cough has mostly resolved. He still has some shortness of breath. He stopped using yupeli. He did not find this helpful. In the past insurance has not covered Symbicort and is not covering Albuterol. Pulmonary function testing appears stable. FENO was mildly elevated at 30. He has a follow-up HRCT in February.    Observations/Objective:  - Able to speak in full sentences; no overt shortness of breath or wheezing   Significant tests:  02/13/2020-chest x-ray-no active cardiopulmonary disease  03/11/2020-pulmonary function test-FVC 3.17 (65% predicted), ratio 83, FEV1 2.64 (74% predicted), DLCO 24.75 (89% predicted)  11/09/2019-echocardiogram-LV ejection fraction 65 to 70%, estimated right ventricular systolic pressure is 78.2  08/30/2019-CT chest high-res-mild tubular bronchiectasis and bronchial wall thickening throughout with very fine predominantly peripheral centrilobular nodularity in the upper lungs, regular interstitial opacity which is slightly increased, findings are indeterminate for UIP, consider annual CT follow-up to assess for stability of fibrotic findings and pattern of fibrotic interstitial lung disease remains suspected, minimal paraseptal emphysema, multiple small bilateral pulmonary nodules measuring up to 6 mm, CAD  08/05/19- FVC 2.95 (61%), FEV1 2.48 (70%), ratio 84 08/05/19- FENO 30  Assessment and Plan:  ILD: - Occasional cough, mild dyspnea  - He has a follow-up HRCT scheduled for February 2022 - Diffusion capacity was not done with recent Spirometry testing  - May likely need bronch with envisia or surgical lung biopsy if there is progression on imaging  - Needs FU with MR after CT scan   Restrictive lung disease: - Moderate restriction  without overt obstruction  - FEV1 decreased 4% from September 2021. FENO 30   - RX Ventolin 2 puffs q 6 hours, consider ICS/LABA at next visit if needed    Emphysema: - Rarely uses Yupelri, no noticeable improvement   Follow Up Instructions:  1 month with MR    I discussed the assessment and treatment plan with the patient. The patient was provided an opportunity to ask questions and all were answered. The patient agreed with the plan and demonstrated an understanding of the instructions.   The patient was advised to call back or seek an in-person evaluation if the symptoms worsen or if the condition fails to improve as anticipated.  I provided 22 minutes of non-face-to-face time during this encounter.   Martyn Ehrich, NP

## 2020-08-06 ENCOUNTER — Telehealth: Payer: Self-pay | Admitting: Neurology

## 2020-08-06 LAB — VITAMIN B12: Vitamin B-12: 919 pg/mL (ref 232–1245)

## 2020-08-06 LAB — TSH: TSH: 3.51 u[IU]/mL (ref 0.450–4.500)

## 2020-08-06 LAB — SEDIMENTATION RATE: Sed Rate: 13 mm/hr (ref 0–30)

## 2020-08-06 LAB — RPR: RPR Ser Ql: NONREACTIVE

## 2020-08-06 NOTE — Telephone Encounter (Signed)
medicare/aarp order sent to GI. No auth they will reach out to the patient to schedule.  °

## 2020-08-06 NOTE — Telephone Encounter (Signed)
Called and spoke with pt and wife and have gotten pt scheduled for OV with MR after CT. Nothing further needed.

## 2020-08-07 ENCOUNTER — Telehealth: Payer: Self-pay

## 2020-08-07 ENCOUNTER — Ambulatory Visit
Admission: RE | Admit: 2020-08-07 | Discharge: 2020-08-07 | Disposition: A | Discharge status: Home / Self Care | Payer: Medicare Other | Source: Ambulatory Visit | Attending: Neurology | Admitting: Neurology

## 2020-08-07 ENCOUNTER — Other Ambulatory Visit: Payer: Self-pay

## 2020-08-07 DIAGNOSIS — R410 Disorientation, unspecified: Secondary | ICD-10-CM | POA: Diagnosis not present

## 2020-08-07 NOTE — Telephone Encounter (Signed)
-----   Message from Suzzanne Cloud, NP sent at 08/07/2020  4:33 PM EST ----- I called the patient.  MRI of the brain was unremarkable, and showed only mild age-related changes of chronic small vessel disease.  Nothing to explain new concern of memory issues.

## 2020-08-07 NOTE — Telephone Encounter (Signed)
Pt verified by name and DOB, results given per provider, pt voiced understanding all question answered. 

## 2020-08-07 NOTE — Telephone Encounter (Signed)
-----   Message from Suzzanne Cloud, NP sent at 08/06/2020  5:58 AM EST ----- Please call, blood work is normal.  Nothing to explain memory concerns.

## 2020-08-15 ENCOUNTER — Ambulatory Visit
Admission: RE | Admit: 2020-08-15 | Discharge: 2020-08-15 | Disposition: A | Discharge status: Home / Self Care | Payer: Medicare Other | Source: Ambulatory Visit | Attending: Pulmonary Disease | Admitting: Pulmonary Disease

## 2020-08-15 ENCOUNTER — Other Ambulatory Visit: Payer: Self-pay

## 2020-08-15 ENCOUNTER — Inpatient Hospital Stay: Admission: RE | Admit: 2020-08-15 | Payer: Medicare Other | Source: Ambulatory Visit

## 2020-08-15 DIAGNOSIS — R918 Other nonspecific abnormal finding of lung field: Secondary | ICD-10-CM | POA: Diagnosis not present

## 2020-08-15 DIAGNOSIS — R0602 Shortness of breath: Secondary | ICD-10-CM | POA: Diagnosis not present

## 2020-08-15 DIAGNOSIS — J849 Interstitial pulmonary disease, unspecified: Secondary | ICD-10-CM

## 2020-08-18 ENCOUNTER — Other Ambulatory Visit (HOSPITAL_COMMUNITY): Payer: Medicare Other

## 2020-08-20 DIAGNOSIS — D179 Benign lipomatous neoplasm, unspecified: Secondary | ICD-10-CM | POA: Diagnosis not present

## 2020-08-20 DIAGNOSIS — J449 Chronic obstructive pulmonary disease, unspecified: Secondary | ICD-10-CM | POA: Diagnosis not present

## 2020-08-20 DIAGNOSIS — K449 Diaphragmatic hernia without obstruction or gangrene: Secondary | ICD-10-CM | POA: Diagnosis not present

## 2020-08-20 DIAGNOSIS — J849 Interstitial pulmonary disease, unspecified: Secondary | ICD-10-CM | POA: Diagnosis not present

## 2020-08-21 ENCOUNTER — Ambulatory Visit: Payer: Medicare Other

## 2020-08-21 ENCOUNTER — Ambulatory Visit: Payer: Medicare Other | Admitting: Internal Medicine

## 2020-09-01 NOTE — Progress Notes (Signed)
Seeing him 09/03/20

## 2020-09-03 ENCOUNTER — Other Ambulatory Visit: Payer: Self-pay

## 2020-09-03 ENCOUNTER — Ambulatory Visit (INDEPENDENT_AMBULATORY_CARE_PROVIDER_SITE_OTHER): Payer: Medicare Other | Admitting: Internal Medicine

## 2020-09-03 ENCOUNTER — Encounter: Payer: Self-pay | Admitting: Internal Medicine

## 2020-09-03 VITALS — BP 108/58 | HR 70 | Temp 98.0°F | Ht 70.0 in | Wt 181.8 lb

## 2020-09-03 DIAGNOSIS — R0602 Shortness of breath: Secondary | ICD-10-CM

## 2020-09-03 DIAGNOSIS — J40 Bronchitis, not specified as acute or chronic: Secondary | ICD-10-CM | POA: Diagnosis not present

## 2020-09-03 DIAGNOSIS — J432 Centrilobular emphysema: Secondary | ICD-10-CM

## 2020-09-03 DIAGNOSIS — J984 Other disorders of lung: Secondary | ICD-10-CM | POA: Diagnosis not present

## 2020-09-03 MED ORDER — SPIRIVA RESPIMAT 2.5 MCG/ACT IN AERS: 2.0000 | INHALATION_SPRAY | Freq: Every day | RESPIRATORY_TRACT | 0 refills | Status: DC

## 2020-09-03 NOTE — Patient Instructions (Addendum)
ICD-10-CM   1. Shortness of breath  R06.02   2. Centrilobular emphysema ([place])  J43.2   3. 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 Frequent episodes of bronchitis  J40     Currently there is no evidence of pulmonary fibrosis  -Likely had viral infection in January 2020 that we are only aware of now and had pulmonary inflammation/fibrosis that then resolved  -Alternatively could have had some mold related pulmonary inflammation from exposure at home and with cleaning of the home this is now resolved  There is slight evidence of emphysema on CT scan of the chest  Mild amount of shortness of breath with exertion relieved by rest that could be multifactorial  Pulmonary function test this abnormal out of proportion to above findings  Overall he is some better however  Plan -Start Spiriva Respimat 2 puffs once daily and pretreatment -Referral pulmonary rehabilitation -Repeat full pulmonary function test in 3 to 4 months -Return sooner if needed -If symptoms and abnormal breathing test continue to persist then we can check for myasthenia gravis and/or consider pulmonary stress testing  Follow-up -3-4 months with [name] but after completing the above tests and rehabilitation

## 2020-09-03 NOTE — Progress Notes (Signed)
OV 10/29/2019  Subjective:  Patient ID: [name], male , DOB: 08/26/1950 , age 70 y.o. , MRN: [id number] , ADDRESS: [address] [place] [address]   10/29/2019 -   Chief Complaint  Patient presents with  . Follow-up    Pt saw Dr. Halford Chessman 3/23 and Dr. Halford Chessman wanted pt to f/u with MR.  Pt states he has been doing good since last visit. Denies any current complaints with breathing.      HPI [name] 70 y.o. -has been referred to the ILD center.  For evaluation of his interstitial lung disease.  He is a former Neurosurgeon.  He tells me that a few years ago he is to be able to walk 3 miles but now has restricted himself to 1-> 1-1/2 miles.  His work-up resulted in a positive rheumatoid factor documented below.  He was referred to rheumatology.  He does not recollect who he saw but he tells me that there is no evidence of rheumatoid arthritis but he has osteoarthritis.  He is a previous smoker having quit 30 years ago.  But more than 30 pack smoking history.  He had a high-resolution CT chest that has both upper lobe and lower lobe findings.  The lower lobe findings are indeterminate for UIP.  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  There is also associated emphysema.  I personally visualized the images.  He had pulmonary function test that shows restriction with low DLCO.   [facility] [facility] Comprehensive ILD Questionnaire - he did one and gave it to NP in feb 2021 who then gave to [place] but currently cannot find it. So, he agreed to do a redo . This is pending currently  Symptoms:  See below   SYMPTOM SCALE - ILD 10/29/2019   O2 use ra  Shortness of Breath 0 -> 5 scale with 5 being worst (score 6 If unable to do)  At rest 1  Simple tasks - showers, clothes change, eating, shaving 1  Household (dishes, doing bed, laundry) 2  Shopping 1.5  Walking level at own pace 1  Walking up Stairs 2  Total (30-36) Dyspnea Score 8.5   How bad is your cough? 0  How bad is your fatigue 1  How bad is nausea 0  How bad is vomiting?  0  How bad is diarrhea? 0  How bad is anxiety? 0  How bad is depression 0        Past Medical History :  GERD severe x 40 yers,  Pnuemonia x 3. First as teenager. Heart disease nos x  On 09/03/2020: He said he had confirmed Covid in January 2020 at the onset of the pandemic.  I reclarify this with him.  We do not have testing till the spring 2020 but he says he had confirmed Covid.  I do not see evidence of this in the record.  He did have influenza A in March 2017  ROS:   Faitgued, arthralia x few years with spine stiffness, dysphiag but not in few years.  Does have occasional heartburn for the last several decades.  Is also had oral ulcers on and off occasionally  FAMILY HISTORY of LUNG DISEASE:  -He smoked cigarettes weekly 1967 in 1987 to 30 cigarettes a day.  He smoked marijuana between 1970 1980 frequently in the 1970s.  Never use cocaine abuse intravenous drug use.   EXPOSURE HISTORY:  -[place]  [place] last 4 years.  Age of the home is 25 years.  At an apartment.  Prior to that lived in her own single-family home.  The last 4 years note dampness.  Previously there was mildew that is being replaced.  He does have a humidifier but he cleans it with vinegar.  No CPAP use no nebulizer use.  No C-minus.  No junk see no misting Fountain.  No pet birds or parakeets.  No pet gerbils or hamsters.  Does not use feather pillows or duvet.  No mold in the [facility] duct.  No music habits.  No guarding habits.  Results for ALIX, STOWERS" (MRN [id number]) as of 09/03/2020 11:55  Ref. Range 07/17/2019 14:21  A. Pullulans Abs Latest Ref Range: Negative  Positive (A)   HOME and HOBBY DETAILS :   -  OCCUPATIONAL HISTORY (122 questions) : Work in Proof[employer].  Worked as a Glass blower/designer.  Otherwise negative.  Does not work in a dusty environment.  Never been exposed to  fumes.    PULMONARY TOXICITY HISTORY (27 items): Prednisone routine December January 2021    Simple office walk 185 feet x  3 laps goal with forehead probe 10/29/2019   O2 used ra  Number laps completed 3  Comments about pace moderate  Resting Pulse Ox/HR 99% and 80/min  Final Pulse Ox/HR 99% and 93/min  Desaturated </= 88% no  Desaturated <= 3% points no  Got Tachycardic >/= 90/min yes  Symptoms at end of test Mild dyspena  Miscellaneous comments x    IMPRESSION: HRCT Mediastinum/Nodes: No enlarged mediastinal, hilar, or axillary lymph nodes. Predominantly fat containing hiatal hernia. Thyroid gland, trachea, and esophagus demonstrate no significant findings.  Lungs/Pleura: There is mild, tubular bronchiectasis and mild bronchial wall thickening throughout, with very fine, predominantly peripheral centrilobular nodularity. There is additional, minimal bibasilar irregular interstitial opacity, which is slightly increased in comparison to prior examination dated 12/29/2015. Minimal underlying paraseptal emphysema. No significant air trapping on expiratory phase imaging. There are multiple small bilateral pulmonary nodules measuring up to 6 mm and most numerous in the lung apices, all stable compared to prior examination dated 12/29/2015 and benign. No pleural effusion or pneumothorax.  1. Mild, tubular bronchiectasis and bronchial wall thickening throughout, with very fine, predominantly peripheral centrilobular nodularity in the upper lungs, similar to prior examination. There is additional, minimal bibasilar irregular interstitial opacity, which is slightly increased in comparison to prior examination dated 12/29/2015. These findings are of uncertain significance, and to some extent very likely reflect sequelae of smoking, particularly in the upper lungs. Findings in the lung bases are nonspecific and may reflect bland infectious or inflammatory scarring or  alternately early interstitial lung disease in an "indeterminate for UIP" pattern by ATS pulmonary fibrosis criteria. Consider annual CT follow-up to assess for stability of fibrotic findings and pattern if fibrotic interstitial lung disease remains suspected. 2. Minimal paraseptal emphysema.  Emphysema (ICD10-J43.9). 3. Multiple small bilateral pulmonary nodules measuring up to 6 mm and most numerous in the lung apices, all stable compared to prior examination dated 12/29/2015 and benign. 4. Coronary artery disease.  Aortic Atherosclerosis (ICD10-I70.0).   Electronically Signed   By: Eddie Candle M.D.   On: 08/30/2019 14:12   Results for DEBBIE, BELLUCCI" (MRN [id number]) as of 10/29/2019 09:46  Ref. Range 09/25/2019 16:01  FVC-Pre Latest Units: L 3.15  FVC-%Pred-Pre Latest Units: % 65  FEV1-Pre Latest Units: L 2.63  FEV1-%Pred-Pre Latest  Units: % 73  Pre FEV1/FVC ratio Latest Units: % 83   Results for [name], Richard "BOB" (MRN [id number]) as of 10/29/2019 09:46  Ref. Range 09/25/2019 16:01  DLCO cor Latest Units: ml/min/mmHg 21.32  DLCO cor % pred Latest Units: % 76   Results for [name], Richard "BOB" (MRN [id number]) as of 09/03/2020 11:55  Ref. Range 07/17/2019 14:21  A. Pullulans Abs Latest Ref Range: Negative  Positive (A)   ROS - per HPI  Results for [name], ARNOLD" (MRN [id number]) as of 10/29/2019 09:46  Ref. Range 10/24/2007 20:39 09/05/2015 12:13 02/26/2019 15:23 07/17/2019 14:21 07/20/2019 14:55 07/20/2019 14:55 08/17/2019 15:17 10/08/2019 10:11  Anti Nuclear Antibody (ANA) Latest Ref Range: Negative    Negative       Angiotensin-Converting Enzyme Latest Ref Range: 9 - 67 U/L    43      Cyclic Citrullin Peptide Ab Latest Units: UNITS        <16  CCP Antibodies IgG/IgA Latest Ref Range: 0 - 19 units  3        ds DNA Ab Latest Ref Range: 0 - 9 IU/mL  <1        RA Latex Turbid. Latest Ref Range: 0.0 - 13.9 IU/mL  36.8 (H)  575 (H)   206.6 (H)   IgG (Immunoglobin G),  Serum Latest Ref Range: 694 - 1618 mg/dL 653 (L)         IgA Latest Ref Range: 68 - 378 mg/dL 140         Scleroderma (Scl-70) (ENA) Antibody, IgG Latest Ref Range: 0.0 - 0.9 AI  <0.2           OV 12/10/2019 - telephone visit.  Patient identified with 2 person identified.  Wife also came on the phone call.   Subjective:  Patient ID: [name], male , DOB: Jul 05, 1951 , age 64 y.o. , MRN: [id number] , ADDRESS: 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  S: doing ok .  Regarding cough:  Cough abated. Not using inahler . Went away after starting spiriva and in a few days resolved. Then stopped spiriva and cough has not recurred. HE is wondering why. Also spiriva costs $220/month. Friend has budesonide/formeterol . I explained this that this is another class. 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  Beyond this we do not have further testing to suggest prognosis.  Therefore biopsy is recommended.  We discussed bronchoscopy as first step with lavage and transbronchial biopsy for RNA genomic analysis.  This case would be under moderate sedation slight/anesthesia versus surgical lung biopsy which would be the gold standard.  Very long discussion about this.  Risks, benefits and limitations explained.  He is going to think about all this do some individual research and call back.    03/25/2020  - Visit  70 year old  male former smoker followed in our office for chronic respiratory failure and  COPD.  Followed by [name] patient was last seen in our office on 02/13/2020.  He was seen by [facility] NP at that office visit is recommended the patient be treated with Augmentin, prednisone, add Yupelri nebulized medication, obtain pulmonary function testing.   Patient reports that he is feeling significantly better since last being treated in August/2021.  He feels he is back to his baseline.  He is not still currently using Yupelri.  He is unsure if this actually helped him.  He reports it is difficult for him to assess given the fact that he was being acutely treated as a COPD exacerbation while he was trialed on it.  Patient would like to review most recent pulmonary function test today.  He reports that most recent plan as discussed with him by [name] was to repeat a CT scan next year to follow the February/2021 HRCT.   08/05/2020 - Interim hx Patient contacted today to review recent PFTs. He is doing well today. He had a cough x1 month with associated wheezing for 5 days. His cough has mostly resolved. He still has some shortness of breath. He stopped using yupeli. He did not find this helpful. In the past insurance has not covered Symbicort and is not covering Albuterol. Pulmonary function testing appears stable. FENO was mildly elevated at 30. He has a follow-up HRCT in February.     Significant tests:  02/13/2020-chest x-ray-no active cardiopulmonary disease  03/11/2020-pulmonary function test-FVC 3.17 (65% predicted), ratio 83, FEV1 2.64 (74% predicted), DLCO 24.75 (89% predicted)  11/09/2019-echocardiogram-LV ejection fraction 65 to 70%, estimated right ventricular systolic pressure is 67.8  08/30/2019-CT chest high-res-mild tubular bronchiectasis and bronchial wall thickening throughout with very fine predominantly peripheral centrilobular nodularity in the upper lungs, regular interstitial opacity which is  slightly increased, findings are indeterminate for UIP, consider annual CT follow-up to assess for stability of fibrotic findings and pattern of fibrotic interstitial lung disease remains suspected, minimal paraseptal emphysema, multiple small bilateral pulmonary nodules measuring up to 6 mm, CAD  08/05/19- FVC 2.95 (61%), FEV1 2.48 (70%), ratio 84 08/05/19- FENO 30   OV 09/03/2020  Subjective:  Patient ID: [name], male , DOB: 22-Nov-1950 , age 41 y.o. , MRN: [id number] , ADDRESS: [address] [place] [address] PCP Jonathon Jordan, MD Patient Care Team: Jonathon Jordan, MD as PCP - General (Family Medicine) Nahser, Wonda Cheng, MD as PCP - Cardiology (Cardiology)  This Provider for this visit: Treatment Team:  Attending Provider: Brand Males, MD    09/03/2020 -   Chief Complaint  Patient presents with  . Follow-up    Pt is here today to discuss results of recent CT. Pt states he still has SOB with exertion and has an occ cough.     HPI [name] 70 y.o. -presents for follow-up.  He is feeling somewhat better as evidenced by the symptom score below.  However in January he felt quite ill with significant respiratory complaints.  Significant amount of phone calls.  Visits to the ER.  His Covid test was negative but it was in the middle of omicron pandemic.  He had CT angiogram in the ER and ruled out pulmonary embolism.  He had follow-up high-resolution CT chest in February 2022 and is previous ILD that he is very concerned about even though was early/mild has resolved.  I personally visualized the 2 scans and do agree with the radiologist.  I also showed them the scans.  There is  only slight evidence of emphysema.  However despite all this improvement it seems that his pulmonary function test is worse.  Do not know with the technique issue.  He still does have some residual shortness of breath however.  Talking to him he tells me that back in January 2020 before his  problem started he had viral infection.  He believes it was COVID-19.  Although COVID-19 officially was not in [place] in January 2020.  He believes he had a positive test but I do not see records of this.  In any event he is better now.  Also there was some mold exposure in his house he is got this cleaned up.  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      SYMPTOM SCALE - ILD 10/29/2019  09/03/2020   O2 use ra ra0  Shortness of Breath 0 -> 5 scale with 5 being worst (score 6 If unable to do)   At rest 1 0  Simple tasks - showers, clothes change, eating, shaving 1 1  Household (dishes, doing bed, laundry) 2 1  Shopping 1.5 Does not shop  Walking level at own pace 1 1  Walking up Stairs 2 2  Total (30-36) Dyspnea Score 8.5 (7 without shopping) 5  How bad is your cough? 0 0  How bad is your fatigue 1 00  How bad is nausea 0 0  How bad is vomiting?  0 0  How bad is diarrhea? 0 0  How bad is anxiety? 0 0  How bad is depression 0 0     Simple office walk 185 feet x  3 laps goal with forehead probe 10/29/2019  09/03/2020   O2 used ra ra  Number laps completed 3  order  Comments about pace moderate avg  Resting Pulse Ox/HR 99% and 80/min 99% and 71/min  Final Pulse Ox/HR 99% and 93/min 97% and 98  Desaturated </= 88% no no  Desaturated <= 3% points no no  Got Tachycardic >/= 90/min yes ues  Symptoms at end of test Mild dyspena Very mild dyspneax  Miscellaneous comments x     CT Chest data 08/15/20 HRCT   TECHNIQUE: Multidetector CT imaging of the chest was performed following the standard protocol without intravenous contrast. High resolution imaging of the lungs, as well as inspiratory and expiratory imaging, was performed.  COMPARISON:  07/18/2020, 08/30/2019  FINDINGS: Cardiovascular: Scattered aortic atherosclerosis. Normal heart size. Three-vessel coronary artery calcifications. No  pericardial effusion.  Mediastinum/Nodes: No enlarged mediastinal, hilar, or axillary lymph nodes. Primarily fat containing hiatal hernia. Thyroid gland, trachea, and esophagus demonstrate no significant findings.  Lungs/Pleura: Scattered tiny centrilobular nodules at the lung apices. Occasional stable, small, definitively benign pulmonary nodules, for example a 6 mm nodule of the left lower lobe (series 9, image 94). Minimal paraseptal emphysema. Unchanged, mild, tubular bronchiectasis. Previously noted irregular peripheral interstitial opacity is not appreciated, possibly resolved partial atelectasis owing to improved lung volumes on current examination. No significant air trapping on expiratory phase imaging. No pleural effusion or pneumothorax.  Upper Abdomen: No acute abnormality.  Musculoskeletal: No chest wall mass or suspicious bone lesions identified.  IMPRESSION: 1. Previously noted irregular peripheral interstitial opacity at the lung bases is not appreciated, possibly resolved partial atelectasis owing to improved lung volumes on current examination. There is mild tubular bronchiectasis, nonspecific, without evidence of fibrotic interstitial lung disease. 2. Scattered tiny centrilobular nodules at the lung apices, most likely  smoking-related respiratory bronchiolitis. 3. Additional stable, small, definitively benign pulmonary nodules for which no further follow-up or characterization is required. 4. Minimal paraseptal emphysema. 5. Coronary artery disease.  Aortic Atherosclerosis (ICD10-I70.0) and Emphysema (ICD10-J43.9).   Electronically Signed   By: Eddie Candle M.D.   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   reports that he quit smoking about 33 years ago. His smoking use included cigarettes. He started smoking about 56 years ago. He has a 36.00 pack-year smoking history. He has never used smokeless tobacco.  Past Surgical History:  Procedure Laterality Date  . APPENDECTOMY  1984  . CATARACT EXTRACTION     L and R eye  . CHOLECYSTECTOMY  2002  . HYDROCELE EXCISION Right 11/04/2016   Procedure: HYDROCELECTOMY ADULT;  Surgeon: Franchot Gallo, MD;  Location: [facility];  Service: Urology;  Laterality: Right;  . INGUINAL HERNIA REPAIR Bilateral 11/04/2016   Procedure: HERNIA REPAIR INGUINAL ADULT BILATERAL;  Surgeon: Jackolyn Confer, MD;  Location: [facility];  Service: General;  Laterality: Bilateral;  . INSERTION OF MESH Bilateral 11/04/2016   Procedure: INSERTION OF MESH;  Surgeon: Jackolyn Confer, MD;  Location: [facility];  Service: General;  Laterality: Bilateral;  . JOINT REPLACEMENT     Lt hip  . LEFT HEART CATH AND CORONARY ANGIOGRAPHY N/A 10/31/2017   Procedure: LEFT HEART CATH AND CORONARY ANGIOGRAPHY;  Surgeon: Troy Sine, MD;  Location: [facility];  Service: Cardiovascular;  Laterality: N/A;  . MASTOIDECTOMY  1996  . NASAL SEPTUM SURGERY     sinus surgery  . PARATHYROIDECTOMY    . TOTAL HIP REVISION  06/14/2011   Procedure: TOTAL HIP REVISION;  Surgeon: Kerin Salen;  Location: [facility];  Service: Orthopedics;  Laterality: Left;  Left Acetabular  Hip Revision    Allergies  Allergen Reactions   . Bupropion Other (See Comments)    Caused altered mental status Altered mental status Caused altered mental status  . Ciprofloxacin-Dexamethasone Other (See Comments)    Burning in ears when Cipro ear drops were used unknown Burning in ears when Cipro ear drops were used  . Gabapentin Other (See Comments)    Caused weakness, dizziness, and forgetfulness  Weakness and dizziness with higher doses. Weakness and dizziness Caused weakness, dizziness, and forgetfulness  . Ciprofloxacin Hcl Other (See Comments)    Burning in ears when drops were used  . Clonidine Hcl Other (See Comments)    Immunization History  Administered Date(s) Administered  . H1N1 04/25/2008  . Influenza Split 04/25/2008, 04/08/2009, 04/03/2010, 04/02/2011, 04/18/2012, 04/27/2013, 04/05/2015, 03/30/2019  . Influenza, High Dose Seasonal PF 03/30/2016  . Influenza,inj,Quad PF,6+ Mos 04/02/2016, 03/03/2017, 04/28/2018, 04/04/2019, 03/20/2020  . Influenza,inj,quad, With Preservative 04/15/2015  .  PFIZER(Purple Top)SARS-COV-2 Vaccination 08/12/2019, 09/05/2019, 04/15/2020  . Pneumococcal Conjugate-13 05/08/2015  . Pneumococcal Polysaccharide-23 10/24/2007, 03/03/2017  . Td 03/26/2004  . Tdap 03/13/2013  . Zoster 03/13/2013    Family History  Problem Relation Age of Onset  . Lung cancer Father 54       smoked  . Heart Problems Father   . Cancer Brother        prostate  . Heart Problems Mother   . Neuropathy Sister   . Healthy Son   . Healthy Daughter   . Healthy Daughter      Current Outpatient Medications:  .  albuterol (VENTOLIN HFA) 108 (90 Base) MCG/ACT inhaler, Inhale 1-2 puffs into the lungs every 6 (six) hours as needed for wheezing or shortness of breath., Disp: 18 g, Rfl: 2 .  aspirin 81 MG chewable tablet, Chew 81 mg by mouth daily., Disp: , Rfl:  .  celecoxib (CELEBREX) 200 MG capsule, 1 capsule with food as needed, Disp: , Rfl:  .  famotidine (PEPCID) 20 MG tablet, Take 1 tablet (20 mg  total) by mouth at bedtime., Disp: 30 tablet, Rfl: 3 .  methylphenidate (RITALIN) 10 MG tablet, Take 10 mg by mouth 3 (three) times daily., Disp: , Rfl:  .  metoprolol succinate (TOPROL-XL) 25 MG 24 hr tablet, TAKE 1 TABLET(25 MG) BY MOUTH DAILY, Disp: 90 tablet, Rfl: 3 .  mirtazapine (REMERON) 15 MG tablet, Take 15 mg by mouth at bedtime., Disp: , Rfl:  .  Multiple Vitamin (MULTIVITAMIN) tablet, Take 1 tablet by mouth daily., Disp: , Rfl:  .  nitroGLYCERIN (NITROSTAT) 0.4 MG SL tablet, Place 0.4 mg under the tongue every 5 (five) minutes as needed for chest pain. , Disp: , Rfl:  .  NONFORMULARY OR COMPOUNDED ITEM, Antifungal solution: Terbinafine 3%, Fluconazole 2%, Tea Tree Oil 5%, Urea 10%, Ibuprofen 2% in DMSO suspension #79mL, Disp: 1 each, Rfl: 3 .  omeprazole (PRILOSEC) 20 MG capsule, Take 20 mg by mouth 2 (two) times daily before a meal., Disp: , Rfl:  .  polyethylene glycol (MIRALAX / GLYCOLAX) packet, Take 17 g by mouth daily as needed (for constipation.). , Disp: , Rfl:  .  rosuvastatin (CRESTOR) 10 MG tablet, TAKE 1 TABLET(10 MG) BY MOUTH DAILY, Disp: 90 tablet, Rfl: 3 .  SYNTHROID 25 MCG tablet, Take 25 mcg by mouth daily before breakfast. , Disp: , Rfl:  .  tamsulosin (FLOMAX) 0.4 MG CAPS capsule, Take 0.4 mg by mouth daily after breakfast. , Disp: , Rfl:  .  Tiotropium Bromide Monohydrate (SPIRIVA RESPIMAT) 2.5 MCG/ACT AERS, Inhale 2 puffs into the lungs daily., Disp: 4 g, Rfl: 0      Objective:   Vitals:   09/03/20 1130  BP: (!) 108/58  Pulse: 70  Temp: 98 F (36.7 C)  TempSrc: Temporal  SpO2: 98%  Weight: 181 lb 12.8 oz (82.5 kg)  Height: 5\' 10"  (1.778 m)    Estimated body mass index is 26.09 kg/m as calculated from the following:   Height as of this encounter: 5\' 10"  (1.778 m).   Weight as of this encounter: 181 lb 12.8 oz (82.5 kg).  @WEIGHTCHANGE @  [employer]   09/03/20 1130  Weight: 181 lb 12.8 oz (82.5 kg)     Physical Exam   General: No distress.  Looks well Neuro: Alert and Oriented x 3. GCS 15. Speech normal Psych: Pleasant Resp:  Barrel Chest - no.  Wheeze - no, Crackles - no, No overt respiratory distress  CVS: Normal heart sounds. Murmurs - no Ext: Stigmata of Connective Tissue Disease - no HEENT: Normal upper airway. PEERL +. No post nasal drip        Assessment:       ICD-10-CM   1. Shortness of breath  R06.02 Pulmonary function test    AMB referral to pulmonary rehabilitation  2. Centrilobular emphysema (HCC)  J43.2 Pulmonary function test    AMB referral to pulmonary rehabilitation  3. 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 Frequent episodes of bronchitis  J40        Plan:     Patient Instructions     ICD-10-CM   1. Shortness of breath  R06.02   2. Centrilobular emphysema ([place])  J43.2   3. 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 Frequent episodes of bronchitis  J40     Currently there is no evidence of pulmonary fibrosis  -Likely had viral infection in January 2020 that we are only aware of now and had pulmonary inflammation/fibrosis that then resolved  -Alternatively could have had some more related pulmonary inflammation from exposure at home and with cleaning of the home this is now resolved There is slight evidence of emphysema on CT scan of the chest  Mild amount of shortness of breath with exertion relieved by rest that could be multifactorial  Pulmonary function test this abnormal out of proportion to above findings  Overall he is some better however  Plan -Start Spiriva Respimat 2 puffs once daily and pretreatment -Referral pulmonary rehabilitation -Repeat full pulmonary function test in 3 to 4 months -Return sooner if needed -If symptoms and abnormal breathing test continue to persist then we can check for myasthenia gravis and/or consider pulmonary stress testing  Follow-up -3-4 months with [name] but after completing the above tests and rehabilitation   ( Level 05 visit: Estb 40-54  min  in  visit type: on-site physical face to visit  in total care time and counseling or/and coordination of care by this undersigned MD - Dr Brand Males. This includes one or more of the following on this same day 09/03/2020: pre-charting, chart review, note writing, documentation discussion of test results, diagnostic or treatment recommendations, prognosis, risks and benefits of management options, instructions, education, compliance or risk-factor reduction. It excludes time spent by the [place] or office staff in the care of the patient. Actual time 40 min. Documentation done on 09/04/20 excluded from this time)   SIGNATURE    Dr. Brand Males, M.D., F.C.C.P,  Pulmonary and Critical Care Medicine Staff Physician, [place] Director - Interstitial Lung Disease  Program  Pulmonary [place] [facility] [place], [place], [address]  Pager: [phone], If no answer or between  15:00h - 7:00h: call [phone] Telephone: [phone]  12:37 PM 09/03/2020

## 2020-09-08 ENCOUNTER — Other Ambulatory Visit: Payer: Self-pay

## 2020-09-08 ENCOUNTER — Ambulatory Visit (INDEPENDENT_AMBULATORY_CARE_PROVIDER_SITE_OTHER): Payer: Medicare Other | Admitting: Podiatry

## 2020-09-08 ENCOUNTER — Encounter: Payer: Self-pay | Admitting: Podiatry

## 2020-09-08 DIAGNOSIS — M79675 Pain in left toe(s): Secondary | ICD-10-CM | POA: Diagnosis not present

## 2020-09-08 DIAGNOSIS — B351 Tinea unguium: Secondary | ICD-10-CM | POA: Diagnosis not present

## 2020-09-08 DIAGNOSIS — M79674 Pain in right toe(s): Secondary | ICD-10-CM | POA: Diagnosis not present

## 2020-09-10 ENCOUNTER — Encounter (HOSPITAL_COMMUNITY): Payer: Self-pay | Admitting: *Deleted

## 2020-09-10 NOTE — Progress Notes (Signed)
Received referral from [name] for this pt to participate in pulmonary rehab with the the diagnosis of Centrilobular Emphysema. Clinical review of pt follow up appt on 3/2 Pulmonary office note. Pt completed 8 sessions in pulmonary rehab in 2021.  Pt was discharged due to an extended period of recovery from bronchitis.  Pt with Covid Risk Score - 5. Pt appropriate for scheduling for Pulmonary rehab.  Will forward to support staff for scheduling and verification of insurance eligibility/benefits with pt consent. Cherre Huger, BSN Cardiac and Training and development officer

## 2020-09-14 NOTE — Progress Notes (Signed)
Subjective: [name] presents todayfor complaint of painful thick toenails that are difficult to trim. Pain interferes with ambulation. Aggravating factors include wearing enclosed shoe gear. Pain is relieved with periodic professional debridement.   His wife is present during today's visit.   Toenails have been thick and discolored for some time and have become more difficult for him to manage.   PCP is [name] and lastvisit was 09/03/2020.  Allergies  Allergen Reactions  . Bupropion Other (See Comments)    Caused altered mental status Altered mental status Caused altered mental status  . Ciprofloxacin-Dexamethasone Other (See Comments)    Burning in ears when Cipro ear drops were used unknown Burning in ears when Cipro ear drops were used  . Gabapentin Other (See Comments)    Caused weakness, dizziness, and forgetfulness  Weakness and dizziness with higher doses. Weakness and dizziness Caused weakness, dizziness, and forgetfulness  . Ciprofloxacin Hcl Other (See Comments)    Burning in ears when drops were used  . Clonidine Hcl Other (See Comments)     Social History   Occupational History    Comment: Sales  Tobacco Use  . Smoking status: Former Smoker    Packs/day: 1.50    Years: 24.00    Pack years: 36.00    Types: Cigarettes    Start date: 1966    Quit date: 07/06/1987    Years since quitting: 33.2  . Smokeless tobacco: Never Used  Vaping Use  . Vaping Use: Never used  Substance and Sexual Activity  . Alcohol use: Yes    Comment: occ  . Drug use: No  . Sexual activity: Not on file    Objective: [name] is a pleasant 70 y.o. male WD, WN in NAD. AAO x 3.  There were no vitals filed for this visit.  Vascular Examination:  Capillary refill time to digits immediate b/l. Palpable pedal pulses b/l LE. Pedal hair sparse. Lower extremity skin temperature gradient within normal limits. No pain with calf compression b/l.  Dermatological  Examination: Pedal skin with normal turgor, texture and tone bilaterally. No open wounds bilaterally. No interdigital macerations bilaterally. Toenails 1-5 b/l elongated, discolored, dystrophic, thickened, crumbly with subungual debris and tenderness to dorsal palpation.  Musculoskeletal: Normal muscle strength 5/5 to all lower extremity muscle groups bilaterally. No pain crepitus or joint limitation noted with ROM b/l.  Neurological: Protective sensation intact 5/5 intact bilaterally with 10g monofilament b/l. Vibratory sensation intact b/l. Clonus negative b/l.  Assessment: 1. Pain due to onychomycosis of toenails of both feet     Plan: -Examined patient. -No new findings. No new orders. -Patient to continue soft, supportive shoe gear daily. -Patient instructed to continue topical antifungal solution to toenails once daily from [place] 1-5 b/l were debrided in length and girth with sterile nail nippers and dremel without iatrogenic bleeding.  -Patient to report any pedal injuries to medical professional immediately. -Patient/POA to call should there be question/concern in the interim.  Return in about 3 months (around 12/09/2020).  Marzetta Board, DPM

## 2020-10-02 ENCOUNTER — Telehealth (HOSPITAL_COMMUNITY): Payer: Self-pay

## 2020-10-02 NOTE — Telephone Encounter (Signed)
Called and spoke with pt wife Arville Go who stated pt is interested in [place]. Went over [employer], she verbalized understanding. Will contact patient for scheduling at a later date.

## 2020-10-02 NOTE — Telephone Encounter (Signed)
Pt insurance is active and benefits verified through Medicare A/B. Co-pay $0.00, DED $233.00/$233.00 met, out of pocket $0.00/$0.00 met, co-insurance 20%. No pre-authorization required.   2ndary insurance is active and benefits verified through [employer]. Co-pay $0.00, DED $0.00/$0.00 met, out of pocket $0.00/$0.00 met, co-insurance 0%. No pre-authorization required. 

## 2020-10-06 DIAGNOSIS — N3943 Post-void dribbling: Secondary | ICD-10-CM | POA: Diagnosis not present

## 2020-10-06 DIAGNOSIS — R351 Nocturia: Secondary | ICD-10-CM | POA: Diagnosis not present

## 2020-10-06 DIAGNOSIS — R1084 Generalized abdominal pain: Secondary | ICD-10-CM | POA: Diagnosis not present

## 2020-10-06 DIAGNOSIS — N401 Enlarged prostate with lower urinary tract symptoms: Secondary | ICD-10-CM | POA: Diagnosis not present

## 2020-10-07 ENCOUNTER — Encounter: Payer: Self-pay | Admitting: Neurology

## 2020-10-13 DIAGNOSIS — Z23 Encounter for immunization: Secondary | ICD-10-CM | POA: Diagnosis not present

## 2020-10-17 DIAGNOSIS — H7293 Unspecified perforation of tympanic membrane, bilateral: Secondary | ICD-10-CM | POA: Diagnosis not present

## 2020-10-17 DIAGNOSIS — H9212 Otorrhea, left ear: Secondary | ICD-10-CM | POA: Diagnosis not present

## 2020-10-17 DIAGNOSIS — H6121 Impacted cerumen, right ear: Secondary | ICD-10-CM | POA: Diagnosis not present

## 2020-11-05 DIAGNOSIS — R059 Cough, unspecified: Secondary | ICD-10-CM | POA: Diagnosis not present

## 2020-11-05 DIAGNOSIS — J441 Chronic obstructive pulmonary disease with (acute) exacerbation: Secondary | ICD-10-CM | POA: Diagnosis not present

## 2020-11-06 DIAGNOSIS — Z03818 Encounter for observation for suspected exposure to other biological agents ruled out: Secondary | ICD-10-CM | POA: Diagnosis not present

## 2020-11-06 DIAGNOSIS — R059 Cough, unspecified: Secondary | ICD-10-CM | POA: Diagnosis not present

## 2020-11-12 ENCOUNTER — Telehealth: Payer: Self-pay | Admitting: Internal Medicine

## 2020-11-12 ENCOUNTER — Ambulatory Visit: Payer: Medicare Other

## 2020-11-12 DIAGNOSIS — J479 Bronchiectasis, uncomplicated: Secondary | ICD-10-CM

## 2020-11-12 NOTE — Telephone Encounter (Signed)
He has this recurring and I ahv struggled to get a handle  Plan  - cxr tody or tomorrow and see an app too if possible

## 2020-11-12 NOTE — Telephone Encounter (Signed)
Spoke with the pt  He is c/o increased cough for the past 8 days  He states that the cough has been non prod, and there were a few days last wk felt "burning in chest" when he coughed He has not been wheezing, but has had occ chest tightness  He denies any increased SOB, f/c/s, body aches  He had to negative covid tests last wk- one was PCR  MR- pt asking if he could come in for cxr today Please advise, thanks

## 2020-11-12 NOTE — Telephone Encounter (Signed)
Spoke with the pt and notified of response per MR  He verbalized understanding  He is familiar with [name], but she had no openings until Friday 5/13 He prefers coming in for cxr tomorrow 5/12 and then appt following day based on cxr result  I placed the order for STAT cxr to be done tomorrow  Forwarding back to MR as FYI to keep an eye out for the results, thanks

## 2020-11-13 ENCOUNTER — Ambulatory Visit (INDEPENDENT_AMBULATORY_CARE_PROVIDER_SITE_OTHER): Payer: Medicare Other

## 2020-11-13 DIAGNOSIS — J479 Bronchiectasis, uncomplicated: Secondary | ICD-10-CM

## 2020-11-13 DIAGNOSIS — R059 Cough, unspecified: Secondary | ICD-10-CM | POA: Diagnosis not present

## 2020-11-13 NOTE — Telephone Encounter (Signed)
Called and spoke with patient, advised of results of cxr per MR.  Advised of recommendation to be seen by BW tomorrow, scheduled 10:30 am for 11/14/20.  Nothing further needed.

## 2020-11-13 NOTE — Telephone Encounter (Signed)
  cxr non revealing  Plan  - can he see BEth tomorrow 11/14/20  ?  [facility]  MR   DG Chest 2 View  Result Date: 11/13/2020 CLINICAL DATA:  Cough EXAM: CHEST - 2 VIEW COMPARISON:  07/18/2020 FINDINGS: Heart size is normal. Mediastinal contours are within normal limits. Continued prominence of the interstitium similar to prior study. No acute confluent opacities or effusions. No acute bony abnormality. IMPRESSION: Stable chronic interstitial prominence.  No active disease. Electronically Signed   By: Rolm Baptise M.D.   On: 11/13/2020 09:25     IMPRESSION: 1. Previously noted irregular peripheral interstitial opacity at the lung bases is not appreciated, possibly resolved partial atelectasis owing to improved lung volumes on current examination. There is mild tubular bronchiectasis, nonspecific, without evidence of fibrotic interstitial lung disease. 2. Scattered tiny centrilobular nodules at the lung apices, most likely smoking-related respiratory bronchiolitis. 3. Additional stable, small, definitively benign pulmonary nodules for which no further follow-up or characterization is required. 4. Minimal paraseptal emphysema. 5. Coronary artery disease.  Aortic Atherosclerosis (ICD10-I70.0) and Emphysema (ICD10-J43.9).   Electronically Signed   By: Eddie Candle M.D.   On: 08/16/2020 16:19

## 2020-11-14 ENCOUNTER — Ambulatory Visit (INDEPENDENT_AMBULATORY_CARE_PROVIDER_SITE_OTHER): Payer: Medicare Other | Admitting: Primary Care

## 2020-11-14 ENCOUNTER — Other Ambulatory Visit: Payer: Self-pay

## 2020-11-14 ENCOUNTER — Other Ambulatory Visit: Payer: Self-pay | Admitting: Primary Care

## 2020-11-14 ENCOUNTER — Encounter: Payer: Self-pay | Admitting: Primary Care

## 2020-11-14 VITALS — BP 118/72 | HR 78 | Temp 97.8°F | Ht 71.5 in | Wt 176.8 lb

## 2020-11-14 DIAGNOSIS — J479 Bronchiectasis, uncomplicated: Secondary | ICD-10-CM | POA: Diagnosis not present

## 2020-11-14 DIAGNOSIS — J471 Bronchiectasis with (acute) exacerbation: Secondary | ICD-10-CM

## 2020-11-14 DIAGNOSIS — J432 Centrilobular emphysema: Secondary | ICD-10-CM

## 2020-11-14 DIAGNOSIS — M2550 Pain in unspecified joint: Secondary | ICD-10-CM | POA: Diagnosis not present

## 2020-11-14 DIAGNOSIS — J309 Allergic rhinitis, unspecified: Secondary | ICD-10-CM

## 2020-11-14 DIAGNOSIS — R768 Other specified abnormal immunological findings in serum: Secondary | ICD-10-CM

## 2020-11-14 MED ORDER — DOXYCYCLINE HYCLATE 100 MG PO TABS: 100.0000 mg | ORAL_TABLET | Freq: Two times a day (BID) | ORAL | 0 refills | Status: DC

## 2020-11-14 NOTE — Patient Instructions (Addendum)
Recommendations: Start Flonase nasal spray (over the counter) Continue mucinex 600 twice daily (over the counter) Start flutter valve three times a day  Only use Spiriva once a day in the morning  We will send in Albtuerol rescue inhaler - you can use this every 6 hours  Please call Dr. Bo Merino to make follow-up visit d/t arthritic pain   Rx: Doxycycline 100mg  BID x 10 days   Follow-up  3-4 months with DR. Ramaswamy

## 2020-11-14 NOTE — Progress Notes (Signed)
@Patient  ID: [name], male    DOB: [birth year], 70 y.o.   MRN: [id number]  Chief Complaint  Patient presents with  . Follow-up    Reports still having concern with no productive cough    Referring provider: Jonathon Jordan, MD  HPI: 70 year old male, former smoker quit in 1989 (36-pack-year history).  Significant for interstitial lung disease, bronchiectasis, COPD/emphysema, chronic respiratory failure with hypoxia.  Patient of Dr. Chase Gomez.   Previous [facility] pulmonary encounter: 03/25/2020  - Visit  70 year old male former smoker followed in our office for chronic respiratory failure and COPD.  Followed by Dr. Chase Gomez patient was last seen in our office on 02/13/2020.  He was seen by TP NP at that office visit is recommended the patient be treated with Augmentin, prednisone, add Yupelri nebulized medication, obtain pulmonary function testing.   Patient reports that he is feeling significantly better since last being treated in August/2021.  He feels he is back to his baseline.  He is not still currently using Yupelri.  He is unsure if this actually helped him.  He reports it is difficult for him to assess given the fact that he was being acutely treated as a COPD exacerbation while he was trialed on it.  Patient would like to review most recent pulmonary function test today.  He reports that most recent plan as discussed with him by Dr. Chase Gomez was to repeat a CT scan next year to follow the February/2021 HRCT.   08/05/2020  Patient contacted today to review recent PFTs. He is doing well today. He had a cough x1 month with associated wheezing for 5 days. His cough has mostly resolved. He still has some shortness of breath. He stopped using yupeli. He did not find this helpful. In the past insurance has not covered Symbicort and is not covering Albuterol. Pulmonary function testing appears stable. FENO was mildly elevated at 30. He has a follow-up HRCT in February.   09/03/20- Dr.  Arlyce Gomez 70 y.o. -presents for follow-up.  He is feeling somewhat better as evidenced by the symptom score below.  However in January he felt quite ill with significant respiratory complaints.  Significant amount of phone calls.  Visits to the ER.  His Covid test was negative but it was in the middle of omicron pandemic.  He had CT angiogram in the ER and ruled out pulmonary embolism.  He had follow-up high-resolution CT chest in February 2022 and is previous ILD that he is very concerned about even though was early/mild has resolved.  I personally visualized the 2 scans and do agree with the radiologist.  I also showed them the scans.  There is only slight evidence of emphysema.  However despite all this improvement it seems that his pulmonary function test is worse.  Do not know with the technique issue.  He still does have some residual shortness of breath however.  Talking to him he tells me that back in January 2020 before his problem started he had viral infection.  He believes it was COVID-19.  Although COVID-19 officially was not in [place] in January 2020.  He believes he had a positive test but I do not see records of this.  In any event he is better now.  Also there was some mold exposure in his house he is got this cleaned up.  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   11/14/2020- Interim hx  Patient presents today for 8 week follow-up. He has had a cough for 2 weeks. He reports a pattern of having a cyclic cough every 10 days. Cough is mostly dry with occasional thick clear mucus. Last week he was getting up purulent mucus. He was treated with azithromycin on 11/05/20. He has some nasal congestion which he takes mucinex for. CXR yesterday showed stable chronic interstitial prominence. He has arthritic pain in his hands. RF has been elevated in the past and he saw Dr. Estanislado Gomez 1-2 years ago. HRCT in February 2022 did not  show evidence of ILD. It did show mild tubular bronchiectasis and mild paraseptal emphysema.    Allergies  Allergen Reactions  . Bupropion Other (See Comments)    Caused altered mental status Altered mental status Caused altered mental status  . Ciprofloxacin-Dexamethasone Other (See Comments)    Burning in ears when Cipro ear drops were used unknown Burning in ears when Cipro ear drops were used  . Gabapentin Other (See Comments)    Caused weakness, dizziness, and forgetfulness  Weakness and dizziness with higher doses. Weakness and dizziness Caused weakness, dizziness, and forgetfulness  . Ciprofloxacin Hcl Other (See Comments)    Burning in ears when drops were used  . Clonidine Hcl Other (See Comments)    Immunization History  Administered Date(s) Administered  . H1N1 04/25/2008  . Influenza Split 04/25/2008, 04/08/2009, 04/03/2010, 04/02/2011, 04/18/2012, 04/27/2013, 04/05/2015, 03/30/2019  . Influenza, High Dose Seasonal PF 03/30/2016  . Influenza,inj,Quad PF,6+ Mos 04/02/2016, 03/03/2017, 04/28/2018, 04/04/2019, 03/20/2020  . Influenza,inj,quad, With Preservative 04/15/2015  . PFIZER(Purple Top)SARS-COV-2 Vaccination 08/12/2019, 09/05/2019, 04/15/2020, 10/13/2020  . Pneumococcal Conjugate-13 05/08/2015  . Pneumococcal Polysaccharide-23 10/24/2007, 03/03/2017  . Td 03/26/2004  . Tdap 03/13/2013  . Zoster 03/13/2013    Past Medical History:  Diagnosis Date  . Arthritis   . Bronchitis    history of  . 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 GERD (gastroesophageal reflux disease)   . Hiatal hernia   . 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 History of echocardiogram    Echo 11/2019: EF 65-70, no R WMA, mild concentric LVH, normal RV SF, normal PASP (RVSP 19.2 mmHg), trivial MR  . Hypercalcemia   . Lipoma    left forearm (3)  . Malignant melanoma of skin ([place]) 06/23/2020  . Mitral valve prolapse    does not see cardiologist for. last stress test 2003  .  Pneumonia 2009  . Tumor cells, benign    lung, left side  . Unstable angina ([place]) 10/28/2017    Tobacco History: Social History   Tobacco Use  Smoking Status Former Smoker  . Packs/day: 1.50  . Years: 24.00  . Pack years: 36.00  . Types: Cigarettes  . Start date: 1966  . Quit date: 07/06/1987  . Years since quitting: 33.3  Smokeless Tobacco Never Used   Counseling given: Not Answered   Outpatient Medications Prior to Visit  Medication Sig Dispense Refill  . aspirin 81 MG chewable tablet Chew 81 mg by mouth daily.    . celecoxib (CELEBREX) 200 MG capsule 1 capsule with food as needed    . famotidine (PEPCID) 20 MG tablet Take 1 tablet (20 mg total) by mouth at bedtime. 30 tablet 3  . methylphenidate (RITALIN) 10 MG tablet Take 10 mg by mouth 3 (three) times daily.    . metoprolol succinate (TOPROL-XL) 25 MG 24 hr tablet TAKE 1 TABLET(25 MG) BY MOUTH DAILY 90 tablet 3  .  mirtazapine (REMERON) 15 MG tablet Take 15 mg by mouth at bedtime.    . Multiple Vitamin (MULTIVITAMIN) tablet Take 1 tablet by mouth daily.    . nitroGLYCERIN (NITROSTAT) 0.4 MG SL tablet Place 0.4 mg under the tongue every 5 (five) minutes as needed for chest pain.     . NONFORMULARY OR COMPOUNDED ITEM Antifungal solution: Terbinafine 3%, Fluconazole 2%, Tea Tree Oil 5%, Urea 10%, Ibuprofen 2% in DMSO suspension #23mL 1 each 3  . omeprazole (PRILOSEC) 20 MG capsule Take 20 mg by mouth 2 (two) times daily before a meal.    . polyethylene glycol (MIRALAX / GLYCOLAX) packet Take 17 g by mouth daily as needed (for constipation.).     Marland Kitchen rosuvastatin (CRESTOR) 10 MG tablet TAKE 1 TABLET(10 MG) BY MOUTH DAILY 90 tablet 3  . SYNTHROID 25 MCG tablet Take 25 mcg by mouth daily before breakfast.     . tamsulosin (FLOMAX) 0.4 MG CAPS capsule Take 0.4 mg by mouth daily after breakfast.     . Tiotropium Bromide Monohydrate (SPIRIVA RESPIMAT) 2.5 MCG/ACT AERS Inhale 2 puffs into the lungs daily. 4 g 0  . albuterol (VENTOLIN  HFA) 108 (90 Base) MCG/ACT inhaler Inhale 1-2 puffs into the lungs every 6 (six) hours as needed for wheezing or shortness of breath. (Patient not taking: Reported on 11/14/2020) 18 g 2   No facility-administered medications prior to visit.    Review of Systems  Review of Systems  Constitutional: Negative.   HENT: Positive for congestion.   Respiratory: Positive for cough. Negative for chest tightness, shortness of breath and wheezing.    Physical Exam  BP 118/72 (BP Location: Right Arm, Cuff Size: Normal)   Pulse 78   Temp 97.8 F (36.6 C) (Temporal)   Ht 5' 11.5" (1.816 m)   Wt 176 lb 12.8 oz (80.2 kg)   SpO2 98% Comment: RA  BMI 24.31 kg/m  Physical Exam Constitutional:      General: He is not in acute distress.    Appearance: Normal appearance. He is not ill-appearing.  HENT:     Head: Normocephalic and atraumatic.  Cardiovascular:     Rate and Rhythm: Normal rate and regular rhythm.  Pulmonary:     Effort: Pulmonary effort is normal.     Comments: Rhonchi right lung base  Neurological:     Mental Status: He is alert.  Psychiatric:        Mood and Affect: Mood normal.        Thought Content: Thought content normal.        Judgment: Judgment normal.      Lab Results:  CBC    Component Value Date/Time   WBC 6.4 07/18/2020 1146   RBC 4.68 07/18/2020 1146   HGB 15.0 07/18/2020 1146   HGB 15.4 08/17/2019 1517   HCT 43.3 07/18/2020 1146   PLT 174.0 07/18/2020 1146   PLT 112 (L) 08/17/2019 1517   MCV 92.5 07/18/2020 1146   MCH 30.5 08/17/2019 1517   MCHC 34.6 07/18/2020 1146   RDW 13.2 07/18/2020 1146   LYMPHSABS 1.8 07/18/2020 1146   MONOABS 0.6 07/18/2020 1146   EOSABS 0.2 07/18/2020 1146   BASOSABS 0.0 07/18/2020 1146    BMET    Component Value Date/Time   NA 142 07/18/2020 1146   NA 142 02/13/2018 0851   K 4.2 07/18/2020 1146   CL 104 07/18/2020 1146   CO2 29 07/18/2020 1146   GLUCOSE 90 07/18/2020 1146  BUN 22 07/18/2020 1146   BUN 16  02/13/2018 0851   CREATININE 1.03 07/18/2020 1146   CREATININE 1.01 08/17/2019 1517   CALCIUM 9.0 07/18/2020 1146   CALCIUM 9.0 02/19/2011 1233   GFRNONAA >60 08/17/2019 1517   GFRAA >60 08/17/2019 1517    BNP    Component Value Date/Time   BNP 42.2 06/19/2019 0902    ProBNP    Component Value Date/Time   PROBNP 57.0 07/18/2020 1146    Imaging: DG Chest 2 View  Result Date: 11/13/2020 CLINICAL DATA:  Cough EXAM: CHEST - 2 VIEW COMPARISON:  07/18/2020 FINDINGS: Heart size is normal. Mediastinal contours are within normal limits. Continued prominence of the interstitium similar to prior study. No acute confluent opacities or effusions. No acute bony abnormality. IMPRESSION: Stable chronic interstitial prominence.  No active disease. Electronically Signed   By: Rolm Baptise M.D.   On: 11/13/2020 09:25     Assessment & Plan:   Bronchiectasis with (acute) exacerbation ([place])  - Patient has had a cough for 2 weeks, occasionally productive with thick clear mucus.  HRCT in February 2022 did not show evidence of ILD. It did show mild tubular bronchiectasis and mild paraseptal emphysema. Continue mucinex 600 twice daily. Start using Flutter valve three times a day. Sending in RX doxycycline 100mg  BID x 10 days.  Centrilobular emphysema ([place]) - Advised patient to only use Spiriva Respimat 2.9mcg once a day in the morning  - He can use Albtuerol rescue inhaler 2 puffs every 6 hours for breakthrough shortness of breath/wheezing  Allergic rhinitis - Recommend starting flonase nasal spray for sinusitis symptoms that could be contributing to cough  Elevated rheumatoid factor - Reports arthritic pain in his hands, hx elevated rheumatoid factor. Rechecking RF lab valve and recommend patient follow-up with Dr. Rachael Darby, NP 11/17/2020

## 2020-11-15 LAB — CLIENT EDUCATION TRACKING

## 2020-11-15 LAB — RHEUMATOID FACTOR: Rheumatoid fact SerPl-aCnc: 372 IU/mL — ABNORMAL HIGH (ref <14)

## 2020-11-17 NOTE — Assessment & Plan Note (Addendum)
-   Patient has had a cough for 2 weeks, occasionally productive with thick clear mucus.  HRCT in February 2022 did not show evidence of ILD. It did show mild tubular bronchiectasis and mild paraseptal emphysema. Continue mucinex 600 twice daily. Start using Flutter valve three times a day. Sending in RX doxycycline 100mg  BID x 10 days.

## 2020-11-17 NOTE — Assessment & Plan Note (Addendum)
-   Advised patient to only use Spiriva Respimat 2.61mcg once a day in the morning  - He can use Albtuerol rescue inhaler 2 puffs every 6 hours for breakthrough shortness of breath/wheezing

## 2020-11-17 NOTE — Assessment & Plan Note (Signed)
-   Reports arthritic pain in his hands, hx elevated rheumatoid factor. Rechecking RF lab valve and recommend patient follow-up with [name]

## 2020-11-17 NOTE — Assessment & Plan Note (Addendum)
-   Recommend starting flonase nasal spray for sinusitis symptoms that could be contributing to cough

## 2020-11-18 ENCOUNTER — Telehealth: Payer: Self-pay | Admitting: Primary Care

## 2020-11-18 DIAGNOSIS — R768 Other specified abnormal immunological findings in serum: Secondary | ICD-10-CM

## 2020-11-18 NOTE — Progress Notes (Signed)
Please let patient know RF was elevated at 372. Follow up with rheumatology, he is already established

## 2020-11-18 NOTE — Telephone Encounter (Signed)
Richard Ehrich, NP  11/18/2020 10:57 AM EDT      Please let patient know RF was elevated at 372. Follow up with rheumatology, he is already established    Spoke to patient, who stated that he contacted Dr. Arlean Hopping office for an appointment and was told that a referral was needed as he was last seen over a year ago. Referral has been placed. Nothing further needed at this time.

## 2020-11-20 ENCOUNTER — Encounter (HOSPITAL_COMMUNITY): Payer: Self-pay

## 2020-11-20 ENCOUNTER — Telehealth (HOSPITAL_COMMUNITY): Payer: Self-pay

## 2020-11-20 DIAGNOSIS — M792 Neuralgia and neuritis, unspecified: Secondary | ICD-10-CM | POA: Diagnosis not present

## 2020-11-20 DIAGNOSIS — Z125 Encounter for screening for malignant neoplasm of prostate: Secondary | ICD-10-CM | POA: Diagnosis not present

## 2020-11-20 DIAGNOSIS — Z0000 Encounter for general adult medical examination without abnormal findings: Secondary | ICD-10-CM | POA: Diagnosis not present

## 2020-11-20 DIAGNOSIS — R911 Solitary pulmonary nodule: Secondary | ICD-10-CM | POA: Diagnosis not present

## 2020-11-20 DIAGNOSIS — F322 Major depressive disorder, single episode, severe without psychotic features: Secondary | ICD-10-CM | POA: Diagnosis not present

## 2020-11-20 DIAGNOSIS — I679 Cerebrovascular disease, unspecified: Secondary | ICD-10-CM | POA: Diagnosis not present

## 2020-11-20 DIAGNOSIS — Z79899 Other long term (current) drug therapy: Secondary | ICD-10-CM | POA: Diagnosis not present

## 2020-11-20 DIAGNOSIS — J449 Chronic obstructive pulmonary disease, unspecified: Secondary | ICD-10-CM | POA: Diagnosis not present

## 2020-11-20 DIAGNOSIS — N281 Cyst of kidney, acquired: Secondary | ICD-10-CM | POA: Diagnosis not present

## 2020-11-20 DIAGNOSIS — E039 Hypothyroidism, unspecified: Secondary | ICD-10-CM | POA: Diagnosis not present

## 2020-11-20 DIAGNOSIS — E785 Hyperlipidemia, unspecified: Secondary | ICD-10-CM | POA: Diagnosis not present

## 2020-11-20 DIAGNOSIS — E210 Primary hyperparathyroidism: Secondary | ICD-10-CM | POA: Diagnosis not present

## 2020-11-20 NOTE — Telephone Encounter (Signed)
Attempted to call patient in regards to Pulmonary Rehab - LM on VM Mailed letter 

## 2020-11-24 ENCOUNTER — Telehealth: Payer: Self-pay | Admitting: Internal Medicine

## 2020-11-24 MED ORDER — BENZONATATE 100 MG PO CAPS: 200.0000 mg | ORAL_CAPSULE | Freq: Three times a day (TID) | ORAL | 1 refills | Status: DC | PRN

## 2020-11-24 NOTE — Telephone Encounter (Signed)
Spoke with the pt  He states has 1 day remaining on his doxy rx  He says that his cough has not improved at all  He is having coughing "spasms" 10-15 times per day where he can not stop coughing  Cough is non prod  He is not having any wheezing, SOB, chest tightness, f/c/s  He is taking his spiriva, flonase, delsym, mucinex and albuterol inhaler about 2 x per day  Please advise any recs, thanks!  Allergies  Allergen Reactions  . Bupropion Other (See Comments)    Caused altered mental status Altered mental status Caused altered mental status  . Ciprofloxacin-Dexamethasone Other (See Comments)    Burning in ears when Cipro ear drops were used unknown Burning in ears when Cipro ear drops were used  . Gabapentin Other (See Comments)    Caused weakness, dizziness, and forgetfulness  Weakness and dizziness with higher doses. Weakness and dizziness Caused weakness, dizziness, and forgetfulness  . Ciprofloxacin Hcl Other (See Comments)    Burning in ears when drops were used  . Clonidine Hcl Other (See Comments)

## 2020-11-24 NOTE — Telephone Encounter (Signed)
Spoke with wife [name] (per [facility]) and reviewed Dr. Golden Pop recommendations. [name] confirmed pharmacy and medication order was placed. Nothing further needed at this time.

## 2020-11-24 NOTE — Telephone Encounter (Signed)
Has he tried any Tessalon cough perles?.  If not he can try 200 mg 3 times daily as needed.  Can do 30-day supply with 1 refill

## 2020-11-26 ENCOUNTER — Telehealth (INDEPENDENT_AMBULATORY_CARE_PROVIDER_SITE_OTHER): Payer: Medicare Other | Admitting: Primary Care

## 2020-11-26 ENCOUNTER — Telehealth: Payer: Self-pay | Admitting: Internal Medicine

## 2020-11-26 ENCOUNTER — Other Ambulatory Visit: Payer: Self-pay

## 2020-11-26 DIAGNOSIS — J439 Emphysema, unspecified: Secondary | ICD-10-CM | POA: Diagnosis not present

## 2020-11-26 DIAGNOSIS — R053 Chronic cough: Secondary | ICD-10-CM | POA: Diagnosis not present

## 2020-11-26 MED ORDER — PREDNISONE 10 MG PO TABS: ORAL_TABLET | ORAL | 0 refills | Status: DC

## 2020-11-26 NOTE — Patient Instructions (Addendum)
CT Imaging in February showed minimal paraseptal emphysema. No evidence of fibrotic lung disease on CT imaging. Referred to rheumatology for arthralgia   Pulmonary function testing in the past has shown mild-moderate restriction. No post bronchodilator was done with your PFTs, I suspect you may have an asthmatic component with your cough as you response to Albuterol and hopefully steriods  Sending in prednisone taper 40mg  x 3 days; 30mg  x 3 days; 20mg  x 3 days; 10mg  x 3 days  Call insurance and ask what ICS/LABA inhaler is formulary on plan (examples/ Advair, [place], Symbicort)  Follow-up: Recall is in for follow-up with [name] PFTs prior

## 2020-11-26 NOTE — Telephone Encounter (Signed)
Primary Pulmonologist: Ramaswamy Last office visit and with whom: 11/14/20 BW  What do we see them for (pulmonary problems): Arthralgia, unspecified joint   Bronchiectasis without complication (HCC)   Bronchiectasis with (acute) exacerbation (HCC)   Centrilobular emphysema (HCC)   Allergic rhinitis, unspecified seasonality, unspecified trigger   Elevated rheumatoid factor  Last OV assessment/plan: Bronchiectasis with (acute) exacerbation ([place])  - Patient has had a cough for 2 weeks, occasionally productive with thick clear mucus.  HRCT in February 2022 did not show evidence of ILD. It did show mild tubular bronchiectasis and mild paraseptal emphysema. Continue mucinex 600 twice daily. Start using Flutter valve three times a day. Sending in RX doxycycline 100mg  BID x 10 days.  Centrilobular emphysema ([place]) - Advised patient to only use Spiriva Respimat 2.48mcg once a day in the morning  - He can use Albtuerol rescue inhaler 2 puffs every 6 hours for breakthrough shortness of breath/wheezing  Allergic rhinitis - Recommend starting flonase nasal spray for sinusitis symptoms that could be contributing to cough  Elevated rheumatoid factor - Reports arthritic pain in his hands, hx elevated rheumatoid factor. Rechecking RF lab valve and recommend patient follow-up with [name]   Was appointment offered to patient (explain)?  Yes pt was scheduled with BW at 11 today.   Reason for call: Spoke with pt's wife (per DPR) who state's pt cough/wheezing/ SOB has increased since starting Tessalon pearls yesterday. Pt's wife did state Delsym did provide a small amount of relief. Pt's wife state pt has been sick for 21+ days and has a negative Covid test during that time. Pt's wife denies N/V/D/F/D. Pt has completed 1 z-pak and a course of Doxycyline since being sick.     Allergies  Allergen Reactions  . Bupropion Other (See Comments)    Caused altered mental status Altered mental  status Caused altered mental status  . Ciprofloxacin-Dexamethasone Other (See Comments)    Burning in ears when Cipro ear drops were used unknown Burning in ears when Cipro ear drops were used  . Gabapentin Other (See Comments)    Caused weakness, dizziness, and forgetfulness  Weakness and dizziness with higher doses. Weakness and dizziness Caused weakness, dizziness, and forgetfulness  . Ciprofloxacin Hcl Other (See Comments)    Burning in ears when drops were used  . Clonidine Hcl Other (See Comments)    Immunization History  Administered Date(s) Administered  . H1N1 04/25/2008  . Influenza Split 04/25/2008, 04/08/2009, 04/03/2010, 04/02/2011, 04/18/2012, 04/27/2013, 04/05/2015, 03/30/2019  . Influenza, High Dose Seasonal PF 03/30/2016  . Influenza,inj,Quad PF,6+ Mos 04/02/2016, 03/03/2017, 04/28/2018, 04/04/2019, 03/20/2020  . Influenza,inj,quad, With Preservative 04/15/2015  . PFIZER(Purple Top)SARS-COV-2 Vaccination 08/12/2019, 09/05/2019, 04/15/2020, 10/13/2020  . Pneumococcal Conjugate-13 05/08/2015  . Pneumococcal Polysaccharide-23 10/24/2007, 03/03/2017  . Td 03/26/2004  . Tdap 03/13/2013  . Zoster 03/13/2013   Routing to [place] as [place].

## 2020-11-26 NOTE — Progress Notes (Signed)
Virtual Visit via Telephone Note  I connected with [name] on 11/26/20 at 11:00 AM EDT by telephone and verified that I am speaking with the correct person using two identifiers.  Location: Patient: Home Provider: Office    I discussed the limitations, risks, security and privacy concerns of performing an evaluation and management service by telephone and the availability of in person appointments. I also discussed with the patient that there may be a patient responsible charge related to this service. The patient expressed understanding and agreed to proceed.   History of Present Illness: 70 year old male, former smoker quit in 1989 (36-pack-year history).  Significant for interstitial lung disease, bronchiectasis, COPD/emphysema, chronic respiratory failure with hypoxia.  Patient of [name].   Previous [facility] pulmonary encounter: 03/25/2020  - Visit  former smoker followed in our office for chronic respiratory failure and COPD.  Followed by [name] patient was last seen in our office on 02/13/2020.  He was seen by TP NP at that office visit is recommended the patient be treated with Augmentin, prednisone, add Yupelri nebulized medication, obtain pulmonary function testing.   Patient reports that he is feeling significantly better since last being treated in August/2021.  He feels he is back to his baseline.  He is not still currently using Yupelri.  He is unsure if this actually helped him.  He reports it is difficult for him to assess given the fact that he was being acutely treated as a COPD exacerbation while he was trialed on it.  Patient would like to review most recent pulmonary function test today.  He reports that most recent plan as discussed with him by [name] was to repeat a CT scan next year to follow the February/2021 HRCT.   08/05/2020  Patient contacted today to review recent PFTs. He is doing well today. He had a cough x1 month with associated wheezing for 5  days. His cough has mostly resolved. He still has some shortness of breath. He stopped using yupeli. He did not find this helpful. In the past insurance has not covered Symbicort and is not covering Albuterol. Pulmonary function testing appears stable. FENO was mildly elevated at 30. He has a follow-up HRCT in February.   09/03/20- [name] 70 y.o. -presents for follow-up.  He is feeling somewhat better as evidenced by the symptom score below.  However in January he felt quite ill with significant respiratory complaints.  Significant amount of phone calls.  Visits to the ER.  His Covid test was negative but it was in the middle of omicron pandemic.  He had CT angiogram in the ER and ruled out pulmonary embolism.  He had follow-up high-resolution CT chest in February 2022 and is previous ILD that he is very concerned about even though was early/mild has resolved.  I personally visualized the 2 scans and do agree with the radiologist.  I also showed them the scans.  There is only slight evidence of emphysema.  However despite all this improvement it seems that his pulmonary function test is worse.  Do not know with the technique issue.  He still does have some residual shortness of breath however.  Talking to him he tells me that back in January 2020 before his problem started he had viral infection.  He believes it was COVID-19.  Although COVID-19 officially was not in [place] in January 2020.  He believes he had a positive test but I do not see  records of this.  In any event he is better now.  Also there was some mold exposure in his house he is got this cleaned up.  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  11/14/2020- 8 week FU, Volanda Napoleon NP Patient presents today for 8 week follow-up. He has had a cough for 2 weeks. He reports a pattern of having a cyclic cough every 10 days. Cough is mostly dry with occasional thick clear mucus.  Last week he was getting up purulent mucus. He was treated with azithromycin on 11/05/20. He has some nasal congestion which he takes mucinex for. CXR yesterday showed stable chronic interstitial prominence. He has arthritic pain in his hands. RF has been elevated in the past and he saw [name] 1-2 years ago. HRCT in February 2022 did not show evidence of ILD. It did show mild tubular bronchiectasis and mild paraseptal emphysema.    11/26/2020-  Acute televisit Patient was contacted today for acute visit. He continues to have a dry cough which worsened with tessalon perles. He is experiencing coughing spasms.  Associated fatigue and weakness. He denies aspiration. His wife had bronchitis. He has had 4 negative covid tests. He is compliant with Spiriva and Ventolin. CXR on 11/13/20 showed chronic interstitial prominence. PFTs in the past have shown mild-moderate restriction. No post BD done. Denies fever, chills or sweats.    Observations/Objective:  - Unable to speak in full sentences without coughing. No overt shortness of breath   Assessment and Plan:  Chronic cough: - Continues to have fair amount of coughing fits. Completed 10 day course of Doxycyline earlier this month for bronchitis symptoms without much improvement. He does response to Albuterol hfa - CXR on 11/13/20 showed chronic interstitial prominence  - Sending in prednisone taper 40mg  x 3 days; 30mg  x 3 days; 20mg  x 3 days; 10mg  x 3 days   COPD/Emphysema: - CT Imaging in February 2022 showed minimal paraseptal emphysema.  - Spirometry consistent with mild-moderate restriction. Suspect possible asthmatic component, will need full PFTs with post bronchodilator  - Continue Spiriva respimat 2.5 two puffs once daily in am  - Consider adding ICS/LABA   Elevated rheumatoid factor: - No evidence of fibrotic interstitial lung disease on CT imaging Feb 2022  - Referred to rheumatology for arthralgia    Follow Up Instructions:   - MR  with PFTs in 3 months or sooner if needed    I discussed the assessment and treatment plan with the patient. The patient was provided an opportunity to ask questions and all were answered. The patient agreed with the plan and demonstrated an understanding of the instructions.   The patient was advised to call back or seek an in-person evaluation if the symptoms worsen or if the condition fails to improve as anticipated.  I provided 26 minutes of non-face-to-face time during this encounter.   Martyn Ehrich, NP

## 2020-11-29 NOTE — Progress Notes (Signed)
Office Visit Note  Patient: [name]             Date of Birth: 1951/02/10           MRN: [id number]             PCP: Jonathon Jordan, MD Referring: Jonathon Jordan, MD Visit Date: 12/08/2020 Occupation: @GUAROCC @  Subjective:  Stiffness in both hands.   History of Present Illness: [name] is a 70 y.o. male with a history of positive rheumatoid factor and osteoarthritis.  He continues to have stiffness in his hands and his feet.  He has not noticed any swelling.  He has difficulty holding objects and gripping objects.  He is to play guitar and has difficulty playing guitar now.  Activities of Daily Living:  Patient reports morning stiffness for all day in bilateral hands. Patient Denies nocturnal pain.  Difficulty dressing/grooming: Denies Difficulty climbing stairs: Denies Difficulty getting out of chair: Denies Difficulty using hands for taps, buttons, cutlery, and/or writing: Reports  Review of Systems  Constitutional: Negative for fatigue.  HENT: Negative for mouth sores, mouth dryness and nose dryness.   Eyes: Negative for pain, itching and dryness.  Respiratory: Positive for cough and shortness of breath. Negative for difficulty breathing.   Cardiovascular: Negative for chest pain and palpitations.  Gastrointestinal: Positive for constipation and diarrhea. Negative for blood in stool.  Endocrine: Negative for increased urination.  Genitourinary: Negative for difficulty urinating.  Musculoskeletal: Positive for arthralgias, joint pain, muscle weakness and morning stiffness. Negative for joint swelling, myalgias, muscle tenderness and myalgias.  Skin: Negative for color change, rash and redness.  Allergic/Immunologic: Negative for susceptible to infections.  Neurological: Positive for headaches and weakness. Negative for dizziness, numbness and memory loss.  Hematological: Positive for bruising/bleeding tendency.  Psychiatric/Behavioral: Negative for  confusion.    PMFS History:  Patient Active Problem List   Diagnosis Date Noted  . Cognitive change 08/05/2020  . Acquired deformity of neck 06/23/2020  . Allergic rhinitis 06/23/2020  . Amnesia 06/23/2020  . Cataract 06/23/2020  . Cerebrovascular disease 06/23/2020  . Closed fracture of one rib 06/23/2020  . Coronary artery disease 06/23/2020  . Dementia with behavioral disturbance ([place]) 06/23/2020  . Dysgeusia 06/23/2020  . Extrapyramidal and movement disorder, unspecified 06/23/2020  . Gallstones 06/23/2020  . Generalized lymphadenopathy 06/23/2020  . Hardening of the aorta (main artery of the heart) ([place]) 06/23/2020  . Herpes simplex viral infection 06/23/2020  . Hypothyroid 06/23/2020  . Insomnia 06/23/2020  . Malignant melanoma of skin ([place]) 06/23/2020  . Mild recurrent major depression ([place]) 06/23/2020  . Mitral valve disorder 06/23/2020  . Monocytosis 06/23/2020  . Neuropathic pain 06/23/2020  . Prediabetes 06/23/2020  . Renal cyst 06/23/2020  . Senile purpura ([place]) 06/23/2020  . Solitary pulmonary nodule 06/23/2020  . Spinal stenosis in cervical region 06/23/2020  . Bronchiectasis with (acute) exacerbation ([place]) 03/25/2020  . Centrilobular emphysema ([place]) 03/25/2020  . Abnormal findings on diagnostic imaging of lung 03/25/2020  . Abnormal CT of the chest 07/30/2019  . Elevated rheumatoid factor 07/30/2019  . Oral candidiasis 07/30/2019  . Sinus tachycardia 04/03/2019  . Chronic otorrhea of left ear 03/28/2019  . Confusion 01/02/2019  . Bilateral impacted cerumen 01/31/2018  . Abnormal EKG-dynamic TWI 10/31/2017  . PVC's (premature ventricular contractions) 10/31/2017  . Family history of coronary artery disease in father 10/31/2017  . Unstable angina ([place]) 10/28/2017  . Bilateral inguinal hernia 11/04/2016  . Neck pain 10/14/2016  . Mobitz  type 1 second degree atrioventricular block 03/16/2016  . Syncope 03/15/2016  . Acute suppurative otitis media of left  ear without spontaneous rupture of tympanic membrane 12/24/2015  . Malnutrition of moderate degree 10/21/2015  . Hyperkinetic disorder   . Choreiform movements 10/20/2015  . Bilateral tympanic membrane perforation 09/24/2015  . 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 Weight loss 09/24/2015  . Chronic respiratory failure with hypoxia ([place]) 09/14/2015  . Cough   . Acute respiratory distress 09/04/2015  . Barrett's esophagus 09/04/2015  . Dyspnea on exertion 09/04/2015  . CAP (community acquired pneumonia) 09/03/2015  . Upper airway cough syndrome 08/21/2015  . Chest pain with moderate risk of acute coronary syndrome 05/13/2015  . Bilateral inguinal hernia (BIH) 01/08/2014  . Ileus ([place]) 08/18/2012  . Pain due to Left hip joint prosthesis 06/13/2011  . Hyperparathyroidism, primary, s/p MI parathyroidectomy 7/18, 3.1 gm adenoma 02/19/2011  . LIPOMAS, MULTIPLE 10/24/2007  . HLD (hyperlipidemia) 10/24/2007  . Depression 10/24/2007  . ADD (attention deficit disorder) 10/24/2007  . SINUSITIS, CHRONIC 10/24/2007  . PNEUMONIA, RECURRENT 10/24/2007  . HIP FRACTURE, LEFT 10/24/2007  . GENITAL HERPES, HX OF 10/24/2007  . TOBACCO USE, QUIT 10/24/2007  . HIP REPLACEMENT, TOTAL, HX OF 10/24/2007  . Other acquired absence of organ 10/24/2007  . G E R D 07/13/2007    Past Medical History:  Diagnosis Date  . Arthritis   . Bronchitis    history of  . 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 GERD (gastroesophageal reflux disease)   . Hiatal hernia   . 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 History of echocardiogram    Echo 11/2019: EF 65-70, no R WMA, mild concentric LVH, normal RV SF, normal PASP (RVSP 19.2 mmHg), trivial MR  . Hypercalcemia   . Lipoma    left forearm (3)  . Malignant melanoma of skin ([place]) 06/23/2020  . Mitral valve prolapse    does not see cardiologist for. last stress test 2003  . Pneumonia 2009  . Tumor cells, benign    lung, left side  .  Unstable angina ([place]) 10/28/2017    Family History  Problem Relation Age of Onset  . Lung cancer Father 104       smoked  . Heart Problems Father   . Cancer Brother        prostate  . Heart Problems Mother   . Neuropathy Sister   . Healthy Son   . Healthy Daughter   . Healthy Daughter    Past Surgical History:  Procedure Laterality Date  . APPENDECTOMY  1984  . CATARACT EXTRACTION     L and R eye  . CHOLECYSTECTOMY  2002  . HYDROCELE EXCISION Right 11/04/2016   Procedure: HYDROCELECTOMY ADULT;  Surgeon: Franchot Gallo, MD;  Location: [facility];  Service: Urology;  Laterality: Right;  . INGUINAL HERNIA REPAIR Bilateral 11/04/2016   Procedure: HERNIA REPAIR INGUINAL ADULT BILATERAL;  Surgeon: Jackolyn Confer, MD;  Location: [facility];  Service: General;  Laterality: Bilateral;  . INSERTION OF MESH Bilateral 11/04/2016   Procedure: INSERTION OF MESH;  Surgeon: Jackolyn Confer, MD;  Location: [facility];  Service: General;  Laterality: Bilateral;  . JOINT REPLACEMENT     Lt hip  . LEFT HEART CATH AND CORONARY ANGIOGRAPHY N/A 10/31/2017   Procedure: LEFT HEART CATH AND CORONARY ANGIOGRAPHY;  Surgeon: Troy Sine, MD;  Location: [facility];  Service: Cardiovascular;  Laterality: N/A;  .  MASTOIDECTOMY  1996  . NASAL SEPTUM SURGERY     sinus surgery  . PARATHYROIDECTOMY    . 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   Right handed.   Caffeine - one cup daily.   Immunization History  Administered Date(s) Administered  . H1N1 04/25/2008  . Influenza Split 04/25/2008, 04/08/2009, 04/03/2010, 04/02/2011, 04/18/2012, 04/27/2013, 04/05/2015, 03/30/2019  . Influenza, High Dose Seasonal PF 03/30/2016  .  Influenza,inj,Quad PF,6+ Mos 04/02/2016, 03/03/2017, 04/28/2018, 04/04/2019, 03/20/2020  . Influenza,inj,quad, With Preservative 04/15/2015  . PFIZER(Purple Top)SARS-COV-2 Vaccination 08/12/2019, 09/05/2019, 04/15/2020, 10/13/2020  . Pneumococcal Conjugate-13 05/08/2015  . Pneumococcal Polysaccharide-23 10/24/2007, 03/03/2017  . Td 03/26/2004  . Tdap 03/13/2013  . Zoster, Live 03/13/2013     Objective: Vital Signs: BP 123/78 (BP Location: Left Arm, Patient Position: Sitting, Cuff Size: Normal)   Pulse 86   Resp 15   Ht 5' 11.5" (1.816 m)   Wt 173 lb 12.8 oz (78.8 kg)   BMI 23.90 kg/m    Physical Exam Vitals and nursing note reviewed.  Constitutional:      Appearance: He is well-developed.  HENT:     Head: Normocephalic and atraumatic.  Eyes:     Conjunctiva/sclera: Conjunctivae normal.     Pupils: Pupils are equal, round, and reactive to light.  Cardiovascular:     Rate and Rhythm: Normal rate and regular rhythm.     Heart sounds: Normal heart sounds.  Pulmonary:     Effort: Pulmonary effort is normal.     Breath sounds: Normal breath sounds.  Abdominal:     General: Bowel sounds are normal.     Palpations: Abdomen is soft.  Musculoskeletal:     Cervical back: Normal range of motion and neck supple.  Skin:    General: Skin is warm and dry.     Capillary Refill: Capillary refill takes less than 2 seconds.  Neurological:     Mental Status: He is alert and oriented to person, place, and time.  Psychiatric:        Behavior: Behavior normal.      Musculoskeletal Exam: C-spine was in good range of motion.  Shoulder joints, elbow joints, wrist joints, MCPs with good range of motion with no synovitis.  He has PIP and DIP thickening and decreased range of motion due to osteoarthritis.  Hip joints and knee joints with good range of motion.  He had no tenderness over ankles or MTPs.  No synovitis was noted on the examination. CDAI Exam: CDAI Score: -- Patient Global: --;  Provider Global: -- Swollen: --; Tender: -- Joint Exam 12/08/2020   No joint exam has been documented for this visit   There is currently no information documented on the homunculus. Go to the Rheumatology activity and complete the homunculus joint exam.  Investigation: No additional findings.  Imaging: DG Chest 2 View  Result Date: 11/13/2020 CLINICAL DATA:  Cough EXAM: CHEST - 2 VIEW COMPARISON:  07/18/2020 FINDINGS: Heart size is normal. Mediastinal contours are within normal limits. Continued prominence of the interstitium similar to prior study. No acute confluent opacities or effusions. No acute bony abnormality. IMPRESSION: Stable chronic interstitial prominence.  No active disease. Electronically Signed  By: Rolm Baptise M.D.   On: 11/13/2020 09:25    Recent Labs: Lab Results  Component Value Date   WBC 6.4 07/18/2020   HGB 15.0 07/18/2020   PLT 174.0 07/18/2020   NA 142 07/18/2020   K 4.2 07/18/2020   CL 104 07/18/2020   CO2 29 07/18/2020   GLUCOSE 90 07/18/2020   BUN 22 07/18/2020   CREATININE 1.03 07/18/2020   BILITOT 0.5 07/18/2020   ALKPHOS 77 07/18/2020   AST 24 07/18/2020   ALT 29 07/18/2020   PROT 6.0 07/18/2020   ALBUMIN 4.1 07/18/2020   CALCIUM 9.0 07/18/2020   GFRAA >60 08/17/2019    Speciality Comments: No specialty comments available.  Procedures:  No procedures performed Allergies: Bupropion, Ciprofloxacin-dexamethasone, Gabapentin, Ciprofloxacin hcl, and Clonidine hcl   Assessment / Plan:     Visit Diagnoses: Rheumatoid factor positive - August 17, 2019 RF 206.6.  He has positive rheumatoid factor but no clinical features of rheumatoid arthritis.  Significance of positive rheumatoid factor was discussed.  I advised him to contact me in case he develops any joint swelling.  Primary osteoarthritis of both hands - Clinical and radiographic findings are consistent with osteoarthritis.  X-ray findings were discussed with the patient.  Joint  protection muscle strengthening was discussed.  Handout on hand exercises was given.  Some of the exercises were demonstrated in the office.  History of total left hip arthroplasty - due to a fall in 2004. Revision by Dr. Mayer Camel 2008.  He had good range of motion without discomfort.  Primary osteoarthritis of both feet - Clinical and radiographic findings are consistent with osteoarthritis.  X-ray findings were discussed.  Proper fitting shoes were discussed.  Muscle strengthening exercises were discussed.  DDD (degenerative disc disease), cervical - He has chronic neck pain.  Arthropathy of lumbar facet joint - chronic pain.  He has thoracic kyphosis.  Back to strengthening exercises were discussed.  Chronic bronchitis, unspecified chronic bronchitis type ([place]) - ILD changes noted on the previous CT scan resolved on the high-resolution CT obtained in February.  CAD findings noted.  Former smoker  Barrett's esophagus without dysplasia  History of gastroesophageal reflux (GERD)  Ileus ([facility]) - X2  Mobitz type 1 second degree atrioventricular block  PVC's (premature ventricular contractions)  Hyperparathyroidism, primary, s/p MI parathyroidectomy 7/18, 3.1 gm adenoma  Orders: No orders of the defined types were placed in this encounter.  No orders of the defined types were placed in this encounter.     Follow-Up Instructions: Return in about 1 year (around 12/08/2021) for Osteoarthritis, +RF.   Bo Merino, MD  Note - This record has been created using Editor, commissioning.  Chart creation errors have been sought, but may not always  have been located. Such creation errors do not reflect on  the standard of medical care.

## 2020-12-02 ENCOUNTER — Telehealth: Payer: Self-pay | Admitting: Primary Care

## 2020-12-02 ENCOUNTER — Telehealth (HOSPITAL_COMMUNITY): Payer: Self-pay

## 2020-12-02 NOTE — Telephone Encounter (Signed)
Looked at pt's outside meds to be reconcilled and I am showing an Rx on there for Symbicort 51mcg. This was updated today from [facility]. Instructions on this is showing 1 puff.  Also showing on pt's med list is Spiriva Respimat 2.49mcg as a sample was given to pt at last OV 09/03/20.

## 2020-12-02 NOTE — Telephone Encounter (Signed)
Can we find out what low does ICS/LABA is covered on his plan so we can send in RX. If none are affordable we can apply for patient assistance Symbicort 80

## 2020-12-02 NOTE — Telephone Encounter (Signed)
Glad cough is some better. Can you please see what ICS/LABA is formulary on his insurance plan (examples symbicort, dulera, breo, advair, wixela) and we can try sending that in to see if it helps cough.

## 2020-12-02 NOTE — Telephone Encounter (Signed)
I called and spoke with patient who stated that is he using spiriva in the AM and albuterol throughout the day. He normally uses his rescue 3 times daily. He never got symbicort due to the cost. Will route to [facility] for any recs. Notified patient may hear back tomm and not today. Patient verbalized understanding, will route to beth.  Beth, please advise on recs. Thanks!

## 2020-12-02 NOTE — Telephone Encounter (Signed)
Called and spoke with pt who stated he spoke with pulmonary rehab about him beginning. Pt told them that he was still having spasms with coughing and due to this, they told him that they thought he should hold off on beginning pulm rehab until the coughing spasms got under control.  Pt said that he still has some prednisone left that he has been taking which has helped with the spasms but he states that he is still having them at least 15-20 times a day which is less than the prior amount which had been around 30 times prior.  Pt was told by pulm rehab to call them back once the coughing spasms did get under control so he could schedule a start date of the rehab.  Sending this to [facility] and MR as an Pharmacist, hospital.

## 2020-12-02 NOTE — Telephone Encounter (Signed)
I checked the patient's formulary. [name], Anoro are all covered but are in the same tier as Symbicort.

## 2020-12-02 NOTE — Telephone Encounter (Signed)
Please call him and see if he is taking Symbicort 41mcg and Spiriva. We can increase Symbicort to 2 puffs twice a day.

## 2020-12-02 NOTE — Telephone Encounter (Signed)
Pt called and stated he would like to check with [name] about his coughing spells before hand. He will contact PR back afterwards.

## 2020-12-03 MED ORDER — BREO ELLIPTA 100-25 MCG/INH IN AEPB: 1.0000 | INHALATION_SPRAY | Freq: Every day | RESPIRATORY_TRACT | 0 refills | Status: DC

## 2020-12-03 NOTE — Telephone Encounter (Signed)
Sample of Breo 100 placed up front for patient to pick up.  Nothing further needed.

## 2020-12-03 NOTE — Telephone Encounter (Signed)
I think addition of low dose ICS/LABA will help his cough. Give patient Breo 100 sample. If helps his cough we can send in prescription and he can apply for patient assistance.

## 2020-12-03 NOTE — Telephone Encounter (Signed)
Patient is aware of recommendations and voiced his understanding.  He would like to pickup sample tomorrow.   Triage, please place sample up front for pickup.

## 2020-12-04 ENCOUNTER — Ambulatory Visit: Payer: Medicare Other | Admitting: Neurology

## 2020-12-08 ENCOUNTER — Ambulatory Visit (INDEPENDENT_AMBULATORY_CARE_PROVIDER_SITE_OTHER): Payer: Medicare Other | Admitting: Rheumatology

## 2020-12-08 ENCOUNTER — Other Ambulatory Visit: Payer: Self-pay

## 2020-12-08 ENCOUNTER — Telehealth (HOSPITAL_COMMUNITY): Payer: Self-pay

## 2020-12-08 ENCOUNTER — Ambulatory Visit: Payer: Medicare Other | Admitting: Neurology

## 2020-12-08 ENCOUNTER — Encounter: Payer: Self-pay | Admitting: Rheumatology

## 2020-12-08 VITALS — BP 123/78 | HR 86 | Resp 15 | Ht 71.5 in | Wt 173.8 lb

## 2020-12-08 DIAGNOSIS — Z96642 Presence of left artificial hip joint: Secondary | ICD-10-CM

## 2020-12-08 DIAGNOSIS — M19072 Primary osteoarthritis, left ankle and foot: Secondary | ICD-10-CM

## 2020-12-08 DIAGNOSIS — M47816 Spondylosis without myelopathy or radiculopathy, lumbar region: Secondary | ICD-10-CM

## 2020-12-08 DIAGNOSIS — K567 Ileus, unspecified: Secondary | ICD-10-CM | POA: Diagnosis not present

## 2020-12-08 DIAGNOSIS — M5030 Other cervical disc degeneration, unspecified cervical region: Secondary | ICD-10-CM

## 2020-12-08 DIAGNOSIS — I493 Ventricular premature depolarization: Secondary | ICD-10-CM

## 2020-12-08 DIAGNOSIS — M19071 Primary osteoarthritis, right ankle and foot: Secondary | ICD-10-CM

## 2020-12-08 DIAGNOSIS — Z8719 Personal history of other diseases of the digestive system: Secondary | ICD-10-CM

## 2020-12-08 DIAGNOSIS — K2270 Barrett's esophagus without dysplasia: Secondary | ICD-10-CM | POA: Diagnosis not present

## 2020-12-08 DIAGNOSIS — J42 Unspecified chronic bronchitis: Secondary | ICD-10-CM | POA: Diagnosis not present

## 2020-12-08 DIAGNOSIS — R768 Other specified abnormal immunological findings in serum: Secondary | ICD-10-CM | POA: Diagnosis not present

## 2020-12-08 DIAGNOSIS — M19042 Primary osteoarthritis, left hand: Secondary | ICD-10-CM

## 2020-12-08 DIAGNOSIS — M19041 Primary osteoarthritis, right hand: Secondary | ICD-10-CM | POA: Diagnosis not present

## 2020-12-08 DIAGNOSIS — Z87891 Personal history of nicotine dependence: Secondary | ICD-10-CM | POA: Diagnosis not present

## 2020-12-08 DIAGNOSIS — I441 Atrioventricular block, second degree: Secondary | ICD-10-CM

## 2020-12-08 DIAGNOSIS — E210 Primary hyperparathyroidism: Secondary | ICD-10-CM

## 2020-12-08 NOTE — Telephone Encounter (Signed)
No response from pt.  Closed referral  

## 2020-12-08 NOTE — Patient Instructions (Signed)
Hand Exercises Hand exercises can be helpful for almost anyone. These exercises can strengthen the hands, improve flexibility and movement, and increase blood flow to the hands. These results can make work and daily tasks easier. Hand exercises can be especially helpful for people who have joint pain from arthritis or have nerve damage from overuse (carpal tunnel syndrome). These exercises can also help people who have injured a hand. Exercises Most of these hand exercises are gentle stretching and motion exercises. It is usually safe to do them often throughout the day. Warming up your hands before exercise may help to reduce stiffness. You can do this with gentle massage or by placing your hands in warm water for 10-15 minutes. It is normal to feel some stretching, pulling, tightness, or mild discomfort as you begin new exercises. This will gradually improve. Stop an exercise right away if you feel sudden, severe pain or your pain gets worse. Ask your health care provider which exercises are best for you. Knuckle bend or "claw" fist 1. Stand or sit with your arm, hand, and all five fingers pointed straight up. Make sure to keep your wrist straight during the exercise. 2. Gently bend your fingers down toward your palm until the tips of your fingers are touching the top of your palm. Keep your big knuckle straight and just bend the small knuckles in your fingers. 3. Hold this position for __________ seconds. 4. Straighten (extend) your fingers back to the starting position. Repeat this exercise 5-10 times with each hand. Full finger fist 1. Stand or sit with your arm, hand, and all five fingers pointed straight up. Make sure to keep your wrist straight during the exercise. 2. Gently bend your fingers into your palm until the tips of your fingers are touching the middle of your palm. 3. Hold this position for __________ seconds. 4. Extend your fingers back to the starting position, stretching every  joint fully. Repeat this exercise 5-10 times with each hand. Straight fist 1. Stand or sit with your arm, hand, and all five fingers pointed straight up. Make sure to keep your wrist straight during the exercise. 2. Gently bend your fingers at the big knuckle, where your fingers meet your hand, and the middle knuckle. Keep the knuckle at the tips of your fingers straight and try to touch the bottom of your palm. 3. Hold this position for __________ seconds. 4. Extend your fingers back to the starting position, stretching every joint fully. Repeat this exercise 5-10 times with each hand. Tabletop 1. Stand or sit with your arm, hand, and all five fingers pointed straight up. Make sure to keep your wrist straight during the exercise. 2. Gently bend your fingers at the big knuckle, where your fingers meet your hand, as far down as you can while keeping the small knuckles in your fingers straight. Think of forming a tabletop with your fingers. 3. Hold this position for __________ seconds. 4. Extend your fingers back to the starting position, stretching every joint fully. Repeat this exercise 5-10 times with each hand. Finger spread 1. Place your hand flat on a table with your palm facing down. Make sure your wrist stays straight as you do this exercise. 2. Spread your fingers and thumb apart from each other as far as you can until you feel a gentle stretch. Hold this position for __________ seconds. 3. Bring your fingers and thumb tight together again. Hold this position for __________ seconds. Repeat this exercise 5-10 times with each hand.   Making circles 1. Stand or sit with your arm, hand, and all five fingers pointed straight up. Make sure to keep your wrist straight during the exercise. 2. Make a circle by touching the tip of your thumb to the tip of your index finger. 3. Hold for __________ seconds. Then open your hand wide. 4. Repeat this motion with your thumb and each finger on your  hand. Repeat this exercise 5-10 times with each hand. Thumb motion 1. Sit with your forearm resting on a table and your wrist straight. Your thumb should be facing up toward the ceiling. Keep your fingers relaxed as you move your thumb. 2. Lift your thumb up as high as you can toward the ceiling. Hold for __________ seconds. 3. Bend your thumb across your palm as far as you can, reaching the tip of your thumb for the small finger (pinkie) side of your palm. Hold for __________ seconds. Repeat this exercise 5-10 times with each hand. Grip strengthening 1. Hold a stress ball or other soft ball in the middle of your hand. 2. Slowly increase the pressure, squeezing the ball as much as you can without causing pain. Think of bringing the tips of your fingers into the middle of your palm. All of your finger joints should bend when doing this exercise. 3. Hold your squeeze for __________ seconds, then relax. Repeat this exercise 5-10 times with each hand.   Contact a health care provider if:  Your hand pain or discomfort gets much worse when you do an exercise.  Your hand pain or discomfort does not improve within 2 hours after you exercise. If you have any of these problems, stop doing these exercises right away. Do not do them again unless your health care provider says that you can. Get help right away if:  You develop sudden, severe hand pain or swelling. If this happens, stop doing these exercises right away. Do not do them again unless your health care provider says that you can. This information is not intended to replace advice given to you by your health care provider. Make sure you discuss any questions you have with your health care provider. Document Revised: 10/12/2018 Document Reviewed: 06/22/2018 Elsevier Patient Education  2021 [employer]vier Inc.  

## 2020-12-18 ENCOUNTER — Ambulatory Visit: Payer: Medicare Other | Admitting: Neurology

## 2020-12-29 ENCOUNTER — Encounter: Payer: Self-pay | Admitting: Podiatry

## 2020-12-29 ENCOUNTER — Other Ambulatory Visit: Payer: Self-pay

## 2020-12-29 ENCOUNTER — Ambulatory Visit (INDEPENDENT_AMBULATORY_CARE_PROVIDER_SITE_OTHER): Payer: Medicare Other | Admitting: Podiatry

## 2020-12-29 DIAGNOSIS — M79675 Pain in left toe(s): Secondary | ICD-10-CM | POA: Diagnosis not present

## 2020-12-29 DIAGNOSIS — B351 Tinea unguium: Secondary | ICD-10-CM | POA: Diagnosis not present

## 2020-12-29 DIAGNOSIS — M79674 Pain in right toe(s): Secondary | ICD-10-CM | POA: Diagnosis not present

## 2020-12-30 NOTE — Progress Notes (Signed)
  Subjective:  Patient ID: [name], male    DOB: Dec 02, 1950,  MRN: [id number]  70 y.o. male presents painful thick toenails that are difficult to trim. Pain interferes with ambulation. Aggravating factors include wearing enclosed shoe gear. Pain is relieved with periodic professional debridement.  PCP is Jonathon Jordan, MD , and last visit was 12/16/2020.  Allergies  Allergen Reactions   Bupropion Other (See Comments)    Caused altered mental status Altered mental status Caused altered mental status   Ciprofloxacin-Dexamethasone Other (See Comments)    Burning in ears when Cipro ear drops were used unknown Burning in ears when Cipro ear drops were used   Gabapentin Other (See Comments)    Caused weakness, dizziness, and forgetfulness  Weakness and dizziness with higher doses. Weakness and dizziness Caused weakness, dizziness, and forgetfulness   Ciprofloxacin Hcl Other (See Comments)    Burning in ears when drops were used   Clonidine Hcl Other (See Comments)    Review of Systems: Negative except as noted in the HPI.   Objective:  Neurovascular Examination: Neurovascular status intact b/l with palpable pedal pulses. No edema. No pain with calf compression b/l. Skin temperature gradient WNL b/l.   Protective sensation intact b/l with 10 gram monofilament.  Dermatological Examination: Pedal skin with normal turgor, texture and tone b/l.  Toenails 1-5 b/l thick, discolored, elongated with subungual debris and pain on dorsal palpation.  No hyperkeratotic lesions noted b/l.  Musculoskeletal Examination: Muscle strength 5/5 to b/l LE. No gross bony deformities b/l.   Radiographs: None Assessment:  No diagnosis found.  Plan:  Patient was evaluated and treated and all questions answered.  Onychomycosis with pain -Nails palliatively debridement as below. -Educated on self-care  Procedure: Nail Debridement Rationale: Pain Type of Debridement: manual, sharp  debridement. Instrumentation: Nail nipper, rotary burr. Number of Nails: 10  -Examined patient. -Patient to continue soft, supportive shoe gear daily. -Toenails 1-5 b/l were debrided in length and girth with sterile nail nippers and dremel without iatrogenic bleeding.  -Patient to report any pedal injuries to medical professional immediately. -Patient/POA to call should there be question/concern in the interim.  Return in about 9 weeks (around 03/02/2021).  Marzetta Board, DPM

## 2021-01-04 DIAGNOSIS — Z20822 Contact with and (suspected) exposure to covid-19: Secondary | ICD-10-CM | POA: Diagnosis not present

## 2021-01-19 DIAGNOSIS — H9211 Otorrhea, right ear: Secondary | ICD-10-CM | POA: Diagnosis not present

## 2021-01-19 DIAGNOSIS — H7293 Unspecified perforation of tympanic membrane, bilateral: Secondary | ICD-10-CM | POA: Diagnosis not present

## 2021-01-27 DIAGNOSIS — Z8719 Personal history of other diseases of the digestive system: Secondary | ICD-10-CM | POA: Diagnosis not present

## 2021-01-27 DIAGNOSIS — Z800 Family history of malignant neoplasm of digestive organs: Secondary | ICD-10-CM | POA: Diagnosis not present

## 2021-01-27 DIAGNOSIS — R0789 Other chest pain: Secondary | ICD-10-CM | POA: Diagnosis not present

## 2021-01-29 DIAGNOSIS — Z961 Presence of intraocular lens: Secondary | ICD-10-CM | POA: Diagnosis not present

## 2021-01-29 DIAGNOSIS — H0288A Meibomian gland dysfunction right eye, upper and lower eyelids: Secondary | ICD-10-CM | POA: Diagnosis not present

## 2021-01-29 DIAGNOSIS — H5213 Myopia, bilateral: Secondary | ICD-10-CM | POA: Diagnosis not present

## 2021-01-29 DIAGNOSIS — H532 Diplopia: Secondary | ICD-10-CM | POA: Diagnosis not present

## 2021-01-29 DIAGNOSIS — H524 Presbyopia: Secondary | ICD-10-CM | POA: Diagnosis not present

## 2021-01-29 DIAGNOSIS — H52223 Regular astigmatism, bilateral: Secondary | ICD-10-CM | POA: Diagnosis not present

## 2021-01-29 DIAGNOSIS — H43813 Vitreous degeneration, bilateral: Secondary | ICD-10-CM | POA: Diagnosis not present

## 2021-01-29 DIAGNOSIS — H0288B Meibomian gland dysfunction left eye, upper and lower eyelids: Secondary | ICD-10-CM | POA: Diagnosis not present

## 2021-01-29 DIAGNOSIS — H0102A Squamous blepharitis right eye, upper and lower eyelids: Secondary | ICD-10-CM | POA: Diagnosis not present

## 2021-01-29 DIAGNOSIS — H0102B Squamous blepharitis left eye, upper and lower eyelids: Secondary | ICD-10-CM | POA: Diagnosis not present

## 2021-02-04 DIAGNOSIS — Z20822 Contact with and (suspected) exposure to covid-19: Secondary | ICD-10-CM | POA: Diagnosis not present

## 2021-02-09 ENCOUNTER — Other Ambulatory Visit: Payer: Self-pay | Admitting: *Deleted

## 2021-02-09 ENCOUNTER — Other Ambulatory Visit: Payer: Self-pay

## 2021-02-09 MED ORDER — METOPROLOL SUCCINATE ER 25 MG PO TB24: ORAL_TABLET | ORAL | 0 refills | Status: DC

## 2021-02-12 DIAGNOSIS — K317 Polyp of stomach and duodenum: Secondary | ICD-10-CM | POA: Diagnosis not present

## 2021-02-12 DIAGNOSIS — Z800 Family history of malignant neoplasm of digestive organs: Secondary | ICD-10-CM | POA: Diagnosis not present

## 2021-02-12 DIAGNOSIS — K449 Diaphragmatic hernia without obstruction or gangrene: Secondary | ICD-10-CM | POA: Diagnosis not present

## 2021-02-12 DIAGNOSIS — R0789 Other chest pain: Secondary | ICD-10-CM | POA: Diagnosis not present

## 2021-02-12 DIAGNOSIS — K219 Gastro-esophageal reflux disease without esophagitis: Secondary | ICD-10-CM | POA: Diagnosis not present

## 2021-02-12 DIAGNOSIS — D124 Benign neoplasm of descending colon: Secondary | ICD-10-CM | POA: Diagnosis not present

## 2021-02-17 DIAGNOSIS — K219 Gastro-esophageal reflux disease without esophagitis: Secondary | ICD-10-CM | POA: Diagnosis not present

## 2021-02-17 DIAGNOSIS — D124 Benign neoplasm of descending colon: Secondary | ICD-10-CM | POA: Diagnosis not present

## 2021-02-23 ENCOUNTER — Encounter: Payer: Self-pay | Admitting: Podiatry

## 2021-02-23 ENCOUNTER — Other Ambulatory Visit: Payer: Self-pay

## 2021-02-23 ENCOUNTER — Ambulatory Visit (INDEPENDENT_AMBULATORY_CARE_PROVIDER_SITE_OTHER): Payer: Medicare Other | Admitting: Podiatry

## 2021-02-23 DIAGNOSIS — M79674 Pain in right toe(s): Secondary | ICD-10-CM

## 2021-02-23 DIAGNOSIS — B351 Tinea unguium: Secondary | ICD-10-CM | POA: Diagnosis not present

## 2021-02-23 DIAGNOSIS — M79675 Pain in left toe(s): Secondary | ICD-10-CM

## 2021-02-28 NOTE — Progress Notes (Signed)
Subjective: [name] is a 70 y.o. male patient seen today for follow up of  painful thick toenails that are difficult to trim. Pain interferes with ambulation. Aggravating factors include wearing enclosed shoe gear. Pain is relieved with periodic professional debridement.  New problems reported today: None.  Allergies  Allergen Reactions   Bupropion Other (See Comments)    Caused altered mental status Altered mental status Caused altered mental status   Ciprofloxacin-Dexamethasone Other (See Comments)    Burning in ears when Cipro ear drops were used unknown Burning in ears when Cipro ear drops were used   Gabapentin Other (See Comments)    Caused weakness, dizziness, and forgetfulness  Weakness and dizziness with higher doses. Weakness and dizziness Caused weakness, dizziness, and forgetfulness   Ciprofloxacin Hcl Other (See Comments)    Burning in ears when drops were used   Clonidine Hcl Other (See Comments)    PCP is Jonathon Jordan, MD .  Objective: Physical Exam  General: Patient is a pleasant 70 y.o. Caucasian male WD, WN in NAD. AAO x 3.   Neurovascular Examination: Capillary refill time to digits immediate b/l. Palpable pedal pulses b/l LE. Pedal hair present. Lower extremity skin temperature gradient within normal limits. No edema noted b/l lower extremities.   Protective sensation intact 5/5 intact bilaterally with 10g monofilament b/l. Vibratory sensation intact b/l.  Dermatological:  Skin warm and supple b/l lower extremities. No open wounds b/l lower extremities. No interdigital macerations b/l lower extremities. Toenails 1-5 b/l elongated, discolored, dystrophic, thickened, crumbly with subungual debris and tenderness to dorsal palpation.  Musculoskeletal:  Normal muscle strength 5/5 to all lower extremity muscle groups bilaterally. No pain crepitus or joint limitation noted with ROM b/l lower extremities. No gross bony deformities.  Assessment: 1.  Pain due to onychomycosis of toenails of both feet     Plan:  -Examined patient. -No new findings. No new orders. -Toenails 1-5 b/l were debrided in length and girth with sterile nail nippers and dremel without iatrogenic bleeding.  -Patient to report any pedal injuries to medical professional immediately. -Patient/POA to call should there be question/concern in the interim.  Return in about 9 weeks (around 04/27/2021).  Marzetta Board, DPM

## 2021-03-17 DIAGNOSIS — Z23 Encounter for immunization: Secondary | ICD-10-CM | POA: Diagnosis not present

## 2021-03-20 DIAGNOSIS — H9212 Otorrhea, left ear: Secondary | ICD-10-CM | POA: Diagnosis not present

## 2021-03-26 DIAGNOSIS — Z23 Encounter for immunization: Secondary | ICD-10-CM | POA: Diagnosis not present

## 2021-03-31 ENCOUNTER — Telehealth: Payer: Self-pay | Admitting: Internal Medicine

## 2021-03-31 MED ORDER — DOXYCYCLINE HYCLATE 100 MG PO TABS: 100.0000 mg | ORAL_TABLET | Freq: Two times a day (BID) | ORAL | 0 refills | Status: DC

## 2021-03-31 NOTE — Telephone Encounter (Signed)
Spoke with pt and notified of response per MR  He verbalized understanding  I have sent rx for doxy bc he stated he felt pred not needed now  He will call back if not improving

## 2021-03-31 NOTE — Telephone Encounter (Signed)
  Take doxycycline 100mg  po twice daily x 5 days; take after meals and avoid sunlight   And if he wants   Please take prednisone 40 mg x1 day, then 30 mg x1 day, then 20 mg x1 day, then 10 mg x1 day, and then 5 mg x1 day and stop    Allergies  Allergen Reactions   Bupropion Other (See Comments)    Caused altered mental status Altered mental status Caused altered mental status   Ciprofloxacin-Dexamethasone Other (See Comments)    Burning in ears when Cipro ear drops were used unknown Burning in ears when Cipro ear drops were used   Gabapentin Other (See Comments)    Caused weakness, dizziness, and forgetfulness  Weakness and dizziness with higher doses. Weakness and dizziness Caused weakness, dizziness, and forgetfulness   Ciprofloxacin Hcl Other (See Comments)    Burning in ears when drops were used   Clonidine Hcl Other (See Comments)

## 2021-03-31 NOTE — Telephone Encounter (Signed)
Called and spoke with patient. He stated that he and his wife both received their flu vaccine last week on 03/26/21. They both immediately felt sick with increased fatigue, fevers w/ chills and productive cough. His wife has since recovered but he has not. He no longer has the fever or chills but still has the cough and fatigue. He is now coughing up thick yellow phlegm.   I suggested that he get tested for COVID but he stated that he is certain that he does not have COVID.   He wants to know if MR would be willing to send in an abx for him.   Pharmacy is [facility] on [address].   MR, can you please advise? Thanks!

## 2021-04-06 DIAGNOSIS — L439 Lichen planus, unspecified: Secondary | ICD-10-CM | POA: Diagnosis not present

## 2021-04-06 DIAGNOSIS — C44612 Basal cell carcinoma of skin of right upper limb, including shoulder: Secondary | ICD-10-CM | POA: Diagnosis not present

## 2021-04-06 DIAGNOSIS — L814 Other melanin hyperpigmentation: Secondary | ICD-10-CM | POA: Diagnosis not present

## 2021-04-06 DIAGNOSIS — L570 Actinic keratosis: Secondary | ICD-10-CM | POA: Diagnosis not present

## 2021-04-06 DIAGNOSIS — D1801 Hemangioma of skin and subcutaneous tissue: Secondary | ICD-10-CM | POA: Diagnosis not present

## 2021-04-06 DIAGNOSIS — L821 Other seborrheic keratosis: Secondary | ICD-10-CM | POA: Diagnosis not present

## 2021-04-07 ENCOUNTER — Telehealth: Payer: Self-pay

## 2021-04-07 NOTE — Patient Outreach (Signed)
[name] was transferred to [employer] through [facility].  She had to get off the phone quickly but wanted a call back.  I was not able to finish the engagement tool. I will call her back.

## 2021-04-14 ENCOUNTER — Ambulatory Visit (INDEPENDENT_AMBULATORY_CARE_PROVIDER_SITE_OTHER): Payer: Medicare Other

## 2021-04-14 ENCOUNTER — Ambulatory Visit (INDEPENDENT_AMBULATORY_CARE_PROVIDER_SITE_OTHER): Payer: Medicare Other | Admitting: Podiatry

## 2021-04-14 ENCOUNTER — Encounter: Payer: Self-pay | Admitting: Podiatry

## 2021-04-14 ENCOUNTER — Other Ambulatory Visit: Payer: Self-pay

## 2021-04-14 DIAGNOSIS — M779 Enthesopathy, unspecified: Secondary | ICD-10-CM

## 2021-04-14 DIAGNOSIS — M84375A Stress fracture, left foot, initial encounter for fracture: Secondary | ICD-10-CM

## 2021-04-14 NOTE — Progress Notes (Signed)
  Subjective:  Patient ID: [name], male    DOB: 1951/01/04,   MRN: [id number]  Chief Complaint  Patient presents with   Foot Pain    Left foot pain located at the top of pt left foot. Pt states he walked a charity marathon on Saturday the following morning is when he started to have sharp pains in his foot.     70 y.o. male presents for left foot pain that started after his walked a marathon this past Saturday. States it only hurts to walk on it. Has not tried any treatments. States it has maybe improved a little since Sunday . Denies any other pedal complaints. Denies n/v/f/c.   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 last stress test 2003   Pneumonia 2009   Tumor cells, benign    lung, left side   Unstable angina ([place]) 10/28/2017    Objective:  Physical Exam: Vascular: DP/PT pulses 2/4 bilateral. CFT <3 seconds. Normal hair growth on digits. No edema.  Skin. No lacerations or abrasions bilateral feet.  Musculoskeletal: MMT 5/5 bilateral lower extremities in DF, PF, Inversion and Eversion. Deceased ROM in DF of ankle joint. Tender over dorsal third metatarsal.  Neurological: Sensation intact to light touch.   Assessment:   1. Stress reaction of left foot, initial encounter      Plan:  Patient was evaluated and treated and all questions answered. -Xrays reviewed. No acute fracture or dislocation. Small periosteal reaction around third metatarsal with correlates with pain. Stress reaction possible.   -Discussed treatement options for stress fracture; risks, alternatives, and benefits explained. -Dispensed CAM boot. Patient to wear at all times and instructed on use -Recommend protection, rest, ice, elevation daily until symptoms improve -Rx pain med/antinflammatories as needed -Patient to return to office in 4 weeks for serial x-rays to assess healing  or sooner if condition worsens.   Lorenda Peck, DPM

## 2021-04-15 DIAGNOSIS — D0461 Carcinoma in situ of skin of right upper limb, including shoulder: Secondary | ICD-10-CM | POA: Diagnosis not present

## 2021-05-04 ENCOUNTER — Ambulatory Visit: Payer: Medicare Other | Admitting: Podiatry

## 2021-05-12 ENCOUNTER — Other Ambulatory Visit: Payer: Self-pay

## 2021-05-12 ENCOUNTER — Ambulatory Visit (INDEPENDENT_AMBULATORY_CARE_PROVIDER_SITE_OTHER): Payer: Medicare Other | Admitting: Podiatry

## 2021-05-12 ENCOUNTER — Encounter: Payer: Self-pay | Admitting: Podiatry

## 2021-05-12 DIAGNOSIS — M84375A Stress fracture, left foot, initial encounter for fracture: Secondary | ICD-10-CM | POA: Diagnosis not present

## 2021-05-12 NOTE — Progress Notes (Signed)
  Subjective:  Patient ID: [name], male    DOB: August 23, 1950,   MRN: [id number]  No chief complaint on file.   70 y.o. male presents for follow-up of left foot pain and possible stress reaction. Relates it is doing better. Has been wearing the boot which helps  . Denies any other pedal complaints. Denies n/v/f/c.   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 last stress test 2003   Pneumonia 2009   Tumor cells, benign    lung, left side   Unstable angina ([place]) 10/28/2017    Objective:  Physical Exam: Vascular: DP/PT pulses 2/4 bilateral. CFT <3 seconds. Normal hair growth on digits. No edema.  Skin. No lacerations or abrasions bilateral feet.  Musculoskeletal: MMT 5/5 bilateral lower extremities in DF, PF, Inversion and Eversion. Deceased ROM in DF of ankle joint. Mildly tender over dorsal third metatarsal.  Neurological: Sensation intact to light touch.   Assessment:   1. Stress reaction of left foot, initial encounter      Plan:  Patient was evaluated and treated and all questions answered. -Xrays reviewed from prior  -Discussed treatement options for stress fracture; risks, alternatives, and benefits explained. -May begin to transition out of the CAM boot for the next week. May continue his walking regime slowly starting next week.  -Rx pain med/antinflammatories as needed -Patient to return to office as needed.    Lorenda Peck, DPM

## 2021-05-25 DIAGNOSIS — F988 Other specified behavioral and emotional disorders with onset usually occurring in childhood and adolescence: Secondary | ICD-10-CM | POA: Diagnosis not present

## 2021-05-25 DIAGNOSIS — Z23 Encounter for immunization: Secondary | ICD-10-CM | POA: Diagnosis not present

## 2021-05-27 DIAGNOSIS — Z48817 Encounter for surgical aftercare following surgery on the skin and subcutaneous tissue: Secondary | ICD-10-CM | POA: Diagnosis not present

## 2021-06-01 DIAGNOSIS — Z03818 Encounter for observation for suspected exposure to other biological agents ruled out: Secondary | ICD-10-CM | POA: Diagnosis not present

## 2021-06-01 DIAGNOSIS — R059 Cough, unspecified: Secondary | ICD-10-CM | POA: Diagnosis not present

## 2021-06-01 DIAGNOSIS — J069 Acute upper respiratory infection, unspecified: Secondary | ICD-10-CM | POA: Diagnosis not present

## 2021-06-01 DIAGNOSIS — R5383 Other fatigue: Secondary | ICD-10-CM | POA: Diagnosis not present

## 2021-06-01 DIAGNOSIS — R0981 Nasal congestion: Secondary | ICD-10-CM | POA: Diagnosis not present

## 2021-06-01 DIAGNOSIS — J029 Acute pharyngitis, unspecified: Secondary | ICD-10-CM | POA: Diagnosis not present

## 2021-06-03 ENCOUNTER — Other Ambulatory Visit: Payer: Self-pay

## 2021-06-03 ENCOUNTER — Encounter: Payer: Self-pay | Admitting: Nurse Practitioner

## 2021-06-03 ENCOUNTER — Telehealth: Payer: Self-pay | Admitting: Internal Medicine

## 2021-06-03 ENCOUNTER — Ambulatory Visit (INDEPENDENT_AMBULATORY_CARE_PROVIDER_SITE_OTHER): Payer: Medicare Other | Admitting: Nurse Practitioner

## 2021-06-03 ENCOUNTER — Ambulatory Visit (INDEPENDENT_AMBULATORY_CARE_PROVIDER_SITE_OTHER): Payer: Medicare Other

## 2021-06-03 VITALS — BP 112/64 | HR 61 | Temp 98.1°F | Ht 71.5 in | Wt 178.0 lb

## 2021-06-03 DIAGNOSIS — J019 Acute sinusitis, unspecified: Secondary | ICD-10-CM | POA: Insufficient documentation

## 2021-06-03 DIAGNOSIS — R059 Cough, unspecified: Secondary | ICD-10-CM | POA: Diagnosis not present

## 2021-06-03 DIAGNOSIS — J441 Chronic obstructive pulmonary disease with (acute) exacerbation: Secondary | ICD-10-CM

## 2021-06-03 DIAGNOSIS — J439 Emphysema, unspecified: Secondary | ICD-10-CM | POA: Diagnosis not present

## 2021-06-03 MED ORDER — AMOXICILLIN-POT CLAVULANATE 875-125 MG PO TABS: 1.0000 | ORAL_TABLET | Freq: Two times a day (BID) | ORAL | 0 refills | Status: DC

## 2021-06-03 MED ORDER — PREDNISONE 10 MG PO TABS: ORAL_TABLET | ORAL | 0 refills | Status: DC

## 2021-06-03 MED ORDER — SPIRIVA RESPIMAT 2.5 MCG/ACT IN AERS: 2.0000 | INHALATION_SPRAY | Freq: Every day | RESPIRATORY_TRACT | 2 refills | Status: DC

## 2021-06-03 MED ORDER — FLUTICASONE PROPIONATE 50 MCG/ACT NA SUSP: 1.0000 | Freq: Every day | NASAL | 2 refills | Status: DC

## 2021-06-03 NOTE — Progress Notes (Addendum)
@Patient  ID: [name], male    DOB: [birth year], 70 y.o.   MRN: [id number]  Chief Complaint  Patient presents with   Follow-up    Patient reports some sore throat from drainage, has yellow sputum from cough and is thick and nasal congestion.     Referring provider: Jonathon Jordan, MD  HPI: 70 year old male, former smoker (36 pack years) followed for bronchiectasis, chronic respiratory failure with hypoxia, COPD/emphysema, and hx of ILD.  He is a patient of [name] and was last seen via telehealth visit by Volanda Napoleon, NP on 11/26/2020.  Past medical history significant for CAD, mitral valve disorder, cerebrovascular disease, Mobitz 1 second-degree AV block, unstable angina, allergic rhinitis, GERD with Barrett's esophagus, hyperparathyroidism status post parathyroidectomy, dementia, HLD.  TEST/EVENTS:  09/25/2019 PFTs: FVC pre 3.15 (65), FEV1 pre 2.63 (73), ratio 83%, DLCO corrected 21.32 (76).  Restriction with low diffusion capacity 11/09/2019 echocardiogram: EF 65 to 70%, mild LVH, hyperdynamic function the left ventricle.  LV diastolic parameters normal. RV size and function normal.  Normal pulmonary artery systolic pressure.  Trivial mitral valve regurgitation. 08/04/2020 spirometry: FVC 2.95 (61), FEV1 2.48 (70), ratio 84 11/13/2020 CXR 2 view: Continued prominence of the interstitium similar to prior study.  No acute confluent opacities or effusions.  Stable chronic interstitial prominence.  No active disease. 08/15/2020 HRCT: Scattered aortic arthrosclerosis.  Normal heart size.  Three-vessel coronary artery calcifications.  No lymphadenopathy.  Scattered tiny centrilobular nodules at the lung apices, likely smoking-related respiratory bronchiolitis.  Occasional stable, small, definitively benign pulmonary nodules.  Minimal paraseptal emphysema.  Unchanged, mild, tubular bronchiectasis.  Previously noted irregular peripheral interstitial opacity is not appreciated, possibly resolved  partial atelectasis owing to improved lung volumes on current exam.  No significant air trapping.  09/03/2020: OV with [name].  Currently no evidence of pulmonary fibrosis.  Suspected possible viral infection in January 2020 causing inflammation/fibrosis that then resolved.  Mild amount of SOB with exertion.  Started Spiriva, referral for pulmonary rehab.  Repeat PFTs in 3 to 4 months.  Discussed if symptoms and abnormal breathing continues, may consider pulmonary stress testing.  Follow-up 3 to 4 months  11/14/2020 OV with Volanda Napoleon NP.  Cough x2 weeks with report of cyclic cough every 10 days.  Previously treated with azithromycin.  CXR no acute cardiopulmonary findings.  Suspected bronchiectasis with exacerbation.  Mucinex twice daily.  Flutter valve.  Doxycycline for 10 days.  Flonase for nasal symptoms.  Continue Spiriva once a day and albuterol as needed.  Rechecked RF lab value (elevated to 372) due to history of elevated RA factor and arthritic pain in hands. Referred to rheumatology.  11/26/2020: Telehealth visit with Volanda Napoleon NP.  Persistent dry cough, fatigue, weakness.  Negative COVID test.  Pred 30-day taper.  Could consider adding ICS/LABA.  06/03/2021: Today - acute sick visit Patient presents for reported increased congestion and cough. Symptoms started on Sunday night after he was around his grandchildren whom are sick. He reports increased purulent sputum production and rhinorrhea with purulent exudate. He denies any fevers/chills, shortness of breath, wheezing, orthopnea, PND, or leg swelling. He tested negative for the Flu, RSV and COVID Monday. He has been taking Mucinex D and delsym over the counter with moderate relief of symptoms. He has not felt the need to use his rescue inhaler. He also reports that he stopped his Spiriva 6 months ago and states he didn't think he needed to continue it since his breathing had improved. Overall, he does  not feel well.   Allergies  Allergen Reactions    Bupropion Other (See Comments)    Caused altered mental status Altered mental status Caused altered mental status   Ciprofloxacin-Dexamethasone Other (See Comments)    Burning in ears when Cipro ear drops were used unknown Burning in ears when Cipro ear drops were used   Gabapentin Other (See Comments)    Caused weakness, dizziness, and forgetfulness  Weakness and dizziness with higher doses. Weakness and dizziness Caused weakness, dizziness, and forgetfulness   Ciprofloxacin Hcl Other (See Comments)    Burning in ears when drops were used   Clonidine Hcl Other (See Comments)    Immunization History  Administered Date(s) Administered   H1N1 04/25/2008   Influenza Split 04/25/2008, 04/08/2009, 04/03/2010, 04/02/2011, 04/18/2012, 04/27/2013, 04/05/2015, 03/30/2019   Influenza, High Dose Seasonal PF 03/30/2016   Influenza,inj,Quad PF,6+ Mos 04/02/2016, 03/03/2017, 04/28/2018, 04/04/2019, 03/20/2020   Influenza,inj,quad, With Preservative 04/15/2015   Influenza-Unspecified 03/26/2021   PFIZER(Purple Top)SARS-COV-2 Vaccination 08/12/2019, 09/05/2019, 04/15/2020, 10/13/2020   Pfizer Covid-19 Vaccine Bivalent Booster 88yrs & up 03/17/2021   Pneumococcal Conjugate-13 05/08/2015   Pneumococcal Polysaccharide-23 10/24/2007, 03/03/2017   Td 03/26/2004   Tdap 03/13/2013   Zoster, Live 03/13/2013, 05/25/2021    Past Medical History:  Diagnosis Date   Arthritis    Bronchitis    history of   Dysrhythmia    ":Due to MVP"   GERD (gastroesophageal reflux disease)    Hiatal hernia    Hip joint replacement by other means 2004   History of echocardiogram    Echo 11/2019: EF 65-70, no R WMA, mild concentric LVH, normal RV SF, normal PASP (RVSP 19.2 mmHg), trivial MR   Hypercalcemia    Lipoma    left forearm (3)   Malignant melanoma of skin ([place]) 06/23/2020   Mitral valve prolapse    does not see cardiologist for. last stress test 2003   Pneumonia 2009   Tumor cells, benign     lung, left side   Unstable angina ([place]) 10/28/2017    Tobacco History: Social History   Tobacco Use  Smoking Status Former   Packs/day: 1.50   Years: 24.00   Pack years: 36.00   Types: Cigarettes   Start date: 1966   Quit date: 07/05/1985   Years since quitting: 35.9  Smokeless Tobacco Never   Counseling given: Not Answered   Outpatient Medications Prior to Visit  Medication Sig Dispense Refill   albuterol (VENTOLIN HFA) 108 (90 Base) MCG/ACT inhaler Inhale 1-2 puffs into the lungs every 6 (six) hours as needed for wheezing or shortness of breath. 18 g 2   aspirin 81 MG chewable tablet Chew 81 mg by mouth daily.     budesonide-formoterol (SYMBICORT) 80-4.5 MCG/ACT inhaler 1 puff     celecoxib (CELEBREX) 200 MG capsule 1 capsule with food as needed     famotidine (PEPCID) 20 MG tablet Take 1 tablet (20 mg total) by mouth at bedtime. 30 tablet 3   methylphenidate (RITALIN) 10 MG tablet Take 10 mg by mouth 3 (three) times daily.     methylphenidate (RITALIN) 10 MG tablet 1 tablet on empty stomach     mirtazapine (REMERON) 15 MG tablet Take 15 mg by mouth at bedtime.     Multiple Vitamin (MULTIVITAMIN) tablet Take 1 tablet by mouth daily.     nitroGLYCERIN (NITROSTAT) 0.4 MG SL tablet Place 0.4 mg under the tongue every 5 (five) minutes as needed for chest pain.     NONFORMULARY  OR COMPOUNDED ITEM Antifungal solution: Terbinafine 3%, Fluconazole 2%, Tea Tree Oil 5%, Urea 10%, Ibuprofen 2% in DMSO suspension #25mL 1 each 3   omeprazole (PRILOSEC) 40 MG capsule Take 40 mg by mouth every morning.     polyethylene glycol (MIRALAX / GLYCOLAX) packet Take 17 g by mouth daily as needed (for constipation.).      rosuvastatin (CRESTOR) 10 MG tablet TAKE 1 TABLET(10 MG) BY MOUTH DAILY 90 tablet 3   sulfacetamide (BLEPH-10) 10 % ophthalmic solution 4 drops to left ear twice daily     SYNTHROID 25 MCG tablet Take 25 mcg by mouth daily before breakfast.      tamsulosin (FLOMAX) 0.4 MG CAPS capsule  Take 0.4 mg by mouth daily after breakfast.      Albuterol Sulfate (PROAIR RESPICLICK) 974 (90 Base) MCG/ACT AEPB 2 puffs four times a  day for 1 week, then 2 puffs once a day (Patient not taking: Reported on 06/03/2021)     doxycycline (VIBRA-TABS) 100 MG tablet Take 1 tablet (100 mg total) by mouth 2 (two) times daily. (Patient not taking: Reported on 06/03/2021) 10 tablet 0   fluticasone furoate-vilanterol (BREO ELLIPTA) 100-25 MCG/INH AEPB Inhale 1 puff into the lungs daily. (Patient not taking: Reported on 06/03/2021) 1 each 0   metoprolol succinate (TOPROL-XL) 25 MG 24 hr tablet TAKE 1 TABLET(25 MG) BY MOUTH DAILY. 2nd ATTEMPT. PT IS WAY OVERDUE FOR AN APPT. PLEASE HAVE PT CALL TO SCHEDULE. CAN OK 15 DAYS SUPPLY (Patient not taking: Reported on 06/03/2021) 15 tablet 0   predniSONE (DELTASONE) 10 MG tablet Take 4 tabs po daily x 3 days; then 3 tabs daily x3 days; then 2 tabs daily x3 days; then 1 tab daily x 3 days; then stop (Patient not taking: Reported on 06/03/2021) 30 tablet 0   Tiotropium Bromide Monohydrate (SPIRIVA RESPIMAT) 2.5 MCG/ACT AERS Inhale 2 puffs into the lungs daily. (Patient not taking: Reported on 06/03/2021) 4 g 0   No facility-administered medications prior to visit.     Review of Systems:   Constitutional: No weight loss or gain, night sweats, fevers, chills, fatigue, or lassitude. HEENT: +headaches, nasal congestion, rhinorrhea with purulent drainage, sore throat. No difficulty swallowing, tooth/dental problems. No sneezing, itching, ear ache CV:  No chest pain, orthopnea, PND, swelling in lower extremities, anasarca, dizziness, palpitations, syncope Resp: +Increased cough with purulent sputum production. No shortness of breath with exertion or at rest. No hemoptysis. No wheezing.  No chest wall deformity GI:  No heartburn, indigestion, abdominal pain, nausea, vomiting, diarrhea, change in bowel habits, loss of appetite, bloody stools.  GU: No dysuria, change in color  of urine, urgency or frequency.  No flank pain, no hematuria  Skin: No rash, lesions, ulcerations MSK:  No joint pain or swelling.  No decreased range of motion.  No back pain. Neuro: No dizziness or lightheadedness.  Psych: No depression or anxiety. Mood stable.     Physical Exam:  BP 112/64 (BP Location: Left Arm, Patient Position: Sitting, Cuff Size: Normal)   Pulse 61   Temp 98.1 F (36.7 C) (Oral)   Ht 5' 11.5" (1.816 m)   Wt 178 lb (80.7 kg)   SpO2 98%   BMI 24.48 kg/m   GEN: Pleasant, interactive, well-nourished; in no acute distress. HEENT:  Normocephalic and atraumatic. EACs patent bilaterally. TM pearly gray with present light reflex bilaterally. PERRLA. Sclera white. Nasal turbinates pink, moist and patent bilaterally. Clear rhinorrhea. Oropharynx erythematous and moist, without exudate or  edema. No lesions, ulcerations, or postnasal drip.  NECK:  Supple w/ fair ROM. No JVD present. Normal carotid impulses w/o bruits. Thyroid symmetrical with no goiter or nodules palpated. Cervical lymphadenopathy.   CV: RRR, no m/r/g, no peripheral edema. Pulses intact, +2 bilaterally. No cyanosis, pallor or clubbing. PULMONARY:  Unlabored, regular breathing. Rhonchorous breath sounds bilaterally w/o wheezes. No accessory muscle use. No dullness to percussion. GI: BS present and normoactive. Soft, non-tender to palpation. No organomegaly or masses detected. No CVA tenderness. MSK: No erythema, warmth or tenderness. Cap refil <2 sec all extrem. No deformities or joint swelling noted.  Neuro: A/Ox3. No focal deficits noted.   Skin: Warm, no lesions or rashe Psych: Normal affect and behavior. Judgement and thought content appropriate.     Lab Results:  CBC    Component Value Date/Time   WBC 6.4 07/18/2020 1146   RBC 4.68 07/18/2020 1146   HGB 15.0 07/18/2020 1146   HGB 15.4 08/17/2019 1517   HCT 43.3 07/18/2020 1146   PLT 174.0 07/18/2020 1146   PLT 112 (L) 08/17/2019 1517   MCV  92.5 07/18/2020 1146   MCH 30.5 08/17/2019 1517   MCHC 34.6 07/18/2020 1146   RDW 13.2 07/18/2020 1146   LYMPHSABS 1.8 07/18/2020 1146   MONOABS 0.6 07/18/2020 1146   EOSABS 0.2 07/18/2020 1146   BASOSABS 0.0 07/18/2020 1146    BMET    Component Value Date/Time   NA 142 07/18/2020 1146   NA 142 02/13/2018 0851   K 4.2 07/18/2020 1146   CL 104 07/18/2020 1146   CO2 29 07/18/2020 1146   GLUCOSE 90 07/18/2020 1146   BUN 22 07/18/2020 1146   BUN 16 02/13/2018 0851   CREATININE 1.03 07/18/2020 1146   CREATININE 1.01 08/17/2019 1517   CALCIUM 9.0 07/18/2020 1146   CALCIUM 9.0 02/19/2011 1233   GFRNONAA >60 08/17/2019 1517   GFRAA >60 08/17/2019 1517    BNP    Component Value Date/Time   BNP 42.2 06/19/2019 0902     Imaging:  06/03/2021 CXR 2 view: Reviewed by me. Interstitial prominence noted, unchanged from prior exam. No superimposed pleural effusion, Insterstitial edema or airspace consolidation. No acute cardiopulmonary abnormalities.   DG Chest 2 View  Result Date: 06/03/2021 CLINICAL DATA:  Cough with increased purulence sputum production. EXAM: CHEST - 2 VIEW COMPARISON:  11/13/2020 FINDINGS: Normal cardiomediastinal contours. Diffuse interstitial prominence is again noted in appears similar to the previous exam. No superimposed pleural effusion, interstitial edema or airspace consolidation. Degenerative disc disease noted within the thoracic spine. IMPRESSION: 1. No acute cardiopulmonary abnormalities. Electronically Signed   By: Kerby Moors M.D.   On: 06/03/2021 14:40      PFT Results Latest Ref Rng & Units 08/04/2020 03/11/2020 09/25/2019  FVC-Pre L 2.95 3.17 3.15  FVC-Predicted Pre % 61 65 65  Pre FEV1/FVC % % 84 83 83  FEV1-Pre L 2.48 2.64 2.63  FEV1-Predicted Pre % 70 74 73  DLCO uncorrected ml/min/mmHg - 24.75 21.79  DLCO UNC% % - 89 78  DLCO corrected ml/min/mmHg - 24.75 21.32  DLCO COR %Predicted % - 89 76  DLVA Predicted % - 121 112    Lab Results   Component Value Date   NITRICOXIDE 14 05/26/2016        Assessment & Plan:   COPD with acute exacerbation (HCC) No maintenance inhaler for approx 6 months. Recent sick exposure. Combination likely led to current COPD exacerbation. Abx and prednisone taper pack rx. Supportive  care measures. Re-educated on need for maintenance medications and their importance. Pt verbalized understanding and agreed to resume daily therapy with Spiriva. Close follow up to assess compliance. CXR today showed no acute cardiopulmonary disease.  Patient Instructions  -Continue albuterol inhaler 2 puffs every six hours as needed for shortness of breath or wheezing -Restart Spiriva Respimat 2 puffs daily. Continue your maintenance inhaler daily to help decrease your risk of future COPD exacerbations.  -Continue Prilosec 40 mg daily  -Augmentin Twice daily for 7 days. Notify immediately of any rash, itching, hives, or swelling, or seek emergency care. Finish your antibiotics in their entirety. Do not stop just because symptoms improve. Take with food to reduce GI upset.  -Probiotic over the counter daily while taking Augmentin  -Prednisone taper pack. 4 tabs for 2 days, then 3 tabs for 2 days, 2 tabs for 2 days, then 1 tab for 2 days, then stop. Take with food.   -Flonase nasal spray 2 sprays each nostril daily until symptoms improve then 1 spray each nostril as needed for congestion or post nasal drip -Saline nasal spray 2-3 times a day  -Continue Mucinex D over the counter twice a day  -Continue Delsym over the counter as directed for cough -Chlortab 4 mg over the counter At bedtime for cough   Notify if worsening breathlessness, cough, mucus production, fatigue, or wheezing occurs.  Maintain up to date vaccinations, including influenza, COVID, and pneumococcal.  Wash your hands often and avoid sick exposures.  Encouraged masking in crowds.  Avoid triggers, when possible.  Exercise, as tolerated. Notify if  worsening symptoms upon exertion occur.    Chest x ray today. We will notify you of results.   Follow up in one month with [name], Roxan Diesel, NP, or APP. If symptoms do not improve or worsen, please contact office for sooner follow up or seek emergency care.    Acute rhinosinusitis Supportive care. See above plan.     Clayton Bibles, NP 06/03/2021  Pt aware and understands NP's role.

## 2021-06-03 NOTE — Patient Instructions (Addendum)
-  Continue albuterol inhaler 2 puffs every six hours as needed for shortness of breath or wheezing -Restart Spiriva Respimat 2 puffs daily. Continue your maintenance inhaler daily to help decrease your risk of future COPD exacerbations.  -Continue Prilosec 40 mg daily  -Augmentin Twice daily for 7 days. Notify immediately of any rash, itching, hives, or swelling, or seek emergency care. Finish your antibiotics in their entirety. Do not stop just because symptoms improve. Take with food to reduce GI upset.  -Probiotic over the counter daily while taking Augmentin  -Prednisone taper pack. 4 tabs for 2 days, then 3 tabs for 2 days, 2 tabs for 2 days, then 1 tab for 2 days, then stop. Take with food.   -Flonase nasal spray 2 sprays each nostril daily until symptoms improve then 1 spray each nostril as needed for congestion or post nasal drip -Saline nasal spray 2-3 times a day  -Continue Mucinex D over the counter twice a day  -Continue Delsym over the counter as directed for cough -Chlortab 4 mg over the counter At bedtime for cough   Notify if worsening breathlessness, cough, mucus production, fatigue, or wheezing occurs.  Maintain up to date vaccinations, including influenza, COVID, and pneumococcal.  Wash your hands often and avoid sick exposures.  Encouraged masking in crowds.  Avoid triggers, when possible.  Exercise, as tolerated. Notify if worsening symptoms upon exertion occur.    Chest x ray today. We will notify you of results.   Follow up in one month with [name], Roxan Diesel, NP, or APP. If symptoms do not improve or worsen, please contact office for sooner follow up or seek emergency care.

## 2021-06-03 NOTE — Assessment & Plan Note (Addendum)
No maintenance inhaler for approx 6 months. Recent sick exposure. Combination likely led to current COPD exacerbation. Abx and prednisone taper pack rx. Supportive care measures. Re-educated on need for maintenance medications and their importance. Pt verbalized understanding and agreed to resume daily therapy with Spiriva. Close follow up to assess compliance. CXR today showed no acute cardiopulmonary disease.  Patient Instructions  -Continue albuterol inhaler 2 puffs every six hours as needed for shortness of breath or wheezing -Restart Spiriva Respimat 2 puffs daily. Continue your maintenance inhaler daily to help decrease your risk of future COPD exacerbations.  -Continue Prilosec 40 mg daily  -Augmentin Twice daily for 7 days. Notify immediately of any rash, itching, hives, or swelling, or seek emergency care. Finish your antibiotics in their entirety. Do not stop just because symptoms improve. Take with food to reduce GI upset.  -Probiotic over the counter daily while taking Augmentin  -Prednisone taper pack. 4 tabs for 2 days, then 3 tabs for 2 days, 2 tabs for 2 days, then 1 tab for 2 days, then stop. Take with food.   -Flonase nasal spray 2 sprays each nostril daily until symptoms improve then 1 spray each nostril as needed for congestion or post nasal drip -Saline nasal spray 2-3 times a day  -Continue Mucinex D over the counter twice a day  -Continue Delsym over the counter as directed for cough -Chlortab 4 mg over the counter At bedtime for cough   Notify if worsening breathlessness, cough, mucus production, fatigue, or wheezing occurs.  Maintain up to date vaccinations, including influenza, COVID, and pneumococcal.  Wash your hands often and avoid sick exposures.  Encouraged masking in crowds.  Avoid triggers, when possible.  Exercise, as tolerated. Notify if worsening symptoms upon exertion occur.    Chest x ray today. We will notify you of results.   Follow up in one month  with [name], Roxan Diesel, NP, or APP. If symptoms do not improve or worsen, please contact office for sooner follow up or seek emergency care.

## 2021-06-03 NOTE — Assessment & Plan Note (Signed)
Supportive care. See above plan.

## 2021-06-03 NOTE — Telephone Encounter (Signed)
I called the patient and he reports that he is having some cough and congestions. He feels his throat is sore from all the drainage. He has a follow up in the office to be evaluated with Roxan Diesel NP this afternoon. Nothing further needed.

## 2021-06-08 ENCOUNTER — Telehealth (HOSPITAL_COMMUNITY): Payer: Self-pay | Admitting: Family Medicine

## 2021-06-10 ENCOUNTER — Telehealth: Payer: Self-pay | Admitting: Nurse Practitioner

## 2021-06-10 DIAGNOSIS — J849 Interstitial pulmonary disease, unspecified: Secondary | ICD-10-CM

## 2021-06-10 DIAGNOSIS — J439 Emphysema, unspecified: Secondary | ICD-10-CM

## 2021-06-12 ENCOUNTER — Telehealth (HOSPITAL_COMMUNITY): Payer: Self-pay

## 2021-06-12 ENCOUNTER — Telehealth: Payer: Self-pay | Admitting: Nurse Practitioner

## 2021-06-12 ENCOUNTER — Other Ambulatory Visit: Payer: Self-pay

## 2021-06-12 NOTE — Telephone Encounter (Signed)
Spoke with pt and reviewed Belenda Cruise Cobb's recommendations. Pt stated understanding. Nothing further needed at this time.

## 2021-06-12 NOTE — Telephone Encounter (Signed)
yes

## 2021-06-12 NOTE — Telephone Encounter (Signed)
Spoke with pt who states he has been sick for 12 days but today he has noticed increase in light headiness and SOB. Pt is also c/o right side heart  pain x 2 days. Pt is scheduled for OV on Tuesday 06/16/21. Pt denies fever/ chills/ wheezing/ GI upset. Katharine Cobb please advise.

## 2021-06-12 NOTE — Telephone Encounter (Signed)
Pulmonary rehab office referral recv'ed, printed and given to RN for review. 

## 2021-06-12 NOTE — Telephone Encounter (Signed)
MR, please advise if you are okay with [place] referring pt to pulm rehab.

## 2021-06-12 NOTE — Telephone Encounter (Signed)
Called and spoke with pt's spouse letting her know that MR was fine with [place] placing referral for pulm rehab and she verbalized understanding. Order has been placed. Nothing further needed.

## 2021-06-12 NOTE — Telephone Encounter (Signed)
Called patient to see if he is interested in the Pulmonary Rehab Program. Patient expressed interest. Explained scheduling process and went over insurance process, patient verbalized understanding. Someone from our pulmonary rehab staff will contact pt at a later time. °

## 2021-06-12 NOTE — Telephone Encounter (Signed)
Please have patient go to the ED for further evaluation if he is having worsening symptoms along with new chest pain. Thanks!

## 2021-06-15 ENCOUNTER — Encounter: Payer: Self-pay | Admitting: Cardiovascular Disease

## 2021-06-15 NOTE — Telephone Encounter (Signed)
Error

## 2021-06-16 ENCOUNTER — Other Ambulatory Visit: Payer: Self-pay | Admitting: *Deleted

## 2021-06-16 ENCOUNTER — Other Ambulatory Visit: Payer: Self-pay

## 2021-06-16 ENCOUNTER — Ambulatory Visit (INDEPENDENT_AMBULATORY_CARE_PROVIDER_SITE_OTHER): Payer: Medicare Other | Admitting: Primary Care

## 2021-06-16 ENCOUNTER — Encounter: Payer: Self-pay | Admitting: Primary Care

## 2021-06-16 DIAGNOSIS — J441 Chronic obstructive pulmonary disease with (acute) exacerbation: Secondary | ICD-10-CM | POA: Diagnosis not present

## 2021-06-16 DIAGNOSIS — J432 Centrilobular emphysema: Secondary | ICD-10-CM | POA: Diagnosis not present

## 2021-06-16 MED ORDER — ALBUTEROL SULFATE HFA 108 (90 BASE) MCG/ACT IN AERS: 1.0000 | INHALATION_SPRAY | Freq: Four times a day (QID) | RESPIRATORY_TRACT | 2 refills | Status: DC | PRN

## 2021-06-16 MED ORDER — SPIRIVA RESPIMAT 2.5 MCG/ACT IN AERS: 2.0000 | INHALATION_SPRAY | Freq: Every day | RESPIRATORY_TRACT | 0 refills | Status: DC

## 2021-06-16 NOTE — Assessment & Plan Note (Signed)
-   Continue Spiriva Respimat 2.89mcg two puffs daily

## 2021-06-16 NOTE — Assessment & Plan Note (Addendum)
-   Seen on 11/30 for COPD exacerbation. Symptoms clinically improved after completing Augmentin and prednisone course  - Advised he take regular mucinex 600mg  twice daily as needed only for chest congestion and Delsym cough syrup q 12 hours as needed for cough suppression - Patient will notify office if he develops purulent cough or worsening respiratory symptoms, we may want to consider adding daliresp if continues to have exacerbation while on maintenance inhaler regimen - Needs update PFTs to monitor lung function

## 2021-06-16 NOTE — Patient Instructions (Addendum)
°  Recommendations: - Continue Spiriva Respimat 2.66mcg- take two puffs daily in the morning (RX has been sent to pharmacy, do not take more than prescribed) - For chest congestion, take regular mucinex 600mg  twice daily as needed only  - For cough suppression, take delsym cough syrup twice daily as needed only  - Call if you develop purulent cough or worsening respiratory symptoms   Follow-up: - Please schedule PFT first available and follow up with [name] afterwards

## 2021-06-16 NOTE — Progress Notes (Signed)
@Patient  ID: [name], male    DOB: 1951/01/11, 70 y.o.   MRN: [id number]  Chief Complaint  Patient presents with   Follow-up    Referring provider: Jonathon Jordan, MD   History of Present Illness: 70 year old male, former smoker quit in 1989 (36-pack-year history).  Significant for interstitial lung disease, bronchiectasis, COPD/emphysema, chronic respiratory failure with hypoxia.  Patient of [name].   Previous [facility] pulmonary encounter: 03/25/2020  - Visit  former smoker followed in our office for chronic respiratory failure and COPD.  Followed by [name] patient was last seen in our office on 02/13/2020.  He was seen by TP NP at that office visit is recommended the patient be treated with Augmentin, prednisone, add Yupelri nebulized medication, obtain pulmonary function testing.   Patient reports that he is feeling significantly better since last being treated in August/2021.  He feels he is back to his baseline.  He is not still currently using Yupelri.  He is unsure if this actually helped him.  He reports it is difficult for him to assess given the fact that he was being acutely treated as a COPD exacerbation while he was trialed on it.  Patient would like to review most recent pulmonary function test today.  He reports that most recent plan as discussed with him by [name] was to repeat a CT scan next year to follow the February/2021 HRCT.   08/05/2020  Patient contacted today to review recent PFTs. He is doing well today. He had a cough x1 month with associated wheezing for 5 days. His cough has mostly resolved. He still has some shortness of breath. He stopped using yupeli. He did not find this helpful. In the past insurance has not covered Symbicort and is not covering Albuterol. Pulmonary function testing appears stable. FENO was mildly elevated at 30. He has a follow-up HRCT in February.   09/03/20- [name] 70 y.o. -presents for  follow-up.  He is feeling somewhat better as evidenced by the symptom score below.  However in January he felt quite ill with significant respiratory complaints.  Significant amount of phone calls.  Visits to the ER.  His Covid test was negative but it was in the middle of omicron pandemic.  He had CT angiogram in the ER and ruled out pulmonary embolism.  He had follow-up high-resolution CT chest in February 2022 and is previous ILD that he is very concerned about even though was early/mild has resolved.  I personally visualized the 2 scans and do agree with the radiologist.  I also showed them the scans.  There is only slight evidence of emphysema.  However despite all this improvement it seems that his pulmonary function test is worse.  Do not know with the technique issue.  He still does have some residual shortness of breath however.  Talking to him he tells me that back in January 2020 before his problem started he had viral infection.  He believes it was COVID-19.  Although COVID-19 officially was not in [place] in January 2020.  He believes he had a positive test but I do not see records of this.  In any event he is better now.  Also there was some mold exposure in his house he is got this cleaned up.  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  11/14/2020- 8 week FU, Volanda Napoleon NP Patient presents today for 8 week follow-up. He  has had a cough for 2 weeks. He reports a pattern of having a cyclic cough every 10 days. Cough is mostly dry with occasional thick clear mucus. Last week he was getting up purulent mucus. He was treated with azithromycin on 11/05/20. He has some nasal congestion which he takes mucinex for. CXR yesterday showed stable chronic interstitial prominence. He has arthritic pain in his hands. RF has been elevated in the past and he saw [name] 1-2 years ago. HRCT in February 2022 did not show evidence of ILD. It did show mild  tubular bronchiectasis and mild paraseptal emphysema.   11/26/2020- Acute OV/Bilbo Carcamo, NP Patient was contacted today for acute visit. He continues to have a dry cough which worsened with tessalon perles. He is experiencing coughing spasms.  Associated fatigue and weakness. He denies aspiration. His wife had bronchitis. He has had 4 negative covid tests. He is compliant with Spiriva and Ventolin. CXR on 11/13/20 showed chronic interstitial prominence. PFTs in the past have shown mild-moderate restriction. No post BD done. Denies fever, chills or sweats.   06/03/2021: ACUTE OV/ Cobb, NP Patient presents for reported increased congestion and cough. Symptoms started on Sunday night after he was around his grandchildren whom are sick. He reports increased purulent sputum production and rhinorrhea with purulent exudate. He denies any fevers/chills, shortness of breath, wheezing, orthopnea, PND, or leg swelling. He tested negative for the Flu, RSV and COVID Monday. He has been taking Mucinex D and delsym over the counter with moderate relief of symptoms. He has not felt the need to use his rescue inhaler. He also reports that he stopped his Spiriva 6 months ago and states he didn't think he needed to continue it since his breathing had improved. Overall, he does not feel well.    06/16/2021- INTERIM HX  Seen on 11/30 for COPD exacerbation. He had not been on maintenance inhaler for 6 months at that time. He was advised to resume Spiriva Respimat. Augmentin and prednisone course cleared up cough and chest congestion. He did have some associated diarrhea when taking abx which has resolved. He is left with some lightheadedness, weakness and fatigue. He continues to have some shortness of breath with exertion only. He is taking Spiriva Respimat as prescribed, he was given sample at last visit. He is not sure if he can afford but prescription has been sent to pharmacy.  He ambulates with cane. Denies falls.   Allergies   Allergen Reactions   Bupropion Other (See Comments)    Caused altered mental status Altered mental status Caused altered mental status   Ciprofloxacin-Dexamethasone Other (See Comments)    Burning in ears when Cipro ear drops were used unknown Burning in ears when Cipro ear drops were used   Gabapentin Other (See Comments)    Caused weakness, dizziness, and forgetfulness  Weakness and dizziness with higher doses. Weakness and dizziness Caused weakness, dizziness, and forgetfulness   Ciprofloxacin Hcl Other (See Comments)    Burning in ears when drops were used   Clonidine Hcl Other (See Comments)    Immunization History  Administered Date(s) Administered   H1N1 04/25/2008   Influenza Split 04/25/2008, 04/08/2009, 04/03/2010, 04/02/2011, 04/18/2012, 04/27/2013, 04/05/2015, 03/30/2019   Influenza, High Dose Seasonal PF 03/30/2016   Influenza,inj,Quad PF,6+ Mos 04/02/2016, 03/03/2017, 04/28/2018, 04/04/2019, 03/20/2020   Influenza,inj,quad, With Preservative 04/15/2015   Influenza-Unspecified 03/26/2021   PFIZER(Purple Top)SARS-COV-2 Vaccination 08/12/2019, 09/05/2019, 04/15/2020, 10/13/2020   Pfizer Covid-19 Vaccine Bivalent Booster 13yrs & up 03/17/2021   Pneumococcal  Conjugate-13 05/08/2015   Pneumococcal Polysaccharide-23 10/24/2007, 03/03/2017   Td 03/26/2004   Tdap 03/13/2013   Zoster, Live 03/13/2013, 05/25/2021    Past Medical History:  Diagnosis Date   Arthritis    Bronchitis    history of   Dysrhythmia    ":Due to MVP"   GERD (gastroesophageal reflux disease)    Hiatal hernia    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 last stress test 2003   Pneumonia 2009   Tumor  cells, benign    lung, left side   Unstable angina ([place]) 10/28/2017    Tobacco History: Social History   Tobacco Use  Smoking Status Former   Packs/day: 1.50   Years: 24.00   Pack years: 36.00   Types: Cigarettes   Start date: 1966   Quit date: 07/05/1985   Years since quitting: 35.9  Smokeless Tobacco Never   Counseling given: Not Answered   Outpatient Medications Prior to Visit  Medication Sig Dispense Refill   aspirin 81 MG chewable tablet Chew 81 mg by mouth daily.     celecoxib (CELEBREX) 200 MG capsule 1 capsule with food as needed     famotidine (PEPCID) 20 MG tablet Take 1 tablet (20 mg total) by mouth at bedtime. 30 tablet 3   fluticasone (FLONASE) 50 MCG/ACT nasal spray Place 1 spray into both nostrils daily. 18.2 mL 2   methylphenidate (RITALIN) 10 MG tablet Take 10 mg by mouth 3 (three) times daily.     mirtazapine (REMERON) 15 MG tablet Take 15 mg by mouth at bedtime.     Multiple Vitamin (MULTIVITAMIN) tablet Take 1 tablet by mouth daily.     nitroGLYCERIN (NITROSTAT) 0.4 MG SL tablet Place 0.4 mg under the tongue every 5 (five) minutes as needed for chest pain.     NONFORMULARY OR COMPOUNDED ITEM Antifungal solution: Terbinafine 3%, Fluconazole 2%, Tea Tree Oil 5%, Urea 10%, Ibuprofen 2% in DMSO suspension #61mL 1 each 3   omeprazole (PRILOSEC) 40 MG capsule Take 40 mg by mouth every morning.     polyethylene glycol (MIRALAX / GLYCOLAX) packet Take 17 g by mouth daily as needed (for constipation.).      rosuvastatin (CRESTOR) 10 MG tablet TAKE 1 TABLET(10 MG) BY MOUTH DAILY 90 tablet 3   sulfacetamide (BLEPH-10) 10 % ophthalmic solution 4 drops to left ear twice daily     SYNTHROID 25 MCG tablet Take 25 mcg by mouth daily before breakfast.      tamsulosin (FLOMAX) 0.4 MG CAPS capsule Take 0.4 mg by mouth daily after breakfast.      Tiotropium Bromide Monohydrate (SPIRIVA RESPIMAT) 2.5 MCG/ACT AERS Inhale 2 puffs into the lungs daily. 4 g 2   albuterol (VENTOLIN  HFA) 108 (90 Base) MCG/ACT inhaler Inhale 1-2 puffs into the lungs every 6 (six) hours as needed for wheezing or shortness of breath. 18 g 2   amoxicillin-clavulanate (AUGMENTIN) 875-125 MG tablet Take 1 tablet by mouth 2 (two) times daily. (Patient not taking: Reported on 06/16/2021) 14 tablet 0   budesonide-formoterol (SYMBICORT) 80-4.5 MCG/ACT inhaler 1 puff (Patient not taking: Reported on 06/16/2021)  methylphenidate (RITALIN) 10 MG tablet 1 tablet on empty stomach     predniSONE (DELTASONE) 10 MG tablet 4 tabs for 2 days, then 3 tabs for 2 days, 2 tabs for 2 days, then 1 tab for 2 days, then stop (Patient not taking: Reported on 06/16/2021) 20 tablet 0   No facility-administered medications prior to visit.   Review of Systems  Review of Systems  Constitutional:  Positive for fatigue.  HENT:  Negative for congestion.   Respiratory:  Positive for shortness of breath. Negative for cough, chest tightness and wheezing.   Neurological:  Positive for weakness and light-headedness.    Physical Exam  BP 106/60 (BP Location: Left Arm, Patient Position: Sitting, Cuff Size: Normal)    Pulse 71    Temp 98 F (36.7 C) (Oral)    Ht 5' 11.5" (1.816 m)    Wt 176 lb 12.8 oz (80.2 kg)    SpO2 99%    BMI 24.31 kg/m  Physical Exam Constitutional:      Appearance: Normal appearance. He is not ill-appearing.  HENT:     Head: Normocephalic and atraumatic.     Nose: No congestion.  Cardiovascular:     Rate and Rhythm: Normal rate and regular rhythm.  Pulmonary:     Effort: Pulmonary effort is normal.     Breath sounds: Normal breath sounds. No wheezing or rales.     Comments: CTA Musculoskeletal:        General: Normal range of motion.     Cervical back: Normal range of motion and neck supple.  Skin:    General: Skin is warm and dry.  Neurological:     General: No focal deficit present.     Mental Status: He is alert and oriented to person, place, and time. Mental status is at baseline.   Psychiatric:        Mood and Affect: Mood normal.        Behavior: Behavior normal.        Thought Content: Thought content normal.        Judgment: Judgment normal.     Lab Results:  CBC    Component Value Date/Time   WBC 6.4 07/18/2020 1146   RBC 4.68 07/18/2020 1146   HGB 15.0 07/18/2020 1146   HGB 15.4 08/17/2019 1517   HCT 43.3 07/18/2020 1146   PLT 174.0 07/18/2020 1146   PLT 112 (L) 08/17/2019 1517   MCV 92.5 07/18/2020 1146   MCH 30.5 08/17/2019 1517   MCHC 34.6 07/18/2020 1146   RDW 13.2 07/18/2020 1146   LYMPHSABS 1.8 07/18/2020 1146   MONOABS 0.6 07/18/2020 1146   EOSABS 0.2 07/18/2020 1146   BASOSABS 0.0 07/18/2020 1146    BMET    Component Value Date/Time   NA 142 07/18/2020 1146   NA 142 02/13/2018 0851   K 4.2 07/18/2020 1146   CL 104 07/18/2020 1146   CO2 29 07/18/2020 1146   GLUCOSE 90 07/18/2020 1146   BUN 22 07/18/2020 1146   BUN 16 02/13/2018 0851   CREATININE 1.03 07/18/2020 1146   CREATININE 1.01 08/17/2019 1517   CALCIUM 9.0 07/18/2020 1146   CALCIUM 9.0 02/19/2011 1233   GFRNONAA >60 08/17/2019 1517   GFRAA >60 08/17/2019 1517    BNP    Component Value Date/Time   BNP 42.2 06/19/2019 0902    ProBNP    Component Value Date/Time   PROBNP 57.0 07/18/2020 1146    Imaging: DG Chest 2 View  Result Date: 06/03/2021 CLINICAL DATA:  Cough with increased purulence sputum production. EXAM: CHEST - 2 VIEW COMPARISON:  11/13/2020 FINDINGS: Normal cardiomediastinal contours. Diffuse interstitial prominence is again noted in appears similar to the previous exam. No superimposed pleural effusion, interstitial edema or airspace consolidation. Degenerative disc disease noted within the thoracic spine. IMPRESSION: 1. No acute cardiopulmonary abnormalities. Electronically Signed   By: Kerby Moors M.D.   On: 06/03/2021 14:40     Assessment & Plan:   COPD with acute exacerbation ([place]) - Seen on 11/30 for COPD exacerbation. Symptoms  clinically improved after completing Augmentin and prednisone course  - Advised he take regular mucinex 600mg  twice daily as needed only for chest congestion and Delsym cough syrup q 12 hours as needed for cough suppression - Patient will notify office if he develops purulent cough or worsening respiratory symptoms, we may want to consider adding daliresp if continues to have exacerbation while on maintenance inhaler regimen - Needs update PFTs to monitor lung function   Centrilobular emphysema (HCC) - Continue Spiriva Respimat 2.21mcg two puffs daily   Martyn Ehrich, NP 06/16/2021

## 2021-07-03 ENCOUNTER — Ambulatory Visit: Payer: Medicare Other | Admitting: Nurse Practitioner

## 2021-07-09 ENCOUNTER — Other Ambulatory Visit: Payer: Self-pay | Admitting: Nurse Practitioner

## 2021-07-09 DIAGNOSIS — J439 Emphysema, unspecified: Secondary | ICD-10-CM

## 2021-07-13 ENCOUNTER — Other Ambulatory Visit: Payer: Self-pay

## 2021-07-13 ENCOUNTER — Ambulatory Visit (INDEPENDENT_AMBULATORY_CARE_PROVIDER_SITE_OTHER): Payer: Medicare Other | Admitting: Podiatry

## 2021-07-13 ENCOUNTER — Encounter: Payer: Self-pay | Admitting: Podiatry

## 2021-07-13 DIAGNOSIS — M79674 Pain in right toe(s): Secondary | ICD-10-CM | POA: Diagnosis not present

## 2021-07-13 DIAGNOSIS — B351 Tinea unguium: Secondary | ICD-10-CM

## 2021-07-13 DIAGNOSIS — D131 Benign neoplasm of stomach: Secondary | ICD-10-CM | POA: Insufficient documentation

## 2021-07-13 DIAGNOSIS — M79675 Pain in left toe(s): Secondary | ICD-10-CM | POA: Diagnosis not present

## 2021-07-13 NOTE — Progress Notes (Signed)
Patient ID: [name], male   DOB: [birth year], 71 y.o.   MRN: [id number]  Encounter created in error by CMA.

## 2021-07-13 NOTE — Progress Notes (Signed)
Encounter created in error by CMA.

## 2021-07-13 NOTE — Progress Notes (Signed)
°  Subjective:  Patient ID: [name], male    DOB: [birth year],  MRN: [id number]  [name] presents to clinic today for painful elongated mycotic toenails 1-5 bilaterally which are tender when wearing enclosed shoe gear. Pain is relieved with periodic professional debridement.  Patient states he was diagnosed with cancer since his last visit. He has started chemotherapy and noticed darker discoloration of toenails on his left foot. Patient also has been diagnosed with rheumatoid arthritis.  PCP is Jonathon Jordan, MD , and last visit was 05/25/2021.  Allergies  Allergen Reactions   Bupropion Other (See Comments)    Caused altered mental status Altered mental status Caused altered mental status   Ciprofloxacin-Dexamethasone Other (See Comments)    Burning in ears when Cipro ear drops were used unknown Burning in ears when Cipro ear drops were used   Gabapentin Other (See Comments)    Caused weakness, dizziness, and forgetfulness  Weakness and dizziness with higher doses. Weakness and dizziness Caused weakness, dizziness, and forgetfulness   Ciprofloxacin Hcl Other (See Comments)    Burning in ears when drops were used   Clonidine Hcl Other (See Comments)    Review of Systems: Negative except as noted in the HPI. Objective:   Constitutional [name] is a pleasant 71 y.o. Caucasian male, WD, WN in NAD. AAO x 3.   Vascular CFT <3 seconds b/l LE. Palpable DP/PT pulses b/l LE. Digital hair present b/l. Skin temperature gradient WNL b/l. No pain with calf compression b/l. No edema noted b/l. No cyanosis or clubbing noted b/l LE.  Neurologic Normal speech. Oriented to person, place, and time. Protective sensation intact 5/5 intact bilaterally with 10g monofilament b/l. Vibratory sensation intact b/l.  Dermatologic Pedal integument with normal turgor, texture and tone b/l LE. No open wounds b/l. No interdigital macerations b/l. Toenails 1-5 b/l elongated, thickened,  discolored with subungual debris. +Tenderness with dorsal palpation of nailplates. No hyperkeratotic or porokeratotic lesions present.  Orthopedic: Normal muscle strength 5/5 to all lower extremity muscle groups bilaterally. No pain, crepitus or joint limitation noted with ROM b/l LE. No gross bony pedal deformities b/l. Patient ambulates independently without assistive aids.   Radiographs: None   Assessment:   1. Pain due to onychomycosis of toenails of both feet    Plan:  Patient was evaluated and treated and all questions answered. Consent given for treatment as described below: -Regarding changes in toenails, he may have had a remote reaction to the chemotherapy. No acute findings today. -Patient to continue soft, supportive shoe gear daily. -Mycotic toenails 1-5 bilaterally were debrided in length and girth with sterile nail nippers and dremel without incident. -Patient/POA to call should there be question/concern in the interim.  Return in about 3 months (around 10/11/2021).  Marzetta Board, DPM

## 2021-07-16 IMAGING — DX DG CHEST 1V PORT
1 series · 1 of 1 positions shown · non-contrast
Comparison: 06/29/2018.

CLINICAL DATA: Pt presents with c/o cough for the past 6 days. Pt
reports he had Covid earlier this year but has a problem with a
cough like he has right now, chronic bronchitis. Hx of pneumonia,
[id number].

EXAM:
PORTABLE CHEST 1 VIEW

[chest ap]
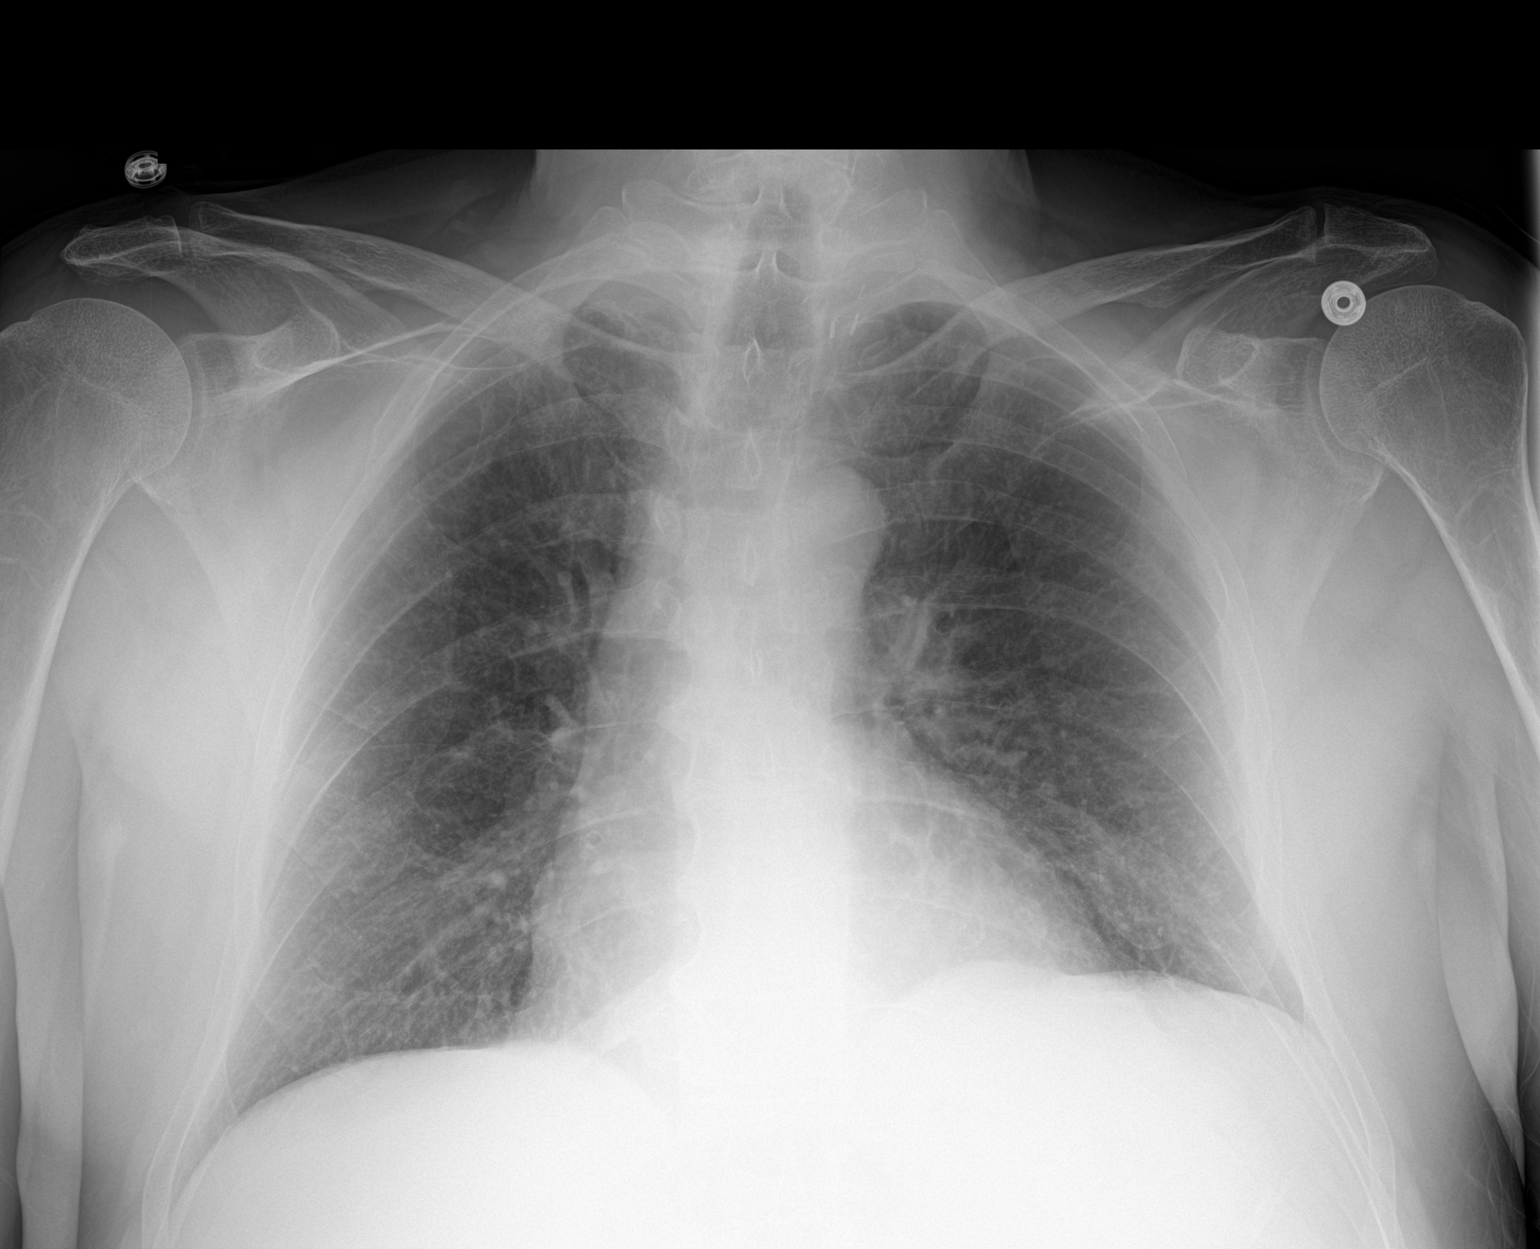

[1 of 1 positions shown; findings below may reference images not displayed]

FINDINGS: Cardiac silhouette is normal in size. No mediastinal or hilar
masses. No evidence of adenopathy.

Prominent bronchovascular markings, stable. Stable scarring at the
lung apices, greater on the right. No evidence of pneumonia or
pulmonary edema. No pleural effusion or pneumothorax.

Skeletal structures are grossly intact.
IMPRESSION: No active disease.

## 2021-07-30 ENCOUNTER — Encounter: Payer: Self-pay | Admitting: Cardiovascular Disease

## 2021-07-30 NOTE — Progress Notes (Signed)
Cardiology Office Note   Date:  07/31/2021   ID:  [name], DOB 10/27/1950, MRN [id number]  PCP:  Jonathon Jordan, MD  Cardiologist:   Mertie Moores, MD   Chief Complaint  Patient presents with   Chest Pain   Shortness of Breath   Problem List 1. Chest pain 2. Hiatal hernia 3. GERD 4. Barretts esophagitis    [name] is a 71 y.o. male who presents for evaluation of his CP Would wake him up at night.  Not associated with eating or drinking. Various times of the day  Does not exercise so he does not know if it is exacerbated with exertion .  Would become very fatigued with household chores but not cause chest pain  for the past year.  Gets very tired vaccuming 1/2 of the appt.  Walks the dog occasionally . Used to walk 1 1/2 miles a day - stopped walking this in may , 2015  Pains will ast 25-45 minutes.  sulcrafate has helped the pains quite a bit.    October 27, 2017:  [name] is seen today for urgent work in for episodes of chest discomfort.  Is been seeing Dr. Oletta Lamas for a GI evaluation and evaluation is been relatively negative.  Has been having lots of chest pain .   The thought it was GERD For the past week or so , has moderate CP  3-4 / 10  Last 15- 20 minutes Associated with nausea  Does not exercise much but pain does not seem to be exertional . In 2017, he had flu shot and pneumonia shot.  Had a pneumonia like illness folowing that , lost 100 lbs  Was not able to walk  Has recovered well.   Still is slightly weak.   Does not have much stamina   I saw him in 2016,    GXT was normal  Echocardiogram in September, 2017 reveals normal left ventricular systolic function.  Ejection fraction is 50 to 55%.  Nov 15, 2017:  [name] is seen for follow-up.  He has a family history of coronary artery disease and has hyperlipidemia. [name] called after his last visit was complaining of more chest discomfort.  We schedule him for a heart catheterization.    Heart cath revealed mild coronary artery disease involving the LAD, circumflex vessel and right coronary artery.  The cath site was initially attempted from the right radial artery and was later transitioned to the right femoral artery.  He still has some right arm pain and tenderness.  No further episodes of CP.  No real exercise.  Still has episodes of chest "presence" that then worsens to become a burning  Has had an ECG - no esophageal issues  Is not sleeping well   April 03, 2019:  [name] is seen today for follow-up of his mild coronary artery disease and hyperlipidemia.  Was seen with wife,  [name].  Was diagnosed with interstitial lung disease  Had covid he thinks ( was sick for 2 months )  Has tested + for antibodies.  Still gets out of breath   Had an episode of tachycardia with minimal exercions  Echo in July, 2020 shows normal LV function .   Jan. 27, 2023 [name] is seen today for his mild CAD, HLD Having some dizziness with exertion and when he stands up .  Had some R sided chest pain   Echocardiogram from May, 2021 reveals normal left ventricular systolic function.  His RV  function and RV pressures were normal at that time.  Past Medical History:  Diagnosis Date   Arthritis    Bronchitis    history of   Dysrhythmia    ":Due to MVP"   GERD (gastroesophageal reflux disease)    Hiatal hernia    Hip joint replacement by other means 2004   History of echocardiogram    Echo 11/2019: EF 65-70, no R WMA, mild concentric LVH, normal RV SF, normal PASP (RVSP 19.2 mmHg), trivial MR   Hypercalcemia    Lipoma    left forearm (3)   Malignant melanoma of skin ([place]) 06/23/2020   Mitral valve prolapse    does not see cardiologist for. last stress test 2003   Pneumonia 2009   Tumor cells, benign    lung, left side   Unstable angina ([place]) 10/28/2017    Past Surgical History:  Procedure Laterality Date   APPENDECTOMY  1984   CATARACT EXTRACTION     L and R eye    CHOLECYSTECTOMY  2002   HYDROCELE EXCISION Right 11/04/2016   Procedure: HYDROCELECTOMY ADULT;  Surgeon: Franchot Gallo, MD;  Location: [facility];  Service: Urology;  Laterality: Right;   INGUINAL HERNIA REPAIR Bilateral 11/04/2016   Procedure: HERNIA REPAIR INGUINAL ADULT BILATERAL;  Surgeon: Jackolyn Confer, MD;  Location: [facility];  Service: General;  Laterality: Bilateral;   INSERTION OF MESH Bilateral 11/04/2016   Procedure: INSERTION OF MESH;  Surgeon: Jackolyn Confer, MD;  Location: [facility];  Service: General;  Laterality: Bilateral;   JOINT REPLACEMENT     Lt hip   LEFT HEART CATH AND CORONARY ANGIOGRAPHY N/A 10/31/2017   Procedure: LEFT HEART CATH AND CORONARY ANGIOGRAPHY;  Surgeon: Troy Sine, MD;  Location: [facility];  Service: Cardiovascular;  Laterality: N/A;   MASTOIDECTOMY  1996   NASAL SEPTUM SURGERY     sinus surgery   PARATHYROIDECTOMY     TOTAL HIP REVISION  06/14/2011   Procedure: TOTAL HIP REVISION;  Surgeon: Kerin Salen;  Location: [facility];  Service: Orthopedics;  Laterality: Left;  Left Acetabular  Hip Revision     Current Outpatient Medications  Medication Sig Dispense Refill   albuterol (VENTOLIN HFA) 108 (90 Base) MCG/ACT inhaler Inhale 1-2 puffs into the lungs every 6 (six) hours as needed for wheezing or shortness of breath. 18 g 2   aspirin 81 MG chewable tablet Chew 81 mg by mouth daily.     celecoxib (CELEBREX) 200 MG capsule 1 capsule with food as needed     famotidine (PEPCID) 20 MG tablet Take 1 tablet (20 mg total) by mouth at bedtime. 30 tablet 3   fluticasone (FLONASE) 50 MCG/ACT nasal spray Place 1 spray into both nostrils daily. 18.2 mL 2   methylphenidate (RITALIN) 10 MG tablet 1 tablet on empty stomach     metoprolol tartrate (LOPRESSOR) 25 MG tablet Take 25 mg by mouth daily.     mirtazapine (REMERON) 15 MG tablet Take 15 mg by mouth at bedtime.     Multiple Vitamin (MULTIVITAMIN) tablet Take 1 tablet by mouth daily.     omeprazole  (PRILOSEC) 40 MG capsule Take 40 mg by mouth every morning.     polyethylene glycol (MIRALAX / GLYCOLAX) packet Take 17 g by mouth daily as needed (for constipation.).      rosuvastatin (CRESTOR) 10 MG tablet TAKE 1 TABLET(10 MG) BY MOUTH DAILY 90 tablet 3   SYNTHROID 25 MCG tablet Take 25 mcg by  mouth daily before breakfast.      tamsulosin (FLOMAX) 0.4 MG CAPS capsule Take 0.4 mg by mouth daily after breakfast.      Tiotropium Bromide-Olodaterol (STIOLTO RESPIMAT) 2.5-2.5 MCG/ACT AERS Inhale 2 puffs into the lungs daily. 4 g 0   No current facility-administered medications for this visit.    Allergies:   Bupropion, Ciprofloxacin-dexamethasone, Gabapentin, Ciprofloxacin hcl, and Clonidine hcl    Social History:  The patient  reports that he quit smoking about 36 years ago. His smoking use included cigarettes. He started smoking about 57 years ago. He has a 36.00 pack-year smoking history. He has never used smokeless tobacco. He reports current alcohol use. He reports that he does not use drugs.   Family History:  The patient's family history includes Cancer in his brother; Healthy in his daughter, daughter, and son; Heart Problems in his father and mother; Lung cancer (age of onset: 94) in his father; Neuropathy in his sister.    ROS:   Noted in current history, otherwise review of systems is negative.  Physical Exam: Blood pressure 128/74, pulse 81, height 6' (1.829 m), weight 181 lb 9.6 oz (82.4 kg), SpO2 99 %.  GEN:  Well nourished, well developed in no acute distress HEENT: Normal NECK: No JVD; No carotid bruits LYMPHATICS: No lymphadenopathy CARDIAC: RRR , no murmurs, rubs, gallops RESPIRATORY:  Clear to auscultation without rales, wheezing or rhonchi  ABDOMEN: Soft, non-tender, non-distended MUSCULOSKELETAL:  No edema; No deformity  SKIN: Warm and dry NEUROLOGIC:  Alert and oriented x 3    EKG:     Jan. 27, 2024:  NSR at 81.  No ST or T wave abn.    Recent  Labs: 08/05/2020: TSH 3.510    Lipid Panel    Component Value Date/Time   CHOL 134 02/13/2018 0851   TRIG 137 02/13/2018 0851   HDL 41 02/13/2018 0851   CHOLHDL 3.3 02/13/2018 0851   CHOLHDL 3.4 10/29/2017 0352   VLDL 14 10/29/2017 0352   LDLCALC 66 02/13/2018 0851      Wt Readings from Last 3 Encounters:  07/31/21 181 lb 9.6 oz (82.4 kg)  07/31/21 181 lb (82.1 kg)  06/16/21 176 lb 12.8 oz (80.2 kg)      Other studies Reviewed: Additional studies/ records that were reviewed today include: . Review of the above records demonstrates:    ASSESSMENT AND PLAN:  1.  Tahycardia:   HR is controlled.    2.  Hyperlipidemia:       managed by medical doctor   3.  Noncardiac chest pain :   has occasional R sided chest twinges.  Do not sound like angina .  Has mild CAD by cath in 2019   We will have him return as needed.  I've asked him to call if he has any other cardiac issues    Current medicines are reviewed at length with the patient today.  The patient does not have concerns regarding medicines.  The following changes have been made:  no change  Labs/ tests ordered today include:   Orders Placed This Encounter  Procedures   EKG 12-Lead        Mertie Moores, MD  07/31/2021 3:49 PM    [facility] [place], [place], [place]  [address] Phone: [phone]; Fax: [phone]

## 2021-07-31 ENCOUNTER — Encounter: Payer: Self-pay | Admitting: Cardiovascular Disease

## 2021-07-31 ENCOUNTER — Other Ambulatory Visit: Payer: Self-pay

## 2021-07-31 ENCOUNTER — Ambulatory Visit (INDEPENDENT_AMBULATORY_CARE_PROVIDER_SITE_OTHER): Payer: Medicare Other | Admitting: Internal Medicine

## 2021-07-31 ENCOUNTER — Ambulatory Visit (INDEPENDENT_AMBULATORY_CARE_PROVIDER_SITE_OTHER): Payer: Medicare Other | Admitting: Cardiovascular Disease

## 2021-07-31 ENCOUNTER — Encounter: Payer: Self-pay | Admitting: Internal Medicine

## 2021-07-31 VITALS — BP 110/58 | HR 82 | Temp 98.3°F | Ht 72.0 in | Wt 181.0 lb

## 2021-07-31 VITALS — BP 128/74 | HR 81 | Ht 72.0 in | Wt 181.6 lb

## 2021-07-31 DIAGNOSIS — R0602 Shortness of breath: Secondary | ICD-10-CM

## 2021-07-31 DIAGNOSIS — J40 Bronchitis, not specified as acute or chronic: Secondary | ICD-10-CM

## 2021-07-31 DIAGNOSIS — R942 Abnormal results of pulmonary function studies: Secondary | ICD-10-CM

## 2021-07-31 DIAGNOSIS — I2510 Atherosclerotic heart disease of native coronary artery without angina pectoris: Secondary | ICD-10-CM

## 2021-07-31 DIAGNOSIS — J432 Centrilobular emphysema: Secondary | ICD-10-CM

## 2021-07-31 LAB — PULMONARY FUNCTION TEST
DL/VA % pred: 126 %
DL/VA: 5.04 ml/min/mmHg/L
DLCO cor % pred: 80 %
DLCO cor: 22.06 ml/min/mmHg
DLCO unc % pred: 80 %
DLCO unc: 22.06 ml/min/mmHg
FEF 25-75 Post: 4.22 L/sec
FEF 25-75 Pre: 3.32 L/sec
FEF2575-%Change-Post: 27 %
FEF2575-%Pred-Post: 161 %
FEF2575-%Pred-Pre: 126 %
FEV1-%Change-Post: 6 %
FEV1-%Pred-Post: 77 %
FEV1-%Pred-Pre: 72 %
FEV1-Post: 2.69 L
FEV1-Pre: 2.52 L
FEV1FVC-%Change-Post: 3 %
FEV1FVC-%Pred-Pre: 114 %
FEV6-%Change-Post: 3 %
FEV6-%Pred-Post: 69 %
FEV6-%Pred-Pre: 66 %
FEV6-Post: 3.1 L
FEV6-Pre: 2.99 L
FEV6FVC-%Pred-Post: 105 %
FEV6FVC-%Pred-Pre: 105 %
FVC-%Change-Post: 3 %
FVC-%Pred-Post: 65 %
FVC-%Pred-Pre: 63 %
FVC-Post: 3.1 L
FVC-Pre: 2.99 L
Post FEV1/FVC ratio: 87 %
Post FEV6/FVC ratio: 100 %
Pre FEV1/FVC ratio: 84 %
Pre FEV6/FVC Ratio: 100 %
RV % pred: 68 %
RV: 1.76 L
TLC % pred: 65 %
TLC: 4.84 L

## 2021-07-31 MED ORDER — STIOLTO RESPIMAT 2.5-2.5 MCG/ACT IN AERS: 2.0000 | INHALATION_SPRAY | Freq: Every day | RESPIRATORY_TRACT | 0 refills | Status: DC

## 2021-07-31 NOTE — Patient Instructions (Signed)
Medication Instructions:  Your physician recommends that you continue on your current medications as directed. Please refer to the Current Medication list given to you today.  *If you need a refill on your cardiac medications before your next appointment, please call your pharmacy*   Lab Work: None ordered  If you have labs (blood work) drawn today and your tests are completely normal, you will receive your results only by: [facility] (if you have MyChart) OR A paper copy in the mail If you have any lab test that is abnormal or we need to change your treatment, we will call you to review the results.   Testing/Procedures: None ordered   Follow-Up: At [facility], you and your health needs are our priority.  As part of our continuing mission to provide you with exceptional heart care, we have created designated Provider Care Teams.  These Care Teams include your primary Cardiologist (physician) and Advanced Practice Providers (APPs -  Physician Assistants and Nurse Practitioners) who all work together to provide you with the care you need, when you need it.  We recommend signing up for the patient portal called "MyChart".  Sign up information is provided on this After Visit Summary.  MyChart is used to connect with patients for Virtual Visits (Telemedicine).  Patients are able to view lab/test results, encounter notes, upcoming appointments, etc.  Non-urgent messages can be sent to your provider as well.   To learn more about what you can do with MyChart, go to [email or web address].    Your next appointment:   AS NEEDED

## 2021-07-31 NOTE — Patient Instructions (Signed)
Full PFT performed today. °

## 2021-07-31 NOTE — Progress Notes (Signed)
OV 10/29/2019  Subjective:  Patient ID: [name], male , DOB: 03-Aug-1950 , age 71 y.o. , MRN: [id number] , ADDRESS: [address] [place] [address]   10/29/2019 -   Chief Complaint  Patient presents with   Follow-up    Pt saw Dr. Halford Chessman 3/23 and Dr. Halford Chessman wanted pt to f/u with MR.  Pt states he has been doing good since last visit. Denies any current complaints with breathing.      HPI [name] 71 y.o. -has been referred to the ILD center.  For evaluation of his interstitial lung disease.  He is a former Neurosurgeon.  He tells me that a few years ago he is to be able to walk 3 miles but now has restricted himself to 1-> 1-1/2 miles.  His work-up resulted in a positive rheumatoid factor documented below.  He was referred to rheumatology.  He does not recollect who he saw but he tells me that there is no evidence of rheumatoid arthritis but he has osteoarthritis.  He is a previous smoker having quit 30 years ago.  But more than 30 pack smoking history.  He had a high-resolution CT chest that has both upper lobe and lower lobe findings.  The lower lobe findings are indeterminate for UIP.  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  There is also associated emphysema.  I personally visualized the images.  He had pulmonary function test that shows restriction with low DLCO.   [facility] Comprehensive ILD Questionnaire - he did one and gave it to NP in feb 2021 who then gave to Rowena but currently cannot find it. So, he agreed to do a redo . This is pending currently  Symptoms:  See below   SYMPTOM SCALE - ILD 10/29/2019   O2 use ra  Shortness of Breath 0 -> 5 scale with 5 being worst (score 6 If unable to do)  At rest 1  Simple tasks - showers, clothes change, eating, shaving 1  Household (dishes, doing bed, laundry) 2  Shopping 1.5  Walking level at own pace 1  Walking up Stairs 2  Total (30-36) Dyspnea Score 8.5  How  bad is your cough? 0  How bad is your fatigue 1  How bad is nausea 0  How bad is vomiting?  0  How bad is diarrhea? 0  How bad is anxiety? 0  How bad is depression 0        Past Medical History :  GERD severe x 40 yers,  Pnuemonia x 3. First as teenager. Heart disease nos x  On 09/03/2020: He said he had confirmed Covid in January 2020 at the onset of the pandemic.  I reclarify this with him.  We do not have testing till the spring 2020 but he says he had confirmed Covid.  I do not see evidence of this in the record.  He did have influenza A in March 2017  ROS:   Faitgued, arthralia x few years with spine stiffness, dysphiag but not in few years.  Does have occasional heartburn for the last several decades.  Is also had oral ulcers on and off occasionally  FAMILY HISTORY of LUNG DISEASE:  -He smoked cigarettes weekly 1967 in 1987 to 30 cigarettes a day.  He smoked marijuana between 1970 1980 frequently in the 1970s.  Never use cocaine abuse intravenous drug use.   EXPOSURE HISTORY:  -[place]  [place] last 4 years.  Age of the home is 25 years.  At an apartment.  Prior to that lived in her own single-family home.  The last 4 years note dampness.  Previously there was mildew that is being replaced.  He does have a humidifier but he cleans it with vinegar.  No CPAP use no nebulizer use.  No C-minus.  No junk see no misting [place].  No pet birds or parakeets.  No pet gerbils or hamsters.  Does not use feather pillows or duvet.  No mold in the [facility] duct.  No music habits.  No guarding habits.  Results for MERVYN, PFLAUM" (MRN [id number]) as of 09/03/2020 11:55  Ref. Range 07/17/2019 14:21  A. Pullulans Abs Latest Ref Range: Negative  Positive (A)   HOME and HOBBY DETAILS :   -  OCCUPATIONAL HISTORY (122 questions) : Work in Proof[employer].  Worked as a Glass blower/designer.  Otherwise negative.  Does not work in a dusty environment.  Never been exposed to  fumes.    PULMONARY TOXICITY HISTORY (27 items): Prednisone routine December January 2021    Simple office walk 185 feet x  3 laps goal with forehead probe 10/29/2019   O2 used ra  Number laps completed 3  Comments about pace moderate  Resting Pulse Ox/HR 99% and 80/min  Final Pulse Ox/HR 99% and 93/min  Desaturated </= 88% no  Desaturated <= 3% points no  Got Tachycardic >/= 90/min yes  Symptoms at end of test Mild dyspena  Miscellaneous comments x    IMPRESSION: HRCT Mediastinum/Nodes: No enlarged mediastinal, hilar, or axillary lymph nodes. Predominantly fat containing hiatal hernia. Thyroid gland, trachea, and esophagus demonstrate no significant findings.   Lungs/Pleura: There is mild, tubular bronchiectasis and mild bronchial wall thickening throughout, with very fine, predominantly peripheral centrilobular nodularity. There is additional, minimal bibasilar irregular interstitial opacity, which is slightly increased in comparison to prior examination dated 12/29/2015. Minimal underlying paraseptal emphysema. No significant air trapping on expiratory phase imaging. There are multiple small bilateral pulmonary nodules measuring up to 6 mm and most numerous in the lung apices, all stable compared to prior examination dated 12/29/2015 and benign. No pleural effusion or pneumothorax.  1. Mild, tubular bronchiectasis and bronchial wall thickening throughout, with very fine, predominantly peripheral centrilobular nodularity in the upper lungs, similar to prior examination. There is additional, minimal bibasilar irregular interstitial opacity, which is slightly increased in comparison to prior examination dated 12/29/2015. These findings are of uncertain significance, and to some extent very likely reflect sequelae of smoking, particularly in the upper lungs. Findings in the lung bases are nonspecific and may reflect bland infectious or inflammatory scarring or  alternately early interstitial lung disease in an "indeterminate for UIP" pattern by ATS pulmonary fibrosis criteria. Consider annual CT follow-up to assess for stability of fibrotic findings and pattern if fibrotic interstitial lung disease remains suspected. 2. Minimal paraseptal emphysema.  Emphysema (ICD10-J43.9). 3. Multiple small bilateral pulmonary nodules measuring up to 6 mm and most numerous in the lung apices, all stable compared to prior examination dated 12/29/2015 and benign. 4. Coronary artery disease.  Aortic Atherosclerosis (ICD10-I70.0).     Electronically Signed   By: Eddie Candle M.D.   On: 08/30/2019 14:12    Results for RYMAN, RATHGEBER" (MRN [id number]) as of 10/29/2019 09:46  Ref. Range 09/25/2019 16:01  FVC-Pre Latest Units: L 3.15  FVC-%Pred-Pre Latest Units: % 65  FEV1-Pre Latest Units: L  2.63  FEV1-%Pred-Pre Latest Units: % 73  Pre FEV1/FVC ratio Latest Units: % 83   Results for JERRI, HARGADON "BOB" (MRN [id number]) as of 10/29/2019 09:46  Ref. Range 09/25/2019 16:01  DLCO cor Latest Units: ml/min/mmHg 21.32  DLCO cor % pred Latest Units: % 76   Results for JAVEION, CANNEDY "BOB" (MRN [id number]) as of 09/03/2020 11:55  Ref. Range 07/17/2019 14:21  A. Pullulans Abs Latest Ref Range: Negative  Positive (A)   ROS - per HPI  Results for BRODRICK, CURRAN" (MRN [id number]) as of 10/29/2019 09:46  Ref. Range 10/24/2007 20:39 09/05/2015 12:13 02/26/2019 15:23 07/17/2019 14:21 07/20/2019 14:55 07/20/2019 14:55 08/17/2019 15:17 10/08/2019 10:11  Anti Nuclear Antibody (ANA) Latest Ref Range: Negative    Negative       Angiotensin-Converting Enzyme Latest Ref Range: 9 - 67 U/L    43      Cyclic Citrullin Peptide Ab Latest Units: UNITS        <16  CCP Antibodies IgG/IgA Latest Ref Range: 0 - 19 units  3        ds DNA Ab Latest Ref Range: 0 - 9 IU/mL  <1        RA Latex Turbid. Latest Ref Range: 0.0 - 13.9 IU/mL  36.8 (H)  575 (H)   206.6 (H)   IgG (Immunoglobin G),  Serum Latest Ref Range: 694 - 1618 mg/dL 653 (L)         IgA Latest Ref Range: 68 - 378 mg/dL 140         Scleroderma (Scl-70) (ENA) Antibody, IgG Latest Ref Range: 0.0 - 0.9 AI  <0.2           OV 12/10/2019 - telephone visit.  Patient identified with 2 person identified.  Wife also came on the phone call.   Subjective:  Patient ID: [name], male , DOB: [birth year] , age 51 y.o. 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  S: doing ok .  Regarding cough:  Cough abated. Not using inahler . Went away after starting spiriva and in a few days resolved. Then stopped spiriva and cough has not recurred. HE is wondering why. Also spiriva costs $220/month. Friend has budesonide/formeterol . I explained this that this is another class. 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  Beyond this we do not have further testing to suggest prognosis.  Therefore biopsy is recommended.  We discussed bronchoscopy as first step with lavage and transbronchial biopsy for RNA genomic analysis.  This case would be under moderate sedation slight/anesthesia versus surgical lung biopsy which would be the gold standard.  Very long discussion about this.  Risks, benefits and limitations explained.  He is going to think about all this do some individual research and call back.    03/25/2020  - Visit  71 year old  male former smoker followed in our office for  chronic respiratory failure and COPD.  Followed by [name] patient was last seen in our office on 02/13/2020.  He was seen by TP NP at that office visit is recommended the patient be treated with Augmentin, prednisone, add Yupelri nebulized medication, obtain pulmonary function testing.   Patient reports that he is feeling significantly better since last being treated in August/2021.  He feels he is back to his baseline.  He is not still currently using Yupelri.  He is unsure if this actually helped him.  He reports it is difficult for him to assess given the fact that he was being acutely treated as a COPD exacerbation while he was trialed on it.  Patient would like to review most recent pulmonary function test today.  He reports that most recent plan as discussed with him by [name] was to repeat a CT scan next year to follow the February/2021 HRCT.   08/05/2020 - Interim hx Patient contacted today to review recent PFTs. He is doing well today. He had a cough x1 month with associated wheezing for 5 days. His cough has mostly resolved. He still has some shortness of breath. He stopped using yupeli. He did not find this helpful. In the past insurance has not covered Symbicort and is not covering Albuterol. Pulmonary function testing appears stable. FENO was mildly elevated at 30. He has a follow-up HRCT in February.     Significant tests:  02/13/2020-chest x-ray-no active cardiopulmonary disease  03/11/2020-pulmonary function test-FVC 3.17 (65% predicted), ratio 83, FEV1 2.64 (74% predicted), DLCO 24.75 (89% predicted)  11/09/2019-echocardiogram-LV ejection fraction 65 to 70%, estimated right ventricular systolic pressure is 64.4  08/30/2019-CT chest high-res-mild tubular bronchiectasis and bronchial wall thickening throughout with very fine predominantly peripheral centrilobular nodularity in the upper lungs, regular interstitial opacity which is  slightly increased, findings are indeterminate for UIP, consider annual CT follow-up to assess for stability of fibrotic findings and pattern of fibrotic interstitial lung disease remains suspected, minimal paraseptal emphysema, multiple small bilateral pulmonary nodules measuring up to 6 mm, CAD  08/05/19- FVC 2.95 (61%), FEV1 2.48 (70%), ratio 84 08/05/19- FENO 30   OV 09/03/2020  Subjective:  Patient ID: [name], male , DOB: 1951/05/28 , age 71 y.o. , MRN: [id number] , ADDRESS: [address] [place] [address] PCP Jonathon Jordan, MD Patient Care Team: Jonathon Jordan, MD as PCP - General (Family Medicine) Nahser, Wonda Cheng, MD as PCP - Cardiology (Cardiology)  This Provider for this visit: Treatment Team:  Attending Provider: Brand Males, MD    09/03/2020 -   Chief Complaint  Patient presents with   Follow-up    Pt is here today to discuss results of recent CT. Pt states he still has SOB with exertion and has an occ cough.     HPI GERSON FAUTH 71 y.o. -presents for follow-up.  He is feeling somewhat better as evidenced by the symptom score below.  However in January he felt quite ill with significant respiratory complaints.  Significant amount of phone calls.  Visits to the ER.  His Covid test was negative but it was in the middle of omicron pandemic.  He had CT angiogram in the ER and ruled out pulmonary embolism.  He had follow-up high-resolution CT chest in February 2022 and is previous ILD that he is very concerned about even though was early/mild has resolved.  I personally visualized the 2 scans and do agree with the radiologist.  I also showed them the  scans.  There is only slight evidence of emphysema.  However despite all this improvement it seems that his pulmonary function test is worse.  Do not know with the technique issue.  He still does have some residual shortness of breath however.  Talking to him he tells me that back in January 2020 before his  problem started he had viral infection.  He believes it was COVID-19.  Although COVID-19 officially was not in [place] in January 2020.  He believes he had a positive test but I do not see records of this.  In any event he is better now.  Also there was some mold exposure in his house he is got this cleaned up.  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   CT Chest data 08/15/20 HRCT   TECHNIQUE: Multidetector CT imaging of the chest was performed following the standard protocol without intravenous contrast. High resolution imaging of the lungs, as well as inspiratory and expiratory imaging, was performed.   COMPARISON:  07/18/2020, 08/30/2019   FINDINGS: Cardiovascular: Scattered aortic atherosclerosis. Normal heart size. Three-vessel coronary artery calcifications. No pericardial effusion.   Mediastinum/Nodes: No enlarged mediastinal, hilar, or axillary lymph nodes. Primarily fat containing hiatal hernia. Thyroid gland, trachea, and esophagus demonstrate no significant findings.   Lungs/Pleura: Scattered tiny centrilobular nodules at the lung apices. Occasional stable, small, definitively benign pulmonary nodules, for example a 6 mm nodule of the left lower lobe (series 9, image 94). Minimal paraseptal emphysema. Unchanged, mild, tubular bronchiectasis. Previously noted irregular peripheral interstitial opacity is not appreciated, possibly resolved partial atelectasis owing to improved lung volumes on current examination. No significant air trapping on expiratory phase imaging. No pleural effusion or pneumothorax.   Upper Abdomen: No acute abnormality.   Musculoskeletal: No chest wall mass or suspicious bone lesions identified.   IMPRESSION: 1. Previously noted irregular peripheral interstitial opacity at the lung bases is not appreciated, possibly resolved partial atelectasis owing to improved lung volumes on  current examination. There is mild tubular bronchiectasis, nonspecific, without evidence of fibrotic interstitial lung disease. 2. Scattered tiny centrilobular nodules at the lung apices, most likely smoking-related respiratory bronchiolitis. 3. Additional stable, small, definitively benign pulmonary nodules for which no further follow-up or characterization is required. 4. Minimal paraseptal emphysema. 5. Coronary artery disease.   Aortic Atherosclerosis (ICD10-I70.0) and Emphysema (ICD10-J43.9).     Electronically Signed   By: Eddie Candle M.D.   On: 08/16/2020 16:19     06/16/2021- INTERIM HX  Seen on 11/30 for COPD exacerbation. He had not been on maintenance inhaler for 6 months at that time. He was advised to resume Spiriva Respimat. Augmentin and prednisone course cleared up cough and chest congestion. He did have some associated diarrhea when taking abx which has resolved. He is left with some lightheadedness, weakness and fatigue. He continues to have some shortness of breath with exertion only. He is taking Spiriva Respimat as prescribed, he was given sample at last visit. He is not sure if he can afford but prescription has been sent to pharmacy.  He ambulates with cane. Denies falls.    OV 07/31/2021  Subjective:  Patient ID: [name], male , DOB: Sep 21, 1950 , age 51 y.o. , MRN: [id number] , ADDRESS: [place] [id number] PCP Jonathon Jordan, MD Patient Care Team: Jonathon Jordan, MD as PCP - General (Family Medicine) Nahser, Wonda Cheng, MD as PCP - Cardiology (Cardiology)  This  Provider for this visit: Treatment Team:  Attending Provider: Brand Males, MD    07/31/2021 -   Chief Complaint  Patient presents with   Follow-up    PFT performed today. Pt states that he has been a little more SOB over the past few months especially when going upstairs.    Frequent episodes of bronchitis  -  remote smoker heavy Restrictive  pulmonary function test with normal DLCO  -No evidence of ILD in February 2022 CT chest Minimal paraseptal emphysema on CT chest February 2022   - On Spiriva Rheumatoid factor positive  -May 2022   HPI [name] 71 y.o. -returns for follow-up.  I personally saw him in March 2022 after that he see nurse practitioners.  He tells me that he has had 4 exacerbations of bronchitis in the last 1 year.  Each of this required antibiotic and prednisone and he will get better.  Review of the medical record shows that sometimes he is not taking Spiriva.  Currently is feeling stable.  He has pulmonary function test today.  It is stable.  Still shows restriction with a normal DLCO suggesting neuromuscular defect.  He has positive rheumatoid factor but he denied any major arthritis today.  His CT scan from a year ago just showed minimal paraseptal emphysema.  We did a COPD CAT symptom score today and it is stable.  Very minimal symptoms only.  He is going to see cardiology for his routine visit today.  We discussed about changing his inhalers to see if this would prevent repeated episodes of bronchitis where he behaves like a classic COPD exacerbation.  He is open to this idea.  We discussed alternative of Daliresp or doing triple inhaler therapy.  We settled for trying a combination of long-acting beta agonist and anticholinergic such as Stiolto.   CAT Score 07/31/2021 06/16/2021  Total CAT Score 10 13      CT Chest data  No results found.      SYMPTOM SCALE - 10/29/2019  09/03/2020   O2 use ra ra0  Shortness of Breath 0 -> 5 scale with 5 being worst (score 6 If unable to do)   At rest 1 0  Simple tasks - showers, clothes change, eating, shaving 1 1  Household (dishes, doing bed, laundry) 2 1  Shopping 1.5 Does not shop  Walking level at own pace 1 1  Walking up Stairs 2 2  Total (30-36) Dyspnea Score 8.5 (7 without shopping) 5  How bad is your cough? 0 0  How bad is your fatigue 1 00   How bad is nausea 0 0  How bad is vomiting?  0 0  How bad is diarrhea? 0 0  How bad is anxiety? 0 0  How bad is depression 0 0     Simple office walk 185 feet x  3 laps goal with forehead probe 10/29/2019  09/03/2020   O2 used ra ra  Number laps completed 3  order  Comments about pace moderate avg  Resting Pulse Ox/HR 99% and 80/min 99% and 71/min  Final Pulse Ox/HR 99% and 93/min 97% and 98  Desaturated </= 88% no no  Desaturated <= 3% points no no  Got Tachycardic >/= 90/min yes ues  Symptoms at end of test Mild dyspena Very mild dyspneax  Miscellaneous comments x     PFT  PFT Results Latest Ref Rng & Units 07/31/2021 08/04/2020 03/11/2020 09/25/2019  FVC-Pre L 2.99 2.95 3.17 3.15  FVC-Predicted Pre % 63 61 65 65  FVC-Post L 3.10 - - -  FVC-Predicted Post % 65 - - -  Pre FEV1/FVC % % 84 84 83 83  Post FEV1/FCV % % 87 - - -  FEV1-Pre L 2.52 2.48 2.64 2.63  FEV1-Predicted Pre % 72 70 74 73  FEV1-Post L 2.69 - - -  DLCO uncorrected ml/min/mmHg 22.06 - 24.75 21.79  DLCO UNC% % 80 - 89 78  DLCO corrected ml/min/mmHg 22.06 - 24.75 21.32  DLCO COR %Predicted % 80 - 89 76  DLVA Predicted % 126 - 121 112  TLC L 4.84 - - -  TLC % Predicted % 65 - - -  RV % Predicted % 68 - - -       has a past medical history of Arthritis, Bronchitis, Dysrhythmia, GERD (gastroesophageal reflux disease), Hiatal hernia, 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   reports that he quit smoking about 36 years ago. His smoking use included cigarettes. He started smoking about 57 years ago. He has a 36.00 pack-year smoking history. He has never used smokeless tobacco.  Past Surgical History:  Procedure Laterality Date   APPENDECTOMY  1984   CATARACT EXTRACTION     L and R eye   CHOLECYSTECTOMY  2002    HYDROCELE EXCISION Right 11/04/2016   Procedure: HYDROCELECTOMY ADULT;  Surgeon: Franchot Gallo, MD;  Location: [facility];  Service: Urology;  Laterality: Right;   INGUINAL HERNIA REPAIR Bilateral 11/04/2016   Procedure: HERNIA REPAIR INGUINAL ADULT BILATERAL;  Surgeon: Jackolyn Confer, MD;  Location: [facility];  Service: General;  Laterality: Bilateral;   INSERTION OF MESH Bilateral 11/04/2016   Procedure: INSERTION OF MESH;  Surgeon: Jackolyn Confer, MD;  Location: [facility];  Service: General;  Laterality: Bilateral;   JOINT REPLACEMENT     Lt hip   LEFT HEART CATH AND CORONARY ANGIOGRAPHY N/A 10/31/2017   Procedure: LEFT HEART CATH AND CORONARY ANGIOGRAPHY;  Surgeon: Troy Sine, MD;  Location: [facility];  Service: Cardiovascular;  Laterality: N/A;   MASTOIDECTOMY  1996   NASAL SEPTUM SURGERY     sinus surgery   PARATHYROIDECTOMY     TOTAL HIP REVISION  06/14/2011   Procedure: TOTAL HIP REVISION;  Surgeon: Kerin Salen;  Location: [facility];  Service: Orthopedics;  Laterality: Left;  Left Acetabular  Hip Revision    Allergies  Allergen Reactions   Bupropion Other (See Comments)    Caused altered mental status Altered mental status Caused altered mental status   Ciprofloxacin-Dexamethasone Other (See Comments)    Burning in ears when Cipro ear drops were used unknown Burning in ears when Cipro ear drops were used   Gabapentin Other (See Comments)    Caused weakness, dizziness, and forgetfulness  Weakness and dizziness with higher doses. Weakness and dizziness Caused weakness, dizziness, and forgetfulness   Ciprofloxacin Hcl Other (See Comments)    Burning in ears when drops were used   Clonidine Hcl Other (See Comments)    Immunization History  Administered Date(s) Administered   H1N1 04/25/2008   Influenza Split 04/25/2008, 04/08/2009, 04/03/2010, 04/02/2011, 04/18/2012, 04/27/2013, 04/05/2015, 03/30/2019   Influenza, High Dose Seasonal PF 03/30/2016    Influenza,inj,Quad PF,6+ Mos 04/02/2016, 03/03/2017, 04/28/2018, 04/04/2019, 03/20/2020   Influenza,inj,quad, With Preservative 04/15/2015   Influenza-Unspecified 03/26/2021   PFIZER(Purple Top)SARS-COV-2 Vaccination  08/12/2019, 09/05/2019, 04/15/2020, 10/13/2020   Pfizer Covid-19 Vaccine Bivalent Booster 76yrs & up 03/17/2021   Pneumococcal Conjugate-13 05/08/2015   Pneumococcal Polysaccharide-23 10/24/2007, 03/03/2017   Td 03/26/2004   Tdap 03/13/2013   Zoster, Live 03/13/2013, 05/25/2021    Family History  Problem Relation Age of Onset   Lung cancer Father 8       smoked   Heart Problems Father    Cancer Brother        prostate   Heart Problems Mother    Neuropathy Sister    Healthy Son    Healthy Daughter    Healthy Daughter      Current Outpatient Medications:    albuterol (VENTOLIN HFA) 108 (90 Base) MCG/ACT inhaler, Inhale 1-2 puffs into the lungs every 6 (six) hours as needed for wheezing or shortness of breath., Disp: 18 g, Rfl: 2   aspirin 81 MG chewable tablet, Chew 81 mg by mouth daily., Disp: , Rfl:    celecoxib (CELEBREX) 200 MG capsule, 1 capsule with food as needed, Disp: , Rfl:    famotidine (PEPCID) 20 MG tablet, Take 1 tablet (20 mg total) by mouth at bedtime., Disp: 30 tablet, Rfl: 3   fluticasone (FLONASE) 50 MCG/ACT nasal spray, Place 1 spray into both nostrils daily., Disp: 18.2 mL, Rfl: 2   methylphenidate (RITALIN) 10 MG tablet, 1 tablet on empty stomach, Disp: , Rfl:    metoprolol tartrate (LOPRESSOR) 25 MG tablet, Take 25 mg by mouth daily., Disp: , Rfl:    mirtazapine (REMERON) 15 MG tablet, Take 15 mg by mouth at bedtime., Disp: , Rfl:    Multiple Vitamin (MULTIVITAMIN) tablet, Take 1 tablet by mouth daily., Disp: , Rfl:    omeprazole (PRILOSEC) 40 MG capsule, Take 40 mg by mouth every morning., Disp: , Rfl:    polyethylene glycol (MIRALAX / GLYCOLAX) packet, Take 17 g by mouth daily as needed (for constipation.). , Disp: , Rfl:    rosuvastatin  (CRESTOR) 10 MG tablet, TAKE 1 TABLET(10 MG) BY MOUTH DAILY, Disp: 90 tablet, Rfl: 3   SYNTHROID 25 MCG tablet, Take 25 mcg by mouth daily before breakfast. , Disp: , Rfl:    tamsulosin (FLOMAX) 0.4 MG CAPS capsule, Take 0.4 mg by mouth daily after breakfast. , Disp: , Rfl:    Tiotropium Bromide-Olodaterol (STIOLTO RESPIMAT) 2.5-2.5 MCG/ACT AERS, Inhale 2 puffs into the lungs daily., Disp: 4 g, Rfl: 0      Objective:   Vitals:   07/31/21 1332  BP: (!) 110/58  Pulse: 82  Temp: 98.3 F (36.8 C)  TempSrc: Oral  SpO2: 98%  Weight: 181 lb (82.1 kg)  Height: 6' (1.829 m)    Estimated body mass index is 24.55 kg/m as calculated from the following:   Height as of this encounter: 6' (1.829 m).   Weight as of this encounter: 181 lb (82.1 kg).  @WEIGHTCHANGE @  [employer]   07/31/21 1332  Weight: 181 lb (82.1 kg)     Physical Exam General: No distress. Looks well. HOH + Neuro: Alert and Oriented x 3. GCS 15. Speech normal Psych: Pleasant Resp:  Barrel Chest - no.  Wheeze - no, Crackles - no, No overt respiratory distress CVS: Normal heart sounds. Murmurs - no Ext: Stigmata of Connective Tissue Disease - no HEENT: Normal upper airway. PEERL +. No post nasal drip        Assessment:       ICD-10-CM   1. 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 Frequent episodes of bronchitis  J40     3. 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 Centrilobular emphysema ([facility])  J43.2 Alpha-1 antitrypsin phenotype    2. Frequent episodes of bronchitis  J40     3. 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 or sooner if needed    SIGNATURE    Dr. Brand Males, M.D., F.C.C.P,  Pulmonary and Critical Care Medicine Staff Physician, [place] Director - Interstitial Lung Disease  Program  Pulmonary [place] at [place], [place], [address]  Pager: [phone], If no answer or between  15:00h - 7:00h: call [phone] Telephone: [phone]  2:02 PM 07/31/2021

## 2021-07-31 NOTE — Patient Instructions (Addendum)
ICD-10-CM   1. Centrilobular emphysema ([facility])  J43.2 Alpha-1 antitrypsin phenotype    2. Frequent episodes of bronchitis  J40     3. 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 or sooner if needed

## 2021-07-31 NOTE — Progress Notes (Signed)
Full PFT performed today. °

## 2021-08-05 ENCOUNTER — Telehealth: Payer: Self-pay | Admitting: Cardiovascular Disease

## 2021-08-05 NOTE — Telephone Encounter (Signed)
Reference#  [id number]  Which medication is patient supposed to have Metoprolol Tartrate or Metoprolol Succinate. They received a fax with both of them on it.

## 2021-08-05 NOTE — Telephone Encounter (Signed)
Called pharmacy back to inform them that the medication was D/C by provider and added back to pt's medication list by PCP and that they needed to contact PCP for clarification for this medication. Pharmacist verbalized understanding.

## 2021-08-26 ENCOUNTER — Encounter: Payer: Self-pay | Admitting: Cardiovascular Disease

## 2021-08-26 ENCOUNTER — Other Ambulatory Visit: Payer: Medicare Other

## 2021-08-26 DIAGNOSIS — R942 Abnormal results of pulmonary function studies: Secondary | ICD-10-CM

## 2021-08-26 DIAGNOSIS — J40 Bronchitis, not specified as acute or chronic: Secondary | ICD-10-CM

## 2021-08-26 DIAGNOSIS — J432 Centrilobular emphysema: Secondary | ICD-10-CM

## 2021-08-27 NOTE — Telephone Encounter (Signed)
He does not need to use it every day if doing well. Use as needed for congestion or flare up. We can order him a new one if needed

## 2021-08-28 LAB — ACETYLCHOLINE RECEPTOR AB, ALL
AChR Binding Ab, Serum: <0.03 nmol/L (ref 0.00–0.24)
Acetylchol Block Ab: 17 % (ref 0–25)

## 2021-08-28 NOTE — Telephone Encounter (Signed)
Flutter valve ordered. Nothing further needed at this time.

## 2021-08-31 DIAGNOSIS — Z20822 Contact with and (suspected) exposure to covid-19: Secondary | ICD-10-CM | POA: Diagnosis not present

## 2021-09-01 ENCOUNTER — Telehealth: Payer: Self-pay | Admitting: Cardiovascular Disease

## 2021-09-01 NOTE — Telephone Encounter (Signed)
°*  STAT* If patient is at the pharmacy, call can be transferred to refill team.   1. Which medications need to be refilled? (please list name of each medication and dose if known) Metoprolol - said it was denied by Dr Elmarie Shiley office, he is the only one that have refilled this per his pulmonary doctor   2. Which pharmacy/location (including street and city if local pharmacy) is medication to be sent to? [facility], [place]  3. Do they need a 30 day or 90 day supply?

## 2021-09-01 NOTE — Telephone Encounter (Signed)
Left a message for pt to call triage.  Additional information is needed per Dr. Acie Fredrickson.  What is pt currently taking and how are vital signs.

## 2021-09-02 NOTE — Telephone Encounter (Signed)
Lm to call back ./cy 

## 2021-09-03 LAB — ALPHA-1 ANTITRYPSIN PHENOTYPE: A-1 Antitrypsin, Ser: 148 mg/dL (ref 83–199)

## 2021-09-04 DIAGNOSIS — Z20822 Contact with and (suspected) exposure to covid-19: Secondary | ICD-10-CM | POA: Diagnosis not present

## 2021-09-04 NOTE — Telephone Encounter (Signed)
Left message for pt to see if he has been able to get his prescription for Metoprolol Succinate 25 mg daily straightened out.  It appears on 07/31/2021 Raquel Sarna Pinion, CMA (pulmonary) refilled RX for metoprolol tartrate 25 mg once daily.  Pt has only been rxed for metoprolol succinate 25 mg daily from this office and this is what he should be taking unless it has been changed by another MD.  Requested pt either call back to discuss or send a message through [facility].   ?

## 2021-09-07 MED ORDER — METOPROLOL SUCCINATE ER 25 MG PO TB24: 25.0000 mg | ORAL_TABLET | Freq: Every day | ORAL | 3 refills | Status: DC

## 2021-09-07 NOTE — Telephone Encounter (Signed)
Nahser, Wonda Cheng, MD  Donnalee Curry K ?Caller: Unspecified (6 days ago, 11:19 AM) ?Please refill Toprol XL 25 mg a day ( which is what it sounds like he has been taking )  ?Fill for 90 days , 1 year of refills )  ? ?Thanks.  ? ?PN ? ? ?Order placed at this time for Toprol xl, pt aware  ?

## 2021-09-07 NOTE — Telephone Encounter (Signed)
Called and spoke with patient. He states that he has been on metoprolol succinate continuously, with no missed doses and not prescribed from anyone other than our office. He had requested a refill at his pharmacy who states that someone from our office denied the request and told them it was prescribed from his pulmonologist. (I cannot find this documented in his chart) Pt then contacted pulmonologist for the refill and they had given him one fill and told him to follow-up with cardiology for additional. However, that office had prescribed Lopressor '25mg'$  qd, not Toprol XL 25 qd. Pt HAS NOT picked this Rx up from pharmacy due to finding more pills at home. He states that as of 3/6 he has approximately 5 days' worth of medication left on hand, then will need his refill. He has no intention of picking up the Lopressor RX from pulmonary and is requesting that we send in refill for his Toprol XL. Pt states he feels fine and is happy with how the medication is working. Will route clarification to Dr Acie Fredrickson for final approval on refill. ?

## 2021-09-13 DIAGNOSIS — Z20828 Contact with and (suspected) exposure to other viral communicable diseases: Secondary | ICD-10-CM | POA: Diagnosis not present

## 2021-09-14 NOTE — Telephone Encounter (Signed)
DME order never placed, only flutter valve and PCC's do not get these. We have them in clinic now. Spoke with the pt's spouse and advised will leave up front for pick up. I have placed this up front and pt aware he must sign the form for adapt. Nothing further needed. ?

## 2021-09-15 DIAGNOSIS — H6121 Impacted cerumen, right ear: Secondary | ICD-10-CM | POA: Diagnosis not present

## 2021-09-15 DIAGNOSIS — H7293 Unspecified perforation of tympanic membrane, bilateral: Secondary | ICD-10-CM | POA: Diagnosis not present

## 2021-09-15 DIAGNOSIS — H66002 Acute suppurative otitis media without spontaneous rupture of ear drum, left ear: Secondary | ICD-10-CM | POA: Diagnosis not present

## 2021-09-28 ENCOUNTER — Ambulatory Visit: Payer: PRIVATE HEALTH INSURANCE | Admitting: Podiatry

## 2021-10-01 ENCOUNTER — Telehealth (HOSPITAL_COMMUNITY): Payer: Self-pay

## 2021-10-01 DIAGNOSIS — D179 Benign lipomatous neoplasm, unspecified: Secondary | ICD-10-CM | POA: Diagnosis not present

## 2021-10-01 DIAGNOSIS — J449 Chronic obstructive pulmonary disease, unspecified: Secondary | ICD-10-CM | POA: Diagnosis not present

## 2021-10-01 NOTE — Telephone Encounter (Signed)
Called to confirm orientation appointment. Arville Go (wife) confirmed appointment. Instructed on proper footwear and how to find department. Gave department number. ?

## 2021-10-02 DIAGNOSIS — Z20822 Contact with and (suspected) exposure to covid-19: Secondary | ICD-10-CM | POA: Diagnosis not present

## 2021-10-05 ENCOUNTER — Encounter (HOSPITAL_COMMUNITY)
Admission: RE | Admit: 2021-10-05 | Discharge: 2021-10-05 | Disposition: A | Discharge status: Home / Self Care | Payer: Medicare Other | Source: Ambulatory Visit | Attending: Internal Medicine | Admitting: Internal Medicine

## 2021-10-05 ENCOUNTER — Encounter (HOSPITAL_COMMUNITY): Payer: Self-pay

## 2021-10-05 VITALS — BP 120/66 | HR 79 | Ht 71.0 in | Wt 182.8 lb

## 2021-10-05 DIAGNOSIS — D1801 Hemangioma of skin and subcutaneous tissue: Secondary | ICD-10-CM | POA: Diagnosis not present

## 2021-10-05 DIAGNOSIS — J849 Interstitial pulmonary disease, unspecified: Secondary | ICD-10-CM

## 2021-10-05 DIAGNOSIS — L814 Other melanin hyperpigmentation: Secondary | ICD-10-CM | POA: Diagnosis not present

## 2021-10-05 DIAGNOSIS — J432 Centrilobular emphysema: Secondary | ICD-10-CM

## 2021-10-05 DIAGNOSIS — J439 Emphysema, unspecified: Secondary | ICD-10-CM | POA: Diagnosis not present

## 2021-10-05 DIAGNOSIS — L570 Actinic keratosis: Secondary | ICD-10-CM | POA: Diagnosis not present

## 2021-10-05 DIAGNOSIS — L821 Other seborrheic keratosis: Secondary | ICD-10-CM | POA: Diagnosis not present

## 2021-10-05 NOTE — Progress Notes (Signed)
[name] 71 y.o. male ?Pulmonary Rehab Orientation Note ?This patient who was referred to Pulmonary Rehab by [name] with the diagnosis of ILD and Pulmonary emphysema arrived today in Cardiac and Pulmonary Rehab. He arrived with ambulatory normal gait. He does not carry portable oxygen. Per pt, [name] uses oxygen never. Color good, skin warm and dry. Patient is oriented to time and place. Patient's medical history, psychosocial health, and medications reviewed. Psychosocial assessment reveals pt lives with spouse. [name] is currently retired. Pt hobbies include reading research articles. Pt reports his stress level is low. Areas of stress/anxiety include  N/A . Pt does not exhibit signs of depression. PHQ2/9 score 0/1. [name] shows good  coping skills with positive outlook on life. Offered emotional support and reassurance. Will continue to monitor. Physical assessment performed by Maurice Small, RN. Please see their orientation physical assessment note. [name] reports he  does take medications as prescribed. Patient states he  follows a regular  diet. The patient reports no specific efforts to gain or lose weight.. Pt's weight will be monitored closely. Demonstration and practice of PLB using pulse oximeter. [name] able to return demonstration satisfactorily. Safety and hand hygiene in the exercise area reviewed with patient. [name] voices understanding of the information reviewed. Department expectations discussed with patient and achievable goals were set. The patient shows enthusiasm about attending the program and we look forward to working with [name]. [name] completed a 6 min walk test today and is scheduled to begin exercise on 10/13/21 at 10:15am.  ? ?[id number] ?Sheppard Plumber, MS, ACSM-CEP ?  ?

## 2021-10-05 NOTE — Progress Notes (Signed)
Pulmonary Rehab Orientation Physical Assessment Note  Physical assessment reveals  Pt is alert and oriented x 3.  Heart rate is normal, breath sounds clear to auscultation, no wheezes, rales, or rhonchi. Reports non-productive cough. Bowel sounds present.  Pt denies abdominal discomfort, nausea, vomiting or diarrhea. Grip strength equal, strong. Distal pulses palpable; no swelling to lower extremities. Anabela Crayton RN, BSN Cardiac and Pulmonary Rehab Nurse Navigator    

## 2021-10-05 NOTE — Progress Notes (Signed)
Pulmonary Individual Treatment Plan ? ?Patient Details  ?Name: [name] ?MRN: [id number] ?Date of Birth: [birth year] ?Referring Provider:   ?Flowsheet Row Pulmonary Rehab Walk Test from 10/05/2021 in [facility]  ?Referring Provider Ramaswamy  ? ?  ? ? ?Initial Encounter Date:  ?Flowsheet Row Pulmonary Rehab Walk Test from 10/05/2021 in [facility]  ?Date 10/05/21  ? ?  ? ? ?Visit Diagnosis: Interstitial lung disease ([place]) ? ?Centrilobular emphysema ([place]) ? ?Patient's Home Medications on Admission:  ? ?Current Outpatient Medications:  ?  albuterol (VENTOLIN HFA) 108 (90 Base) MCG/ACT inhaler, Inhale 1-2 puffs into the lungs every 6 (six) hours as needed for wheezing or shortness of breath., Disp: 18 g, Rfl: 2 ?  aspirin 81 MG chewable tablet, Chew 81 mg by mouth daily., Disp: , Rfl:  ?  celecoxib (CELEBREX) 200 MG capsule, 1 capsule with food as needed, Disp: , Rfl:  ?  famotidine (PEPCID) 20 MG tablet, Take 1 tablet (20 mg total) by mouth at bedtime., Disp: 30 tablet, Rfl: 3 ?  fluticasone (FLONASE) 50 MCG/ACT nasal spray, Place 1 spray into both nostrils daily., Disp: 18.2 mL, Rfl: 2 ?  methylphenidate (RITALIN) 10 MG tablet, 1 tablet on empty stomach, Disp: , Rfl:  ?  metoprolol succinate (TOPROL XL) 25 MG 24 hr tablet, Take 1 tablet (25 mg total) by mouth daily., Disp: 90 tablet, Rfl: 3 ?  mirtazapine (REMERON) 15 MG tablet, Take 15 mg by mouth at bedtime., Disp: , Rfl:  ?  Multiple Vitamin (MULTIVITAMIN) tablet, Take 1 tablet by mouth daily., Disp: , Rfl:  ?  omeprazole (PRILOSEC) 40 MG capsule, Take 40 mg by mouth every morning., Disp: , Rfl:  ?  polyethylene glycol (MIRALAX / GLYCOLAX) packet, Take 17 g by mouth daily as needed (for constipation.). , Disp: , Rfl:  ?  rosuvastatin (CRESTOR) 10 MG tablet, TAKE 1 TABLET(10 MG) BY MOUTH DAILY, Disp: 90 tablet, Rfl: 3 ?  SYNTHROID 25 MCG tablet, Take 25 mcg by mouth daily before breakfast. , Disp: ,  Rfl:  ?  tamsulosin (FLOMAX) 0.4 MG CAPS capsule, Take 0.4 mg by mouth daily after breakfast. , Disp: , Rfl:  ?  Tiotropium Bromide-Olodaterol (STIOLTO RESPIMAT) 2.5-2.5 MCG/ACT AERS, Inhale 2 puffs into the lungs daily., Disp: 4 g, Rfl: 0 ? ?Past Medical History: ?Past Medical History:  ?Diagnosis Date  ? Arthritis   ? Bronchitis   ? history of  ? Dysrhythmia   ? ":Due to MVP"  ? GERD (gastroesophageal reflux disease)   ? Hiatal hernia   ? 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 History of echocardiogram   ? Echo 11/2019: EF 65-70, no R WMA, mild concentric LVH, normal RV SF, normal PASP (RVSP 19.2 mmHg), trivial MR  ? Hypercalcemia   ? Lipoma   ? left forearm (3)  ? Malignant melanoma of skin ([place]) 06/23/2020  ? Mitral valve prolapse   ? does not see cardiologist for. last stress test 2003  ? Pneumonia 2009  ? Tumor cells, benign   ? lung, left side  ? Unstable angina ([place]) 10/28/2017  ? ? ?Tobacco Use: ?Social History  ? ?Tobacco Use  ?Smoking Status Former  ? Packs/day: 1.50  ? Years: 24.00  ? Pack years: 36.00  ? Types: Cigarettes  ? Start date: 1966  ? Quit date: 07/05/1985  ? Years since quitting: 36.2  ?Smokeless Tobacco Never  ? ? ?Labs: ?Review Flowsheet   ? ?  ?  Latest Ref Rng & Units 10/21/2015 10/28/2017 10/29/2017 02/13/2018  ?Labs for ITP Cardiac and Pulmonary Rehab  ?Cholestrol 100 - 199 mg/dL 189    128   134    ?LDL (calc) 0 - 99 mg/dL 124    76   66    ?HDL-C >39 mg/dL 43    38   41    ?Trlycerides 0 - 149 mg/dL 110    69   137    ?Hemoglobin A1c 4.8 - 5.6 % 5.9   5.4      ?TCO2 22 - 32 mmol/L      ? ?  12/29/2018  ?Labs for ITP Cardiac and Pulmonary Rehab  ?Cholestrol   ?LDL (calc)   ?HDL-C   ?Trlycerides   ?Hemoglobin A1c   ?TCO2 30    ?  ? ? Multiple values from one day are sorted in reverse-chronological order  ?  ?  ? ? ?Capillary Blood Glucose: ?Lab Results  ?Component Value Date  ? GLUCAP 152 (H) 12/29/2018  ? GLUCAP 136 (H) 03/15/2016  ? GLUCAP 102 (H) 09/05/2015  ? GLUCAP 84 09/05/2015  ?  GLUCAP 121 (H) 09/04/2015  ? ? ? ?Pulmonary Assessment Scores: ? Pulmonary Assessment Scores   ? ? [place] Name 10/05/21 6962  ?  ?  ?  ? ADL UCSD  ? ADL Phase Entry    ? SOB Score total 37    ?  ? CAT Score  ? CAT Score 6    ?  ? mMRC Score  ? mMRC Score 3    ? ?  ?  ? ?  ? ?UCSD: ?Self-administered rating of dyspnea associated with activities of daily living (ADLs) ?6-point scale (0 = "not at all" to 5 = "maximal or unable to do because of breathlessness")  ?Scoring Scores range from 0 to 120.  Minimally important difference is 5 units ? ?CAT: ?CAT can identify the health impairment of COPD patients and is better correlated with disease progression.  ?CAT has a scoring range of zero to 40. The CAT score is classified into four groups of low (less than 10), medium (10 - 20), high (21-30) and very high (31-40) based on the impact level of disease on health status. A CAT score over 10 suggests significant symptoms.  A worsening CAT score could be explained by an exacerbation, poor medication adherence, poor inhaler technique, or progression of COPD or comorbid conditions.  ?CAT MCID is 2 points ? ?mMRC: ?mMRC (Modified Medical Research Council) Dyspnea Scale is used to assess the degree of baseline functional disability in patients of respiratory disease due to dyspnea. ?No minimal important difference is established. A decrease in score of 1 point or greater is considered a positive change.  ? ?Pulmonary Function Assessment: ? Pulmonary Function Assessment - 10/05/21 0911   ? ?  ? Breath  ? Bilateral Breath Sounds Clear   ? Shortness of Breath Yes;Limiting activity   ? ?  ?  ? ?  ? ? ?Exercise Target Goals: ?Exercise Program Goal: ?Individual exercise prescription set using results from initial 6 min walk test and THRR while considering  patient?s activity barriers and safety.  ? ?Exercise Prescription Goal: ?Initial exercise prescription builds to 30-45 minutes a day of aerobic activity, 2-3 days per week.  Home  exercise guidelines will be given to patient during program as part of exercise prescription that the participant will acknowledge. ? ?Activity Barriers & Risk Stratification: ? Activity Barriers & Cardiac Risk  Stratification - 10/05/21 0912   ? ?  ? Activity Barriers & Cardiac Risk Stratification  ? Activity Barriers Arthritis;Deconditioning;Left Hip Replacement;Shortness of Breath;Muscular Weakness;History of Falls   ? Cardiac Risk Stratification Moderate   ? ?  ?  ? ?  ? ? ?6 Minute Walk: ? 6 Minute Walk   ? ? [place] Name 10/05/21 6045  ?  ?  ?  ? 6 Minute Walk  ? Phase Initial    ? Distance 1398 feet    ? Walk Time 6 minutes    ? # of Rest Breaks 0    ? MPH 2.65    ? METS 3.16    ? RPE 11    ? Perceived Dyspnea  0.5    ? VO2 Peak 11.06    ? Symptoms No    ? Resting HR 79 bpm    ? Resting BP 120/66    ? Resting Oxygen Saturation  98 %    ? Exercise Oxygen Saturation  during 6 min walk 99 %    ? Max Ex. HR 100 bpm    ? Max Ex. BP 124/64    ? 2 Minute Post BP 112/60    ?  ? Interval HR  ? 1 Minute HR 97    ? 2 Minute HR 100    ? 3 Minute HR 100    ? 4 Minute HR 100    ? 5 Minute HR 100    ? 6 Minute HR 99    ? 2 Minute Post HR 82    ? Interval Heart Rate? Yes    ?  ? Interval Oxygen  ? Interval Oxygen? Yes    ? Baseline Oxygen Saturation % 98 %    ? 1 Minute Oxygen Saturation % 100 %    ? 1 Minute Liters of Oxygen 0 L    ? 2 Minute Oxygen Saturation % 99 %    ? 2 Minute Liters of Oxygen 0 L    ? 3 Minute Oxygen Saturation % 99 %    ? 3 Minute Liters of Oxygen 0 L    ? 4 Minute Oxygen Saturation % 99 %    ? 4 Minute Liters of Oxygen 0 L    ? 5 Minute Oxygen Saturation % 99 %    ? 5 Minute Liters of Oxygen 0 L    ? 6 Minute Oxygen Saturation % 99 %    ? 6 Minute Liters of Oxygen 0 L    ? 2 Minute Post Oxygen Saturation % 100 %    ? 2 Minute Post Liters of Oxygen 0 L    ? ?  ?  ? ?  ? ? ?Oxygen Initial Assessment: ? Oxygen Initial Assessment - 10/05/21 0909   ? ?  ? Home Oxygen  ? Home Oxygen Device None   ? Sleep Oxygen  Prescription None   ? Home Exercise Oxygen Prescription None   ? Home Resting Oxygen Prescription None   ? Compliance with Home Oxygen Use Yes   ?  ? Initial 6 min Walk  ? Oxygen Used None   ?  ? Program [facility]xy[facility]

## 2021-10-06 DIAGNOSIS — Z20822 Contact with and (suspected) exposure to covid-19: Secondary | ICD-10-CM | POA: Diagnosis not present

## 2021-10-08 DIAGNOSIS — Z20822 Contact with and (suspected) exposure to covid-19: Secondary | ICD-10-CM | POA: Diagnosis not present

## 2021-10-13 ENCOUNTER — Encounter (HOSPITAL_COMMUNITY)
Admission: RE | Admit: 2021-10-13 | Discharge: 2021-10-13 | Disposition: A | Discharge status: Home / Self Care | Payer: Medicare Other | Source: Ambulatory Visit | Attending: Internal Medicine | Admitting: Internal Medicine

## 2021-10-13 DIAGNOSIS — J849 Interstitial pulmonary disease, unspecified: Secondary | ICD-10-CM

## 2021-10-13 DIAGNOSIS — J439 Emphysema, unspecified: Secondary | ICD-10-CM | POA: Diagnosis not present

## 2021-10-13 DIAGNOSIS — J432 Centrilobular emphysema: Secondary | ICD-10-CM

## 2021-10-15 ENCOUNTER — Encounter (HOSPITAL_COMMUNITY)
Admission: RE | Admit: 2021-10-15 | Discharge: 2021-10-15 | Disposition: A | Discharge status: Home / Self Care | Payer: Medicare Other | Source: Ambulatory Visit | Attending: Internal Medicine | Admitting: Internal Medicine

## 2021-10-15 DIAGNOSIS — J432 Centrilobular emphysema: Secondary | ICD-10-CM

## 2021-10-15 DIAGNOSIS — J849 Interstitial pulmonary disease, unspecified: Secondary | ICD-10-CM

## 2021-10-15 DIAGNOSIS — J439 Emphysema, unspecified: Secondary | ICD-10-CM | POA: Diagnosis not present

## 2021-10-15 NOTE — Progress Notes (Signed)
Daily Session Note ? ?Patient Details  ?Name: [name] ?MRN: [id number] ?Date of Birth: 06/12/1951 ?Referring Provider:   ?Flowsheet Row Pulmonary Rehab Walk Test from 10/05/2021 in [facility]  ?Referring Provider Ramaswamy  ? ?  ? ? ?Encounter Date: 10/15/2021 ? ?Check In: ? Session Check In - 10/15/21 1124   ? ?  ? Check-In  ? Supervising physician immediately available to respond to emergencies [facility] immediately available   ? Physician(s) Dr. Krishnan   ? Location [facility]   ? Staff Present  , RN, BSN;Jetta Walker BS, ACSM EP-C, Exercise Physiologist;Carlette Carlton, RN, BSN;Kaylee Davis, MS, ACSM-CEP, Exercise Physiologist   ? Virtual Visit No   ? Medication changes reported     No   ? Fall or balance concerns reported    No   ? Tobacco Cessation No Change   ? Warm-up and Cool-down Performed as group-led instruction   ? Resistance Training Performed Yes   ? VAD Patient? No   ? PAD/SET Patient? No   ?  ? Pain Assessment  ? Currently in Pain? No/denies   ? Multiple Pain Sites No   ? ?  ?  ? ?  ? ? ?Capillary Blood Glucose: ?No results found for this or any previous visit (from the past 24 hour(s)). ? ? ? ?Social History  ? ?Tobacco Use  ?Smoking Status Former  ? Packs/day: 1.50  ? Years: 24.00  ? Pack years: 36.00  ? Types: Cigarettes  ? Start date: 1966  ? Quit date: 07/05/1985  ? Years since quitting: 36.3  ?Smokeless Tobacco Never  ? ? ?Goals Met:  ?Independence with exercise equipment ?Exercise tolerated well ?No report of concerns or symptoms today ?Strength training completed today ? ?Goals Unmet:  ?Not Applicable ? ?Comments: Service time is from 1018 to 1130 ? ? ? ?Dr. Jane Ellison is Medical Director for Pulmonary Rehab at [facility].  ?

## 2021-10-19 DIAGNOSIS — Z20822 Contact with and (suspected) exposure to covid-19: Secondary | ICD-10-CM | POA: Diagnosis not present

## 2021-10-20 ENCOUNTER — Encounter (HOSPITAL_COMMUNITY)
Admission: RE | Admit: 2021-10-20 | Discharge: 2021-10-20 | Disposition: A | Discharge status: Home / Self Care | Payer: Medicare Other | Source: Ambulatory Visit | Attending: Internal Medicine | Admitting: Internal Medicine

## 2021-10-20 VITALS — Wt 182.5 lb

## 2021-10-20 DIAGNOSIS — J432 Centrilobular emphysema: Secondary | ICD-10-CM

## 2021-10-20 DIAGNOSIS — J439 Emphysema, unspecified: Secondary | ICD-10-CM | POA: Diagnosis not present

## 2021-10-20 DIAGNOSIS — J849 Interstitial pulmonary disease, unspecified: Secondary | ICD-10-CM

## 2021-10-20 NOTE — Progress Notes (Signed)
Daily Session Note ? ?Patient Details  ?Name: ALROY PORTELA ?MRN: [id number] ?Date of Birth: 1951-02-02 ?Referring Provider:   ?Flowsheet Row Pulmonary Rehab Walk Test from 10/05/2021 in [facility]  ?Referring Provider Ramaswamy  ? ?  ? ? ?Encounter Date: 10/20/2021 ? ?Check In: ? Session Check In - 10/20/21 1128   ? ?  ? Check-In  ? Supervising physician immediately available to respond to emergencies [facility] immediately available   ? Physician(s) Dr. Pietro Cassis   ? Location [facility]   ? Staff Present Rosebud Poles, RN, Luisa Hart, RN, BSN;Sophina Mitten Ysidro Evert, Cathleen Fears, MS, ACSM-CEP, Exercise Physiologist   ? Virtual Visit No   ? Medication changes reported     No   ? Fall or balance concerns reported    No   ? Tobacco Cessation No Change   ? Warm-up and Cool-down Performed as group-led instruction   ? Resistance Training Performed Yes   ? VAD Patient? No   ? PAD/SET Patient? No   ?  ? Pain Assessment  ? Currently in Pain? No/denies   ? Multiple Pain Sites No   ? ?  ?  ? ?  ? ? ?Capillary Blood Glucose: ?No results found for this or any previous visit (from the past 24 hour(s)). ? ? Exercise Prescription Changes - 10/20/21 1200   ? ?  ? Response to Exercise  ? Blood Pressure (Admit) 96/58   ? Blood Pressure (Exercise) 110/70   ? Blood Pressure (Exit) 100/58   ? Heart Rate (Admit) 81 bpm   ? Heart Rate (Exercise) 101 bpm   ? Heart Rate (Exit) 92 bpm   ? Oxygen Saturation (Admit) 98 %   ? Oxygen Saturation (Exercise) 94 %   ? Oxygen Saturation (Exit) 95 %   ? Rating of Perceived Exertion (Exercise) 13   ? Perceived Dyspnea (Exercise) 1   ? Duration Progress to 30 minutes of  aerobic without signs/symptoms of physical distress   ? Intensity Other (comment)   40-80% of HRR  ?  ? Progression  ? Progression Continue to progress workloads to maintain intensity without signs/symptoms of physical distress.   ?  ? Resistance Training  ? Training Prescription Yes   ?  Weight Blue bands   ? Reps 10-15   ? Time 10 Minutes   ?  ? NuStep  ? Level 2   ? SPM 80   ? Minutes 15   ? METs 2.1   ?  ? Arm/Foot Ergometer  ? Level 1.5   ? Watts 21   ? Minutes 15   ? ?  ?  ? ?  ? ? ?Social History  ? ?Tobacco Use  ?Smoking Status Former  ? Packs/day: 1.50  ? Years: 24.00  ? Pack years: 36.00  ? Types: Cigarettes  ? Start date: 1966  ? Quit date: 07/05/1985  ? Years since quitting: 36.3  ?Smokeless Tobacco Never  ? ? ?Goals Met:  ?Proper associated with RPD/PD & O2 Sat ?Exercise tolerated well ?No report of concerns or symptoms today ?Strength training completed today ? ?Goals Unmet:  ?Not Applicable ? ?Comments: Service time is from 1020 to 1145 ? ? ? ?Dr. Rodman Pickle is Medical Director for Pulmonary Rehab at [facility].  ?

## 2021-10-22 ENCOUNTER — Encounter (HOSPITAL_COMMUNITY)
Admission: RE | Admit: 2021-10-22 | Discharge: 2021-10-22 | Disposition: A | Discharge status: Home / Self Care | Payer: Medicare Other | Source: Ambulatory Visit | Attending: Internal Medicine | Admitting: Internal Medicine

## 2021-10-22 DIAGNOSIS — J432 Centrilobular emphysema: Secondary | ICD-10-CM

## 2021-10-22 DIAGNOSIS — J849 Interstitial pulmonary disease, unspecified: Secondary | ICD-10-CM | POA: Diagnosis not present

## 2021-10-22 DIAGNOSIS — J439 Emphysema, unspecified: Secondary | ICD-10-CM | POA: Diagnosis not present

## 2021-10-22 NOTE — Progress Notes (Signed)
Daily Session Note ? ?Patient Details  ?Name: [name] ?MRN: [id number] ?Date of Birth: 05-08-1951 ?Referring Provider:   ?Flowsheet Row Pulmonary Rehab Walk Test from 10/05/2021 in [place]  ?Referring Provider Ramaswamy  ? ?  ? ? ?Encounter Date: 10/22/2021 ? ?Check In: ? Session Check In - 10/22/21 1126   ? ?  ? Check-In  ? Supervising physician immediately available to respond to emergencies [facility] immediately available   ? Physician(s) Dr. Pietro Cassis   ? Location [facility]   ? Staff Present Rosebud Poles, RN, BSN;Carlette Wilber Oliphant, RN, Quentin Ore, MS, ACSM-CEP, Exercise Physiologist;Noorah Giammona Ysidro Evert, RN   ? Virtual Visit No   ? Medication changes reported     No   ? Fall or balance concerns reported    No   ? Tobacco Cessation No Change   ? Warm-up and Cool-down Performed as group-led instruction   ? Resistance Training Performed Yes   ? VAD Patient? No   ? PAD/SET Patient? No   ?  ? Pain Assessment  ? Currently in Pain? No/denies   ? Multiple Pain Sites No   ? ?  ?  ? ?  ? ? ?Capillary Blood Glucose: ?No results found for this or any previous visit (from the past 24 hour(s)). ? ? ? ?Social History  ? ?Tobacco Use  ?Smoking Status Former  ? Packs/day: 1.50  ? Years: 24.00  ? Pack years: 36.00  ? Types: Cigarettes  ? Start date: 1966  ? Quit date: 07/05/1985  ? Years since quitting: 36.3  ?Smokeless Tobacco Never  ? ? ?Goals Met:  ?Proper associated with RPD/PD & O2 Sat ?Exercise tolerated well ?No report of concerns or symptoms today ?Strength training completed today ? ?Goals Unmet:  ?Not Applicable ? ?Comments: Service time is from 1020 to 1135 ? ? ? ?Dr. Rodman Pickle is Medical Director for Pulmonary Rehab at [facility].  ?

## 2021-10-27 ENCOUNTER — Encounter (HOSPITAL_COMMUNITY)
Admission: RE | Admit: 2021-10-27 | Discharge: 2021-10-27 | Disposition: A | Discharge status: Home / Self Care | Payer: Medicare Other | Source: Ambulatory Visit | Attending: Internal Medicine | Admitting: Internal Medicine

## 2021-10-27 DIAGNOSIS — J849 Interstitial pulmonary disease, unspecified: Secondary | ICD-10-CM

## 2021-10-27 DIAGNOSIS — Z20822 Contact with and (suspected) exposure to covid-19: Secondary | ICD-10-CM | POA: Diagnosis not present

## 2021-10-27 DIAGNOSIS — R059 Cough, unspecified: Secondary | ICD-10-CM | POA: Diagnosis not present

## 2021-10-27 DIAGNOSIS — J439 Emphysema, unspecified: Secondary | ICD-10-CM | POA: Diagnosis not present

## 2021-10-27 DIAGNOSIS — J432 Centrilobular emphysema: Secondary | ICD-10-CM

## 2021-10-27 DIAGNOSIS — R051 Acute cough: Secondary | ICD-10-CM | POA: Diagnosis not present

## 2021-10-27 NOTE — Progress Notes (Signed)
Daily Session Note ? ?Patient Details  ?Name: [name] ?MRN: [id number] ?Date of Birth: 1950-09-29 ?Referring Provider:   ?Flowsheet Row Pulmonary Rehab Walk Test from 10/05/2021 in [place]  ?Referring Provider Ramaswamy  ? ?  ? ? ?Encounter Date: 10/27/2021 ? ?Check In: ? Session Check In - 10/27/21 1202   ? ?  ? Check-In  ? Supervising physician immediately available to respond to emergencies [facility] immediately available   ? Physician(s) Dr. Pietro Cassis   ? Location [facility]   ? Staff Present Rosebud Poles, RN, BSN;Carlette Wilber Oliphant, RN, Quentin Ore, MS, ACSM-CEP, Exercise Physiologist;Lisa Ysidro Evert, RN   ? Virtual Visit No   ? Medication changes reported     No   ? Fall or balance concerns reported    No   ? Tobacco Cessation No Change   ? Warm-up and Cool-down Performed as group-led instruction   ? Resistance Training Performed Yes   ? VAD Patient? No   ? PAD/SET Patient? No   ?  ? Pain Assessment  ? Currently in Pain? No/denies   ? Multiple Pain Sites No   ? ?  ?  ? ?  ? ? ?Capillary Blood Glucose: ?No results found for this or any previous visit (from the past 24 hour(s)). ? ? ? ?Social History  ? ?Tobacco Use  ?Smoking Status Former  ? Packs/day: 1.50  ? Years: 24.00  ? Pack years: 36.00  ? Types: Cigarettes  ? Start date: 1966  ? Quit date: 07/05/1985  ? Years since quitting: 36.3  ?Smokeless Tobacco Never  ? ? ?Goals Met:  ?Proper associated with RPD/PD & O2 Sat ?Exercise tolerated well ?No report of concerns or symptoms today ?Strength training completed today ? ?Goals Unmet:  ?Not Applicable ? ?Comments: Service time is from 1025 to 1138. ? ? ? ?Dr. Rodman Pickle is Medical Director for Pulmonary Rehab at [facility].  ?

## 2021-10-28 NOTE — Progress Notes (Signed)
Pulmonary Individual Treatment Plan ? ?Patient Details  ?Name: [name] ?MRN: [id number] ?Date of Birth: [birth year] ?Referring Provider:   ?Flowsheet Row Pulmonary Rehab Walk Test from 10/05/2021 in [place]  ?Referring Provider Ramaswamy  ? ?  ? ? ?Initial Encounter Date:  ?Flowsheet Row Pulmonary Rehab Walk Test from 10/05/2021 in [place]  ?Date 10/05/21  ? ?  ? ? ?Visit Diagnosis: Centrilobular emphysema ([place]) ? ?Interstitial lung disease ([place]) ? ?Patient's Home Medications on Admission:  ? ?Current Outpatient Medications:  ?  albuterol (VENTOLIN HFA) 108 (90 Base) MCG/ACT inhaler, Inhale 1-2 puffs into the lungs every 6 (six) hours as needed for wheezing or shortness of breath., Disp: 18 g, Rfl: 2 ?  aspirin 81 MG chewable tablet, Chew 81 mg by mouth daily., Disp: , Rfl:  ?  celecoxib (CELEBREX) 200 MG capsule, 1 capsule with food as needed, Disp: , Rfl:  ?  famotidine (PEPCID) 20 MG tablet, Take 1 tablet (20 mg total) by mouth at bedtime., Disp: 30 tablet, Rfl: 3 ?  fluticasone (FLONASE) 50 MCG/ACT nasal spray, Place 1 spray into both nostrils daily., Disp: 18.2 mL, Rfl: 2 ?  methylphenidate (RITALIN) 10 MG tablet, 1 tablet on empty stomach, Disp: , Rfl:  ?  metoprolol succinate (TOPROL XL) 25 MG 24 hr tablet, Take 1 tablet (25 mg total) by mouth daily., Disp: 90 tablet, Rfl: 3 ?  mirtazapine (REMERON) 15 MG tablet, Take 15 mg by mouth at bedtime., Disp: , Rfl:  ?  Multiple Vitamin (MULTIVITAMIN) tablet, Take 1 tablet by mouth daily., Disp: , Rfl:  ?  omeprazole (PRILOSEC) 40 MG capsule, Take 40 mg by mouth every morning., Disp: , Rfl:  ?  polyethylene glycol (MIRALAX / GLYCOLAX) packet, Take 17 g by mouth daily as needed (for constipation.). , Disp: , Rfl:  ?  rosuvastatin (CRESTOR) 10 MG tablet, TAKE 1 TABLET(10 MG) BY MOUTH DAILY, Disp: 90 tablet, Rfl: 3 ?  SYNTHROID 25 MCG tablet, Take 25 mcg by mouth daily before breakfast. , Disp: ,  Rfl:  ?  tamsulosin (FLOMAX) 0.4 MG CAPS capsule, Take 0.4 mg by mouth daily after breakfast. , Disp: , Rfl:  ?  Tiotropium Bromide-Olodaterol (STIOLTO RESPIMAT) 2.5-2.5 MCG/ACT AERS, Inhale 2 puffs into the lungs daily., Disp: 4 g, Rfl: 0 ? ?Past Medical History: ?Past Medical History:  ?Diagnosis Date  ? Arthritis   ? Bronchitis   ? history of  ? Dysrhythmia   ? ":Due to MVP"  ? GERD (gastroesophageal reflux disease)   ? Hiatal hernia   ? 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 History of echocardiogram   ? Echo 11/2019: EF 65-70, no R WMA, mild concentric LVH, normal RV SF, normal PASP (RVSP 19.2 mmHg), trivial MR  ? Hypercalcemia   ? Lipoma   ? left forearm (3)  ? Malignant melanoma of skin ([place]) 06/23/2020  ? Mitral valve prolapse   ? does not see cardiologist for. last stress test 2003  ? Pneumonia 2009  ? Tumor cells, benign   ? lung, left side  ? Unstable angina ([place]) 10/28/2017  ? ? ?Tobacco Use: ?Social History  ? ?Tobacco Use  ?Smoking Status Former  ? Packs/day: 1.50  ? Years: 24.00  ? Pack years: 36.00  ? Types: Cigarettes  ? Start date: 1966  ? Quit date: 07/05/1985  ? Years since quitting: 36.3  ?Smokeless Tobacco Never  ? ? ?Labs: ?Review Flowsheet   ? ?  ?  Latest Ref Rng & Units 10/21/2015 10/28/2017 10/29/2017 02/13/2018  ?Labs for ITP Cardiac and Pulmonary Rehab  ?Cholestrol 100 - 199 mg/dL 189    128   134    ?LDL (calc) 0 - 99 mg/dL 124    76   66    ?HDL-C >39 mg/dL 43    38   41    ?Trlycerides 0 - 149 mg/dL 110    69   137    ?Hemoglobin A1c 4.8 - 5.6 % 5.9   5.4      ?TCO2 22 - 32 mmol/L      ? ?  12/29/2018  ?Labs for ITP Cardiac and Pulmonary Rehab  ?Cholestrol   ?LDL (calc)   ?HDL-C   ?Trlycerides   ?Hemoglobin A1c   ?TCO2 30    ?  ? ? Multiple values from one day are sorted in reverse-chronological order  ?  ?  ? ? ?Capillary Blood Glucose: ?Lab Results  ?Component Value Date  ? GLUCAP 152 (H) 12/29/2018  ? GLUCAP 136 (H) 03/15/2016  ? GLUCAP 102 (H) 09/05/2015  ? GLUCAP 84 09/05/2015  ?  GLUCAP 121 (H) 09/04/2015  ? ? ? ?Pulmonary Assessment Scores: ? Pulmonary Assessment Scores   ? ? [place] Name 10/05/21 8338  ?  ?  ?  ? ADL UCSD  ? ADL Phase Entry    ? SOB Score total 37    ?  ? CAT Score  ? CAT Score 6    ?  ? mMRC Score  ? mMRC Score 3    ? ?  ?  ? ?  ? ?UCSD: ?Self-administered rating of dyspnea associated with activities of daily living (ADLs) ?6-point scale (0 = "not at all" to 5 = "maximal or unable to do because of breathlessness")  ?Scoring Scores range from 0 to 120.  Minimally important difference is 5 units ? ?CAT: ?CAT can identify the health impairment of COPD patients and is better correlated with disease progression.  ?CAT has a scoring range of zero to 40. The CAT score is classified into four groups of low (less than 10), medium (10 - 20), high (21-30) and very high (31-40) based on the impact level of disease on health status. A CAT score over 10 suggests significant symptoms.  A worsening CAT score could be explained by an exacerbation, poor medication adherence, poor inhaler technique, or progression of COPD or comorbid conditions.  ?CAT MCID is 2 points ? ?mMRC: ?mMRC (Modified Medical Research Council) Dyspnea Scale is used to assess the degree of baseline functional disability in patients of respiratory disease due to dyspnea. ?No minimal important difference is established. A decrease in score of 1 point or greater is considered a positive change.  ? ?Pulmonary Function Assessment: ? Pulmonary Function Assessment - 10/05/21 0911   ? ?  ? Breath  ? Bilateral Breath Sounds Clear   ? Shortness of Breath Yes;Limiting activity   ? ?  ?  ? ?  ? ? ?Exercise Target Goals: ?Exercise Program Goal: ?Individual exercise prescription set using results from initial 6 min walk test and THRR while considering  patient?s activity barriers and safety.  ? ?Exercise Prescription Goal: ?Initial exercise prescription builds to 30-45 minutes a day of aerobic activity, 2-3 days per week.  Home  exercise guidelines will be given to patient during program as part of exercise prescription that the participant will acknowledge. ? ?Activity Barriers & Risk Stratification: ? Activity Barriers & Cardiac Risk  Stratification - 10/05/21 0912   ? ?  ? Activity Barriers & Cardiac Risk Stratification  ? Activity Barriers Arthritis;Deconditioning;Left Hip Replacement;Shortness of Breath;Muscular Weakness;History of Falls   ? Cardiac Risk Stratification Moderate   ? ?  ?  ? ?  ? ? ?6 Minute Walk: ? 6 Minute Walk   ? ? [place] Name 10/05/21 6384  ?  ?  ?  ? 6 Minute Walk  ? Phase Initial    ? Distance 1398 feet    ? Walk Time 6 minutes    ? # of Rest Breaks 0    ? MPH 2.65    ? METS 3.16    ? RPE 11    ? Perceived Dyspnea  0.5    ? VO2 Peak 11.06    ? Symptoms No    ? Resting HR 79 bpm    ? Resting BP 120/66    ? Resting Oxygen Saturation  98 %    ? Exercise Oxygen Saturation  during 6 min walk 99 %    ? Max Ex. HR 100 bpm    ? Max Ex. BP 124/64    ? 2 Minute Post BP 112/60    ?  ? Interval HR  ? 1 Minute HR 97    ? 2 Minute HR 100    ? 3 Minute HR 100    ? 4 Minute HR 100    ? 5 Minute HR 100    ? 6 Minute HR 99    ? 2 Minute Post HR 82    ? Interval Heart Rate? Yes    ?  ? Interval Oxygen  ? Interval Oxygen? Yes    ? Baseline Oxygen Saturation % 98 %    ? 1 Minute Oxygen Saturation % 100 %    ? 1 Minute Liters of Oxygen 0 L    ? 2 Minute Oxygen Saturation % 99 %    ? 2 Minute Liters of Oxygen 0 L    ? 3 Minute Oxygen Saturation % 99 %    ? 3 Minute Liters of Oxygen 0 L    ? 4 Minute Oxygen Saturation % 99 %    ? 4 Minute Liters of Oxygen 0 L    ? 5 Minute Oxygen Saturation % 99 %    ? 5 Minute Liters of Oxygen 0 L    ? 6 Minute Oxygen Saturation % 99 %    ? 6 Minute Liters of Oxygen 0 L    ? 2 Minute Post Oxygen Saturation % 100 %    ? 2 Minute Post Liters of Oxygen 0 L    ? ?  ?  ? ?  ? ? ?Oxygen Initial Assessment: ? Oxygen Initial Assessment - 10/05/21 0909   ? ?  ? Home Oxygen  ? Home Oxygen Device None   ? Sleep Oxygen  Prescription None   ? Home Exercise Oxygen Prescription None   ? Home Resting Oxygen Prescription None   ? Compliance with Home Oxygen Use Yes   ?  ? Initial 6 min Walk  ? Oxygen Used None   ?  ? Program Oxyge

## 2021-10-29 ENCOUNTER — Encounter (HOSPITAL_COMMUNITY)
Admission: RE | Admit: 2021-10-29 | Discharge: 2021-10-29 | Disposition: A | Discharge status: Home / Self Care | Payer: Medicare Other | Source: Ambulatory Visit | Attending: Internal Medicine | Admitting: Internal Medicine

## 2021-10-29 VITALS — Wt 183.2 lb

## 2021-10-29 DIAGNOSIS — J849 Interstitial pulmonary disease, unspecified: Secondary | ICD-10-CM | POA: Diagnosis not present

## 2021-10-29 DIAGNOSIS — J432 Centrilobular emphysema: Secondary | ICD-10-CM

## 2021-10-29 DIAGNOSIS — J439 Emphysema, unspecified: Secondary | ICD-10-CM | POA: Diagnosis not present

## 2021-10-29 NOTE — Progress Notes (Signed)
Daily Session Note ? ?Patient Details  ?Name: [name] ?MRN: [id number] ?Date of Birth: 1950/08/25 ?Referring Provider:   ?Flowsheet Row Pulmonary Rehab Walk Test from 10/05/2021 in [place]  ?Referring Provider Ramaswamy  ? ?  ? ? ?Encounter Date: 10/29/2021 ? ?Check In: ? Session Check In - 10/29/21 1135   ? ?  ? Check-In  ? Supervising physician immediately available to respond to emergencies [facility] immediately available   ? Physician(s) Dr. Bonner Puna   ? Location [facility]   ? Staff Present Esmeralda Links BS, ACSM EP-C, Exercise Physiologist;Lauralee Waters Rosana Hoes, MS, ACSM-CEP, Exercise Physiologist;Lisa Ysidro Evert, RN   ? Virtual Visit No   ? Medication changes reported     No   ? Fall or balance concerns reported    No   ? Tobacco Cessation No Change   ? Warm-up and Cool-down Performed as group-led instruction   ? Resistance Training Performed Yes   ? VAD Patient? No   ? PAD/SET Patient? No   ?  ? Pain Assessment  ? Currently in Pain? No/denies   ? Multiple Pain Sites No   ? ?  ?  ? ?  ? ? ?Capillary Blood Glucose: ?No results found for this or any previous visit (from the past 24 hour(s)). ? ? ? ?Social History  ? ?Tobacco Use  ?Smoking Status Former  ? Packs/day: 1.50  ? Years: 24.00  ? Pack years: 36.00  ? Types: Cigarettes  ? Start date: 1966  ? Quit date: 07/05/1985  ? Years since quitting: 36.3  ?Smokeless Tobacco Never  ? ? ?Goals Met:  ?Proper associated with RPD/PD & O2 Sat ?Independence with exercise equipment ?Exercise tolerated well ?No report of concerns or symptoms today ?Strength training completed today ? ?Goals Unmet:  ?Not Applicable ? ?Comments: Service time is from 1013 to 1141.  ? ? ?Dr. Rodman Pickle is Medical Director for Pulmonary Rehab at [facility].  ?

## 2021-10-30 DIAGNOSIS — M79621 Pain in right upper arm: Secondary | ICD-10-CM | POA: Diagnosis not present

## 2021-10-30 DIAGNOSIS — D1721 Benign lipomatous neoplasm of skin and subcutaneous tissue of right arm: Secondary | ICD-10-CM | POA: Diagnosis not present

## 2021-11-02 ENCOUNTER — Telehealth: Payer: Self-pay | Admitting: Internal Medicine

## 2021-11-02 NOTE — Telephone Encounter (Signed)
Called and spoke with pt who states he has been coughing x3 days now getting up green phlegm. States that his throat has also been sore which he thinks may be from all the coughing he has been doing.  Pt denies any complaints of wheezing. ? ? ?States that he did a home covid test which came back negative.  Pt has been taking delsym and mucinex which he said has been helping. Is using his Stiolto inhaler as prescribed. Has not used his rescue inhaler. ? ? ?Stated to pt that we should get him in for an appt and he verbalized understanding. Appt scheduled for pt with [facility] 5/2 at 12pm. Nothing further needed. ?

## 2021-11-03 ENCOUNTER — Telehealth (HOSPITAL_COMMUNITY): Payer: Self-pay | Admitting: Family Medicine

## 2021-11-03 ENCOUNTER — Ambulatory Visit (INDEPENDENT_AMBULATORY_CARE_PROVIDER_SITE_OTHER): Payer: Medicare Other | Admitting: Nurse Practitioner

## 2021-11-03 ENCOUNTER — Encounter (HOSPITAL_COMMUNITY): Payer: Medicare Other

## 2021-11-03 ENCOUNTER — Encounter: Payer: Self-pay | Admitting: Nurse Practitioner

## 2021-11-03 ENCOUNTER — Ambulatory Visit (INDEPENDENT_AMBULATORY_CARE_PROVIDER_SITE_OTHER): Payer: Medicare Other

## 2021-11-03 VITALS — BP 124/64 | HR 99 | Temp 98.8°F | Ht 71.0 in | Wt 181.4 lb

## 2021-11-03 DIAGNOSIS — J471 Bronchiectasis with (acute) exacerbation: Secondary | ICD-10-CM | POA: Diagnosis not present

## 2021-11-03 DIAGNOSIS — J441 Chronic obstructive pulmonary disease with (acute) exacerbation: Secondary | ICD-10-CM | POA: Diagnosis not present

## 2021-11-03 DIAGNOSIS — R0609 Other forms of dyspnea: Secondary | ICD-10-CM | POA: Diagnosis not present

## 2021-11-03 DIAGNOSIS — Z87891 Personal history of nicotine dependence: Secondary | ICD-10-CM | POA: Diagnosis not present

## 2021-11-03 DIAGNOSIS — R0602 Shortness of breath: Secondary | ICD-10-CM | POA: Diagnosis not present

## 2021-11-03 DIAGNOSIS — R059 Cough, unspecified: Secondary | ICD-10-CM | POA: Diagnosis not present

## 2021-11-03 MED ORDER — ALBUTEROL SULFATE (2.5 MG/3ML) 0.083% IN NEBU
2.5000 mg | INHALATION_SOLUTION | Freq: Four times a day (QID) | RESPIRATORY_TRACT | 5 refills | Status: DC | PRN
Start: 2021-11-03 — End: 2023-07-29

## 2021-11-03 MED ORDER — DOXYCYCLINE HYCLATE 100 MG PO TABS: 100.0000 mg | ORAL_TABLET | Freq: Two times a day (BID) | ORAL | 0 refills | Status: AC

## 2021-11-03 MED ORDER — PREDNISONE 10 MG PO TABS: ORAL_TABLET | ORAL | 0 refills | Status: DC

## 2021-11-03 NOTE — Patient Instructions (Addendum)
Stop Stiolto. Start Breztri 2 puffs Twice daily. Brush tongue and rinse mouth afterwards  ?Continue Albuterol inhaler 2 puffs every 6 hours as needed for shortness of breath or wheezing. Notify if symptoms persist despite rescue inhaler/neb use. ?Continue flonase nasal spray 1-2 sprays each nostril daily  ? ?Doxycycline 1 tab Twice daily for 7 days. Take with food. Wear sunscreen and avoid direct sun exposure while taking. ?Prednisone taper. 4 tabs for 2 days, then 3 tabs for 2 days, 2 tabs for 2 days, then 1 tab for 2 days, then stop. Take in AM with food  ?Mucinex ER Twice daily for chest congestion ?Flutter valve 2-3 times a day  ? ?Chest x ray today. We will notify you of any abnormal results.  ? ?Follow up in 2 weeks with [name] or Alanson Aly. If symptoms do not improve or worsen, please contact office for sooner follow up or seek emergency care. ?

## 2021-11-03 NOTE — Assessment & Plan Note (Signed)
Frequent flares of chronic bronchitis/COPD. Former heavy smoker; quit >15 years ago so not a candidate for lung cancer screening. Recently developed some anorexia. Given this and his progressive/recurrent respiratory symptoms, CT chest w contrast for further evaluation.  ?

## 2021-11-03 NOTE — Assessment & Plan Note (Signed)
Mild BTX on HRCT 08/2020. Mucociliary clearance advised. If minimal improvement, will need to obtain AFB and sputum cultures.  ?

## 2021-11-03 NOTE — Progress Notes (Signed)
? ?'@Patient'$  ID: [name], male    DOB: Apr 14, 1951, 71 y.o.   MRN: [id number] ? ?Chief Complaint  ?Patient presents with  ? Acute Visit  ?  Pt states for the past 5 days he has been having a cough with green mucus, weakness, no fever noted, negative covid test.   ? ? ?Referring provider: ?Jonathon Jordan, MD ? ?HPI: ?71 year old male, former smoker (36-pack-year history) followed for COPD with chronic bronchitis and emphysema and bronchiectasis.  He is a patient of Dr. Golden Pop and last seen in office 07/31/2021.  Past medical history significant for CAD, mitral valve disorder, cerebrovascular disease, Mobitz 1 second-degree AV block, unstable angina, allergic rhinitis, GERD with Barrett's esophagus, hyperparathyroidism status post parathyroidectomy, dementia, HLD. ? ?TEST/EVENTS:  ?09/25/2019 PFTs: FVC 65, FEV1 73, ratio 83, DLCO corrected for alveolar volume 76% ?03/11/2020 PFTs: FVC 65, FEV1 74, ratio 83, DLCOcor 89 ?08/04/2020 Spirometry: FVC 61, FEV1 70, ratio 84 ?08/15/2020 HRCT chest: Scattered atherosclerosis.  There is no LAD.  There is a primarily fat-containing hiatal hernia present.  There are scattered tiny centrilobular nodules at the lung apices.  Occasional stable, small definitively benign pulmonary nodules in the left lower lobe.  Minimal paraseptal emphysema is noted.  There is unchanged, mild tubular bronchiectasis.  No significant air trapping present. ?07/31/2021 PFTs: FVC 65, FEV1 77, ratio 87, TLC 65, DLCOcor 80.  Mild obstruction and moderate restrictive airway disease with decreased lung volumes.  No significant BD but did have some mid flow reversibility. ? ?07/31/2021: OV with [name].  Experiences frequent episodes of recurrent bronchitis.  Has had 4 exacerbations in the last year, requiring antibiotic and prednisone.  PFTs were overall stable.  Still has a restriction with normal DLCO suggesting neuromuscular defect.  Has had a positive rheumatoid factor in the past but denied  any major arthritis pains.  HRCT from a year ago showed minimal paraseptal emphysema.  Symptom score was stable.  Advised changing inhaler therapy to [employer].  Discussed alternative of including Daliresp to her regimen or doing triple therapy.  Check A1 AT phenotype MM ? ?11/03/2021: Today-acute visit ?Patient presents today for worsening of COPD/bronchitis symptoms.  He reports increased productive cough over the last 5 days with green-yellow sputum production.  He has also had increased shortness of breath upon exertion.  He watches his grandchildren and feels like he has not been able to to do as much with them as he normally can.  Also has some increased generalized weakness.  Has not had any fevers that he is aware of.  He does report some recent decreased appetite.  Does not appear to have lost any weight since I last saw him in November.  He denies hemoptysis, night sweats, lower extremity swelling, URI symptoms.  He continues on Stiolto and occasionally uses his rescue. ? ?Allergies  ?Allergen Reactions  ? Bupropion Other (See Comments)  ?  Caused altered mental status ?Altered mental status ?Caused altered mental status  ? Ciprofloxacin-Dexamethasone Other (See Comments)  ?  Burning in ears when Cipro ear drops were used ?unknown ?Burning in ears when Cipro ear drops were used  ? Gabapentin Other (See Comments)  ?  Caused weakness, dizziness, and forgetfulness ? ?Weakness and dizziness with higher doses. ?Weakness and dizziness ?Caused weakness, dizziness, and forgetfulness  ? Ciprofloxacin Hcl Other (See Comments)  ?  Burning in ears when drops were used  ? Clonidine Hcl Other (See Comments)  ? ? ?Immunization History  ?Administered Date(s) Administered  ?  H1N1 04/25/2008  ? Influenza Split 04/25/2008, 04/08/2009, 04/03/2010, 04/02/2011, 04/18/2012, 04/27/2013, 04/05/2015, 03/30/2019  ? Influenza, High Dose Seasonal PF 03/30/2016  ? Influenza,inj,Quad PF,6+ Mos 04/02/2016, 03/03/2017, 04/28/2018, 04/04/2019,  03/20/2020  ? Influenza,inj,quad, With Preservative 04/15/2015  ? Influenza-Unspecified 03/26/2021  ? PFIZER(Purple Top)SARS-COV-2 Vaccination 08/12/2019, 09/05/2019, 04/15/2020, 10/13/2020  ? Pension scheme manager 79yr & up 03/17/2021  ? Pneumococcal Conjugate-13 05/08/2015  ? Pneumococcal Polysaccharide-23 10/24/2007, 03/03/2017  ? Td 03/26/2004  ? Tdap 03/13/2013  ? Zoster, Live 03/13/2013, 05/25/2021  ? ? ?Past Medical History:  ?Diagnosis Date  ? Arthritis   ? Bronchitis   ? history of  ? Dysrhythmia   ? ":Due to MVP"  ? GERD (gastroesophageal reflux disease)   ? Hiatal hernia   ? 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 History of echocardiogram   ? Echo 11/2019: EF 65-70, no R WMA, mild concentric LVH, normal RV SF, normal PASP (RVSP 19.2 mmHg), trivial MR  ? Hypercalcemia   ? Lipoma   ? left forearm (3)  ? Malignant [place] of skin ([place] 06/23/2020  ? Mitral valve prolapse   ? does not see cardiologist for. last stress test 2003  ? Pneumonia 2009  ? Tumor cells, benign   ? lung, left side  ? [place]stable angina ([place] 10/28/2017  ? ? ?Tobacco History: ?Social History  ? ?Tobacco Use  ?Smoking Status Former  ? Packs/day: 1.50  ? Years: 24.00  ? Pack years: 36.00  ? Types: Cigarettes  ? Start date: 1966  ? Quit date: 07/05/1985  ? Years since quitting: 36.3  ?Smokeless Tobacco Never  ? ?Counseling given: Not Answered ? ? ?Outpatient Medications Prior to Visit  ?Medication Sig Dispense Refill  ? albuterol (VENTOLIN HFA) 108 (90 Base) MCG/ACT inhaler Inhale 1-2 puffs into the lungs every 6 (six) hours as needed for wheezing or shortness of breath. 18 g 2  ? aspirin 81 MG chewable tablet Chew 81 mg by mouth daily.    ? celecoxib (CELEBREX) 200 MG capsule 1 capsule with food as needed    ? famotidine (PEPCID) 20 MG tablet Take 1 tablet (20 mg total) by mouth at bedtime. 30 tablet 3  ? fluticasone (FLONASE) 50 MCG/ACT nasal spray Place 1 spray into both nostrils daily. 18.2 mL 2  ? methylphenidate  (RITALIN) 10 MG tablet 1 tablet on empty stomach    ? metoprolol succinate (TOPROL XL) 25 MG 24 hr tablet Take 1 tablet (25 mg total) by mouth daily. 90 tablet 3  ? mirtazapine (REMERON) 15 MG tablet Take 15 mg by mouth at bedtime.    ? Multiple Vitamin (MULTIVITAMIN) tablet Take 1 tablet by mouth daily.    ? omeprazole (PRILOSEC) 40 MG capsule Take 40 mg by mouth every morning.    ? polyethylene glycol (MIRALAX / GLYCOLAX) packet Take 17 g by mouth daily as needed (for constipation.).     ? rosuvastatin (CRESTOR) 10 MG tablet TAKE 1 TABLET(10 MG) BY MOUTH DAILY 90 tablet 3  ? SYNTHROID 25 MCG tablet Take 25 mcg by mouth daily before breakfast.     ? tamsulosin (FLOMAX) 0.4 MG CAPS capsule Take 0.4 mg by mouth daily after breakfast.     ? Tiotropium Bromide-Olodaterol (STIOLTO RESPIMAT) 2.5-2.5 MCG/ACT AERS Inhale 2 puffs into the lungs daily. 4 g 0  ? ?No facility-administered medications prior to visit.  ? ? ? ?Review of Systems:  ? ?Constitutional: No weight loss or gain, night sweats, fevers, chills. +fatigue,  lassitude. ?HEENT: No headaches, difficulty swallowing, tooth/dental problems, or sore throat. No sneezing, itching, ear ache, nasal congestion, or post nasal drip ?CV:  No chest pain, orthopnea, PND, swelling in lower extremities, anasarca, dizziness, palpitations, syncope ?Resp: +shortness of breath with exertion; productive cough; wheezing.  No hemoptysis.  No chest wall deformity ?GI:  No heartburn, indigestion, abdominal pain, nausea, vomiting, diarrhea, change in bowel habits, loss of appetite, bloody stools.  ?Skin: No rash, lesions, ulcerations ?MSK:  No joint pain or swelling.  No decreased range of motion.  No back pain. ?Neuro: No dizziness or lightheadedness.  ?Psych: No depression or anxiety. Mood stable.  ? ? ? ?Physical Exam: ? ?BP 124/64 (BP Location: Right Arm, Patient Position: Sitting, Cuff Size: Normal)   Pulse 99   Temp 98.8 ?F (37.1 ?C) (Oral)   Ht '5\' 11"'$  (1.803 m)   Wt 181 lb 6.4  oz (82.3 kg)   SpO2 96%   BMI 25.30 kg/m?  ? ?GEN: Pleasant, interactive, well-nourished; elderly; in no acute distress. ?HEENT:  Normocephalic and atraumatic. PERRLA. Sclera white. Nasal turbinates pink,

## 2021-11-03 NOTE — Assessment & Plan Note (Signed)
Frequent exacerbations requiring abx and prednisone. Most recent in February prescribed by PCP. Step up to triple therapy. Advised we could consider addition of daliresp in the future. Will treat with pred taper and doxy course today. CXR to evaluate for superimposed infection. ? ?Patient Instructions  ?Stop Stiolto. Start Breztri 2 puffs Twice daily. Brush tongue and rinse mouth afterwards  ?Continue Albuterol inhaler 2 puffs every 6 hours as needed for shortness of breath or wheezing. Notify if symptoms persist despite rescue inhaler/neb use. ?Continue flonase nasal spray 1-2 sprays each nostril daily  ? ?Doxycycline 1 tab Twice daily for 7 days. Take with food. Wear sunscreen and avoid direct sun exposure while taking. ?Prednisone taper. 4 tabs for 2 days, then 3 tabs for 2 days, 2 tabs for 2 days, then 1 tab for 2 days, then stop. Take in AM with food  ?Mucinex ER Twice daily for chest congestion ?Flutter valve 2-3 times a day  ? ?Chest x ray today. We will notify you of any abnormal results.  ? ?Follow up in 2 weeks with [name] or Alanson Aly. If symptoms do not improve or worsen, please contact office for sooner follow up or seek emergency care. ? ? ?

## 2021-11-03 NOTE — Progress Notes (Signed)
Please notify patient there was no evidence of pneumonia on his CXR today. He does have thickening of his airways, consistent with bronchitis. The step up to the [facility] inhaler will hopefully help reduce inflammation as well as the prednisone course. I have placed the order for CT chest given his recurrent symptoms and progressive DOE. The PCCs will contact him for scheduling this. Thanks!

## 2021-11-05 ENCOUNTER — Telehealth (HOSPITAL_COMMUNITY): Payer: Self-pay | Admitting: Family Medicine

## 2021-11-05 ENCOUNTER — Encounter (HOSPITAL_COMMUNITY): Payer: Medicare Other

## 2021-11-09 ENCOUNTER — Telehealth: Payer: Self-pay | Admitting: Nurse Practitioner

## 2021-11-09 DIAGNOSIS — Z20822 Contact with and (suspected) exposure to covid-19: Secondary | ICD-10-CM | POA: Diagnosis not present

## 2021-11-09 MED ORDER — BENZONATATE 200 MG PO CAPS: 200.0000 mg | ORAL_CAPSULE | Freq: Three times a day (TID) | ORAL | 0 refills | Status: DC | PRN

## 2021-11-09 MED ORDER — PREDNISONE 10 MG PO TABS: ORAL_TABLET | ORAL | 0 refills | Status: DC

## 2021-11-09 NOTE — Telephone Encounter (Signed)
I called and spoke with the pt and notified of response per Joellen Jersey  ?He verbalized understanding  ?Rxs were sent to pharm ?

## 2021-11-09 NOTE — Telephone Encounter (Signed)
Spoke with the pt  ?He is wanting to let Joellen Jersey know that he does not see improvement in his cough and SOB since ov 11/03/21  ?His sputum is white now, but not coughing any less  ?He is still having some SOB and occ wheezing  ?He as 2 days of pred taper  ?Finished Doxy today  ?He is supposed to get CT Chest done tomorrow  ?Pt still taking breztri, albuterol nebs without relief  ?He denies any fevers or aches  ?Declined scheduling sooner appt until he hears from provider  ?Please advise, thanks! ?

## 2021-11-09 NOTE — Telephone Encounter (Signed)
Sounds like his cough has cleared some with doxy course from green/yellow to now white. I think we can hold off on any more abx at this point. He probably has some inflammation to his upper airway leading to persistent coughing. I will send some tessalon perles in - 1 capsule Three times a day as needed for cough. Advise he use 2 tsp Delsym Twice daily. Continue mucinex and flutter valve. Avoid throat clearing. Frequent sips of water with urge to cough. Suck on sugar free hard candies throughout the day. We can extend prednisone taper. Have him take an additional 2 days of 20 mg then 3 days of 10 then stop. Thanks.

## 2021-11-09 NOTE — Telephone Encounter (Signed)
Patient called to give Joellen Jersey an update. Patient states he has 2 days left of prednisone and finishing antibiotic today. Does not feel like he has had any improvements and would like to know what to do. Should he come in earlier than 11/17/2021 or extend medication. ? ?Call back number is [phone]. Please advise. ? ?

## 2021-11-10 ENCOUNTER — Encounter (HOSPITAL_COMMUNITY)
Admission: RE | Admit: 2021-11-10 | Discharge: 2021-11-10 | Disposition: A | Discharge status: Home / Self Care | Payer: Medicare Other | Source: Ambulatory Visit | Attending: Internal Medicine | Admitting: Internal Medicine

## 2021-11-10 ENCOUNTER — Ambulatory Visit (HOSPITAL_COMMUNITY)
Admission: RE | Admit: 2021-11-10 | Discharge: 2021-11-10 | Disposition: A | Discharge status: Home / Self Care | Payer: Medicare Other | Source: Ambulatory Visit | Attending: Nurse Practitioner | Admitting: Nurse Practitioner

## 2021-11-10 DIAGNOSIS — R918 Other nonspecific abnormal finding of lung field: Secondary | ICD-10-CM | POA: Diagnosis not present

## 2021-11-10 DIAGNOSIS — J441 Chronic obstructive pulmonary disease with (acute) exacerbation: Secondary | ICD-10-CM | POA: Insufficient documentation

## 2021-11-10 DIAGNOSIS — J479 Bronchiectasis, uncomplicated: Secondary | ICD-10-CM | POA: Diagnosis not present

## 2021-11-10 DIAGNOSIS — R0609 Other forms of dyspnea: Secondary | ICD-10-CM | POA: Insufficient documentation

## 2021-11-10 DIAGNOSIS — J449 Chronic obstructive pulmonary disease, unspecified: Secondary | ICD-10-CM | POA: Diagnosis not present

## 2021-11-10 DIAGNOSIS — J439 Emphysema, unspecified: Secondary | ICD-10-CM | POA: Diagnosis not present

## 2021-11-10 DIAGNOSIS — Z5189 Encounter for other specified aftercare: Secondary | ICD-10-CM | POA: Insufficient documentation

## 2021-11-10 DIAGNOSIS — J849 Interstitial pulmonary disease, unspecified: Secondary | ICD-10-CM | POA: Insufficient documentation

## 2021-11-10 DIAGNOSIS — J432 Centrilobular emphysema: Secondary | ICD-10-CM | POA: Insufficient documentation

## 2021-11-10 LAB — POCT I-STAT CREATININE: Creatinine, Ser: 1.2 mg/dL (ref 0.61–1.24)

## 2021-11-10 MED ORDER — IOHEXOL 300 MG/ML  SOLN
100.0000 mL | Freq: Once | INTRAMUSCULAR | Status: AC | PRN
  Administered 2021-11-10: 75 mL via INTRAVENOUS

## 2021-11-10 MED ORDER — SODIUM CHLORIDE (PF) 0.9 % IJ SOLN
INTRAMUSCULAR | Status: AC
  Filled 2021-11-10: qty 50

## 2021-11-10 NOTE — Progress Notes (Signed)
Daily Session Note ? ?Patient Details  ?Name: [name] ?MRN: [id number] ?Date of Birth: 1951/06/02 ?Referring Provider:   ?Flowsheet Row Pulmonary Rehab Walk Test from 10/05/2021 in [place]  ?Referring Provider Ramaswamy  ? ?  ? ? ?Encounter Date: 11/10/2021 ? ?Check In: ? Session Check In - 11/10/21 1127   ? ?  ? Check-In  ? Supervising physician immediately available to respond to emergencies [facility] immediately available   ? Physician(s) Dr. Karleen Hampshire   ? Location [facility]   ? Staff Present Rosebud Poles, RN, BSN;Carlette Wilber Oliphant, RN, Quentin Ore, MS, ACSM-CEP, Exercise Physiologist   ? Virtual Visit No   ? Medication changes reported     No   ? Fall or balance concerns reported    No   ? Tobacco Cessation No Change   ? Warm-up and Cool-down Performed as group-led instruction   ? Resistance Training Performed Yes   ? VAD Patient? No   ? PAD/SET Patient? No   ?  ? Pain Assessment  ? Currently in Pain? No/denies   ? Multiple Pain Sites No   ? ?  ?  ? ?  ? ? ?Capillary Blood Glucose: ?No results found for this or any previous visit (from the past 24 hour(s)). ? ? ? ?Social History  ? ?Tobacco Use  ?Smoking Status Former  ? Packs/day: 1.50  ? Years: 24.00  ? Pack years: 36.00  ? Types: Cigarettes  ? Start date: 1966  ? Quit date: 07/05/1985  ? Years since quitting: 36.3  ?Smokeless Tobacco Never  ? ? ?Goals Met:  ?Proper associated with RPD/PD & O2 Sat ?Exercise tolerated well ?No report of concerns or symptoms today ?Strength training completed today ? ?Goals Unmet:  ?Not Applicable ? ?Comments: Service time is from 1015 ? to 1129 ? ? ? ?Dr. Rodman Pickle is Medical Director for Pulmonary Rehab at [facility].  ?

## 2021-11-10 NOTE — Progress Notes (Signed)
Home Exercise Prescription ?I have reviewed a Home Exercise Prescription with [name]. [name] is exercising at home inconsistently. He walks 1 non-rehab day/wk for 45-50 min/day. He stated that his plan is to walk 4 days/wk. I agreed with this plan. I encouraged him to walk for 30-45 min/day. He agreed with my recommendations. [name] is motivated to exercise and improve his functional capacity. He understands that he needs to get back to exercising around home. He wants to use his [employer] program to join a local fitness center. I encouraged him to do so and offered exercise class recommendations. The patient stated that their goals were to join a [employer] fitness center. We reviewed exercise guidelines, target heart rate during exercise, RPE Scale, weather conditions, endpoints for exercise, warmup and cool down. The patient is encouraged to come to me with any questions. I will continue to follow up with the patient to assist them with progression and safety.   ? ?Sheppard Plumber, MS, ACSM-CEP ?11/10/2021 ?3:35 PM  ?

## 2021-11-11 ENCOUNTER — Telehealth: Payer: Self-pay | Admitting: Nurse Practitioner

## 2021-11-11 NOTE — Telephone Encounter (Signed)
Please advise on CT results. I have let the patient know that it could be a couple more days or so and she voices understanding.  ?

## 2021-11-12 ENCOUNTER — Encounter (HOSPITAL_COMMUNITY)
Admission: RE | Admit: 2021-11-12 | Discharge: 2021-11-12 | Disposition: A | Discharge status: Home / Self Care | Payer: Medicare Other | Source: Ambulatory Visit | Attending: Internal Medicine | Admitting: Internal Medicine

## 2021-11-12 DIAGNOSIS — Z5189 Encounter for other specified aftercare: Secondary | ICD-10-CM | POA: Diagnosis not present

## 2021-11-12 DIAGNOSIS — J432 Centrilobular emphysema: Secondary | ICD-10-CM | POA: Diagnosis not present

## 2021-11-12 DIAGNOSIS — J849 Interstitial pulmonary disease, unspecified: Secondary | ICD-10-CM | POA: Diagnosis not present

## 2021-11-12 NOTE — Telephone Encounter (Signed)
CT was read today - received results this morning. It showed that his previous identified pulmonary nodules were stable. He has some very small scattered nodules, likely related to bronchiolitis. He has emphysematous changes, consistent with known COPD, and mild bronchiectasis which is stable compared to previous imaging. There are no findings concerning for infection or malignancy. He does have evidence of coronary artery disease so I would recommend follow up with his PCP or cardiologist to discuss treatment options. Thanks.

## 2021-11-12 NOTE — Progress Notes (Signed)
Daily Session Note ? ?Patient Details  ?Name: [name] ?MRN: [id number] ?Date of Birth: [birth year] ?Referring Provider:   ?Flowsheet Row Pulmonary Rehab Walk Test from 10/05/2021 in [place]  ?Referring Provider Ramaswamy  ? ?  ? ? ?Encounter Date: 11/12/2021 ? ?Check In: ? Session Check In - 11/12/21 1125   ? ?  ? Check-In  ? Supervising physician immediately available to respond to emergencies [facility] immediately available   ? Physician(s) Dr. Sloan Leiter   ? Location [facility]   ? Staff Present Maurice Small, RN, Quentin Ore, MS, ACSM-CEP, Exercise Physiologist;Annedrea Rosezella [place], RN, [place]   ? Virtual Visit No   ? Medication changes reported     No   ? Fall or balance concerns reported    No   ? Tobacco Cessation No Change   ? Warm-up and Cool-down Performed as group-led instruction   ? Resistance Training Performed Yes   ? VAD Patient? No   ? PAD/SET Patient? No   ?  ? Pain Assessment  ? Currently in Pain? No/denies   ? ?  ?  ? ?  ? ? ?Capillary Blood Glucose: ?No results found for this or any previous visit (from the past 24 hour(s)). ? ? ? ?Social History  ? ?Tobacco Use  ?Smoking Status Former  ? Packs/day: 1.50  ? Years: 24.00  ? Pack years: 36.00  ? Types: Cigarettes  ? Start date: 1966  ? Quit date: 07/05/1985  ? Years since quitting: 36.3  ?Smokeless Tobacco Never  ? ? ?Goals Met:  ?Proper associated with RPD/PD & O2 Sat ?Independence with exercise equipment ?Exercise tolerated well ?No report of concerns or symptoms today ?Strength training completed today ? ?Goals Unmet:  ?Not Applicable ? ?Comments: Service time is from 1015 to 71.  ? ? ?Dr. Rodman Pickle is Medical Director for Pulmonary Rehab at [facility].  ?

## 2021-11-12 NOTE — Telephone Encounter (Signed)
Called patient but he did not answer. Left message for him to call back.  

## 2021-11-16 ENCOUNTER — Ambulatory Visit: Payer: PRIVATE HEALTH INSURANCE | Admitting: Orthopaedic Surgery

## 2021-11-16 DIAGNOSIS — F900 Attention-deficit hyperactivity disorder, predominantly inattentive type: Secondary | ICD-10-CM | POA: Diagnosis not present

## 2021-11-16 DIAGNOSIS — J432 Centrilobular emphysema: Secondary | ICD-10-CM | POA: Diagnosis not present

## 2021-11-17 ENCOUNTER — Encounter (HOSPITAL_COMMUNITY): Payer: Medicare Other

## 2021-11-17 ENCOUNTER — Ambulatory Visit (INDEPENDENT_AMBULATORY_CARE_PROVIDER_SITE_OTHER): Payer: Medicare Other | Admitting: Nurse Practitioner

## 2021-11-17 ENCOUNTER — Telehealth: Payer: Self-pay | Admitting: Nurse Practitioner

## 2021-11-17 ENCOUNTER — Encounter: Payer: Self-pay | Admitting: Nurse Practitioner

## 2021-11-17 VITALS — BP 90/52 | HR 57 | Temp 98.5°F | Ht 71.0 in | Wt 180.2 lb

## 2021-11-17 DIAGNOSIS — J479 Bronchiectasis, uncomplicated: Secondary | ICD-10-CM

## 2021-11-17 DIAGNOSIS — R0609 Other forms of dyspnea: Secondary | ICD-10-CM | POA: Diagnosis not present

## 2021-11-17 DIAGNOSIS — R42 Dizziness and giddiness: Secondary | ICD-10-CM

## 2021-11-17 DIAGNOSIS — J45901 Unspecified asthma with (acute) exacerbation: Secondary | ICD-10-CM | POA: Diagnosis not present

## 2021-11-17 DIAGNOSIS — R600 Localized edema: Secondary | ICD-10-CM | POA: Diagnosis not present

## 2021-11-17 DIAGNOSIS — J441 Chronic obstructive pulmonary disease with (acute) exacerbation: Secondary | ICD-10-CM | POA: Diagnosis not present

## 2021-11-17 LAB — BASIC METABOLIC PANEL
BUN: 22 mg/dL (ref 6–23)
CO2: 28 mEq/L (ref 19–32)
Calcium: 8.9 mg/dL (ref 8.4–10.5)
Chloride: 103 mEq/L (ref 96–112)
Creatinine, Ser: 1.15 mg/dL (ref 0.40–1.50)
GFR: 64 mL/min (ref >60.00)
Glucose, Bld: 92 mg/dL (ref 70–99)
Potassium: 4.3 mEq/L (ref 3.5–5.1)
Sodium: 139 mEq/L (ref 135–145)

## 2021-11-17 LAB — CBC WITH DIFFERENTIAL/PLATELET
Basophils Absolute: 0 10*3/uL (ref 0.0–0.1)
Basophils Relative: 0.2 % (ref 0.0–3.0)
Eosinophils Absolute: 0.2 10*3/uL (ref 0.0–0.7)
Eosinophils Relative: 1.4 % (ref 0.0–5.0)
HCT: 44.8 % (ref 39.0–52.0)
Hemoglobin: 15.1 g/dL (ref 13.0–17.0)
Lymphocytes Relative: 16.9 % (ref 12.0–46.0)
Lymphs Abs: 1.9 10*3/uL (ref 0.7–4.0)
MCHC: 33.7 g/dL (ref 30.0–36.0)
MCV: 95.7 fl (ref 78.0–100.0)
Monocytes Absolute: 0.7 10*3/uL (ref 0.1–1.0)
Monocytes Relative: 6.4 % (ref 3.0–12.0)
Neutro Abs: 8.5 10*3/uL — ABNORMAL HIGH (ref 1.4–7.7)
Neutrophils Relative %: 75.1 % (ref 43.0–77.0)
Platelets: 161 10*3/uL (ref 150.0–400.0)
RBC: 4.68 Mil/uL (ref 4.22–5.81)
RDW: 12.8 % (ref 11.5–15.5)
WBC: 11.3 10*3/uL — ABNORMAL HIGH (ref 4.0–10.5)

## 2021-11-17 LAB — POCT EXHALED NITRIC OXIDE: FeNO level (ppb): 43

## 2021-11-17 LAB — BRAIN NATRIURETIC PEPTIDE: Pro B Natriuretic peptide (BNP): 41 pg/mL (ref 0.0–100.0)

## 2021-11-17 MED ORDER — BREZTRI AEROSPHERE 160-9-4.8 MCG/ACT IN AERO: 2.0000 | INHALATION_SPRAY | Freq: Two times a day (BID) | RESPIRATORY_TRACT | 0 refills | Status: DC

## 2021-11-17 MED ORDER — PREDNISONE 10 MG PO TABS: ORAL_TABLET | ORAL | 0 refills | Status: DC

## 2021-11-17 MED ORDER — MONTELUKAST SODIUM 10 MG PO TABS: 10.0000 mg | ORAL_TABLET | Freq: Every day | ORAL | 5 refills | Status: DC

## 2021-11-17 MED ORDER — METHYLPREDNISOLONE ACETATE 80 MG/ML IJ SUSP
80.0000 mg | Freq: Once | INTRAMUSCULAR | Status: AC
  Administered 2021-11-17: 80 mg via INTRAMUSCULAR

## 2021-11-17 NOTE — Assessment & Plan Note (Signed)
Intermittent, mainly in evenings. Appears euvolemic on exam. Advised TED hose. Check BNP today. Needs further evaluation with cardiology.  ?

## 2021-11-17 NOTE — Addendum Note (Signed)
Addended by: Fran Lowes on: 11/17/2021 02:51 PM ? ? Modules accepted: Orders ? ?

## 2021-11-17 NOTE — Progress Notes (Signed)
? ?'@Patient'$  ID: [name], male    DOB: 01-Apr-1951, 71 y.o.   MRN: [id number] ? ?Chief Complaint  ?Patient presents with  ? Follow-up  ?  Follow up. Patient still has cough. Patient says cough has gotten a little worse.   ? ? ?Referring provider: ?Jonathon Jordan, MD ? ?HPI: ?71 year old male, former smoker (36-pack-year history) followed for COPD with chronic bronchitis and emphysema and bronchiectasis.  He is a patient of Dr. Golden Pop and last seen in office 07/31/2021.  Past medical history significant for CAD, mitral valve disorder, cerebrovascular disease, Mobitz 1 second-degree AV block, unstable angina, allergic rhinitis, GERD with Barrett's esophagus, hyperparathyroidism status post parathyroidectomy, dementia, HLD. ? ?TEST/EVENTS:  ?09/25/2019 PFTs: FVC 65, FEV1 73, ratio 83, DLCO corrected for alveolar volume 76% ?03/11/2020 PFTs: FVC 65, FEV1 74, ratio 83, DLCOcor 89 ?08/04/2020 Spirometry: FVC 61, FEV1 70, ratio 84 ?08/15/2020 HRCT chest: Scattered atherosclerosis.  There is no LAD.  There is a primarily fat-containing hiatal hernia present.  There are scattered tiny centrilobular nodules at the lung apices.  Occasional stable, small definitively benign pulmonary nodules in the left lower lobe.  Minimal paraseptal emphysema is noted.  There is unchanged, mild tubular bronchiectasis.  No significant air trapping present. ?07/31/2021 PFTs: FVC 65, FEV1 77, ratio 87, TLC 65, DLCOcor 80.  Mild obstruction and moderate restrictive airway disease with decreased lung volumes.  No significant BD but did have some mid flow reversibility. ?11/10/2021 CT chest with contrast: Atherosclerosis and three-vessel CAD present.  No LAD present.  There is a fat-containing hiatal hernia.  Minimal paraseptal emphysema is present.  There is unchanged mild, tubular bronchiectasis throughout.  Background of very tiny centrilobular pulmonary nodules, most concentrated at the lung apices, most commonly seen in smoking-related  bronchiolitis.  More discrete ? ?07/31/2021: OV with [name].  Experiences frequent episodes of recurrent bronchitis.  Has had 4 exacerbations in the last year, requiring antibiotic and prednisone.  PFTs were overall stable.  Still has a restriction with normal DLCO suggesting neuromuscular defect.  Has had a positive rheumatoid factor in the past but denied any major arthritis pains.  HRCT from a year ago showed minimal paraseptal emphysema.  Symptom score was stable.  Advised changing inhaler therapy to [employer].  Discussed alternative of including Daliresp to her regimen or doing triple therapy.  Check A1 AT phenotype MM ? ?11/03/2021: OV with Rashmi Tallent NP for worsening of COPD/bronchitis symptoms.  He reports increased productive cough over the last 5 days with green-yellow sputum production.  He has also had increased shortness of breath upon exertion.  He watches his grandchildren and feels like he has not been able to to do as much with them as he normally can.  Also has some increased generalized weakness.  Has not had any fevers that he is aware of.  He does report some recent decreased appetite.  Does not appear to have lost any weight since I last saw him in November.  Step up to [employer]. Treated with prednisone taper and doxy course. CT chest w contrast to r/o malignancy or other underlying pulm etiology. Close follow up ? ?11/17/2021: Today - follow up ?Patient presents today for follow up. He continues to have a productive, congested cough. Felt like the prednisone provided minimal relief this time. Has been having difficulties with this cough for sometime now. CT chest did not show any worsening to his emphysema or BTX and no concern for malignancy. His shortness of breath is overall stable.  Doesn't notice much wheezing and has never really had problems with allergies. He's on omeprazole and pepcid for GERD and feels as though this is well-controlled. He continues on [place], does feel like this helped  some with his breathing. He continues on flonase nasal spray, is using mucinex Twice daily and flutter valve 2-3 times a day.  ? ?Over the past week, he's felt weaker and has been getting dizzy when he stands. Denies any syncopal events or palpitations. Does occasional have lower extremity swelling, which is a chronic problem for him. He does take metoprolol and his blood pressure was noted to be lower than normal today.  ? ?FeNO 43 ppb ? ?Allergies  ?Allergen Reactions  ? Bupropion Other (See Comments)  ?  Caused altered mental status ?Altered mental status ?Caused altered mental status  ? Ciprofloxacin-Dexamethasone Other (See Comments)  ?  Burning in ears when Cipro ear drops were used ?unknown ?Burning in ears when Cipro ear drops were used  ? Gabapentin Other (See Comments)  ?  Caused weakness, dizziness, and forgetfulness ? ?Weakness and dizziness with higher doses. ?Weakness and dizziness ?Caused weakness, dizziness, and forgetfulness  ? Ciprofloxacin Hcl Other (See Comments)  ?  Burning in ears when drops were used  ? Clonidine Hcl Other (See Comments)  ? ? ?Immunization History  ?Administered Date(s) Administered  ? H1N1 04/25/2008  ? Influenza Split 04/25/2008, 04/08/2009, 04/03/2010, 04/02/2011, 04/18/2012, 04/27/2013, 04/05/2015, 03/30/2019  ? Influenza, High Dose Seasonal PF 03/30/2016  ? Influenza,inj,Quad PF,6+ Mos 04/02/2016, 03/03/2017, 04/28/2018, 04/04/2019, 03/20/2020  ? Influenza,inj,quad, With Preservative 04/15/2015  ? Influenza-Unspecified 03/26/2021  ? PFIZER(Purple Top)SARS-COV-2 Vaccination 08/12/2019, 09/05/2019, 04/15/2020, 10/13/2020  ? Pension scheme manager 56yr & up 03/17/2021  ? Pneumococcal Conjugate-13 05/08/2015  ? Pneumococcal Polysaccharide-23 10/24/2007, 03/03/2017  ? Td 03/26/2004  ? Tdap 03/13/2013  ? Zoster, Live 03/13/2013, 05/25/2021  ? ? ?Past Medical History:  ?Diagnosis Date  ? Arthritis   ? Bronchitis   ? history of  ? Dysrhythmia   ? ":Due to MVP"   ? GERD (gastroesophageal reflux disease)   ? Hiatal hernia   ? 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 History of echocardiogram   ? Echo 11/2019: EF 65-70, no R WMA, mild concentric LVH, normal RV SF, normal PASP (RVSP 19.2 mmHg), trivial MR  ? Hypercalcemia   ? Lipoma   ? left forearm (3)  ? Malignant melanoma of skin ([facility] 06/23/2020  ? Mitral valve prolapse   ? does not see cardiologist for. last stress test 2003  ? Pneumonia 2009  ? Tumor cells, benign   ? lung, left side  ? Unstable angina ([facility] 10/28/2017  ? ? ?Tobacco History: ?Social History  ? ?Tobacco Use  ?Smoking Status Former  ? Packs/day: 1.50  ? Years: 24.00  ? Pack years: 36.00  ? Types: Cigarettes  ? Start date: 1966  ? Quit date: 07/05/1985  ? Years since quitting: 36.3  ?Smokeless Tobacco Never  ? ?Counseling given: Not Answered ? ? ?Outpatient Medications Prior to Visit  ?Medication Sig Dispense Refill  ? albuterol (PROVENTIL) (2.5 MG/3ML) 0.083% nebulizer solution Take 3 mLs (2.5 mg total) by nebulization every 6 (six) hours as needed for wheezing or shortness of breath. 75 mL 5  ? albuterol (VENTOLIN HFA) 108 (90 Base) MCG/ACT inhaler Inhale 1-2 puffs into the lungs every 6 (six) hours as needed for wheezing or shortness of breath. 18 g 2  ? aspirin 81 MG chewable tablet Chew 81  mg by mouth daily.    ? benzonatate (TESSALON) 200 MG capsule Take 1 capsule (200 mg total) by mouth 3 (three) times daily as needed for cough. 40 capsule 0  ? celecoxib (CELEBREX) 200 MG capsule 1 capsule with food as needed    ? famotidine (PEPCID) 20 MG tablet Take 1 tablet (20 mg total) by mouth at bedtime. 30 tablet 3  ? fluticasone (FLONASE) 50 MCG/ACT nasal spray Place 1 spray into both nostrils daily. 18.2 mL 2  ? methylphenidate (RITALIN) 10 MG tablet 1 tablet on empty stomach    ? metoprolol succinate (TOPROL XL) 25 MG 24 hr tablet Take 1 tablet (25 mg total) by mouth daily. 90 tablet 3  ? mirtazapine (REMERON) 15 MG tablet Take 15 mg by mouth at bedtime.     ? Multiple Vitamin (MULTIVITAMIN) tablet Take 1 tablet by mouth daily.    ? omeprazole (PRILOSEC) 40 MG capsule Take 40 mg by mouth every morning.    ? polyethylene glycol (MIRALAX / GLYCOLAX) packet Take

## 2021-11-17 NOTE — Assessment & Plan Note (Signed)
Mild on recent CT chest. Continue mucociliary clearance therapies. Given elevated FeNO, suspect cough is primarily r/t bronchitis.  ?

## 2021-11-17 NOTE — Assessment & Plan Note (Signed)
Orthostatic BP checked today and no hypotension noted. BP is lower than normal at 90/54. Rechecked and maintained SBP 90's. Check CBC, BMET and BNP to evaluate for other underlying etiology. Suspect this is unrelated to his COPD/asthma exacerbation. Concern he needs metoprolol adjusted or echo for further eval. Advised he contact cardiology for med adjustment and further eval. ED precautions advised.  ?

## 2021-11-17 NOTE — Assessment & Plan Note (Signed)
Persistent bronchitic type cough. Given past hx of elevated FeNO and eosinophils, rechecked today and noted to be elevated at 43 ppb. Stepped up to triple therapy at last visit. Treat with depo 80 mg inj x 1 and pred taper today. Completed doxy course last time and CT chest without evidence of infection so we can hold off on any further abx at this time. Advised cough control measures. Suspect there is an allergen component, although he doesn't have any other obvious allergy symptoms - start Singulair and Zyrtec for trigger prevention.  ? ?Patient Instructions  ?Continue Breztri 2 puffs Twice daily. Brush tongue and rinse mouth afterwards  ?Continue Albuterol inhaler 2 puffs or 3 mL neb every 6 hours as needed for shortness of breath or wheezing. Notify if symptoms persist despite rescue inhaler/neb use. Use neb then flutter valve Twice daily  ?Continue flonase nasal spray 1-2 sprays each nostril daily  ?Continue Mucinex ER Twice daily for chest congestion ?Continue Flutter valve 2-3 times a day  ?Continue pepcid 20 mg daily at bedtime ?Continue omeprazole 40 mg daily for reflux  ? ?Singulair 10 mg At bedtime  ?Zyrtec 10 mg daily  ?Chlorpheniramine tab 4 mg at night over the counter for cough ?Delsym 2 tsp Twice daily for cough ?Tessalon perles 1 capsule Three times a day for cough ?Prednisone taper. 4 tabs for 3 days, then 3 tabs for 3 days, 2 tabs for 3 days, then 1 tab for 3 days, then stop. Start tomorrow. Take in AM with food.  ? ?Labs today - BMET, BNP, CBC with diff ? ?Call cardiology to notify them of your lower blood pressure and dizziness. May need to adjust your BP meds.  ?  ?Follow up in 2 weeks with [name] or Alanson Aly. If symptoms do not improve or worsen, please contact office for sooner follow up or seek emergency care ? ? ?

## 2021-11-17 NOTE — Progress Notes (Signed)
Please notify patient BMET and BNP normal.  CBC did show a slight elevation in WBCs and neutrophils, likely related to recent prednisone use.  He also had an elevation in his eosinophils, which we started him on Singulair for today.  Advised plan as previously discussed during visit.

## 2021-11-17 NOTE — Progress Notes (Deleted)
@Patient  ID: [name], male    DOB: September 01, 1950, 71 y.o.   MRN: [id number]  No chief complaint on file.   Referring provider: [name], MD  HPI: 71 year old male, former smoker (36-pack-year history) followed for COPD with chronic bronchitis and emphysema and bronchiectasis.  He is a patient Dr. [name] and last seen in office on 11/03/2021 by [facility] NP.  Past medical history significant for CAD, mitral valve disorder, cerebrovascular disease, Mobitz 1 second-degree AV block, unstable angina, allergic rhinitis, GERD with Barrett's esophagus, hyperparathyroidism status post parathyroidectomy, dementia, HLD.  TEST/EVENTS:   Allergies  Allergen Reactions   Bupropion Other (See Comments)    Caused altered mental status Altered mental status Caused altered mental status   Ciprofloxacin-Dexamethasone Other (See Comments)    Burning in ears when Cipro ear drops were used unknown Burning in ears when Cipro ear drops were used   Gabapentin Other (See Comments)    Caused weakness, dizziness, and forgetfulness  Weakness and dizziness with higher doses. Weakness and dizziness Caused weakness, dizziness, and forgetfulness   Ciprofloxacin Hcl Other (See Comments)    Burning in ears when drops were used   Clonidine Hcl Other (See Comments)    Immunization History  Administered Date(s) Administered   H1N1 04/25/2008   Influenza Split 04/25/2008, 04/08/2009, 04/03/2010, 04/02/2011, 04/18/2012, 04/27/2013, 04/05/2015, 03/30/2019   Influenza, High Dose Seasonal PF 03/30/2016   Influenza,inj,Quad PF,6+ Mos 04/02/2016, 03/03/2017, 04/28/2018, 04/04/2019, 03/20/2020   Influenza,inj,quad, With Preservative 04/15/2015   Influenza-Unspecified 03/26/2021   PFIZER(Purple Top)SARS-COV-2 Vaccination 08/12/2019, 09/05/2019, 04/15/2020, 10/13/2020   Pfizer Covid-19 Vaccine Bivalent Booster 91yrs & up 03/17/2021   Pneumococcal Conjugate-13 05/08/2015   Pneumococcal Polysaccharide-23  10/24/2007, 03/03/2017   Td 03/26/2004   Tdap 03/13/2013   Zoster, Live 03/13/2013, 05/25/2021    Past Medical History:  Diagnosis Date   Arthritis    Bronchitis    history of   Dysrhythmia    ":Due to MVP"   GERD (gastroesophageal reflux disease)    Hiatal hernia    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 last stress test 2003   Pneumonia 2009   Tumor cells, benign    lung, left side   Unstable angina (HCC) 10/28/2017    Tobacco History: Social History   Tobacco Use  Smoking Status Former   Packs/day: 1.50   Years: 24.00   Pack years: 36.00   Types: Cigarettes   Start date: 1966   Quit date: 07/05/1985   Years since quitting: 36.3  Smokeless Tobacco Never   Counseling given: Not Answered   Outpatient Medications Prior to Visit  Medication Sig Dispense Refill   albuterol (PROVENTIL) (2.5 MG/3ML) 0.083% nebulizer solution Take 3 mLs (2.5 mg total) by nebulization every 6 (six) hours as needed for wheezing or shortness of breath. 75 mL 5   albuterol (VENTOLIN HFA) 108 (90 Base) MCG/ACT inhaler Inhale 1-2 puffs into the lungs every 6 (six) hours as needed for wheezing or shortness of breath. 18 g 2   aspirin 81 MG chewable tablet Chew 81 mg by mouth daily.     benzonatate (TESSALON) 200 MG capsule Take 1  capsule (200 mg total) by mouth 3 (three) times daily as needed for cough. 40 capsule 0   celecoxib (CELEBREX) 200 MG capsule 1 capsule with food as needed     famotidine (PEPCID) 20 MG tablet Take 1 tablet (20 mg total) by mouth at bedtime. 30 tablet 3   fluticasone (FLONASE) 50 MCG/ACT nasal spray Place 1 spray into both nostrils daily. 18.2 mL 2   methylphenidate (RITALIN) 10  MG tablet 1 tablet on empty stomach     metoprolol succinate (TOPROL XL) 25 MG 24 hr tablet Take 1 tablet (25 mg total) by mouth daily. 90 tablet 3   mirtazapine (REMERON) 15 MG tablet Take 15 mg by mouth at bedtime.     Multiple Vitamin (MULTIVITAMIN) tablet Take 1 tablet by mouth daily.     omeprazole (PRILOSEC) 40 MG capsule Take 40 mg by mouth every morning.     polyethylene glycol (MIRALAX / GLYCOLAX) packet Take 17 g by mouth daily as needed (for constipation.).      predniSONE (DELTASONE) 10 MG tablet 4 tabs for 2 days, then 3 tabs for 2 days, 2 tabs for 2 days, then 1 tab for 2 days, then stop 20 tablet 0   predniSONE (DELTASONE) 10 MG tablet 2 x 2 days, 1 x 3 days, then stop 7 tablet 0   rosuvastatin (CRESTOR) 10 MG tablet TAKE 1 TABLET(10 MG) BY MOUTH DAILY 90 tablet 3   SYNTHROID 25 MCG tablet Take 25 mcg by mouth daily before breakfast.      tamsulosin (FLOMAX) 0.4 MG CAPS capsule Take 0.4 mg by mouth daily after breakfast.      Tiotropium Bromide-Olodaterol (STIOLTO RESPIMAT) 2.5-2.5 MCG/ACT AERS Inhale 2 puffs into the lungs daily. 4 g 0   No facility-administered medications prior to visit.     Review of Systems:   Constitutional: No weight loss or gain, night sweats, fevers, chills, fatigue, or lassitude. HEENT: No headaches, difficulty swallowing, tooth/dental problems, or sore throat. No sneezing, itching, ear ache, nasal congestion, or post nasal drip CV:  No chest pain, orthopnea, PND, swelling in lower extremities, anasarca, dizziness, palpitations, syncope Resp: No shortness of breath with exertion or at rest. No excess mucus or change in color of mucus. No productive or non-productive. No hemoptysis. No wheezing.  No chest wall deformity GI:  No heartburn, indigestion, abdominal pain, nausea, vomiting, diarrhea, change in bowel habits, loss of appetite, bloody stools.  GU: No dysuria, change in color of urine, urgency or frequency.  No flank pain, no hematuria  Skin:  No rash, lesions, ulcerations MSK:  No joint pain or swelling.  No decreased range of motion.  No back pain. Neuro: No dizziness or lightheadedness.  Psych: No depression or anxiety. Mood stable.     Physical Exam:  There were no vitals taken for this visit.  GEN: Pleasant, interactive, well-nourished/chronically-ill appearing/acutely-ill appearing/poorly-nourished/morbidly obese; in no acute distress.****** HEENT:  Normocephalic and atraumatic. EACs patent bilaterally. TM pearly gray with present light reflex bilaterally. PERRLA. Sclera white. Nasal turbinates pink, moist and patent bilaterally. No rhinorrhea present. Oropharynx pink and moist, without exudate or edema. No lesions, ulcerations, or postnasal drip.  NECK:  Supple w/ fair ROM. No JVD present. Normal carotid impulses w/o bruits. Thyroid symmetrical with no goiter or nodules palpated. No lymphadenopathy.   CV: RRR, no m/r/g, no peripheral edema. Pulses intact, +2 bilaterally. No cyanosis, pallor or clubbing. PULMONARY:  Unlabored, regular breathing. Clear bilaterally A&P w/o wheezes/rales/rhonchi. No accessory muscle use.  No dullness to percussion. GI: BS present and normoactive. Soft, non-tender to palpation. No organomegaly or masses detected. No CVA tenderness. MSK: No erythema, warmth or tenderness. Cap refil <2 sec all extrem. No deformities or joint swelling noted.  Neuro: A/Ox3. No focal deficits noted.   Skin: Warm, no lesions or rashe Psych: Normal affect and behavior. Judgement and thought content appropriate.     Lab Results:  CBC    Component Value Date/Time   WBC 6.4 07/18/2020 1146   RBC 4.68 07/18/2020 1146   HGB 15.0 07/18/2020 1146   HGB 15.4 08/17/2019 1517   HCT 43.3 07/18/2020 1146   PLT 174.0 07/18/2020 1146   PLT 112 (L) 08/17/2019 1517   MCV 92.5 07/18/2020 1146   MCH 30.5 08/17/2019 1517   MCHC 34.6 07/18/2020 1146   RDW 13.2 07/18/2020 1146   LYMPHSABS 1.8 07/18/2020 1146   MONOABS 0.6  07/18/2020 1146   EOSABS 0.2 07/18/2020 1146   BASOSABS 0.0 07/18/2020 1146    BMET    Component Value Date/Time   NA 142 07/18/2020 1146   NA 142 02/13/2018 0851   K 4.2 07/18/2020 1146   CL 104 07/18/2020 1146   CO2 29 07/18/2020 1146   GLUCOSE 90 07/18/2020 1146   BUN 22 07/18/2020 1146   BUN 16 02/13/2018 0851   CREATININE 1.20 11/10/2021 1441   CREATININE 1.01 08/17/2019 1517   CALCIUM 9.0 07/18/2020 1146   CALCIUM 9.0 02/19/2011 1233   GFRNONAA >60 08/17/2019 1517   GFRAA >60 08/17/2019 1517    BNP    Component Value Date/Time   BNP 42.2 06/19/2019 0902     Imaging:  DG Chest 2 View  Result Date: 11/03/2021 CLINICAL DATA:  Shortness of breath, cough EXAM: CHEST - 2 VIEW COMPARISON:  06/03/2021 FINDINGS: Airway thickening is present, suggesting bronchitis or reactive airways disease. Heart size within normal limits. Thoracic spondylosis. No airspace opacity. No blunting the costophrenic angle. IMPRESSION: 1. Airway thickening is present, suggesting bronchitis or reactive airways disease. 2. Thoracic spondylosis. Electronically Signed   By: Gaylyn Rong M.D.   On: 11/03/2021 12:28   CT Chest W Contrast  Result Date: 11/12/2021 CLINICAL DATA:  Chronic dyspnea, COPD exacerbation EXAM: CT CHEST WITH CONTRAST TECHNIQUE: Multidetector CT imaging of the chest was performed during intravenous contrast administration. RADIATION DOSE REDUCTION: This exam was performed according to the departmental dose-optimization program which includes automated exposure control, adjustment of the mA and/or kV according to patient size and/or use of iterative reconstruction technique. CONTRAST:  75mL OMNIPAQUE IOHEXOL 300 MG/ML  SOLN COMPARISON:  08/15/2020 FINDINGS: Cardiovascular: Aortic atherosclerosis. Normal heart size. Three-vessel coronary artery calcifications. No pericardial effusion. Mediastinum/Nodes: No enlarged mediastinal, hilar, or axillary lymph nodes. Fat containing hiatal  hernia. Thyroid gland, trachea, and esophagus demonstrate no significant findings. Lungs/Pleura: Minimal paraseptal emphysema. Unchanged mild, tubular bronchiectasis throughout. Background of very tiny centrilobular pulmonary nodules, most concentrated at the lung apices. More discrete, small pulmonary nodules are stable and definitively benign, for example a 0.5 cm nodule of the peripheral left lower lobe (series 5, image 89). No pleural effusion or pneumothorax. Upper Abdomen: No acute abnormality.  Status post cholecystectomy. Musculoskeletal: No chest wall abnormality. No suspicious osseous lesions identified. IMPRESSION: 1. Minimal paraseptal emphysema. Unchanged mild, tubular bronchiectasis throughout. 2. Background of very tiny centrilobular pulmonary nodules, most concentrated at the lung apices, most commonly seen in smoking-related respiratory bronchiolitis. 3. Additional more discrete, small pulmonary nodules are stable and definitively benign, no further follow-up  or characterization required. 4. Coronary artery disease. Aortic Atherosclerosis (ICD10-I70.0) and Emphysema (ICD10-J43.9). Electronically Signed   By: Jearld Lesch M.D.   On: 11/12/2021 08:09         Latest Ref Rng & Units 07/31/2021   11:52 AM 08/04/2020    8:53 AM 03/11/2020   10:06 AM 09/25/2019    4:01 PM  PFT Results  FVC-Pre L 2.99   2.95  P 3.17   3.15    FVC-Predicted Pre % 63   61  P 65   65    FVC-Post L 3.10       FVC-Predicted Post % 65       Pre FEV1/FVC % % 84   84  P 83   83    Post FEV1/FCV % % 87       FEV1-Pre L 2.52   2.48  P 2.64   2.63    FEV1-Predicted Pre % 72   70  P 74   73    FEV1-Post L 2.69       DLCO uncorrected ml/min/mmHg 22.06    24.75   21.79    DLCO UNC% % 80    89   78    DLCO corrected ml/min/mmHg 22.06    24.75   21.32    DLCO COR %Predicted % 80    89   76    DLVA Predicted % 126    121   112    TLC L 4.84       TLC % Predicted % 65       RV % Predicted % 68         P Preliminary  result    Lab Results  Component Value Date   NITRICOXIDE 14 05/26/2016        Assessment & Plan:   No problem-specific Assessment & Plan notes found for this encounter.   I spent *** minutes of dedicated to the care of this patient on the date of this encounter to include pre-visit review of records, face-to-face time with the patient discussing conditions above, post visit ordering of testing, clinical documentation with the electronic health record, making appropriate referrals as documented, and communicating necessary findings to members of the patients care team.  Noemi Chapel, NP 11/17/2021  Pt aware and understands NP's role.

## 2021-11-17 NOTE — Patient Instructions (Addendum)
Continue Breztri 2 puffs Twice daily. Brush tongue and rinse mouth afterwards  ?Continue Albuterol inhaler 2 puffs or 3 mL neb every 6 hours as needed for shortness of breath or wheezing. Notify if symptoms persist despite rescue inhaler/neb use. Use neb then flutter valve Twice daily  ?Continue flonase nasal spray 1-2 sprays each nostril daily  ?Continue Mucinex ER Twice daily for chest congestion ?Continue Flutter valve 2-3 times a day  ?Continue pepcid 20 mg daily at bedtime ?Continue omeprazole 40 mg daily for reflux  ? ?Singulair 10 mg At bedtime  ?Zyrtec 10 mg daily  ?Chlorpheniramine tab 4 mg at night over the counter for cough ?Delsym 2 tsp Twice daily for cough ?Tessalon perles 1 capsule Three times a day for cough ?Prednisone taper. 4 tabs for 3 days, then 3 tabs for 3 days, 2 tabs for 3 days, then 1 tab for 3 days, then stop. Start tomorrow. Take in AM with food.  ? ?Labs today - BMET, BNP, CBC with diff ? ?Call cardiology to notify them of your lower blood pressure and dizziness. May need to adjust your BP meds.  ?  ?Follow up in 2 weeks with [name] or Alanson Aly. If symptoms do not improve or worsen, please contact office for sooner follow up or seek emergency care ?

## 2021-11-18 ENCOUNTER — Telehealth: Payer: Self-pay | Admitting: Cardiovascular Disease

## 2021-11-18 ENCOUNTER — Telehealth: Payer: Self-pay | Admitting: Nurse Practitioner

## 2021-11-18 ENCOUNTER — Telehealth (HOSPITAL_COMMUNITY): Payer: Self-pay | Admitting: *Deleted

## 2021-11-18 DIAGNOSIS — R3912 Poor urinary stream: Secondary | ICD-10-CM | POA: Diagnosis not present

## 2021-11-18 DIAGNOSIS — R3911 Hesitancy of micturition: Secondary | ICD-10-CM | POA: Diagnosis not present

## 2021-11-18 DIAGNOSIS — N3943 Post-void dribbling: Secondary | ICD-10-CM | POA: Diagnosis not present

## 2021-11-18 DIAGNOSIS — N401 Enlarged prostate with lower urinary tract symptoms: Secondary | ICD-10-CM | POA: Diagnosis not present

## 2021-11-18 NOTE — Telephone Encounter (Signed)
Called patient and he had questions about his AVS from his last office visit with Joellen Jersey. He wants to know if Joellen Jersey wants him to continue to the: ? ?Chlorpheniramine tab 4 mg at night over the counter for cough ?Delsym 2 tsp Twice daily for cough ?Tessalon perles 1 capsule Three times a day for cough ? ?Katie please advise  ?

## 2021-11-18 NOTE — Telephone Encounter (Signed)
Called [name] due to his recent absence from the [facility] program. He states that he has had a 3 weeks of SOB and on and off of steroids. He was placed on steroids again yesterday. He states that he is feeling some better today and plans to return to PF on Thursday 5/18. His BP has been running low and he has had some bouts of dizziness. He has been in contact with his cardiologist and they has adjusted his medications. Told him that we would take his BP more frequently at [facility] and give him this information to take with him to the cardiologist. ?

## 2021-11-18 NOTE — Telephone Encounter (Signed)
Called and spoke to patient who verified that he did not check his BP prior to going to pulmonology appointment, but did take the Toprol XL '25mg'$ . Pt upon arrival at pulmonology felt tired and swimmy headed, "faint feeling," but did not pass out. Pt states that he has had several episodes "in the past" of feeling this way, and has been told by pulmonology that his pressures run low. Pt does not check his BP at home, but has a meter. Confirms he is staying hydrated and states "I drink water all throughout the day."  Per OV notes  on 07/31/21, patient is on the Metoprolol for chest pains which he attests that medication helps, and does not have a history of HTN. Advised patient to check his BP before the medication each morning and hold if systolic is 119 or less. Pt will decrease to 1/2 tablet daily for one week and continue to monitor his BP before taking and approximately 1-2 hours after taking, and will report back his log to [place] via MyChart in 1 week. ?

## 2021-11-18 NOTE — Telephone Encounter (Signed)
Pt c/o BP issue: STAT if pt c/o blurred vision, one-sided weakness or slurred speech ? ?1. What are your last 5 BP readings?  ?90/50 ? ?2. Are you having any other symptoms (ex. Dizziness, headache, blurred vision, passed out)? Dizziness, weakness ? ?3. What is your BP issue? Patient went to his Pulmonologist and his BP was low. He did not check it before his visit but he has been checking it since. His Pulmonologist mentioned that his BP meds may need to be adjusted ? ?STAT if patient feels like he/she is going to faint  ? ?Are you dizzy now? no ? ?Do you feel faint or have you passed out? Patient states he did not actually faint but he felt as if he could  ? ?Do you have any other symptoms?  ? ?Have you checked your HR and BP (record if available)? 90/50 ? ?Please call patient on his cell phone as he will not be at home  ?

## 2021-11-19 ENCOUNTER — Encounter (HOSPITAL_COMMUNITY)
Admission: RE | Admit: 2021-11-19 | Discharge: 2021-11-19 | Disposition: A | Discharge status: Home / Self Care | Payer: Medicare Other | Source: Ambulatory Visit | Attending: Internal Medicine | Admitting: Internal Medicine

## 2021-11-19 DIAGNOSIS — Z5189 Encounter for other specified aftercare: Secondary | ICD-10-CM | POA: Diagnosis not present

## 2021-11-19 DIAGNOSIS — J849 Interstitial pulmonary disease, unspecified: Secondary | ICD-10-CM

## 2021-11-19 DIAGNOSIS — J432 Centrilobular emphysema: Secondary | ICD-10-CM | POA: Diagnosis not present

## 2021-11-19 NOTE — Progress Notes (Signed)
Daily Session Note  Patient Details  Name: [name] MRN: [id number] Date of Birth: October 19, 1950 Referring Provider:   April Manson Pulmonary Rehab Walk Test from 10/05/2021 in [place]  Referring Provider Ramaswamy       Encounter Date: 11/19/2021  Check In:  Session Check In - 11/19/21 1126       Check-In   Supervising physician immediately available to respond to emergencies [facility] immediately available    Physician(s) Dr. Tawanna Solo    Location [facility]    Staff Present Rosebud Poles, RN, BSN;Lisa Ysidro Evert, Cathleen Fears, MS, ACSM-CEP, Exercise Physiologist    Virtual Visit No    Medication changes reported     No    Fall or balance concerns reported    No    Tobacco Cessation No Change    Warm-up and Cool-down Performed as group-led instruction    Resistance Training Performed Yes    VAD Patient? No    PAD/SET Patient? No      Pain Assessment   Currently in Pain? No/denies    Multiple Pain Sites No             Capillary Blood Glucose: No results found for this or any previous visit (from the past 24 hour(s)).    Social History   Tobacco Use  Smoking Status Former   Packs/day: 1.50   Years: 24.00   Pack years: 36.00   Types: Cigarettes   Start date: 1966   Quit date: 07/05/1985   Years since quitting: 36.4  Smokeless Tobacco Never    Goals Met:  Proper associated with RPD/PD & O2 Sat Exercise tolerated well No report of concerns or symptoms today Strength training completed today  Goals Unmet:  Not Applicable  Comments: Service time is from [place] to [place]    Dr. Rodman Pickle is Medical Director for Pulmonary Rehab at [facility].

## 2021-11-19 NOTE — Telephone Encounter (Signed)
Called and spoke with the pharmacist Anderson Malta because Bethany wasn't there. Verified quantity and the instructions on the prednisone.   Nothing further needed.

## 2021-11-23 DIAGNOSIS — Z20822 Contact with and (suspected) exposure to covid-19: Secondary | ICD-10-CM | POA: Diagnosis not present

## 2021-11-23 DIAGNOSIS — H109 Unspecified conjunctivitis: Secondary | ICD-10-CM | POA: Diagnosis not present

## 2021-11-23 DIAGNOSIS — R059 Cough, unspecified: Secondary | ICD-10-CM | POA: Diagnosis not present

## 2021-11-23 DIAGNOSIS — J019 Acute sinusitis, unspecified: Secondary | ICD-10-CM | POA: Diagnosis not present

## 2021-11-24 ENCOUNTER — Encounter (HOSPITAL_COMMUNITY): Payer: Medicare Other

## 2021-11-24 ENCOUNTER — Telehealth (HOSPITAL_COMMUNITY): Payer: Self-pay | Admitting: Family Medicine

## 2021-11-25 ENCOUNTER — Telehealth (HOSPITAL_COMMUNITY): Payer: Self-pay | Admitting: *Deleted

## 2021-11-25 NOTE — Progress Notes (Signed)
Pulmonary Individual Treatment Plan  Patient Details  Name: [name] MRN: [id number] Date of Birth: 20-Jul-1950 Referring Provider:   April Manson Pulmonary Rehab Walk Test from 10/05/2021 in [facility]  Referring Provider Ramaswamy       Initial Encounter Date:  Flowsheet Row Pulmonary Rehab Walk Test from 10/05/2021 in [facility]  Date 10/05/21       Visit Diagnosis: Interstitial lung disease ([place])  Centrilobular emphysema ([place])  Patient's Home Medications on Admission:   Current Outpatient Medications:    albuterol (PROVENTIL) (2.5 MG/3ML) 0.083% nebulizer solution, Take 3 mLs (2.5 mg total) by nebulization every 6 (six) hours as needed for wheezing or shortness of breath., Disp: 75 mL, Rfl: 5   albuterol (VENTOLIN HFA) 108 (90 Base) MCG/ACT inhaler, Inhale 1-2 puffs into the lungs every 6 (six) hours as needed for wheezing or shortness of breath., Disp: 18 g, Rfl: 2   aspirin 81 MG chewable tablet, Chew 81 mg by mouth daily., Disp: , Rfl:    benzonatate (TESSALON) 200 MG capsule, Take 1 capsule (200 mg total) by mouth 3 (three) times daily as needed for cough., Disp: 40 capsule, Rfl: 0   Budeson-Glycopyrrol-Formoterol (BREZTRI AEROSPHERE) 160-9-4.8 MCG/ACT AERO, Inhale 2 puffs into the lungs in the morning and at bedtime., Disp: 10.7 g, Rfl: 0   celecoxib (CELEBREX) 200 MG capsule, 1 capsule with food as needed, Disp: , Rfl:    famotidine (PEPCID) 20 MG tablet, Take 1 tablet (20 mg total) by mouth at bedtime., Disp: 30 tablet, Rfl: 3   fluticasone (FLONASE) 50 MCG/ACT nasal spray, Place 1 spray into both nostrils daily., Disp: 18.2 mL, Rfl: 2   methylphenidate (RITALIN) 10 MG tablet, 1 tablet on empty stomach, Disp: , Rfl:    metoprolol succinate (TOPROL XL) 25 MG 24 hr tablet, Take 1 tablet (25 mg total) by mouth daily., Disp: 90 tablet, Rfl: 3   mirtazapine (REMERON) 15 MG tablet, Take 15 mg by mouth at  bedtime., Disp: , Rfl:    montelukast (SINGULAIR) 10 MG tablet, Take 1 tablet (10 mg total) by mouth at bedtime., Disp: 30 tablet, Rfl: 5   Multiple Vitamin (MULTIVITAMIN) tablet, Take 1 tablet by mouth daily., Disp: , Rfl:    omeprazole (PRILOSEC) 40 MG capsule, Take 40 mg by mouth every morning., Disp: , Rfl:    polyethylene glycol (MIRALAX / GLYCOLAX) packet, Take 17 g by mouth daily as needed (for constipation.). , Disp: , Rfl:    predniSONE (DELTASONE) 10 MG tablet, 4 tabs for 3 days, then 3 tabs for 3 days, 2 tabs for 3 days, then 1 tab for 3 days, then stop, Disp: 20 tablet, Rfl: 0   rosuvastatin (CRESTOR) 10 MG tablet, TAKE 1 TABLET(10 MG) BY MOUTH DAILY, Disp: 90 tablet, Rfl: 3   SYNTHROID 25 MCG tablet, Take 25 mcg by mouth daily before breakfast. , Disp: , Rfl:    tamsulosin (FLOMAX) 0.4 MG CAPS capsule, Take 0.4 mg by mouth daily after breakfast. 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 last stress test 2003   Pneumonia 2009   Tumor cells, benign    lung, left side   Unstable angina ([place]) 10/28/2017    Tobacco Use: Social History   Tobacco Use  Smoking Status Former   Packs/day: 1.50   Years: 24.00   Pack years: 36.00   Types: Cigarettes   Start date: 1966   Quit date: 07/05/1985   Years since quitting: 36.4  Smokeless Tobacco Never     Labs: Review Flowsheet        Latest Ref Rng & Units 10/21/2015 10/28/2017 10/29/2017 02/13/2018  Labs for ITP Cardiac and Pulmonary Rehab  Cholestrol 100 - 199 mg/dL 189    128   134    LDL (calc) 0 - 99 mg/dL 124    76   66    HDL-C >39 mg/dL 43    38   41    Trlycerides 0 - 149 mg/dL 110    69   137    Hemoglobin A1c 4.8 - 5.6 % 5.9   5.4      TCO2 22 - 32 mmol/L         12/29/2018  Labs for ITP Cardiac and Pulmonary Rehab  Cholestrol   LDL (calc)   HDL-C   Trlycerides   Hemoglobin A1c   TCO2 30            Capillary Blood Glucose: Lab Results  Component Value Date   GLUCAP 152 (H) 12/29/2018   GLUCAP 136 (H) 03/15/2016   GLUCAP 102 (H) 09/05/2015   GLUCAP 84 09/05/2015   GLUCAP 121 (H) 09/04/2015     Pulmonary Assessment Scores:  Pulmonary Assessment Scores     Row Name 10/05/21 0922         ADL UCSD   ADL Phase Entry     SOB Score total 37       CAT Score   CAT Score 6       mMRC Score   mMRC Score 3             UCSD: Self-administered rating of dyspnea associated with activities of daily living (ADLs) 6-point scale (0 = "not at all" to 5 = "maximal or unable to do because of breathlessness")  Scoring Scores range from 0 to 120.  Minimally important difference is 5 units  CAT: CAT can identify the health impairment of COPD patients and is better correlated with disease progression.  CAT has a scoring range of zero to 40. The CAT score is classified into four groups of low (less than 10), medium (10 - 20), high (21-30) and very high (31-40) based on the impact level of disease on health status. A CAT score over 10 suggests significant symptoms.  A worsening CAT score could be explained by an exacerbation, poor medication adherence, poor inhaler technique, or progression of COPD or comorbid conditions.  CAT MCID is 2 points  mMRC: mMRC (Modified Medical Research Council) Dyspnea Scale is used to assess the degree of baseline functional  disability in patients of respiratory disease due to dyspnea. No minimal important difference is established. A decrease in score  of 1 point or greater is considered a positive change.   Pulmonary Function Assessment:  Pulmonary Function Assessment - 10/05/21 0911       Breath   Bilateral Breath Sounds Clear    Shortness of Breath Yes;Limiting activity             Exercise Target Goals: Exercise Program Goal: Individual exercise prescription set using results from initial 6 min walk test and THRR while considering  patient's activity barriers and safety.   Exercise Prescription Goal: Initial exercise prescription builds to 30-45 minutes a day of aerobic activity, 2-3 days per week.  Home exercise guidelines will be given to patient during program as part of exercise prescription that the participant will acknowledge.  Activity Barriers & Risk Stratification:  Activity Barriers & Cardiac Risk Stratification - 10/05/21 0912       Activity Barriers & Cardiac Risk Stratification   Activity Barriers Arthritis;Deconditioning;Left Hip Replacement;Shortness of Breath;Muscular Weakness;History of Falls    Cardiac Risk Stratification Moderate             6 Minute Walk:  6 Minute Walk     Row Name 10/05/21 0953         6 Minute Walk   Phase Initial     Distance 1398 feet     Walk Time 6 minutes     # of Rest Breaks 0     MPH 2.65     METS 3.16     RPE 11     Perceived Dyspnea  0.5     VO2 Peak 11.06     Symptoms No     Resting HR 79 bpm     Resting BP 120/66     Resting Oxygen Saturation  98 %     Exercise Oxygen Saturation  during 6 min walk 99 %     Max Ex. HR 100 bpm     Max Ex. BP 124/64     2 Minute Post BP 112/60       Interval HR   1 Minute HR 97     2 Minute HR 100     3 Minute HR 100     4 Minute HR 100     5 Minute HR 100     6 Minute HR 99     2 Minute Post HR 82     Interval Heart Rate? Yes       Interval Oxygen   Interval Oxygen? Yes      Baseline Oxygen Saturation % 98 %     1 Minute Oxygen Saturation % 100 %     1 Minute Liters of Oxygen 0 L     2 Minute Oxygen Saturation % 99 %     2 Minute Liters of Oxygen 0 L     3 Minute Oxygen Saturation % 99 %     3 Minute Liters of Oxygen 0 L     4 Minute Oxygen Saturation % 99 %     4 Minute Liters of Oxygen 0 L     5 Minute Oxygen Saturation % 99 %     5 Minute Liters of Oxygen 0 L     6 Minute Oxygen Saturation % 99 %     6 Minute Liters of Oxygen 0 L     2 Minute Post Oxygen Saturation % 100 %     2 Minute Post Liters of Oxygen 0 L  Oxygen Initial Assessment:  Oxygen Initial Assessment - 10/05/21 0909       Home Oxygen   Home Oxygen Device None    Sleep Oxygen Prescription None    Home Exercise Oxygen Prescription None    Home Resting Oxygen Prescription None    Compliance with Home Oxygen Use Yes      Initial 6 min Walk   Oxygen Used None      Program Oxygen Prescription   Program Oxygen Prescription None      Intervention   Short Term Goals To learn and exhibit compliance with exercise, home and travel O2 prescription;To learn and understand importance of maintaining oxygen saturations>88%;To learn and demonstrate proper use of respiratory medications;To learn and understand importance of monitoring SPO2 with pulse oximeter and demonstrate accurate use of the pulse oximeter.;To learn and demonstrate proper pursed lip breathing techniques or other breathing techniques. ;Other    Long  Term Goals Exhibits compliance with exercise, home  and travel O2 prescription;Verbalizes importance of monitoring SPO2 with pulse oximeter and return demonstration;Maintenance of O2 saturations>88%;Exhibits proper breathing techniques, such as pursed lip breathing or other method taught during program session;Compliance with respiratory medication;Other             Oxygen Re-Evaluation:  Oxygen Re-Evaluation     Row Name 10/21/21 1107 11/16/21 1156            Program Oxygen Prescription   Program Oxygen Prescription None None        Home Oxygen   Home Oxygen Device None None      Sleep Oxygen Prescription None None      Home Exercise Oxygen Prescription None None      Home Resting Oxygen Prescription None None      Compliance with Home Oxygen Use Yes Yes        Goals/Expected Outcomes   Short Term Goals To learn and exhibit compliance with exercise, home and travel O2 prescription;To learn and understand importance of maintaining oxygen saturations>88%;To learn and demonstrate proper use of respiratory medications;To learn and understand importance of monitoring SPO2 with pulse oximeter and demonstrate accurate use of the pulse oximeter.;To learn and demonstrate proper pursed lip breathing techniques or other breathing techniques. ;Other To learn and exhibit compliance with exercise, home and travel O2 prescription;To learn and understand importance of maintaining oxygen saturations>88%;To learn and demonstrate proper use of respiratory medications;To learn and understand importance of monitoring SPO2 with pulse oximeter and demonstrate accurate use of the pulse oximeter.;To learn and demonstrate proper pursed lip breathing techniques or other breathing techniques. ;Other      Long  Term Goals Exhibits compliance with exercise, home  and travel O2 prescription;Verbalizes importance of monitoring SPO2 with pulse oximeter and return demonstration;Maintenance of O2 saturations>88%;Exhibits proper breathing techniques, such as pursed lip breathing or other method taught during program session;Compliance with respiratory medication;Other Exhibits compliance with exercise, home  and travel O2 prescription;Verbalizes importance of monitoring SPO2 with pulse oximeter and return demonstration;Maintenance of O2 saturations>88%;Exhibits proper breathing techniques, such as pursed lip breathing or other method taught during program session;Compliance with  respiratory medication;Other      Goals/Expected Outcomes Compliance and understanding of oxygen saturation monitoring and breathing techniques to decrease shortness of breath. Compliance and understanding of oxygen saturation monitoring and breathing techniques to decrease shortness of breath.               Oxygen Discharge (Final Oxygen Re-Evaluation):  Oxygen Re-Evaluation - 11/16/21 1156  Program Oxygen Prescription   Program Oxygen Prescription None      Home Oxygen   Home Oxygen Device None    Sleep Oxygen Prescription None    Home Exercise Oxygen Prescription None    Home Resting Oxygen Prescription None    Compliance with Home Oxygen Use Yes      Goals/Expected Outcomes   Short Term Goals To learn and exhibit compliance with exercise, home and travel O2 prescription;To learn and understand importance of maintaining oxygen saturations>88%;To learn and demonstrate proper use of respiratory medications;To learn and understand importance of monitoring SPO2 with pulse oximeter and demonstrate accurate use of the pulse oximeter.;To learn and demonstrate proper pursed lip breathing techniques or other breathing techniques. ;Other    Long  Term Goals Exhibits compliance with exercise, home  and travel O2 prescription;Verbalizes importance of monitoring SPO2 with pulse oximeter and return demonstration;Maintenance of O2 saturations>88%;Exhibits proper breathing techniques, such as pursed lip breathing or other method taught during program session;Compliance with respiratory medication;Other    Goals/Expected Outcomes Compliance and understanding of oxygen saturation monitoring and breathing techniques to decrease shortness of breath.             Initial Exercise Prescription:  Initial Exercise Prescription - 10/05/21 0900       Date of Initial Exercise RX and Referring Provider   Date 10/05/21    Referring Provider Ramaswamy    Expected Discharge Date 12/10/21       NuStep   Level 1    SPM 80    Minutes 15      Arm Ergometer   Level 1    Watts 4    Minutes 15      Prescription Details   Frequency (times per week) 2    Duration Progress to 30 minutes of continuous aerobic without signs/symptoms of physical distress      Intensity   THRR 40-80% of Max Heartrate 60-119    Ratings of Perceived Exertion 11-13    Perceived Dyspnea 0-4      Progression   Progression Continue to progress workloads to maintain intensity without signs/symptoms of physical distress.      Resistance Training   Training Prescription Yes    Weight blue bands    Reps 10-15             Perform Capillary Blood Glucose checks as needed.  Exercise Prescription Changes:   Exercise Prescription Changes     Row Name 10/20/21 1200 10/29/21 1543 11/17/21 1200         Response to Exercise   Blood Pressure (Admit) 96/58 102/58 108/62     Blood Pressure (Exercise) 110/70 -- --     Blood Pressure (Exit) 100/58 112/64 104/66     Heart Rate (Admit) 81 bpm 72 bpm 76 bpm     Heart Rate (Exercise) 101 bpm 88 bpm 94 bpm     Heart Rate (Exit) 92 bpm 89 bpm 77 bpm     Oxygen Saturation (Admit) 98 % 96 % 98 %     Oxygen Saturation (Exercise) 94 % 97 % 97 %     Oxygen Saturation (Exit) 95 % 97 % 96 %     Rating of Perceived Exertion (Exercise) '13 13 13     ' Perceived Dyspnea (Exercise) '1 1 1     ' Duration Progress to 30 minutes of  aerobic without signs/symptoms of physical distress Continue with 30 min of aerobic exercise without signs/symptoms of physical distress. Continue with  30 min of aerobic exercise without signs/symptoms of physical distress.     Intensity Other (comment)  40-80% of HRR THRR unchanged THRR unchanged       Progression   Progression Continue to progress workloads to maintain intensity without signs/symptoms of physical distress. Continue to progress workloads to maintain intensity without signs/symptoms of physical distress. Continue to progress  workloads to maintain intensity without signs/symptoms of physical distress.       Resistance Training   Training Prescription Yes Yes Yes     Weight Blue bands blue bands blue bands     Reps 10-15 10-15 10-15     Time 10 Minutes 10 Minutes 10 Minutes       NuStep   Level '2 3 3     ' SPM 80 80 80     Minutes '15 15 15     ' METs 2.1 2.7 2.7       Arm Ergometer   Level -- 1.5 1.5     Minutes -- 15 15     METs -- 2.2 2.1       Arm/Foot Ergometer   Level 1.5 -- --     Watts 21 -- --     Minutes 15 -- --              Exercise Comments:   Exercise Comments     Row Name 10/13/21 1224 11/10/21 1527         Exercise Comments Pt completed first day of exercise. Bob exercised for 15 min on the arm ergometer and Nustep. He averaged about 1.5 METs at level 1 on the arm ergometer and 2.6 METs at level 2 on the Nustep. He performed the warmup and cooldown standing without limitations. Completed home exercise plan. Mikki Santee is exercising at home inconsistently. He walks 1 non-rehab day/wk for 45-50 min/day. He stated that his plan is to walk 4 days/wk. I agreed with this plan. I encouraged him to walk for 30-45 min/day. He agreed with my recommendations. Mikki Santee is motivated to exercise and improve his functional capacity. He understands that he needs to get back to exercising around home. He wants to use his [employer] program to join a local fitness center. I encouraged him to do so and offered exercise class recommendations.               Exercise Goals and Review:   Exercise Goals     Row Name 10/05/21 0912 10/21/21 1104 11/16/21 1154         Exercise Goals   Increase Physical Activity Yes Yes Yes     Intervention Provide advice, education, support and counseling about physical activity/exercise needs.;Develop an individualized exercise prescription for aerobic and resistive training based on initial evaluation findings, risk stratification, comorbidities and participant's  personal goals. Provide advice, education, support and counseling about physical activity/exercise needs.;Develop an individualized exercise prescription for aerobic and resistive training based on initial evaluation findings, risk stratification, comorbidities and participant's personal goals. Provide advice, education, support and counseling about physical activity/exercise needs.;Develop an individualized exercise prescription for aerobic and resistive training based on initial evaluation findings, risk stratification, comorbidities and participant's personal goals.     Expected Outcomes Short Term: Attend rehab on a regular basis to increase amount of physical activity.;Long Term: Add in home exercise to make exercise part of routine and to increase amount of physical activity.;Long Term: Exercising regularly at least 3-5 days a week. Short Term: Attend rehab on a regular basis to  increase amount of physical activity.;Long Term: Add in home exercise to make exercise part of routine and to increase amount of physical activity.;Long Term: Exercising regularly at least 3-5 days a week. Short Term: Attend rehab on a regular basis to increase amount of physical activity.;Long Term: Add in home exercise to make exercise part of routine and to increase amount of physical activity.;Long Term: Exercising regularly at least 3-5 days a week.     Increase Strength and Stamina Yes Yes Yes     Intervention Provide advice, education, support and counseling about physical activity/exercise needs.;Develop an individualized exercise prescription for aerobic and resistive training based on initial evaluation findings, risk stratification, comorbidities and participant's personal goals. Provide advice, education, support and counseling about physical activity/exercise needs.;Develop an individualized exercise prescription for aerobic and resistive training based on initial evaluation findings, risk stratification, comorbidities  and participant's personal goals. Provide advice, education, support and counseling about physical activity/exercise needs.;Develop an individualized exercise prescription for aerobic and resistive training based on initial evaluation findings, risk stratification, comorbidities and participant's personal goals.     Expected Outcomes Short Term: Increase workloads from initial exercise prescription for resistance, speed, and METs.;Short Term: Perform resistance training exercises routinely during rehab and add in resistance training at home;Long Term: Improve cardiorespiratory fitness, muscular endurance and strength as measured by increased METs and functional capacity (6MWT) Short Term: Increase workloads from initial exercise prescription for resistance, speed, and METs.;Short Term: Perform resistance training exercises routinely during rehab and add in resistance training at home;Long Term: Improve cardiorespiratory fitness, muscular endurance and strength as measured by increased METs and functional capacity (6MWT) Short Term: Increase workloads from initial exercise prescription for resistance, speed, and METs.;Short Term: Perform resistance training exercises routinely during rehab and add in resistance training at home;Long Term: Improve cardiorespiratory fitness, muscular endurance and strength as measured by increased METs and functional capacity (6MWT)     Able to understand and use rate of perceived exertion (RPE) scale Yes Yes Yes     Intervention Provide education and explanation on how to use RPE scale Provide education and explanation on how to use RPE scale Provide education and explanation on how to use RPE scale     Expected Outcomes Short Term: Able to use RPE daily in rehab to express subjective intensity level;Long Term:  Able to use RPE to guide intensity level when exercising independently Short Term: Able to use RPE daily in rehab to express subjective intensity level;Long Term:  Able to  use RPE to guide intensity level when exercising independently Short Term: Able to use RPE daily in rehab to express subjective intensity level;Long Term:  Able to use RPE to guide intensity level when exercising independently     Able to understand and use Dyspnea scale Yes Yes Yes     Intervention Provide education and explanation on how to use Dyspnea scale Provide education and explanation on how to use Dyspnea scale Provide education and explanation on how to use Dyspnea scale     Expected Outcomes Short Term: Able to use Dyspnea scale daily in rehab to express subjective sense of shortness of breath during exertion;Long Term: Able to use Dyspnea scale to guide intensity level when exercising independently Short Term: Able to use Dyspnea scale daily in rehab to express subjective sense of shortness of breath during exertion;Long Term: Able to use Dyspnea scale to guide intensity level when exercising independently Short Term: Able to use Dyspnea scale daily in rehab to express subjective sense  of shortness of breath during exertion;Long Term: Able to use Dyspnea scale to guide intensity level when exercising independently     Knowledge and understanding of Target Heart Rate Range (THRR) Yes Yes Yes     Intervention Provide education and explanation of THRR including how the numbers were predicted and where they are located for reference Provide education and explanation of THRR including how the numbers were predicted and where they are located for reference Provide education and explanation of THRR including how the numbers were predicted and where they are located for reference     Expected Outcomes Short Term: Able to state/look up THRR;Long Term: Able to use THRR to govern intensity when exercising independently;Short Term: Able to use daily as guideline for intensity in rehab Short Term: Able to state/look up THRR;Long Term: Able to use THRR to govern intensity when exercising independently;Short  Term: Able to use daily as guideline for intensity in rehab Short Term: Able to state/look up THRR;Long Term: Able to use THRR to govern intensity when exercising independently;Short Term: Able to use daily as guideline for intensity in rehab     Understanding of Exercise Prescription Yes Yes Yes     Intervention Provide education, explanation, and written materials on patient's individual exercise prescription Provide education, explanation, and written materials on patient's individual exercise prescription Provide education, explanation, and written materials on patient's individual exercise prescription     Expected Outcomes Short Term: Able to explain program exercise prescription;Long Term: Able to explain home exercise prescription to exercise independently Short Term: Able to explain program exercise prescription;Long Term: Able to explain home exercise prescription to exercise independently Short Term: Able to explain program exercise prescription;Long Term: Able to explain home exercise prescription to exercise independently              Exercise Goals Re-Evaluation :  Exercise Goals Re-Evaluation     Row Name 10/21/21 1104 11/16/21 1154           Exercise Goal Re-Evaluation   Exercise Goals Review Increase Physical Activity;Increase Strength and Stamina;Able to understand and use rate of perceived exertion (RPE) scale;Able to understand and use Dyspnea scale;Knowledge and understanding of Target Heart Rate Range (THRR);Understanding of Exercise Prescription Increase Physical Activity;Increase Strength and Stamina;Able to understand and use rate of perceived exertion (RPE) scale;Able to understand and use Dyspnea scale;Knowledge and understanding of Target Heart Rate Range (THRR);Understanding of Exercise Prescription      Comments Mikki Santee has completed 3 exercise sessions. He exercises for 15 min on the arm ergometer and Nustep. He averages 2.1 METs at level 1.5 on the arm erg and 2.1  METs at level 2 on the Nustep. He increased his level on the Nustep on the first day of exercise. He has tolerated level 2 well. He also increased his level on the arm erg recently as he tolerated this well. Mikki Santee is motivated to exercise and improve his functional capacity. He seems to enjoy rehab. Will continue to monitor and progress as able. Mikki Santee has completed 8 exercise sessions. He exercises for 15 min on the arm ergometer and Nustep. He averages 2.1 METs at level 1.5 on the arm erg and 2.7 METs at level 3 on the Nustep. He increased his level on the Nustep and tolerates this well. He has not increased his level on the arm ergometer. Will consider increasing. Mikki Santee is motivated to exercise and improve his functional capacity. He exercises at home consistently. Will continue to monitor and progress as able.  Expected Outcomes Through exercise at rehab and at home, the patient will decrease shortness of breath with daily activities and feel confident in carrying out an exercise regime at home. Through exercise at rehab and at home, the patient will decrease shortness of breath with daily activities and feel confident in carrying out an exercise regime at home.               Discharge Exercise Prescription (Final Exercise Prescription Changes):  Exercise Prescription Changes - 11/17/21 1200       Response to Exercise   Blood Pressure (Admit) 108/62    Blood Pressure (Exit) 104/66    Heart Rate (Admit) 76 bpm    Heart Rate (Exercise) 94 bpm    Heart Rate (Exit) 77 bpm    Oxygen Saturation (Admit) 98 %    Oxygen Saturation (Exercise) 97 %    Oxygen Saturation (Exit) 96 %    Rating of Perceived Exertion (Exercise) 13    Perceived Dyspnea (Exercise) 1    Duration Continue with 30 min of aerobic exercise without signs/symptoms of physical distress.    Intensity THRR unchanged      Progression   Progression Continue to progress workloads to maintain intensity without signs/symptoms of  physical distress.      Resistance Training   Training Prescription Yes    Weight blue bands    Reps 10-15    Time 10 Minutes      NuStep   Level 3    SPM 80    Minutes 15    METs 2.7      Arm Ergometer   Level 1.5    Minutes 15    METs 2.1             Nutrition:  Target Goals: Understanding of nutrition guidelines, daily intake of sodium <1546m, cholesterol <2077m calories 30% from fat and 7% or less from saturated fats, daily to have 5 or more servings of fruits and vegetables.  Biometrics:    Nutrition Therapy Plan and Nutrition Goals:  Nutrition Therapy & Goals - 11/10/21 1158       Nutrition Therapy   Diet Heart Healthy Diet    Drug/Food Interactions Statins/Certain Fruits      Personal Nutrition Goals   Nutrition Goal Patient to identify and benefit of including fruits, vegetables, whole grains, low fat dairy, lean protein/plant protein as part of heart healthy meal plan.    Personal Goal #2 Patient to identify strategies for weight maintenance/weight gain during periods of COPD exacerbation/low PO intake.    Comments Patient reports decreased nutrition intake and decreased hunger during periods of COPD exacerbation. Discussed strategies for increasing calories and appropriate nutrition supplements to support caloric needs. Encouraged weight monitoring 1x/week as well. Patient amicable to RD suggestions.      Intervention Plan   Intervention Prescribe, educate and counsel regarding individualized specific dietary modifications aiming towards targeted core components such as weight, hypertension, lipid management, diabetes, heart failure and other comorbidities.;Nutrition handout(s) given to patient.   Patient reports no nutrition concerns at this time.   Expected Outcomes Long Term Goal: Adherence to prescribed nutrition plan.;Short Term Goal: A plan has been developed with personal nutrition goals set during dietitian appointment.             Nutrition  Assessments:  MEDIFICTS Score Key: ?70 Need to make dietary changes  40-70 Heart Healthy Diet ? 40 Therapeutic Level Cholesterol Diet   Picture Your Plate Scores: <4<10nhealthy dietary  pattern with much room for improvement. 41-50 Dietary pattern unlikely to meet recommendations for good health and room for improvement. 51-60 More healthful dietary pattern, with some room for improvement.  >60 Healthy dietary pattern, although there may be some specific behaviors that could be improved.    Nutrition Goals Re-Evaluation:   Nutrition Goals Discharge (Final Nutrition Goals Re-Evaluation):   Psychosocial: Target Goals: Acknowledge presence or absence of significant depression and/or stress, maximize coping skills, provide positive support system. Participant is able to verbalize types and ability to use techniques and skills needed for reducing stress and depression.  Initial Review & Psychosocial Screening:  Initial Psych Review & Screening - 10/05/21 0906       Initial Review   Current issues with History of Depression      Family Dynamics   Good Support System? Yes    Comments [name]      Barriers   Psychosocial barriers to participate in program There are no identifiable barriers or psychosocial needs.      Screening Interventions   Interventions Encouraged to exercise             Quality of Life Scores:  Scores of 19 and below usually indicate a poorer quality of life in these areas.  A difference of  2-3 points is a clinically meaningful difference.  A difference of 2-3 points in the total score of the Quality of Life Index has been associated with significant improvement in overall quality of life, self-image, physical symptoms, and general health in studies assessing change in quality of life.  PHQ-9: Review Flowsheet        10/05/2021 12/12/2019  Depression screen PHQ 2/9  Decreased Interest 0 0  Down, Depressed, Hopeless 0 0  PHQ - 2 Score 0 0  Altered  sleeping 0 3  Tired, decreased energy 1 3  Change in appetite 0 1  Feeling bad or failure about yourself  0 0  Trouble concentrating 0 0  Moving slowly or fidgety/restless 0 0  Suicidal thoughts 0 0  PHQ-9 Score 1   Difficult doing work/chores Not difficult at all Not difficult at all         Interpretation of Total Score  Total Score Depression Severity:  1-4 = Minimal depression, 5-9 = Mild depression, 10-14 = Moderate depression, 15-19 = Moderately severe depression, 20-27 = Severe depression   Psychosocial Evaluation and Intervention:   Psychosocial Re-Evaluation:  Psychosocial Re-Evaluation     [place] Name 10/22/21 0741 11/24/21 0950           Psychosocial Re-Evaluation   Current issues with History of Depression History of Depression      Comments Mikki Santee is participating in the PR porgam without any psychosocial barriers thus far. He continues on medications for depression so this has not been an issues for him since starting class. Mikki Santee has been participating in the [place] program without any psychosocial barriers. He has having issues wtih his SOB and low BP. He has had several absences and we have discussed him dropping from the program until his medical condition imporves. Last week he decided that he wanted to try to continue, but he reports this morning more of the same issues and not able to attend. I will reach out to him today for futher disscussion.      Expected Outcomes That Mikki Santee will continue to be able to participate in the PR program without any psychosocial issues or concerns. For Mikki Santee to attend the PR without  any psychosocial issues or concerns.      Interventions Encouraged to attend Pulmonary Rehabilitation for the exercise Encouraged to attend Pulmonary Rehabilitation for the exercise      Continue Psychosocial Services  Follow up required by staff Follow up required by staff               Psychosocial Discharge (Final Psychosocial Re-Evaluation):  Psychosocial  Re-Evaluation - 11/24/21 0950       Psychosocial Re-Evaluation   Current issues with History of Depression    Comments Mikki Santee has been participating in the PR program without any psychosocial barriers. He has having issues wtih his SOB and low BP. He has had several absences and we have discussed him dropping from the program until his medical condition imporves. Last week he decided that he wanted to try to continue, but he reports this morning more of the same issues and not able to attend. I will reach out to him today for futher disscussion.    Expected Outcomes For Mikki Santee to attend the PR without any psychosocial issues or concerns.    Interventions Encouraged to attend Pulmonary Rehabilitation for the exercise    Continue Psychosocial Services  Follow up required by staff             Education: Education Goals: Education classes will be provided on a weekly basis, covering required topics. Participant will state understanding/return demonstration of topics presented.  Learning Barriers/Preferences:  Learning Barriers/Preferences - 10/05/21 0908       Learning Barriers/Preferences   Learning Barriers None    Learning Preferences Computer/Internet;Audio;Group Instruction;Individual Instruction;Pictoral;Skilled Demonstration;Verbal Instruction;Video;Written Material             Education Topics: Risk Factor Reduction:  -Group instruction that is supported by a PowerPoint presentation. Instructor discusses the definition of a risk factor, different risk factors for pulmonary disease, and how the heart and lungs work together.     Nutrition for Pulmonary Patient:  -Group instruction provided by PowerPoint slides, verbal discussion, and written materials to support subject matter. The instructor gives an explanation and review of healthy diet recommendations, which includes a discussion on weight management, recommendations for fruit and vegetable consumption, as well as protein,  fluid, caffeine, fiber, sodium, sugar, and alcohol. Tips for eating when patients are short of breath are discussed. Flowsheet Row PULMONARY REHAB OTHER RESPIRATORY from 11/19/2021 in [place]  Date 11/12/21  Educator Sam  Instruction Review Code 1- Verbalizes Understanding       Pursed Lip Breathing:  -Group instruction that is supported by demonstration and informational handouts. Instructor discusses the benefits of pursed lip and diaphragmatic breathing and detailed demonstration on how to preform both.   Flowsheet Row PULMONARY REHAB OTHER RESPIRATORY from 01/24/2020 in [place]  Date 12/25/19  Educator Handout  Instruction Review Code --  [N/A]       Oxygen Safety:  -Group instruction provided by PowerPoint, verbal discussion, and written material to support subject matter. There is an overview of "What is Oxygen" and "Why do we need it".  Instructor also reviews how to create a safe environment for oxygen use, the importance of using oxygen as prescribed, and the risks of noncompliance. There is a brief discussion on traveling with oxygen and resources the patient may utilize.   Oxygen Equipment:  -Group instruction provided by [facility] Staff utilizing handouts, written materials, and equipment demonstrations.   Signs and Symptoms:  -Group instruction provided by  written material and verbal discussion to support subject matter. Warning signs and symptoms of infection, stroke, and heart attack are reviewed and when to call the physician/911 reinforced. Tips for preventing the spread of infection discussed.   Advanced Directives:  -Group instruction provided by verbal instruction and written material to support subject matter. Instructor reviews Advanced Directive laws and proper instruction for filling out document.   Pulmonary Video:  -Group video education that reviews the importance of medication and  oxygen compliance, exercise, good nutrition, pulmonary hygiene, and pursed lip and diaphragmatic breathing for the pulmonary patient.   Exercise for the Pulmonary Patient:  -Group instruction that is supported by a PowerPoint presentation. Instructor discusses benefits of exercise, core components of exercise, frequency, duration, and intensity of an exercise routine, importance of utilizing pulse oximetry during exercise, safety while exercising, and options of places to exercise outside of rehab.     Pulmonary Medications:  -Verbally interactive group education provided by instructor with focus on inhaled medications and proper administration.   Anatomy and Physiology of the Respiratory System and Intimacy:  -Group instruction provided by PowerPoint, verbal discussion, and written material to support subject matter. Instructor reviews respiratory cycle and anatomical components of the respiratory system and their functions. Instructor also reviews differences in obstructive and restrictive respiratory diseases with examples of each. Intimacy, Sex, and Sexuality differences are reviewed with a discussion on how relationships can change when diagnosed with pulmonary disease. Common sexual concerns are reviewed.   MD DAY -A group question and answer session with a medical doctor that allows participants to ask questions that relate to their pulmonary disease state.   OTHER EDUCATION -Group or individual verbal, written, or video instructions that support the educational goals of the pulmonary rehab program. [place] from 11/19/2021 in [place]  Date 11/19/21  [facility] Levels]  Educator Donnetta Simpers  Instruction Review Code 1- Verbalizes Understanding       Holiday Eating Survival Tips:  -Group instruction provided by [employer], verbal discussion, and written materials to support subject matter. The instructor gives  patients tips, tricks, and techniques to help them not only survive but enjoy the holidays despite the onslaught of food that accompanies the holidays.   Knowledge Questionnaire Score:  Knowledge Questionnaire Score - 10/05/21 0914       Knowledge Questionnaire Score   Pre Score 17/18             Core Components/Risk Factors/Patient Goals at Admission:  Personal Goals and Risk Factors at Admission - 10/05/21 0908       Core Components/Risk Factors/Patient Goals on Admission   Improve shortness of breath with ADL's Yes    Intervention Provide education, individualized exercise plan and daily activity instruction to help decrease symptoms of SOB with activities of daily living.    Expected Outcomes Short Term: Improve cardiorespiratory fitness to achieve a reduction of symptoms when performing ADLs;Long Term: Be able to perform more ADLs without symptoms or delay the onset of symptoms             Core Components/Risk Factors/Patient Goals Review:   Goals and Risk Factor Review     Row Name 10/22/21 0750 11/24/21 0955           Core Components/Risk Factors/Patient Goals Review   Personal Goals Review Improve shortness of breath with ADL's;Develop more efficient breathing techniques such as purse lipped breathing and diaphragmatic breathing and practicing self-pacing with activity.;Increase knowledge of  respiratory medications and ability to use respiratory devices properly. Improve shortness of breath with ADL's;Increase knowledge of respiratory medications and ability to use respiratory devices properly.;Develop more efficient breathing techniques such as purse lipped breathing and diaphragmatic breathing and practicing self-pacing with activity.      Review Mikki Santee has attended 4 session thus far. He has been progressing well with his exercise and METs.He reports mild SOB with activity. Mikki Santee has made some progress with his exercise sessions but , what has held him back has been his  lack of attndence due to his sickness.He has been slowly increasing his workloads and MET levels on the nustep and arm egometer.His oxygen saturations have been 95-98%  and he is reporting mild to mild with difficulty SOB on room air.      Expected Outcomes See admission goals.Improve SOB with ADL and better breathing techniques. See admission goals.               Core Components/Risk Factors/Patient Goals at Discharge (Final Review):   Goals and Risk Factor Review - 11/24/21 0955       Core Components/Risk Factors/Patient Goals Review   Personal Goals Review Improve shortness of breath with ADL's;Increase knowledge of respiratory medications and ability to use respiratory devices properly.;Develop more efficient breathing techniques such as purse lipped breathing and diaphragmatic breathing and practicing self-pacing with activity.    Review Mikki Santee has made some progress with his exercise sessions but , what has held him back has been his lack of attndence due to his sickness.He has been slowly increasing his workloads and MET levels on the nustep and arm egometer.His oxygen saturations have been 95-98%  and he is reporting mild to mild with difficulty SOB on room air.    Expected Outcomes See admission goals.             ITP Comments: Pt is making expected progress toward Pulmonary Rehab goals after completing 9 sessions. Recommend continued exercise, life style modification, education, and utilization of breathing techniques to increase stamina and strength, while also decreasing shortness of breath with exertion.  Dr. Rodman Pickle is Medical Director for Pulmonary Rehab at [facility].

## 2021-11-25 NOTE — Telephone Encounter (Signed)
Called and spoke with patient, provided clarification per [facility].  He verbalized understanding.  He stated he is not feeling any better and that he actually developed a sinus infection and is currently on amoxicillin and conjunctivitis.  I advised him to call if he develops a fever, chills, body aches or his mucous is looking worse rather than better.  He stated he will call back if not feeling better.  Nothing further needed.

## 2021-11-25 NOTE — Telephone Encounter (Signed)
Called [name] to check on him yesterday 11/24/21 since he called and cancelled his PR appointments for week. States the he is no better even after going to the doctor 5 times. I was not able to reach him but, was able to leave him a message to call us back.

## 2021-11-26 ENCOUNTER — Encounter (HOSPITAL_COMMUNITY): Payer: Medicare Other

## 2021-11-27 ENCOUNTER — Other Ambulatory Visit: Payer: Self-pay

## 2021-11-27 MED ORDER — METOPROLOL SUCCINATE ER 25 MG PO TB24: 25.0000 mg | ORAL_TABLET | Freq: Every day | ORAL | 2 refills | Status: DC

## 2021-12-01 ENCOUNTER — Ambulatory Visit (INDEPENDENT_AMBULATORY_CARE_PROVIDER_SITE_OTHER): Payer: Medicare Other | Admitting: Nurse Practitioner

## 2021-12-01 ENCOUNTER — Encounter: Payer: Self-pay | Admitting: Nurse Practitioner

## 2021-12-01 ENCOUNTER — Telehealth: Payer: Self-pay | Admitting: Nurse Practitioner

## 2021-12-01 ENCOUNTER — Encounter (HOSPITAL_COMMUNITY)
Admission: RE | Admit: 2021-12-01 | Discharge: 2021-12-01 | Disposition: A | Discharge status: Home / Self Care | Payer: Medicare Other | Source: Ambulatory Visit | Attending: Internal Medicine | Admitting: Internal Medicine

## 2021-12-01 VITALS — Wt 178.1 lb

## 2021-12-01 DIAGNOSIS — J309 Allergic rhinitis, unspecified: Secondary | ICD-10-CM

## 2021-12-01 DIAGNOSIS — J849 Interstitial pulmonary disease, unspecified: Secondary | ICD-10-CM

## 2021-12-01 DIAGNOSIS — J479 Bronchiectasis, uncomplicated: Secondary | ICD-10-CM

## 2021-12-01 DIAGNOSIS — J449 Chronic obstructive pulmonary disease, unspecified: Secondary | ICD-10-CM

## 2021-12-01 DIAGNOSIS — J432 Centrilobular emphysema: Secondary | ICD-10-CM | POA: Diagnosis not present

## 2021-12-01 DIAGNOSIS — Z5189 Encounter for other specified aftercare: Secondary | ICD-10-CM | POA: Diagnosis not present

## 2021-12-01 DIAGNOSIS — J4489 Other specified chronic obstructive pulmonary disease: Secondary | ICD-10-CM

## 2021-12-01 HISTORY — DX: Chronic obstructive pulmonary disease, unspecified: J44.9

## 2021-12-01 HISTORY — DX: Other specified chronic obstructive pulmonary disease: J44.89

## 2021-12-01 MED ORDER — BREZTRI AEROSPHERE 160-9-4.8 MCG/ACT IN AERO: 2.0000 | INHALATION_SPRAY | Freq: Two times a day (BID) | RESPIRATORY_TRACT | 0 refills | Status: DC

## 2021-12-01 NOTE — Assessment & Plan Note (Signed)
Well-controlled on current regimen.  Continue Singulair and Zyrtec for trigger prevention and Flonase for postnasal drainage control.

## 2021-12-01 NOTE — Telephone Encounter (Signed)
Patient was handed patient assistance paperwork during his office visit and he will bring it back to the office.

## 2021-12-01 NOTE — Assessment & Plan Note (Addendum)
Resolved exacerbation.  Appears to have recovered well.  Will continue on triple therapy with Breztri.  He reports that it is expensive, costing around $300 a month.  [place] with [place] today.  Advised him to return so we can fax for him.  Continue Zyrtec and Singulair for trigger prevention.  These do seem to have helped his cough significantly.  Attend pulmonary rehab as scheduled.  Patient Instructions  Continue Breztri 2 puffs Twice daily. Brush tongue and rinse mouth afterwards. Complete [place] paperwork and return so we can fax it for you. Continue Albuterol inhaler 2 puffs or 3 mL neb every 6 hours as needed for shortness of breath or wheezing. Notify if symptoms persist despite rescue inhaler/neb use. Use neb then flutter valve Twice daily  Continue flonase nasal spray 1-2 sprays each nostril daily  Continue Mucinex ER Twice daily for chest congestion Continue Flutter valve 2-3 times a day  Continue pepcid 20 mg daily at bedtime Continue omeprazole 40 mg daily for reflux  Continue Singulair 10 mg At bedtime  Continue Zyrtec 10 mg daily    Follow up in 3 months with [name] or Alanson Aly. If symptoms do not improve or worsen, please contact office for sooner follow up or seek emergency care

## 2021-12-01 NOTE — Progress Notes (Signed)
$'@Patient'x$  ID: [name], male    DOB: Sep 11, 1950, 71 y.o.   MRN: [id number]  Chief Complaint  Patient presents with   Follow-up    He is feeling some better since last ov. He reports his breathing has improved.     Referring provider: Jonathon Jordan, MD  HPI: 71 year old male, former smoker (36-pack-year history) followed for COPD with chronic bronchitis and emphysema and bronchiectasis.  He is a patient of Dr. Golden Pop and last seen in office 07/31/2021.  Past medical history significant for CAD, mitral valve disorder, cerebrovascular disease, Mobitz 1 second-degree AV block, unstable angina, allergic rhinitis, GERD with Barrett's esophagus, hyperparathyroidism status post parathyroidectomy, dementia, HLD.  TEST/EVENTS:  09/25/2019 PFTs: FVC 65, FEV1 73, ratio 83, DLCO corrected for alveolar volume 76% 03/11/2020 PFTs: FVC 65, FEV1 74, ratio 83, DLCOcor 89 08/04/2020 Spirometry: FVC 61, FEV1 70, ratio 84 08/15/2020 HRCT chest: Scattered atherosclerosis.  There is no LAD.  There is a primarily fat-containing hiatal hernia present.  There are scattered tiny centrilobular nodules at the lung apices.  Occasional stable, small definitively benign pulmonary nodules in the left lower lobe.  Minimal paraseptal emphysema is noted.  There is unchanged, mild tubular bronchiectasis.  No significant air trapping present. 07/31/2021 PFTs: FVC 65, FEV1 77, ratio 87, TLC 65, DLCOcor 80.  Mild obstruction and moderate restrictive airway disease with decreased lung volumes.  No significant BD but did have some mid flow reversibility. 11/10/2021 CT chest with contrast: Atherosclerosis and three-vessel CAD present.  No LAD present.  There is a fat-containing hiatal hernia.  Minimal paraseptal emphysema is present.  There is unchanged mild, tubular bronchiectasis throughout.  Background of very tiny centrilobular pulmonary nodules, most concentrated at the lung apices, most commonly seen in smoking-related  bronchiolitis.  More discrete  07/31/2021: OV with [name].  Experiences frequent episodes of recurrent bronchitis.  Has had 4 exacerbations in the last year, requiring antibiotic and prednisone.  PFTs were overall stable.  Still has a restriction with normal DLCO suggesting neuromuscular defect.  Has had a positive rheumatoid factor in the past but denied any major arthritis pains.  HRCT from a year ago showed minimal paraseptal emphysema.  Symptom score was stable.  Advised changing inhaler therapy to [employer].  Discussed alternative of including Daliresp to her regimen or doing triple therapy.  Check A1 AT phenotype MM  11/03/2021: OV with Cedricka Sackrider NP for worsening of COPD/bronchitis symptoms.  He reports increased productive cough over the last 5 days with green-yellow sputum production.  He has also had increased shortness of breath upon exertion.  He watches his grandchildren and feels like he has not been able to to do as much with them as he normally can.  Also has some increased generalized weakness.  Has not had any fevers that he is aware of.  He does report some recent decreased appetite.  Does not appear to have lost any weight since I last saw him in November.  Step up to [employer]. Treated with prednisone taper and doxy course. CT chest w contrast to r/o malignancy or other underlying pulm etiology. Close follow up  11/17/2021: OV with Dervin Vore NP for follow up. He continues to have a productive, congested cough. Felt like the prednisone provided minimal relief this time. Has been having difficulties with this cough for sometime now. CT chest did not show any worsening to his emphysema or BTX and no concern for malignancy. His shortness of breath is overall stable. Doesn't notice much  wheezing and has never really had problems with allergies. He's on omeprazole and pepcid for GERD and feels as though this is well-controlled. He continues on [place], does feel like this helped some with his breathing. He  continues on flonase nasal spray, is using mucinex Twice daily and flutter valve 2-3 times a day. FeNO elevated 43 ppb with persistent bronchitic type cough- started on singulair and zyrtec for trigger prevention, and treated with prednisone taper and cough control regimen.   Over the past week, he's felt weaker and has been getting dizzy when he stands. Denies any syncopal events or palpitations. Does occasional have lower extremity swelling, which is a chronic problem for him. He does take metoprolol and his blood pressure was noted to be lower than normal today. Advised that he follow up with cardiology to adjust his BP meds.   12/01/2021: Today - follow up Patient presents today for follow-up after being treated for exacerbation of COPD/asthma with bronchitis.  Overall he feels like he has improved significantly.  He did have a rough few weeks with sinusitis and conjunctivitis treated by his PCP with amoxicillin.  Feels like he is finally starting to recover from this.  From a pulmonary standpoint, breathing has been stable and he feels like the Judithann Sauger has definitely helped him.  Cough has mostly resolved.  He denies any wheezing, hemoptysis, weight loss, anorexia, lower extremity swelling.  He continues on Singulair, Zyrtec and Flonase with good control.  He did go see cardiology after our last visit who lowered his metoprolol and he has not had any dizziness since.  Allergies  Allergen Reactions   Bupropion Other (See Comments)    Caused altered mental status Altered mental status Caused altered mental status   Ciprofloxacin-Dexamethasone Other (See Comments)    Burning in ears when Cipro ear drops were used unknown Burning in ears when Cipro ear drops were used   Gabapentin Other (See Comments)    Caused weakness, dizziness, and forgetfulness  Weakness and dizziness with higher doses. Weakness and dizziness Caused weakness, dizziness, and forgetfulness   Ciprofloxacin Hcl Other (See  Comments)    Burning in ears when drops were used   Clonidine Hcl Other (See Comments)    Immunization History  Administered Date(s) Administered   H1N1 04/25/2008   Influenza Split 04/25/2008, 04/08/2009, 04/03/2010, 04/02/2011, 04/18/2012, 04/27/2013, 04/05/2015, 03/30/2019   Influenza, High Dose Seasonal PF 03/30/2016   Influenza,inj,Quad PF,6+ Mos 04/02/2016, 03/03/2017, 04/28/2018, 04/04/2019, 03/20/2020   Influenza,inj,quad, With Preservative 04/15/2015   Influenza-Unspecified 03/26/2021   PFIZER(Purple Top)SARS-COV-2 Vaccination 08/12/2019, 09/05/2019, 04/15/2020, 10/13/2020   Pfizer Covid-19 Vaccine Bivalent Booster 37yr & up 03/17/2021   Pneumococcal Conjugate-13 05/08/2015   Pneumococcal Polysaccharide-23 10/24/2007, 03/03/2017   Td 03/26/2004   Tdap 03/13/2013   Zoster, Live 03/13/2013, 05/25/2021    Past Medical History:  Diagnosis Date   Arthritis    Bronchitis    history of   COPD with asthma ([place] 12/01/2021   Dysrhythmia    ":Due to MVP"   GERD (gastroesophageal reflux disease)    Hiatal hernia    Hip joint replacement by other means 2004   History of echocardiogram    Echo 11/2019: EF 65-70, no R WMA, mild concentric LVH, normal RV SF, normal PASP (RVSP 19.2 mmHg), trivial MR   Hypercalcemia    Lipoma    left forearm (3)   Malignant melanoma of skin ([place] 06/23/2020   Mitral valve prolapse    does not see cardiologist for. last stress  test 2003   Pneumonia 2009   Tumor cells, benign    lung, left side   Unstable angina ([place]) 10/28/2017    Tobacco History: Social History   Tobacco Use  Smoking Status Former   Packs/day: 1.50   Years: 24.00   Pack years: 36.00   Types: Cigarettes   Start date: 1966   Quit date: 07/05/1985   Years since quitting: 36.4  Smokeless Tobacco Never   Counseling given: Not Answered   Outpatient Medications Prior to Visit  Medication Sig Dispense Refill   albuterol (PROVENTIL) (2.5 MG/3ML) 0.083% nebulizer solution  Take 3 mLs (2.5 mg total) by nebulization every 6 (six) hours as needed for wheezing or shortness of breath. 75 mL 5   albuterol (VENTOLIN HFA) 108 (90 Base) MCG/ACT inhaler Inhale 1-2 puffs into the lungs every 6 (six) hours as needed for wheezing or shortness of breath. 18 g 2   aspirin 81 MG chewable tablet Chew 81 mg by mouth daily.     benzonatate (TESSALON) 200 MG capsule Take 1 capsule (200 mg total) by mouth 3 (three) times daily as needed for cough. 40 capsule 0   Budeson-Glycopyrrol-Formoterol (BREZTRI AEROSPHERE) 160-9-4.8 MCG/ACT AERO Inhale 2 puffs into the lungs in the morning and at bedtime. 10.7 g 0   celecoxib (CELEBREX) 200 MG capsule 1 capsule with food as needed     famotidine (PEPCID) 20 MG tablet Take 1 tablet (20 mg total) by mouth at bedtime. 30 tablet 3   fluticasone (FLONASE) 50 MCG/ACT nasal spray Place 1 spray into both nostrils daily. 18.2 mL 2   methylphenidate (RITALIN) 10 MG tablet 1 tablet on empty stomach     metoprolol succinate (TOPROL XL) 25 MG 24 hr tablet Take 1 tablet (25 mg total) by mouth daily. 90 tablet 2   mirtazapine (REMERON) 15 MG tablet Take 15 mg by mouth at bedtime.     montelukast (SINGULAIR) 10 MG tablet Take 1 tablet (10 mg total) by mouth at bedtime. 30 tablet 5   Multiple Vitamin (MULTIVITAMIN) tablet Take 1 tablet by mouth daily.     omeprazole (PRILOSEC) 40 MG capsule Take 40 mg by mouth every morning.     polyethylene glycol (MIRALAX / GLYCOLAX) packet Take 17 g by mouth daily as needed (for constipation.).      predniSONE (DELTASONE) 10 MG tablet 4 tabs for 3 days, then 3 tabs for 3 days, 2 tabs for 3 days, then 1 tab for 3 days, then stop 20 tablet 0   rosuvastatin (CRESTOR) 10 MG tablet TAKE 1 TABLET(10 MG) BY MOUTH DAILY 90 tablet 3   SYNTHROID 25 MCG tablet Take 25 mcg by mouth daily before breakfast.      tamsulosin (FLOMAX) 0.4 MG CAPS capsule Take 0.4 mg by mouth daily after breakfast.      Tiotropium Bromide-Olodaterol (STIOLTO  RESPIMAT) 2.5-2.5 MCG/ACT AERS Inhale 2 puffs into the lungs daily. (Patient not taking: Reported on 12/01/2021) 4 g 0   No facility-administered medications prior to visit.     Review of Systems:   Constitutional: No weight loss or gain, night sweats, fevers, chills, fatigue, lassitude HEENT: No headaches, difficulty swallowing, tooth/dental problems, or sore throat. No sneezing, itching, ear ache, nasal congestion, or post nasal drip CV:  No chest pain, orthopnea, PND, swelling in lower extremities, anasarca, dizziness, palpitations, syncope Resp: +shortness of breath with exertion (baseline); minimal dry cough (significantly improved).  No hemoptysis.  No wheezing. No chest wall deformity GI:  No heartburn, indigestion, abdominal pain, nausea, vomiting, diarrhea, change in bowel habits, loss of appetite, bloody stools.  Skin: No rash, lesions, ulcerations MSK:  No joint pain or swelling.  No decreased range of motion.  No back pain. Neuro: No dizziness or lightheadedness.  Psych: No depression or anxiety. Mood stable.     Physical Exam:  BP 116/72 (BP Location: Left Arm, Cuff Size: Normal)   Pulse 74   Temp 98.3 F (36.8 C) (Oral)   Ht '5\' 11"'$  (1.803 m)   Wt 178 lb (80.7 kg)   SpO2 98%   BMI 24.83 kg/m   GEN: Pleasant, interactive, well-nourished; elderly; in no acute distress. HEENT:  Normocephalic and atraumatic. PERRLA. Sclera white. Nasal turbinates pink, moist and patent bilaterally. No rhinorrhea present. Oropharynx pink and moist, without exudate or edema. No lesions, ulcerations, or postnasal drip.  NECK:  Supple w/ fair ROM. No JVD present. Normal carotid impulses w/o bruits. Thyroid symmetrical with no goiter or nodules palpated. No lymphadenopathy.   CV: RRR, no m/r/g, no peripheral edema. Pulses intact, +2 bilaterally. No cyanosis, pallor or clubbing. PULMONARY:  Unlabored, regular breathing.  Clear bilaterally A&P without wheezes/rhonchi/rales. No accessory muscle  use. No dullness to percussion. GI: BS present and normoactive. Soft, non-tender to palpation. No organomegaly or masses detected. No CVA tenderness. MSK: No erythema, warmth or tenderness. Cap refil <2 sec all extrem. No deformities or joint swelling noted.  Neuro: A/Ox3. No focal deficits noted.   Skin: Warm, no lesions or rashe Psych: Normal affect and behavior. Judgement and thought content appropriate.     Lab Results:  CBC    Component Value Date/Time   WBC 11.3 (H) 11/17/2021 1159   RBC 4.68 11/17/2021 1159   HGB 15.1 11/17/2021 1159   HGB 15.4 08/17/2019 1517   HCT 44.8 11/17/2021 1159   PLT 161.0 11/17/2021 1159   PLT 112 (L) 08/17/2019 1517   MCV 95.7 11/17/2021 1159   MCH 30.5 08/17/2019 1517   MCHC 33.7 11/17/2021 1159   RDW 12.8 11/17/2021 1159   LYMPHSABS 1.9 11/17/2021 1159   MONOABS 0.7 11/17/2021 1159   EOSABS 0.2 11/17/2021 1159   BASOSABS 0.0 11/17/2021 1159    BMET    Component Value Date/Time   NA 139 11/17/2021 1159   NA 142 02/13/2018 0851   K 4.3 11/17/2021 1159   CL 103 11/17/2021 1159   CO2 28 11/17/2021 1159   GLUCOSE 92 11/17/2021 1159   BUN 22 11/17/2021 1159   BUN 16 02/13/2018 0851   CREATININE 1.15 11/17/2021 1159   CREATININE 1.01 08/17/2019 1517   CALCIUM 8.9 11/17/2021 1159   CALCIUM 9.0 02/19/2011 1233   GFRNONAA >60 08/17/2019 1517   GFRAA >60 08/17/2019 1517    BNP    Component Value Date/Time   BNP 42.2 06/19/2019 0902     Imaging:  DG Chest 2 View  Result Date: 11/03/2021 CLINICAL DATA:  Shortness of breath, cough EXAM: CHEST - 2 VIEW COMPARISON:  06/03/2021 FINDINGS: Airway thickening is present, suggesting bronchitis or reactive airways disease. Heart size within normal limits. Thoracic spondylosis. No airspace opacity. No blunting the costophrenic angle. IMPRESSION: 1. Airway thickening is present, suggesting bronchitis or reactive airways disease. 2. Thoracic spondylosis. Electronically Signed   By: Van Clines M.D.   On: 11/03/2021 12:28   CT Chest W Contrast  Result Date: 11/12/2021 CLINICAL DATA:  Chronic dyspnea, COPD exacerbation EXAM: CT CHEST WITH CONTRAST TECHNIQUE: Multidetector CT imaging of the chest  was performed during intravenous contrast administration. RADIATION DOSE REDUCTION: This exam was performed according to the departmental dose-optimization program which includes automated exposure control, adjustment of the mA and/or kV according to patient size and/or use of iterative reconstruction technique. CONTRAST:  16m OMNIPAQUE IOHEXOL 300 MG/ML  SOLN COMPARISON:  08/15/2020 FINDINGS: Cardiovascular: Aortic atherosclerosis. Normal heart size. Three-vessel coronary artery calcifications. No pericardial effusion. Mediastinum/Nodes: No enlarged mediastinal, hilar, or axillary lymph nodes. Fat containing hiatal hernia. Thyroid gland, trachea, and esophagus demonstrate no significant findings. Lungs/Pleura: Minimal paraseptal emphysema. Unchanged mild, tubular bronchiectasis throughout. Background of very tiny centrilobular pulmonary nodules, most concentrated at the lung apices. More discrete, small pulmonary nodules are stable and definitively benign, for example a 0.5 cm nodule of the peripheral left lower lobe (series 5, image 89). No pleural effusion or pneumothorax. Upper Abdomen: No acute abnormality.  Status post cholecystectomy. Musculoskeletal: No chest wall abnormality. No suspicious osseous lesions identified. IMPRESSION: 1. Minimal paraseptal emphysema. Unchanged mild, tubular bronchiectasis throughout. 2. Background of very tiny centrilobular pulmonary nodules, most concentrated at the lung apices, most commonly seen in smoking-related respiratory bronchiolitis. 3. Additional more discrete, small pulmonary nodules are stable and definitively benign, no further follow-up or characterization required. 4. Coronary artery disease. Aortic Atherosclerosis (ICD10-I70.0) and Emphysema  (ICD10-J43.9). Electronically Signed   By: ADelanna AhmadiM.D.   On: 11/12/2021 08:09    methylPREDNISolone acetate (DEPO-MEDROL) injection 80 mg     Date Action Dose Route User   11/17/2021 1450 Given 80 mg Intramuscular (Left Upper Outer Quadrant) HFran Lowes CMA          Latest Ref Rng & Units 07/31/2021   11:52 AM 08/04/2020    8:53 AM 03/11/2020   10:06 AM 09/25/2019    4:01 PM  PFT Results  FVC-Pre L 2.99   2.95  P 3.17   3.15    FVC-Predicted Pre % 63   61  P 65   65    FVC-Post L 3.10       FVC-Predicted Post % 65       Pre FEV1/FVC % % 84   84  P 83   83    Post FEV1/FCV % % 87       FEV1-Pre L 2.52   2.48  P 2.64   2.63    FEV1-Predicted Pre % 72   70  P 74   73    FEV1-Post L 2.69       DLCO uncorrected ml/min/mmHg 22.06    24.75   21.79    DLCO UNC% % 80    89   78    DLCO corrected ml/min/mmHg 22.06    24.75   21.32    DLCO COR %Predicted % 80    89   76    DLVA Predicted % 126    121   112    TLC L 4.84       TLC % Predicted % 65       RV % Predicted % 68         P Preliminary result    Lab Results  Component Value Date   NITRICOXIDE 14 05/26/2016        Assessment & Plan:   COPD with asthma ([place] Resolved exacerbation.  Appears to have recovered well.  Will continue on triple therapy with Breztri.  He reports that it is expensive, costing around $300 a month.  Provided with [place] paperwork for patient assistance today.  Advised him to return so we can fax for him.  Continue Zyrtec and Singulair for trigger prevention.  These do seem to have helped his cough significantly.  Attend pulmonary rehab as scheduled.  Patient Instructions  Continue Breztri 2 puffs Twice daily. Brush tongue and rinse mouth afterwards. Complete [place] paperwork and return so we can fax it for you. Continue Albuterol inhaler 2 puffs or 3 mL neb every 6 hours as needed for shortness of breath or wheezing. Notify if symptoms persist despite rescue inhaler/neb use. Use neb then  flutter valve Twice daily  Continue flonase nasal spray 1-2 sprays each nostril daily  Continue Mucinex ER Twice daily for chest congestion Continue Flutter valve 2-3 times a day  Continue pepcid 20 mg daily at bedtime Continue omeprazole 40 mg daily for reflux  Continue Singulair 10 mg At bedtime  Continue Zyrtec 10 mg daily    Follow up in 3 months with [name] or Alanson Aly. If symptoms do not improve or worsen, please contact office for sooner follow up or seek emergency care    Bronchiectasis without complication ([place]) Compensated on current regimen.  Continue mucociliary clearance therapies.  Allergic rhinitis Well-controlled on current regimen.  Continue Singulair and Zyrtec for trigger prevention and Flonase for postnasal drainage control.     I spent 28 minutes of dedicated to the care of this patient on the date of this encounter to include pre-visit review of records, face-to-face time with the patient discussing conditions above, post visit ordering of testing, clinical documentation with the electronic health record, making appropriate referrals as documented, and communicating necessary findings to members of the patients care team.  Clayton Bibles, NP 12/01/2021  Pt aware and understands NP's role.

## 2021-12-01 NOTE — Patient Instructions (Addendum)
Continue Breztri 2 puffs Twice daily. Brush tongue and rinse mouth afterwards. Complete [place] paperwork and return so we can fax it for you. Continue Albuterol inhaler 2 puffs or 3 mL neb every 6 hours as needed for shortness of breath or wheezing. Notify if symptoms persist despite rescue inhaler/neb use. Use neb then flutter valve Twice daily  Continue flonase nasal spray 1-2 sprays each nostril daily  Continue Mucinex ER Twice daily for chest congestion Continue Flutter valve 2-3 times a day  Continue pepcid 20 mg daily at bedtime Continue omeprazole 40 mg daily for reflux  Continue Singulair 10 mg At bedtime  Continue Zyrtec 10 mg daily    Follow up in 3 months with [name] or Alanson Aly. If symptoms do not improve or worsen, please contact office for sooner follow up or seek emergency care

## 2021-12-01 NOTE — Assessment & Plan Note (Signed)
Compensated on current regimen.  Continue mucociliary clearance therapies.

## 2021-12-01 NOTE — Progress Notes (Signed)
Daily Session Note  Patient Details  Name: [name] MRN: [id number] Date of Birth: 10-27-1950 Referring Provider:   April Manson Pulmonary Rehab Walk Test from 10/05/2021 in [place]  Referring Provider Ramaswamy       Encounter Date: 12/01/2021  Check In:  Session Check In - 12/01/21 1146       Check-In   Supervising physician immediately available to respond to emergencies [facility] immediately available    Physician(s) Dr. Larence Penning    Location [facility]    Staff Present Rodney Langton, RN;Olinty Celesta Aver, MS, ACSM CEP, Exercise Physiologist;Kaylee Rosana Hoes, MS, ACSM-CEP, Exercise Physiologist;Lynnley Doddridge Leonia Reeves, RN, BSN    Virtual Visit No    Medication changes reported     No    Fall or balance concerns reported    No    Tobacco Cessation No Change    Warm-up and Cool-down Performed as group-led instruction    Resistance Training Performed No    VAD Patient? No    PAD/SET Patient? No      Pain Assessment   Currently in Pain? No/denies    Multiple Pain Sites No             Capillary Blood Glucose: No results found for this or any previous visit (from the past 24 hour(s)).   Exercise Prescription Changes - 12/01/21 1100       Response to Exercise   Blood Pressure (Admit) 108/72    Blood Pressure (Exercise) 124/80    Blood Pressure (Exit) 114/74    Heart Rate (Admit) 78 bpm    Heart Rate (Exercise) 89 bpm    Heart Rate (Exit) 88 bpm    Oxygen Saturation (Admit) 98 %    Oxygen Saturation (Exercise) 99 %    Oxygen Saturation (Exit) 98 %    Rating of Perceived Exertion (Exercise) 12    Perceived Dyspnea (Exercise) 1.5    Duration Continue with 30 min of aerobic exercise without signs/symptoms of physical distress.    Intensity THRR unchanged      Progression   Progression Continue to progress workloads to maintain intensity without signs/symptoms of physical distress.      Resistance Training    Training Prescription Yes    Weight blue bands    Reps 10-15    Time 10 Minutes      NuStep   Level --   left early for a MD appt     Arm Ergometer   Level 2    Minutes 15    METs 2.6             Social History   Tobacco Use  Smoking Status Former   Packs/day: 1.50   Years: 24.00   Pack years: 36.00   Types: Cigarettes   Start date: 1966   Quit date: 07/05/1985   Years since quitting: 36.4  Smokeless Tobacco Never    Goals Met:  Proper associated with RPD/PD & O2 Sat Exercise tolerated well No report of concerns or symptoms today Strength training completed today  Goals Unmet:  Not Applicable  Comments: Service time is from 1022 to [place]    Dr. Rodman Pickle is Medical Director for Pulmonary Rehab at [facility].

## 2021-12-03 ENCOUNTER — Telehealth (HOSPITAL_COMMUNITY): Payer: Self-pay

## 2021-12-03 ENCOUNTER — Encounter (HOSPITAL_COMMUNITY)
Admission: RE | Admit: 2021-12-03 | Discharge: 2021-12-03 | Disposition: A | Discharge status: Home / Self Care | Payer: Medicare Other | Source: Ambulatory Visit | Attending: Internal Medicine | Admitting: Internal Medicine

## 2021-12-03 DIAGNOSIS — J432 Centrilobular emphysema: Secondary | ICD-10-CM | POA: Insufficient documentation

## 2021-12-03 DIAGNOSIS — J849 Interstitial pulmonary disease, unspecified: Secondary | ICD-10-CM | POA: Insufficient documentation

## 2021-12-03 NOTE — Progress Notes (Signed)
Daily Session Note  Patient Details  Name: [name] MRN: [id number] Date of Birth: 07/06/1950 Referring Provider:   Flowsheet Row Pulmonary Rehab Walk Test from 10/05/2021 in [facility]  Referring Provider Ramaswamy       Encounter Date: 12/03/2021  Check In:  Session Check In - 12/03/21 1133       Check-In   Supervising physician immediately available to respond to emergencies [facility] immediately available    Physician(s) Dr. Akula    Location [facility]    Staff Present Joan Behrens, RN, BSN; , RN;Kaylee Davis, MS, ACSM-CEP, Exercise Physiologist    Virtual Visit No    Medication changes reported     No    Fall or balance concerns reported    No    Tobacco Cessation No Change    Warm-up and Cool-down Performed as group-led instruction    Resistance Training Performed Yes    VAD Patient? No    PAD/SET Patient? No      Pain Assessment   Currently in Pain? No/denies    Multiple Pain Sites No             Capillary Blood Glucose: No results found for this or any previous visit (from the past 24 hour(s)).    Social History   Tobacco Use  Smoking Status Former   Packs/day: 1.50   Years: 24.00   Pack years: 36.00   Types: Cigarettes   Start date: 1966   Quit date: 07/05/1985   Years since quitting: 36.4  Smokeless Tobacco Never    Goals Met:  Exercise tolerated well No report of concerns or symptoms today Strength training completed today  Goals Unmet:  Not Applicable  Comments: Service time is from 1017 to 1148    Dr. Jane Ellison is Medical Director for Pulmonary Rehab at [facility].  

## 2021-12-03 NOTE — Telephone Encounter (Signed)
Called [name] to discuss extension. [name] is ok with being extended.

## 2021-12-04 DIAGNOSIS — E063 Autoimmune thyroiditis: Secondary | ICD-10-CM | POA: Diagnosis not present

## 2021-12-04 DIAGNOSIS — J0101 Acute recurrent maxillary sinusitis: Secondary | ICD-10-CM | POA: Diagnosis not present

## 2021-12-04 DIAGNOSIS — D8481 Immunodeficiency due to conditions classified elsewhere: Secondary | ICD-10-CM | POA: Diagnosis not present

## 2021-12-04 DIAGNOSIS — J42 Unspecified chronic bronchitis: Secondary | ICD-10-CM | POA: Diagnosis not present

## 2021-12-04 DIAGNOSIS — Z831 Family history of other infectious and parasitic diseases: Secondary | ICD-10-CM | POA: Diagnosis not present

## 2021-12-04 DIAGNOSIS — Z8269 Family history of other diseases of the musculoskeletal system and connective tissue: Secondary | ICD-10-CM | POA: Diagnosis not present

## 2021-12-07 ENCOUNTER — Ambulatory Visit (INDEPENDENT_AMBULATORY_CARE_PROVIDER_SITE_OTHER): Payer: Medicare Other | Admitting: Podiatry

## 2021-12-07 ENCOUNTER — Ambulatory Visit: Payer: Medicare Other | Admitting: Rheumatology

## 2021-12-07 DIAGNOSIS — Z91198 Patient's noncompliance with other medical treatment and regimen for other reason: Secondary | ICD-10-CM

## 2021-12-08 ENCOUNTER — Encounter (HOSPITAL_COMMUNITY)
Admission: RE | Admit: 2021-12-08 | Discharge: 2021-12-08 | Disposition: A | Discharge status: Home / Self Care | Payer: Medicare Other | Source: Ambulatory Visit | Attending: Internal Medicine | Admitting: Internal Medicine

## 2021-12-08 DIAGNOSIS — J432 Centrilobular emphysema: Secondary | ICD-10-CM

## 2021-12-08 DIAGNOSIS — J849 Interstitial pulmonary disease, unspecified: Secondary | ICD-10-CM | POA: Diagnosis not present

## 2021-12-08 NOTE — Progress Notes (Signed)
Daily Session Note  Patient Details  Name: [name] MRN: [id number] Date of Birth: 09-12-1950 Referring Provider:   April Manson Pulmonary Rehab Walk Test from 10/05/2021 in [place]  Referring Provider Ramaswamy       Encounter Date: 12/08/2021  Check In:  Session Check In - 12/08/21 1155       Check-In   Supervising physician immediately available to respond to emergencies [facility] immediately available    Physician(s) Dr. Karleen Hampshire    Location [facility]    Staff Present Rosebud Poles, RN, Quentin Ore, MS, ACSM-CEP, Exercise Physiologist;Lisa Ysidro Evert, RN    Virtual Visit No    Medication changes reported     No    Fall or balance concerns reported    No    Tobacco Cessation No Change    Warm-up and Cool-down Performed as group-led instruction    Resistance Training Performed Yes    VAD Patient? No    PAD/SET Patient? No      Pain Assessment   Currently in Pain? No/denies    Multiple Pain Sites No             Capillary Blood Glucose: No results found for this or any previous visit (from the past 24 hour(s)).    Social History   Tobacco Use  Smoking Status Former   Packs/day: 1.50   Years: 24.00   Pack years: 36.00   Types: Cigarettes   Start date: 1966   Quit date: 07/05/1985   Years since quitting: 36.4  Smokeless Tobacco Never    Goals Met:  Proper associated with RPD/PD & O2 Sat Exercise tolerated well No report of concerns or symptoms today Strength training completed today  Goals Unmet:  Not Applicable  Comments: Service time is from 1017 to 1136.    Dr. Rodman Pickle is Medical Director for Pulmonary Rehab at [facility].

## 2021-12-10 ENCOUNTER — Encounter (HOSPITAL_COMMUNITY)
Admission: RE | Admit: 2021-12-10 | Discharge: 2021-12-10 | Disposition: A | Discharge status: Home / Self Care | Payer: Medicare Other | Source: Ambulatory Visit | Attending: Internal Medicine | Admitting: Internal Medicine

## 2021-12-10 DIAGNOSIS — J849 Interstitial pulmonary disease, unspecified: Secondary | ICD-10-CM | POA: Diagnosis not present

## 2021-12-10 DIAGNOSIS — J432 Centrilobular emphysema: Secondary | ICD-10-CM

## 2021-12-10 NOTE — Progress Notes (Signed)
Daily Session Note  Patient Details  Name: [name] MRN: [id number] Date of Birth: 02/02/1951 Referring Provider:   April Manson Pulmonary Rehab Walk Test from 10/05/2021 in [place]  Referring Provider Ramaswamy       Encounter Date: 12/10/2021  Check In:  Session Check In - 12/10/21 1128       Check-In   Supervising physician immediately available to respond to emergencies [facility] immediately available    Physician(s) Dr. Cyndia Skeeters    [facility]    Staff Present Rosebud Poles, RN, BSN;Gawain Crombie Ysidro Evert, Cathleen Fears, MS, ACSM-CEP, Exercise Physiologist    Virtual Visit No    Medication changes reported     No    Fall or balance concerns reported    No    Tobacco Cessation No Change    Warm-up and Cool-down Performed as group-led instruction    Resistance Training Performed Yes    VAD Patient? No    PAD/SET Patient? No      Pain Assessment   Currently in Pain? No/denies    Multiple Pain Sites No             Capillary Blood Glucose: No results found for this or any previous visit (from the past 24 hour(s)).    Social History   Tobacco Use  Smoking Status Former   Packs/day: 1.50   Years: 24.00   Total pack years: 36.00   Types: Cigarettes   Start date: 1966   Quit date: 07/05/1985   Years since quitting: 36.4  Smokeless Tobacco Never    Goals Met:  Exercise tolerated well No report of concerns or symptoms today Strength training completed today  Goals Unmet:  Not Applicable  Comments: Service time is from 1015 to [place]    Dr. Rodman Pickle is Medical Director for Pulmonary Rehab at [facility].

## 2021-12-13 NOTE — Progress Notes (Signed)
Power outage in clinic. No charge. 

## 2021-12-15 ENCOUNTER — Encounter (HOSPITAL_COMMUNITY)
Admission: RE | Admit: 2021-12-15 | Discharge: 2021-12-15 | Disposition: A | Discharge status: Home / Self Care | Payer: Medicare Other | Source: Ambulatory Visit | Attending: Internal Medicine | Admitting: Internal Medicine

## 2021-12-15 VITALS — Wt 176.4 lb

## 2021-12-15 DIAGNOSIS — J432 Centrilobular emphysema: Secondary | ICD-10-CM

## 2021-12-15 DIAGNOSIS — J849 Interstitial pulmonary disease, unspecified: Secondary | ICD-10-CM | POA: Diagnosis not present

## 2021-12-15 NOTE — Telephone Encounter (Signed)
Paperwork has been received from up front and filled out. Will have Katie to sign and fax to [employer].

## 2021-12-15 NOTE — Progress Notes (Signed)
Daily Session Note  Patient Details  Name: [name] MRN: [id number] Date of Birth: 10/08/1950 Referring Provider:   Flowsheet Row Pulmonary Rehab Walk Test from 10/05/2021 in [facility]  Referring Provider Ramaswamy       Encounter Date: 12/15/2021  Check In:  Session Check In - 12/15/21 1119       Check-In   Supervising physician immediately available to respond to emergencies [facility] immediately available    Physician(s) Dr. Thompson    [facility]    Staff Present Joan Behrens, RN, BSN; , RN;Kaylee Davis, MS, ACSM-CEP, Exercise Physiologist    Virtual Visit No    Medication changes reported     No    Fall or balance concerns reported    No    Tobacco Cessation No Change    Warm-up and Cool-down Performed as group-led instruction    Resistance Training Performed Yes    VAD Patient? No    PAD/SET Patient? No      Pain Assessment   Currently in Pain? No/denies    Multiple Pain Sites No             Capillary Blood Glucose: No results found for this or any previous visit (from the past 24 hour(s)).   Exercise Prescription Changes - 12/15/21 1200       Response to Exercise   Blood Pressure (Admit) 121/75    Blood Pressure (Exercise) 112/60    Blood Pressure (Exit) 106/60    Heart Rate (Admit) 75 bpm    Heart Rate (Exercise) 107 bpm    Heart Rate (Exit) 89 bpm    Oxygen Saturation (Admit) 98 %    Oxygen Saturation (Exercise) 97 %    Oxygen Saturation (Exit) 97 %    Rating of Perceived Exertion (Exercise) 13    Perceived Dyspnea (Exercise) 1.5    Duration Continue with 30 min of aerobic exercise without signs/symptoms of physical distress.    Intensity THRR unchanged      Progression   Progression Continue to progress workloads to maintain intensity without signs/symptoms of physical distress.      Resistance Training   Training Prescription Yes    Weight Blue bands     Reps 10-15    Time 10 Minutes      NuStep   Level 4    SPM 80    Minutes 15    METs 2.7      Arm Ergometer   Level 2    Minutes 15    METs 2.3             Social History   Tobacco Use  Smoking Status Former   Packs/day: 1.50   Years: 24.00   Total pack years: 36.00   Types: Cigarettes   Start date: 1966   Quit date: 07/05/1985   Years since quitting: 36.4  Smokeless Tobacco Never    Goals Met:  Exercise tolerated well No report of concerns or symptoms today Strength training completed today  Goals Unmet:  Not Applicable  Comments: Service time is from 1019 to 1130    Dr. Jane Ellison is Medical Director for Pulmonary Rehab at [facility].  

## 2021-12-15 NOTE — Telephone Encounter (Signed)
Patient mailed forms back- placed in Katie's box.

## 2021-12-17 ENCOUNTER — Encounter (HOSPITAL_COMMUNITY): Payer: Medicare Other

## 2021-12-18 NOTE — Progress Notes (Signed)
Office Visit Note  Patient: [name]             Date of Birth: 17-Jun-1951           MRN: [id number]             PCP: Jonathon Jordan, MD Referring: Jonathon Jordan, MD Visit Date: 12/24/2021 Occupation: '@GUAROCC'$ @  Subjective:  Neck pain and stiffness  History of Present Illness: [name] is a 71 y.o. male with history of osteoarthritis and degenerative disc disease.  He states he has been having increased neck pain and stiffness.  He also has popping sensation in his neck.  He denies any joint swelling.  He continues to have a stiffness in his joints.  He developed right ring trigger finger few months back which resolved by itself.  His left hip replacement is doing well.  He denies any increased shortness of breath.  He had recent CT scan of the chest.  Activities of Daily Living:  Patient reports morning stiffness for 24 hours.   Patient Reports nocturnal pain.  Difficulty dressing/grooming: Denies Difficulty climbing stairs: Reports Difficulty getting out of chair: Denies Difficulty using hands for taps, buttons, cutlery, and/or writing: Denies  Review of Systems  Constitutional:  Positive for fatigue.  HENT:  Positive for mouth dryness.   Eyes:  Positive for dryness.  Respiratory:  Positive for shortness of breath.   Cardiovascular:  Negative for swelling in legs/feet.  Gastrointestinal:  Positive for constipation.  Endocrine: Positive for cold intolerance, heat intolerance and increased urination.  Genitourinary:  Negative for difficulty urinating.  Musculoskeletal:  Positive for joint pain, gait problem, joint pain, muscle weakness and morning stiffness. Negative for joint swelling.  Skin:  Negative for color change and sensitivity to sunlight.  Allergic/Immunologic: Positive for susceptible to infections.  Neurological:  Positive for numbness and weakness.  Hematological:  Negative for bruising/bleeding tendency.  Psychiatric/Behavioral:  Positive for  sleep disturbance. Negative for depressed mood. The patient is not nervous/anxious.     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   Orthostatic dizziness 11/17/2021   Bilateral lower extremity edema 11/17/2021   Benign neoplasm of stomach 07/13/2021   Acute rhinosinusitis 06/03/2021   Cognitive change 08/05/2020   Acquired deformity of neck 06/23/2020   Allergic rhinitis 06/23/2020   Amnesia 06/23/2020   Cataract 06/23/2020   Cerebrovascular disease 06/23/2020   Closed fracture of one rib 06/23/2020   Coronary artery disease 06/23/2020   Dementia with behavioral disturbance ([place]) 06/23/2020   Dysgeusia 06/23/2020   Extrapyramidal and movement disorder, unspecified 06/23/2020   Gallstones 06/23/2020   Generalized lymphadenopathy 06/23/2020   Hardening of the aorta (main artery of the heart) ([place]) 06/23/2020   Herpes simplex viral infection 06/23/2020   Hypothyroid 06/23/2020   Insomnia 06/23/2020   Malignant melanoma of skin ([place]) 06/23/2020   Mild recurrent major depression ([place]) 06/23/2020   Mitral valve disorder 06/23/2020   Monocytosis 06/23/2020   Neuropathic pain 06/23/2020   Prediabetes 06/23/2020   Renal cyst 06/23/2020   Senile purpura ([place]) 06/23/2020   Solitary pulmonary nodule 06/23/2020   Spinal stenosis in cervical region 06/23/2020   Bronchiectasis without complication ([place]) 25/42/7062   Centrilobular emphysema ([place]) 03/25/2020   Abnormal findings on diagnostic imaging of lung 03/25/2020   Acute exacerbation of COPD with asthma ([place]) 02/13/2020   Abnormal CT of the chest 07/30/2019   Elevated rheumatoid factor 07/30/2019  Oral candidiasis 07/30/2019   Sinus tachycardia 04/03/2019   Chronic otorrhea of left ear 03/28/2019   Confusion 01/02/2019   Bilateral impacted cerumen 01/31/2018   Abnormal EKG-dynamic TWI 10/31/2017   PVC's (premature ventricular contractions) 10/31/2017   Family history  of coronary artery disease in father 10/31/2017   Unstable angina ([place]) 10/28/2017   Bilateral inguinal hernia 11/04/2016   Neck pain 10/14/2016   Mobitz type 1 second degree atrioventricular block 03/16/2016   Syncope 03/15/2016   Acute suppurative otitis media of left ear without spontaneous rupture of tympanic membrane 12/24/2015   Malnutrition of moderate degree 10/21/2015   Hyperkinetic disorder    Choreiform movements 10/20/2015   Bilateral tympanic membrane perforation 09/24/2015   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   Weight loss 09/24/2015   Chronic respiratory failure with hypoxia ([place]) 09/14/2015   Cough    Acute respiratory distress 09/04/2015   Barrett's esophagus 09/04/2015   Dyspnea on exertion 09/04/2015   CAP (community acquired pneumonia) 09/03/2015   Upper airway cough syndrome 08/21/2015   Chest pain with moderate risk of acute coronary syndrome 05/13/2015   Bilateral inguinal hernia (BIH) 01/08/2014   Ileus ([place]) 08/18/2012   Pain due to Left hip joint prosthesis 06/13/2011   Hyperparathyroidism, primary, s/p MI parathyroidectomy 7/18, 3.1 gm adenoma 02/19/2011   LIPOMAS, MULTIPLE 10/24/2007   HLD (hyperlipidemia) 10/24/2007   Depression 10/24/2007   ADD (attention deficit disorder) 10/24/2007   SINUSITIS, CHRONIC 10/24/2007   PNEUMONIA, RECURRENT 10/24/2007   HIP FRACTURE, LEFT 10/24/2007   GENITAL HERPES, HX OF 10/24/2007   Former smoker 10/24/2007   HIP REPLACEMENT, TOTAL, HX OF 10/24/2007   Other acquired absence of organ 10/24/2007   G E R D 07/13/2007    Past Medical History:  Diagnosis Date   Arthritis    Bronchitis    history of   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 last stress test 2003   Pneumonia 2009   Primary immune deficiency disorder ([place])    Tumor cells, benign    lung, left side   Unstable angina ([place]) 10/28/2017    Family History  Problem Relation Age of Onset   Heart Problems Mother    Lung cancer Father 77       smoked   Heart Problems Father    Neuropathy Sister    Cancer Brother        prostate   Lupus Daughter    Healthy Daughter    Multiple sclerosis Son    Past Surgical History:  Procedure Laterality Date   APPENDECTOMY  1984   CATARACT EXTRACTION     L and R eye   CHOLECYSTECTOMY  2002   HYDROCELE EXCISION Right 11/04/2016   Procedure: HYDROCELECTOMY ADULT;  Surgeon: Franchot Gallo, MD;  Location: [facility];  Service: Urology;  Laterality: Right;   INGUINAL HERNIA REPAIR Bilateral 11/04/2016   Procedure: HERNIA REPAIR INGUINAL ADULT BILATERAL;  Surgeon: Jackolyn Confer, MD;  Location:  [facility];  Service: General;  Laterality: Bilateral;   INSERTION OF MESH Bilateral 11/04/2016   Procedure: INSERTION OF MESH;  Surgeon: Jackolyn Confer, MD;  Location: [facility];  Service: General;  Laterality: Bilateral;   JOINT REPLACEMENT     Lt hip   LEFT HEART CATH AND CORONARY ANGIOGRAPHY N/A 10/31/2017   Procedure: LEFT HEART CATH AND CORONARY ANGIOGRAPHY;  Surgeon: Troy Sine, MD;  Location: [facility];  Service: Cardiovascular;  Laterality: N/A;   MASTOIDECTOMY  1996   NASAL SEPTUM SURGERY     sinus surgery   PARATHYROIDECTOMY     TOTAL HIP REVISION  06/14/2011   Procedure: TOTAL HIP REVISION;  Surgeon: Kerin Salen;  Location: [facility];  Service: Orthopedics;  Laterality: Left;  Left Acetabular  Hip Revision   Social History   Social History Narrative   Patient lives at home with his wife Arville Go) 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   Right  handed.   Caffeine - one cup daily.   Immunization History  Administered Date(s) Administered   H1N1 04/25/2008   Influenza Split 04/25/2008, 04/08/2009, 04/03/2010, 04/02/2011, 04/18/2012, 04/27/2013, 04/05/2015, 03/30/2019   Influenza, High Dose Seasonal PF 03/30/2016   Influenza,inj,Quad PF,6+ Mos 04/02/2016, 03/03/2017, 04/28/2018, 04/04/2019, 03/20/2020   Influenza,inj,quad, With Preservative 04/15/2015   Influenza-Unspecified 03/26/2021   PFIZER(Purple Top)SARS-COV-2 Vaccination 08/12/2019, 09/05/2019, 04/15/2020, 10/13/2020   Pfizer Covid-19 Vaccine Bivalent Booster 44yr & up 03/17/2021   Pneumococcal Conjugate-13 05/08/2015   Pneumococcal Polysaccharide-23 10/24/2007, 03/03/2017   Td 03/26/2004   Tdap 03/13/2013   Zoster, Live 03/13/2013, 05/25/2021     Objective: Vital Signs: BP 117/76 (BP Location: Left Arm, Patient Position: Sitting, Cuff Size: Small)   Pulse 84   Resp 12   Ht 5' 11.5" (1.816 m)   Wt 179 lb 3.2 oz (81.3 kg)   BMI 24.64 kg/m    Physical Exam Vitals and nursing note reviewed.  Constitutional:      Appearance: He is well-developed.  HENT:     Head: Normocephalic and atraumatic.  Eyes:     Conjunctiva/sclera: Conjunctivae normal.     Pupils: Pupils are equal, round, and reactive to light.  Cardiovascular:     Rate and Rhythm: Normal rate and regular rhythm.     Heart sounds: Normal heart sounds.  Pulmonary:     Effort: Pulmonary effort is normal.     Breath sounds: Normal breath sounds.  Abdominal:     General: Bowel sounds are normal.     Palpations: Abdomen is soft.  Musculoskeletal:     Cervical back: Normal range of motion and neck supple.  Skin:    General: Skin is warm and dry.     Capillary Refill: Capillary refill takes less than 2 seconds.  Neurological:     Mental Status: He is alert and oriented to person, place, and time.  Psychiatric:        Behavior: Behavior normal.      Musculoskeletal Exam: He had good range of  motion of the cervical spine with a stiffness.  He had significant thoracic kyphosis.  Shoulder joints, elbow joints, wrist joints, MCPs PIPs and DIPs with good range of motion with no synovitis.  Right hip joint was in good range of motion.  Left joint had some limitation which has been replaced.  Knee joints with good range of motion without any warmth swelling or effusion.  There was no tenderness over ankles or MTPs.  CDAI Exam: CDAI Score: --  Patient Global: --; Provider Global: -- Swollen: --; Tender: -- Joint Exam 12/24/2021   No joint exam has been documented for this visit   There is currently no information documented on the homunculus. Go to the Rheumatology activity and complete the homunculus joint exam.  Investigation: No additional findings.  Imaging: No results found.  Recent Labs: Lab Results  Component Value Date   WBC 11.3 (H) 11/17/2021   HGB 15.1 11/17/2021   PLT 161.0 11/17/2021   NA 139 11/17/2021   K 4.3 11/17/2021   CL 103 11/17/2021   CO2 28 11/17/2021   GLUCOSE 92 11/17/2021   BUN 22 11/17/2021   CREATININE 1.15 11/17/2021   BILITOT 0.5 07/18/2020   ALKPHOS 77 07/18/2020   AST 24 07/18/2020   ALT 29 07/18/2020   PROT 6.0 07/18/2020   ALBUMIN 4.1 07/18/2020   CALCIUM 8.9 11/17/2021   GFRAA >60 08/17/2019    IMPRESSION: 1. Minimal paraseptal emphysema. Unchanged mild, tubular bronchiectasis throughout. 2. Background of very tiny centrilobular pulmonary nodules, most concentrated at the lung apices, most commonly seen in smoking-related respiratory bronchiolitis. 3. Additional more discrete, small pulmonary nodules are stable and definitively benign, no further follow-up or characterization required. 4. Coronary artery disease.   Aortic Atherosclerosis (ICD10-I70.0) and Emphysema (ICD10-J43.9).     Electronically Signed   By: Delanna Ahmadi M.D.   On: 11/12/2021 08:09  Speciality Comments: No specialty comments  available.  Procedures:  No procedures performed Allergies: Bupropion, Ciprofloxacin-dexamethasone, Gabapentin, Ciprofloxacin hcl, and Clonidine hcl   Assessment / Plan:     Visit Diagnoses: Rheumatoid factor positive - August 17, 2019 RF 206.6.  He has positive rheumatoid factor but no clinical features of rheumatoid arthritis.  I advised him to contact me if he develops any joint swelling.  He continues to have some joint stiffness.  No synovitis was noted.  Clinical findings are consistent with osteoarthritis.  ILD (interstitial lung disease) ([place]) - ILD changes noted on the previous CT scan resolved on the high-resolution CT obtained in February.  CAD findings noted.  He had recent CT scan of the chest in May 2023.  CT scan findings were reviewed with the patient which were consistent with emphysema, pulmonary nodule and a smoking-related bronchiolitis.  Patient has follow-up visit with [name].  Primary osteoarthritis of both hands - Clinical and radiographic findings are consistent with osteoarthritis.  Joint protection muscle strengthening was discussed.  Patient states he developed a right ring trigger fingers resolved by itself.  History of total left hip arthroplasty-he had limited range of motion without discomfort.  Primary osteoarthritis of both feet - Clinical and radiographic findings are consistent with osteoarthritis.  He denies any discomfort today.  DDD (degenerative disc disease), cervical-he has been experiencing neck pain and stiffness.  He had good range of motion.  C-spine exercises were demonstrated in the office and a handout was given.  I will also refer him to physical therapy.  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  Postural thoracic kyphosis-stretching exercises were demonstrated in the office.  Arthropathy of lumbar facet joint-he denies any discomfort.  Other medical problems are listed as follows:  Former smoker  Barrett's esophagus  without dysplasia  History of gastroesophageal reflux (GERD)  Ileus ([facility])  Mobitz type 1 second degree atrioventricular block  PVC's (premature ventricular contractions)  Hyperparathyroidism, primary, s/p MI parathyroidectomy 7/18, 3.1 gm adenoma  Orders: No orders of the defined types were placed in this encounter.  No orders of  the defined types were placed in this encounter.    Follow-Up Instructions: Return in about 1 year (around 12/25/2022) for Osteoarthritis, +RF.   Bo Merino, MD  Note - This record has been created using Editor, commissioning.  Chart creation errors have been sought, but may not always  have been located. Such creation errors do not reflect on  the standard of medical care.

## 2021-12-20 ENCOUNTER — Encounter: Payer: Self-pay | Admitting: Internal Medicine

## 2021-12-21 NOTE — Telephone Encounter (Signed)
Received a fax from  [place] regarding an approval for  [facility]  patient assistance from 12/17/2021 to 07/04/2022. Approval letter sent to scan center.  Phone number: [phone]

## 2021-12-21 NOTE — Telephone Encounter (Signed)
MR, please see mychart messages from pt and advise.

## 2021-12-22 ENCOUNTER — Encounter (HOSPITAL_COMMUNITY)
Admission: RE | Admit: 2021-12-22 | Discharge: 2021-12-22 | Disposition: A | Discharge status: Home / Self Care | Payer: Medicare Other | Source: Ambulatory Visit | Attending: Internal Medicine | Admitting: Internal Medicine

## 2021-12-22 DIAGNOSIS — J849 Interstitial pulmonary disease, unspecified: Secondary | ICD-10-CM

## 2021-12-22 DIAGNOSIS — J432 Centrilobular emphysema: Secondary | ICD-10-CM

## 2021-12-22 NOTE — Telephone Encounter (Signed)
  I ordered for myasthenia gravis In feb 2023 and this was normal. I do not know what test he is talking about    Latest Reference Range & Units 10/24/07 20:39 09/05/15 12:13 02/26/19 15:23 07/17/19 14:21 08/17/19 15:17 10/08/19 10:11 11/14/20 00:00 08/26/21 15:36  Acetylchol Block Ab 0 - 25 %        17  Acetylcholine Modulat Ab %        CANCELED  AChR Binding Ab, Serum 0.00 - 0.24 nmol/L        <0.03  Anti Nuclear Antibody (ANA) Negative    Negative       Angiotensin-Converting Enzyme 9 - 67 U/L    43      Cyclic Citrullin Peptide Ab UNITS      <16    CCP Antibodies IgG/IgA 0 - 19 units  3        ds DNA Ab 0 - 9 IU/mL  <1        RA Latex Turbid. <14 IU/mL  36.8 (H)  575 (H) 206.6 (H)  372 (H)   IgG (Immunoglobin G), Serum 694 - 1618 mg/dL 653 (L)         IgA 68 - 378 mg/dL 140         Scleroderma (Scl-70) (ENA) Antibody, IgG 0.0 - 0.9 AI  <0.2        (H): Data is abnormally high (L): Data is abnormally low

## 2021-12-22 NOTE — Progress Notes (Signed)
Daily Session Note  Patient Details  Name: TRESON LAURA MRN: [id number] Date of Birth: [birth year] Referring Provider:   April Manson Pulmonary Rehab Walk Test from 10/05/2021 in [place]  Referring Provider Ramaswamy       Encounter Date: 12/22/2021  Check In:  Session Check In - 12/22/21 1114       Check-In   Supervising physician immediately available to respond to emergencies [facility] immediately available    Physician(s) Dr. Karleen Hampshire    Location [facility]    Staff Present Rosebud Poles, RN, BSN;Lisa Ysidro Evert, Cathleen Fears, MS, ACSM-CEP, Exercise Physiologist    Virtual Visit No    Medication changes reported     No    Fall or balance concerns reported    No    Tobacco Cessation No Change    Warm-up and Cool-down Performed as group-led instruction    Resistance Training Performed Yes    VAD Patient? No    PAD/SET Patient? No      Pain Assessment   Currently in Pain? No/denies    Multiple Pain Sites No             Capillary Blood Glucose: No results found for this or any previous visit (from the past 24 hour(s)).    Social History   Tobacco Use  Smoking Status Former   Packs/day: 1.50   Years: 24.00   Total pack years: 36.00   Types: Cigarettes   Start date: 1966   Quit date: 07/05/1985   Years since quitting: 36.4  Smokeless Tobacco Never    Goals Met:  Proper associated with RPD/PD & O2 Sat Independence with exercise equipment Improved SOB with ADL's Exercise tolerated well No report of concerns or symptoms today Strength training completed today  Goals Unmet:  Not Applicable  Comments: Service time is from 1020 to 1135    Dr. Rodman Pickle is Medical Director for Pulmonary Rehab at [facility].

## 2021-12-23 NOTE — Telephone Encounter (Signed)
Dr. Golden Pop response was sent to patient in mychart message it originated in please see that encounter for further updates.

## 2021-12-23 NOTE — Progress Notes (Signed)
Pulmonary Individual Treatment Plan  Patient Details  Name: [name] MRN: [id number] Date of Birth: June 12, 1951 Referring Provider:   April Manson Pulmonary Rehab Walk Test from 10/05/2021 in [place]  Referring Provider Ramaswamy       Initial Encounter Date:  Flowsheet Row Pulmonary Rehab Walk Test from 10/05/2021 in [place]  Date 10/05/21       Visit Diagnosis: Interstitial lung disease ([place])  Centrilobular emphysema ([place])  Patient's Home Medications on Admission:   Current Outpatient Medications:    albuterol (PROVENTIL) (2.5 MG/3ML) 0.083% nebulizer solution, Take 3 mLs (2.5 mg total) by nebulization every 6 (six) hours as needed for wheezing or shortness of breath., Disp: 75 mL, Rfl: 5   albuterol (VENTOLIN HFA) 108 (90 Base) MCG/ACT inhaler, Inhale 1-2 puffs into the lungs every 6 (six) hours as needed for wheezing or shortness of breath., Disp: 18 g, Rfl: 2   aspirin 81 MG chewable tablet, Chew 81 mg by mouth daily., Disp: , Rfl:    benzonatate (TESSALON) 200 MG capsule, Take 1 capsule (200 mg total) by mouth 3 (three) times daily as needed for cough., Disp: 40 capsule, Rfl: 0   Budeson-Glycopyrrol-Formoterol (BREZTRI AEROSPHERE) 160-9-4.8 MCG/ACT AERO, Inhale 2 puffs into the lungs in the morning and at bedtime., Disp: 10.7 g, Rfl: 0   Budeson-Glycopyrrol-Formoterol (BREZTRI AEROSPHERE) 160-9-4.8 MCG/ACT AERO, Inhale 2 puffs into the lungs in the morning and at bedtime., Disp: 10.7 g, Rfl: 0   celecoxib (CELEBREX) 200 MG capsule, 1 capsule with food as needed, Disp: , Rfl:    famotidine (PEPCID) 20 MG tablet, Take 1 tablet (20 mg total) by mouth at bedtime., Disp: 30 tablet, Rfl: 3   fluticasone (FLONASE) 50 MCG/ACT nasal spray, Place 1 spray into both nostrils daily., Disp: 18.2 mL, Rfl: 2   methylphenidate (RITALIN) 10 MG tablet, 1 tablet on empty stomach, Disp: , Rfl:    metoprolol succinate  (TOPROL XL) 25 MG 24 hr tablet, Take 1 tablet (25 mg total) by mouth daily., Disp: 90 tablet, Rfl: 2   mirtazapine (REMERON) 15 MG tablet, Take 15 mg by mouth at bedtime., Disp: , Rfl:    montelukast (SINGULAIR) 10 MG tablet, Take 1 tablet (10 mg total) by mouth at bedtime., Disp: 30 tablet, Rfl: 5   Multiple Vitamin (MULTIVITAMIN) tablet, Take 1 tablet by mouth daily., Disp: , Rfl:    omeprazole (PRILOSEC) 40 MG capsule, Take 40 mg by mouth every morning., Disp: , Rfl:    polyethylene glycol (MIRALAX / GLYCOLAX) packet, Take 17 g by mouth daily as needed (for constipation.). , Disp: , Rfl:    predniSONE (DELTASONE) 10 MG tablet, 4 tabs for 3 days, then 3 tabs for 3 days, 2 tabs for 3 days, then 1 tab for 3 days, then stop, Disp: 20 tablet, Rfl: 0   rosuvastatin (CRESTOR) 10 MG tablet, TAKE 1 TABLET(10 MG) BY MOUTH DAILY, Disp: 90 tablet, Rfl: 3   SYNTHROID 25 MCG tablet, Take 25 mcg by mouth daily before breakfast. , Disp: , Rfl:    tamsulosin (FLOMAX) 0.4 MG CAPS capsule, Take 0.4 mg by mouth daily after breakfast. 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 last stress test 2003   Pneumonia 2009   Tumor cells, benign    lung, left side   Unstable angina ([place]) 10/28/2017    Tobacco Use: Social History   Tobacco Use  Smoking Status Former   Packs/day: 1.50   Years: 24.00   Total pack years: 36.00   Types: Cigarettes   Start date: 1966   Quit date:  07/05/1985   Years since quitting: 36.4  Smokeless Tobacco Never    Labs: Review Flowsheet  More data exists      Latest Ref Rng & Units 10/21/2015 10/28/2017 10/29/2017 02/13/2018  Labs for ITP Cardiac and Pulmonary Rehab  Cholestrol 100 - 199 mg/dL 189  - 128  134   LDL (calc) 0 - 99 mg/dL 124  - 76  66   HDL-C >39 mg/dL 43  - 38  41   Trlycerides 0 - 149 mg/dL 110  - 69  137   Hemoglobin A1c 4.8 - 5.6 % 5.9  5.4  - -  TCO2 22 - 32 mmol/L - - - -      12/29/2018  Labs for ITP Cardiac and Pulmonary Rehab  Cholestrol -  LDL (calc) -  HDL-C -  Trlycerides -  Hemoglobin A1c -  TCO2 30     Capillary Blood Glucose: Lab Results  Component Value Date   GLUCAP 152 (H) 12/29/2018   GLUCAP 136 (H) 03/15/2016   GLUCAP 102 (H) 09/05/2015   GLUCAP 84 09/05/2015   GLUCAP 121 (H) 09/04/2015     Pulmonary Assessment Scores:  Pulmonary Assessment Scores     Row Name 10/05/21 0922 12/10/21 1548       ADL UCSD   ADL Phase Entry --    SOB Score total 37 26      CAT Score   CAT Score 6 13      mMRC Score   mMRC Score 3 --            UCSD: Self-administered rating of dyspnea associated with activities of daily living (ADLs) 6-point scale (0 = "not at all" to 5 = "maximal or unable to do because of breathlessness")  Scoring Scores range from 0 to 120.  Minimally important difference is 5 units  CAT: CAT can identify the health impairment of COPD patients and is better correlated with disease progression.  CAT has a scoring range of zero to 40. The CAT score is classified into four groups of low (less than 10), medium (10 - 20), high (21-30) and very high (31-40) based on the impact level of disease on health status. A CAT score over 10 suggests significant symptoms.  A worsening CAT score could be explained by an exacerbation, poor medication adherence, poor inhaler technique, or progression of COPD or comorbid conditions.  CAT MCID is 2 points  mMRC: mMRC (Modified Medical  Research Council) Dyspnea Scale is used to assess the degree of baseline functional disability in patients of respiratory disease due to dyspnea. No minimal important difference is established. A decrease in score of 1 point or  greater is considered a positive change.   Pulmonary Function Assessment:  Pulmonary Function Assessment - 10/05/21 0911       Breath   Bilateral Breath Sounds Clear    Shortness of Breath Yes;Limiting activity             Exercise Target Goals: Exercise Program Goal: Individual exercise prescription set using results from initial 6 min walk test and THRR while considering  patient's activity barriers and safety.   Exercise Prescription Goal: Initial exercise prescription builds to 30-45 minutes a day of aerobic activity, 2-3 days per week.  Home exercise guidelines will be given to patient during program as part of exercise prescription that the participant will acknowledge.  Activity Barriers & Risk Stratification:  Activity Barriers & Cardiac Risk Stratification - 10/05/21 0912       Activity Barriers & Cardiac Risk Stratification   Activity Barriers Arthritis;Deconditioning;Left Hip Replacement;Shortness of Breath;Muscular Weakness;History of Falls    Cardiac Risk Stratification Moderate             6 Minute Walk:  6 Minute Walk     Row Name 10/05/21 0953         6 Minute Walk   Phase Initial     Distance 1398 feet     Walk Time 6 minutes     # of Rest Breaks 0     MPH 2.65     METS 3.16     RPE 11     Perceived Dyspnea  0.5     VO2 Peak 11.06     Symptoms No     Resting HR 79 bpm     Resting BP 120/66     Resting Oxygen Saturation  98 %     Exercise Oxygen Saturation  during 6 min walk 99 %     Max Ex. HR 100 bpm     Max Ex. BP 124/64     2 Minute Post BP 112/60       Interval HR   1 Minute HR 97     2 Minute HR 100     3 Minute HR 100     4 Minute HR 100     5 Minute HR 100     6 Minute HR 99     2 Minute Post HR 82      Interval Heart Rate? Yes       Interval Oxygen   Interval Oxygen? Yes     Baseline Oxygen Saturation % 98 %     1 Minute Oxygen Saturation % 100 %     1 Minute Liters of Oxygen 0 L     2 Minute Oxygen Saturation % 99 %     2 Minute Liters of Oxygen 0 L     3 Minute Oxygen Saturation % 99 %     3 Minute Liters of Oxygen 0 L     4 Minute Oxygen Saturation % 99 %     4 Minute Liters of Oxygen 0 L     5 Minute Oxygen Saturation % 99 %     5 Minute Liters of Oxygen 0 L     6 Minute Oxygen Saturation % 99 %     6 Minute Liters of Oxygen 0 L     2 Minute Post Oxygen Saturation % 100 %     2 Minute Post Liters of Oxygen 0 L  Oxygen Initial Assessment:  Oxygen Initial Assessment - 10/05/21 0909       Home Oxygen   Home Oxygen Device None    Sleep Oxygen Prescription None    Home Exercise Oxygen Prescription None    Home Resting Oxygen Prescription None    Compliance with Home Oxygen Use Yes      Initial 6 min Walk   Oxygen Used None      Program Oxygen Prescription   Program Oxygen Prescription None      Intervention   Short Term Goals To learn and exhibit compliance with exercise, home and travel O2 prescription;To learn and understand importance of maintaining oxygen saturations>88%;To learn and demonstrate proper use of respiratory medications;To learn and understand importance of monitoring SPO2 with pulse oximeter and demonstrate accurate use of the pulse oximeter.;To learn and demonstrate proper pursed lip breathing techniques or other breathing techniques. ;Other    Long  Term Goals Exhibits compliance with exercise, home  and travel O2 prescription;Verbalizes importance of monitoring SPO2 with pulse oximeter and return demonstration;Maintenance of O2 saturations>88%;Exhibits proper breathing techniques, such as pursed lip breathing or other method taught during program session;Compliance with respiratory medication;Other             Oxygen  Re-Evaluation:  Oxygen Re-Evaluation     Row Name 10/21/21 1107 11/16/21 1156 12/15/21 0929         Program Oxygen Prescription   Program Oxygen Prescription None None None       Home Oxygen   Home Oxygen Device None None None     Sleep Oxygen Prescription None None None     Home Exercise Oxygen Prescription None None None     Home Resting Oxygen Prescription None None None     Compliance with Home Oxygen Use Yes Yes Yes       Goals/Expected Outcomes   Short Term Goals To learn and exhibit compliance with exercise, home and travel O2 prescription;To learn and understand importance of maintaining oxygen saturations>88%;To learn and demonstrate proper use of respiratory medications;To learn and understand importance of monitoring SPO2 with pulse oximeter and demonstrate accurate use of the pulse oximeter.;To learn and demonstrate proper pursed lip breathing techniques or other breathing techniques. ;Other To learn and exhibit compliance with exercise, home and travel O2 prescription;To learn and understand importance of maintaining oxygen saturations>88%;To learn and demonstrate proper use of respiratory medications;To learn and understand importance of monitoring SPO2 with pulse oximeter and demonstrate accurate use of the pulse oximeter.;To learn and demonstrate proper pursed lip breathing techniques or other breathing techniques. ;Other To learn and exhibit compliance with exercise, home and travel O2 prescription;To learn and understand importance of maintaining oxygen saturations>88%;To learn and demonstrate proper use of respiratory medications;To learn and understand importance of monitoring SPO2 with pulse oximeter and demonstrate accurate use of the pulse oximeter.;To learn and demonstrate proper pursed lip breathing techniques or other breathing techniques. ;Other     Long  Term Goals Exhibits compliance with exercise, home  and travel O2 prescription;Verbalizes importance of monitoring  SPO2 with pulse oximeter and return demonstration;Maintenance of O2 saturations>88%;Exhibits proper breathing techniques, such as pursed lip breathing or other method taught during program session;Compliance with respiratory medication;Other Exhibits compliance with exercise, home  and travel O2 prescription;Verbalizes importance of monitoring SPO2 with pulse oximeter and return demonstration;Maintenance of O2 saturations>88%;Exhibits proper breathing techniques, such as pursed lip breathing or other method taught during program session;Compliance with respiratory medication;Other Exhibits compliance with exercise, home  and travel  O2 prescription;Verbalizes importance of monitoring SPO2 with pulse oximeter and return demonstration;Maintenance of O2 saturations>88%;Exhibits proper breathing techniques, such as pursed lip breathing or other method taught during program session;Compliance with respiratory medication;Other     Goals/Expected Outcomes Compliance and understanding of oxygen saturation monitoring and breathing techniques to decrease shortness of breath. Compliance and understanding of oxygen saturation monitoring and breathing techniques to decrease shortness of breath. Compliance and understanding of oxygen saturation monitoring and breathing techniques to decrease shortness of breath.              Oxygen Discharge (Final Oxygen Re-Evaluation):  Oxygen Re-Evaluation - 12/15/21 0929       Program Oxygen Prescription   Program Oxygen Prescription None      Home Oxygen   Home Oxygen Device None    Sleep Oxygen Prescription None    Home Exercise Oxygen Prescription None    Home Resting Oxygen Prescription None    Compliance with Home Oxygen Use Yes      Goals/Expected Outcomes   Short Term Goals To learn and exhibit compliance with exercise, home and travel O2 prescription;To learn and understand importance of maintaining oxygen saturations>88%;To learn and demonstrate proper use of  respiratory medications;To learn and understand importance of monitoring SPO2 with pulse oximeter and demonstrate accurate use of the pulse oximeter.;To learn and demonstrate proper pursed lip breathing techniques or other breathing techniques. ;Other    Long  Term Goals Exhibits compliance with exercise, home  and travel O2 prescription;Verbalizes importance of monitoring SPO2 with pulse oximeter and return demonstration;Maintenance of O2 saturations>88%;Exhibits proper breathing techniques, such as pursed lip breathing or other method taught during program session;Compliance with respiratory medication;Other    Goals/Expected Outcomes Compliance and understanding of oxygen saturation monitoring and breathing techniques to decrease shortness of breath.             Initial Exercise Prescription:  Initial Exercise Prescription - 10/05/21 0900       Date of Initial Exercise RX and Referring Provider   Date 10/05/21    Referring Provider Ramaswamy    Expected Discharge Date 12/10/21      NuStep   Level 1    SPM 80    Minutes 15      Arm Ergometer   Level 1    Watts 4    Minutes 15      Prescription Details   Frequency (times per week) 2    Duration Progress to 30 minutes of continuous aerobic without signs/symptoms of physical distress      Intensity   THRR 40-80% of Max Heartrate 60-119    Ratings of Perceived Exertion 11-13    Perceived Dyspnea 0-4      Progression   Progression Continue to progress workloads to maintain intensity without signs/symptoms of physical distress.      Resistance Training   Training Prescription Yes    Weight blue bands    Reps 10-15             Perform Capillary Blood Glucose checks as needed.  Exercise Prescription Changes:   Exercise Prescription Changes     Row Name 10/20/21 1200 10/29/21 1543 11/17/21 1200 12/01/21 1100 12/15/21 1200     Response to Exercise   Blood Pressure (Admit) 96/58 102/58 108/62 108/72 121/75   Blood  Pressure (Exercise) 110/70 -- -- 124/80 112/60   Blood Pressure (Exit) 100/58 112/64 104/66 114/74 106/60   Heart Rate (Admit) 81 bpm 72 bpm 76 bpm 78 bpm 75  bpm   Heart Rate (Exercise) 101 bpm 88 bpm 94 bpm 89 bpm 107 bpm   Heart Rate (Exit) 92 bpm 89 bpm 77 bpm 88 bpm 89 bpm   Oxygen Saturation (Admit) 98 % 96 % 98 % 98 % 98 %   Oxygen Saturation (Exercise) 94 % 97 % 97 % 99 % 97 %   Oxygen Saturation (Exit) 95 % 97 % 96 % 98 % 97 %   Rating of Perceived Exertion (Exercise) _0 Perceived Dyspnea (Exercise) _1 1.5 1.5   Duration Progress to 30 minutes of  aerobic without signs/symptoms of physical distress Continue with 30 min of aerobic exercise without signs/symptoms of physical distress. Continue with 30 min of aerobic exercise without signs/symptoms of physical distress. Continue with 30 min of aerobic exercise without signs/symptoms of physical distress. Continue with 30 min of aerobic exercise without signs/symptoms of physical distress.   Intensity Other (comment)  40-80% of HRR THRR unchanged THRR unchanged THRR unchanged THRR unchanged     Progression   Progression Continue to progress workloads to maintain intensity without signs/symptoms of physical distress. Continue to progress workloads to maintain intensity without signs/symptoms of physical distress. Continue to progress workloads to maintain intensity without signs/symptoms of physical distress. Continue to progress workloads to maintain intensity without signs/symptoms of physical distress. Continue to progress workloads to maintain intensity without signs/symptoms of physical distress.     Resistance Training   Training Prescription _2    Weight _3    Reps 10-15 10-15 10-15 10-15 10-15   Time 10 Minutes 10 Minutes 10 Minutes 10 Minutes 10 Minutes     NuStep   Level _4 --  left early for a MD appt 4   SPM 80 80 80 -- 80   Minutes _5 -- 15   METs 2.1 2.7 2.7 -- 2.7     Arm Ergometer   Level -- 1.5 1._6 Minutes -- _7 METs -- 2.2 2.1 2.6 2.3     Arm/Foot Ergometer   Level 1.5 -- -- -- --   Watts 21 -- -- -- --   Minutes 15 -- -- -- --            Exercise Comments:   Exercise Comments     Row Name 10/13/21 1224 11/10/21 1527         Exercise Comments Pt completed first day of exercise. Bob exercised for 15 min on the arm ergometer and Nustep. He averaged about 1.5 METs at level 1 on the arm ergometer and 2.6 METs at level 2 on the Nustep. He performed the warmup and cooldown standing without limitations. Completed home exercise plan. [name] is exercising at home inconsistently. He walks 1 non-rehab day/wk for 45-50 min/day. He stated that his plan is to walk 4 days/wk. I agreed with this plan. I encouraged him to walk for 30-45 min/day. He agreed with my recommendations. [name] is motivated to exercise and improve his functional capacity. He understands that he needs to get back to exercising around home. He wants to use his [employer] program to join a local fitness center. I encouraged him to do so and offered exercise class recommendations.               Exercise Goals and Review:   Exercise Goals  [place] Name 10/05/21 0912 10/21/21 1104 11/16/21 1154 12/15/21 0929       Exercise Goals   Increase Physical Activity Yes Yes Yes Yes    Intervention Provide advice, education, support and counseling about physical activity/exercise needs.;Develop an individualized exercise prescription for aerobic and resistive training based on initial evaluation findings, risk stratification, comorbidities and participant's personal goals. Provide advice, education, support and counseling about physical activity/exercise needs.;Develop an individualized exercise prescription for aerobic and resistive training based on initial evaluation findings, risk stratification, comorbidities and participant's personal  goals. Provide advice, education, support and counseling about physical activity/exercise needs.;Develop an individualized exercise prescription for aerobic and resistive training based on initial evaluation findings, risk stratification, comorbidities and participant's personal goals. Provide advice, education, support and counseling about physical activity/exercise needs.;Develop an individualized exercise prescription for aerobic and resistive training based on initial evaluation findings, risk stratification, comorbidities and participant's personal goals.    Expected Outcomes Short Term: Attend rehab on a regular basis to increase amount of physical activity.;Long Term: Add in home exercise to make exercise part of routine and to increase amount of physical activity.;Long Term: Exercising regularly at least 3-5 days a week. Short Term: Attend rehab on a regular basis to increase amount of physical activity.;Long Term: Add in home exercise to make exercise part of routine and to increase amount of physical activity.;Long Term: Exercising regularly at least 3-5 days a week. Short Term: Attend rehab on a regular basis to increase amount of physical activity.;Long Term: Add in home exercise to make exercise part of routine and to increase amount of physical activity.;Long Term: Exercising regularly at least 3-5 days a week. Short Term: Attend rehab on a regular basis to increase amount of physical activity.;Long Term: Add in home exercise to make exercise part of routine and to increase amount of physical activity.;Long Term: Exercising regularly at least 3-5 days a week.    Increase Strength and Stamina Yes Yes Yes Yes    Intervention Provide advice, education, support and counseling about physical activity/exercise needs.;Develop an individualized exercise prescription for aerobic and resistive training based on initial evaluation findings, risk stratification, comorbidities and participant's personal goals.  Provide advice, education, support and counseling about physical activity/exercise needs.;Develop an individualized exercise prescription for aerobic and resistive training based on initial evaluation findings, risk stratification, comorbidities and participant's personal goals. Provide advice, education, support and counseling about physical activity/exercise needs.;Develop an individualized exercise prescription for aerobic and resistive training based on initial evaluation findings, risk stratification, comorbidities and participant's personal goals. Provide advice, education, support and counseling about physical activity/exercise needs.;Develop an individualized exercise prescription for aerobic and resistive training based on initial evaluation findings, risk stratification, comorbidities and participant's personal goals.    Expected Outcomes Short Term: Increase workloads from initial exercise prescription for resistance, speed, and METs.;Short Term: Perform resistance training exercises routinely during rehab and add in resistance training at home;Long Term: Improve cardiorespiratory fitness, muscular endurance and strength as measured by increased METs and functional capacity (6MWT) Short Term: Increase workloads from initial exercise prescription for resistance, speed, and METs.;Short Term: Perform resistance training exercises routinely during rehab and add in resistance training at home;Long Term: Improve cardiorespiratory fitness, muscular endurance and strength as measured by increased METs and functional capacity (6MWT) Short Term: Increase workloads from initial exercise prescription for resistance, speed, and METs.;Short Term: Perform resistance training exercises routinely during rehab and add in resistance training at home;Long Term: Improve cardiorespiratory fitness, muscular endurance and strength as measured by increased  METs and functional capacity (6MWT) Short Term: Increase workloads from  initial exercise prescription for resistance, speed, and METs.;Short Term: Perform resistance training exercises routinely during rehab and add in resistance training at home;Long Term: Improve cardiorespiratory fitness, muscular endurance and strength as measured by increased METs and functional capacity (6MWT)    Able to understand and use rate of perceived exertion (RPE) scale Yes Yes Yes Yes    Intervention Provide education and explanation on how to use RPE scale Provide education and explanation on how to use RPE scale Provide education and explanation on how to use RPE scale Provide education and explanation on how to use RPE scale    Expected Outcomes Short Term: Able to use RPE daily in rehab to express subjective intensity level;Long Term:  Able to use RPE to guide intensity level when exercising independently Short Term: Able to use RPE daily in rehab to express subjective intensity level;Long Term:  Able to use RPE to guide intensity level when exercising independently Short Term: Able to use RPE daily in rehab to express subjective intensity level;Long Term:  Able to use RPE to guide intensity level when exercising independently Short Term: Able to use RPE daily in rehab to express subjective intensity level;Long Term:  Able to use RPE to guide intensity level when exercising independently    Able to understand and use Dyspnea scale Yes Yes Yes Yes    Intervention Provide education and explanation on how to use Dyspnea scale Provide education and explanation on how to use Dyspnea scale Provide education and explanation on how to use Dyspnea scale Provide education and explanation on how to use Dyspnea scale    Expected Outcomes Short Term: Able to use Dyspnea scale daily in rehab to express subjective sense of shortness of breath during exertion;Long Term: Able to use Dyspnea scale to guide intensity level when exercising independently Short Term: Able to use Dyspnea scale daily in rehab to  express subjective sense of shortness of breath during exertion;Long Term: Able to use Dyspnea scale to guide intensity level when exercising independently Short Term: Able to use Dyspnea scale daily in rehab to express subjective sense of shortness of breath during exertion;Long Term: Able to use Dyspnea scale to guide intensity level when exercising independently Short Term: Able to use Dyspnea scale daily in rehab to express subjective sense of shortness of breath during exertion;Long Term: Able to use Dyspnea scale to guide intensity level when exercising independently    Knowledge and understanding of Target Heart Rate Range (THRR) Yes Yes Yes Yes    Intervention Provide education and explanation of THRR including how the numbers were predicted and where they are located for reference Provide education and explanation of THRR including how the numbers were predicted and where they are located for reference Provide education and explanation of THRR including how the numbers were predicted and where they are located for reference Provide education and explanation of THRR including how the numbers were predicted and where they are located for reference    Expected Outcomes Short Term: Able to state/look up THRR;Long Term: Able to use THRR to govern intensity when exercising independently;Short Term: Able to use daily as guideline for intensity in rehab Short Term: Able to state/look up THRR;Long Term: Able to use THRR to govern intensity when exercising independently;Short Term: Able to use daily as guideline for intensity in rehab Short Term: Able to state/look up THRR;Long Term: Able to use THRR to govern intensity when exercising independently;Short Term:  Able to use daily as guideline for intensity in rehab Short Term: Able to state/look up THRR;Long Term: Able to use THRR to govern intensity when exercising independently;Short Term: Able to use daily as guideline for intensity in rehab    Understanding of  Exercise Prescription Yes Yes Yes Yes    Intervention Provide education, explanation, and written materials on patient's individual exercise prescription Provide education, explanation, and written materials on patient's individual exercise prescription Provide education, explanation, and written materials on patient's individual exercise prescription Provide education, explanation, and written materials on patient's individual exercise prescription    Expected Outcomes Short Term: Able to explain program exercise prescription;Long Term: Able to explain home exercise prescription to exercise independently Short Term: Able to explain program exercise prescription;Long Term: Able to explain home exercise prescription to exercise independently Short Term: Able to explain program exercise prescription;Long Term: Able to explain home exercise prescription to exercise independently Short Term: Able to explain program exercise prescription;Long Term: Able to explain home exercise prescription to exercise independently             Exercise Goals Re-Evaluation :  Exercise Goals Re-Evaluation     Row Name 10/21/21 1104 11/16/21 1154 12/15/21 0930         Exercise Goal Re-Evaluation   Exercise Goals Review Increase Physical Activity;Increase Strength and Stamina;Able to understand and use rate of perceived exertion (RPE) scale;Able to understand and use Dyspnea scale;Knowledge and understanding of Target Heart Rate Range (THRR);Understanding of Exercise Prescription Increase Physical Activity;Increase Strength and Stamina;Able to understand and use rate of perceived exertion (RPE) scale;Able to understand and use Dyspnea scale;Knowledge and understanding of Target Heart Rate Range (THRR);Understanding of Exercise Prescription Increase Physical Activity;Increase Strength and Stamina;Able to understand and use rate of perceived exertion (RPE) scale;Able to understand and use Dyspnea scale;Knowledge and  understanding of Target Heart Rate Range (THRR);Understanding of Exercise Prescription     Comments [name] has completed 3 exercise sessions. He exercises for 15 min on the arm ergometer and Nustep. He averages 2.1 METs at level 1.5 on the arm erg and 2.1 METs at level 2 on the Nustep. He increased his level on the Nustep on the first day of exercise. He has tolerated level 2 well. He also increased his level on the arm erg recently as he tolerated this well. [name] is motivated to exercise and improve his functional capacity. He seems to enjoy rehab. Will continue to monitor and progress as able. [name] has completed 8 exercise sessions. He exercises for 15 min on the arm ergometer and Nustep. He averages 2.1 METs at level 1.5 on the arm erg and 2.7 METs at level 3 on the Nustep. He increased his level on the Nustep and tolerates this well. He has not increased his level on the arm ergometer. Will consider increasing. [name] is motivated to exercise and improve his functional capacity. He exercises at home consistently. Will continue to monitor and progress as able. [name] has completed 13 exercise sessions. He exercises for 15 min on the arm ergometer and Nustep. He averages 2.1 METs at level 2 on the arm erg and 2.5 METs at level 4 on the Nustep. He increased his level for both exercise modes, although METs have remained the same. [name] is motivated to exercise and improve his functional capacity. He exercises at home consistently. Will continue to monitor and progress as able.     Expected Outcomes Through exercise at rehab and at home, the patient will decrease shortness of  breath with daily activities and feel confident in carrying out an exercise regime at home. Through exercise at rehab and at home, the patient will decrease shortness of breath with daily activities and feel confident in carrying out an exercise regime at home. Through exercise at rehab and at home, the patient will decrease shortness of breath with daily  activities and feel confident in carrying out an exercise regime at home.              Discharge Exercise Prescription (Final Exercise Prescription Changes):  Exercise Prescription Changes - 12/15/21 1200       Response to Exercise   Blood Pressure (Admit) 121/75    Blood Pressure (Exercise) 112/60    Blood Pressure (Exit) 106/60    Heart Rate (Admit) 75 bpm    Heart Rate (Exercise) 107 bpm    Heart Rate (Exit) 89 bpm    Oxygen Saturation (Admit) 98 %    Oxygen Saturation (Exercise) 97 %    Oxygen Saturation (Exit) 97 %    Rating of Perceived Exertion (Exercise) 13    Perceived Dyspnea (Exercise) 1.5    Duration Continue with 30 min of aerobic exercise without signs/symptoms of physical distress.    Intensity THRR unchanged      Progression   Progression Continue to progress workloads to maintain intensity without signs/symptoms of physical distress.      Resistance Training   Training Prescription Yes    Weight Blue bands    Reps 10-15    Time 10 Minutes      NuStep   Level 4    SPM 80    Minutes 15    METs 2.7      Arm Ergometer   Level 2    Minutes 15    METs 2.3             Nutrition:  Target Goals: Understanding of nutrition guidelines, daily intake of sodium <1551m, cholesterol <2076m calories 30% from fat and 7% or less from saturated fats, daily to have 5 or more servings of fruits and vegetables.  Biometrics:    Nutrition Therapy Plan and Nutrition Goals:  Nutrition Therapy & Goals - 12/22/21 1152       Nutrition Therapy   Diet Heart Healthy Diet    Drug/Food Interactions Statins/Certain Fruits      Personal Nutrition Goals   Nutrition Goal Patient to identify and benefit of including fruits, vegetables, whole grains, low fat dairy, lean protein/plant protein as part of heart healthy meal plan.   in progress   Personal Goal #2 Patient to identify strategies for weight maintenance/weight gain during periods of COPD exacerbation/low PO  intake.   in progress   Comments Goals in progress. He is down 4.4# of unintentional weight loss since starting with our program; however, he is up ~1# over the last week. He reports good knowledge of strategies for weight gain and good appetitie. His family remains extremely supportive.      Intervention Plan   Intervention Prescribe, educate and counsel regarding individualized specific dietary modifications aiming towards targeted core components such as weight, hypertension, lipid management, diabetes, heart failure and other comorbidities.    Expected Outcomes Short Term Goal: Understand basic principles of dietary content, such as calories, fat, sodium, cholesterol and nutrients.;Long Term Goal: Adherence to prescribed nutrition plan.             Nutrition Assessments:  MEDIFICTS Score Key: ?70 Need to make dietary changes  40-70  Heart Healthy Diet ? 40 Therapeutic Level Cholesterol Diet   Picture Your Plate Scores: <03 Unhealthy dietary pattern with much room for improvement. 41-50 Dietary pattern unlikely to meet recommendations for good health and room for improvement. 51-60 More healthful dietary pattern, with some room for improvement.  >60 Healthy dietary pattern, although there may be some specific behaviors that could be improved.    Nutrition Goals Re-Evaluation:  Nutrition Goals Re-Evaluation     [place] Name 12/15/21 1102 12/22/21 1152           Goals   Current Weight 176 lb 5.9 oz (80 kg) 178 lb 5.6 oz (80.9 kg)      Nutrition Goal Patient to identify and benefit of including fruits, vegetables, whole grains, low fat dairy, lean protein/plant protein as part of heart healthy meal plan.  in progress --      Expected Outcome Goals in progress. Patient is down ~6.4# since starting with program. Reviewed goals for weight gain/weight maintenance; recommended increasing to two nutrition supplements per day. Goals in progress. He is down 4.4# of unintentional weight loss  since starting with our program; however, he is up ~2# over the last week. He reports good knowledge of strategies for weight gain and good appetitie. His family remains extremely supportive.        Personal Goal #2 Re-Evaluation   Personal Goal #2 Patient to identify strategies for weight maintenance/weight gain during periods of COPD exacerbation/low PO intake.  in progress --               Nutrition Goals Discharge (Final Nutrition Goals Re-Evaluation):  Nutrition Goals Re-Evaluation - 12/22/21 1152       Goals   Current Weight 178 lb 5.6 oz (80.9 kg)    Expected Outcome Goals in progress. He is down 4.4# of unintentional weight loss since starting with our program; however, he is up ~2# over the last week. He reports good knowledge of strategies for weight gain and good appetitie. His family remains extremely supportive.             Psychosocial: Target Goals: Acknowledge presence or absence of significant depression and/or stress, maximize coping skills, provide positive support system. Participant is able to verbalize types and ability to use techniques and skills needed for reducing stress and depression.  Initial Review & Psychosocial Screening:  Initial Psych Review & Screening - 10/05/21 0906       Initial Review   Current issues with History of Depression      Family Dynamics   Good Support System? Yes    Comments [name]      Barriers   Psychosocial barriers to participate in program There are no identifiable barriers or psychosocial needs.      Screening Interventions   Interventions Encouraged to exercise             Quality of Life Scores:  Scores of 19 and below usually indicate a poorer quality of life in these areas.  A difference of  2-3 points is a clinically meaningful difference.  A difference of 2-3 points in the total score of the Quality of Life Index has been associated with significant improvement in overall quality of life, self-image,  physical symptoms, and general health in studies assessing change in quality of life.  PHQ-9: Review Flowsheet       10/05/2021 12/12/2019  Depression screen PHQ 2/9  Decreased Interest 0 0  Down, Depressed, Hopeless 0 0  PHQ - 2  Score 0 0  Altered sleeping 0 3  Tired, decreased energy 1 3  Change in appetite 0 1  Feeling bad or failure about yourself  0 0  Trouble concentrating 0 0  Moving slowly or fidgety/restless 0 0  Suicidal thoughts 0 0  PHQ-9 Score 1 -  Difficult doing work/chores Not difficult at all Not difficult at all   Interpretation of Total Score  Total Score Depression Severity:  1-4 = Minimal depression, 5-9 = Mild depression, 10-14 = Moderate depression, 15-19 = Moderately severe depression, 20-27 = Severe depression   Psychosocial Evaluation and Intervention:   Psychosocial Re-Evaluation:  Psychosocial Re-Evaluation     [place] Name 10/22/21 0741 11/24/21 0950 12/16/21 1400         Psychosocial Re-Evaluation   Current issues with History of Depression History of Depression History of Depression;None Identified     Comments [name] is participating in the PR porgam without any psychosocial barriers thus far. He continues on medications for depression so this has not been an issues for him since starting class. [name] has been participating in the [facility] program without any psychosocial barriers. He has having issues wtih his SOB and low BP. He has had several absences and we have discussed him dropping from the program until his medical condition imporves. Last week he decided that he wanted to try to continue, but he reports this morning more of the same issues and not able to attend. I will reach out to him today for futher disscussion. [name] has been participating in the [facility] program with no psychosocial barriers. He is on medication for his depression and this does not seem to be an issue at present. He has had a lot of illness lately with pulmonary excerbations but currently he  is feeling much better.     Expected Outcomes That [name] will continue to be able to participate in the PR program without any psychosocial issues or concerns. For [name] to attend the PR without any psychosocial issues or concerns. For [name] to continue to attend PR without any psychosocial concerns or issues.     Interventions Encouraged to attend Pulmonary Rehabilitation for the exercise Encouraged to attend Pulmonary Rehabilitation for the exercise Encouraged to attend Pulmonary Rehabilitation for the exercise     Continue Psychosocial Services  Follow up required by staff Follow up required by staff Follow up required by staff              Psychosocial Discharge (Final Psychosocial Re-Evaluation):  Psychosocial Re-Evaluation - 12/16/21 1400       Psychosocial Re-Evaluation   Current issues with History of Depression;None Identified    Comments [name] has been participating in the PR program with no psychosocial barriers. He is on medication for his depression and this does not seem to be an issue at present. He has had a lot of illness lately with pulmonary excerbations but currently he is feeling much better.    Expected Outcomes For [name] to continue to attend PR without any psychosocial concerns or issues.    Interventions Encouraged to attend Pulmonary Rehabilitation for the exercise    Continue Psychosocial Services  Follow up required by staff             Education: Education Goals: Education classes will be provided on a weekly basis, covering required topics. Participant will state understanding/return demonstration of topics presented.  Learning Barriers/Preferences:  Learning Barriers/Preferences - 10/05/21 0908       Learning Barriers/Preferences  Learning Barriers None    Learning Preferences Computer/Internet;Audio;Group Instruction;Individual Instruction;Pictoral;Skilled Demonstration;Verbal Instruction;Video;Written Material             Education Topics: Risk  Factor Reduction:  -Group instruction that is supported by a PowerPoint presentation. Instructor discusses the definition of a risk factor, different risk factors for pulmonary disease, and how the heart and lungs work together.     Nutrition for Pulmonary Patient:  -Group instruction provided by PowerPoint slides, verbal discussion, and written materials to support subject matter. The instructor gives an explanation and review of healthy diet recommendations, which includes a discussion on weight management, recommendations for fruit and vegetable consumption, as well as protein, fluid, caffeine, fiber, sodium, sugar, and alcohol. Tips for eating when patients are short of breath are discussed. Flowsheet Row PULMONARY REHAB OTHER RESPIRATORY from 12/10/2021 in [place]  Date 11/12/21  Educator Sam  Instruction Review Code 1- Verbalizes Understanding       Pursed Lip Breathing:  -Group instruction that is supported by demonstration and informational handouts. Instructor discusses the benefits of pursed lip and diaphragmatic breathing and detailed demonstration on how to preform both.   Flowsheet Row PULMONARY REHAB OTHER RESPIRATORY from 01/24/2020 in [place]  Date 12/25/19  Educator Handout  Instruction Review Code --  [N/A]       Oxygen Safety:  -Group instruction provided by PowerPoint, verbal discussion, and written material to support subject matter. There is an overview of "What is Oxygen" and "Why do we need it".  Instructor also reviews how to create a safe environment for oxygen use, the importance of using oxygen as prescribed, and the risks of noncompliance. There is a brief discussion on traveling with oxygen and resources the patient may utilize. Flowsheet Row PULMONARY REHAB OTHER RESPIRATORY from 12/10/2021 in [place]  Date 12/10/21  Educator Donnetta Simpers  [Handout]  Instruction  Review Code 1- Verbalizes Understanding       Oxygen Equipment:  -Group instruction provided by [employer] Staff utilizing handouts, written materials, and equipment demonstrations.   Signs and Symptoms:  -Group instruction provided by written material and verbal discussion to support subject matter. Warning signs and symptoms of infection, stroke, and heart attack are reviewed and when to call the physician/911 reinforced. Tips for preventing the spread of infection discussed.   Advanced Directives:  -Group instruction provided by verbal instruction and written material to support subject matter. Instructor reviews Advanced Directive laws and proper instruction for filling out document.   Pulmonary Video:  -Group video education that reviews the importance of medication and oxygen compliance, exercise, good nutrition, pulmonary hygiene, and pursed lip and diaphragmatic breathing for the pulmonary patient.   Exercise for the Pulmonary Patient:  -Group instruction that is supported by a PowerPoint presentation. Instructor discusses benefits of exercise, core components of exercise, frequency, duration, and intensity of an exercise routine, importance of utilizing pulse oximetry during exercise, safety while exercising, and options of places to exercise outside of rehab.     Pulmonary Medications:  -Verbally interactive group education provided by instructor with focus on inhaled medications and proper administration.   Anatomy and Physiology of the Respiratory System and Intimacy:  -Group instruction provided by PowerPoint, verbal discussion, and written material to support subject matter. Instructor reviews respiratory cycle and anatomical components of the respiratory system and their functions. Instructor also reviews differences in obstructive and restrictive respiratory diseases with examples of each. Intimacy, Sex,  and Sexuality differences are reviewed with a discussion on how  relationships can change when diagnosed with pulmonary disease. Common sexual concerns are reviewed.   MD DAY -A group question and answer session with a medical doctor that allows participants to ask questions that relate to their pulmonary disease state.   OTHER EDUCATION -Group or individual verbal, written, or video instructions that support the educational goals of the pulmonary rehab program. [place] from 12/10/2021 in [place]  Date 12/03/21  [facility] Levels]  Educator Donnetta Simpers  [Handout oxygen use]  Instruction Review Code 1- [employer] Survival Tips:  -Group instruction provided by [employer], verbal discussion, and written materials to support subject matter. The instructor gives patients tips, tricks, and techniques to help them not only survive but enjoy the holidays despite the onslaught of food that accompanies the holidays.   Knowledge Questionnaire Score:  Knowledge Questionnaire Score - 12/10/21 1547       Knowledge Questionnaire Score   Post Score 18/18             Core Components/Risk Factors/Patient Goals at Admission:  Personal Goals and Risk Factors at Admission - 10/05/21 0908       Core Components/Risk Factors/Patient Goals on Admission   Improve shortness of breath with ADL's Yes    Intervention Provide education, individualized exercise plan and daily activity instruction to help decrease symptoms of SOB with activities of daily living.    Expected Outcomes Short Term: Improve cardiorespiratory fitness to achieve a reduction of symptoms when performing ADLs;Long Term: Be able to perform more ADLs without symptoms or delay the onset of symptoms             Core Components/Risk Factors/Patient Goals Review:   Goals and Risk Factor Review     Row Name 10/22/21 0750 11/24/21 0955 12/16/21 1406         Core Components/Risk  Factors/Patient Goals Review   Personal Goals Review Improve shortness of breath with ADL's;Develop more efficient breathing techniques such as purse lipped breathing and diaphragmatic breathing and practicing self-pacing with activity.;Increase knowledge of respiratory medications and ability to use respiratory devices properly. Improve shortness of breath with ADL's;Increase knowledge of respiratory medications and ability to use respiratory devices properly.;Develop more efficient breathing techniques such as purse lipped breathing and diaphragmatic breathing and practicing self-pacing with activity. Improve shortness of breath with ADL's;Develop more efficient breathing techniques such as purse lipped breathing and diaphragmatic breathing and practicing self-pacing with activity.;Increase knowledge of respiratory medications and ability to use respiratory devices properly.;Lipids     Review [name] has attended 4 session thus far. He has been progressing well with his exercise and METs.He reports mild SOB with activity. [name] has made some progress with his exercise sessions but , what has held him back has been his lack of attndence due to his sickness.He has been slowly increasing his workloads and MET levels on the nustep and arm egometer.His oxygen saturations have been 95-98%  and he is reporting mild to mild with difficulty SOB on room air. [name] has been progressing slowly in the PR due to pulmonary illinesses and some low BP problems. The past 2 sessions he doing much better. He now is enjoying the program and and pushing himself to improve his workloads and Mets.Marland Kitchen He is exercising on the nustep and the arm egometer. His oxygen saturations have been 97-99% on room air.  Expected Outcomes See admission goals.Improve SOB with ADL and better breathing techniques. See admission goals. See admission goals. for [name] to continue to imporve his workloads and Mets.              Core Components/Risk  Factors/Patient Goals at Discharge (Final Review):   Goals and Risk Factor Review - 12/16/21 1406       Core Components/Risk Factors/Patient Goals Review   Personal Goals Review Improve shortness of breath with ADL's;Develop more efficient breathing techniques such as purse lipped breathing and diaphragmatic breathing and practicing self-pacing with activity.;Increase knowledge of respiratory medications and ability to use respiratory devices properly.;Lipids    Review [name] has been progressing slowly in the PR due to pulmonary illinesses and some low BP problems. The past 2 sessions he doing much better. He now is enjoying the program and and pushing himself to improve his workloads and Mets.Marland Kitchen He is exercising on the nustep and the arm egometer. His oxygen saturations have been 97-99% on room air.    Expected Outcomes See admission goals. for [name] to continue to imporve his workloads and Mets.             ITP Comments: Pt is making expected progress toward Pulmonary Rehab goals after completing 15 sessions. Recommend continued exercise, life style modification, education, and utilization of breathing techniques to increase stamina and strength, while also decreasing shortness of breath with exertion.  Dr. Rodman Pickle is Medical Director for Pulmonary Rehab at [facility].

## 2021-12-24 ENCOUNTER — Encounter (HOSPITAL_COMMUNITY)
Admission: RE | Admit: 2021-12-24 | Discharge: 2021-12-24 | Disposition: A | Discharge status: Home / Self Care | Payer: Medicare Other | Source: Ambulatory Visit | Attending: Internal Medicine | Admitting: Internal Medicine

## 2021-12-24 ENCOUNTER — Ambulatory Visit (INDEPENDENT_AMBULATORY_CARE_PROVIDER_SITE_OTHER): Payer: Medicare Other | Admitting: Rheumatology

## 2021-12-24 ENCOUNTER — Encounter: Payer: Self-pay | Admitting: Rheumatology

## 2021-12-24 VITALS — BP 117/76 | HR 84 | Resp 12 | Ht 71.5 in | Wt 179.2 lb

## 2021-12-24 DIAGNOSIS — M4004 Postural kyphosis, thoracic region: Secondary | ICD-10-CM

## 2021-12-24 DIAGNOSIS — I441 Atrioventricular block, second degree: Secondary | ICD-10-CM | POA: Diagnosis not present

## 2021-12-24 DIAGNOSIS — J432 Centrilobular emphysema: Secondary | ICD-10-CM | POA: Diagnosis not present

## 2021-12-24 DIAGNOSIS — R768 Other specified abnormal immunological findings in serum: Secondary | ICD-10-CM | POA: Diagnosis not present

## 2021-12-24 DIAGNOSIS — M47816 Spondylosis without myelopathy or radiculopathy, lumbar region: Secondary | ICD-10-CM | POA: Diagnosis not present

## 2021-12-24 DIAGNOSIS — K567 Ileus, unspecified: Secondary | ICD-10-CM

## 2021-12-24 DIAGNOSIS — M19071 Primary osteoarthritis, right ankle and foot: Secondary | ICD-10-CM | POA: Diagnosis not present

## 2021-12-24 DIAGNOSIS — K2270 Barrett's esophagus without dysplasia: Secondary | ICD-10-CM

## 2021-12-24 DIAGNOSIS — J849 Interstitial pulmonary disease, unspecified: Secondary | ICD-10-CM

## 2021-12-24 DIAGNOSIS — Z87891 Personal history of nicotine dependence: Secondary | ICD-10-CM

## 2021-12-24 DIAGNOSIS — Z8719 Personal history of other diseases of the digestive system: Secondary | ICD-10-CM

## 2021-12-24 DIAGNOSIS — I493 Ventricular premature depolarization: Secondary | ICD-10-CM

## 2021-12-24 DIAGNOSIS — E210 Primary hyperparathyroidism: Secondary | ICD-10-CM

## 2021-12-24 DIAGNOSIS — M19072 Primary osteoarthritis, left ankle and foot: Secondary | ICD-10-CM

## 2021-12-24 DIAGNOSIS — M5030 Other cervical disc degeneration, unspecified cervical region: Secondary | ICD-10-CM

## 2021-12-24 DIAGNOSIS — M19041 Primary osteoarthritis, right hand: Secondary | ICD-10-CM

## 2021-12-24 DIAGNOSIS — M19042 Primary osteoarthritis, left hand: Secondary | ICD-10-CM

## 2021-12-24 DIAGNOSIS — I2510 Atherosclerotic heart disease of native coronary artery without angina pectoris: Secondary | ICD-10-CM

## 2021-12-24 DIAGNOSIS — Z96642 Presence of left artificial hip joint: Secondary | ICD-10-CM | POA: Diagnosis not present

## 2021-12-24 NOTE — Patient Instructions (Signed)
Cervical Strain and Sprain Rehab Ask your health care provider which exercises are safe for you. Do exercises exactly as told by your health care provider and adjust them as directed. It is normal to feel mild stretching, pulling, tightness, or discomfort as you do these exercises. Stop right away if you feel sudden pain or your pain gets worse. Do not begin these exercises until told by your health care provider. Stretching and range-of-motion exercises Cervical side bending  Using good posture, sit on a stable chair or stand up. Without moving your shoulders, slowly tilt your left / right ear to your shoulder until you feel a stretch in the opposite side neck muscles. You should be looking straight ahead. Hold for __________ seconds. Repeat with the other side of your neck. Repeat __________ times. Complete this exercise __________ times a day. Cervical rotation  Using good posture, sit on a stable chair or stand up. Slowly turn your head to the side as if you are looking over your left / right shoulder. Keep your eyes level with the ground. Stop when you feel a stretch along the side and the back of your neck. Hold for __________ seconds. Repeat this by turning to your other side. Repeat __________ times. Complete this exercise __________ times a day. Thoracic extension and pectoral stretch  Roll a towel or a small blanket so it is about 4 inches (10 cm) in diameter. Lie down on your back on a firm surface. Put the towel in the middle of your back across your spine. It should not be under your shoulder blades. Put your hands behind your head and let your elbows fall out to your sides. Hold for __________ seconds. Repeat __________ times. Complete this exercise __________ times a day. Strengthening exercises Isometric upper cervical flexion  Lie on your back with a thin pillow behind your head and a small rolled-up towel under your neck. Gently tuck your chin toward your chest and  nod your head down to look toward your feet. Do not lift your head off the pillow. Hold for __________ seconds. Release the tension slowly. Relax your neck muscles completely before you repeat this exercise. Repeat __________ times. Complete this exercise __________ times a day. Isometric cervical extension  Stand about 6 inches (15 cm) away from a wall, with your back facing the wall. Place a soft object, about 6-8 inches (15-20 cm) in diameter, between the back of your head and the wall. A soft object could be a small pillow, a ball, or a folded towel. Gently tilt your head back and press into the soft object. Keep your jaw and forehead relaxed. Hold for __________ seconds. Release the tension slowly. Relax your neck muscles completely before you repeat this exercise. Repeat __________ times. Complete this exercise __________ times a day. Posture and body mechanics Body mechanics refers to the movements and positions of your body while you do your daily activities. Posture is part of body mechanics. Good posture and healthy body mechanics can help to relieve stress in your body's tissues and joints. Good posture means that your spine is in its natural S-curve position (your spine is neutral), your shoulders are pulled back slightly, and your head is not tipped forward. The following are general guidelines for applying improved posture and body mechanics to your everyday activities. Sitting  When sitting, keep your spine neutral and keep your feet flat on the floor. Use a footrest, if necessary, and keep your thighs parallel to the floor. Avoid rounding   your shoulders, and avoid tilting your head forward. When working at a desk or a computer, keep your desk at a height where your hands are slightly lower than your elbows. Slide your chair under your desk so you are close enough to maintain good posture. When working at a computer, place your monitor at a height where you are looking straight ahead  and you do not have to tilt your head forward or downward to look at the screen. Standing  When standing, keep your spine neutral and keep your feet about hip-width apart. Keep a slight bend in your knees. Your ears, shoulders, and hips should line up. When you do a task in which you stand in one place for a long time, place one foot up on a stable object that is 2-4 inches (5-10 cm) high, such as a footstool. This helps keep your spine neutral. Resting When lying down and resting, avoid positions that are most painful for you. Try to support your neck in a neutral position. You can use a contour pillow or a small rolled-up towel. Your pillow should support your neck but not push on it. This information is not intended to replace advice given to you by your health care provider. Make sure you discuss any questions you have with your health care provider. Document Revised: 05/11/2021 Document Reviewed: 05/11/2021 Elsevier Patient Education  2023 [employer]  

## 2021-12-24 NOTE — Progress Notes (Signed)
Daily Session Note  Patient Details  Name: ARSENIO SCHNORR MRN: [id number] Date of Birth: 1951/02/08 Referring Provider:   April Manson Pulmonary Rehab Walk Test from 10/05/2021 in [place]  Referring Provider Ramaswamy       Encounter Date: 12/24/2021  Check In:  Session Check In - 12/24/21 1111       Check-In   Supervising physician immediately available to respond to emergencies [facility] immediately available    Physician(s) Dr. Karleen Hampshire    Location [facility]    Staff Present Rosebud Poles, RN, BSN;Misk Galentine Ysidro Evert, Cathleen Fears, MS, ACSM-CEP, Exercise Physiologist    Virtual Visit No    Medication changes reported     No    Fall or balance concerns reported    No    Tobacco Cessation No Change    Warm-up and Cool-down Performed as group-led instruction    Resistance Training Performed Yes    VAD Patient? No    PAD/SET Patient? No      Pain Assessment   Currently in Pain? No/denies    Multiple Pain Sites No             Capillary Blood Glucose: No results found for this or any previous visit (from the past 24 hour(s)).    Social History   Tobacco Use  Smoking Status Former   Packs/day: 1.50   Years: 24.00   Total pack years: 36.00   Types: Cigarettes   Start date: 1966   Quit date: 07/05/1985   Years since quitting: 36.4  Smokeless Tobacco Never    Goals Met:  Proper associated with RPD/PD & O2 Sat Independence with exercise equipment Personal goals reviewed No report of concerns or symptoms today Strength training completed today  Goals Unmet:  Not Applicable  Comments: Service time is from 1015 to 1135    Dr. Rodman Pickle is Medical Director for Pulmonary Rehab at [facility].

## 2021-12-25 ENCOUNTER — Other Ambulatory Visit: Payer: Self-pay | Admitting: *Deleted

## 2021-12-25 DIAGNOSIS — M5030 Other cervical disc degeneration, unspecified cervical region: Secondary | ICD-10-CM

## 2021-12-28 ENCOUNTER — Encounter: Payer: Self-pay | Admitting: Podiatry

## 2021-12-28 ENCOUNTER — Ambulatory Visit (INDEPENDENT_AMBULATORY_CARE_PROVIDER_SITE_OTHER): Payer: Medicare Other | Admitting: Podiatry

## 2021-12-28 DIAGNOSIS — B351 Tinea unguium: Secondary | ICD-10-CM | POA: Diagnosis not present

## 2021-12-28 DIAGNOSIS — M79674 Pain in right toe(s): Secondary | ICD-10-CM | POA: Diagnosis not present

## 2021-12-28 DIAGNOSIS — M79675 Pain in left toe(s): Secondary | ICD-10-CM

## 2021-12-29 ENCOUNTER — Encounter (HOSPITAL_COMMUNITY)
Admission: RE | Admit: 2021-12-29 | Discharge: 2021-12-29 | Disposition: A | Discharge status: Home / Self Care | Payer: Medicare Other | Source: Ambulatory Visit | Attending: Internal Medicine | Admitting: Internal Medicine

## 2021-12-29 DIAGNOSIS — J849 Interstitial pulmonary disease, unspecified: Secondary | ICD-10-CM | POA: Diagnosis not present

## 2021-12-29 DIAGNOSIS — J432 Centrilobular emphysema: Secondary | ICD-10-CM | POA: Diagnosis not present

## 2021-12-29 NOTE — Progress Notes (Signed)
Discharge Progress Report  Patient Details  Name: [name] MRN: [id number] Date of Birth: 06/05/1951 Referring Provider:   Doristine Devoid Pulmonary Rehab Walk Test from 10/05/2021 in [facility]  Referring Provider Ramaswamy        Number of Visits: 17  Reason for Discharge:  Patient has met program and personal goals.  Smoking History:  Social History   Tobacco Use  Smoking Status Former   Packs/day: 1.50   Years: 24.00   Total pack years: 36.00   Types: Cigarettes   Start date: 1966   Quit date: 07/05/1985   Years since quitting: 36.5   Passive exposure: Never  Smokeless Tobacco Never    Diagnosis:  Interstitial lung disease (HCC)  Centrilobular emphysema (HCC)  ADL [facility]:  Pulmonary Assessment Scores     Row Name 10/05/21 0922 12/10/21 1548 12/29/21 1208     ADL [facility]   ADL Phase Entry -- --   SOB Score total 37 26 --     CAT Score   CAT Score 6 13 --     mMRC Score   mMRC Score 3 -- 2            Initial Exercise Prescription:  Initial Exercise Prescription - 10/05/21 0900       Date of Initial Exercise RX and Referring Provider   Date 10/05/21    Referring Provider Ramaswamy    Expected Discharge Date 12/10/21      NuStep   Level 1    SPM 80    Minutes 15      Arm Ergometer   Level 1    Watts 4    Minutes 15      Prescription Details   Frequency (times per week) 2    Duration Progress to 30 minutes of continuous aerobic without signs/symptoms of physical distress      Intensity   THRR 40-80% of Max Heartrate 60-119    Ratings of Perceived Exertion 11-13    Perceived Dyspnea 0-4      Progression   Progression Continue to progress workloads to maintain intensity without signs/symptoms of physical distress.      Resistance Training   Training Prescription Yes    Weight blue bands    Reps 10-15             Discharge Exercise Prescription (Final Exercise Prescription Changes):   Exercise Prescription Changes - 12/15/21 1200       Response to Exercise   Blood Pressure (Admit) 121/75    Blood Pressure (Exercise) 112/60    Blood Pressure (Exit) 106/60    Heart Rate (Admit) 75 bpm    Heart Rate (Exercise) 107 bpm    Heart Rate (Exit) 89 bpm    Oxygen Saturation (Admit) 98 %    Oxygen Saturation (Exercise) 97 %    Oxygen Saturation (Exit) 97 %    Rating of Perceived Exertion (Exercise) 13    Perceived Dyspnea (Exercise) 1.5    Duration Continue with 30 min of aerobic exercise without signs/symptoms of physical distress.    Intensity THRR unchanged      Progression   Progression Continue to progress workloads to maintain intensity without signs/symptoms of physical distress.      Resistance Training   Training Prescription Yes    Weight Blue bands    Reps 10-15    Time 10 Minutes      NuStep   Level 4    SPM  80    Minutes 15    METs 2.7      Arm Ergometer   Level 2    Minutes 15    METs 2.3             Functional Capacity:  6 Minute Walk     Row Name 10/05/21 0953 12/29/21 1152       6 Minute Walk   Phase Initial Discharge    Distance 1398 feet 1670 feet    Distance % Change -- 19.46 %    Distance Feet Change -- 272 ft    Walk Time 6 minutes 6 minutes    # of Rest Breaks 0 0    MPH 2.65 3.16    METS 3.16 3.71    RPE 11 12    Perceived Dyspnea  0.5 2    VO2 Peak 11.06 12.99    Symptoms No No    Resting HR 79 bpm 87 bpm    Resting BP 120/66 96/58    Resting Oxygen Saturation  98 % 97 %    Exercise Oxygen Saturation  during 6 min walk 99 % 97 %    Max Ex. HR 100 bpm 110 bpm    Max Ex. BP 124/64 116/64    2 Minute Post BP 112/60 104/64      Interval HR   1 Minute HR 97 99    2 Minute HR 100 104    3 Minute HR 100 107    4 Minute HR 100 107    5 Minute HR 100 110    6 Minute HR 99 108    2 Minute Post HR 82 87    Interval Heart Rate? Yes Yes      Interval Oxygen   Interval Oxygen? Yes Yes    Baseline Oxygen Saturation %  98 % 97 %    1 Minute Oxygen Saturation % 100 % 99 %    1 Minute Liters of Oxygen 0 L 0 L    2 Minute Oxygen Saturation % 99 % 99 %    2 Minute Liters of Oxygen 0 L 0 L    3 Minute Oxygen Saturation % 99 % 99 %    3 Minute Liters of Oxygen 0 L 0 L    4 Minute Oxygen Saturation % 99 % 98 %    4 Minute Liters of Oxygen 0 L 0 L    5 Minute Oxygen Saturation % 99 % 100 %    5 Minute Liters of Oxygen 0 L 0 L    6 Minute Oxygen Saturation % 99 % 98 %    6 Minute Liters of Oxygen 0 L 0 L    2 Minute Post Oxygen Saturation % 100 % 99 %    2 Minute Post Liters of Oxygen 0 L 0 L             Psychological, QOL, Others - Outcomes: PHQ 2/9:    12/29/2021   10:06 AM 10/05/2021    9:05 AM 12/12/2019   11:13 AM 12/12/2019   11:12 AM  Depression screen PHQ 2/9  Decreased Interest 0 0  0  Down, Depressed, Hopeless 0 0  0  PHQ - 2 Score 0 0  0  Altered sleeping 2 0 3   Tired, decreased energy 0 1 3   Change in appetite 1 0 1   Feeling bad or failure about yourself  0 0 0  Trouble concentrating 0 0 0   Moving slowly or fidgety/restless 0 0 0   Suicidal thoughts 0 0 0   PHQ-9 Score 3 1    Difficult doing work/chores Not difficult at all Not difficult at all Not difficult at all     Quality of Life:   Personal Goals: Goals established at orientation with interventions provided to work toward goal.  Personal Goals and Risk Factors at Admission - 10/05/21 0908       Core Components/Risk Factors/Patient Goals on Admission   Improve shortness of breath with ADL's Yes    Intervention Provide education, individualized exercise plan and daily activity instruction to help decrease symptoms of SOB with activities of daily living.    Expected Outcomes Short Term: Improve cardiorespiratory fitness to achieve a reduction of symptoms when performing ADLs;Long Term: Be able to perform more ADLs without symptoms or delay the onset of symptoms              Personal Goals Discharge:  Goals and  Risk Factor Review     Row Name 10/22/21 0750 11/24/21 0955 12/16/21 1406         Core Components/Risk Factors/Patient Goals Review   Personal Goals Review Improve shortness of breath with ADL's;Develop more efficient breathing techniques such as purse lipped breathing and diaphragmatic breathing and practicing self-pacing with activity.;Increase knowledge of respiratory medications and ability to use respiratory devices properly. Improve shortness of breath with ADL's;Increase knowledge of respiratory medications and ability to use respiratory devices properly.;Develop more efficient breathing techniques such as purse lipped breathing and diaphragmatic breathing and practicing self-pacing with activity. Improve shortness of breath with ADL's;Develop more efficient breathing techniques such as purse lipped breathing and diaphragmatic breathing and practicing self-pacing with activity.;Increase knowledge of respiratory medications and ability to use respiratory devices properly.;Lipids     Review [name] has attended 4 session thus far. He has been progressing well with his exercise and METs.He reports mild SOB with activity. [name] has made some progress with his exercise sessions but , what has held him back has been his lack of attndence due to his sickness.He has been slowly increasing his workloads and MET levels on the nustep and arm egometer.His oxygen saturations have been 95-98%  and he is reporting mild to mild with difficulty SOB on room air. [name] has been progressing slowly in the PR due to pulmonary illinesses and some low BP problems. The past 2 sessions he doing much better. He now is enjoying the program and and pushing himself to improve his workloads and Mets.Marland Kitchen He is exercising on the nustep and the arm egometer. His oxygen saturations have been 97-99% on room air.     Expected Outcomes See admission goals.Improve SOB with ADL and better breathing techniques. See admission goals. See admission  goals. for [name] to continue to imporve his workloads and Mets.              Exercise Goals and Review:  Exercise Goals     Row Name 10/05/21 0912 10/21/21 1104 11/16/21 1154 12/15/21 0929       Exercise Goals   Increase Physical Activity Yes Yes Yes Yes    Intervention Provide advice, education, support and counseling about physical activity/exercise needs.;Develop an individualized exercise prescription for aerobic and resistive training based on initial evaluation findings, risk stratification, comorbidities and participant's personal goals. Provide advice, education, support and counseling about physical activity/exercise needs.;Develop an individualized exercise prescription for aerobic and resistive training based on initial  evaluation findings, risk stratification, comorbidities and participant's personal goals. Provide advice, education, support and counseling about physical activity/exercise needs.;Develop an individualized exercise prescription for aerobic and resistive training based on initial evaluation findings, risk stratification, comorbidities and participant's personal goals. Provide advice, education, support and counseling about physical activity/exercise needs.;Develop an individualized exercise prescription for aerobic and resistive training based on initial evaluation findings, risk stratification, comorbidities and participant's personal goals.    Expected Outcomes Short Term: Attend rehab on a regular basis to increase amount of physical activity.;Long Term: Add in home exercise to make exercise part of routine and to increase amount of physical activity.;Long Term: Exercising regularly at least 3-5 days a week. Short Term: Attend rehab on a regular basis to increase amount of physical activity.;Long Term: Add in home exercise to make exercise part of routine and to increase amount of physical activity.;Long Term: Exercising regularly at least 3-5 days a week. Short Term:  Attend rehab on a regular basis to increase amount of physical activity.;Long Term: Add in home exercise to make exercise part of routine and to increase amount of physical activity.;Long Term: Exercising regularly at least 3-5 days a week. Short Term: Attend rehab on a regular basis to increase amount of physical activity.;Long Term: Add in home exercise to make exercise part of routine and to increase amount of physical activity.;Long Term: Exercising regularly at least 3-5 days a week.    Increase Strength and Stamina Yes Yes Yes Yes    Intervention Provide advice, education, support and counseling about physical activity/exercise needs.;Develop an individualized exercise prescription for aerobic and resistive training based on initial evaluation findings, risk stratification, comorbidities and participant's personal goals. Provide advice, education, support and counseling about physical activity/exercise needs.;Develop an individualized exercise prescription for aerobic and resistive training based on initial evaluation findings, risk stratification, comorbidities and participant's personal goals. Provide advice, education, support and counseling about physical activity/exercise needs.;Develop an individualized exercise prescription for aerobic and resistive training based on initial evaluation findings, risk stratification, comorbidities and participant's personal goals. Provide advice, education, support and counseling about physical activity/exercise needs.;Develop an individualized exercise prescription for aerobic and resistive training based on initial evaluation findings, risk stratification, comorbidities and participant's personal goals.    Expected Outcomes Short Term: Increase workloads from initial exercise prescription for resistance, speed, and METs.;Short Term: Perform resistance training exercises routinely during rehab and add in resistance training at home;Long Term: Improve  cardiorespiratory fitness, muscular endurance and strength as measured by increased METs and functional capacity ( ) Short Term: Increase workloads from initial exercise prescription for resistance, speed, and METs.;Short Term: Perform resistance training exercises routinely during rehab and add in resistance training at home;Long Term: Improve cardiorespiratory fitness, muscular endurance and strength as measured by increased METs and functional capacity ( ) Short Term: Increase workloads from initial exercise prescription for resistance, speed, and METs.;Short Term: Perform resistance training exercises routinely during rehab and add in resistance training at home;Long Term: Improve cardiorespiratory fitness, muscular endurance and strength as measured by increased METs and functional capacity ( ) Short Term: Increase workloads from initial exercise prescription for resistance, speed, and METs.;Short Term: Perform resistance training exercises routinely during rehab and add in resistance training at home;Long Term: Improve cardiorespiratory fitness, muscular endurance and strength as measured by increased METs and functional capacity ( )    Able to understand and use rate of perceived exertion (RPE) scale Yes Yes Yes Yes    Intervention Provide education and explanation on how to use RPE scale Provide education  and explanation on how to use RPE scale Provide education and explanation on how to use RPE scale Provide education and explanation on how to use RPE scale    Expected Outcomes Short Term: Able to use RPE daily in rehab to express subjective intensity level;Long Term:  Able to use RPE to guide intensity level when exercising independently Short Term: Able to use RPE daily in rehab to express subjective intensity level;Long Term:  Able to use RPE to guide intensity level when exercising independently Short Term: Able to use RPE daily in rehab to express subjective intensity level;Long Term:   Able to use RPE to guide intensity level when exercising independently Short Term: Able to use RPE daily in rehab to express subjective intensity level;Long Term:  Able to use RPE to guide intensity level when exercising independently    Able to understand and use Dyspnea scale Yes Yes Yes Yes    Intervention Provide education and explanation on how to use Dyspnea scale Provide education and explanation on how to use Dyspnea scale Provide education and explanation on how to use Dyspnea scale Provide education and explanation on how to use Dyspnea scale    Expected Outcomes Short Term: Able to use Dyspnea scale daily in rehab to express subjective sense of shortness of breath during exertion;Long Term: Able to use Dyspnea scale to guide intensity level when exercising independently Short Term: Able to use Dyspnea scale daily in rehab to express subjective sense of shortness of breath during exertion;Long Term: Able to use Dyspnea scale to guide intensity level when exercising independently Short Term: Able to use Dyspnea scale daily in rehab to express subjective sense of shortness of breath during exertion;Long Term: Able to use Dyspnea scale to guide intensity level when exercising independently Short Term: Able to use Dyspnea scale daily in rehab to express subjective sense of shortness of breath during exertion;Long Term: Able to use Dyspnea scale to guide intensity level when exercising independently    Knowledge and understanding of Target Heart Rate Range (THRR) Yes Yes Yes Yes    Intervention Provide education and explanation of THRR including how the numbers were predicted and where they are located for reference Provide education and explanation of THRR including how the numbers were predicted and where they are located for reference Provide education and explanation of THRR including how the numbers were predicted and where they are located for reference Provide education and explanation of THRR  including how the numbers were predicted and where they are located for reference    Expected Outcomes Short Term: Able to state/look up THRR;Long Term: Able to use THRR to govern intensity when exercising independently;Short Term: Able to use daily as guideline for intensity in rehab Short Term: Able to state/look up THRR;Long Term: Able to use THRR to govern intensity when exercising independently;Short Term: Able to use daily as guideline for intensity in rehab Short Term: Able to state/look up THRR;Long Term: Able to use THRR to govern intensity when exercising independently;Short Term: Able to use daily as guideline for intensity in rehab Short Term: Able to state/look up THRR;Long Term: Able to use THRR to govern intensity when exercising independently;Short Term: Able to use daily as guideline for intensity in rehab    Understanding of Exercise Prescription Yes Yes Yes Yes    Intervention Provide education, explanation, and written materials on patient's individual exercise prescription Provide education, explanation, and written materials on patient's individual exercise prescription Provide education, explanation, and written materials on patient's  individual exercise prescription Provide education, explanation, and written materials on patient's individual exercise prescription    Expected Outcomes Short Term: Able to explain program exercise prescription;Long Term: Able to explain home exercise prescription to exercise independently Short Term: Able to explain program exercise prescription;Long Term: Able to explain home exercise prescription to exercise independently Short Term: Able to explain program exercise prescription;Long Term: Able to explain home exercise prescription to exercise independently Short Term: Able to explain program exercise prescription;Long Term: Able to explain home exercise prescription to exercise independently             Exercise Goals Re-Evaluation:  Exercise  Goals Re-Evaluation     Row Name 10/21/21 1104 11/16/21 1154 12/15/21 0930         Exercise Goal Re-Evaluation   Exercise Goals Review Increase Physical Activity;Increase Strength and Stamina;Able to understand and use rate of perceived exertion (RPE) scale;Able to understand and use Dyspnea scale;Knowledge and understanding of Target Heart Rate Range (THRR);Understanding of Exercise Prescription Increase Physical Activity;Increase Strength and Stamina;Able to understand and use rate of perceived exertion (RPE) scale;Able to understand and use Dyspnea scale;Knowledge and understanding of Target Heart Rate Range (THRR);Understanding of Exercise Prescription Increase Physical Activity;Increase Strength and Stamina;Able to understand and use rate of perceived exertion (RPE) scale;Able to understand and use Dyspnea scale;Knowledge and understanding of Target Heart Rate Range (THRR);Understanding of Exercise Prescription     Comments [name] has completed 3 exercise sessions. He exercises for 15 min on the arm ergometer and Nustep. He averages 2.1 METs at level 1.5 on the arm erg and 2.1 METs at level 2 on the Nustep. He increased his level on the Nustep on the first day of exercise. He has tolerated level 2 well. He also increased his level on the arm erg recently as he tolerated this well. [name] is motivated to exercise and improve his functional capacity. He seems to enjoy rehab. Will continue to monitor and progress as able. [name] has completed 8 exercise sessions. He exercises for 15 min on the arm ergometer and Nustep. He averages 2.1 METs at level 1.5 on the arm erg and 2.7 METs at level 3 on the Nustep. He increased his level on the Nustep and tolerates this well. He has not increased his level on the arm ergometer. Will consider increasing. [name] is motivated to exercise and improve his functional capacity. He exercises at home consistently. Will continue to monitor and progress as able. [name] has completed 13  exercise sessions. He exercises for 15 min on the arm ergometer and Nustep. He averages 2.1 METs at level 2 on the arm erg and 2.5 METs at level 4 on the Nustep. He increased his level for both exercise modes, although METs have remained the same. [name] is motivated to exercise and improve his functional capacity. He exercises at home consistently. Will continue to monitor and progress as able.     Expected Outcomes Through exercise at rehab and at home, the patient will decrease shortness of breath with daily activities and feel confident in carrying out an exercise regime at home. Through exercise at rehab and at home, the patient will decrease shortness of breath with daily activities and feel confident in carrying out an exercise regime at home. Through exercise at rehab and at home, the patient will decrease shortness of breath with daily activities and feel confident in carrying out an exercise regime at home.              Nutrition &  Weight - Outcomes:   Post Biometrics - 12/29/21 1207        Post  Biometrics   Grip Strength 45 kg             Nutrition:  Nutrition Therapy & Goals - 12/22/21 1152       Nutrition Therapy   Diet Heart Healthy Diet    Drug/Food Interactions Statins/Certain Fruits      Personal Nutrition Goals   Nutrition Goal Patient to identify and benefit of including fruits, vegetables, whole grains, low fat dairy, lean protein/plant protein as part of heart healthy meal plan.   in progress   Personal Goal #2 Patient to identify strategies for weight maintenance/weight gain during periods of COPD exacerbation/low PO intake.   in progress   Comments Goals in progress. He is down 4.4# of unintentional weight loss since starting with our program; however, he is up ~2# over the last week. He reports good knowledge of strategies for weight gain and good appetitie. His family remains extremely supportive.      Intervention Plan   Intervention Prescribe, educate and  counsel regarding individualized specific dietary modifications aiming towards targeted core components such as weight, hypertension, lipid management, diabetes, heart failure and other comorbidities.    Expected Outcomes Short Term Goal: Understand basic principles of dietary content, such as calories, fat, sodium, cholesterol and nutrients.;Long Term Goal: Adherence to prescribed nutrition plan.             Nutrition Discharge:   Education Questionnaire Score:  Knowledge Questionnaire Score - 12/10/21 1547       Knowledge Questionnaire Score   Post Score 18/18             Goals reviewed with patient; copy given to patient.

## 2021-12-30 NOTE — Telephone Encounter (Signed)
Pt has been seen for a visit recently and results of the CT were discussed with pt during that [place]. Nothing further needed.

## 2022-01-01 DIAGNOSIS — N401 Enlarged prostate with lower urinary tract symptoms: Secondary | ICD-10-CM | POA: Diagnosis not present

## 2022-01-01 DIAGNOSIS — R972 Elevated prostate specific antigen [PSA]: Secondary | ICD-10-CM | POA: Diagnosis not present

## 2022-01-01 DIAGNOSIS — R3911 Hesitancy of micturition: Secondary | ICD-10-CM | POA: Diagnosis not present

## 2022-01-01 DIAGNOSIS — R3912 Poor urinary stream: Secondary | ICD-10-CM | POA: Diagnosis not present

## 2022-01-04 ENCOUNTER — Other Ambulatory Visit: Payer: Self-pay | Admitting: Urology

## 2022-01-04 DIAGNOSIS — R972 Elevated prostate specific antigen [PSA]: Secondary | ICD-10-CM

## 2022-01-04 NOTE — Progress Notes (Signed)
  Subjective:  Patient ID: [name], male    DOB: 03/05/1951,  MRN: [id number]  [name] presents to clinic today for painful thick toenails that are difficult to trim. Pain interferes with ambulation. Aggravating factors include wearing enclosed shoe gear. Pain is relieved with periodic professional debridement.  New problem(s): None.   PCP is Jonathon Jordan, MD , and last visit was December 24, 2021.  Allergies  Allergen Reactions   Bupropion Other (See Comments)    Caused altered mental status Altered mental status Caused altered mental status   Ciprofloxacin-Dexamethasone Other (See Comments)    Burning in ears when Cipro ear drops were used unknown Burning in ears when Cipro ear drops were used   Gabapentin Other (See Comments)    Caused weakness, dizziness, and forgetfulness  Weakness and dizziness with higher doses. Weakness and dizziness Caused weakness, dizziness, and forgetfulness   Ciprofloxacin Hcl Other (See Comments)    Burning in ears when drops were used   Clonidine Hcl Other (See Comments)    Review of Systems: Negative except as noted in the HPI.  Objective: No changes noted in today's physical examination. Constitutional [name] is a pleasant 71 y.o. Caucasian male, WD, WN in NAD. AAO x 3.   Vascular CFT <3 seconds b/l LE. Palpable DP/PT pulses b/l LE. Digital hair present b/l. Skin temperature gradient WNL b/l. No pain with calf compression b/l. No edema noted b/l. No cyanosis or clubbing noted b/l LE.  Neurologic Normal speech. Oriented to person, place, and time. Protective sensation intact 5/5 intact bilaterally with 10g monofilament b/l. Vibratory sensation intact b/l.  Dermatologic Pedal integument with normal turgor, texture and tone b/l LE. No open wounds b/l. No interdigital macerations b/l. Toenails 1-5 b/l elongated, thickened, discolored with subungual debris. +Tenderness with dorsal palpation of nailplates. No hyperkeratotic or  porokeratotic lesions present.  Orthopedic: Normal muscle strength 5/5 to all lower extremity muscle groups bilaterally. No pain, crepitus or joint limitation noted with ROM b/l LE. No gross bony pedal deformities b/l. Patient ambulates independently without assistive aids.   Radiographs: None  Assessment/Plan: 1. Pain due to onychomycosis of toenails of both feet   -Examined patient. -Patient to continue soft, supportive shoe gear daily. -Toenails 1-5 b/l were debrided in length and girth with sterile nail nippers and dremel without iatrogenic bleeding.  -Patient/POA to call should there be question/concern in the interim.   Return in about 3 months (around 03/30/2022).  Marzetta Board, DPM

## 2022-01-08 NOTE — Therapy (Signed)
OUTPATIENT PHYSICAL THERAPY EVALUATION   Patient Name: [name] MRN: [id number] DOB:28-Mar-1951, 71 y.o., male Today's Date: 01/11/2022   PT End of Session - 01/11/22 1013     Visit Number 1    Number of Visits 20    Date for PT Re-Evaluation 03/22/22    Authorization Type [employer]    Progress Note Due on Visit 10    PT Start Time 1016    PT Stop Time 1050    PT Time Calculation (min) 34 min    Activity Tolerance Patient tolerated treatment well    Behavior During Therapy WFL for tasks assessed/performed             Past Medical History:  Diagnosis Date   Arthritis    Bronchitis    history of   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 last stress test 2003   Pneumonia 2009   Primary immune deficiency disorder ([place])    Tumor cells, benign    lung, left side   Unstable angina ([place]) 10/28/2017   Past Surgical History:  Procedure Laterality Date   APPENDECTOMY  1984   CATARACT EXTRACTION     L and R eye   CHOLECYSTECTOMY  2002   HYDROCELE EXCISION Right 11/04/2016   Procedure: HYDROCELECTOMY ADULT;  Surgeon: Franchot Gallo, MD;  Location: [facility];  Service: Urology;  Laterality: Right;   INGUINAL HERNIA REPAIR Bilateral 11/04/2016   Procedure: HERNIA REPAIR INGUINAL ADULT BILATERAL;  Surgeon: Jackolyn Confer, MD;  Location: [facility];  Service: General;  Laterality: Bilateral;   INSERTION OF MESH Bilateral 11/04/2016   Procedure: INSERTION OF MESH;  Surgeon: Jackolyn Confer, MD;  Location: [facility];  Service: General;  Laterality:  Bilateral;   JOINT REPLACEMENT     Lt hip   LEFT HEART CATH AND CORONARY ANGIOGRAPHY N/A 10/31/2017   Procedure: LEFT HEART CATH AND CORONARY ANGIOGRAPHY;  Surgeon: Troy Sine, MD;  Location: [facility];  Service: Cardiovascular;  Laterality: N/A;   MASTOIDECTOMY  1996   NASAL SEPTUM SURGERY     sinus surgery   PARATHYROIDECTOMY     TOTAL HIP REVISION  06/14/2011   Procedure: TOTAL HIP REVISION;  Surgeon: Kerin Salen;  Location: [facility];  Service: Orthopedics;  Laterality: Left;  Left Acetabular  Hip Revision   Patient Active Problem List   Diagnosis Date Noted   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   Orthostatic dizziness 11/17/2021   Bilateral lower extremity edema 11/17/2021   Benign neoplasm of stomach 07/13/2021   Acute rhinosinusitis 06/03/2021   Cognitive change 08/05/2020   Acquired deformity of neck 06/23/2020   Allergic rhinitis 06/23/2020   Amnesia 06/23/2020   Cataract 06/23/2020   Cerebrovascular disease 06/23/2020   Closed fracture of one rib 06/23/2020   Coronary artery disease 06/23/2020   Dementia with behavioral disturbance ([place]) 06/23/2020   Dysgeusia 06/23/2020   Extrapyramidal and movement disorder, unspecified 06/23/2020   Gallstones 06/23/2020  Generalized lymphadenopathy 06/23/2020   Hardening of the aorta (main artery of the heart) (H[facility]) 06/23/2020   Herpes simplex viral infection 06/23/2020   Hypothyroid 06/23/2020   Insomnia 06/23/2020   Malignant melanoma of skin ([place]) 06/23/2020   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   Mitral valve disorder 06/23/2020   Monocytosis 06/23/2020   Neuropathic pain 06/23/2020   Prediabetes 06/23/2020   Renal cyst 06/23/2020   Senile purpura ([place]) 06/23/2020   Solitary pulmonary nodule 06/23/2020   Spinal stenosis in cervical region 06/23/2020   Bronchiectasis without complication ([place]) 16/04/9603   Centrilobular emphysema ([place]) 03/25/2020   Abnormal findings on diagnostic imaging of lung  03/25/2020   Acute exacerbation of COPD with asthma ([place]) 02/13/2020   Abnormal CT of the chest 07/30/2019   Elevated rheumatoid factor 07/30/2019   Oral candidiasis 07/30/2019   Sinus tachycardia 04/03/2019   Chronic otorrhea of left ear 03/28/2019   Confusion 01/02/2019   Bilateral impacted cerumen 01/31/2018   Abnormal EKG-dynamic TWI 10/31/2017   PVC's (premature ventricular contractions) 10/31/2017   Family history of coronary artery disease in father 10/31/2017   Unstable angina ([place]) 10/28/2017   Bilateral inguinal hernia 11/04/2016   Neck pain 10/14/2016   Mobitz type 1 second degree atrioventricular block 03/16/2016   Syncope 03/15/2016   Acute suppurative otitis media of left ear without spontaneous rupture of tympanic membrane 12/24/2015   Malnutrition of moderate degree 10/21/2015   Hyperkinetic disorder    Choreiform movements 10/20/2015   Bilateral tympanic membrane perforation 09/24/2015   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   Weight loss 09/24/2015   Chronic respiratory failure with hypoxia ([place]) 09/14/2015   Cough    Acute respiratory distress 09/04/2015   Barrett's esophagus 09/04/2015   Dyspnea on exertion 09/04/2015   CAP (community acquired pneumonia) 09/03/2015   Upper airway cough syndrome 08/21/2015   Chest pain with moderate risk of acute coronary syndrome 05/13/2015   Bilateral inguinal hernia ([facility]) 01/08/2014   Ileus ([place]) 08/18/2012   Pain due to Left hip joint prosthesis 06/13/2011   Hyperparathyroidism, primary, s/p MI parathyroidectomy 7/18, 3.1 gm adenoma 02/19/2011   LIPOMAS, MULTIPLE 10/24/2007   HLD (hyperlipidemia) 10/24/2007   Depression 10/24/2007   ADD (attention deficit disorder) 10/24/2007   SINUSITIS, CHRONIC 10/24/2007   PNEUMONIA, RECURRENT 10/24/2007   HIP FRACTURE, LEFT 10/24/2007   GENITAL HERPES, HX OF 10/24/2007   Former smoker 10/24/2007   HIP REPLACEMENT, TOTAL, HX OF 10/24/2007   Other acquired  absence of organ 10/24/2007   G E R D 07/13/2007    PCP: Jonathon Jordan MD  REFERRING PROVIDER: Bo Merino, MD  REFERRING DIAG: M50.30 (ICD-10-CM) - DDD (degenerative disc disease), cervical  THERAPY DIAG:  Cervicalgia  Abnormal posture  Rationale for Evaluation and Treatment Rehabilitation  ONSET DATE: worse in last 6 months  SUBJECTIVE:  SUBJECTIVE STATEMENT: Pt indicated history of arthritis c worsening in last 6 months.  Pt indicated Rt side of neck hurting the most.  Pt indicated he can still do most things but does have pain.  Pt indicated crackling noted.    PERTINENT HISTORY:  COPD, GERD, history of melanoma, OA, history of Lt THA  PAIN:  NPRS scale: at worst 3/10, slight pain at rest Pain location: cervical  Pain description: achy, stiffness Aggravating factors: sitting back/keeping head up Relieving factors: nothing specific   PRECAUTIONS: None  WEIGHT BEARING RESTRICTIONS No  FALLS:  Has patient fallen in last 6 months? No  LIVING ENVIRONMENT: Lives with: lives with their spouse Lives in: House/apartment Stairs: none   OCCUPATION: Retired  PLOF: Independent, caregiver for wife, grandchildren care.  Rt hand dominant  PATIENT GOALS   Do something to improve pain/movement, prevent it from getting worse  OBJECTIVE:   PATIENT SURVEYS:  01/11/2022 FOTO intake: 63   predicted:   67  COGNITION: 01/11/2022 Overall cognitive status: Within functional limits for tasks assessed  SENSATION: 01/11/2022 WFL in UE  POSTURE:  01/11/2022 rounded shoulders, forward head, and increased thoracic kyphosis  PALPATION: 01/11/2022 mild tenderness to touch Rt cervical paraspinals mid and lower   CERVICAL ROM:   01/11/2022:  Rt lower cervical pain noted c  extension, Rt rotation Active ROM AROM (deg) 01/11/2022  Flexion 40  Extension 50  Right lateral flexion   Left lateral flexion   Right rotation 40  Left rotation 62   (Blank rows = not tested)  UPPER EXTREMITY ROM:   ROM Right 01/11/2022 Left 01/11/2022  Shoulder flexion    Shoulder extension    Shoulder abduction    Shoulder adduction    Shoulder extension    Shoulder internal rotation    Shoulder external rotation    Elbow flexion    Elbow extension    Wrist flexion    Wrist extension    Wrist ulnar deviation    Wrist radial deviation    Wrist pronation    Wrist supination     (Blank rows = not tested)  UPPER EXTREMITY MMT:  MMT Right 01/11/2022 Left 01/11/2022  Shoulder flexion 5/5 5/5  Shoulder extension    Shoulder abduction 5/5 5/5  Shoulder adduction    Shoulder extension    Shoulder internal rotation 5/5 5/5  Shoulder external rotation 5/5 5/5  Middle trapezius    Lower trapezius    Elbow flexion 5/5 5/5  Elbow extension 5/5 5/5  Wrist flexion    Wrist extension    Wrist ulnar deviation    Wrist radial deviation    Wrist pronation    Wrist supination    Grip strength     (Blank rows = not tested)  CERVICAL SPECIAL TESTS:  01/11/2022 Passive accessory hypomobility and concordant pain noted from Rt cervical inferior translatory glides C4-C7.  Hypomobility throughout upper and mid thoracic cPA.     TODAY'S TREATMENT:  01/11/2022 Therex:  HEP instruction/performance c cues for techniques, handout provided.  Trial set performed of each for comprehension and symptom assessment.  See below for exercise list.  Manual:  Supine cervical Rt downslope g3 mobs C5-C7.  Prone regional PA mobs mid thoracic g4   PATIENT EDUCATION:  01/11/2022 Education details: HEP, POC Person educated: Patient Education method: Explanation, Demonstration, Tactile cues, Verbal cues, and Handouts Education comprehension: verbalized understanding, returned demonstration, and  verbal cues required   HOME EXERCISE PROGRAM: Access Code: [email or web address] URL: [email or web address] Date:  01/11/2022 Prepared by: Scot Jun  Exercises - Supine Cervical Retraction with Towel  - 1-2 x daily - 7 x weekly - 1 sets - 10 reps - 5 hold - Seated Scapular Retraction  - 3-5 x daily - 7 x weekly - 1 sets - 10 reps - 3-5 hold - Supine Cervical Rotation AROM on Pillow  - 1-2 x daily - 7 x weekly - 1 sets - 10 reps - Seated Thoracic Lumbar Extension  - 1-2 x daily - 7 x weekly - 1 sets - 10 reps - Seated Cervical Rotation AROM  - 1-2 x daily - 7 x weekly - 1 sets - 10 reps - Supine Scapular Retraction  - 1-2 x daily - 7 x weekly - 1 sets - 10 reps - 5 hold  ASSESSMENT:  CLINICAL IMPRESSION: Patient is a 71 y.o. who comes to clinic with complaints of cervical pain with mobility, strength and movement coordination deficits that impair their ability to perform usual daily and recreational functional activities without increase difficulty/symptoms at this time.  Patient to benefit from skilled PT services to address impairments and limitations to improve to previous level of function without restriction secondary to condition.    OBJECTIVE IMPAIRMENTS decreased activity tolerance, decreased endurance, decreased mobility, decreased ROM, hypomobility, increased fascial restrictions, impaired perceived functional ability, impaired flexibility, improper body mechanics, postural dysfunction, and pain.   ACTIVITY LIMITATIONS bending, sitting, standing, sleeping, and caring for others  PARTICIPATION LIMITATIONS: meal prep, interpersonal relationship, driving, community activity, and caregiving  PERSONAL FACTORS  COPD, GERD, history of melanoma,   are also affecting patient's functional outcome.   REHAB POTENTIAL: Good  CLINICAL DECISION MAKING: Stable/uncomplicated  EVALUATION COMPLEXITY: Low   GOALS: Goals reviewed with patient? Yes  Short term PT Goals (target date  for Short term goals are 3 weeks 02/01/2022) Patient will demonstrate independent use of home exercise program to maintain progress from in clinic treatments. Goal status: New   Long term PT goals (target dates for all long term goals are 10 weeks  03/22/2022 )  1. Patient will demonstrate/report pain at worst less than or equal to 2/10 to facilitate minimal limitation in daily activity secondary to pain symptoms. Goal status: New  2. Patient will demonstrate independent use of home exercise program to facilitate ability to maintain/progress functional gains from skilled physical therapy services. Goal status: New  3. Patient will demonstrate FOTO outcome > or = 67 % to indicate reduced disability due to condition. Goal status: New  4.  Patient will demonstrate cervical AROM WFL s symptoms to facilitate daily activity including driving, self care at PLOF s limitation due to symptoms. Goal status: New  5.  Patient will demonstrate/report abiltity to perform driving s restriction due to head movements.   Goal status: New   PLAN: PT FREQUENCY: 1-2x/week  PT DURATION: 10 weeks  PLANNED INTERVENTIONS: Therapeutic exercises, Therapeutic activity, Neuro Muscular re-education, Balance training, Gait training, Patient/Family education, Joint mobilization, Stair training, DME instructions, Dry Needling, Electrical stimulation, Cryotherapy, Moist heat, Taping, Ultrasound, Ionotophoresis '4mg'$ /ml Dexamethasone, and Manual therapy.  All included unless contraindicated  PLAN FOR NEXT SESSION: Review HEP use and knowledge.   Manual for cervical and thoracic mobility improvements.    Scot Jun, PT, DPT, OCS, ATC 01/11/22  11:16 AM

## 2022-01-11 ENCOUNTER — Ambulatory Visit (INDEPENDENT_AMBULATORY_CARE_PROVIDER_SITE_OTHER): Payer: Medicare Other | Admitting: Rehabilitative and Restorative Service Providers"

## 2022-01-11 ENCOUNTER — Encounter: Payer: Self-pay | Admitting: Rehabilitative and Restorative Service Providers"

## 2022-01-11 ENCOUNTER — Other Ambulatory Visit: Payer: Self-pay

## 2022-01-11 DIAGNOSIS — R293 Abnormal posture: Secondary | ICD-10-CM

## 2022-01-11 DIAGNOSIS — M542 Cervicalgia: Secondary | ICD-10-CM | POA: Diagnosis not present

## 2022-01-13 ENCOUNTER — Other Ambulatory Visit (HOSPITAL_COMMUNITY): Payer: Self-pay | Admitting: Urology

## 2022-01-13 DIAGNOSIS — R972 Elevated prostate specific antigen [PSA]: Secondary | ICD-10-CM

## 2022-01-14 ENCOUNTER — Ambulatory Visit (INDEPENDENT_AMBULATORY_CARE_PROVIDER_SITE_OTHER): Payer: Medicare Other | Admitting: Rehabilitative and Restorative Service Providers"

## 2022-01-14 ENCOUNTER — Encounter: Payer: Self-pay | Admitting: Rehabilitative and Restorative Service Providers"

## 2022-01-14 DIAGNOSIS — R293 Abnormal posture: Secondary | ICD-10-CM | POA: Diagnosis not present

## 2022-01-14 DIAGNOSIS — M542 Cervicalgia: Secondary | ICD-10-CM | POA: Diagnosis not present

## 2022-01-14 NOTE — Therapy (Signed)
OUTPATIENT PHYSICAL THERAPY TREATMENT NOTE   Patient Name: DEVINE KLINGEL MRN: [id number] DOB:[birth year], 71 y.o., male Today's Date: 01/14/2022  REFERRING PROVIDER: Bo Merino, MD  END OF SESSION:   PT End of Session - 01/14/22 0810     Visit Number 2    Number of Visits 20    Date for PT Re-Evaluation 03/22/22    Authorization Type [employer]    Progress Note Due on Visit 10    PT Start Time 0758    PT Stop Time 0838    PT Time Calculation (min) 40 min    Activity Tolerance Patient tolerated treatment well    Behavior During Therapy [facility] for tasks assessed/performed             Past Medical History:  Diagnosis Date   Arthritis    Bronchitis    history of   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 last stress test 2003   Pneumonia 2009   Primary immune deficiency disorder ([place])    Tumor cells, benign    lung, left side   Unstable angina ([place]) 10/28/2017   Past Surgical History:  Procedure Laterality Date   APPENDECTOMY  1984   CATARACT EXTRACTION     L and R eye   CHOLECYSTECTOMY  2002   HYDROCELE EXCISION Right 11/04/2016   Procedure: HYDROCELECTOMY ADULT;  Surgeon: Franchot Gallo, MD;  Location: [facility];  Service: Urology;  Laterality: Right;   INGUINAL HERNIA REPAIR Bilateral 11/04/2016   Procedure: HERNIA REPAIR INGUINAL ADULT BILATERAL;  Surgeon: Jackolyn Confer, MD;  Location: [facility];  Service: General;  Laterality: Bilateral;   INSERTION OF MESH Bilateral 11/04/2016   Procedure: INSERTION OF MESH;  Surgeon: Jackolyn Confer, MD;  Location: [facility];  Service: General;  Laterality: Bilateral;   JOINT REPLACEMENT     Lt hip   LEFT HEART CATH AND CORONARY ANGIOGRAPHY N/A 10/31/2017   Procedure: LEFT HEART CATH AND CORONARY ANGIOGRAPHY;  Surgeon: Troy Sine, MD;  Location: [facility];  Service: Cardiovascular;  Laterality: N/A;   MASTOIDECTOMY  1996   NASAL SEPTUM SURGERY     sinus surgery   PARATHYROIDECTOMY     TOTAL HIP REVISION  06/14/2011   Procedure: TOTAL HIP REVISION;  Surgeon: Kerin Salen;  Location: [facility];  Service: Orthopedics;  Laterality: Left;  Left Acetabular  Hip Revision   Patient Active Problem List   Diagnosis Date Noted   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   Orthostatic dizziness 11/17/2021   Bilateral lower extremity edema 11/17/2021   Benign neoplasm of stomach 07/13/2021   Acute rhinosinusitis 06/03/2021   Cognitive change 08/05/2020   Acquired deformity of neck 06/23/2020   Allergic rhinitis 06/23/2020   Amnesia 06/23/2020   Cataract 06/23/2020   Cerebrovascular disease 06/23/2020   Closed fracture of one rib 06/23/2020   Coronary artery disease 06/23/2020   Dementia with behavioral disturbance ([place]) 06/23/2020   Dysgeusia 06/23/2020  Extrapyramidal and movement disorder, unspecified 06/23/2020   Gallstones 06/23/2020   Generalized lymphadenopathy 06/23/2020   Hardening of the aorta (main artery of the heart) ([place]) 06/23/2020   Herpes simplex viral infection 06/23/2020   Hypothyroid 06/23/2020   Insomnia 06/23/2020   Malignant melanoma of skin ([place]) 06/23/2020   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   Mitral valve disorder 06/23/2020   Monocytosis 06/23/2020   Neuropathic pain 06/23/2020   Prediabetes 06/23/2020   Renal cyst 06/23/2020   Senile purpura ([place]) 06/23/2020   Solitary pulmonary nodule 06/23/2020   Spinal stenosis in cervical region 06/23/2020   Bronchiectasis without complication ([place]) 51/88/4166   Centrilobular emphysema  ([place]) 03/25/2020   Abnormal findings on diagnostic imaging of lung 03/25/2020   Acute exacerbation of COPD with asthma ([place]) 02/13/2020   Abnormal CT of the chest 07/30/2019   Elevated rheumatoid factor 07/30/2019   Oral candidiasis 07/30/2019   Sinus tachycardia 04/03/2019   Chronic otorrhea of left ear 03/28/2019   Confusion 01/02/2019   Bilateral impacted cerumen 01/31/2018   Abnormal EKG-dynamic TWI 10/31/2017   PVC's (premature ventricular contractions) 10/31/2017   Family history of coronary artery disease in father 10/31/2017   Unstable angina ([place]) 10/28/2017   Bilateral inguinal hernia 11/04/2016   Neck pain 10/14/2016   Mobitz type 1 second degree atrioventricular block 03/16/2016   Syncope 03/15/2016   Acute suppurative otitis media of left ear without spontaneous rupture of tympanic membrane 12/24/2015   Malnutrition of moderate degree 10/21/2015   Hyperkinetic disorder    Choreiform movements 10/20/2015   Bilateral tympanic membrane perforation 09/24/2015   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   Weight loss 09/24/2015   Chronic respiratory failure with hypoxia ([place]) 09/14/2015   Cough    Acute respiratory distress 09/04/2015   Barrett's esophagus 09/04/2015   Dyspnea on exertion 09/04/2015   CAP (community acquired pneumonia) 09/03/2015   Upper airway cough syndrome 08/21/2015   Chest pain with moderate risk of acute coronary syndrome 05/13/2015   Bilateral inguinal hernia (BI[facility]) 01/08/2014   Ileus ([place]) 08/18/2012   Pain due to Left hip joint prosthesis 06/13/2011   Hyperparathyroidism, primary, s/p MI parathyroidectomy 7/18, 3.1 gm adenoma 02/19/2011   LIPOMAS, MULTIPLE 10/24/2007   HLD (hyperlipidemia) 10/24/2007   Depression 10/24/2007   ADD (attention deficit disorder) 10/24/2007   SINUSITIS, CHRONIC 10/24/2007   PNEUMONIA, RECURRENT 10/24/2007   HIP FRACTURE, LEFT 10/24/2007   GENITAL HERPES, HX OF 10/24/2007   Former smoker  10/24/2007   HIP REPLACEMENT, TOTAL, HX OF 10/24/2007   Other acquired absence of organ 10/24/2007   G E R D 07/13/2007    REFERRING DIAG: M50.30 (ICD-10-CM) - DDD (degenerative disc disease), cervical  ONSET DATE: worse in last 6 months  THERAPY DIAG:  Cervicalgia  Abnormal posture  Rationale for Evaluation and Treatment Rehabilitation  PERTINENT HISTORY: COPD, GERD, history of melanoma, OA, history of Lt THA  PRECAUTIONS: None  SUBJECTIVE:  SUBJECTIVE STATEMENT: Pt indicated no pain today upon arrival.  Pt indicated having pain c turning head to right at 6/10.    PERTINENT HISTORY:  COPD, GERD, history of melanoma, OA, history of Lt THA   PAIN:  NPRS scale: at worst 3/10, slight pain at rest Pain location: cervical  Pain description: achy, stiffness Aggravating factors: sitting back/keeping head up Relieving factors: nothing specific    PRECAUTIONS: None   WEIGHT BEARING RESTRICTIONS No   FALLS:  Has patient fallen in last 6 months? No   LIVING ENVIRONMENT: Lives with: lives with their spouse Lives in: House/apartment Stairs: none     OCCUPATION: Retired   PLOF: Independent, caregiver for wife, grandchildren care.  Rt hand dominant   PATIENT GOALS   Do something to improve pain/movement, prevent it from getting worse   OBJECTIVE:    PATIENT SURVEYS:  01/11/2022 FOTO intake: 63   predicted:   67   COGNITION: 01/11/2022 Overall cognitive status: Within functional limits for tasks assessed   SENSATION: 01/11/2022 WFL in UE   POSTURE:  01/11/2022 rounded shoulders, forward head, and increased thoracic kyphosis   PALPATION: 01/11/2022 mild tenderness to touch Rt cervical paraspinals mid and lower    CERVICAL ROM:             01/11/2022:  Rt lower cervical  pain noted c extension, Rt rotation Active ROM AROM (deg) 01/11/2022 AROM 01/14/2022  Flexion 40   Extension 50   Right lateral flexion     Left lateral flexion     Right rotation 40 60  Left rotation 62    (Blank rows = not tested)   UPPER EXTREMITY ROM:    ROM Right 01/11/2022 Left 01/11/2022  Shoulder flexion      Shoulder extension      Shoulder abduction      Shoulder adduction      Shoulder extension      Shoulder internal rotation      Shoulder external rotation      Elbow flexion      Elbow extension      Wrist flexion      Wrist extension      Wrist ulnar deviation      Wrist radial deviation      Wrist pronation      Wrist supination       (Blank rows = not tested)   UPPER EXTREMITY MMT:   MMT Right 01/11/2022 Left 01/11/2022  Shoulder flexion 5/5 5/5  Shoulder extension      Shoulder abduction 5/5 5/5  Shoulder adduction      Shoulder extension      Shoulder internal rotation 5/5 5/5  Shoulder external rotation 5/5 5/5  Middle trapezius      Lower trapezius      Elbow flexion 5/5 5/5  Elbow extension 5/5 5/5  Wrist flexion      Wrist extension      Wrist ulnar deviation      Wrist radial deviation      Wrist pronation      Wrist supination      Grip strength       (Blank rows = not tested)   CERVICAL SPECIAL TESTS:  01/11/2022 Passive accessory hypomobility and concordant pain noted from Rt cervical inferior translatory glides C4-C7.  Hypomobility throughout upper and mid thoracic cPA.       TODAY'S TREATMENT:  01/14/2022:  Therex:   Prone scapular retraction hold 5 sec x 10  Prone scapular retraction hold c GH ext 5 sec x 10    Supine cervical retraction into pillow 5 sec hold x 10    Seated thoracic extension over chair hold x 15    Standing tband rows green x20   Standing tband gh ext green x 20    Nustep UE/LE lvl 6 6 mins      Manual:    Prone cPA T1-T8 g4, Prone Rt uPA C4-C7  01/11/2022 Therex:             HEP  instruction/performance c cues for techniques, handout provided.  Trial set performed of each for comprehension and symptom assessment.  See below for exercise list.   Manual:             Supine cervical Rt downslope g3 mobs C5-C7.  Prone regional PA mobs mid thoracic g4     PATIENT EDUCATION:  01/14/2022 Education details: HEP progression Person educated: Patient Education method: Explanation, Demonstration, Tactile cues, Verbal cues, and Handouts Education comprehension: verbalized understanding, returned demonstration, and verbal cues required     HOME EXERCISE PROGRAM: Access Code: [email or web address] URL: [email or web address] Date: 01/14/2022 Prepared by: Scot Jun  Exercises - Supine Cervical Retraction with Towel  - 1-2 x daily - 7 x weekly - 1 sets - 10 reps - 5 hold - Seated Scapular Retraction  - 3-5 x daily - 7 x weekly - 1 sets - 10 reps - 3-5 hold - Supine Cervical Rotation AROM on Pillow  - 1-2 x daily - 7 x weekly - 1 sets - 10 reps - Seated Thoracic Lumbar Extension  - 1-2 x daily - 7 x weekly - 1 sets - 10 reps - Seated Cervical Rotation AROM  - 1-2 x daily - 7 x weekly - 1 sets - 10 reps - Supine Scapular Retraction  - 1-2 x daily - 7 x weekly - 1 sets - 10 reps - 5 hold - Standing Shoulder Row with Anchored Resistance  - 2 x daily - 7 x weekly - 3 sets - 10 reps - Shoulder Extension with Resistance  - 2 x daily - 7 x weekly - 3 sets - 10 reps - Prone Scapular Retraction  - 1 x daily - 7 x weekly - 1 sets - 10 reps - 5 hold - Prone Scapular Slide with Shoulder Extension  - 1 x daily - 7 x weekly - 1 sets - 10 reps - 5 hold   ASSESSMENT:   CLINICAL IMPRESSION: Mild improvement in passive accessory mobility today compared to evaluation.  Pt maintained post treatment gains in rotation from last visit, making improvements towards goals.   Continued skilled PT services indicated at this time to continue progression.      OBJECTIVE IMPAIRMENTS decreased  activity tolerance, decreased endurance, decreased mobility, decreased ROM, hypomobility, increased fascial restrictions, impaired perceived functional ability, impaired flexibility, improper body mechanics, postural dysfunction, and pain.    ACTIVITY LIMITATIONS bending, sitting, standing, sleeping, and caring for others   PARTICIPATION LIMITATIONS: meal prep, interpersonal relationship, driving, community activity, and caregiving   PERSONAL FACTORS  COPD, GERD, history of melanoma,   are also affecting patient's functional outcome.    REHAB POTENTIAL: Good   CLINICAL DECISION MAKING: Stable/uncomplicated   EVALUATION COMPLEXITY: Low     GOALS: Goals reviewed with patient? Yes   Short term PT Goals (target date for Short term goals are 3 weeks 02/01/2022) Patient will demonstrate independent use of home exercise program to maintain  progress from in clinic treatments. Goal status: on going - assessed 01/15/2012   Long term PT goals (target dates for all long term goals are 10 weeks  03/22/2022 )   1. Patient will demonstrate/report pain at worst less than or equal to 2/10 to facilitate minimal limitation in daily activity secondary to pain symptoms. Goal status: New   2. Patient will demonstrate independent use of home exercise program to facilitate ability to maintain/progress functional gains from skilled physical therapy services. Goal status: New   3. Patient will demonstrate FOTO outcome > or = 67 % to indicate reduced disability due to condition. Goal status: New   4.  Patient will demonstrate cervical AROM WFL s symptoms to facilitate daily activity including driving, self care at PLOF s limitation due to symptoms. Goal status: New   5.  Patient will demonstrate/report abiltity to perform driving s restriction due to head movements.   Goal status: New     PLAN: PT FREQUENCY: 1-2x/week   PT DURATION: 10 weeks   PLANNED INTERVENTIONS: Therapeutic exercises, Therapeutic  activity, Neuro Muscular re-education, Balance training, Gait training, Patient/Family education, Joint mobilization, Stair training, DME instructions, Dry Needling, Electrical stimulation, Cryotherapy, Moist heat, Taping, Ultrasound, Ionotophoresis '4mg'$ /ml Dexamethasone, and Manual therapy.  All included unless contraindicated   PLAN FOR NEXT SESSION: Manual for cervical and thoracic mobility improvements continued.    Scot Jun, PT, DPT, OCS, ATC 01/14/22  8:39 AM

## 2022-01-20 ENCOUNTER — Ambulatory Visit (INDEPENDENT_AMBULATORY_CARE_PROVIDER_SITE_OTHER): Payer: Medicare Other | Admitting: Rehabilitative and Restorative Service Providers"

## 2022-01-20 ENCOUNTER — Encounter: Payer: Self-pay | Admitting: Rehabilitative and Restorative Service Providers"

## 2022-01-20 DIAGNOSIS — M542 Cervicalgia: Secondary | ICD-10-CM

## 2022-01-20 DIAGNOSIS — R293 Abnormal posture: Secondary | ICD-10-CM

## 2022-01-20 NOTE — Therapy (Signed)
OUTPATIENT PHYSICAL THERAPY TREATMENT NOTE   Patient Name: [name] MRN: [id number] DOB:1951-03-08, 71 y.o., male Today's Date: 01/20/2022  REFERRING PROVIDER: Bo Merino, MD  END OF SESSION:   PT End of Session - 01/20/22 1257     Visit Number 3    Number of Visits 20    Date for PT Re-Evaluation 03/22/22    Authorization Type [employer]    Progress Note Due on Visit 10    PT Start Time 1257    PT Stop Time 1336    PT Time Calculation (min) 39 min    Activity Tolerance Patient tolerated treatment well    Behavior During Therapy WFL for tasks assessed/performed              Past Medical History:  Diagnosis Date   Arthritis    Bronchitis    history of   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 last stress test 2003   Pneumonia 2009   Primary immune deficiency disorder ([place])    Tumor cells, benign    lung, left side   Unstable angina ([place]) 10/28/2017   Past Surgical History:  Procedure Laterality Date   APPENDECTOMY  1984   CATARACT EXTRACTION     L and R eye   CHOLECYSTECTOMY  2002   HYDROCELE EXCISION Right 11/04/2016   Procedure: HYDROCELECTOMY ADULT;  Surgeon: Franchot Gallo, MD;  Location: [facility];  Service: Urology;  Laterality: Right;   INGUINAL HERNIA REPAIR Bilateral 11/04/2016   Procedure: HERNIA REPAIR INGUINAL ADULT BILATERAL;  Surgeon: Jackolyn Confer, MD;  Location: [facility];  Service: General;  Laterality: Bilateral;   INSERTION OF MESH Bilateral 11/04/2016   Procedure: INSERTION OF MESH;  Surgeon: Jackolyn Confer, MD;  Location: [facility];  Service: General;  Laterality: Bilateral;   JOINT REPLACEMENT     Lt hip   LEFT HEART CATH AND CORONARY ANGIOGRAPHY N/A 10/31/2017   Procedure: LEFT HEART CATH AND CORONARY ANGIOGRAPHY;  Surgeon: Troy Sine, MD;  Location: [facility];  Service: Cardiovascular;  Laterality: N/A;   MASTOIDECTOMY  1996   NASAL SEPTUM SURGERY     sinus surgery   PARATHYROIDECTOMY     TOTAL HIP REVISION  06/14/2011   Procedure: TOTAL HIP REVISION;  Surgeon: Kerin Salen;  Location: [facility];  Service: Orthopedics;  Laterality: Left;  Left Acetabular  Hip Revision   Patient Active Problem List   Diagnosis Date Noted   COPD with asthma ([place] [facility]) 12/01/2021   Orthostatic dizziness 11/17/2021   Bilateral lower extremity edema 11/17/2021   Benign neoplasm of stomach 07/13/2021   Acute rhinosinusitis 06/03/2021   Cognitive change 08/05/2020   Acquired deformity of neck 06/23/2020   Allergic rhinitis 06/23/2020   Amnesia 06/23/2020   Cataract 06/23/2020   Cerebrovascular disease 06/23/2020   Closed fracture of one rib 06/23/2020   Coronary artery disease 06/23/2020   Dementia with behavioral disturbance ([place]) 06/23/2020   Dysgeusia 06/23/2020  Extrapyramidal and movement disorder, unspecified 06/23/2020   Gallstones 06/23/2020   Generalized lymphadenopathy 06/23/2020   Hardening of the aorta (main artery of the heart) ([place]) 06/23/2020   Herpes simplex viral infection 06/23/2020   Hypothyroid 06/23/2020   Insomnia 06/23/2020   Malignant melanoma of skin ([place]) 06/23/2020   Mild recurrent major depression ([place]) 06/23/2020   Mitral valve disorder 06/23/2020   Monocytosis 06/23/2020   Neuropathic pain 06/23/2020   Prediabetes 06/23/2020   Renal cyst 06/23/2020   Senile purpura ([place]) 06/23/2020   Solitary pulmonary nodule 06/23/2020   Spinal stenosis in cervical region 06/23/2020   Bronchiectasis without complication ([place]) 33/54/5625   Centrilobular emphysema  ([place]) 03/25/2020   Abnormal findings on diagnostic imaging of lung 03/25/2020   Acute exacerbation of COPD with asthma ([place]) 02/13/2020   Abnormal CT of the chest 07/30/2019   Elevated rheumatoid factor 07/30/2019   Oral candidiasis 07/30/2019   Sinus tachycardia 04/03/2019   Chronic otorrhea of left ear 03/28/2019   Confusion 01/02/2019   Bilateral impacted cerumen 01/31/2018   Abnormal EKG-dynamic TWI 10/31/2017   PVC's (premature ventricular contractions) 10/31/2017   Family history of coronary artery disease in father 10/31/2017   Unstable angina ([place]) 10/28/2017   Bilateral inguinal hernia 11/04/2016   Neck pain 10/14/2016   Mobitz type 1 second degree atrioventricular block 03/16/2016   Syncope 03/15/2016   Acute suppurative otitis media of left ear without spontaneous rupture of tympanic membrane 12/24/2015   Malnutrition of moderate degree 10/21/2015   Hyperkinetic disorder    Choreiform movements 10/20/2015   Bilateral tympanic membrane perforation 09/24/2015   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   Weight loss 09/24/2015   Chronic respiratory failure with hypoxia ([place]) 09/14/2015   Cough    Acute respiratory distress 09/04/2015   Barrett's esophagus 09/04/2015   Dyspnea on exertion 09/04/2015   CAP (community acquired pneumonia) 09/03/2015   Upper airway cough syndrome 08/21/2015   Chest pain with moderate risk of acute coronary syndrome 05/13/2015   Bilateral inguinal hernia ([facility]) 01/08/2014   Ileus ([place]) 08/18/2012   Pain due to Left hip joint prosthesis 06/13/2011   Hyperparathyroidism, primary, s/p MI parathyroidectomy 7/18, 3.1 gm adenoma 02/19/2011   LIPOMAS, MULTIPLE 10/24/2007   HLD (hyperlipidemia) 10/24/2007   Depression 10/24/2007   ADD (attention deficit disorder) 10/24/2007   SINUSITIS, CHRONIC 10/24/2007   PNEUMONIA, RECURRENT 10/24/2007   HIP FRACTURE, LEFT 10/24/2007   GENITAL HERPES, HX OF 10/24/2007   Former smoker  10/24/2007   HIP REPLACEMENT, TOTAL, HX OF 10/24/2007   Other acquired absence of organ 10/24/2007   G E R D 07/13/2007    REFERRING DIAG: M50.30 (ICD-10-CM) - DDD (degenerative disc disease), cervical  ONSET DATE: worse in last 6 months  THERAPY DIAG:  Cervicalgia  Abnormal posture  Rationale for Evaluation and Treatment Rehabilitation  PERTINENT HISTORY: COPD, GERD, history of melanoma, OA, history of Lt THA  PRECAUTIONS: None  SUBJECTIVE:  SUBJECTIVE STATEMENT: Pt indicated chief complaint of difficulty getting head back to recliner or when lying down.  No real pain at rest, some twinges noted at times throughout day with movement.    PERTINENT HISTORY:  COPD, GERD, history of melanoma, OA, history of Lt THA   PAIN:  NPRS scale: no pain at rest Pain location: cervical  Pain description: achy, stiffness Aggravating factors: sitting back/lying flat Relieving factors: nothing specific    PRECAUTIONS: None   WEIGHT BEARING RESTRICTIONS No   FALLS:  Has patient fallen in last 6 months? No   LIVING ENVIRONMENT: Lives with: lives with their spouse Lives in: House/apartment Stairs: none     OCCUPATION: Retired   PLOF: Independent, caregiver for wife, grandchildren care.  Rt hand dominant   PATIENT GOALS   Do something to improve pain/movement, prevent it from getting worse   OBJECTIVE:    PATIENT SURVEYS:  01/11/2022 FOTO intake: 63   predicted:   67   COGNITION: 01/11/2022 Overall cognitive status: Within functional limits for tasks assessed   SENSATION: 01/11/2022 WFL in UE   POSTURE:  01/11/2022 rounded shoulders, forward head, and increased thoracic kyphosis   PALPATION: 01/11/2022 mild tenderness to touch Rt cervical paraspinals mid and lower    CERVICAL  ROM:             01/11/2022:  Rt lower cervical pain noted c extension, Rt rotation Active ROM AROM (deg) 01/11/2022 AROM 01/14/2022 AROM 01/20/2022  Flexion 40  50  Extension 50  55  Right lateral flexion      Left lateral flexion      Right rotation 40 60 68  Left rotation 62  78   (Blank rows = not tested)   UPPER EXTREMITY ROM:    ROM Right 01/11/2022 Left 01/11/2022  Shoulder flexion      Shoulder extension      Shoulder abduction      Shoulder adduction      Shoulder extension      Shoulder internal rotation      Shoulder external rotation      Elbow flexion      Elbow extension      Wrist flexion      Wrist extension      Wrist ulnar deviation      Wrist radial deviation      Wrist pronation      Wrist supination       (Blank rows = not tested)   UPPER EXTREMITY MMT:   MMT Right 01/11/2022 Left 01/11/2022  Shoulder flexion 5/5 5/5  Shoulder extension      Shoulder abduction 5/5 5/5  Shoulder adduction      Shoulder extension      Shoulder internal rotation 5/5 5/5  Shoulder external rotation 5/5 5/5  Middle trapezius      Lower trapezius      Elbow flexion 5/5 5/5  Elbow extension 5/5 5/5  Wrist flexion      Wrist extension      Wrist ulnar deviation      Wrist radial deviation      Wrist pronation      Wrist supination      Grip strength       (Blank rows = not tested)   CERVICAL SPECIAL TESTS:  01/20/2022:  standing head distance from wall : 2.5 inches  01/11/2022 Passive accessory hypomobility and concordant pain noted from Rt cervical inferior translatory glides C4-C7.  Hypomobility throughout upper and  mid thoracic cPA.       TODAY'S TREATMENT:  01/20/2022:  Therex:   Prone scapular retraction hold 5 sec x 10    Prone scapular retraction hold c GH ext 5 sec x 10       Seated thoracic extension over chair hold x 15   Standing back against wall cervical retraction into yellow ball 5 sec x 10    Standing at wall thoracic rotations c horizontal  abduction x 10 bilateral   Standing tband rows green 2 x 15      UBE fwd/back 3 mins each way lvl 2.0     Manual:    Prone cPA T1-T8 g4  01/14/2022:  Therex:   Prone scapular retraction hold 5 sec x 10    Prone scapular retraction hold c GH ext 5 sec x 10    Supine cervical retraction into pillow 5 sec hold x 10    Seated thoracic extension over chair hold x 15    Standing tband rows green x20   Standing tband gh ext green x 20    Nustep UE/LE lvl 6 6 mins      Manual:    Prone cPA T1-T8 g4, Prone Rt uPA C4-C7  01/11/2022 Therex:             HEP instruction/performance c cues for techniques, handout provided.  Trial set performed of each for comprehension and symptom assessment.  See below for exercise list.   Manual:             Supine cervical Rt downslope g3 mobs C5-C7.  Prone regional PA mobs mid thoracic g4     PATIENT EDUCATION:  01/14/2022 Education details: HEP progression Person educated: Patient Education method: Explanation, Demonstration, Tactile cues, Verbal cues, and Handouts Education comprehension: verbalized understanding, returned demonstration, and verbal cues required     HOME EXERCISE PROGRAM: Access Code: [email or web address] URL: [email or web address] Date: 01/14/2022 Prepared by: Scot Jun  Exercises - Supine Cervical Retraction with Towel  - 1-2 x daily - 7 x weekly - 1 sets - 10 reps - 5 hold - Seated Scapular Retraction  - 3-5 x daily - 7 x weekly - 1 sets - 10 reps - 3-5 hold - Supine Cervical Rotation AROM on Pillow  - 1-2 x daily - 7 x weekly - 1 sets - 10 reps - Seated Thoracic Lumbar Extension  - 1-2 x daily - 7 x weekly - 1 sets - 10 reps - Seated Cervical Rotation AROM  - 1-2 x daily - 7 x weekly - 1 sets - 10 reps - Supine Scapular Retraction  - 1-2 x daily - 7 x weekly - 1 sets - 10 reps - 5 hold - Standing Shoulder Row with Anchored Resistance  - 2 x daily - 7 x weekly - 3 sets - 10 reps - Shoulder Extension with Resistance   - 2 x daily - 7 x weekly - 3 sets - 10 reps - Prone Scapular Retraction  - 1 x daily - 7 x weekly - 1 sets - 10 reps - 5 hold - Prone Scapular Slide with Shoulder Extension  - 1 x daily - 7 x weekly - 1 sets - 10 reps - 5 hold   ASSESSMENT:   CLINICAL IMPRESSION: Seated complaints most limited by thoracic posture positioning (increased kyphosis) and mobility restrictions in area.  Detailed relief options (pillows to support) as well as options for progressive stretching (reduce some support for a few minutes).  Cervical range of motion continued to show improvements.  Continued skilled PT services indicated at this time to continue progression.      OBJECTIVE IMPAIRMENTS decreased activity tolerance, decreased endurance, decreased mobility, decreased ROM, hypomobility, increased fascial restrictions, impaired perceived functional ability, impaired flexibility, improper body mechanics, postural dysfunction, and pain.    ACTIVITY LIMITATIONS bending, sitting, standing, sleeping, and caring for others   PARTICIPATION LIMITATIONS: meal prep, interpersonal relationship, driving, community activity, and caregiving   PERSONAL FACTORS  COPD, GERD, history of melanoma,   are also affecting patient's functional outcome.    REHAB POTENTIAL: Good   CLINICAL DECISION MAKING: Stable/uncomplicated   EVALUATION COMPLEXITY: Low     GOALS: Goals reviewed with patient? Yes   Short term PT Goals (target date for Short term goals are 3 weeks 02/01/2022) Patient will demonstrate independent use of home exercise program to maintain progress from in clinic treatments. Goal status: on going - assessed 01/15/2012   Long term PT goals (target dates for all long term goals are 10 weeks  03/22/2022 )   1. Patient will demonstrate/report pain at worst less than or equal to 2/10 to facilitate minimal limitation in daily activity secondary to pain symptoms. Goal status: New   2. Patient will demonstrate  independent use of home exercise program to facilitate ability to maintain/progress functional gains from skilled physical therapy services. Goal status: New   3. Patient will demonstrate FOTO outcome > or = 67 % to indicate reduced disability due to condition. Goal status: New   4.  Patient will demonstrate cervical AROM WFL s symptoms to facilitate daily activity including driving, self care at PLOF s limitation due to symptoms. Goal status: New   5.  Patient will demonstrate/report abiltity to perform driving s restriction due to head movements.   Goal status: New     PLAN: PT FREQUENCY: 1-2x/week   PT DURATION: 10 weeks   PLANNED INTERVENTIONS: Therapeutic exercises, Therapeutic activity, Neuro Muscular re-education, Balance training, Gait training, Patient/Family education, Joint mobilization, Stair training, DME instructions, Dry Needling, Electrical stimulation, Cryotherapy, Moist heat, Taping, Ultrasound, Ionotophoresis '4mg'$ /ml Dexamethasone, and Manual therapy.  All included unless contraindicated   PLAN FOR NEXT SESSION: Thoracic mobility improvements   Scot Jun, PT, DPT, OCS, ATC 01/20/22  1:36 PM

## 2022-01-22 ENCOUNTER — Ambulatory Visit (INDEPENDENT_AMBULATORY_CARE_PROVIDER_SITE_OTHER): Payer: Medicare Other | Admitting: Rehabilitative and Restorative Service Providers"

## 2022-01-22 ENCOUNTER — Encounter: Payer: Self-pay | Admitting: Rehabilitative and Restorative Service Providers"

## 2022-01-22 DIAGNOSIS — M542 Cervicalgia: Secondary | ICD-10-CM

## 2022-01-22 DIAGNOSIS — R293 Abnormal posture: Secondary | ICD-10-CM | POA: Diagnosis not present

## 2022-01-22 NOTE — Therapy (Signed)
OUTPATIENT PHYSICAL THERAPY TREATMENT NOTE   Patient Name: [name] MRN: [id number] DOB:16-Dec-1950, 71 y.o., male Today's Date: 01/22/2022  REFERRING PROVIDER: Bo Merino, MD  END OF SESSION:   PT End of Session - 01/22/22 1131     Visit Number 4    Number of Visits 20    Date for PT Re-Evaluation 03/22/22    Authorization Type [employer]    Progress Note Due on Visit 10    PT Start Time 1105    PT Stop Time 1144    PT Time Calculation (min) 39 min    Activity Tolerance Patient tolerated treatment well    Behavior During Therapy WFL for tasks assessed/performed               Past Medical History:  Diagnosis Date   Arthritis    Bronchitis    history of   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 last stress test 2003   Pneumonia 2009   Primary immune deficiency disorder ([place])    Tumor cells, benign    lung, left side   Unstable angina ([place]) 10/28/2017   Past Surgical History:  Procedure Laterality Date   APPENDECTOMY  1984   CATARACT EXTRACTION     L and R eye   CHOLECYSTECTOMY  2002   HYDROCELE EXCISION Right 11/04/2016   Procedure: HYDROCELECTOMY ADULT;  Surgeon: Franchot Gallo, MD;  Location: [facility];  Service: Urology;  Laterality: Right;   INGUINAL HERNIA REPAIR Bilateral 11/04/2016   Procedure: HERNIA REPAIR INGUINAL ADULT BILATERAL;  Surgeon: Jackolyn Confer, MD;  Location: [facility];  Service: General;  Laterality: Bilateral;   INSERTION OF MESH Bilateral 11/04/2016   Procedure: INSERTION OF MESH;  Surgeon: Jackolyn Confer, MD;  Location: [facility];  Service: General;  Laterality: Bilateral;   JOINT REPLACEMENT     Lt hip   LEFT HEART CATH AND CORONARY ANGIOGRAPHY N/A 10/31/2017   Procedure: LEFT HEART CATH AND CORONARY ANGIOGRAPHY;  Surgeon: Troy Sine, MD;  Location: [facility];  Service: Cardiovascular;  Laterality: N/A;   MASTOIDECTOMY  1996   NASAL SEPTUM SURGERY     sinus surgery   PARATHYROIDECTOMY     TOTAL HIP REVISION  06/14/2011   Procedure: TOTAL HIP REVISION;  Surgeon: Kerin Salen;  Location: [facility];  Service: Orthopedics;  Laterality: Left;  Left Acetabular  Hip Revision   Patient Active Problem List   Diagnosis Date Noted   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   Orthostatic dizziness 11/17/2021   Bilateral lower extremity edema 11/17/2021   Benign neoplasm of stomach 07/13/2021   Acute rhinosinusitis 06/03/2021   Cognitive change 08/05/2020   Acquired deformity of neck 06/23/2020   Allergic rhinitis 06/23/2020   Amnesia 06/23/2020   Cataract 06/23/2020   Cerebrovascular disease 06/23/2020   Closed fracture of one rib 06/23/2020   Coronary artery disease 06/23/2020   Dementia with behavioral disturbance ([place]) 06/23/2020   Dysgeusia  06/23/2020   Extrapyramidal and movement disorder, unspecified 06/23/2020   Gallstones 06/23/2020   Generalized lymphadenopathy 06/23/2020   Hardening of the aorta (main artery of the heart) ([place]) 06/23/2020   Herpes simplex viral infection 06/23/2020   Hypothyroid 06/23/2020   Insomnia 06/23/2020   Malignant melanoma of skin ([place]) 06/23/2020   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   Mitral valve disorder 06/23/2020   Monocytosis 06/23/2020   Neuropathic pain 06/23/2020   Prediabetes 06/23/2020   Renal cyst 06/23/2020   Senile purpura ([place]) 06/23/2020   Solitary pulmonary nodule 06/23/2020   Spinal stenosis in cervical region 06/23/2020   Bronchiectasis without complication ([place]) 26/83/4196   Centrilobular emphysema  ([place]) 03/25/2020   Abnormal findings on diagnostic imaging of lung 03/25/2020   Acute exacerbation of COPD with asthma ([place]) 02/13/2020   Abnormal CT of the chest 07/30/2019   Elevated rheumatoid factor 07/30/2019   Oral candidiasis 07/30/2019   Sinus tachycardia 04/03/2019   Chronic otorrhea of left ear 03/28/2019   Confusion 01/02/2019   Bilateral impacted cerumen 01/31/2018   Abnormal EKG-dynamic TWI 10/31/2017   PVC's (premature ventricular contractions) 10/31/2017   Family history of coronary artery disease in father 10/31/2017   Unstable angina ([place]) 10/28/2017   Bilateral inguinal hernia 11/04/2016   Neck pain 10/14/2016   Mobitz type 1 second degree atrioventricular block 03/16/2016   Syncope 03/15/2016   Acute suppurative otitis media of left ear without spontaneous rupture of tympanic membrane 12/24/2015   Malnutrition of moderate degree 10/21/2015   Hyperkinetic disorder    Choreiform movements 10/20/2015   Bilateral tympanic membrane perforation 09/24/2015   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   Weight loss 09/24/2015   Chronic respiratory failure with hypoxia ([place]) 09/14/2015   Cough    Acute respiratory distress 09/04/2015   Barrett's esophagus 09/04/2015   Dyspnea on exertion 09/04/2015   CAP (community acquired pneumonia) 09/03/2015   Upper airway cough syndrome 08/21/2015   Chest pain with moderate risk of acute coronary syndrome 05/13/2015   Bilateral inguinal hernia ([facility]) 01/08/2014   Ileus ([place]) 08/18/2012   Pain due to Left hip joint prosthesis 06/13/2011   Hyperparathyroidism, primary, s/p MI parathyroidectomy 7/18, 3.1 gm adenoma 02/19/2011   LIPOMAS, MULTIPLE 10/24/2007   HLD (hyperlipidemia) 10/24/2007   Depression 10/24/2007   ADD (attention deficit disorder) 10/24/2007   SINUSITIS, CHRONIC 10/24/2007   PNEUMONIA, RECURRENT 10/24/2007   HIP FRACTURE, LEFT 10/24/2007   GENITAL HERPES, HX OF 10/24/2007   Former smoker  10/24/2007   HIP REPLACEMENT, TOTAL, HX OF 10/24/2007   Other acquired absence of organ 10/24/2007   G E R D 07/13/2007    REFERRING DIAG: M50.30 (ICD-10-CM) - DDD (degenerative disc disease), cervical  ONSET DATE: worse in last 6 months  THERAPY DIAG:  Cervicalgia  Abnormal posture  Rationale for Evaluation and Treatment Rehabilitation  PERTINENT HISTORY: COPD, GERD, history of melanoma, OA, history of Lt THA  PRECAUTIONS: None  SUBJECTIVE:  SUBJECTIVE STATEMENT: Pt indicated no specific pain complaints upon arrival today.  Pt indicated progressively getting some improvement in extension back to chair.    PERTINENT HISTORY:  COPD, GERD, history of melanoma, OA, history of Lt THA   PAIN:  NPRS scale: no pain at rest Pain location: cervical  Pain description: achy, stiffness Aggravating factors: sitting back/lying flat Relieving factors: nothing specific    PRECAUTIONS: None   WEIGHT BEARING RESTRICTIONS No   FALLS:  Has patient fallen in last 6 months? No   LIVING ENVIRONMENT: Lives with: lives with their spouse Lives in: House/apartment Stairs: none     OCCUPATION: Retired   PLOF: Independent, caregiver for wife, grandchildren care.  Rt hand dominant   PATIENT GOALS   Do something to improve pain/movement, prevent it from getting worse   OBJECTIVE:    PATIENT SURVEYS:  01/11/2022 FOTO intake: 63   predicted:   67   COGNITION: 01/11/2022 Overall cognitive status: Within functional limits for tasks assessed   SENSATION: 01/11/2022 WFL in UE   POSTURE:  01/11/2022 rounded shoulders, forward head, and increased thoracic kyphosis   PALPATION: 01/11/2022 mild tenderness to touch Rt cervical paraspinals mid and lower    CERVICAL ROM:             01/11/2022:   Rt lower cervical pain noted c extension, Rt rotation Active ROM AROM (deg) 01/11/2022 AROM 01/14/2022 AROM 01/20/2022  Flexion 40  50  Extension 50  55  Right lateral flexion      Left lateral flexion      Right rotation 40 60 68  Left rotation 62  78   (Blank rows = not tested)   UPPER EXTREMITY ROM:    ROM Right 01/11/2022 Left 01/11/2022  Shoulder flexion      Shoulder extension      Shoulder abduction      Shoulder adduction      Shoulder extension      Shoulder internal rotation      Shoulder external rotation      Elbow flexion      Elbow extension      Wrist flexion      Wrist extension      Wrist ulnar deviation      Wrist radial deviation      Wrist pronation      Wrist supination       (Blank rows = not tested)   UPPER EXTREMITY MMT:   MMT Right 01/11/2022 Left 01/11/2022  Shoulder flexion 5/5 5/5  Shoulder extension      Shoulder abduction 5/5 5/5  Shoulder adduction      Shoulder extension      Shoulder internal rotation 5/5 5/5  Shoulder external rotation 5/5 5/5  Middle trapezius      Lower trapezius      Elbow flexion 5/5 5/5  Elbow extension 5/5 5/5  Wrist flexion      Wrist extension      Wrist ulnar deviation      Wrist radial deviation      Wrist pronation      Wrist supination      Grip strength       (Blank rows = not tested)   CERVICAL SPECIAL TESTS:  01/20/2022:  standing head distance from wall : 2.5 inches  01/11/2022 Passive accessory hypomobility and concordant pain noted from Rt cervical inferior translatory glides C4-C7.  Hypomobility throughout upper and mid thoracic cPA.  TODAY'S TREATMENT:  01/22/2022:  Therex:       Seated thoracic extension over chair c half foam hold x 15   Standing back against wall cervical retraction into yellow ball 10 sec x 8   Standing at wall thoracic rotations c horizontal abduction x 15 bilateral - green band    Standing tband rows green x 15   Standing tband gh ext green x  15   Corner/doorway stretch 15 sec x 5      UBE fwd/back 4.5 mins each way lvl 2.0     Manual:    Prone cPA T1-T8 g4, regional PA mobs to mid thoracic g4  01/20/2022:  Therex:   Prone scapular retraction hold 5 sec x 10    Prone scapular retraction hold c GH ext 5 sec x 10       Seated thoracic extension over chair hold x 15   Standing back against wall cervical retraction into yellow ball 5 sec x 10    Standing at wall thoracic rotations c horizontal abduction x 10 bilateral   Standing tband rows green 2 x 15      UBE fwd/back 3 mins each way lvl 2.0     Manual:    Prone cPA T1-T8 g4  01/14/2022:  Therex:   Prone scapular retraction hold 5 sec x 10    Prone scapular retraction hold c GH ext 5 sec x 10    Supine cervical retraction into pillow 5 sec hold x 10    Seated thoracic extension over chair hold x 15    Standing tband rows green x20   Standing tband gh ext green x 20    Nustep UE/LE lvl 6 6 mins      Manual:    Prone cPA T1-T8 g4, Prone Rt uPA C4-C7    PATIENT EDUCATION:  01/14/2022 Education details: HEP progression Person educated: Patient Education method: Consulting civil engineer, Demonstration, Corporate treasurer cues, Verbal cues, and Handouts Education comprehension: verbalized understanding, returned demonstration, and verbal cues required     HOME EXERCISE PROGRAM: Access Code: [email or web address] URL: [email or web address] Date: 01/14/2022 Prepared by: Scot Jun  Exercises - Supine Cervical Retraction with Towel  - 1-2 x daily - 7 x weekly - 1 sets - 10 reps - 5 hold - Seated Scapular Retraction  - 3-5 x daily - 7 x weekly - 1 sets - 10 reps - 3-5 hold - Supine Cervical Rotation AROM on Pillow  - 1-2 x daily - 7 x weekly - 1 sets - 10 reps - Seated Thoracic Lumbar Extension  - 1-2 x daily - 7 x weekly - 1 sets - 10 reps - Seated Cervical Rotation AROM  - 1-2 x daily - 7 x weekly - 1 sets - 10 reps - Supine Scapular Retraction  - 1-2 x daily - 7 x weekly - 1  sets - 10 reps - 5 hold - Standing Shoulder Row with Anchored Resistance  - 2 x daily - 7 x weekly - 3 sets - 10 reps - Shoulder Extension with Resistance  - 2 x daily - 7 x weekly - 3 sets - 10 reps - Prone Scapular Retraction  - 1 x daily - 7 x weekly - 1 sets - 10 reps - 5 hold - Prone Scapular Slide with Shoulder Extension  - 1 x daily - 7 x weekly - 1 sets - 10 reps - 5 hold   ASSESSMENT:   CLINICAL IMPRESSION: Seated complaints most limited by thoracic  posture positioning (increased kyphosis) and mobility restrictions in area.  Detailed relief options (pillows to support) as well as options for progressive stretching (reduce some support for a few minutes).  Cervical range of motion continued to show improvements.  Continued skilled PT services indicated at this time to continue progression.      OBJECTIVE IMPAIRMENTS decreased activity tolerance, decreased endurance, decreased mobility, decreased ROM, hypomobility, increased fascial restrictions, impaired perceived functional ability, impaired flexibility, improper body mechanics, postural dysfunction, and pain.    ACTIVITY LIMITATIONS bending, sitting, standing, sleeping, and caring for others   PARTICIPATION LIMITATIONS: meal prep, interpersonal relationship, driving, community activity, and caregiving   PERSONAL FACTORS  COPD, GERD, history of melanoma,   are also affecting patient's functional outcome.    REHAB POTENTIAL: Good   CLINICAL DECISION MAKING: Stable/uncomplicated   EVALUATION COMPLEXITY: Low     GOALS: Goals reviewed with patient? Yes   Short term PT Goals (target date for Short term goals are 3 weeks 02/01/2022) Patient will demonstrate independent use of home exercise program to maintain progress from in clinic treatments. Goal status: on going - assessed 01/15/2012   Long term PT goals (target dates for all long term goals are 10 weeks  03/22/2022 )   1. Patient will demonstrate/report pain at worst less  than or equal to 2/10 to facilitate minimal limitation in daily activity secondary to pain symptoms. Goal status: New   2. Patient will demonstrate independent use of home exercise program to facilitate ability to maintain/progress functional gains from skilled physical therapy services. Goal status: New   3. Patient will demonstrate FOTO outcome > or = 67 % to indicate reduced disability due to condition. Goal status: New   4.  Patient will demonstrate cervical AROM WFL s symptoms to facilitate daily activity including driving, self care at PLOF s limitation due to symptoms. Goal status: New   5.  Patient will demonstrate/report abiltity to perform driving s restriction due to head movements.   Goal status: New     PLAN: PT FREQUENCY: 1-2x/week   PT DURATION: 10 weeks   PLANNED INTERVENTIONS: Therapeutic exercises, Therapeutic activity, Neuro Muscular re-education, Balance training, Gait training, Patient/Family education, Joint mobilization, Stair training, DME instructions, Dry Needling, Electrical stimulation, Cryotherapy, Moist heat, Taping, Ultrasound, Ionotophoresis '4mg'$ /ml Dexamethasone, and Manual therapy.  All included unless contraindicated   PLAN FOR NEXT SESSION: Thoracic mobility improvements in manual and therex.   LTG reassessment/FOTO update possible.    Scot Jun, PT, DPT, OCS, ATC 01/22/22  11:50 AM

## 2022-01-25 ENCOUNTER — Other Ambulatory Visit (HOSPITAL_COMMUNITY): Payer: Self-pay | Admitting: Urology

## 2022-01-25 ENCOUNTER — Ambulatory Visit (HOSPITAL_COMMUNITY)
Admission: RE | Admit: 2022-01-25 | Discharge: 2022-01-25 | Disposition: A | Discharge status: Home / Self Care | Payer: Medicare Other | Source: Ambulatory Visit | Attending: Urology | Admitting: Urology

## 2022-01-25 DIAGNOSIS — R972 Elevated prostate specific antigen [PSA]: Secondary | ICD-10-CM | POA: Diagnosis not present

## 2022-01-25 DIAGNOSIS — Z96642 Presence of left artificial hip joint: Secondary | ICD-10-CM | POA: Diagnosis not present

## 2022-01-25 DIAGNOSIS — R590 Localized enlarged lymph nodes: Secondary | ICD-10-CM | POA: Diagnosis not present

## 2022-01-25 DIAGNOSIS — N402 Nodular prostate without lower urinary tract symptoms: Secondary | ICD-10-CM | POA: Diagnosis not present

## 2022-01-25 MED ORDER — GADOBUTROL 1 MMOL/ML IV SOLN
8.0000 mL | Freq: Once | INTRAVENOUS | Status: AC | PRN
  Administered 2022-01-25: 8 mL via INTRAVENOUS

## 2022-01-26 ENCOUNTER — Encounter: Payer: Self-pay | Admitting: Physical Therapy

## 2022-01-26 ENCOUNTER — Ambulatory Visit (INDEPENDENT_AMBULATORY_CARE_PROVIDER_SITE_OTHER): Payer: Medicare Other | Admitting: Physical Therapy

## 2022-01-26 DIAGNOSIS — M542 Cervicalgia: Secondary | ICD-10-CM

## 2022-01-26 DIAGNOSIS — R293 Abnormal posture: Secondary | ICD-10-CM | POA: Diagnosis not present

## 2022-01-26 NOTE — Therapy (Signed)
OUTPATIENT PHYSICAL THERAPY TREATMENT NOTE   Patient Name: [name] MRN: [id number] DOB:1951/05/10, 71 y.o., male Today's Date: 01/26/2022  REFERRING PROVIDER: Bo Merino, MD  END OF SESSION:   PT End of Session - 01/26/22 1130     Visit Number 5    Number of Visits 20    Date for PT Re-Evaluation 03/22/22    Authorization Type [employer]    Progress Note Due on Visit 10    PT Start Time 1100    PT Stop Time 1140    PT Time Calculation (min) 40 min    Activity Tolerance Patient tolerated treatment well    Behavior During Therapy WFL for tasks assessed/performed                Past Medical History:  Diagnosis Date   Arthritis    Bronchitis    history of   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 last stress test 2003   Pneumonia 2009   Primary immune deficiency disorder ([place])    Tumor cells, benign    lung, left side   Unstable angina ([place]) 10/28/2017   Past Surgical History:  Procedure Laterality Date   APPENDECTOMY  1984   CATARACT EXTRACTION     L and R eye   CHOLECYSTECTOMY  2002   HYDROCELE EXCISION Right 11/04/2016   Procedure: HYDROCELECTOMY ADULT;  Surgeon: Franchot Gallo, MD;  Location: [facility];  Service: Urology;  Laterality: Right;   INGUINAL HERNIA REPAIR Bilateral 11/04/2016   Procedure: HERNIA REPAIR INGUINAL ADULT BILATERAL;  Surgeon: Jackolyn Confer, MD;  Location: [facility];  Service: General;  Laterality: Bilateral;   INSERTION OF MESH Bilateral 11/04/2016   Procedure: INSERTION OF MESH;  Surgeon: Jackolyn Confer, MD;  Location: [facility];  Service: General;  Laterality: Bilateral;   JOINT REPLACEMENT     Lt hip   LEFT HEART CATH AND CORONARY ANGIOGRAPHY N/A 10/31/2017   Procedure: LEFT HEART CATH AND CORONARY ANGIOGRAPHY;  Surgeon: Troy Sine, MD;  Location: [facility];  Service: Cardiovascular;  Laterality: N/A;   MASTOIDECTOMY  1996   NASAL SEPTUM SURGERY     sinus surgery   PARATHYROIDECTOMY     TOTAL HIP REVISION  06/14/2011   Procedure: TOTAL HIP REVISION;  Surgeon: Kerin Salen;  Location: [facility];  Service: Orthopedics;  Laterality: Left;  Left Acetabular  Hip Revision   Patient Active Problem List   Diagnosis Date Noted   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   Orthostatic dizziness 11/17/2021   Bilateral lower extremity edema 11/17/2021   Benign neoplasm of stomach 07/13/2021   Acute rhinosinusitis 06/03/2021   Cognitive change 08/05/2020   Acquired deformity of neck 06/23/2020   Allergic rhinitis 06/23/2020   Amnesia 06/23/2020   Cataract 06/23/2020   Cerebrovascular disease 06/23/2020   Closed fracture of one rib 06/23/2020   Coronary artery disease 06/23/2020   Dementia with behavioral disturbance ([place]) 06/23/2020  Dysgeusia 06/23/2020   Extrapyramidal and movement disorder, unspecified 06/23/2020   Gallstones 06/23/2020   Generalized lymphadenopathy 06/23/2020   Hardening of the aorta (main artery of the heart) ([place]) 06/23/2020   Herpes simplex viral infection 06/23/2020   Hypothyroid 06/23/2020   Insomnia 06/23/2020   Malignant melanoma of skin ([place]) 06/23/2020   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/23/2020   Mitral valve disorder 06/23/2020   Monocytosis 06/23/2020   Neuropathic pain 06/23/2020   Prediabetes 06/23/2020   Renal cyst 06/23/2020   Senile purpura ([place]) 06/23/2020   Solitary pulmonary nodule 06/23/2020   Spinal stenosis in cervical region 06/23/2020   Bronchiectasis without complication ([place]) 40/69/8614   Centrilobular emphysema  ([place]) 03/25/2020   Abnormal findings on diagnostic imaging of lung 03/25/2020   Acute exacerbation of COPD with asthma ([place]) 02/13/2020   Abnormal CT of the chest 07/30/2019   Elevated rheumatoid factor 07/30/2019   Oral candidiasis 07/30/2019   Sinus tachycardia 04/03/2019   Chronic otorrhea of left ear 03/28/2019   Confusion 01/02/2019   Bilateral impacted cerumen 01/31/2018   Abnormal EKG-dynamic TWI 10/31/2017   PVC's (premature ventricular contractions) 10/31/2017   Family history of coronary artery disease in father 10/31/2017   Unstable angina ([place]) 10/28/2017   Bilateral inguinal hernia 11/04/2016   Neck pain 10/14/2016   Mobitz type 1 second degree atrioventricular block 03/16/2016   Syncope 03/15/2016   Acute suppurative otitis media of left ear without spontaneous rupture of tympanic membrane 12/24/2015   Malnutrition of moderate degree 10/21/2015   Hyperkinetic disorder    Choreiform movements 10/20/2015   Bilateral tympanic membrane perforation 09/24/2015   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   Weight loss 09/24/2015   Chronic respiratory failure with hypoxia ([place]) 09/14/2015   Cough    Acute respiratory distress 09/04/2015   Barrett's esophagus 09/04/2015   Dyspnea on exertion 09/04/2015   CAP (community acquired pneumonia) 09/03/2015   Upper airway cough syndrome 08/21/2015   Chest pain with moderate risk of acute coronary syndrome 05/13/2015   Bilateral inguinal hernia ([facility]) 01/08/2014   Ileus ([place]) 08/18/2012   Pain due to Left hip joint prosthesis 06/13/2011   Hyperparathyroidism, primary, s/p MI parathyroidectomy 7/18, 3.1 gm adenoma 02/19/2011   LIPOMAS, MULTIPLE 10/24/2007   HLD (hyperlipidemia) 10/24/2007   Depression 10/24/2007   ADD (attention deficit disorder) 10/24/2007   SINUSITIS, CHRONIC 10/24/2007   PNEUMONIA, RECURRENT 10/24/2007   HIP FRACTURE, LEFT 10/24/2007   GENITAL HERPES, HX OF 10/24/2007   Former smoker  10/24/2007   HIP REPLACEMENT, TOTAL, HX OF 10/24/2007   Other acquired absence of organ 10/24/2007   G E R D 07/13/2007    REFERRING DIAG: M50.30 (ICD-10-CM) - DDD (degenerative disc disease), cervical  ONSET DATE: worse in last 6 months  THERAPY DIAG:  Cervicalgia  Abnormal posture  Rationale for Evaluation and Treatment Rehabilitation  PERTINENT HISTORY: COPD, GERD, history of melanoma, OA, history of Lt THA  PRECAUTIONS: None  SUBJECTIVE:  SUBJECTIVE STATEMENT: Pt indicated no specific pain complaints upon arrival today.  Pt indicated progressively getting some improvement in extension back to chair.    PERTINENT HISTORY:  COPD, GERD, history of melanoma, OA, history of Lt THA   PAIN:  NPRS scale: no pain at rest Pain location: cervical  Pain description: achy, stiffness Aggravating factors: sitting back/lying flat Relieving factors: nothing specific    PRECAUTIONS: None   WEIGHT BEARING RESTRICTIONS No   FALLS:  Has patient fallen in last 6 months? No   LIVING ENVIRONMENT: Lives with: lives with their spouse Lives in: House/apartment Stairs: none     OCCUPATION: Retired   PLOF: Independent, caregiver for wife, grandchildren care.  Rt hand dominant   PATIENT GOALS   Do something to improve pain/movement, prevent it from getting worse   OBJECTIVE:    PATIENT SURVEYS:  01/11/2022 FOTO intake: 63   predicted:   67   COGNITION: 01/11/2022 Overall cognitive status: Within functional limits for tasks assessed   SENSATION: 01/11/2022 WFL in UE   POSTURE:  01/11/2022 rounded shoulders, forward head, and increased thoracic kyphosis   PALPATION: 01/11/2022 mild tenderness to touch Rt cervical paraspinals mid and lower    CERVICAL ROM:             01/11/2022:   Rt lower cervical pain noted c extension, Rt rotation Active ROM AROM (deg) 01/11/2022 AROM 01/14/2022 AROM 01/20/2022  Flexion 40  50  Extension 50  55  Right lateral flexion      Left lateral flexion      Right rotation 40 60 68  Left rotation 62  78   (Blank rows = not tested)   UPPER EXTREMITY ROM:    ROM Right 01/11/2022 Left 01/11/2022  Shoulder flexion      Shoulder extension      Shoulder abduction      Shoulder adduction      Shoulder extension      Shoulder internal rotation      Shoulder external rotation      Elbow flexion      Elbow extension      Wrist flexion      Wrist extension      Wrist ulnar deviation      Wrist radial deviation      Wrist pronation      Wrist supination       (Blank rows = not tested)   UPPER EXTREMITY MMT:   MMT Right 01/11/2022 Left 01/11/2022  Shoulder flexion 5/5 5/5  Shoulder extension      Shoulder abduction 5/5 5/5  Shoulder adduction      Shoulder extension      Shoulder internal rotation 5/5 5/5  Shoulder external rotation 5/5 5/5  Middle trapezius      Lower trapezius      Elbow flexion 5/5 5/5  Elbow extension 5/5 5/5  Wrist flexion      Wrist extension      Wrist ulnar deviation      Wrist radial deviation      Wrist pronation      Wrist supination      Grip strength       (Blank rows = not tested)   CERVICAL SPECIAL TESTS:  01/20/2022:  standing head distance from wall : 2.5 inches  01/11/2022 Passive accessory hypomobility and concordant pain noted from Rt cervical inferior translatory glides C4-C7.  Hypomobility throughout upper and mid thoracic cPA.  TODAY'S TREATMENT:   01/26/22:   TherEx: UBE: Level 2.5 ( 4 minutes each direction) Standing rows: level 4 band 2 x 15 Standing shoulder extension Level 3 band 2  x 15 Standing trunk extension: elbows on wall x 10 holding 5 sec Supine thoracic stretch over 1/2 bolster holding 2 minutes Supine thoracic and pect stretch with shoulders abd over 1/2  bolster x 2 minutes while performing pursed lip breathing for thoracic expansion Manual Prone PA mobs T2-T8, grade 3-4    01/22/2022:  Therex:       Seated thoracic extension over chair c half foam hold x 15   Standing back against wall cervical retraction into yellow ball 10 sec x 8   Standing at wall thoracic rotations c horizontal abduction x 15 bilateral - green band    Standing tband rows green x 15   Standing tband gh ext green x 15   Corner/doorway stretch 15 sec x 5      UBE fwd/back 4.5 mins each way lvl 2.0     Manual:    Prone cPA T1-T8 g4, regional PA mobs to mid thoracic g4  01/20/2022:  Therex:   Prone scapular retraction hold 5 sec x 10    Prone scapular retraction hold c GH ext 5 sec x 10       Seated thoracic extension over chair hold x 15   Standing back against wall cervical retraction into yellow ball 5 sec x 10    Standing at wall thoracic rotations c horizontal abduction x 10 bilateral   Standing tband rows green 2 x 15      UBE fwd/back 3 mins each way lvl 2.0     Manual:    Prone cPA T1-T8 g4      PATIENT EDUCATION:  01/14/2022 Education details: HEP progression Person educated: Patient Education method: Consulting civil engineer, Demonstration, Corporate treasurer cues, Verbal cues, and Handouts Education comprehension: verbalized understanding, returned demonstration, and verbal cues required     HOME EXERCISE PROGRAM: Access Code: [email or web address] URL: [email or web address] Date: 01/14/2022 Prepared by: Scot Jun  Exercises - Supine Cervical Retraction with Towel  - 1-2 x daily - 7 x weekly - 1 sets - 10 reps - 5 hold - Seated Scapular Retraction  - 3-5 x daily - 7 x weekly - 1 sets - 10 reps - 3-5 hold - Supine Cervical Rotation AROM on Pillow  - 1-2 x daily - 7 x weekly - 1 sets - 10 reps - Seated Thoracic Lumbar Extension  - 1-2 x daily - 7 x weekly - 1 sets - 10 reps - Seated Cervical Rotation AROM  - 1-2 x daily - 7 x weekly - 1 sets - 10  reps - Supine Scapular Retraction  - 1-2 x daily - 7 x weekly - 1 sets - 10 reps - 5 hold - Standing Shoulder Row with Anchored Resistance  - 2 x daily - 7 x weekly - 3 sets - 10 reps - Shoulder Extension with Resistance  - 2 x daily - 7 x weekly - 3 sets - 10 reps - Prone Scapular Retraction  - 1 x daily - 7 x weekly - 1 sets - 10 reps - 5 hold - Prone Scapular Slide with Shoulder Extension  - 1 x daily - 7 x weekly - 1 sets - 10 reps - 5 hold   ASSESSMENT:   CLINICAL IMPRESSION: Pt arriving today with 1/10 pain in right side cervical spine. Pt stating his pain can  increase to 6-7/10 at times. Pt tolerating exercises well. We added thoracic expansion exercises in supine with deep breathing exercises added. PT able to verbally go through HEP program and demonstrate as needed for compliance. Continue skilled PT to maximize function.      OBJECTIVE IMPAIRMENTS decreased activity tolerance, decreased endurance, decreased mobility, decreased ROM, hypomobility, increased fascial restrictions, impaired perceived functional ability, impaired flexibility, improper body mechanics, postural dysfunction, and pain.    ACTIVITY LIMITATIONS bending, sitting, standing, sleeping, and caring for others   PARTICIPATION LIMITATIONS: meal prep, interpersonal relationship, driving, community activity, and caregiving   PERSONAL FACTORS  COPD, GERD, history of melanoma,   are also affecting patient's functional outcome.    REHAB POTENTIAL: Good   CLINICAL DECISION MAKING: Stable/uncomplicated   EVALUATION COMPLEXITY: Low     GOALS: Goals reviewed with patient? Yes   Short term PT Goals (target date for Short term goals are 3 weeks 02/01/2022) Patient will demonstrate independent use of home exercise program to maintain progress from in clinic treatments. Goal status: MET 01/26/22   Long term PT goals (target dates for all long term goals are 10 weeks  03/22/2022 )   1. Patient will demonstrate/report pain  at worst less than or equal to 2/10 to facilitate minimal limitation in daily activity secondary to pain symptoms. Goal status: New   2. Patient will demonstrate independent use of home exercise program to facilitate ability to maintain/progress functional gains from skilled physical therapy services. Goal status: New   3. Patient will demonstrate FOTO outcome > or = 67 % to indicate reduced disability due to condition. Goal status: New   4.  Patient will demonstrate cervical AROM WFL s symptoms to facilitate daily activity including driving, self care at PLOF s limitation due to symptoms. Goal status: New   5.  Patient will demonstrate/report abiltity to perform driving s restriction due to head movements.   Goal status: New     PLAN: PT FREQUENCY: 1-2x/week   PT DURATION: 10 weeks   PLANNED INTERVENTIONS: Therapeutic exercises, Therapeutic activity, Neuro Muscular re-education, Balance training, Gait training, Patient/Family education, Joint mobilization, Stair training, DME instructions, Dry Needling, Electrical stimulation, Cryotherapy, Moist heat, Taping, Ultrasound, Ionotophoresis 47m/ml Dexamethasone, and Manual therapy.  All included unless contraindicated   PLAN FOR NEXT SESSION: Thoracic mobility improvements in manual and therex.   LTG reassessment   JKearney Hard PT, MPT 01/26/22 11:36 AM   01/26/22  11:36 AM

## 2022-01-28 ENCOUNTER — Ambulatory Visit (INDEPENDENT_AMBULATORY_CARE_PROVIDER_SITE_OTHER): Payer: Medicare Other | Admitting: Rehabilitative and Restorative Service Providers"

## 2022-01-28 ENCOUNTER — Encounter: Payer: Self-pay | Admitting: Rehabilitative and Restorative Service Providers"

## 2022-01-28 DIAGNOSIS — R293 Abnormal posture: Secondary | ICD-10-CM | POA: Diagnosis not present

## 2022-01-28 DIAGNOSIS — M542 Cervicalgia: Secondary | ICD-10-CM | POA: Diagnosis not present

## 2022-01-28 NOTE — Therapy (Addendum)
OUTPATIENT PHYSICAL THERAPY TREATMENT NOTE /PROGRESS NOTE /DISCHARGE   Patient Name: [name] MRN: [id number] DOB:28-Jun-1951, 71 y.o., male Today's Date: 01/28/2022 Progress Note Reporting Period 01/11/2022 to 01/28/2022  See note below for Objective Data and Assessment of Progress/Goals.      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 last stress test 2003   Pneumonia 2009   Primary immune deficiency disorder ([place])    Tumor cells, benign    lung, left side   Unstable angina ([place]) 10/28/2017   Past Surgical History:  Procedure Laterality Date   APPENDECTOMY  1984   CATARACT EXTRACTION     L and R eye   CHOLECYSTECTOMY  2002   HYDROCELE EXCISION Right 11/04/2016   Procedure: HYDROCELECTOMY ADULT;  Surgeon: Franchot Gallo, MD;  Location: [facility];  Service: Urology;  Laterality: Right;   INGUINAL HERNIA REPAIR Bilateral 11/04/2016   Procedure: HERNIA REPAIR INGUINAL ADULT BILATERAL;  Surgeon: Jackolyn Confer, MD;  Location: [facility];  Service: General;  Laterality: Bilateral;   INSERTION OF MESH Bilateral 11/04/2016   Procedure: INSERTION OF MESH;  Surgeon: Jackolyn Confer, MD;  Location: [facility];  Service: General;  Laterality: Bilateral;   JOINT REPLACEMENT     Lt hip   LEFT HEART CATH AND CORONARY ANGIOGRAPHY N/A 10/31/2017   Procedure: LEFT HEART CATH AND CORONARY ANGIOGRAPHY;  Surgeon: Troy Sine, MD;  Location: [facility];  Service: Cardiovascular;  Laterality: N/A;   MASTOIDECTOMY  1996   NASAL SEPTUM SURGERY     sinus surgery   PARATHYROIDECTOMY     TOTAL HIP REVISION  06/14/2011   Procedure: TOTAL HIP REVISION;  Surgeon: Kerin Salen;  Location: [facility];  Service: Orthopedics;  Laterality: Left;  Left Acetabular  Hip Revision   Patient Active Problem List   Diagnosis Date Noted   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   Orthostatic dizziness 11/17/2021   Bilateral lower extremity edema 11/17/2021   Benign neoplasm of stomach 07/13/2021   Acute rhinosinusitis 06/03/2021   Cognitive change 08/05/2020   Acquired deformity of neck 06/23/2020   Allergic rhinitis 06/23/2020   Amnesia 06/23/2020   Cataract 06/23/2020   Cerebrovascular  disease 06/23/2020   Closed fracture of one rib 06/23/2020   Coronary artery disease 06/23/2020   Dementia with behavioral disturbance ([place]) 06/23/2020   Dysgeusia 06/23/2020   Extrapyramidal and movement disorder, unspecified 06/23/2020   Gallstones 06/23/2020   Generalized lymphadenopathy 06/23/2020   Hardening of the aorta (main artery of the heart) ([place]) 06/23/2020   Herpes simplex viral infection 06/23/2020   Hypothyroid 06/23/2020   Insomnia 06/23/2020   Malignant melanoma of skin ([place]) 06/23/2020   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   Mitral valve disorder 06/23/2020   Monocytosis 06/23/2020   Neuropathic pain 06/23/2020   Prediabetes 06/23/2020   Renal cyst 06/23/2020   Senile purpura ([place]) 06/23/2020    Solitary pulmonary nodule 06/23/2020   Spinal stenosis in cervical region 06/23/2020   Bronchiectasis without complication ([place]) 38/04/1750   Centrilobular emphysema ([place]) 03/25/2020   Abnormal findings on diagnostic imaging of lung 03/25/2020   Acute exacerbation of COPD with asthma ([place]) 02/13/2020   Abnormal CT of the chest 07/30/2019   Elevated rheumatoid factor 07/30/2019   Oral candidiasis 07/30/2019   Sinus tachycardia 04/03/2019   Chronic otorrhea of left ear 03/28/2019   Confusion 01/02/2019   Bilateral impacted cerumen 01/31/2018   Abnormal EKG-dynamic TWI 10/31/2017   PVC's (premature ventricular contractions) 10/31/2017   Family history of coronary artery disease in father 10/31/2017   Unstable angina ([place]) 10/28/2017   Bilateral inguinal hernia 11/04/2016   Neck pain 10/14/2016   Mobitz type 1 second degree atrioventricular block 03/16/2016   Syncope 03/15/2016   Acute suppurative otitis media of left ear without spontaneous rupture of tympanic membrane 12/24/2015   Malnutrition of moderate degree 10/21/2015   Hyperkinetic disorder    Choreiform movements 10/20/2015   Bilateral tympanic membrane perforation 09/24/2015   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   Weight loss 09/24/2015   Chronic respiratory failure with hypoxia ([place]) 09/14/2015   Cough    Acute respiratory distress 09/04/2015   Barrett's esophagus 09/04/2015   Dyspnea on exertion 09/04/2015   CAP (community acquired pneumonia) 09/03/2015   Upper airway cough syndrome 08/21/2015   Chest pain with moderate risk of acute coronary syndrome 05/13/2015   Bilateral inguinal hernia ([facility]) 01/08/2014   Ileus ([place]) 08/18/2012   Pain due to Left hip joint prosthesis 06/13/2011   Hyperparathyroidism, primary, s/p MI parathyroidectomy 7/18, 3.1 gm adenoma 02/19/2011   LIPOMAS, MULTIPLE 10/24/2007   HLD (hyperlipidemia) 10/24/2007   Depression 10/24/2007   ADD (attention deficit disorder)  10/24/2007   SINUSITIS, CHRONIC 10/24/2007   PNEUMONIA, RECURRENT 10/24/2007   HIP FRACTURE, LEFT 10/24/2007   GENITAL HERPES, HX OF 10/24/2007   Former smoker 10/24/2007   HIP REPLACEMENT, TOTAL, HX OF 10/24/2007   Other acquired absence of organ 10/24/2007   G E R D 07/13/2007    REFERRING DIAG: M50.30 (ICD-10-CM) - DDD (degenerative disc disease), cervical  ONSET DATE: worse in last 6 months  THERAPY DIAG:  Cervicalgia  Abnormal posture  Rationale for Evaluation and Treatment Rehabilitation  PERTINENT HISTORY: COPD, GERD, history of melanoma, OA, history of Lt THA  PRECAUTIONS: None  SUBJECTIVE:  SUBJECTIVE STATEMENT: Pt indicated chief complaint of leaning head back, pain brief but up to 6/10.  Pt indicated moderately better +4 at this time.    PERTINENT HISTORY:  COPD, GERD, history of melanoma, OA, history of Lt THA   PAIN:  NPRS scale: no pain at rest Pain location: cervical  Pain description: achy, stiffness Aggravating factors: sitting back/lying flat Relieving factors: nothing specific    PRECAUTIONS: None   WEIGHT BEARING RESTRICTIONS No   FALLS:  Has patient fallen in last 6 months? No   LIVING ENVIRONMENT: Lives with: lives with their spouse Lives in: House/apartment Stairs: none     OCCUPATION: Retired   PLOF: Independent, caregiver for wife, grandchildren care.  Rt hand dominant   PATIENT GOALS   Do something to improve pain/movement, prevent it from getting worse   OBJECTIVE:    PATIENT SURVEYS:  01/28/2022 update:  63  01/11/2022 FOTO intake: 63   predicted:   67   COGNITION: 01/11/2022 Overall cognitive status: Within functional limits for tasks assessed   SENSATION: 01/11/2022 WFL in UE   POSTURE:  01/11/2022 rounded shoulders,  forward head, and increased thoracic kyphosis   PALPATION: 01/11/2022 mild tenderness to touch Rt cervical paraspinals mid and lower    CERVICAL ROM:             01/11/2022:  Rt lower cervical pain noted c extension, Rt rotation Active ROM AROM (deg) 01/11/2022 AROM 01/14/2022 AROM 01/20/2022 AROM 01/28/2022  Flexion 40  50 55  Extension 50  55 55  Right lateral flexion       Left lateral flexion       Right rotation 40 60 68 75  Left rotation 62  78 75   (Blank rows = not tested)   UPPER EXTREMITY ROM:    ROM Right 01/11/2022 Left 01/11/2022  Shoulder flexion      Shoulder extension      Shoulder abduction      Shoulder adduction      Shoulder extension      Shoulder internal rotation      Shoulder external rotation      Elbow flexion      Elbow extension      Wrist flexion      Wrist extension      Wrist ulnar deviation      Wrist radial deviation      Wrist pronation      Wrist supination       (Blank rows = not tested)   UPPER EXTREMITY MMT:   MMT Right 01/11/2022 Left 01/11/2022  Shoulder flexion 5/5 5/5  Shoulder extension      Shoulder abduction 5/5 5/5  Shoulder adduction      Shoulder extension      Shoulder internal rotation 5/5 5/5  Shoulder external rotation 5/5 5/5  Middle trapezius      Lower trapezius      Elbow flexion 5/5 5/5  Elbow extension 5/5 5/5  Wrist flexion      Wrist extension      Wrist ulnar deviation      Wrist radial deviation      Wrist pronation      Wrist supination      Grip strength       (Blank rows = not tested)   CERVICAL SPECIAL TESTS:  01/28/2022:  standing head distance from wall : 2.5 inches  01/20/2022:  standing head distance from wall : 2.5 inches  01/11/2022 Passive accessory  hypomobility and concordant pain noted from Rt cervical inferior translatory glides C4-C7.  Hypomobility throughout upper and mid thoracic cPA.       TODAY'S TREATMENT:  01/28/2022:  Therex:   Review of existing HEP for comprehension  for trial HEP period.      UBE fwd/back 4 mins each way lvl 3.0     Manual:    Prone cPA T1-T8 g4, regional PA mobs to mid thoracic g4.  01/26/22:   TherEx: UBE: Level 2.5 ( 4 minutes each direction) Standing rows: level 4 band 2 x 15 Standing shoulder extension Level 3 band 2  x 15 Standing trunk extension: elbows on wall x 10 holding 5 sec Supine thoracic stretch over 1/2 bolster holding 2 minutes Supine thoracic and pect stretch with shoulders abd over 1/2 bolster x 2 minutes while performing pursed lip breathing for thoracic expansion Manual Prone PA mobs T2-T8, grade 3-4    01/22/2022:  Therex:       Seated thoracic extension over chair c half foam hold x 15   Standing back against wall cervical retraction into yellow ball 10 sec x 8   Standing at wall thoracic rotations c horizontal abduction x 15 bilateral - green band    Standing tband rows green x 15   Standing tband gh ext green x 15   Corner/doorway stretch 15 sec x 5      UBE fwd/back 4.5 mins each way lvl 2.0     Manual:    Prone cPA T1-T8 g4, regional PA mobs to mid thoracic g4    PATIENT EDUCATION:  01/14/2022 Education details: HEP progression Person educated: Patient Education method: Consulting civil engineer, Media planner, Corporate treasurer cues, Verbal cues, and Handouts Education comprehension: verbalized understanding, returned demonstration, and verbal cues required     HOME EXERCISE PROGRAM: Access Code: [email or web address] URL: [email or web address] Date: 01/14/2022 Prepared by: Scot Jun  Exercises - Supine Cervical Retraction with Towel  - 1-2 x daily - 7 x weekly - 1 sets - 10 reps - 5 hold - Seated Scapular Retraction  - 3-5 x daily - 7 x weekly - 1 sets - 10 reps - 3-5 hold - Supine Cervical Rotation AROM on Pillow  - 1-2 x daily - 7 x weekly - 1 sets - 10 reps - Seated Thoracic Lumbar Extension  - 1-2 x daily - 7 x weekly - 1 sets - 10 reps - Seated Cervical Rotation AROM  - 1-2 x daily - 7 x weekly  - 1 sets - 10 reps - Supine Scapular Retraction  - 1-2 x daily - 7 x weekly - 1 sets - 10 reps - 5 hold - Standing Shoulder Row with Anchored Resistance  - 2 x daily - 7 x weekly - 3 sets - 10 reps - Shoulder Extension with Resistance  - 2 x daily - 7 x weekly - 3 sets - 10 reps - Prone Scapular Retraction  - 1 x daily - 7 x weekly - 1 sets - 10 reps - 5 hold - Prone Scapular Slide with Shoulder Extension  - 1 x daily - 7 x weekly - 1 sets - 10 reps - 5 hold   ASSESSMENT:   CLINICAL IMPRESSION: Pt has attended 6 visits overall during course of treatment, reporting moderately better on global rating of change.  Symptoms can still worsen up to 6/10 but has shown less frequency and duration.  See objective data for updated information.  Movement improvements noted in cervical mobility c no symptoms.  At this time, Pt and clinician were in agreement with trial HEP period to continued gains towards long term goals.      OBJECTIVE IMPAIRMENTS decreased activity tolerance, decreased endurance, decreased mobility, decreased ROM, hypomobility, increased fascial restrictions, impaired perceived functional ability, impaired flexibility, improper body mechanics, postural dysfunction, and pain.    ACTIVITY LIMITATIONS bending, sitting, standing, sleeping, and caring for others   PARTICIPATION LIMITATIONS: meal prep, interpersonal relationship, driving, community activity, and caregiving   PERSONAL FACTORS  COPD, GERD, history of melanoma,   are also affecting patient's functional outcome.    REHAB POTENTIAL: Good   CLINICAL DECISION MAKING: Stable/uncomplicated   EVALUATION COMPLEXITY: Low     GOALS: Goals reviewed with patient? Yes   Short term PT Goals (target date for Short term goals are 3 weeks 02/01/2022) Patient will demonstrate independent use of home exercise program to maintain progress from in clinic treatments. Goal status: MET 01/26/22   Long term PT goals (target dates for all long  term goals are 10 weeks  03/22/2022 )   1. Patient will demonstrate/report pain at worst less than or equal to 2/10 to facilitate minimal limitation in daily activity secondary to pain symptoms. Goal status: on going - assessed 01/28/2022   2. Patient will demonstrate independent use of home exercise program to facilitate ability to maintain/progress functional gains from skilled physical therapy services. Goal status: Met - assessed 01/28/2022   3. Patient will demonstrate FOTO outcome > or = 67 % to indicate reduced disability due to condition. Goal status: on going - 01/28/2022   4.  Patient will demonstrate cervical AROM WFL s symptoms to facilitate daily activity including driving, self care at PLOF s limitation due to symptoms. Goal status: mostly met 01/28/2022   5.  Patient will demonstrate/report abiltity to perform driving s restriction due to head movements.   Goal status: on going  01/28/2022     PLAN: PT FREQUENCY: 1-2x/week   PT DURATION: 10 weeks   PLANNED INTERVENTIONS: Therapeutic exercises, Therapeutic activity, Neuro Muscular re-education, Balance training, Gait training, Patient/Family education, Joint mobilization, Stair training, DME instructions, Dry Needling, Electrical stimulation, Cryotherapy, Moist heat, Taping, Ultrasound, Ionotophoresis 30m/ml Dexamethasone, and Manual therapy.  All included unless contraindicated   PLAN FOR NEXT SESSION:  Trial HEP  MScot Jun PT, DPT, OCS, ATC 01/28/22  1:34 PM   PHYSICAL THERAPY DISCHARGE SUMMARY  Visits from Start of Care: 6  Current functional level related to goals / functional outcomes: See note   Remaining deficits: See note   Education / Equipment: HEP  Patient goals were partially met. Patient is being discharged due to being pleased with the current functional level. And not returning since last visit (trial HEP).   MScot Jun PT, DPT, OCS, ATC 03/04/22  3:14 PM

## 2022-02-01 DIAGNOSIS — H0288B Meibomian gland dysfunction left eye, upper and lower eyelids: Secondary | ICD-10-CM | POA: Diagnosis not present

## 2022-02-01 DIAGNOSIS — H0102A Squamous blepharitis right eye, upper and lower eyelids: Secondary | ICD-10-CM | POA: Diagnosis not present

## 2022-02-01 DIAGNOSIS — Z961 Presence of intraocular lens: Secondary | ICD-10-CM | POA: Diagnosis not present

## 2022-02-01 DIAGNOSIS — H0102B Squamous blepharitis left eye, upper and lower eyelids: Secondary | ICD-10-CM | POA: Diagnosis not present

## 2022-02-01 DIAGNOSIS — H0288A Meibomian gland dysfunction right eye, upper and lower eyelids: Secondary | ICD-10-CM | POA: Diagnosis not present

## 2022-02-01 DIAGNOSIS — H43813 Vitreous degeneration, bilateral: Secondary | ICD-10-CM | POA: Diagnosis not present

## 2022-02-02 ENCOUNTER — Encounter: Payer: PRIVATE HEALTH INSURANCE | Admitting: Rehabilitative and Restorative Service Providers"

## 2022-02-04 ENCOUNTER — Encounter: Payer: PRIVATE HEALTH INSURANCE | Admitting: Rehabilitative and Restorative Service Providers"

## 2022-02-06 DIAGNOSIS — M542 Cervicalgia: Secondary | ICD-10-CM | POA: Diagnosis not present

## 2022-02-06 DIAGNOSIS — M4692 Unspecified inflammatory spondylopathy, cervical region: Secondary | ICD-10-CM | POA: Diagnosis not present

## 2022-02-09 ENCOUNTER — Encounter: Payer: PRIVATE HEALTH INSURANCE | Admitting: Rehabilitative and Restorative Service Providers"

## 2022-02-11 ENCOUNTER — Encounter: Payer: PRIVATE HEALTH INSURANCE | Admitting: Rehabilitative and Restorative Service Providers"

## 2022-02-18 DIAGNOSIS — N62 Hypertrophy of breast: Secondary | ICD-10-CM | POA: Diagnosis not present

## 2022-02-18 DIAGNOSIS — R1900 Intra-abdominal and pelvic swelling, mass and lump, unspecified site: Secondary | ICD-10-CM | POA: Diagnosis not present

## 2022-02-22 ENCOUNTER — Other Ambulatory Visit: Payer: Self-pay | Admitting: Family Medicine

## 2022-02-22 DIAGNOSIS — N62 Hypertrophy of breast: Secondary | ICD-10-CM

## 2022-02-25 DIAGNOSIS — H6093 Unspecified otitis externa, bilateral: Secondary | ICD-10-CM | POA: Diagnosis not present

## 2022-03-01 ENCOUNTER — Ambulatory Visit (INDEPENDENT_AMBULATORY_CARE_PROVIDER_SITE_OTHER): Payer: Medicare Other | Admitting: Nurse Practitioner

## 2022-03-01 ENCOUNTER — Encounter: Payer: Self-pay | Admitting: Nurse Practitioner

## 2022-03-01 DIAGNOSIS — N62 Hypertrophy of breast: Secondary | ICD-10-CM

## 2022-03-01 DIAGNOSIS — J449 Chronic obstructive pulmonary disease, unspecified: Secondary | ICD-10-CM | POA: Diagnosis not present

## 2022-03-01 DIAGNOSIS — J479 Bronchiectasis, uncomplicated: Secondary | ICD-10-CM | POA: Diagnosis not present

## 2022-03-01 DIAGNOSIS — J309 Allergic rhinitis, unspecified: Secondary | ICD-10-CM | POA: Diagnosis not present

## 2022-03-01 DIAGNOSIS — Z23 Encounter for immunization: Secondary | ICD-10-CM | POA: Diagnosis not present

## 2022-03-01 NOTE — Progress Notes (Signed)
'@Patient'  ID: [name], male    DOB: Apr 29, 1951, 71 y.o.   MRN: [id number]  Chief Complaint  Patient presents with   Follow-up    He is doing well today and has no complaints.      Referring provider: Jonathon Jordan, MD  HPI: 71 year old male, former smoker (36-pack-year history) followed for COPD with chronic bronchitis and emphysema and bronchiectasis.  He is a patient of Dr. Golden Pop and last seen in office 12/01/2021 by [facility] NP.  Past medical history significant for CAD, mitral valve disorder, cerebrovascular disease, Mobitz 1 second-degree AV block, unstable angina, allergic rhinitis, GERD with Barrett's esophagus, hyperparathyroidism status post parathyroidectomy, dementia, HLD.  TEST/EVENTS:  09/25/2019 PFTs: FVC 65, FEV1 73, ratio 83, DLCO corrected for alveolar volume 76% 03/11/2020 PFTs: FVC 65, FEV1 74, ratio 83, DLCOcor 89 08/04/2020 Spirometry: FVC 61, FEV1 70, ratio 84 08/15/2020 HRCT chest: Scattered atherosclerosis.  There is no LAD.  There is a primarily fat-containing hiatal hernia present.  There are scattered tiny centrilobular nodules at the lung apices.  Occasional stable, small definitively benign pulmonary nodules in the left lower lobe.  Minimal paraseptal emphysema is noted.  There is unchanged, mild tubular bronchiectasis.  No significant air trapping present. 07/31/2021 PFTs: FVC 65, FEV1 77, ratio 87, TLC 65, DLCOcor 80.  Mild obstruction and moderate restrictive airway disease with decreased lung volumes.  No significant BD but did have some mid flow reversibility. 11/10/2021 CT chest with contrast: Atherosclerosis and three-vessel CAD present.  No LAD present.  There is a fat-containing hiatal hernia.  Minimal paraseptal emphysema is present.  There is unchanged mild, tubular bronchiectasis throughout.  Background of very tiny centrilobular pulmonary nodules, most concentrated at the lung apices, most commonly seen in smoking-related bronchiolitis.  More  discrete  07/31/2021: OV with [name].  Experiences frequent episodes of recurrent bronchitis.  Has had 4 exacerbations in the last year, requiring antibiotic and prednisone.  PFTs were overall stable.  Still has a restriction with normal DLCO suggesting neuromuscular defect.  Has had a positive rheumatoid factor in the past but denied any major arthritis pains.  HRCT from a year ago showed minimal paraseptal emphysema.  Symptom score was stable.  Advised changing inhaler therapy to [employer].  Discussed alternative of including Daliresp to her regimen or doing triple therapy.  Check A1 AT phenotype MM  11/03/2021: OV with Abigal Choung NP for worsening of COPD/bronchitis symptoms.  He reports increased productive cough over the last 5 days with green-yellow sputum production.  He has also had increased shortness of breath upon exertion.  He watches his grandchildren and feels like he has not been able to to do as much with them as he normally can.  Also has some increased generalized weakness.  Has not had any fevers that he is aware of.  He does report some recent decreased appetite.  Does not appear to have lost any weight since I last saw him in November.  Step up to [employer]. Treated with prednisone taper and doxy course. CT chest w contrast to r/o malignancy or other underlying pulm etiology. Close follow up  11/17/2021: OV with Earlene Bjelland NP for follow up. He continues to have a productive, congested cough. Felt like the prednisone provided minimal relief this time. Has been having difficulties with this cough for sometime now. CT chest did not show any worsening to his emphysema or BTX and no concern for malignancy. His shortness of breath is overall stable. Doesn't notice much wheezing  and has never really had problems with allergies. He's on omeprazole and pepcid for GERD and feels as though this is well-controlled. He continues on [place], does feel like this helped some with his breathing. He continues on flonase  nasal spray, is using mucinex Twice daily and flutter valve 2-3 times a day. FeNO elevated 43 ppb with persistent bronchitic type cough- started on singulair and zyrtec for trigger prevention, and treated with prednisone taper and cough control regimen.   Over the past week, he's felt weaker and has been getting dizzy when he stands. Denies any syncopal events or palpitations. Does occasional have lower extremity swelling, which is a chronic problem for him. He does take metoprolol and his blood pressure was noted to be lower than normal today. Advised that he follow up with cardiology to adjust his BP meds.   12/01/2021: OV with Pasty Manninen NP for follow-up after being treated for exacerbation of COPD/asthma with bronchitis.  Overall he feels like he has improved significantly.  He did have a rough few weeks with sinusitis and conjunctivitis treated by his PCP with amoxicillin.  Feels like he is finally starting to recover from this.  From a pulmonary standpoint, breathing has been stable and he feels like the Judithann Sauger has definitely helped him.  Cough has mostly resolved.  He denies any wheezing, hemoptysis, weight loss, anorexia, lower extremity swelling.  He continues on Singulair, Zyrtec and Flonase with good control.  He did go see cardiology after our last visit who lowered his metoprolol and he has not had any dizziness since. Provided with patient assistance for [facility]   03/01/2022: Today - follow up Patient presents today for follow up. He has been doing well since his last visit. His breathing has been stable and he is very happy that he has been able to go 3 months without needing abx or steroids. His cough has also significantly improved. He definitely feels like the Judithann Sauger has made a difference; fortunately, he was able to get this approved by [employer]. Denies any wheezing, chest congestion, orthopnea, PND, leg swelling. He completed pulmonary rehab, which has helped his stamina. He has recently noticed  some increased breast tissue on the right side; currently working with his PCP to identify the underlying cause.   He also was curious about previously ordered genetic testing; which upon review of his chart, I don't see that [name] ordered. He received a kit in the mail for immunodeficiency workup but he's not sure who the company was or where the results were mailed back to. He hasn't heard anything and completed the screening around 4-5 months ago. Encouraged him to check with his PCP and rheumatology.   Allergies  Allergen Reactions   Bupropion Other (See Comments)    Caused altered mental status Altered mental status Caused altered mental status   Ciprofloxacin-Dexamethasone Other (See Comments)    Burning in ears when Cipro ear drops were used unknown Burning in ears when Cipro ear drops were used   Gabapentin Other (See Comments)    Caused weakness, dizziness, and forgetfulness  Weakness and dizziness with higher doses. Weakness and dizziness Caused weakness, dizziness, and forgetfulness   Ciprofloxacin Hcl Other (See Comments)    Burning in ears when drops were used   Clonidine Hcl Other (See Comments)    Immunization History  Administered Date(s) Administered   H1N1 04/25/2008   Influenza Split 04/25/2008, 04/08/2009, 04/03/2010, 04/02/2011, 04/18/2012, 04/27/2013, 04/05/2015, 03/30/2019   Influenza, High Dose Seasonal PF 03/30/2016  Influenza,inj,Quad PF,6+ Mos 04/02/2016, 03/03/2017, 04/28/2018, 04/04/2019, 03/20/2020   Influenza,inj,quad, With Preservative 04/15/2015   Influenza-Unspecified 03/26/2021   PFIZER(Purple Top)SARS-COV-2 Vaccination 08/12/2019, 09/05/2019, 04/15/2020, 10/13/2020   Pfizer Covid-19 Vaccine Bivalent Booster 43yr & up 03/17/2021   Pneumococcal Conjugate-13 05/08/2015   Pneumococcal Polysaccharide-23 10/24/2007, 03/03/2017   Td 03/26/2004   Tdap 03/13/2013   Zoster, Live 03/13/2013, 05/25/2021    Past Medical History:  Diagnosis  Date   Arthritis    Bronchitis    history of   COPD with asthma ([place] 12/01/2021   Dysrhythmia    ":Due to MVP"   GERD (gastroesophageal reflux disease)    Hiatal hernia    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 last stress test 2003   Pneumonia 2009   Primary immune deficiency disorder ([facility]    Tumor cells, benign    lung, left side   Unstable angina ([facility] 10/28/2017    Tobacco History: Social History   Tobacco Use  Smoking Status Former   Packs/day: 1.50   Years: 24.00   Total pack years: 36.00   Types: Cigarettes   Start date: 1966   Quit date: 07/05/1985   Years since quitting: 36.6   Passive exposure: Never  Smokeless Tobacco Never   Counseling given: Not Answered   Outpatient Medications Prior to Visit  Medication Sig Dispense Refill   albuterol (PROVENTIL) (2.5 MG/3ML) 0.083% nebulizer solution Take 3 mLs (2.5 mg total) by nebulization every 6 (six) hours as needed for wheezing or shortness of breath. 75 mL 5   aspirin 81 MG chewable tablet Chew 81 mg by mouth daily.     Budeson-Glycopyrrol-Formoterol (BREZTRI AEROSPHERE) 160-9-4.8 MCG/ACT AERO Inhale 2 puffs into the lungs. Through patient assistance.     celecoxib (CELEBREX) 200 MG capsule 1 capsule with food as needed     famotidine (PEPCID) 20 MG tablet Take 1 tablet (20 mg total) by mouth at bedtime. 30 tablet 3   fluticasone (FLONASE) 50 MCG/ACT nasal spray Place 1 spray into both nostrils daily. 18.2 mL 2   methylphenidate (RITALIN) 10 MG tablet 1 tablet on empty stomach     metoprolol succinate (TOPROL XL) 25 MG 24 hr tablet Take 1 tablet (25 mg total) by mouth daily. 90 tablet 2   mirtazapine (REMERON) 15 MG  tablet Take 15 mg by mouth at bedtime.     montelukast (SINGULAIR) 10 MG tablet Take 1 tablet (10 mg total) by mouth at bedtime. 30 tablet 5   Multiple Vitamin (MULTIVITAMIN) tablet Take 1 tablet by mouth daily.     omeprazole (PRILOSEC) 40 MG capsule Take 40 mg by mouth every morning.     polyethylene glycol (MIRALAX / GLYCOLAX) packet Take 17 g by mouth daily as needed (for constipation.).      rosuvastatin (CRESTOR) 10 MG tablet TAKE 1 TABLET(10 MG) BY MOUTH DAILY 90 tablet 3   SYNTHROID 25 MCG tablet Take 25 mcg by mouth daily before breakfast.      tamsulosin (FLOMAX) 0.4 MG CAPS capsule Take 0.4 mg by mouth daily after breakfast.      albuterol (VENTOLIN HFA) 108 (90 Base) MCG/ACT inhaler  Inhale 1-2 puffs into the lungs every 6 (six) hours as needed for wheezing or shortness of breath. 18 g 2   Budeson-Glycopyrrol-Formoterol (BREZTRI AEROSPHERE) 160-9-4.8 MCG/ACT AERO Inhale 2 puffs into the lungs in the morning and at bedtime. 10.7 g 0   Budeson-Glycopyrrol-Formoterol (BREZTRI AEROSPHERE) 160-9-4.8 MCG/ACT AERO Inhale 2 puffs into the lungs in the morning and at bedtime. (Patient not taking: Reported on 03/01/2022) 10.7 g 0   No facility-administered medications prior to visit.     Review of Systems:   Constitutional: No weight loss or gain, night sweats, fevers, chills, fatigue, lassitude HEENT: No headaches, difficulty swallowing, tooth/dental problems, or sore throat. No sneezing, itching, ear ache, nasal congestion, or post nasal drip CV:  No chest pain, orthopnea, PND, swelling in lower extremities, anasarca, dizziness, palpitations, syncope Resp: +shortness of breath with exertion (baseline). No cough.  No hemoptysis.  No wheezing. No chest wall deformity GI:  No heartburn, indigestion, abdominal pain, nausea, vomiting, diarrhea, change in bowel habits, loss of appetite, bloody stools.  Skin: No rash, lesions, ulcerations MSK:  No joint pain or swelling.  No decreased range of  motion.  No back pain. Neuro: No dizziness or lightheadedness.  Psych: No depression or anxiety. Mood stable.     Physical Exam:  BP 118/70 (BP Location: Left Arm, Cuff Size: Normal)   Pulse (!) 59   Temp 98.3 F (36.8 C) (Oral)   Ht 5' 11.5" (1.816 m)   Wt 183 lb (83 kg)   SpO2 99%   BMI 25.17 kg/m   GEN: Pleasant, interactive, well-nourished; elderly; in no acute distress. HEENT:  Normocephalic and atraumatic. PERRLA. Sclera white. Nasal turbinates pink, moist and patent bilaterally. No rhinorrhea present. Oropharynx pink and moist, without exudate or edema. No lesions, ulcerations, or postnasal drip.  NECK:  Supple w/ fair ROM. No JVD present. Normal carotid impulses w/o bruits. Thyroid symmetrical with no goiter or nodules palpated. No lymphadenopathy.   CV: RRR, no m/r/g, no peripheral edema. Pulses intact, +2 bilaterally. No cyanosis, pallor or clubbing. PULMONARY:  Unlabored, regular breathing.  Clear bilaterally A&P without wheezes/rhonchi/rales. No accessory muscle use. No dullness to percussion. GI: BS present and normoactive. Soft, non-tender to palpation. No organomegaly or masses detected. No CVA tenderness. MSK: No erythema, warmth or tenderness. Cap refil <2 sec all extrem. No deformities or joint swelling noted.  Neuro: A/Ox3. No focal deficits noted.   Skin: Warm, no lesions or rashe Psych: Normal affect and behavior. Judgement and thought content appropriate.     Lab Results:  CBC    Component Value Date/Time   WBC 11.3 (H) 11/17/2021 1159   RBC 4.68 11/17/2021 1159   HGB 15.1 11/17/2021 1159   HGB 15.4 08/17/2019 1517   HCT 44.8 11/17/2021 1159   PLT 161.0 11/17/2021 1159   PLT 112 (L) 08/17/2019 1517   MCV 95.7 11/17/2021 1159   MCH 30.5 08/17/2019 1517   MCHC 33.7 11/17/2021 1159   RDW 12.8 11/17/2021 1159   LYMPHSABS 1.9 11/17/2021 1159   MONOABS 0.7 11/17/2021 1159   EOSABS 0.2 11/17/2021 1159   BASOSABS 0.0 11/17/2021 1159    BMET     Component Value Date/Time   NA 139 11/17/2021 1159   NA 142 02/13/2018 0851   K 4.3 11/17/2021 1159   CL 103 11/17/2021 1159   CO2 28 11/17/2021 1159   GLUCOSE 92 11/17/2021 1159   BUN 22 11/17/2021 1159   BUN 16 02/13/2018 0851   CREATININE 1.15 11/17/2021  1159   CREATININE 1.01 08/17/2019 1517   CALCIUM 8.9 11/17/2021 1159   CALCIUM 9.0 02/19/2011 1233   GFRNONAA >60 08/17/2019 1517   GFRAA >60 08/17/2019 1517    BNP    Component Value Date/Time   BNP 42.2 06/19/2019 0902     Imaging:  No results found.        Latest Ref Rng & Units 07/31/2021   11:52 AM 08/04/2020    8:53 AM 03/11/2020   10:06 AM 09/25/2019    4:01 PM  PFT Results  FVC-Pre L 2.99  2.95  P 3.17  3.15   FVC-Predicted Pre % 63  61  P 65  65   FVC-Post L 3.10      FVC-Predicted Post % 65      Pre FEV1/FVC % % 84  84  P 83  83   Post FEV1/FCV % % 87      FEV1-Pre L 2.52  2.48  P 2.64  2.63   FEV1-Predicted Pre % 72  70  P 74  73   FEV1-Post L 2.69      DLCO uncorrected ml/min/mmHg 22.06   24.75  21.79   DLCO UNC% % 80   89  78   DLCO corrected ml/min/mmHg 22.06   24.75  21.32   DLCO COR %Predicted % 80   89  76   DLVA Predicted % 126   121  112   TLC L 4.84      TLC % Predicted % 65      RV % Predicted % 68        P Preliminary result    Lab Results  Component Value Date   NITRICOXIDE 14 05/26/2016        Assessment & Plan:   COPD with asthma ([place]) Compensated on current regimen. He has completed pulmonary rehab and is doing well. No changes made to current regimen - continue Breztri and prn albuterol. Continue singulair for trigger prevention.   Patient Instructions  Continue Breztri 2 puffs Twice daily. Brush tongue and rinse mouth afterwards.  Continue Albuterol inhaler 2 puffs or 3 mL neb every 6 hours as needed for shortness of breath or wheezing. Notify if symptoms persist despite rescue inhaler/neb use. Use neb then flutter valve Twice daily  Continue flonase nasal spray  1-2 sprays each nostril daily  Continue Mucinex ER Twice daily for chest congestion Continue Flutter valve 2-3 times a day  Continue pepcid 20 mg daily at bedtime Continue omeprazole 40 mg daily for reflux  Continue Singulair 10 mg At bedtime  Continue Zyrtec 10 mg daily    Follow up in 3 months with [name] or Alanson Aly. If symptoms do not improve or worsen, please contact office for sooner follow up or seek emergency care    Bronchiectasis without complication ([place]) Stable and compensated on current regimen. Continue mucinex PRN cough/congestion.   Allergic rhinitis Well-controlled on current regimen.   Gynecomastia Recently diagnosed with gynecomastia. He is currently working with his PCP at possible causes. They are curious if it is correlated to his Remeron. Advised him to also discuss his omeprazole with her, as this has been linked to gynecomastia in the past. He is currently awaiting mammogram for further evaluation.     I spent 28 minutes of dedicated to the care of this patient on the date of this encounter to include pre-visit review of records, face-to-face time with the patient discussing conditions above, post visit ordering  of testing, clinical documentation with the electronic health record, making appropriate referrals as documented, and communicating necessary findings to members of the patients care team.  Clayton Bibles, NP 03/01/2022  Pt aware and understands NP's role.

## 2022-03-01 NOTE — Assessment & Plan Note (Signed)
Compensated on current regimen. He has completed pulmonary rehab and is doing well. No changes made to current regimen - continue Breztri and prn albuterol. Continue singulair for trigger prevention.   Patient Instructions  Continue Breztri 2 puffs Twice daily. Brush tongue and rinse mouth afterwards.  Continue Albuterol inhaler 2 puffs or 3 mL neb every 6 hours as needed for shortness of breath or wheezing. Notify if symptoms persist despite rescue inhaler/neb use. Use neb then flutter valve Twice daily  Continue flonase nasal spray 1-2 sprays each nostril daily  Continue Mucinex ER Twice daily for chest congestion Continue Flutter valve 2-3 times a day  Continue pepcid 20 mg daily at bedtime Continue omeprazole 40 mg daily for reflux  Continue Singulair 10 mg At bedtime  Continue Zyrtec 10 mg daily    Follow up in 3 months with [name] or Alanson Aly. If symptoms do not improve or worsen, please contact office for sooner follow up or seek emergency care

## 2022-03-01 NOTE — Assessment & Plan Note (Signed)
Recently diagnosed with gynecomastia. He is currently working with his PCP at possible causes. They are curious if it is correlated to his Remeron. Advised him to also discuss his omeprazole with her, as this has been linked to gynecomastia in the past. He is currently awaiting mammogram for further evaluation.

## 2022-03-01 NOTE — Assessment & Plan Note (Signed)
Well-controlled on current regimen. ?

## 2022-03-01 NOTE — Patient Instructions (Signed)
Continue Breztri 2 puffs Twice daily. Brush tongue and rinse mouth afterwards.  Continue Albuterol inhaler 2 puffs or 3 mL neb every 6 hours as needed for shortness of breath or wheezing. Notify if symptoms persist despite rescue inhaler/neb use. Use neb then flutter valve Twice daily  Continue flonase nasal spray 1-2 sprays each nostril daily  Continue Mucinex ER Twice daily for chest congestion Continue Flutter valve 2-3 times a day  Continue pepcid 20 mg daily at bedtime Continue omeprazole 40 mg daily for reflux  Continue Singulair 10 mg At bedtime  Continue Zyrtec 10 mg daily    Follow up in 3 months with [name] or Alanson Aly. If symptoms do not improve or worsen, please contact office for sooner follow up or seek emergency care

## 2022-03-01 NOTE — Assessment & Plan Note (Signed)
Stable and compensated on current regimen. Continue mucinex PRN cough/congestion.

## 2022-03-11 DIAGNOSIS — M6208 Separation of muscle (nontraumatic), other site: Secondary | ICD-10-CM | POA: Diagnosis not present

## 2022-03-22 ENCOUNTER — Ambulatory Visit (INDEPENDENT_AMBULATORY_CARE_PROVIDER_SITE_OTHER): Payer: Medicare Other | Admitting: Podiatry

## 2022-03-22 ENCOUNTER — Encounter: Payer: Self-pay | Admitting: Podiatry

## 2022-03-22 DIAGNOSIS — B351 Tinea unguium: Secondary | ICD-10-CM

## 2022-03-22 DIAGNOSIS — M79675 Pain in left toe(s): Secondary | ICD-10-CM | POA: Diagnosis not present

## 2022-03-22 DIAGNOSIS — M79674 Pain in right toe(s): Secondary | ICD-10-CM

## 2022-03-28 NOTE — Progress Notes (Signed)
  Subjective:  Patient ID: [name], male    DOB: 03/02/1951,  MRN: [id number]  71 y.o. male presents painful thick toenails that are difficult to trim. Pain interferes with ambulation. Aggravating factors include wearing enclosed shoe gear. Pain is relieved with periodic professional debridement.  New problem(s): None   PCP is Jonathon Jordan, MD , and last visit was May 25, 2021.  Allergies  Allergen Reactions   Bupropion Other (See Comments)    Caused altered mental status Altered mental status Caused altered mental status   Ciprofloxacin-Dexamethasone Other (See Comments)    Burning in ears when Cipro ear drops were used unknown Burning in ears when Cipro ear drops were used   Gabapentin Other (See Comments)    Caused weakness, dizziness, and forgetfulness  Weakness and dizziness with higher doses. Weakness and dizziness Caused weakness, dizziness, and forgetfulness   Ciprofloxacin Hcl Other (See Comments)    Burning in ears when drops were used   Clonidine Hcl Other (See Comments)    Review of Systems: Negative except as noted in the HPI.   Objective:  [name] is a pleasant 71 y.o. male, WD, WN in NAD. AAO x 3.  Vascular Examination: Vascular status intact b/l with palpable pedal pulses. CFT immediate b/l. Pedal hair present.No edema. No pain with calf compression b/l. Skin temperature gradient WNL b/l. No varicosities noted.  Neurological Examination: Sensation grossly intact b/l with 10 gram monofilament. Vibratory sensation intact b/l.  Dermatological Examination: Pedal skin with normal turgor, texture and tone b/l. No open wounds nor interdigital macerations noted. Toenails 1-5 b/l thick, discolored, elongated with subungual debris and pain on dorsal palpation. No hyperkeratotic lesions noted b/l.   Musculoskeletal Examination: Muscle strength 5/5 to b/l LE.  No pain, crepitus noted b/l. No gross pedal deformities. Patient ambulates  independently without assistive aids.   Radiographs: None Assessment:   1. Pain due to onychomycosis of toenails of both feet    Plan:  -Patient was evaluated and treated. All patient's and/or POA's questions/concerns answered on today's visit. -Patient to continue soft, supportive shoe gear daily. -Mycotic toenails 1-5 bilaterally were debrided in length and girth with sterile nail nippers and dremel without incident. -Patient/POA to call should there be question/concern in the interim.  Return in about 3 months (around 06/21/2022).  Marzetta Board, DPM

## 2022-04-12 ENCOUNTER — Telehealth: Payer: Self-pay | Admitting: Podiatry

## 2022-04-12 ENCOUNTER — Ambulatory Visit
Admission: RE | Admit: 2022-04-12 | Discharge: 2022-04-12 | Disposition: A | Discharge status: Home / Self Care | Payer: PRIVATE HEALTH INSURANCE | Source: Ambulatory Visit | Attending: Family Medicine | Admitting: Family Medicine

## 2022-04-12 ENCOUNTER — Ambulatory Visit
Admission: RE | Admit: 2022-04-12 | Discharge: 2022-04-12 | Disposition: A | Discharge status: Home / Self Care | Payer: Medicare Other | Source: Ambulatory Visit | Attending: Family Medicine | Admitting: Family Medicine

## 2022-04-12 DIAGNOSIS — N62 Hypertrophy of breast: Secondary | ICD-10-CM

## 2022-04-12 DIAGNOSIS — N6311 Unspecified lump in the right breast, upper outer quadrant: Secondary | ICD-10-CM | POA: Diagnosis not present

## 2022-04-12 DIAGNOSIS — Z08 Encounter for follow-up examination after completed treatment for malignant neoplasm: Secondary | ICD-10-CM | POA: Diagnosis not present

## 2022-04-12 DIAGNOSIS — L218 Other seborrheic dermatitis: Secondary | ICD-10-CM | POA: Diagnosis not present

## 2022-04-12 DIAGNOSIS — D1801 Hemangioma of skin and subcutaneous tissue: Secondary | ICD-10-CM | POA: Diagnosis not present

## 2022-04-12 DIAGNOSIS — L814 Other melanin hyperpigmentation: Secondary | ICD-10-CM | POA: Diagnosis not present

## 2022-04-12 DIAGNOSIS — Z86007 Personal history of in-situ neoplasm of skin: Secondary | ICD-10-CM | POA: Diagnosis not present

## 2022-04-12 DIAGNOSIS — R928 Other abnormal and inconclusive findings on diagnostic imaging of breast: Secondary | ICD-10-CM | POA: Diagnosis not present

## 2022-04-12 DIAGNOSIS — Z85828 Personal history of other malignant neoplasm of skin: Secondary | ICD-10-CM | POA: Diagnosis not present

## 2022-04-12 DIAGNOSIS — L821 Other seborrheic keratosis: Secondary | ICD-10-CM | POA: Diagnosis not present

## 2022-04-12 NOTE — Telephone Encounter (Signed)
Lvm to cb & r/s appt for 12/15 next avail time w/ [facility]

## 2022-05-10 DIAGNOSIS — H906 Mixed conductive and sensorineural hearing loss, bilateral: Secondary | ICD-10-CM | POA: Diagnosis not present

## 2022-05-10 DIAGNOSIS — H9213 Otorrhea, bilateral: Secondary | ICD-10-CM | POA: Diagnosis not present

## 2022-05-10 DIAGNOSIS — H7293 Unspecified perforation of tympanic membrane, bilateral: Secondary | ICD-10-CM | POA: Diagnosis not present

## 2022-05-14 ENCOUNTER — Other Ambulatory Visit: Payer: Self-pay | Admitting: Nurse Practitioner

## 2022-05-17 DIAGNOSIS — I779 Disorder of arteries and arterioles, unspecified: Secondary | ICD-10-CM | POA: Diagnosis not present

## 2022-05-17 DIAGNOSIS — J449 Chronic obstructive pulmonary disease, unspecified: Secondary | ICD-10-CM | POA: Diagnosis not present

## 2022-05-17 DIAGNOSIS — I2510 Atherosclerotic heart disease of native coronary artery without angina pectoris: Secondary | ICD-10-CM | POA: Diagnosis not present

## 2022-05-17 DIAGNOSIS — R7303 Prediabetes: Secondary | ICD-10-CM | POA: Diagnosis not present

## 2022-05-17 DIAGNOSIS — E785 Hyperlipidemia, unspecified: Secondary | ICD-10-CM | POA: Diagnosis not present

## 2022-05-17 DIAGNOSIS — I679 Cerebrovascular disease, unspecified: Secondary | ICD-10-CM | POA: Diagnosis not present

## 2022-05-17 DIAGNOSIS — Z0000 Encounter for general adult medical examination without abnormal findings: Secondary | ICD-10-CM | POA: Diagnosis not present

## 2022-05-17 DIAGNOSIS — E038 Other specified hypothyroidism: Secondary | ICD-10-CM | POA: Diagnosis not present

## 2022-05-17 DIAGNOSIS — I700 Atherosclerosis of aorta: Secondary | ICD-10-CM | POA: Diagnosis not present

## 2022-05-17 DIAGNOSIS — E210 Primary hyperparathyroidism: Secondary | ICD-10-CM | POA: Diagnosis not present

## 2022-05-17 DIAGNOSIS — F330 Major depressive disorder, recurrent, mild: Secondary | ICD-10-CM | POA: Diagnosis not present

## 2022-05-17 DIAGNOSIS — I25119 Atherosclerotic heart disease of native coronary artery with unspecified angina pectoris: Secondary | ICD-10-CM | POA: Diagnosis not present

## 2022-05-20 DIAGNOSIS — R7303 Prediabetes: Secondary | ICD-10-CM | POA: Diagnosis not present

## 2022-05-20 DIAGNOSIS — E038 Other specified hypothyroidism: Secondary | ICD-10-CM | POA: Diagnosis not present

## 2022-05-20 DIAGNOSIS — Z79899 Other long term (current) drug therapy: Secondary | ICD-10-CM | POA: Diagnosis not present

## 2022-05-20 DIAGNOSIS — E785 Hyperlipidemia, unspecified: Secondary | ICD-10-CM | POA: Diagnosis not present

## 2022-05-20 DIAGNOSIS — I779 Disorder of arteries and arterioles, unspecified: Secondary | ICD-10-CM | POA: Diagnosis not present

## 2022-05-24 DIAGNOSIS — Z23 Encounter for immunization: Secondary | ICD-10-CM | POA: Diagnosis not present

## 2022-06-08 ENCOUNTER — Ambulatory Visit (INDEPENDENT_AMBULATORY_CARE_PROVIDER_SITE_OTHER): Payer: Medicare Other

## 2022-06-08 ENCOUNTER — Encounter: Payer: Self-pay | Admitting: Pulmonary Disease

## 2022-06-08 ENCOUNTER — Telehealth: Payer: Self-pay | Admitting: Pulmonary Disease

## 2022-06-08 ENCOUNTER — Ambulatory Visit (INDEPENDENT_AMBULATORY_CARE_PROVIDER_SITE_OTHER): Payer: Medicare Other | Admitting: Pulmonary Disease

## 2022-06-08 VITALS — BP 112/62 | HR 84 | Temp 98.5°F | Ht 71.5 in | Wt 185.0 lb

## 2022-06-08 DIAGNOSIS — R0602 Shortness of breath: Secondary | ICD-10-CM | POA: Diagnosis not present

## 2022-06-08 DIAGNOSIS — I2510 Atherosclerotic heart disease of native coronary artery without angina pectoris: Secondary | ICD-10-CM | POA: Diagnosis not present

## 2022-06-08 DIAGNOSIS — J471 Bronchiectasis with (acute) exacerbation: Secondary | ICD-10-CM

## 2022-06-08 DIAGNOSIS — R059 Cough, unspecified: Secondary | ICD-10-CM | POA: Diagnosis not present

## 2022-06-08 MED ORDER — PREDNISONE 20 MG PO TABS: ORAL_TABLET | ORAL | 0 refills | Status: DC

## 2022-06-08 MED ORDER — AZITHROMYCIN 250 MG PO TABS: ORAL_TABLET | ORAL | 0 refills | Status: DC

## 2022-06-08 NOTE — Patient Instructions (Signed)
Will get a chest x-ray today Prescribe Z-Pak and prednisone 40 mg a day for 5 days Follow-up with [name] in 3 months

## 2022-06-08 NOTE — Progress Notes (Signed)
[name]    [id number]    08-07-1950  Primary Care Physician:Wolters, Ivin Booty, MD  Referring Physician: Jonathon Jordan, MD [address] [place],  [name] [address]  Chief complaint: Acute visit for COPD exacerbation, acute bronchitis  HPI: 71 y.o. 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   He is a patient of [name] with COPD, chronic bronchitis, emphysema and bronchiectasis.  Last seen in clinic in August 2023 by Roxan Diesel when he was feeling well.  Maintained on Breztri and as needed albuterol  Complains of 1 week of worsening dyspnea, cough with green mucus, wheezing.  Denies any fevers, chills He tested negative for COVID on multiple occasions at home.  Outpatient Encounter Medications as of 06/08/2022  Medication Sig   albuterol (PROVENTIL) (2.5 MG/3ML) 0.083% nebulizer solution Take 3 mLs (2.5 mg total) by nebulization every 6 (six) hours as needed for wheezing or shortness of breath.   aspirin 81 MG chewable tablet Chew 81 mg by mouth daily.   Budeson-Glycopyrrol-Formoterol (BREZTRI AEROSPHERE) 160-9-4.8 MCG/ACT AERO Inhale 2 puffs into the lungs. Through patient assistance.   celecoxib (CELEBREX) 200 MG capsule 1 capsule with food as needed   cetirizine (ZYRTEC) 10 MG chewable tablet Chew by mouth.   Docusate Sodium (DSS) 100 MG CAPS Take by mouth.   famotidine (PEPCID) 20 MG tablet Take 1 tablet (20 mg total) by mouth at bedtime.   fluticasone (FLONASE) 50 MCG/ACT nasal spray Place into the nose.   guaiFENesin (MUCINEX) 600 MG 12 hr tablet Take by  mouth.   methylphenidate (RITALIN) 10 MG tablet 1 tablet on empty stomach   metoprolol succinate (TOPROL XL) 25 MG 24 hr tablet Take 1 tablet (25 mg total) by mouth daily.   mirtazapine (REMERON) 15 MG tablet Take by mouth.   montelukast (SINGULAIR) 10 MG tablet TAKE 1 TABLET BY MOUTH AT BEDTIME   Multiple Vitamin (MULTIVITAMIN) tablet Take 1 tablet by mouth daily.   omeprazole (PRILOSEC) 40 MG capsule Take 40 mg by mouth every morning.   polyethylene glycol (MIRALAX / GLYCOLAX) packet Take 17 g by mouth daily as needed (for constipation.).    rosuvastatin (CRESTOR) 10 MG tablet TAKE 1 TABLET(10 MG) BY MOUTH DAILY   SYNTHROID 25 MCG tablet Take 25 mcg by mouth daily before breakfast.    tamsulosin (FLOMAX) 0.4 MG CAPS capsule Take 0.4 mg by mouth daily after breakfast.    No facility-administered encounter medications on file as of 06/08/2022.   Physical Exam: Blood pressure 112/62, pulse 84, temperature 98.5 F (36.9 C), temperature source Oral, height 5' 11.5" (1.816 m), weight 185 lb (83.9 kg), SpO2 96 %. Gen:      No acute distress HEENT:  EOMI, sclera anicteric Neck:     No masses; no thyromegaly Lungs:    Clear to auscultation bilaterally; normal respiratory effort CV:         Regular rate and rhythm; no murmurs Abd:      + bowel sounds; soft, non-tender; no palpable masses, no distension Ext:    No edema; adequate peripheral perfusion Skin:      Warm and dry; no rash Neuro: alert and  oriented x 3 Psych: normal mood and affect  Data reviewed: CT chest 11/10/2021-minimal emphysema, centrilobular nodules  PFTs 07/31/21-moderate restriction  Assessment/Plan: Emphysema with acute exacerbation, acute bronchitis Continue Breztri inhaler Use over-the-counter Delsym, Mucinex for mucociliary clearance Obtain chest x-ray Prescribe Z-Pak and prednisone 40 mg a day for 5 days.  [name] MD [facility] 06/08/2022, 2:52 PM  CC: Jonathon Jordan, MD

## 2022-06-09 ENCOUNTER — Encounter: Payer: Self-pay | Admitting: Pulmonary Disease

## 2022-06-09 NOTE — Telephone Encounter (Signed)
[facility] and spoke with Smitty Cords, provided clarification on the order for prednisone (40 mg daily for 5 days) and Azithromycin (Zpack), 2 tabs the first day and then 1 tab daily until gone.  Nothing further needed.

## 2022-06-09 NOTE — Telephone Encounter (Signed)
[facility] checking on message sent. [facility] phone number is [phone].

## 2022-06-21 ENCOUNTER — Ambulatory Visit: Payer: Medicare Other | Admitting: Podiatry

## 2022-07-12 DIAGNOSIS — Z974 Presence of external hearing-aid: Secondary | ICD-10-CM | POA: Diagnosis not present

## 2022-07-12 DIAGNOSIS — H7293 Unspecified perforation of tympanic membrane, bilateral: Secondary | ICD-10-CM | POA: Diagnosis not present

## 2022-07-12 DIAGNOSIS — H906 Mixed conductive and sensorineural hearing loss, bilateral: Secondary | ICD-10-CM | POA: Diagnosis not present

## 2022-07-17 ENCOUNTER — Other Ambulatory Visit: Payer: Self-pay | Admitting: Cardiovascular Disease

## 2022-07-20 NOTE — Progress Notes (Signed)
$'@Patient'm$  ID: [name], male    DOB: 05-08-1951, 72 y.o.   MRN: [id number]  Chief Complaint  Patient presents with   Acute Visit    Coughing,SOB for about a week  Mucus yellow    Referring provider: Jonathon Jordan, MD  HPI: 72 y.o. 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   Previous [facility] pulmonary encounter:  06/08/22- Dr. Vaughan Browner, Acute visit  He is a patient of [name] with COPD, chronic bronchitis, emphysema and bronchiectasis.  Last seen in clinic in August 2023 by Roxan Diesel when he was feeling well.  Maintained on Breztri and as needed albuterol  Complains of 1 week of worsening dyspnea, cough with green mucus, wheezing.  Denies any fevers, chills He tested negative for COVID on multiple occasions at home.   07/21/2022 Patient presents today for acute visit/cough. PMH significant for COPD with asthma, emphysema, bronchiectasis, chronic sinusitis, chronic respiratory failure. Treated for acute exacerbation in early December with azithromycin and prednisone '40mg'$  x 5 days.   He has a chronic dry cough normally. He developed post nasal drip symptoms and productive cough with yellow mucus 6 days ago. He takes mucinex twice a day. He is a little more winded with exertion. He looks after his grandchildren 3-4 days a week. He takes breztri twice daily, feel this has significant helped his breathing overall and cut down on the about of flare ups he has had. He has not needed to use his Albuterol inhaler. He is up to date with covid booster. Denies fever, chills, sweats, N/V/D.     Allergies  Allergen Reactions   Bupropion Other (See Comments)    Caused altered mental status Altered mental status Caused altered mental status   Ciprofloxacin-Dexamethasone Other (See Comments)    Burning in ears when Cipro ear drops were used unknown Burning in ears when Cipro ear drops were used   Gabapentin Other (See Comments)    Caused weakness, dizziness, and forgetfulness  Weakness and dizziness with higher doses. Weakness and dizziness Caused weakness, dizziness, and forgetfulness   Ciprofloxacin Hcl Other (See Comments)    Burning in ears when drops were used   Clonidine Hcl Other (See Comments)    Immunization History  Administered Date(s) Administered   H1N1 04/25/2008   Influenza Split 04/25/2008, 04/08/2009, 04/03/2010, 04/02/2011, 04/18/2012, 04/27/2013, 04/05/2015, 03/30/2019   Influenza, High Dose Seasonal PF 03/30/2016   Influenza,inj,Quad PF,6+ Mos 04/02/2016, 03/03/2017, 04/28/2018, 04/04/2019, 03/20/2020   Influenza,inj,quad, With Preservative 04/15/2015   Influenza-Unspecified 03/26/2021   PFIZER(Purple Top)SARS-COV-2 Vaccination 08/12/2019, 09/05/2019, 04/15/2020, 10/13/2020   Pfizer Covid-19 Vaccine Bivalent Booster 71yr & up 03/17/2021   Pneumococcal Conjugate-13 05/08/2015   Pneumococcal Polysaccharide-23 10/24/2007, 03/03/2017   Td 03/26/2004   Tdap 03/13/2013   Zoster, Live 03/13/2013, 05/25/2021    Past Medical History:  Diagnosis Date   Arthritis    Bronchitis    history of   COPD with asthma 12/01/2021   Dysrhythmia    ":Due to MVP"   GERD (gastroesophageal reflux disease)    Hiatal hernia    Hip joint replacement by other means 2004   History of echocardiogram    Echo 11/2019: EF 65-70, no R WMA, mild concentric  LVH, normal RV SF, normal PASP (RVSP 19.2 mmHg), trivial MR   Hypercalcemia    Lipoma    left forearm (3)   Malignant melanoma of skin ([place]) 06/23/2020   Mitral valve prolapse    does not see cardiologist for.  last stress test 2003   Pneumonia 2009   Primary immune deficiency disorder ([place])    Tumor cells, benign    lung, left side   Unstable angina ([place]) 10/28/2017    Tobacco History: Social History   Tobacco Use  Smoking Status Former   Packs/day: 1.50   Years: 24.00   Total pack years: 36.00   Types: Cigarettes   Start date: 1966   Quit date: 07/05/1985   Years since quitting: 37.0   Passive exposure: Never  Smokeless Tobacco Never   Counseling given: Not Answered   Outpatient Medications Prior to Visit  Medication Sig Dispense Refill   aspirin 81 MG chewable tablet Chew 81 mg by mouth daily.     azithromycin (ZITHROMAX) 250 MG tablet Take as directed 6 tablet 0   Budeson-Glycopyrrol-Formoterol (BREZTRI AEROSPHERE) 160-9-4.8 MCG/ACT AERO Inhale 2 puffs into the lungs. Through patient assistance.     celecoxib (CELEBREX) 200 MG capsule 1 capsule with food as needed     cetirizine (ZYRTEC) 10 MG chewable tablet Chew by mouth.     Docusate Sodium (DSS) 100 MG CAPS Take by mouth.     famotidine (PEPCID) 20 MG tablet Take 1 tablet (20 mg total) by mouth at bedtime. 30 tablet 3   fluticasone (FLONASE) 50 MCG/ACT nasal spray Place into the nose.     guaiFENesin (MUCINEX) 600 MG 12 hr tablet Take by mouth.     methylphenidate (RITALIN) 10 MG tablet 1 tablet on empty stomach     metoprolol succinate (TOPROL-XL) 25 MG 24 hr tablet Take 1 tablet (25 mg total) by mouth daily. 30 tablet 0   mirtazapine (REMERON) 15 MG tablet Take by mouth.     montelukast (SINGULAIR) 10 MG tablet TAKE 1 TABLET BY MOUTH AT BEDTIME 90 tablet 3   Multiple Vitamin (MULTIVITAMIN) tablet Take 1 tablet by mouth daily.     omeprazole (PRILOSEC) 40 MG capsule Take 40 mg by mouth every morning.     polyethylene glycol (MIRALAX / GLYCOLAX) packet Take 17 g by mouth daily as needed (for constipation.).      rosuvastatin (CRESTOR) 10 MG tablet TAKE 1 TABLET(10 MG) BY MOUTH DAILY 90 tablet 3   SYNTHROID 25 MCG tablet  Take 25 mcg by mouth daily before breakfast.      tamsulosin (FLOMAX) 0.4 MG CAPS capsule Take 0.4 mg by mouth daily after breakfast.      albuterol (PROVENTIL) (2.5 MG/3ML) 0.083% nebulizer solution Take 3 mLs (2.5 mg total) by nebulization every 6 (six) hours as needed for wheezing or shortness of breath. (Patient not taking: Reported on 07/21/2022) 75 mL 5   predniSONE (DELTASONE) 20 MG tablet Take '40mg'$  for 5 days (Patient not taking: Reported on 07/21/2022) 10 tablet 0   No facility-administered medications prior to visit.   Review of Systems  Review of Systems  Constitutional: Negative.   HENT: Negative.    Respiratory: Negative.    Cardiovascular: Negative.     Physical Exam  BP 112/70 (BP Location: Left Arm, Patient Position: Sitting, Cuff Size: Normal)   Pulse 83   Temp (!) 97.3 F (36.3 C) (Temporal)   Ht '5\' 11"'$  (1.803 m)   Wt 182 lb (82.6 kg)  SpO2 96%   BMI 25.38 kg/m  Physical Exam Constitutional:      General: He is not in acute distress.    Appearance: He is not ill-appearing.  HENT:     Head: Normocephalic and atraumatic.     Mouth/Throat:     Mouth: Mucous membranes are moist.     Pharynx: Oropharynx is clear.  Cardiovascular:     Rate and Rhythm: Normal rate and regular rhythm.  Pulmonary:     Effort: Pulmonary effort is normal.     Breath sounds: Normal breath sounds. No wheezing, rhonchi or rales.     Comments: Lungs clear; Reactive cough  Skin:    General: Skin is warm and dry.  Neurological:     General: No focal deficit present.     Mental Status: He is alert and oriented to person, place, and time. Mental status is at baseline.  Psychiatric:        Mood and Affect: Mood normal.        Behavior: Behavior normal.        Thought Content: Thought content normal.        Judgment: Judgment normal.      Lab Results:  CBC    Component Value Date/Time   WBC 11.3 (H) 11/17/2021 1159   RBC 4.68 11/17/2021 1159   HGB 15.1 11/17/2021 1159   HGB  15.4 08/17/2019 1517   HCT 44.8 11/17/2021 1159   PLT 161.0 11/17/2021 1159   PLT 112 (L) 08/17/2019 1517   MCV 95.7 11/17/2021 1159   MCH 30.5 08/17/2019 1517   MCHC 33.7 11/17/2021 1159   RDW 12.8 11/17/2021 1159   LYMPHSABS 1.9 11/17/2021 1159   MONOABS 0.7 11/17/2021 1159   EOSABS 0.2 11/17/2021 1159   BASOSABS 0.0 11/17/2021 1159    BMET    Component Value Date/Time   NA 139 11/17/2021 1159   NA 142 02/13/2018 0851   K 4.3 11/17/2021 1159   CL 103 11/17/2021 1159   CO2 28 11/17/2021 1159   GLUCOSE 92 11/17/2021 1159   BUN 22 11/17/2021 1159   BUN 16 02/13/2018 0851   CREATININE 1.15 11/17/2021 1159   CREATININE 1.01 08/17/2019 1517   CALCIUM 8.9 11/17/2021 1159   CALCIUM 9.0 02/19/2011 1233   GFRNONAA >60 08/17/2019 1517   GFRAA >60 08/17/2019 1517    BNP    Component Value Date/Time   BNP 42.2 06/19/2019 0902    ProBNP    Component Value Date/Time   PROBNP 41.0 11/17/2021 1159    Imaging: No results found.   Assessment & Plan:   Acute exacerbation of COPD with asthma ([place]) - Developed productive cough 6 days ago with yellow mucus. He has a reactive cough and mild dyspnea with activity. Lungs clear on exam. Holding off on chest imaging today, he had a normal CXR in December 2023. Rx Doxycycline '100mg'$  twice daily x 7 days and prednisone '40mg'$  x 5 days. Continue Breztri Aerosphere two puffs twice daily. Recommend patient take mucinex-dm twice daily for 7-10 days. FU if symptoms do not improved.    Martyn Ehrich, NP 07/21/2022

## 2022-07-21 ENCOUNTER — Encounter: Payer: Self-pay | Admitting: Primary Care

## 2022-07-21 ENCOUNTER — Ambulatory Visit (INDEPENDENT_AMBULATORY_CARE_PROVIDER_SITE_OTHER): Payer: Medicare Other | Admitting: Primary Care

## 2022-07-21 VITALS — BP 112/70 | HR 83 | Temp 97.3°F | Ht 71.0 in | Wt 182.0 lb

## 2022-07-21 DIAGNOSIS — J441 Chronic obstructive pulmonary disease with (acute) exacerbation: Secondary | ICD-10-CM | POA: Diagnosis not present

## 2022-07-21 DIAGNOSIS — J45901 Unspecified asthma with (acute) exacerbation: Secondary | ICD-10-CM

## 2022-07-21 MED ORDER — DOXYCYCLINE HYCLATE 100 MG PO TABS: 100.0000 mg | ORAL_TABLET | Freq: Two times a day (BID) | ORAL | 0 refills | Status: DC

## 2022-07-21 MED ORDER — PREDNISONE 20 MG PO TABS: ORAL_TABLET | ORAL | 0 refills | Status: DC

## 2022-07-21 NOTE — Assessment & Plan Note (Addendum)
-  Developed productive cough 6 days ago with yellow mucus. He has a reactive cough and mild dyspnea with activity. Lungs clear on exam. Holding off on chest imaging today, he had a normal CXR in December 2023. Rx Doxycycline '100mg'$  twice daily x 7 days and prednisone '40mg'$  x 5 days. Continue Breztri Aerosphere two puffs twice daily. Recommend patient take mucinex-dm twice daily for 7-10 days. FU if symptoms do not improved.

## 2022-07-21 NOTE — Patient Instructions (Signed)
Recommendations: Start mucinex-dm twice a day x 7-10 days Continue Breztri aerosphere two puffs morning and evening Use albuterol as needed for breakthrough shortness of breath/wheezing  Sending in abx and prednisone  Follow-up Call if symptoms are not better

## 2022-08-09 ENCOUNTER — Ambulatory Visit: Payer: Medicare Other | Admitting: Podiatry

## 2022-08-09 ENCOUNTER — Other Ambulatory Visit: Payer: Self-pay

## 2022-08-09 ENCOUNTER — Ambulatory Visit (INDEPENDENT_AMBULATORY_CARE_PROVIDER_SITE_OTHER): Payer: Medicare Other | Admitting: Podiatry

## 2022-08-09 ENCOUNTER — Encounter: Payer: Self-pay | Admitting: Podiatry

## 2022-08-09 VITALS — BP 108/68

## 2022-08-09 DIAGNOSIS — B351 Tinea unguium: Secondary | ICD-10-CM

## 2022-08-09 DIAGNOSIS — M79675 Pain in left toe(s): Secondary | ICD-10-CM | POA: Diagnosis not present

## 2022-08-09 DIAGNOSIS — M79674 Pain in right toe(s): Secondary | ICD-10-CM

## 2022-08-09 MED ORDER — BREZTRI AEROSPHERE 160-9-4.8 MCG/ACT IN AERO: 2.0000 | INHALATION_SPRAY | Freq: Two times a day (BID) | RESPIRATORY_TRACT | 0 refills | Status: DC

## 2022-08-09 NOTE — Progress Notes (Signed)
Opened encounter to print [facility] prescription for patient assistance.   Closing encounter

## 2022-08-09 NOTE — Progress Notes (Signed)
  Subjective:  Patient ID: [name], male    DOB: 1950/12/09,  MRN: [id number]  [name] presents to clinic today for painful thick toenails that are difficult to trim. Pain interferes with ambulation. Aggravating factors include wearing enclosed shoe gear. Pain is relieved with periodic professional debridement.  Chief Complaint  Patient presents with   Nail Problem    RFC PCP-Wolters PCP VST-2 months ago   New problem(s): None.   PCP is Jonathon Jordan, MD.  Allergies  Allergen Reactions   Bupropion Other (See Comments)    Caused altered mental status Altered mental status Caused altered mental status   Ciprofloxacin-Dexamethasone Other (See Comments)    Burning in ears when Cipro ear drops were used unknown Burning in ears when Cipro ear drops were used   Gabapentin Other (See Comments)    Caused weakness, dizziness, and forgetfulness  Weakness and dizziness with higher doses. Weakness and dizziness Caused weakness, dizziness, and forgetfulness   Ciprofloxacin Hcl Other (See Comments)    Burning in ears when drops were used   Clonidine Hcl Other (See Comments)    Review of Systems: Negative except as noted in the HPI.  Objective: No changes noted in today's physical examination. Vitals:   08/09/22 0851  BP: 108/68   [name] is a pleasant 72 y.o. male WD, WN in NAD. AAO x 3.  Vascular Examination: Vascular status intact b/l with palpable pedal pulses. CFT immediate b/l. Pedal hair present.No edema. No pain with calf compression b/l. Skin temperature gradient WNL b/l. No varicosities noted.  Neurological Examination: Sensation grossly intact b/l with 10 gram monofilament. Vibratory sensation intact b/l.  Dermatological Examination: Pedal skin with normal turgor, texture and tone b/l. No open wounds nor interdigital macerations noted.   Toenails 1-5 b/l thick, discolored, elongated with subungual debris and pain on dorsal palpation. No  hyperkeratotic lesions noted b/l.   Musculoskeletal Examination: Muscle strength 5/5 to b/l LE.  No pain, crepitus noted b/l. No gross pedal deformities. Patient ambulates independently without assistive aids.   Radiographs: None Assessment/Plan: 1. Pain due to onychomycosis of toenails of both feet     -Consent given for treatment as described below: -Examined patient. -Patient to continue soft, supportive shoe gear daily. -Toenails 1-5 b/l were debrided in length and girth with sterile nail nippers and dremel without iatrogenic bleeding.  -Patient/POA to call should there be question/concern in the interim.   Return in about 10 weeks (around 10/18/2022).  Marzetta Board, DPM

## 2022-09-13 DIAGNOSIS — H6123 Impacted cerumen, bilateral: Secondary | ICD-10-CM | POA: Insufficient documentation

## 2022-10-07 ENCOUNTER — Other Ambulatory Visit: Payer: Self-pay | Admitting: Cardiovascular Disease

## 2022-10-12 DIAGNOSIS — Z08 Encounter for follow-up examination after completed treatment for malignant neoplasm: Secondary | ICD-10-CM | POA: Diagnosis not present

## 2022-10-12 DIAGNOSIS — L814 Other melanin hyperpigmentation: Secondary | ICD-10-CM | POA: Diagnosis not present

## 2022-10-12 DIAGNOSIS — Z86007 Personal history of in-situ neoplasm of skin: Secondary | ICD-10-CM | POA: Diagnosis not present

## 2022-10-12 DIAGNOSIS — D225 Melanocytic nevi of trunk: Secondary | ICD-10-CM | POA: Diagnosis not present

## 2022-10-12 DIAGNOSIS — L821 Other seborrheic keratosis: Secondary | ICD-10-CM | POA: Diagnosis not present

## 2022-10-12 DIAGNOSIS — Z85828 Personal history of other malignant neoplasm of skin: Secondary | ICD-10-CM | POA: Diagnosis not present

## 2022-11-05 ENCOUNTER — Ambulatory Visit (INDEPENDENT_AMBULATORY_CARE_PROVIDER_SITE_OTHER): Payer: Medicare Other | Admitting: Podiatry

## 2022-11-05 ENCOUNTER — Encounter: Payer: Self-pay | Admitting: Podiatry

## 2022-11-05 VITALS — BP 125/63

## 2022-11-05 DIAGNOSIS — M79674 Pain in right toe(s): Secondary | ICD-10-CM

## 2022-11-05 DIAGNOSIS — B351 Tinea unguium: Secondary | ICD-10-CM | POA: Diagnosis not present

## 2022-11-05 DIAGNOSIS — M79675 Pain in left toe(s): Secondary | ICD-10-CM

## 2022-11-05 NOTE — Progress Notes (Signed)
  Subjective:  Patient ID: [name], male    DOB: Jul 07, 1950,  MRN: [id number]  [name] presents to clinic today for thick, elongated toenails both feet which are tender when wearing enclosed shoe gear.  Chief Complaint  Patient presents with   Nail Problem    RFC PCP-Wolters PCP VST- 1 month ago   New problem(s): None.   PCP is [name], MD.  Allergies  Allergen Reactions   Bupropion Other (See Comments)    Caused altered mental status Altered mental status Caused altered mental status   Ciprofloxacin-Dexamethasone Other (See Comments)    Burning in ears when Cipro ear drops were used unknown Burning in ears when Cipro ear drops were used   Gabapentin Other (See Comments)    Caused weakness, dizziness, and forgetfulness  Weakness and dizziness with higher doses. Weakness and dizziness Caused weakness, dizziness, and forgetfulness   Ciprofloxacin Hcl Other (See Comments)    Burning in ears when drops were used   Clonidine Hcl Other (See Comments)    Review of Systems: Negative except as noted in the HPI.  Objective: No changes noted in today's physical examination. Vitals:   11/05/22 0756  BP: 125/63   [name] is a pleasant 72 y.o. male WD, WN in NAD. AAO x 3.  Vascular Examination: Vascular status intact b/l with palpable pedal pulses. CFT immediate b/l. Pedal hair present.No edema. No pain with calf compression b/l. Skin temperature gradient WNL b/l. No varicosities noted.  Neurological Examination: Sensation grossly intact b/l with 10 gram monofilament. Vibratory sensation intact b/l.  Dermatological Examination: Pedal skin with normal turgor, texture and tone b/l. No open wounds nor interdigital macerations noted.   Toenails 1-5 b/l thick, discolored, elongated with subungual debris and pain on dorsal palpation. No hyperkeratotic lesions noted b/l.   Musculoskeletal Examination: Muscle strength 5/5 to b/l LE.  No pain,  crepitus noted b/l. No gross pedal deformities. Patient ambulates independently without assistive aids.   Radiographs: None  Assessment/Plan: 1. Pain due to onychomycosis of toenails of both feet     No orders of the defined types were placed in this encounter.  -Patient was evaluated and treated. All patient's and/or POA's questions/concerns answered on today's visit. -Continue supportive shoe gear daily. -Mycotic toenails 1-5 bilaterally were debrided in length and girth with sterile nail nippers and dremel without incident. -Patient/POA to call should there be question/concern in the interim.   Return in about 10 weeks (around 01/14/2023).  Freddie Breech, DPM

## 2022-11-08 ENCOUNTER — Encounter: Payer: Self-pay | Admitting: Pulmonary Disease

## 2022-11-08 ENCOUNTER — Ambulatory Visit (INDEPENDENT_AMBULATORY_CARE_PROVIDER_SITE_OTHER): Payer: Medicare Other

## 2022-11-08 ENCOUNTER — Ambulatory Visit (INDEPENDENT_AMBULATORY_CARE_PROVIDER_SITE_OTHER): Payer: Medicare Other | Admitting: Pulmonary Disease

## 2022-11-08 VITALS — BP 112/72 | HR 88 | Temp 97.9°F | Ht 70.0 in | Wt 182.6 lb

## 2022-11-08 DIAGNOSIS — R051 Acute cough: Secondary | ICD-10-CM

## 2022-11-08 DIAGNOSIS — R0602 Shortness of breath: Secondary | ICD-10-CM | POA: Diagnosis not present

## 2022-11-08 DIAGNOSIS — R059 Cough, unspecified: Secondary | ICD-10-CM | POA: Diagnosis not present

## 2022-11-08 MED ORDER — PREDNISONE 20 MG PO TABS: 40.0000 mg | ORAL_TABLET | Freq: Every day | ORAL | 0 refills | Status: DC

## 2022-11-08 MED ORDER — DOXYCYCLINE HYCLATE 100 MG PO TABS: 100.0000 mg | ORAL_TABLET | Freq: Two times a day (BID) | ORAL | 0 refills | Status: DC

## 2022-11-08 NOTE — Progress Notes (Signed)
[name]    [id number]    1951-06-22  Primary Care Physician:Gomez, Richard December, MD  Referring Physician: [name], MD [address] [place],  [place] [address]  Chief complaint:   With a history of COPD in with an acute cough  HPI:  Cough of 2 days duration with expectoration of yellow-colored phlegm Has not had any fevers or chills Some burning in his chest  Symptoms have been better controlled since being on Breztri  History of COPD, asthma, mitral valve prolapse, bronchiectasis, emphysema  Maintained on Breztri Outpatient Encounter Medications as of 11/08/2022  Medication Sig   albuterol (PROVENTIL) (2.5 MG/3ML) 0.083% nebulizer solution Take 3 mLs (2.5 mg total) by nebulization every 6 (six) hours as needed for wheezing or shortness of breath.   aspirin 81 MG chewable tablet Chew 81 mg by mouth daily.   Budeson-Glycopyrrol-Formoterol (BREZTRI AEROSPHERE) 160-9-4.8 MCG/ACT AERO Inhale 2 puffs into the lungs 2 (two) times daily. Through patient assistance.   celecoxib (CELEBREX) 200 MG capsule 1 capsule with food as needed   cetirizine (ZYRTEC) 10 MG chewable tablet Chew by mouth.   Docusate Sodium (DSS) 100 MG CAPS Take by mouth.   doxycycline (VIBRA-TABS) 100 MG tablet Take 1 tablet (100 mg total) by mouth 2 (two) times daily.   famotidine (PEPCID) 20 MG tablet Take 1 tablet (20 mg total) by mouth at bedtime.   fluticasone (FLONASE) 50 MCG/ACT nasal spray Place into the nose.   guaiFENesin (MUCINEX) 600 MG 12 hr tablet Take by mouth.   methylphenidate (RITALIN) 10 MG tablet 1 tablet on empty stomach   metoprolol succinate (TOPROL-XL) 25 MG 24 hr tablet TAKE 1 TABLET DAILY   mirtazapine (REMERON) 15 MG tablet Take by mouth.   montelukast (SINGULAIR) 10 MG tablet TAKE 1 TABLET BY MOUTH AT BEDTIME   Multiple Vitamin (MULTIVITAMIN) tablet Take 1 tablet by mouth daily.   omeprazole (PRILOSEC) 40 MG capsule Take 40 mg by mouth every morning.    polyethylene glycol (MIRALAX / GLYCOLAX) packet Take 17 g by mouth daily as needed (for constipation.).    predniSONE (DELTASONE) 20 MG tablet Take 2 tablets (40 mg total) by mouth daily with breakfast.   rosuvastatin (CRESTOR) 10 MG tablet TAKE 1 TABLET(10 MG) BY MOUTH DAILY   SYNTHROID 25 MCG tablet Take 25 mcg by mouth daily before breakfast.    tamsulosin (FLOMAX) 0.4 MG CAPS capsule Take 0.4 mg by mouth daily after breakfast.    [DISCONTINUED] azithromycin (ZITHROMAX) 250 MG tablet Take as directed   [DISCONTINUED] doxycycline (VIBRA-TABS) 100 MG tablet Take 1 tablet (100 mg total) by mouth 2 (two) times daily.   [DISCONTINUED] predniSONE (DELTASONE) 20 MG tablet Take 40mg  for 5 days   No facility-administered encounter medications on file as of 11/08/2022.    Allergies as of 11/08/2022 - Review Complete 11/08/2022  Allergen Reaction Noted   Bupropion Other (See Comments) 10/28/2017   Ciprofloxacin-dexamethasone Other (See Comments) 09/22/2015   Gabapentin Other (See Comments) 10/02/2015   Ciprofloxacin hcl Other (See Comments) 05/31/2011   Clonidine hcl Other (See Comments) 06/15/2020    Past Medical History:  Diagnosis Date   Arthritis    Bronchitis    history of   COPD with asthma 12/01/2021   Dysrhythmia    ":Due to MVP"   GERD (gastroesophageal reflux disease)    Hiatal hernia    Hip joint replacement by other means 2004   History of echocardiogram  Echo 11/2019: EF 65-70, no R WMA, mild concentric LVH, normal RV SF, normal PASP (RVSP 19.2 mmHg), trivial MR   Hypercalcemia    Lipoma    left forearm (3)   Malignant melanoma of skin (HCC) 06/23/2020   Mitral valve prolapse    does not see cardiologist for. last stress test 2003   Pneumonia 2009   Primary immune deficiency disorder [facility])    Tumor cells, benign    lung, left side   Unstable angina (HCC) 10/28/2017    Past Surgical History:  Procedure Laterality Date   APPENDECTOMY  1984   CATARACT EXTRACTION      L and R eye   CHOLECYSTECTOMY  2002   HYDROCELE EXCISION Right 11/04/2016   Procedure: HYDROCELECTOMY ADULT;  Surgeon: Marcine Matar, MD;  Location: [facility];  Service: Urology;  Laterality: Right;   INGUINAL HERNIA REPAIR Bilateral 11/04/2016   Procedure: HERNIA REPAIR INGUINAL ADULT BILATERAL;  Surgeon: Avel Peace, MD;  Location: [facility];  Service: General;  Laterality: Bilateral;   INSERTION OF MESH Bilateral 11/04/2016   Procedure: INSERTION OF MESH;  Surgeon: Avel Peace, MD;  Location: [facility];  Service: General;  Laterality: Bilateral;   JOINT REPLACEMENT     Lt hip   LEFT HEART CATH AND CORONARY ANGIOGRAPHY N/A 10/31/2017   Procedure: LEFT HEART CATH AND CORONARY ANGIOGRAPHY;  Surgeon: Lennette Bihari, MD;  Location: [facility];  Service: Cardiovascular;  Laterality: N/A;   MASTOIDECTOMY  1996   NASAL SEPTUM SURGERY     sinus surgery   PARATHYROIDECTOMY     TOTAL HIP REVISION  06/14/2011   Procedure: TOTAL HIP REVISION;  Surgeon: Nestor Lewandowsky;  Location: [facility] OR;  Service: Orthopedics;  Laterality: Left;  Left Acetabular  Hip Revision    Family History  Problem Relation Age of Onset   Heart Problems Mother    Lung cancer Father 88       smoked   Heart Problems Father    Neuropathy Sister    Cancer Brother        prostate   Lupus Daughter    Healthy Daughter    Multiple sclerosis Son     Social History   Socioeconomic History   Marital status: Married    Spouse name: [name]   Number of children: 3   Years of education: masters   Highest education level: Master's degree (e.g., MA, MS, MEng, MEd, MSW, MBA)  Occupational History    Comment: Sales   Occupation: Retired  Tobacco Use   Smoking status: Former    Packs/day: 1.50    Years: 24.00    Additional pack years: 0.00    Total pack years: 36.00    Types: Cigarettes    Start date: 1966    Quit date: 07/05/1985    Years since quitting: 37.3    Passive exposure: Never   Smokeless tobacco: Never   Vaping Use   Vaping Use: Never used  Substance and Sexual Activity   Alcohol use: Yes    Comment: occ   Drug use: No   Sexual activity: Not on file  Other Topics Concern   Not on file  Social History Narrative   Patient lives at home with his wife Chyrl Civatte) Patient is Transport planner. Patient has his masters.   Right handed.   Caffeine - one cup daily.   Social Determinants of Health   Financial Resource Strain: Not on file  Food Insecurity: Not on file  Transportation Needs: Not on file  Physical Activity: Not on file  Stress: Not on file  Social Connections: Not on file  Intimate Partner Violence: Not on file    Review of Systems  Respiratory:  Positive for cough and shortness of breath.     Vitals:   11/08/22 1428  BP: 112/72  Pulse: 88  Temp: 97.9 F (36.6 C)  SpO2: 96%     Physical Exam Constitutional:      Appearance: Normal appearance.  HENT:     Head: Normocephalic.     Mouth/Throat:     Mouth: Mucous membranes are moist.  Cardiovascular:     Rate and Rhythm: Normal rate and regular rhythm.     Heart sounds: No murmur heard.    No friction rub.  Pulmonary:     Effort: No respiratory distress.     Breath sounds: No stridor. No wheezing or rhonchi.  Musculoskeletal:     Cervical back: No rigidity or tenderness.  Neurological:     Mental Status: He is alert.  Psychiatric:        Mood and Affect: Mood normal.      Data Reviewed: Recent chest x-ray December 2023 shows no acute infiltrate-reviewed by myself  PFT with mild obstructive disease Assessment:  COPD with exacerbation  Bronchiectasis with exacerbation  Usually well-controlled with Breztri  Plan/Recommendations: Prescription for a course of doxycycline for 7 days  Prescription for prednisone 40 mg daily for 5 days  Obtain a chest x-ray today -Will update patient with chest x-ray findings  Encouraged to give [place] a call with any significant concerns  Keep scheduled  follow-up  Chest x-ray performed today shows no acute infiltrate reviewed by myself Virl Diamond MD [facility] 11/08/2022, 2:52 PM  CC: Richard Palmer, MD

## 2022-11-08 NOTE — Patient Instructions (Signed)
Prescription for antibiotics and steroids sent to pharmacy for you  We will get a chest x-ray today to make sure we are not missing a pneumonia  Call us with significant concerns  Keep prior appointment  We will let you know what the x-ray shows

## 2022-11-09 DIAGNOSIS — Z961 Presence of intraocular lens: Secondary | ICD-10-CM | POA: Diagnosis not present

## 2022-11-09 DIAGNOSIS — H43813 Vitreous degeneration, bilateral: Secondary | ICD-10-CM | POA: Diagnosis not present

## 2022-11-11 ENCOUNTER — Encounter: Payer: Self-pay | Admitting: Pulmonary Disease

## 2022-11-15 DIAGNOSIS — J441 Chronic obstructive pulmonary disease with (acute) exacerbation: Secondary | ICD-10-CM | POA: Diagnosis not present

## 2022-11-15 DIAGNOSIS — E038 Other specified hypothyroidism: Secondary | ICD-10-CM | POA: Diagnosis not present

## 2022-11-15 DIAGNOSIS — C439 Malignant melanoma of skin, unspecified: Secondary | ICD-10-CM | POA: Diagnosis not present

## 2022-11-15 DIAGNOSIS — R7303 Prediabetes: Secondary | ICD-10-CM | POA: Diagnosis not present

## 2022-11-15 DIAGNOSIS — R413 Other amnesia: Secondary | ICD-10-CM | POA: Diagnosis not present

## 2022-11-15 DIAGNOSIS — F330 Major depressive disorder, recurrent, mild: Secondary | ICD-10-CM | POA: Diagnosis not present

## 2022-11-15 DIAGNOSIS — I679 Cerebrovascular disease, unspecified: Secondary | ICD-10-CM | POA: Diagnosis not present

## 2022-11-15 DIAGNOSIS — I700 Atherosclerosis of aorta: Secondary | ICD-10-CM | POA: Diagnosis not present

## 2022-11-15 DIAGNOSIS — I779 Disorder of arteries and arterioles, unspecified: Secondary | ICD-10-CM | POA: Diagnosis not present

## 2022-11-15 DIAGNOSIS — I25119 Atherosclerotic heart disease of native coronary artery with unspecified angina pectoris: Secondary | ICD-10-CM | POA: Diagnosis not present

## 2022-11-15 DIAGNOSIS — R432 Parageusia: Secondary | ICD-10-CM | POA: Diagnosis not present

## 2022-11-17 ENCOUNTER — Emergency Department (HOSPITAL_COMMUNITY)
Admission: EM | Admit: 2022-11-17 | Discharge: 2022-11-17 | Disposition: A | Discharge status: Home / Self Care | Payer: Medicare Other | Attending: Emergency Medicine | Admitting: Emergency Medicine

## 2022-11-17 ENCOUNTER — Other Ambulatory Visit: Payer: Self-pay

## 2022-11-17 ENCOUNTER — Encounter (HOSPITAL_COMMUNITY): Payer: Self-pay

## 2022-11-17 ENCOUNTER — Emergency Department (HOSPITAL_COMMUNITY): Payer: Medicare Other

## 2022-11-17 DIAGNOSIS — R350 Frequency of micturition: Secondary | ICD-10-CM | POA: Diagnosis not present

## 2022-11-17 DIAGNOSIS — Z96649 Presence of unspecified artificial hip joint: Secondary | ICD-10-CM | POA: Diagnosis not present

## 2022-11-17 DIAGNOSIS — J449 Chronic obstructive pulmonary disease, unspecified: Secondary | ICD-10-CM | POA: Insufficient documentation

## 2022-11-17 DIAGNOSIS — M542 Cervicalgia: Secondary | ICD-10-CM | POA: Insufficient documentation

## 2022-11-17 DIAGNOSIS — W208XXA Other cause of strike by thrown, projected or falling object, initial encounter: Secondary | ICD-10-CM | POA: Insufficient documentation

## 2022-11-17 DIAGNOSIS — S0990XA Unspecified injury of head, initial encounter: Secondary | ICD-10-CM

## 2022-11-17 DIAGNOSIS — Z79899 Other long term (current) drug therapy: Secondary | ICD-10-CM | POA: Diagnosis not present

## 2022-11-17 DIAGNOSIS — R509 Fever, unspecified: Secondary | ICD-10-CM | POA: Insufficient documentation

## 2022-11-17 DIAGNOSIS — J45909 Unspecified asthma, uncomplicated: Secondary | ICD-10-CM | POA: Diagnosis not present

## 2022-11-17 DIAGNOSIS — S199XXA Unspecified injury of neck, initial encounter: Secondary | ICD-10-CM | POA: Diagnosis not present

## 2022-11-17 DIAGNOSIS — Z7982 Long term (current) use of aspirin: Secondary | ICD-10-CM | POA: Insufficient documentation

## 2022-11-17 DIAGNOSIS — R062 Wheezing: Secondary | ICD-10-CM | POA: Diagnosis not present

## 2022-11-17 DIAGNOSIS — Z85820 Personal history of malignant melanoma of skin: Secondary | ICD-10-CM | POA: Insufficient documentation

## 2022-11-17 DIAGNOSIS — Z85118 Personal history of other malignant neoplasm of bronchus and lung: Secondary | ICD-10-CM | POA: Diagnosis not present

## 2022-11-17 LAB — URINALYSIS, W/ REFLEX TO CULTURE (INFECTION SUSPECTED)
Bacteria, UA: NONE SEEN
Bilirubin Urine: NEGATIVE
Glucose, UA: NEGATIVE mg/dL
Hgb urine dipstick: NEGATIVE
Ketones, ur: NEGATIVE mg/dL
Leukocytes,Ua: NEGATIVE
Nitrite: NEGATIVE
Protein, ur: NEGATIVE mg/dL
Specific Gravity, Urine: 1.014 (ref 1.005–1.030)
pH: 5 (ref 5.0–8.0)

## 2022-11-17 MED ORDER — ACETAMINOPHEN 500 MG PO TABS
1000.0000 mg | ORAL_TABLET | Freq: Once | ORAL | Status: AC
  Administered 2022-11-17: 1000 mg via ORAL

## 2022-11-17 MED ORDER — ACETAMINOPHEN 500 MG PO TABS
ORAL_TABLET | ORAL | Status: AC
  Filled 2022-11-17: qty 2

## 2022-11-17 NOTE — ED Provider Triage Note (Signed)
Emergency Medicine Provider Triage Evaluation Note  [name] , a 72 y.o. male  was evaluated in triage.  Pt complains of head injury after dropping a barn door on his head approximately 6 hours ago, continues to have a headache, continues to feel foggy.  Also arrives febrile with temperature 101.6, recently finish a full round of doxycycline along with steroids to help with his COPD exacerbation.  Review of Systems  Positive: headache Negative: Cough, wheezing  Physical Exam  There were no vitals taken for this visit. Gen:   Awake, no distress   Resp:  Normal effort  MSK:   Moves extremities without difficulty  Other:  Lungs are diminished.  Medical Decision Making  Medically screening exam initiated at 6:10 PM.  Appropriate orders placed.  [name] was informed that the remainder of the evaluation will be completed by another provider, this initial triage assessment does not replace that evaluation, and the importance of remaining in the ED until their evaluation is complete.     Claude Manges, PA-C 11/17/22 1814

## 2022-11-17 NOTE — ED Provider Notes (Signed)
[place] EMERGENCY DEPARTMENT AT [facility] Provider Note   CSN: [id number] Arrival date & time: 11/17/22  1759     History  Chief Complaint  Patient presents with   Head Injury    [name] is a 72 y.o. male.   Head Injury Patient presents after head injury.  States he was opening a sliding door and it came off and hit him in the head.  No loss consciousness does have some head and neck pain.  No numbness weakness.  No confusion.  Not on blood thinners. Went to urgent care then sent here.  However feels a little foggy.  Upon arrival however found to have a fever 101.6.  Not febrile at urgent care.  Had been on doxycycline for COPD exacerbation.  States he has urinary frequency but cough is pretty much clear itself up.    Past Medical History:  Diagnosis Date   Arthritis    Bronchitis    history of   COPD with asthma 12/01/2021   Dysrhythmia    ":Due to MVP"   GERD (gastroesophageal reflux disease)    Hiatal hernia    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 last stress test 2003   Pneumonia 2009   Primary immune deficiency disorder [facility])    Tumor cells, benign    lung, left side   Unstable angina (HCC) 10/28/2017    Home Medications Prior to Admission medications   Medication Sig Start Date End Date Taking? Authorizing Provider  albuterol (PROVENTIL) (2.5 MG/3ML) 0.083% nebulizer solution Take 3 mLs (2.5 mg total) by nebulization every 6 (six) hours as needed for wheezing or shortness of breath. 11/03/21   Cobb, Ruby Cola, NP  aspirin 81 MG chewable tablet Chew 81 mg by mouth daily.    [provider]  Budeson-Glycopyrrol-Formoterol (BREZTRI  AEROSPHERE) 160-9-4.8 MCG/ACT AERO Inhale 2 puffs into the lungs 2 (two) times daily. Through patient assistance. 08/09/22   Noemi Chapel, NP  celecoxib (CELEBREX) 200 MG capsule 1 capsule with food as needed 03/04/20   [provider]  cetirizine (ZYRTEC) 10 MG chewable tablet Chew by mouth.    [provider]  Docusate Sodium (DSS) 100 MG CAPS Take by mouth.    [provider]  doxycycline (VIBRA-TABS) 100 MG tablet Take 1 tablet (100 mg total) by mouth 2 (two) times daily. 11/08/22   Tomma Lightning, MD  famotidine (PEPCID) 20 MG tablet Take 1 tablet (20 mg total) by mouth at bedtime. 09/09/15   Richarda Overlie, MD  fluticasone (FLONASE) 50 MCG/ACT nasal spray Place into the nose. 06/03/21   [provider]  guaiFENesin (MUCINEX) 600 MG 12 hr tablet Take by mouth.    [provider]  methylphenidate (RITALIN) 10 MG tablet 1 tablet on empty stomach 06/23/21   [provider]  metoprolol succinate (TOPROL-XL) 25 MG 24 hr tablet TAKE 1 TABLET DAILY 10/07/22   Nahser, Deloris Ping, MD  mirtazapine (REMERON) 15 MG tablet Take by mouth. 04/15/19   [provider]  montelukast (SINGULAIR) 10 MG tablet TAKE 1 TABLET BY MOUTH AT BEDTIME 05/14/22  Kalman Shan, MD  Multiple Vitamin (MULTIVITAMIN) tablet Take 1 tablet by mouth daily.    [provider]  omeprazole (PRILOSEC) 40 MG capsule Take 40 mg by mouth every morning. 01/28/21   [provider]  polyethylene glycol (MIRALAX / GLYCOLAX) packet Take 17 g by mouth daily as needed (for constipation.).     [provider]  predniSONE (DELTASONE) 20 MG tablet Take 2 tablets (40 mg total) by mouth daily with breakfast. 11/08/22   Virl Diamond A, MD  rosuvastatin (CRESTOR) 10 MG tablet TAKE 1 TABLET(10 MG) BY MOUTH DAILY 05/03/19   Nahser, Deloris Ping, MD  SYNTHROID 25 MCG tablet Take 25 mcg by mouth daily before breakfast.  02/05/19   [provider]  tamsulosin  (FLOMAX) 0.4 MG CAPS capsule Take 0.4 mg by mouth daily after breakfast.     [provider]      Allergies    Bupropion, Ciprofloxacin-dexamethasone, Gabapentin, Ciprofloxacin hcl, and Clonidine hcl    Review of Systems   Review of Systems  Physical Exam Updated Vital Signs BP 119/79 (BP Location: Right Leg)   Pulse (!) 59   Temp (!) 97.4 F (36.3 C) (Oral)   Resp 18   Wt 82.6 kg   SpO2 98%   BMI 26.11 kg/m  Physical Exam Vitals and nursing note reviewed.  HENT:     Head: Atraumatic.  Eyes:     Pupils: Pupils are equal, round, and reactive to light.  Cardiovascular:     Rate and Rhythm: Regular rhythm.  Pulmonary:     Breath sounds: No wheezing.  Abdominal:     Tenderness: There is no abdominal tenderness.  Neurological:     Mental Status: He is alert.     ED Results / Procedures / Treatments   Labs (all labs ordered are listed, but only abnormal results are displayed) Labs Reviewed  URINALYSIS, W/ REFLEX TO CULTURE (INFECTION SUSPECTED)    EKG None  Radiology CT HEAD WO CONTRAST ( )  Result Date: 11/17/2022 CLINICAL DATA:  Head trauma, minor, normal mental status (Age 58-64y); Neck trauma (Age >= 65y) EXAM: CT HEAD WITHOUT CONTRAST CT CERVICAL SPINE WITHOUT CONTRAST TECHNIQUE: Multidetector CT imaging of the head and cervical spine was performed following the standard protocol without intravenous contrast. Multiplanar CT image reconstructions of the cervical spine were also generated. RADIATION DOSE REDUCTION: This exam was performed according to the departmental dose-optimization program which includes automated exposure control, adjustment of the mA and/or kV according to patient size and/or use of iterative reconstruction technique. COMPARISON:  CT C-spine and head 03/16/2016 FINDINGS: CT HEAD FINDINGS Brain: Cerebral ventricle sizes are concordant with the degree of cerebral volume loss. Patchy and confluent areas of decreased attenuation are noted  throughout the deep and periventricular white matter of the cerebral hemispheres bilaterally, compatible with chronic microvascular ischemic disease. No evidence of large-territorial acute infarction. No parenchymal hemorrhage. No mass lesion. No extra-axial collection. No mass effect or midline shift. No hydrocephalus. Basilar cisterns are patent. Vascular: No hyperdense vessel. Skull: No acute fracture or focal lesion. Sinuses/Orbits: Left maxillary sinus mucosal thickening. Mucosal thickening of the bilateral sphenoid sinuses. Partial left mastoidectomy. Paranasal sinuses and mastoid air cells are clear. Bilateral lens replacement. Otherwise the orbits are unremarkable. Other: None. CT CERVICAL SPINE FINDINGS Alignment: Stable grade 1 anterolisthesis of C3 on C4 and stable mild retrolisthesis of C4 on C5. Stable grade 1 anterolisthesis of C5 on C6. Skull base and vertebrae: Chronic vertebral body height loss of  the C3 level. Multilevel moderate degenerative changes spine most prominent at the C5 through C7 levels. No associated severe osseous neural foraminal or central canal stenosis. No acute fracture. No aggressive appearing focal osseous lesion or focal pathologic process. Soft tissues and spinal canal: No prevertebral fluid or swelling. No visible canal hematoma. Upper chest: Biapical paraseptal emphysematous changes. Biapical pleural/pulmonary scarring. Stable chronic (from 2017) 0.9 x 0.6 cm ground-glass nodularity (12:70) -no further follow-up indicated. Other: None. IMPRESSION: 1. No acute intracranial abnormality. 2. No acute displaced fracture or traumatic listhesis of the cervical spine. Electronically Signed   By: Tish Frederickson M.D.   On: 11/17/2022 19:25   CT Cervical Spine Wo Contrast  Result Date: 11/17/2022 CLINICAL DATA:  Head trauma, minor, normal mental status (Age 39-64y); Neck trauma (Age >= 65y) EXAM: CT HEAD WITHOUT CONTRAST CT CERVICAL SPINE WITHOUT CONTRAST TECHNIQUE: Multidetector  CT imaging of the head and cervical spine was performed following the standard protocol without intravenous contrast. Multiplanar CT image reconstructions of the cervical spine were also generated. RADIATION DOSE REDUCTION: This exam was performed according to the departmental dose-optimization program which includes automated exposure control, adjustment of the mA and/or kV according to patient size and/or use of iterative reconstruction technique. COMPARISON:  CT C-spine and head 03/16/2016 FINDINGS: CT HEAD FINDINGS Brain: Cerebral ventricle sizes are concordant with the degree of cerebral volume loss. Patchy and confluent areas of decreased attenuation are noted throughout the deep and periventricular white matter of the cerebral hemispheres bilaterally, compatible with chronic microvascular ischemic disease. No evidence of large-territorial acute infarction. No parenchymal hemorrhage. No mass lesion. No extra-axial collection. No mass effect or midline shift. No hydrocephalus. Basilar cisterns are patent. Vascular: No hyperdense vessel. Skull: No acute fracture or focal lesion. Sinuses/Orbits: Left maxillary sinus mucosal thickening. Mucosal thickening of the bilateral sphenoid sinuses. Partial left mastoidectomy. Paranasal sinuses and mastoid air cells are clear. Bilateral lens replacement. Otherwise the orbits are unremarkable. Other: None. CT CERVICAL SPINE FINDINGS Alignment: Stable grade 1 anterolisthesis of C3 on C4 and stable mild retrolisthesis of C4 on C5. Stable grade 1 anterolisthesis of C5 on C6. Skull base and vertebrae: Chronic vertebral body height loss of the C3 level. Multilevel moderate degenerative changes spine most prominent at the C5 through C7 levels. No associated severe osseous neural foraminal or central canal stenosis. No acute fracture. No aggressive appearing focal osseous lesion or focal pathologic process. Soft tissues and spinal canal: No prevertebral fluid or swelling. No  visible canal hematoma. Upper chest: Biapical paraseptal emphysematous changes. Biapical pleural/pulmonary scarring. Stable chronic (from 2017) 0.9 x 0.6 cm ground-glass nodularity (12:70) -no further follow-up indicated. Other: None. IMPRESSION: 1. No acute intracranial abnormality. 2. No acute displaced fracture or traumatic listhesis of the cervical spine. Electronically Signed   By: Tish Frederickson M.D.   On: 11/17/2022 19:25   DG Chest 2 View  Result Date: 11/17/2022 CLINICAL DATA:  Wheezing and possible COPD exacerbation. EXAM: CHEST - 2 VIEW COMPARISON:  Nov 08, 2022 FINDINGS: The heart size and mediastinal contours are within normal limits. Both lungs are clear. Multiple surgical clips are seen overlying the right upper quadrant. Chronic left-sided rib fractures are noted. Multilevel degenerative changes are noted throughout the thoracic spine. IMPRESSION: No active cardiopulmonary disease. Electronically Signed   By: Aram Candela M.D.   On: 11/17/2022 19:22    Procedures Procedures    Medications Ordered in ED Medications  acetaminophen (TYLENOL) tablet 1,000 mg (1,000 mg Oral Given 11/17/22 1823)  ED Course/ Medical Decision Making/ A&P                             Medical Decision Making  Patient with mild head injury.  Head CT done reassuring.  However had headache upon arrival.  Vitals reassuring.  No clear cause.  Had had respiratory type symptoms but is cleared up after doxycycline.  History of COPD.  No nausea or vomiting.  No abdominal pain.  Abdomen nontender.  Has had some dysuria.  Urinalysis done reassuring.  No definite infection there.  Will discharge home with for outpatient workup.  Reviewed pulmonary note.        Final Clinical Impression(s) / ED Diagnoses Final diagnoses:  Minor head injury, initial encounter  Fever, unspecified fever cause    Rx / DC Orders ED Discharge Orders     None         Benjiman Core, MD 11/17/22 2237

## 2022-11-17 NOTE — ED Triage Notes (Signed)
Pt arrives with c/o head injury that happened around 11am today. Per pt, a door fell off the hinges and hit him in the head. Pt a&ox4. Pt endorse some "fogginess" when he stands up.

## 2022-11-19 NOTE — Telephone Encounter (Signed)
New mychart message sent by pt:  [name] "[name]"  P [facility] (supporting Adewale Renne Musca, MD)6 hours ago (8:15 AM)    Sorry to say I still have a bad cough. I have coughing spasms that are usually productive (clear with occasional light yellow). I did have another chest Xray on Wednesday when I went to the ER after getting hit in the head by a barn door. The ER Doc ordered it as I was coughing when seen. Thanks, [name]     Dr. Val Eagle, please advise.

## 2022-11-22 ENCOUNTER — Encounter: Payer: Self-pay | Admitting: Pulmonary Disease

## 2022-11-22 ENCOUNTER — Other Ambulatory Visit: Payer: Self-pay | Admitting: Pulmonary Disease

## 2022-11-22 MED ORDER — PREDNISONE 20 MG PO TABS: 20.0000 mg | ORAL_TABLET | Freq: Every day | ORAL | 0 refills | Status: DC

## 2022-11-22 MED ORDER — AZITHROMYCIN 250 MG PO TABS: ORAL_TABLET | ORAL | 0 refills | Status: DC

## 2022-11-22 NOTE — Progress Notes (Signed)
Prescription for azithromycin and prednisone called to pharmacy

## 2022-11-28 ENCOUNTER — Other Ambulatory Visit: Payer: Self-pay | Admitting: Cardiovascular Disease

## 2022-11-30 ENCOUNTER — Encounter: Payer: Self-pay | Admitting: Pulmonary Disease

## 2022-11-30 ENCOUNTER — Encounter: Payer: Self-pay | Admitting: Internal Medicine

## 2022-11-30 ENCOUNTER — Ambulatory Visit (INDEPENDENT_AMBULATORY_CARE_PROVIDER_SITE_OTHER): Payer: Medicare Other | Admitting: Internal Medicine

## 2022-11-30 VITALS — BP 120/68 | HR 73 | Temp 98.3°F | Ht 70.0 in | Wt 181.6 lb

## 2022-11-30 DIAGNOSIS — J439 Emphysema, unspecified: Secondary | ICD-10-CM | POA: Diagnosis not present

## 2022-11-30 DIAGNOSIS — R058 Other specified cough: Secondary | ICD-10-CM | POA: Diagnosis not present

## 2022-11-30 DIAGNOSIS — J441 Chronic obstructive pulmonary disease with (acute) exacerbation: Secondary | ICD-10-CM

## 2022-11-30 DIAGNOSIS — J4489 Other specified chronic obstructive pulmonary disease: Secondary | ICD-10-CM

## 2022-11-30 MED ORDER — AZELASTINE HCL 0.1 % NA SOLN: 1.0000 | Freq: Two times a day (BID) | NASAL | 12 refills | Status: DC

## 2022-11-30 NOTE — Progress Notes (Signed)
ADRI LEGNER    [id number]    05/29/1951  Primary Care Physician:Wolters, Jasmine December, MD Date of Appointment: 11/30/2022 Established Patient Visit  Chief complaint:   Chief Complaint  Patient presents with   Acute Visit    Pt states he is coughing which he feels is all coming from right lung. States when he breathes in, he feels like there is something being blocked. Pt coughs up white phlegm when he does cough anything up and also has been wheezing.     HPI: PINO DUECK is a 72 y.o. man with history of chronic cough, emphysema.  Has seen a number of my collegues - wert, mannam, ramaswamy, olalare, KC and TP.  Quit smoking 37 years ago. 1.5 x 24 years.    Interval Updates: Here for acute visit. He feels his cough has not improved despite doxycycline and prednisone use. He says that he coughs 2400 times in a day.  He has a spasmodic sudden onset quality of his cough.  He has frequent throat clearing, he has uncontrolled reflux despite extensive lifestyle modification and medication.  He has chronic rhinitis and does take Flonase daily with an antihistamine.  His cough is worse with deep breaths, talking.  He is having shortness of breath with exertion.  Markus Daft has helped him a lot.   His oxygen level is always doing well at home.  Brother was diagnosed with lung cancer 35 years after quitting smoking.  He is tearful and recalling this.  He passed away last year.  It appears this is a very rapidly progressing adenocarcinoma.  He has chronic rhinitis - he takes flonase every morning. And takes mucinex.   I have reviewed the patient's family social and past medical history and updated as appropriate.   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 last stress test 2003   Pneumonia 2009   Primary immune deficiency disorder (HCC)    Tumor cells, benign    lung, left side   Unstable angina (HCC) 10/28/2017    Past Surgical History:  Procedure Laterality Date   APPENDECTOMY  1984   CATARACT EXTRACTION     L and R eye   CHOLECYSTECTOMY  2002   HYDROCELE EXCISION Right 11/04/2016   Procedure: HYDROCELECTOMY ADULT;  Surgeon: Marcine Matar, MD;  Location: [facility];  Service: Urology;  Laterality: Right;   INGUINAL HERNIA REPAIR Bilateral 11/04/2016   Procedure: HERNIA REPAIR INGUINAL ADULT BILATERAL;  Surgeon: Avel Peace, MD;  Location: [facility];  Service: General;  Laterality: Bilateral;   INSERTION OF MESH Bilateral 11/04/2016   Procedure: INSERTION OF MESH;  Surgeon: Avel Peace, MD;  Location: [facility];  Service: General;  Laterality: Bilateral;   JOINT REPLACEMENT     Lt hip   LEFT HEART CATH AND CORONARY ANGIOGRAPHY N/A 10/31/2017  Procedure: LEFT HEART CATH AND CORONARY ANGIOGRAPHY;  Surgeon: Lennette Bihari, MD;  Location: [facility] INVASIVE CV LAB;  Service: Cardiovascular;  Laterality: N/A;   MASTOIDECTOMY  1996   NASAL SEPTUM SURGERY     sinus surgery   PARATHYROIDECTOMY     TOTAL HIP REVISION  06/14/2011   Procedure: TOTAL HIP REVISION;  Surgeon: Nestor Lewandowsky;  Location: [facility];  Service: Orthopedics;  Laterality: Left;  Left Acetabular  Hip Revision    Family History  Problem Relation Age of Onset   Heart Problems Mother    Lung cancer Father 46       smoked   Heart Problems Father    Neuropathy Sister    Cancer Brother        prostate   Lupus Daughter    Healthy Daughter    Multiple sclerosis Son     Social History   Occupational  History    Comment: Sales   Occupation: Retired  Tobacco Use   Smoking status: Former    Packs/day: 1.50    Years: 24.00    Additional pack years: 0.00    Total pack years: 36.00    Types: Cigarettes    Start date: 1966    Quit date: 07/05/1985    Years since quitting: 37.4    Passive exposure: Never   Smokeless tobacco: Never  Vaping Use   Vaping Use: Never used  Substance and Sexual Activity   Alcohol use: Yes    Comment: occ   Drug use: No   Sexual activity: Not on file     Physical Exam: Blood pressure 120/68, pulse 73, temperature 98.3 F (36.8 C), temperature source Oral, height 5\' 10"  (1.778 m), weight 181 lb 9.6 oz (82.4 kg), SpO2 99 %.  Gen:      No acute distress, frequent coughing especially with inspiration and talking ENT:   +cobblestoning, no nasal polyps, mucus membranes moist Lungs:    No increased respiratory effort, symmetric chest wall excursion, clear to auscultation bilaterally, no wheezes or crackles CV:         Regular rate and rhythm; no murmurs, rubs, or gallops.  No pedal edema   Data Reviewed: Imaging: I have personally reviewed the chest x-ray from May 2024 which shows no acute cardiopulmonary process  PFTs:     Latest Ref Rng & Units 07/31/2021   11:52 AM 08/04/2020    8:53 AM 03/11/2020   10:06 AM 09/25/2019    4:01 PM  PFT Results  FVC-Pre L 2.99  2.95  P 3.17  3.15   FVC-Predicted Pre % 63  61  P 65  65   FVC-Post L 3.10      FVC-Predicted Post % 65      Pre FEV1/FVC % % 84  84  P 83  83   Post FEV1/FCV % % 87      FEV1-Pre L 2.52  2.48  P 2.64  2.63   FEV1-Predicted Pre % 72  70  P 74  73   FEV1-Post L 2.69      DLCO uncorrected ml/min/mmHg 22.06   24.75  21.79   DLCO UNC% % 80   89  78   DLCO corrected ml/min/mmHg 22.06   24.75  21.32   DLCO COR %Predicted % 80   89  76   DLVA Predicted % 126   121  112   TLC L 4.84      TLC % Predicted % 65  RV % Predicted % 68        P Preliminary result   I have personally reviewed  the patient's PFTs and they show no airflow limitation. Mild restriction to ventilation. Normal DLCO.   Labs: Lab Results  Component Value Date   WBC 11.3 (H) 11/17/2021   HGB 15.1 11/17/2021   HCT 44.8 11/17/2021   MCV 95.7 11/17/2021   PLT 161.0 11/17/2021   Lab Results  Component Value Date   NA 139 11/17/2021   K 4.3 11/17/2021   CL 103 11/17/2021   CO2 28 11/17/2021    Immunization status: Immunization History  Administered Date(s) Administered   H1N1 04/25/2008   Influenza Split 04/25/2008, 04/08/2009, 04/03/2010, 04/02/2011, 04/18/2012, 04/27/2013, 04/05/2015, 03/30/2019   Influenza, High Dose Seasonal PF 03/30/2016   Influenza,inj,Quad PF,6+ Mos 04/02/2016, 03/03/2017, 04/28/2018, 04/04/2019, 03/20/2020   Influenza,inj,quad, With Preservative 04/15/2015   Influenza-Unspecified 03/26/2021   PFIZER(Purple Top)SARS-COV-2 Vaccination 08/12/2019, 09/05/2019, 04/15/2020, 10/13/2020   Pfizer Covid-19 Vaccine Bivalent Booster 55yrs & up 03/17/2021   Pneumococcal Conjugate-13 05/08/2015   Pneumococcal Polysaccharide-23 10/24/2007, 03/03/2017   Td 03/26/2004   Tdap 03/13/2013   Zoster, Live 03/13/2013, 05/25/2021    External Records Personally Reviewed: Pulmonary  Assessment:  Chronic cough, likely multifactorial from chronic rhinitis and reflux COPD, Gold stage 0, with radiographic emphysema History of tobacco use disorder Family history of lung cancer  Plan/Recommendations: Mr. Snay has 2 very distinct qualities of cough.  He has a spasmodic cough most likely related to his upper airway cough syndrome.  His COPD symptoms are actually relatively mild.  He is doing well on Breztri.  Will continue this with as needed albuterol.  The uncontrolled component of his cough is likely related to the chronic rhinitis.  I will switch him from Zyrtec to a different antihistamine.  Will also add Astelin nasal spray to his Flonase.  We did review technique today.  I recommend not  taking any medications with a D in them, as the decongestant is likely pseudoephedrine which is causing some rebound congestion.  He has significant irritable larynx syndrome and I have given him some lifestyle tips and tricks to help with this.  A trial of speech therapy may be useful in the future if the symptoms do not resolve.  I have ordered a CT chest for his family history of lung cancer as well as his personal history of tobacco use disorder, as well as the COPD with chronic coughing.   Return to Care: Return in about 6 months (around 06/02/2023).   Durel Salts, MD Pulmonary and Critical Care Medicine [facility] Office:[phone]

## 2022-11-30 NOTE — Patient Instructions (Addendum)
Please schedule follow up scheduled with Dr Marchelle Gearing in 6 months.  If my schedule is not open yet, we will contact you with a reminder closer to that time. Please call [phone] if you haven't heard from [place] a month before.   Your COPD is mild. Continue Breztri. Use albuterol inhaler for shortness of breath in between during the day.  You have chronic cough related to poorly controlled post nasal drainage and GERD.  Keep taking flonase. I am adding another spray called astelin. Take this in morning, flonase at night.  Stop cetirizine, switch to xyzal or levocetirizine. Continue singulair.  Before your next visit I would like you to have: CT Chest - will order this and call you.   Flonase/astelin - 1 spray on each side of your nose twice a day for first week, then 1 spray on each side.   Instructions for use: If you also use a saline nasal spray or rinse, use that first. Position the head with the chin slightly tucked. Use the right hand to spray into the left nostril and the right hand to spray into the left nostril.   Point the bottle away from the septum of your nose (cartilage that divides the two sides of your nose).  Hold the nostril closed on the opposite side from where you will spray Spray once and gently sniff to pull the medicine into the higher parts of your nose.  Don't sniff too hard as the medicine will drain down the back of your throat instead. Repeat with a second spray on the same side if prescribed. Repeat on the other side of your nose.    Respiratory Retraining and Laryngeal Control Exercises These techniques focus on keeping the airway and vocal folds open while maintaining a steady stream of air. You're more likely to have a laryngospasm, coughing or throat clearing episode when your vocal folds are closed (when you hold your breath or talk.)  Respiratory Retraining Therapy: Respiratory retraining is a series of techniques for treating persons with Paradoxical  Vocal Fold Motion Disorder (PVFMD). PVFMD is characterized by involuntary closing of the vocal folds while attempting to inhale, and sometimes when exhaling. The airway closure prevents air from getting into the lungs, causing breathing difficulty, sometimes associated with wheezing and/or stridor (gasping sound). Other common symptoms associated with PVFMD are chronic cough, frequent throat clearing, globus or foreign body sensation, hoarseness or change in voice quality, and choking. These symptoms can occur constantly of as acute episodes.  The choking sensation, sometimes described as PVFMD episodes or "attacks," can happen at any time. When severe, these episodes become quite panicking, causing repeated visits to ER or even hospitalizations. Often, the condition is misdiagnosed or confused with asthma, especially because it may co-occur in people with asthma. PVFMD is called the "great masquerader" frequently resulting in unnecessary medical intervention. The intervention found most effective in treating this disorder is speech therapy focusing on respiratory retraining.  An essential aspect of respiratory retraining is reassuring the person that nothing is physically wrong (no present disease or organic/physical obstruction of the airway). Consequently, encouraging the person to learn how to control the breathing will prevent and minimize the severity of possible PVFMD "attacks." It is also necessary to clarify the underlying causes, aggravating circumstances or agents ("triggers"), and predisposing conditions related to PVFMD, such as acid reflux, airborne irritant inductions, emotional stress, and muscular tension.  The keys to therapy for PVFMD include muscle relaxation; retraining of the breath cycle to  increase the individual's awareness of relaxed rhythmic cycles, and reduce the effort associated with respiration; and treatment of acid reflux.  The most important aspect of this treatment relies  on the fact that the person with PVFMD is actually able to control his or her vocal muscles through regular, rhythmic and relaxed exhalation and passive inhalation. Great emphasis is placed on the abdominal breathing pattern and restoring diaphragm function, which will reduce excessive tension of the muscles in the chest, shoulder elevation and neck contraction.    Respiratory retraining strategies  Goal: Prevent or resolve episodes of shortness of breath due to suspected paradoxical vocal fold motion (PVFM)/vocal cord dysfunction (VCD). These strategies can also help resolve episodes of chronic cough.  Strategies: If you feel like you are about to cough, or are in the middle of a breathing episode, begin using one of the strategies outlined below. Continue the breathing strategy 30 seconds up to several minutes until the symptoms have passed.  1. Smell the Flower/Blow out the Candle: Breathe in slowly through your nose while keeping your lips together and your jaw relaxed (don't clench teeth). Steadily blow air out through pursed lips.  2. Gradual Inhalation: Take in three short sniffs through the nose and then exhale through pursed lips or exhale with a "shhh" sound.  3. Straw-shaped Respiration: Pretend that you are breathing in and out of a straw (inhale and exhale through pursed lips). You may also use an actual drinking straw with this strategy.  4. Sharp Inhale on "F" : Use your top teeth to bite your bottom lip like you're going to make the "f" sound. While making the "f" sound, quickly suck in air, like you're gasping. It should sound like you're saying "fuh" while audibly inhaling. This provides a quick burst of air into your airway. After you inhale, slowly exhale on either pursed lips or the open-mouth shape (whichever you prefer).   Practice these when you're not having a coughing episode or laryngospasms so when you do have an episode, it is more automatic to apply them. You might  have to continue to do these techniques multiple times (30 seconds to a couple minutes) before they begin to work. Focus on the fact that you are getting adequate air during these episodes and try to feel that sensation of the air moving in and out while using these techniques.    Try to identify triggers of episodes so you can learn to avoid them or use these techniques to work through them if they are impractical to avoid.   Breathing rate/rhythm:  Avoid holding your breath - air should always be moving in or out  Inhalation should occur over a 1-2 second count  Exhalation should occur over a 3-6 second count  Don't take in a huge breath--just breathe in as much air as you need   Homework:  Practice this continuous breathing for 1-2 minutes, 4-5 times a day until you can do them from memory  The strategies have to be completed during episodes to begin decreasing the frequency and length of time the episodes last  Quick review of low abdominal breathing:  The belly moves OUT as you inhale (breathe in)  The belly moves IN as you exhale/make a sound (breathe out)  The shoulders stay low, relaxed, and still     Strategies to reduce coughing and clearing   Sip liquid or perform a hard swallow instead of coughing or clearing your throat  Will help decrease mucus production  and, over time, reduce the urge to cough or clear   Can gently "tap" vocal folds together to remove mucus or exhale on a short "hhh," followed by a swallow   Increase water consumption (think "pee pale") and use caffeine in moderation  Caffeinated drinks dry you out, make you more thirsty, and thicken saliva  Problem with nasal drainage? If on nasal sprays to thin or reduce post-nasal drip or improve nasal congestion:  Aim the tip of the nasal spray towards the outer corner of each eye so the medication goes where it's needed (into the sinuses), sniffing in gently as you spray with your head slightly  tilted down  Don't give up too soon--some over-the-counter nasal sprays have to be used continuously (~12-14 days) before they start working and you notice a change (Flonase is one such example)   Try an over the counter saline sinus wash (not just the spray). NeilMed Saline Sinus wash is one brand    Dry mouth? Can try cough drops, candy, &/or gum to increase production of saliva:   Only use ones that are sugar-free--ones with sugar make your saliva thicker and mouth feel more dry  Try honey/lemon or other flavors o Avoid ones with menthol--it can dry you out    Ask your pharmacist if any of your medications can cause dry mouth as a side effect  If so, ask the referring physician if there are alternative medications that might be tried   To keep your voice clear  (especially if talking makes you want to clear or cough)  Breath Support with Speaking: Breathe in and then immediately use the air to speak. Keep the air moving through the last word of the sentence so your voice doesn't become "gravelly." Take in only as much air as you need to say the sentence. You don't need to take in a big breath--just enough for the sentence. If you aren't breathing in often enough, or are speaking too long on one breath, talking can feel uncomfortable or effortful. In general, you should be taking in a breath during any pause in conversation (wherever punctuation points would be) or as needed as to not drop into glottal fry.  Respiratory Retraining and Laryngeal Control Exercises Try these breathing techniques if you feel you're about to cough, to try to help work through the urge. You may need to continue the breathing cycle for ~15-30 seconds.  Pursed Lip Breathing Take a short inhalation in through your nose (mouth if you find it more helpful) between 1-2 counts long. Immediately exhale through pursed lips like you're gently flickering the flame of a candle for about 5-6 counts. Repeat this with only a  short break of about 1 count between each cycle. If you find you are getting lightheaded, shorten your inhalation and lengthen your exhalation.   Straw Breathing Breathe in and out through pursed lips, as if through a straw, or use a straw (opening has to be large enough to easily get enough air in and out.   Irritable Larynx Syndrome (ILS) Irritable Larynx Syndrome (ILS) refers to a group of disorders that include chronic throat clearing & chronic coughing. It involves laryngeal hypersensitivity, which is when the throat overreacts to protect the airway from a common or harmless irritant. Because this irritant has caused such a repetitive, behavioral action of coughing, throat clearing, or a laryngospasm, this has become a natural reaction in response to something that the body has deemed dangerous. Common irritants include: environmental irritants (  e.g., strong odors, dust, smoke or chemical exposure), physiological irritants (refluxed material, mucous) and triggers such as weather changes, physical exertion, stress, or voice use.  Over-the-counter products for dry mouth:  Biotene Products 1. Oral Rinse 2. Lozenges 3. Oral Gel 4. Moisturizing Spray  Where to buy:  Pharmacies such as: [facility], [facility], [facility]  Stores such as: [employer] [facility] [employer]  Online: [employer] 1. Stick-On Melts ? Stick to gum and inside of cheek discreetly during the day to increase saliva production; stick two on at night to prevent dry mouth while sleeping and when you wake up.   Where to Buy:  Pharmacies such as: [facility], [facility], [facility]  Stores such as: [employer] [facility] [employer]  Online: [employer]

## 2022-12-01 ENCOUNTER — Other Ambulatory Visit: Payer: Self-pay

## 2022-12-01 MED ORDER — ALBUTEROL SULFATE HFA 108 (90 BASE) MCG/ACT IN AERS: 2.0000 | INHALATION_SPRAY | Freq: Four times a day (QID) | RESPIRATORY_TRACT | 3 refills | Status: AC | PRN

## 2022-12-07 DIAGNOSIS — T162XXA Foreign body in left ear, initial encounter: Secondary | ICD-10-CM | POA: Diagnosis not present

## 2022-12-07 DIAGNOSIS — H906 Mixed conductive and sensorineural hearing loss, bilateral: Secondary | ICD-10-CM | POA: Diagnosis not present

## 2022-12-07 DIAGNOSIS — H6121 Impacted cerumen, right ear: Secondary | ICD-10-CM | POA: Diagnosis not present

## 2022-12-07 DIAGNOSIS — H7293 Unspecified perforation of tympanic membrane, bilateral: Secondary | ICD-10-CM | POA: Diagnosis not present

## 2022-12-14 ENCOUNTER — Ambulatory Visit (HOSPITAL_COMMUNITY): Payer: Medicare Other | Attending: Internal Medicine

## 2022-12-14 DIAGNOSIS — L280 Lichen simplex chronicus: Secondary | ICD-10-CM | POA: Diagnosis not present

## 2022-12-14 DIAGNOSIS — D485 Neoplasm of uncertain behavior of skin: Secondary | ICD-10-CM | POA: Diagnosis not present

## 2022-12-14 DIAGNOSIS — L218 Other seborrheic dermatitis: Secondary | ICD-10-CM | POA: Diagnosis not present

## 2022-12-27 ENCOUNTER — Other Ambulatory Visit: Payer: Self-pay | Admitting: Cardiovascular Disease

## 2022-12-27 DIAGNOSIS — H43813 Vitreous degeneration, bilateral: Secondary | ICD-10-CM | POA: Diagnosis not present

## 2022-12-27 DIAGNOSIS — H0102A Squamous blepharitis right eye, upper and lower eyelids: Secondary | ICD-10-CM | POA: Diagnosis not present

## 2022-12-27 DIAGNOSIS — Z961 Presence of intraocular lens: Secondary | ICD-10-CM | POA: Diagnosis not present

## 2022-12-27 DIAGNOSIS — H0288B Meibomian gland dysfunction left eye, upper and lower eyelids: Secondary | ICD-10-CM | POA: Diagnosis not present

## 2022-12-27 DIAGNOSIS — H0102B Squamous blepharitis left eye, upper and lower eyelids: Secondary | ICD-10-CM | POA: Diagnosis not present

## 2022-12-27 DIAGNOSIS — H0288A Meibomian gland dysfunction right eye, upper and lower eyelids: Secondary | ICD-10-CM | POA: Diagnosis not present

## 2023-01-03 DIAGNOSIS — L570 Actinic keratosis: Secondary | ICD-10-CM | POA: Diagnosis not present

## 2023-01-18 ENCOUNTER — Ambulatory Visit (INDEPENDENT_AMBULATORY_CARE_PROVIDER_SITE_OTHER): Payer: Medicare Other | Admitting: Podiatry

## 2023-01-18 VITALS — BP 113/61

## 2023-01-18 DIAGNOSIS — B351 Tinea unguium: Secondary | ICD-10-CM | POA: Diagnosis not present

## 2023-01-18 DIAGNOSIS — M79675 Pain in left toe(s): Secondary | ICD-10-CM | POA: Diagnosis not present

## 2023-01-18 DIAGNOSIS — M79674 Pain in right toe(s): Secondary | ICD-10-CM | POA: Diagnosis not present

## 2023-01-23 ENCOUNTER — Encounter: Payer: Self-pay | Admitting: Podiatry

## 2023-01-23 NOTE — Progress Notes (Signed)
  Subjective:  Patient ID: [name], male    DOB: [birth year],  MRN: [id number]  BARTOLO MONTANYE presents to clinic today for: painful thick toenails that are difficult to trim. Pain interferes with ambulation. Aggravating factors include wearing enclosed shoe gear. Pain is relieved with periodic professional debridement.  Chief Complaint  Patient presents with   Nail Problem    rfc    PCP is [name], MD.  Allergies  Allergen Reactions   Bupropion Other (See Comments)    Caused altered mental status Altered mental status Caused altered mental status   Ciprofloxacin-Dexamethasone Other (See Comments)    Burning in ears when Cipro ear drops were used unknown Burning in ears when Cipro ear drops were used   Gabapentin Other (See Comments)    Caused weakness, dizziness, and forgetfulness  Weakness and dizziness with higher doses. Weakness and dizziness Caused weakness, dizziness, and forgetfulness   Ciprofloxacin Hcl Other (See Comments)    Burning in ears when drops were used   Clonidine Hcl Other (See Comments)    Review of Systems: Negative except as noted in the HPI.  Objective: No changes noted in today's physical examination. Vitals:   01/18/23 0852  BP: 113/61    JAS BETTEN is a pleasant 72 y.o. male in NAD. AAO x 3.  Vascular Examination: Capillary refill time <3 seconds b/l LE. Palpable pedal pulses b/l LE. Digital hair present b/l. No pedal edema b/l. Skin temperature gradient WNL b/l. No varicosities b/l. No cyanosis or clubbing noted b/l LE.Marland Kitchen  Dermatological Examination: Pedal skin with normal turgor, texture and tone b/l. No open wounds. No interdigital macerations b/l. Toenails 1-5 b/l thickened, discolored, dystrophic with subungual debris. There is pain on palpation to dorsal aspect of nailplates. No corns, calluses nor porokeratotic lesions noted..  Neurological Examination: Protective sensation intact with 10 gram monofilament b/l LE.  Vibratory sensation intact b/l LE.   Musculoskeletal Examination: Normal muscle strength 5/5 to all lower extremity muscle groups bilaterally. No pain, crepitus or joint limitation noted with ROM b/l LE. No gross bony pedal deformities b/l. Patient ambulates independently without assistive aids. No pain, crepitus or joint limitation noted with ROM bilateral LE. No gross bony deformities bilaterally. Patient ambulates independent of any assistive aids.  Assessment/Plan: 1. Pain due to onychomycosis of toenails of both feet     -Consent given for treatment as described below: -Examined patient. -Toenails 1-5 b/l were debrided in length and girth with sterile nail nippers and dremel without iatrogenic bleeding.  -Patient/POA to call should there be question/concern in the interim.   Return in about 9 weeks (around 03/22/2023).  Freddie Breech, DPM

## 2023-03-02 ENCOUNTER — Telehealth: Payer: Self-pay | Admitting: Nurse Practitioner

## 2023-03-02 NOTE — Telephone Encounter (Signed)
[name] has been requesting his Breztri refill but wife says we have not called it in. Please call wife to advise @ [phone]

## 2023-03-04 MED ORDER — BREZTRI AEROSPHERE 160-9-4.8 MCG/ACT IN AERO: 2.0000 | INHALATION_SPRAY | Freq: Two times a day (BID) | RESPIRATORY_TRACT | 3 refills | Status: DC

## 2023-03-04 NOTE — Telephone Encounter (Signed)
Left message for call back.  Breztri sent to pharmacy.  Will patch in prior auth team as well.

## 2023-03-05 DIAGNOSIS — Z23 Encounter for immunization: Secondary | ICD-10-CM | POA: Diagnosis not present

## 2023-03-08 ENCOUNTER — Other Ambulatory Visit (HOSPITAL_COMMUNITY): Payer: Self-pay

## 2023-03-08 NOTE — Telephone Encounter (Signed)
PA is not needed. Patient co-pay is $274.00 due to $200 applied to deductible.

## 2023-03-09 ENCOUNTER — Telehealth: Payer: Self-pay | Admitting: Cardiovascular Disease

## 2023-03-09 ENCOUNTER — Other Ambulatory Visit: Payer: Self-pay

## 2023-03-09 MED ORDER — NITROGLYCERIN 0.4 MG SL SUBL: 0.4000 mg | SUBLINGUAL_TABLET | SUBLINGUAL | 3 refills | Status: DC | PRN

## 2023-03-09 MED ORDER — BREZTRI AEROSPHERE 160-9-4.8 MCG/ACT IN AERO: 2.0000 | INHALATION_SPRAY | Freq: Two times a day (BID) | RESPIRATORY_TRACT | 5 refills | Status: DC

## 2023-03-09 NOTE — Telephone Encounter (Signed)
Received call transferred directly from operator and spoke with patient and his wife. Patient reports he had episode of chest pain while driving today.  Lasted about 45 minutes. He is not having any pain at this time.  Pain is to the left of center in his chest. No radiation.  Had similar episode yesterday and Monday.  These episodes lasted a few minutes and occurred while he was sitting and watching TV. Does not do much physical exertion. Reports he does have GERD and esophageal issues.  Pain is similar to what he had in the past when he saw Dr Elease Hashimoto.  Patient previously had NTG but it expired.  Will send refill to [facility] on [address]   Appointment made for patient to see Robin Searing, NP on September 6 at 3:10.  Patient advised to go to ED if symptoms return

## 2023-03-09 NOTE — Telephone Encounter (Signed)
   Pt c/o of Chest Pain: STAT if active CP, including tightness, pressure, jaw pain, radiating pain to shoulder/upper arm/back, CP unrelieved by Nitro. Symptoms reported of SOB, nausea, vomiting, sweating.  1. Are you having CP right now? About 10 min    2. Are you experiencing any other symptoms (ex. SOB, nausea, vomiting, sweating)? No    3. Is your CP continuous or coming and going? continuous   4. Have you taken Nitroglycerin? Out of nitro   5. How long have you been experiencing CP? A couple days    6. If NO CP at time of call then end call with telling Pt to call back or call 911 if Chest pain returns prior to return call from triage team.

## 2023-03-10 NOTE — Progress Notes (Signed)
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 last stress test 2003   Pneumonia 2009   Primary immune deficiency disorder (HCC)    Tumor cells, benign    lung, left side   Unstable angina (HCC) 10/28/2017    History of Present Illness  [name] is a 72 y.o. male with a PMH of CAD s/p mild nonobstructive disease s/p LHC 2019, GERD/Barrett esophagus hiatal hernia, interstitial lung disease who presents today with complaint of chest pain.  Mr. Chute was seen initially in 2016 with complaint of chest pain.  He was sent for Myoview that was normal.  Chest pain at that time was felt to be noncardiac related.  He was seen in 2017 by Dr. Katrinka Blazing for complaint of syncope was thought to be related to neuro and not cardiac.  He presented to the ED on 10/2017 with complaint of chest pain and was previously seen by Dr. Elease Hashimoto with plan for coronary CTA.  EKG showed  TWI on EKG troponins were negative.  Left heart cath was completed showing mild CAD with scattered 20% narrowing of proximal LAD proximal RCA, distal RCA with plan for medical therapy.  Carotid ultrasound completed 01/10/2019 that showed bilateral ICA (1-39%), 2D echo was completed 11/09/2019 with EF of 65 to 70% with no  RWMA and mild LVH with normal RV systolic function trivial MVR.  He was last seen by Dr. Elease Hashimoto on 07/31/2021 and reported some left side chest pain and dizziness with standing.  No further cardiac workup was completed at that time and pain was found to be noncardiac in nature.  During today's visit the patient reports that he has been experiencing chest discomfort which he describes as a sharp central pain that last for few moments and resolved spontaneously.  He is currently chest pain-free today during his exam and blood pressures were stable at 116/66 and heart rate 75 bpm.  He is compliant with his current medications and denies any adverse reactions.  During today's visit we discussed the pathophysiology of angina and reviewed available medications for treatment..  Patient denies palpitations, dyspnea, PND, orthopnea, nausea, vomiting, dizziness, syncope, edema, weight gain, or early satiety.   Review of Systems  Please see the history of present illness.    All other systems reviewed and are otherwise negative except as noted above.  Physical Exam    Wt Readings from Last 3 Encounters:  11/30/22 181 lb 9.6 oz (82.4 kg)  11/17/22 182 lb (82.6 kg)  11/08/22 182 lb 9.6 oz (82.8 kg)   ZY:SAYTK were no vitals filed for this visit.,There is no height or weight on  file to calculate BMI. GEN: Well nourished, well developed in no acute distress Neck: No JVD; No carotid bruits Pulmonary: Clear to auscultation without rales, wheezing or rhonchi  Cardiovascular: Normal rate. Regular rhythm. Normal S1. Normal S2.   Murmurs: There is no murmur.  ABDOMEN: Soft, non-tender, non-distended EXTREMITIES:  No edema; No deformity   EKG/LABS/ Recent Cardiac Studies   ECG personally reviewed by me today -sinus rhythm with right axis deviation and rate of 75 bpm with no acute changes consistent with previous EKG.  Risk Assessment/Calculations:       Lab Results  Component Value Date   WBC 11.3 (H)  11/17/2021   HGB 15.1 11/17/2021   HCT 44.8 11/17/2021   MCV 95.7 11/17/2021   PLT 161.0 11/17/2021   Lab Results  Component Value Date   CREATININE 1.15 11/17/2021   BUN 22 11/17/2021   NA 139 11/17/2021   K 4.3 11/17/2021   CL 103 11/17/2021   CO2 28 11/17/2021   Lab Results  Component Value Date   CHOL 134 02/13/2018   HDL 41 02/13/2018   LDLCALC 66 02/13/2018   TRIG 137 02/13/2018   CHOLHDL 3.3 02/13/2018    Lab Results  Component Value Date   HGBA1C 5.4 10/28/2017   Assessment & Plan    1.  Chest pain: -Patient reports ongoing chest pain that is sharp in nature and located centrally with onset occurring with or without activity -We will try Imdur 30 mg daily -ED precautions discussed and patient aware to seek help if chest pain not resolved with medication.  2.Nonobstructive CAD: -s/p Left heart cath was completed showing mild CAD with scattered 20% narrowing of proximal LAD proximal RCA, distal RCA treated medically -Continue current GDMT with ASA 81 mg, metoprolol 25 mg daily, as needed Nitrostat 0.4 mg, Crestor 10 mg  3.  History of interstitial lung disease: -Currently followed by pulmonology  4.  Tachycardia: -Patient currently on Toprol-XL with no recurrence of tachycardia since previous visit  Disposition: Follow-up with Kristeen Miss, MD or APP as needed    Signed, Napoleon Form, Leodis Rains, NP 03/10/2023, 1:59 PM [facility]

## 2023-03-10 NOTE — Telephone Encounter (Signed)
What are the alternative options?

## 2023-03-11 ENCOUNTER — Ambulatory Visit: Payer: Medicare Other | Attending: Nurse Practitioner | Admitting: Nurse Practitioner

## 2023-03-11 ENCOUNTER — Encounter: Payer: Self-pay | Admitting: Nurse Practitioner

## 2023-03-11 ENCOUNTER — Other Ambulatory Visit (HOSPITAL_COMMUNITY): Payer: Self-pay

## 2023-03-11 VITALS — BP 116/66 | HR 75 | Ht 70.0 in | Wt 182.8 lb

## 2023-03-11 DIAGNOSIS — I2510 Atherosclerotic heart disease of native coronary artery without angina pectoris: Secondary | ICD-10-CM | POA: Insufficient documentation

## 2023-03-11 DIAGNOSIS — Z8709 Personal history of other diseases of the respiratory system: Secondary | ICD-10-CM | POA: Insufficient documentation

## 2023-03-11 DIAGNOSIS — R000 Tachycardia, unspecified: Secondary | ICD-10-CM | POA: Insufficient documentation

## 2023-03-11 MED ORDER — ISOSORBIDE MONONITRATE ER 30 MG PO TB24: 30.0000 mg | ORAL_TABLET | Freq: Every day | ORAL | 3 refills | Status: DC

## 2023-03-11 NOTE — Patient Instructions (Addendum)
Medication Instructions:  START Imdur 30mg  Take 1 tablet once a day  *If you need a refill on your cardiac medications before your next appointment, please call your pharmacy*   Lab Work: None ordered If you have labs (blood work) drawn today and your tests are completely normal, you will receive your results only by: MyChart Message (if you have MyChart) OR A paper copy in the mail If you have any lab test that is abnormal or we need to change your treatment, we will call you to review the results.   Testing/Procedures: None ordered   Follow-Up: At [facility], you and your health needs are our priority.  As part of our continuing mission to provide you with exceptional heart care, we have created designated Provider Care Teams.  These Care Teams include your primary Cardiologist (physician) and Advanced Practice Providers (APPs -  Physician Assistants and Nurse Practitioners) who all work together to provide you with the care you need, when you need it.  We recommend signing up for the patient portal called "MyChart".  Sign up information is provided on this After Visit Summary.  MyChart is used to connect with patients for Virtual Visits (Telemedicine).  Patients are able to view lab/test results, encounter notes, upcoming appointments, etc.  Non-urgent messages can be sent to your provider as well.   To learn more about what you can do with MyChart, go to [email or web address].    Your next appointment:   AS   NEEDED  Provider:   Kristeen Miss, MD     Other Instructions Please get some ted hose or compression stockings. They can be purchased at your local medical supply store, [employer], [employer] or [employer] fund[employer].  Put them on first thing in the morning and wear them during the day . Elevate your feet during the day and remove hose in the evening before bed.

## 2023-03-11 NOTE — Telephone Encounter (Signed)
Alternative triple therapy of Trelegy is also $247.00. Patient will have to meet their deductible for prices to go down.

## 2023-03-14 NOTE — Telephone Encounter (Signed)
Either treley or breztri is fine. Please explain to him that he has a pharmacy deductible. Otherwise, can see if he qualifies for patient assistance.

## 2023-03-16 ENCOUNTER — Encounter (HOSPITAL_BASED_OUTPATIENT_CLINIC_OR_DEPARTMENT_OTHER): Payer: Self-pay

## 2023-03-16 NOTE — Telephone Encounter (Signed)
Message sent to patient explaining cost and alternative. He is to let us know what he wants [place] to do.

## 2023-03-20 ENCOUNTER — Other Ambulatory Visit: Payer: Self-pay | Admitting: Cardiovascular Disease

## 2023-03-21 ENCOUNTER — Encounter: Payer: Self-pay | Admitting: *Deleted

## 2023-03-21 MED ORDER — BREZTRI AEROSPHERE 160-9-4.8 MCG/ACT IN AERO: 2.0000 | INHALATION_SPRAY | Freq: Two times a day (BID) | RESPIRATORY_TRACT | 3 refills | Status: AC

## 2023-03-25 NOTE — Telephone Encounter (Signed)
Request generated 3 weeks ago. Please call PT to advise.

## 2023-03-30 ENCOUNTER — Ambulatory Visit (INDEPENDENT_AMBULATORY_CARE_PROVIDER_SITE_OTHER): Payer: Medicare Other | Admitting: Podiatry

## 2023-03-30 ENCOUNTER — Ambulatory Visit: Payer: Medicare Other | Admitting: Podiatry

## 2023-03-30 ENCOUNTER — Encounter: Payer: Self-pay | Admitting: Podiatry

## 2023-03-30 DIAGNOSIS — M79675 Pain in left toe(s): Secondary | ICD-10-CM | POA: Diagnosis not present

## 2023-03-30 DIAGNOSIS — M79674 Pain in right toe(s): Secondary | ICD-10-CM

## 2023-03-30 DIAGNOSIS — B351 Tinea unguium: Secondary | ICD-10-CM

## 2023-03-30 NOTE — Progress Notes (Signed)
Subjective:  Patient ID: [name], male    DOB: 07/07/1950,  MRN: [id number]  [name] presents to clinic today for: painful thick toenails that are difficult to trim. Pain interferes with ambulation. Aggravating factors include wearing enclosed shoe gear. Pain is relieved with periodic professional debridement.  Chief Complaint  Patient presents with   Nail Problem    DFC.    PCP is [name], MD.  Allergies  Allergen Reactions   Bupropion Other (See Comments)    Caused altered mental status Altered mental status Caused altered mental status   Ciprofloxacin-Dexamethasone Other (See Comments)    Burning in ears when Cipro ear drops were used unknown Burning in ears when Cipro ear drops were used   Gabapentin Other (See Comments)    Caused weakness, dizziness, and forgetfulness  Weakness and dizziness with higher doses. Weakness and dizziness Caused weakness, dizziness, and forgetfulness   Ciprofloxacin Hcl Other (See Comments)    Burning in ears when drops were used   Clonidine Hcl Other (See Comments)    Review of Systems: Negative except as noted in the HPI.  Objective: No changes noted in today's physical examination. There were no vitals filed for this visit.   [name] is a pleasant 72 y.o. male in NAD. AAO x 3.  Vascular Examination: Capillary refill time <3 seconds b/l LE. Palpable pedal pulses b/l LE. Digital hair present b/l. No pedal edema b/l. Skin temperature gradient WNL b/l. No varicosities b/l. No cyanosis or clubbing noted b/l LE.Marland Kitchen  Dermatological Examination: Pedal skin with normal turgor, texture and tone b/l. No open wounds. No interdigital macerations b/l. Toenails 1-5 b/l thickened, discolored, dystrophic with subungual debris. There is pain on palpation to dorsal aspect of nailplates. No corns, calluses nor porokeratotic lesions noted..  Neurological Examination: Protective sensation intact with 10 gram monofilament  b/l LE. Vibratory sensation intact b/l LE.   Musculoskeletal Examination: Normal muscle strength 5/5 to all lower extremity muscle groups bilaterally. No pain, crepitus or joint limitation noted with ROM b/l LE. No gross bony pedal deformities b/l. Patient ambulates independently without assistive aids. No pain, crepitus or joint limitation noted with ROM bilateral LE. No gross bony deformities bilaterally. Patient ambulates independent of any assistive aids.  Assessment/Plan: 1. Pain due to onychomycosis of toenails of both feet     -Consent given for treatment as described below: -Examined patient. -Toenails 1-5 b/l were debrided in length and girth with sterile nail nippers and dremel without iatrogenic bleeding.  -Patient/POA to call should there be question/concern in the interim.   No follow-ups on file.  Louann Sjogren, DPM

## 2023-04-21 DIAGNOSIS — K219 Gastro-esophageal reflux disease without esophagitis: Secondary | ICD-10-CM | POA: Diagnosis not present

## 2023-04-25 DIAGNOSIS — H7293 Unspecified perforation of tympanic membrane, bilateral: Secondary | ICD-10-CM | POA: Diagnosis not present

## 2023-04-25 DIAGNOSIS — H906 Mixed conductive and sensorineural hearing loss, bilateral: Secondary | ICD-10-CM | POA: Diagnosis not present

## 2023-04-25 DIAGNOSIS — H6123 Impacted cerumen, bilateral: Secondary | ICD-10-CM | POA: Diagnosis not present

## 2023-04-27 DIAGNOSIS — F3340 Major depressive disorder, recurrent, in remission, unspecified: Secondary | ICD-10-CM | POA: Diagnosis not present

## 2023-05-12 DIAGNOSIS — L814 Other melanin hyperpigmentation: Secondary | ICD-10-CM | POA: Diagnosis not present

## 2023-05-12 DIAGNOSIS — Z85828 Personal history of other malignant neoplasm of skin: Secondary | ICD-10-CM | POA: Diagnosis not present

## 2023-05-12 DIAGNOSIS — Z08 Encounter for follow-up examination after completed treatment for malignant neoplasm: Secondary | ICD-10-CM | POA: Diagnosis not present

## 2023-05-12 DIAGNOSIS — D225 Melanocytic nevi of trunk: Secondary | ICD-10-CM | POA: Diagnosis not present

## 2023-05-12 DIAGNOSIS — L821 Other seborrheic keratosis: Secondary | ICD-10-CM | POA: Diagnosis not present

## 2023-05-12 DIAGNOSIS — Z86007 Personal history of in-situ neoplasm of skin: Secondary | ICD-10-CM | POA: Diagnosis not present

## 2023-05-12 DIAGNOSIS — Z872 Personal history of diseases of the skin and subcutaneous tissue: Secondary | ICD-10-CM | POA: Diagnosis not present

## 2023-05-23 DIAGNOSIS — F3340 Major depressive disorder, recurrent, in remission, unspecified: Secondary | ICD-10-CM | POA: Diagnosis not present

## 2023-06-06 DIAGNOSIS — E038 Other specified hypothyroidism: Secondary | ICD-10-CM | POA: Diagnosis not present

## 2023-06-06 DIAGNOSIS — I779 Disorder of arteries and arterioles, unspecified: Secondary | ICD-10-CM | POA: Diagnosis not present

## 2023-06-06 DIAGNOSIS — F988 Other specified behavioral and emotional disorders with onset usually occurring in childhood and adolescence: Secondary | ICD-10-CM | POA: Diagnosis not present

## 2023-06-06 DIAGNOSIS — Z79899 Other long term (current) drug therapy: Secondary | ICD-10-CM | POA: Diagnosis not present

## 2023-06-06 DIAGNOSIS — Z125 Encounter for screening for malignant neoplasm of prostate: Secondary | ICD-10-CM | POA: Diagnosis not present

## 2023-06-06 DIAGNOSIS — R7303 Prediabetes: Secondary | ICD-10-CM | POA: Diagnosis not present

## 2023-06-06 DIAGNOSIS — Z0000 Encounter for general adult medical examination without abnormal findings: Secondary | ICD-10-CM | POA: Diagnosis not present

## 2023-06-07 ENCOUNTER — Ambulatory Visit: Payer: Medicare Other | Admitting: Podiatry

## 2023-06-07 DIAGNOSIS — R7303 Prediabetes: Secondary | ICD-10-CM | POA: Diagnosis not present

## 2023-06-07 DIAGNOSIS — I779 Disorder of arteries and arterioles, unspecified: Secondary | ICD-10-CM | POA: Diagnosis not present

## 2023-06-07 DIAGNOSIS — Z79899 Other long term (current) drug therapy: Secondary | ICD-10-CM | POA: Diagnosis not present

## 2023-06-07 DIAGNOSIS — Z125 Encounter for screening for malignant neoplasm of prostate: Secondary | ICD-10-CM | POA: Diagnosis not present

## 2023-06-27 DIAGNOSIS — H9213 Otorrhea, bilateral: Secondary | ICD-10-CM | POA: Diagnosis not present

## 2023-06-27 DIAGNOSIS — H7293 Unspecified perforation of tympanic membrane, bilateral: Secondary | ICD-10-CM | POA: Diagnosis not present

## 2023-06-27 DIAGNOSIS — H906 Mixed conductive and sensorineural hearing loss, bilateral: Secondary | ICD-10-CM | POA: Diagnosis not present

## 2023-07-20 ENCOUNTER — Telehealth: Payer: Self-pay | Admitting: Internal Medicine

## 2023-07-20 DIAGNOSIS — J019 Acute sinusitis, unspecified: Secondary | ICD-10-CM

## 2023-07-20 NOTE — Telephone Encounter (Signed)
 Patient is having a COPD flare and is in need of an antibiotic and prednisone .   Ph[facility]: [facility]

## 2023-07-21 DIAGNOSIS — J441 Chronic obstructive pulmonary disease with (acute) exacerbation: Secondary | ICD-10-CM | POA: Diagnosis not present

## 2023-07-21 MED ORDER — PREDNISONE 10 MG PO TABS: 40.0000 mg | ORAL_TABLET | Freq: Every day | ORAL | 0 refills | Status: AC

## 2023-07-21 MED ORDER — DOXYCYCLINE HYCLATE 100 MG PO TABS: 100.0000 mg | ORAL_TABLET | Freq: Two times a day (BID) | ORAL | 0 refills | Status: DC

## 2023-07-21 NOTE — Telephone Encounter (Signed)
   Acute SINUSITIS  Plan  - Take doxycycline 100mg  po twice daily x 5 days; take after meals and avoid sunlight - if not getting better/getting worse, then PRED 40mg  daily x 5 days   Both sent        Allergies  Allergen Reactions   Bupropion Other (See Comments)    Caused altered mental status Altered mental status Caused altered mental status   Ciprofloxacin-Dexamethasone Other (See Comments)    Burning in ears when Cipro ear drops were used unknown Burning in ears when Cipro ear drops were used   Gabapentin Other (See Comments)    Caused weakness, dizziness, and forgetfulness  Weakness and dizziness with higher doses. Weakness and dizziness Caused weakness, dizziness, and forgetfulness   Ciprofloxacin Hcl Other (See Comments)    Burning in ears when drops were used   Clonidine Hcl Other (See Comments)      Latest Ref Rng & Units 07/31/2021   11:52 AM 08/04/2020    8:53 AM 03/11/2020   10:06 AM 09/25/2019    4:01 PM  PFT Results  FVC-Pre L 2.99  2.95  P 3.17  3.15   FVC-Predicted Pre % 63  61  P 65  65   FVC-Post L 3.10      FVC-Predicted Post % 65      Pre FEV1/FVC % % 84  84  P 83  83   Post FEV1/FCV % % 87      FEV1-Pre L 2.52  2.48  P 2.64  2.63   FEV1-Predicted Pre % 72  70  P 74  73   FEV1-Post L 2.69      DLCO uncorrected ml/min/mmHg 22.06   24.75  21.79   DLCO UNC% % 80   89  78   DLCO corrected ml/min/mmHg 22.06   24.75  21.32   DLCO COR %Predicted % 80   89  76   DLVA Predicted % 126   121  112   TLC L 4.84      TLC % Predicted % 65      RV % Predicted % 68        P Preliminary result

## 2023-07-21 NOTE — Telephone Encounter (Signed)
Patient is calling back he thinks that he has pneumonia. Please call [phone]

## 2023-07-21 NOTE — Telephone Encounter (Signed)
Pt states he has a sinus infection since 4-5 days ago. Pt states this turned into a cough and this is persistent. Pt states her could feel in his right lung that there is fluid or mucous, and when he coughs or breathe, it is trying to clear. Pt states he is very hoarse from this. Pt states this is mostly dry, but when he does cough it up, the mucous is clear. Pt denies fever, and pt does not notice any sob. Please advise.

## 2023-07-21 NOTE — Telephone Encounter (Signed)
I called and spoke with the pt and notified of response per MR  He verbalized understanding  Nothing further needed

## 2023-07-29 ENCOUNTER — Telehealth (INDEPENDENT_AMBULATORY_CARE_PROVIDER_SITE_OTHER): Payer: Medicare Other | Admitting: Nurse Practitioner

## 2023-07-29 ENCOUNTER — Telehealth: Payer: Self-pay | Admitting: Internal Medicine

## 2023-07-29 DIAGNOSIS — J441 Chronic obstructive pulmonary disease with (acute) exacerbation: Secondary | ICD-10-CM

## 2023-07-29 MED ORDER — AMOXICILLIN-POT CLAVULANATE 875-125 MG PO TABS: 1.0000 | ORAL_TABLET | Freq: Two times a day (BID) | ORAL | 0 refills | Status: AC

## 2023-07-29 MED ORDER — PROMETHAZINE-DM 6.25-15 MG/5ML PO SYRP: 5.0000 mL | ORAL_SOLUTION | Freq: Four times a day (QID) | ORAL | 0 refills | Status: DC | PRN

## 2023-07-29 MED ORDER — BENZONATATE 200 MG PO CAPS: 200.0000 mg | ORAL_CAPSULE | Freq: Three times a day (TID) | ORAL | 1 refills | Status: DC | PRN

## 2023-07-29 MED ORDER — PREDNISONE 10 MG PO TABS: ORAL_TABLET | ORAL | 0 refills | Status: DC

## 2023-07-29 MED ORDER — ALBUTEROL SULFATE (2.5 MG/3ML) 0.083% IN NEBU: 2.5000 mg | INHALATION_SOLUTION | Freq: Four times a day (QID) | RESPIRATORY_TRACT | 5 refills | Status: DC | PRN

## 2023-07-29 NOTE — Telephone Encounter (Signed)
Spoke with the pt and scheduled for virtual visit with KC at 3 pm today.

## 2023-07-29 NOTE — Progress Notes (Unsigned)
Patient ID: [name], male     DOB: 08-03-1950, 73 y.o.      MRN: [id number]  No chief complaint on file.   Virtual Visit via Video Note  I connected with [name] on 08/01/23 at  3:00 PM EST by a video enabled telemedicine application and verified that I am speaking with the correct person using two identifiers.  Location: Patient: Home Provider: Office   I discussed the limitations of evaluation and management by telemedicine and the availability of in person appointments. The patient expressed understanding and agreed to proceed.  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  He is a patient of Dr. Jane Gomez and last seen in office 11/30/2022 by Dr. Celine Gomez.  Past medical history significant for CAD, mitral valve disorder, cerebrovascular disease, Mobitz 1 second-degree AV block, unstable angina, allergic rhinitis, GERD with Barrett's esophagus, hyperparathyroidism status post parathyroidectomy, dementia, HLD.   TEST/EVENTS:  09/25/2019 PFTs: FVC 65, FEV1 73, ratio 83, DLCO corrected for alveolar volume 76% 03/11/2020 PFTs: FVC 65, FEV1 74, ratio 83, DLCOcor 89 08/04/2020 Spirometry: FVC 61, FEV1 70, ratio 84 08/15/2020 HRCT chest: Scattered atherosclerosis.  There is no LAD.  There is a primarily fat-containing hiatal hernia present.  There are scattered tiny centrilobular nodules at the lung apices.  Occasional stable, small definitively benign pulmonary nodules in the left lower lobe.  Minimal paraseptal emphysema is noted.  There is unchanged, mild tubular bronchiectasis.  No significant air trapping present. 07/31/2021 PFTs: FVC 65, FEV1 77, ratio 87, TLC 65, DLCOcor 80.  Mild obstruction and moderate restrictive airway disease with decreased lung volumes.  No significant BD but did have some mid flow reversibility. 11/10/2021 CT chest with contrast:  Atherosclerosis and three-vessel CAD present.  No LAD present.  There is a fat-containing hiatal hernia.  Minimal paraseptal emphysema is present.  There is unchanged mild, tubular bronchiectasis throughout.  Background of very tiny centrilobular pulmonary nodules, most concentrated at the lung apices, most commonly seen in smoking-related bronchiolitis.  More discrete   07/31/2021: OV with Dr. Marchelle Gomez.  Experiences frequent episodes of recurrent bronchitis.  Has had 4 exacerbations in the last year, requiring antibiotic and prednisone.  PFTs were overall stable.  Still has a restriction with normal DLCO suggesting neuromuscular defect.  Has had a positive rheumatoid factor in the past but denied any major arthritis pains.  HRCT from a year ago showed minimal paraseptal emphysema.  Symptom score was stable.  Advised changing inhaler therapy to [employer].  Discussed alternative of including Daliresp to her regimen or doing triple therapy.  Check A1 AT phenotype MM   11/03/2021: OV with Richard Steeves NP for worsening of COPD/bronchitis symptoms.  He reports increased productive cough over the last 5 days with green-yellow sputum production.  He has also had increased shortness of breath upon exertion.  He watches his grandchildren and feels like he has not been able to to do as much with them as he normally can.  Also has some increased generalized weakness.  Has not had any fevers that he is aware of.  He does report some recent decreased appetite.  Does not appear to have lost any weight since I last saw him in November.  Step up to [employer]. Treated with prednisone taper and doxy course. CT chest w contrast to r/o malignancy or other underlying pulm etiology. Close follow up   11/17/2021: OV with Richard Bulow NP  for follow up. He continues to have a productive, congested cough. Felt like the prednisone provided minimal relief this time. Has been having difficulties with this cough for sometime now. CT chest did not show any  worsening to his emphysema or BTX and no concern for malignancy. His shortness of breath is overall stable. Doesn't notice much wheezing and has never really had problems with allergies. He's on omeprazole and pepcid for GERD and feels as though this is well-controlled. He continues on [place], does feel like this helped some with his breathing. He continues on flonase nasal spray, is using mucinex Twice daily and flutter valve 2-3 times a day. FeNO elevated 43 ppb with persistent bronchitic type cough- started on singulair and zyrtec for trigger prevention, and treated with prednisone taper and cough control regimen.    Over the past week, he's felt weaker and has been getting dizzy when he stands. Denies any syncopal events or palpitations. Does occasional have lower extremity swelling, which is a chronic problem for him. He does take metoprolol and his blood pressure was noted to be lower than normal today. Advised that he follow up with cardiology to adjust his BP meds.    12/01/2021: OV with Richard Paver NP for follow-up after being treated for exacerbation of COPD/asthma with bronchitis.  Overall he feels like he has improved significantly.  He did have a rough few weeks with sinusitis and conjunctivitis treated by his PCP with amoxicillin.  Feels like he is finally starting to recover from this.  From a pulmonary standpoint, breathing has been stable and he feels like the [name] has definitely helped him.  Cough has mostly resolved.  He denies any wheezing, hemoptysis, weight loss, anorexia, lower extremity swelling.  He continues on Singulair, Zyrtec and Flonase with good control.  He did go see cardiology after our last visit who lowered his metoprolol and he has not had any dizziness since. Provided with patient assistance for [facility].    03/01/2022: OV with Richard Lubinski NP for follow up. He has been doing well since his last visit. His breathing has been stable and he is very happy that he has been able to go 3  months without needing abx or steroids. His cough has also significantly improved. He definitely feels like the [name] has made a difference; fortunately, he was able to get this approved by [employer]. Denies any wheezing, chest congestion, orthopnea, PND, leg swelling. He completed pulmonary rehab, which has helped his stamina. He has recently noticed some increased breast tissue on the right side; currently working with his PCP to identify the underlying cause.  He also was curious about previously ordered genetic testing; which upon review of his chart, I don't see that Dr. Marchelle Gomez ordered. He received a kit in the mail for immunodeficiency workup but he's not sure who the company was or where the results were mailed back to. He hasn't heard anything and completed the screening around 4-5 months ago. Encouraged him to check with his PCP and rheumatology.  06/08/2022: Ov with Dr. Isaiah Serge for sick visit. Had been feeling well. Complains of 1 week of worsening DOE, cough with green mucus, wheezing. Use OTC counter delsym, mucinex. CXR. Z pack and prednisone burst.  07/21/2022: OV with Clent Ridges NP. Acute visit. Chronic dry cough. Developed postnasal drainage and productive cough. Rx doxycycline and prednisone burst.   11/08/2022: OV with Dr. Wynona Neat. Acute cough. CXR without acute process. Prescribed prednisone and doxycycline.  11/30/2022: OV with Dr. Celine Gomez for acute visit.  Cough not improved. Spasmodic sudden onset. Frequent throat clearing. Uncontrolled reflux. Chronic rhinitis. Does use flonase. Cough worse with deep breaths. Having SOB. [name] has helped a lot. Oxygen level always fine at home. Felt cough primarily related to chronic rhinitis. Switch from Zyrtec to different antihistamine. Add astelin and flonase. Avoid decongestant as tthis is probably causing rebound congestion. Significant irritable larynx syndrome. Trial of speech therapy may help. CT chest ordered as well as COPD with chronic coughing.    07/29/2023: Today - acute Patient presents today for acute visit via virtual visit.  He had started to feel sick last week.  He called into the office on 07/20/2023 with reports of sinus infection, persistent cough and feeling like there was fluid or mucus in his right lung.  Also reported significant voice hoarseness.  He was treated for sinusitis with doxycycline for 5 days and instructed to use prednisone 40 mg daily for 5 days if symptoms did not improve. He then went to his PCP the following day on 1/16 who advised him to go ahead and start the prednisone.  Lungs noted to be clear. He tells me today he feels like he's getting worse. He initially felt better but has slowly declined again. He's coughing and has some chest discomfort on the right when he coughs. He is producing yellow phlegm. He has had some chills but no fevers. The cough does become paroxysmal at times. Not always easy to get up the phlegm. He does feel like his sinus symptoms are improved. He denies any hemoptysis, palpitations, lightheadedness/dizziness, leg swelling, calf pain. He's using his inhalers and breathing treatments. Doing his flonase, singulair and allergy pill. Taking his reflux medications; no significant GERD symptoms.   Allergies  Allergen Reactions   Bupropion Other (See Comments)    Caused altered mental status Altered mental status Caused altered mental status   Ciprofloxacin-Dexamethasone Other (See Comments)    Burning in ears when Cipro ear drops were used unknown Burning in ears when Cipro ear drops were used   Gabapentin Other (See Comments)    Caused weakness, dizziness, and forgetfulness  Weakness and dizziness with higher doses. Weakness and dizziness Caused weakness, dizziness, and forgetfulness   Ciprofloxacin Hcl Other (See Comments)    Burning in ears when drops were used   Clonidine Hcl Other (See Comments)   Immunization History  Administered Date(s) Administered   H1N1 04/25/2008    Influenza Split 04/25/2008, 04/08/2009, 04/03/2010, 04/02/2011, 04/18/2012, 04/27/2013, 04/05/2015, 03/30/2019   Influenza, High Dose Seasonal PF 03/30/2016   Influenza,inj,Quad PF,6+ Mos 04/02/2016, 03/03/2017, 04/28/2018, 04/04/2019, 03/20/2020   Influenza,inj,quad, With Preservative 04/15/2015   Influenza-Unspecified 03/26/2021   PFIZER(Purple Top)SARS-COV-2 Vaccination 08/12/2019, 09/05/2019, 04/15/2020, 10/13/2020   Pfizer Covid-19 Vaccine Bivalent Booster 26yrs & up 03/17/2021   Pneumococcal Conjugate-13 05/08/2015   Pneumococcal Polysaccharide-23 10/24/2007, 03/03/2017   Td 03/26/2004   Tdap 03/13/2013   Zoster, Live 03/13/2013, 05/25/2021   Past Medical History:  Diagnosis Date   Arthritis    Bronchitis    history of   COPD with asthma 12/01/2021   Dysrhythmia    ":Due to MVP"   GERD (gastroesophageal reflux disease)    Hiatal hernia    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 last stress test 2003   Pneumonia 2009   Primary immune deficiency disorder (HCC)    Tumor cells, benign    lung, left side   Unstable angina (HCC) 10/28/2017    Tobacco History: Social History   Tobacco Use  Smoking Status Former   Current packs/day: 0.00   Average packs/day: 1.5 packs/day for 24.0 years (36.0 ttl pk-yrs)   Types: Cigarettes   Start date: 1966   Quit date: 07/05/1985   Years since quitting: 38.0   Passive exposure: Never  Smokeless Tobacco Never   Counseling given: Not Answered   Outpatient Medications Prior to Visit  Medication Sig Dispense Refill   albuterol (VENTOLIN HFA) 108 (90 Base) MCG/ACT inhaler Inhale 2 puffs into the lungs every 6 (six) hours as needed for  wheezing or shortness of breath. 8 g 3   aspirin 81 MG chewable tablet Chew 81 mg by mouth daily.     azelastine (ASTELIN) 0.1 % nasal spray Place 1 spray into both nostrils 2 (two) times daily. Use in each nostril as directed 30 mL 12   Budeson-Glycopyrrol-Formoterol (BREZTRI AEROSPHERE) 160-9-4.8 MCG/ACT AERO Inhale 2 puffs into the lungs 2 (two) times daily. 10.7 g 5   Budeson-Glycopyrrol-Formoterol (BREZTRI AEROSPHERE) 160-9-4.8 MCG/ACT AERO Inhale 2 puffs into the lungs in the morning and at bedtime. 3 each 3   celecoxib (CELEBREX) 200 MG capsule Take 200 mg by mouth daily.     cetirizine (ZYRTEC) 10 MG chewable tablet Chew by mouth.     Docusate Sodium (DSS) 100 MG CAPS Take by mouth.     famotidine (PEPCID) 20 MG tablet Take 1 tablet (20 mg total) by mouth at bedtime. 30 tablet 3   isosorbide mononitrate (IMDUR) 30 MG 24 hr tablet Take 1 tablet (30 mg total) by mouth daily. 90 tablet 3   methylphenidate (RITALIN) 10 MG tablet 1 tablet on empty stomach     metoprolol succinate (TOPROL-XL) 25 MG 24 hr tablet TAKE 1 TABLET DAILY 90 tablet 3   montelukast (SINGULAIR) 10 MG tablet TAKE 1 TABLET BY MOUTH AT BEDTIME 90 tablet 3   Multiple Vitamin (MULTIVITAMIN) tablet Take 1 tablet by mouth daily.     nitroGLYCERIN (NITROSTAT) 0.4 MG SL tablet Place 1 tablet (0.4 mg total) under the tongue every 5 (five) minutes as needed for chest pain. 25 tablet 3   omeprazole (PRILOSEC) 40 MG capsule Take 40 mg by mouth every morning.     polyethylene glycol (MIRALAX / GLYCOLAX) packet Take 17 g by mouth daily as needed (for constipation.).      rosuvastatin (CRESTOR) 10 MG tablet TAKE 1 TABLET(10 MG) BY MOUTH DAILY 90 tablet 3   SYNTHROID 25 MCG tablet Take 25 mcg by mouth daily before breakfast.      tamsulosin (FLOMAX) 0.4 MG CAPS capsule Take 0.4 mg by mouth daily after breakfast.      albuterol (PROVENTIL) (2.5 MG/3ML) 0.083% nebulizer solution Take 3 mLs (2.5 mg total) by nebulization every 6 (six) hours  as needed for wheezing or shortness of breath. 75 mL 5   doxycycline (VIBRA-TABS) 100 MG tablet Take 1 tablet (100 mg total) by mouth 2 (two) times daily. 14 tablet 0   No facility-administered medications prior to visit.     Review of Systems:   Constitutional: No weight loss or gain, night sweats, fevers, chills, fatigue, or lassitude. HEENT: No headaches, difficulty swallowing,  tooth/dental problems, or sore throat. No sneezing, itching, ear ache +improved nasal congestion, post nasal drip CV:  No chest pain, orthopnea, PND, swelling in lower extremities, anasarca, dizziness, palpitations, syncope Resp: +pleuritic pain, shortness of breath with exertion; productive cough; occasional wheeze. No hemoptysis. No chest wall deformity GI:  No heartburn, indigestion, abdominal pain, nausea, vomiting, diarrhea, change in bowel habits, loss of appetite, bloody stools.  GU: No dysuria, change in color of urine, urgency or frequency.  No flank pain, no hematuria  Skin: No rash, lesions, ulcerations MSK:  No joint pain or swelling.   Neuro: No dizziness or lightheadedness.  Psych: No depression or anxiety. Mood stable.   Observations/Objective: Unable to visualize patient due to technical difficulties on patient's end. Unlabored breathing. A&Ox3. Speech is clear and coherent with logical content. Able to speak in complete sentences without difficulties.    Assessment and Plan: Acute exacerbation of COPD with asthma (HCC) Unresolved AECOPD. Pleuritic pain and productive cough despite recent doxycycline course. Unable to obtain imaging today. Will cover him with empiric augmentin for 7 days and restart prednisone taper. Mucociliary clearance and cough control measures advised. Optimize bronchodilator regimen. Continue maintenance inhaler with Breztri. Close follow up. Strict return/ED precautions. Action plan in place.  Patient Instructions  Continue Breztri 2 puffs Twice daily. Brush tongue and  rinse mouth afterwards.  Continue Albuterol inhaler 2 puffs or 3 mL neb every 6 hours as needed for shortness of breath or wheezing. Notify if symptoms persist despite rescue inhaler/neb use. Use neb then flutter valve 2-3 times a day until symptoms improve  Continue flonase nasal spray 1-2 sprays each nostril daily  Continue Flutter valve 2-3 times a day  Continue pepcid 20 mg daily at bedtime Continue omeprazole 40 mg daily for reflux  Continue Singulair 10 mg At bedtime  Continue Zyrtec 10 mg daily   -Mucinex ER Twice daily for chest congestion -Promethazine DM cough syrup 5 mL every 6 hours as needed for cough. May cause drowsiness. Do not drive after taking. Do not take within 6 hours of other over the counter cough syrups  -Benzonatate 1 capsule Three times a day as needed for cough -Prednisone taper. 4 tabs for 3 days, then 3 tabs for 3 days, 2 tabs for 3 days, then 1 tab for 3 days, then stop. Take in AM with food -Augmentin 1 tab Twice daily for 7 days. Take with food. Take a daily probiotic while on this antibiotic   If symptoms worsen, go to urgent care or the emergency department over the weekend    Follow up in 2-3 weeks with Dr. Marchelle Gomez or Philis Nettle. If symptoms do not improve or worsen, please contact office for sooner follow up or seek emergency care   I discussed the assessment and treatment plan with the patient. The patient was provided an opportunity to ask questions and all were answered. The patient agreed with the plan and demonstrated an understanding of the instructions.   The patient was advised to call back or seek an in-person evaluation if the symptoms worsen or if the condition fails to improve as anticipated.  I provided 35 minutes of non-face-to-face time during this encounter.   Noemi Chapel, NP

## 2023-07-29 NOTE — Patient Instructions (Signed)
Continue Breztri 2 puffs Twice daily. Brush tongue and rinse mouth afterwards.  Continue Albuterol inhaler 2 puffs or 3 mL neb every 6 hours as needed for shortness of breath or wheezing. Notify if symptoms persist despite rescue inhaler/neb use. Use neb then flutter valve 2-3 times a day until symptoms improve  Continue flonase nasal spray 1-2 sprays each nostril daily  Continue Flutter valve 2-3 times a day  Continue pepcid 20 mg daily at bedtime Continue omeprazole 40 mg daily for reflux  Continue Singulair 10 mg At bedtime  Continue Zyrtec 10 mg daily   -Mucinex ER Twice daily for chest congestion -Promethazine DM cough syrup 5 mL every 6 hours as needed for cough. May cause drowsiness. Do not drive after taking. Do not take within 6 hours of other over the counter cough syrups  -Benzonatate 1 capsule Three times a day as needed for cough -Prednisone taper. 4 tabs for 3 days, then 3 tabs for 3 days, 2 tabs for 3 days, then 1 tab for 3 days, then stop. Take in AM with food -Augmentin 1 tab Twice daily for 7 days. Take with food. Take a daily probiotic while on this antibiotic   If symptoms worsen, go to urgent care or the emergency department over the weekend    Follow up in 2-3 weeks with Dr. Marchelle Gearing or Philis Nettle. If symptoms do not improve or worsen, please contact office for sooner follow up or seek emergency care

## 2023-07-29 NOTE — Telephone Encounter (Signed)
PT still not well. Wonders if he should take another round of meds. His # is [phone]  Pharm is: [facility]

## 2023-08-01 ENCOUNTER — Encounter: Payer: Self-pay | Admitting: Nurse Practitioner

## 2023-08-01 NOTE — Assessment & Plan Note (Signed)
Unresolved AECOPD. Pleuritic pain and productive cough despite recent doxycycline course. Unable to obtain imaging today. Will cover him with empiric augmentin for 7 days and restart prednisone taper. Mucociliary clearance and cough control measures advised. Optimize bronchodilator regimen. Continue maintenance inhaler with Breztri. Close follow up. Strict return/ED precautions. Action plan in place.  Patient Instructions  Continue Breztri 2 puffs Twice daily. Brush tongue and rinse mouth afterwards.  Continue Albuterol inhaler 2 puffs or 3 mL neb every 6 hours as needed for shortness of breath or wheezing. Notify if symptoms persist despite rescue inhaler/neb use. Use neb then flutter valve 2-3 times a day until symptoms improve  Continue flonase nasal spray 1-2 sprays each nostril daily  Continue Flutter valve 2-3 times a day  Continue pepcid 20 mg daily at bedtime Continue omeprazole 40 mg daily for reflux  Continue Singulair 10 mg At bedtime  Continue Zyrtec 10 mg daily   -Mucinex ER Twice daily for chest congestion -Promethazine DM cough syrup 5 mL every 6 hours as needed for cough. May cause drowsiness. Do not drive after taking. Do not take within 6 hours of other over the counter cough syrups  -Benzonatate 1 capsule Three times a day as needed for cough -Prednisone taper. 4 tabs for 3 days, then 3 tabs for 3 days, 2 tabs for 3 days, then 1 tab for 3 days, then stop. Take in AM with food -Augmentin 1 tab Twice daily for 7 days. Take with food. Take a daily probiotic while on this antibiotic   If symptoms worsen, go to urgent care or the emergency department over the weekend    Follow up in 2-3 weeks with Dr. Marchelle Gearing or Philis Nettle. If symptoms do not improve or worsen, please contact office for sooner follow up or seek emergency care

## 2023-08-02 NOTE — Telephone Encounter (Signed)
Katie,  Please see patient comment and advise. Thank you.

## 2023-08-12 NOTE — Telephone Encounter (Signed)
 If he's feeling worse after the abx and steroids, recommend he be evaluated at the ED. He should be feeling better, including his breathing, after two courses of steroids and two different abx. It's possible there is something else driving his SOB - cardiac, PE, etc so that is why I recommend he have further workup. Thanks.

## 2023-08-12 NOTE — Telephone Encounter (Signed)
 [name], please see new mychart messages sent by pt and advise.

## 2023-08-15 ENCOUNTER — Telehealth: Payer: Self-pay | Admitting: Internal Medicine

## 2023-08-15 ENCOUNTER — Emergency Department (HOSPITAL_COMMUNITY): Payer: Medicare Other

## 2023-08-15 ENCOUNTER — Emergency Department (HOSPITAL_COMMUNITY)
Admission: EM | Admit: 2023-08-15 | Discharge: 2023-08-15 | Disposition: A | Discharge status: Home / Self Care | Payer: Medicare Other | Attending: Emergency Medicine | Admitting: Emergency Medicine

## 2023-08-15 ENCOUNTER — Other Ambulatory Visit: Payer: Self-pay

## 2023-08-15 DIAGNOSIS — Z20822 Contact with and (suspected) exposure to covid-19: Secondary | ICD-10-CM | POA: Insufficient documentation

## 2023-08-15 DIAGNOSIS — R5383 Other fatigue: Secondary | ICD-10-CM | POA: Insufficient documentation

## 2023-08-15 DIAGNOSIS — R059 Cough, unspecified: Secondary | ICD-10-CM | POA: Diagnosis not present

## 2023-08-15 DIAGNOSIS — J449 Chronic obstructive pulmonary disease, unspecified: Secondary | ICD-10-CM | POA: Diagnosis not present

## 2023-08-15 DIAGNOSIS — J439 Emphysema, unspecified: Secondary | ICD-10-CM | POA: Diagnosis not present

## 2023-08-15 DIAGNOSIS — J479 Bronchiectasis, uncomplicated: Secondary | ICD-10-CM | POA: Diagnosis not present

## 2023-08-15 DIAGNOSIS — I2510 Atherosclerotic heart disease of native coronary artery without angina pectoris: Secondary | ICD-10-CM | POA: Diagnosis not present

## 2023-08-15 DIAGNOSIS — R0602 Shortness of breath: Secondary | ICD-10-CM | POA: Diagnosis not present

## 2023-08-15 DIAGNOSIS — R918 Other nonspecific abnormal finding of lung field: Secondary | ICD-10-CM | POA: Diagnosis not present

## 2023-08-15 LAB — CBC WITH DIFFERENTIAL/PLATELET
Abs Immature Granulocytes: 0.03 10*3/uL (ref 0.00–0.07)
Basophils Absolute: 0 10*3/uL (ref 0.0–0.1)
Basophils Relative: 0 %
Eosinophils Absolute: 0.1 10*3/uL (ref 0.0–0.5)
Eosinophils Relative: 2 %
HCT: 45 % (ref 39.0–52.0)
Hemoglobin: 15.1 g/dL (ref 13.0–17.0)
Immature Granulocytes: 1 %
Lymphocytes Relative: 16 %
Lymphs Abs: 0.9 10*3/uL (ref 0.7–4.0)
MCH: 31 pg (ref 26.0–34.0)
MCHC: 33.6 g/dL (ref 30.0–36.0)
MCV: 92.4 fL (ref 80.0–100.0)
Monocytes Absolute: 0.5 10*3/uL (ref 0.1–1.0)
Monocytes Relative: 9 %
Neutro Abs: 3.9 10*3/uL (ref 1.7–7.7)
Neutrophils Relative %: 72 %
Platelets: 121 10*3/uL — ABNORMAL LOW (ref 150–400)
RBC: 4.87 MIL/uL (ref 4.22–5.81)
RDW: 12 % (ref 11.5–15.5)
WBC: 5.4 10*3/uL (ref 4.0–10.5)
nRBC: 0 % (ref 0.0–0.2)

## 2023-08-15 LAB — COMPREHENSIVE METABOLIC PANEL
ALT: 34 U/L (ref 0–44)
AST: 30 U/L (ref 15–41)
Albumin: 3.5 g/dL (ref 3.5–5.0)
Alkaline Phosphatase: 56 U/L (ref 38–126)
Anion gap: 10 (ref 5–15)
BUN: 24 mg/dL — ABNORMAL HIGH (ref 8–23)
CO2: 25 mmol/L (ref 22–32)
Calcium: 9.2 mg/dL (ref 8.9–10.3)
Chloride: 105 mmol/L (ref 98–111)
Creatinine, Ser: 1.11 mg/dL (ref 0.61–1.24)
GFR, Estimated: >60 mL/min (ref >60)
Glucose, Bld: 135 mg/dL — ABNORMAL HIGH (ref 70–99)
Potassium: 4.3 mmol/L (ref 3.5–5.1)
Sodium: 140 mmol/L (ref 135–145)
Total Bilirubin: 0.7 mg/dL (ref 0.0–1.2)
Total Protein: 6.1 g/dL — ABNORMAL LOW (ref 6.5–8.1)

## 2023-08-15 LAB — BRAIN NATRIURETIC PEPTIDE: B Natriuretic Peptide: 35.1 pg/mL (ref 0.0–100.0)

## 2023-08-15 LAB — RESP PANEL BY RT-PCR (RSV, FLU A&B, COVID)  RVPGX2
Influenza A by PCR: NEGATIVE
Influenza B by PCR: NEGATIVE
Resp Syncytial Virus by PCR: NEGATIVE
SARS Coronavirus 2 by RT PCR: NEGATIVE

## 2023-08-15 LAB — TROPONIN I (HIGH SENSITIVITY): Troponin I (High Sensitivity): 3 ng/L (ref <18)

## 2023-08-15 MED ORDER — IOHEXOL 350 MG/ML SOLN
75.0000 mL | Freq: Once | INTRAVENOUS | Status: AC | PRN
  Administered 2023-08-15: 75 mL via INTRAVENOUS

## 2023-08-15 NOTE — ED Triage Notes (Signed)
 Pt. Stated, Ive had SOb, cough, fatigue, and loss of appetite for about 6 weeks. My DR. Sent me over here for further evaluation

## 2023-08-15 NOTE — Discharge Instructions (Addendum)
 You were seen in the emergency department for continuing shortness of breath.  You had lab work showed no signs of anemia or heart attack or heart failure.  Your COVID and flu tests were negative.  CAT scan of your chest did not show any signs of blood clot or pneumonia.  Please contact your pulmonology team tomorrow to let them review your results.  Continue your regular medications.  Return if any worsening or concerning symptoms

## 2023-08-15 NOTE — ED Provider Triage Note (Signed)
Emergency Medicine Provider Triage Evaluation Note  [name] , a 73 y.o. male  was evaluated in triage.  Pt complains of shortness of breath.  Patient reports his pulmonologist advised him to come in for evaluation they are concerned that he could have a pulmonary embolus.  Patient reports that he has been on antibiotics and steroids for the last 20 days and he continues to have increasing shortness of breath.  Review of Systems  Positive: Cough Negative: Fever  Physical Exam  BP 133/79 (BP Location: Left Arm)   Pulse 94   Temp 97.8 F (36.6 C)   Resp 18   Ht 5\' 10"  (1.778 m)   Wt 78.9 kg   SpO2 99%   BMI 24.97 kg/m  Gen:   Awake, no distress   Resp:  Normal effort  MSK:   Moves extremities without difficulty  Other:    Medical Decision Making  Medically screening exam initiated at 11:22 AM.  Appropriate orders placed.  [name] was informed that the remainder of the evaluation will be completed by another provider, this initial triage assessment does not replace that evaluation, and the importance of remaining in the ED until their evaluation is complete.     [name], [place] 08/15/23 1124

## 2023-08-15 NOTE — ED Provider Notes (Signed)
 [place] EMERGENCY DEPARTMENT AT [facility] Provider Note   CSN: [id number] Arrival date & time: 08/15/23  1038     History  Chief Complaint  Patient presents with   Shortness of Breath   Cough   Fatigue    [name] is a 73 y.o. male.  He has a history of COPD follows with pulmonology.  He has had worsening of his pulmonary status over the last month.  He has had virtual visits with pulmonology.  He has been on 2 courses of steroids and 2 courses of antibiotics and just does not feel like he has bounced back to his baseline.  After further discussion with pulmonology they recommended he come here to get some lab work and imaging.  He denies having a fever is not having any chest pain.  Cough has been productive of some white sputum.  Sometimes hears some wheezing at night.  Also newly has been hearing his pulse in his ears more than usual.  Wife is unfortunately admitted in the hospital and he is hoping to go up and see her once he is down here.  The history is provided by the patient.  Shortness of Breath Severity:  Moderate Onset quality:  Gradual Duration:  1 month Timing:  Constant Progression:  Unchanged Chronicity:  Recurrent Relieved by:  Nothing Worsened by:  Activity Ineffective treatments:  Inhaler, rest and position changes Associated symptoms: cough, sputum production and wheezing   Associated symptoms: no abdominal pain, no chest pain, no fever and no hemoptysis   Cough Associated symptoms: shortness of breath and wheezing   Associated symptoms: no chest pain and no fever        Home Medications Prior to Admission medications   Medication Sig Start Date End Date Taking? Authorizing Provider  albuterol  (PROVENTIL ) (2.5 MG/3ML) 0.083% nebulizer solution Take 3 mLs (2.5 mg total) by nebulization every 6 (six) hours as needed for wheezing or shortness of breath. 07/29/23   Cobb, Mariah Shines, NP  albuterol  (VENTOLIN  HFA) 108 (90 Base) MCG/ACT  inhaler Inhale 2 puffs into the lungs every 6 (six) hours as needed for wheezing or shortness of breath. 12/01/22   Aleck Hurdle, MD  aspirin  81 MG chewable tablet Chew 81 mg by mouth daily.    [provider]  azelastine  (ASTELIN ) 0.1 % nasal spray Place 1 spray into both nostrils 2 (two) times daily. Use in each nostril as directed 11/30/22   Aleck Hurdle, MD  benzonatate  (TESSALON ) 200 MG capsule Take 1 capsule (200 mg total) by mouth 3 (three) times daily as needed for cough. 07/29/23   Cobb, Mariah Shines, NP  Budeson-Glycopyrrol-Formoterol (BREZTRI  AEROSPHERE) 160-9-4.8 MCG/ACT AERO Inhale 2 puffs into the lungs 2 (two) times daily. 03/09/23   Cobb, Mariah Shines, NP  Budeson-Glycopyrrol-Formoterol (BREZTRI  AEROSPHERE) 160-9-4.8 MCG/ACT AERO Inhale 2 puffs into the lungs in the morning and at bedtime. 03/21/23   Myer Artis A, MD  celecoxib (CELEBREX) 200 MG capsule Take 200 mg by mouth daily. 03/04/20   [provider]  cetirizine (ZYRTEC) 10 MG chewable tablet Chew by mouth.    [provider]  Docusate Sodium  (DSS) 100 MG CAPS Take by mouth.    [provider]  famotidine  (PEPCID ) 20 MG tablet Take 1 tablet (20 mg total) by mouth at bedtime. 09/09/15   Abrol, Nayana, MD  isosorbide  mononitrate (IMDUR ) 30 MG 24 hr tablet Take 1 tablet (30 mg total) by mouth daily. 03/11/23 06/09/23  Gerald Kitty., NP  methylphenidate  (RITALIN ) 10 MG tablet 1 tablet on empty stomach 06/23/21   [provider]  metoprolol  succinate (TOPROL -XL) 25 MG 24 hr tablet TAKE 1 TABLET DAILY 03/21/23   Nahser, Lela Purple, MD  montelukast  (SINGULAIR ) 10 MG tablet TAKE 1 TABLET BY MOUTH AT BEDTIME 05/14/22   Maire Scot, MD  Multiple Vitamin (MULTIVITAMIN) tablet Take 1 tablet by mouth daily.    [provider]  nitroGLYCERIN  (NITROSTAT ) 0.4 MG SL tablet Place 1 tablet (0.4 mg total) under the tongue every 5 (five) minutes as needed for chest pain. 03/09/23   Nahser,  Lela Purple, MD  omeprazole (PRILOSEC) 40 MG capsule Take 40 mg by mouth every morning. 01/28/21   [provider]  polyethylene glycol (MIRALAX  / GLYCOLAX ) packet Take 17 g by mouth daily as needed (for constipation.).     [provider]  predniSONE  (DELTASONE ) 10 MG tablet 4 tabs for 3 days, then 3 tabs for 3 days, 2 tabs for 3 days, then 1 tab for 3 days, then stop 07/29/23   Cobb, Mariah Shines, NP  promethazine -dextromethorphan  (PROMETHAZINE -DM) 6.25-15 MG/5ML syrup Take 5 mLs by mouth 4 (four) times daily as needed. 07/29/23   Cobb, Mariah Shines, NP  rosuvastatin  (CRESTOR ) 10 MG tablet TAKE 1 TABLET(10 MG) BY MOUTH DAILY 05/03/19   Nahser, Lela Purple, MD  SYNTHROID 25 MCG tablet Take 25 mcg by mouth daily before breakfast.  02/05/19   [provider]  tamsulosin  (FLOMAX ) 0.4 MG CAPS capsule Take 0.4 mg by mouth daily after breakfast.     [provider]      Allergies    Bupropion , Ciprofloxacin -dexamethasone , Gabapentin , Ciprofloxacin  hcl, and Clonidine hcl    Review of Systems   Review of Systems  Constitutional:  Negative for fever.  Respiratory:  Positive for cough, sputum production, shortness of breath and wheezing. Negative for hemoptysis.   Cardiovascular:  Negative for chest pain.  Gastrointestinal:  Negative for abdominal pain.    Physical Exam Updated Vital Signs BP 133/79 (BP Location: Left Arm)   Pulse 94   Temp 97.8 F (36.6 C)   Resp 18   Ht 5\' 10"  (1.778 m)   Wt 78.9 kg   SpO2 99%   BMI 24.97 kg/m  Physical Exam Vitals and nursing note reviewed.  Constitutional:      General: He is not in acute distress.    Appearance: He is well-developed.  HENT:     Head: Normocephalic and atraumatic.  Eyes:     Conjunctiva/sclera: Conjunctivae normal.  Cardiovascular:     Rate and Rhythm: Normal rate and regular rhythm.     Heart sounds: No murmur heard. Pulmonary:     Effort: Pulmonary effort is normal. No respiratory distress.      Breath sounds: Examination of the right-lower field reveals wheezing and rhonchi. Wheezing and rhonchi present.  Abdominal:     Palpations: Abdomen is soft.     Tenderness: There is no abdominal tenderness.  Musculoskeletal:        General: No swelling.     Cervical back: Neck supple.     Right lower leg: No tenderness. Edema present.     Left lower leg: No tenderness. Edema present.     Comments: He has trace edema lower extremities which she says is baseline for him  Skin:    General: Skin is warm and dry.     Capillary Refill: Capillary refill takes less than 2 seconds.  Neurological:     General: No focal deficit present.     Mental Status: He is alert.     ED Results / Procedures / Treatments   Labs (all labs ordered are listed, but only abnormal results are displayed) Labs Reviewed  CBC WITH DIFFERENTIAL/PLATELET - Abnormal; Notable for the following components:      Result Value   Platelets 121 (*)    All other components within normal limits  COMPREHENSIVE METABOLIC PANEL - Abnormal; Notable for the following components:   Glucose, Bld 135 (*)    BUN 24 (*)    Total Protein 6.1 (*)    All other components within normal limits  RESP PANEL BY RT-PCR (RSV, FLU A&B, COVID)  [id number]  BRAIN NATRIURETIC PEPTIDE  TROPONIN I (HIGH SENSITIVITY)    EKG EKG Interpretation Date/Time:  Monday August 15 2023 10:53:34 EST Ventricular Rate:  97 PR Interval:  138 QRS Duration:  78 QT Interval:  332 QTC Calculation: 421 R Axis:   92  Text Interpretation: Normal sinus rhythm Rightward axis Low voltage QRS Cannot rule out Anterior infarct , age undetermined Abnormal ECG When compared with ECG of 11-Mar-2023 15:17, No significant change since last tracing Confirmed by Racheal Buddle [phone]) on 08/15/2023 10:56:05 AM  Radiology CT Angio Chest PE W and/or Wo Contrast Result Date: 08/15/2023 CLINICAL DATA:  Shortness of breath, positive D-dimer EXAM: CT ANGIOGRAPHY CHEST WITH  CONTRAST TECHNIQUE: Multidetector CT imaging of the chest was performed using the standard protocol during bolus administration of intravenous contrast. Multiplanar CT image reconstructions and MIPs were obtained to evaluate the vascular anatomy. RADIATION DOSE REDUCTION: This exam was performed according to the departmental dose-optimization program which includes automated exposure control, adjustment of the mA and/or kV according to patient size and/or use of iterative reconstruction technique. CONTRAST:  75mL OMNIPAQUE  IOHEXOL  350 MG/ML SOLN COMPARISON:  11/17/2022, 11/10/2021 FINDINGS: Cardiovascular: This is a technically adequate evaluation of the pulmonary vasculature. No filling defects or pulmonary emboli. The heart is unremarkable without pericardial effusion. No evidence of thoracic aortic aneurysm or dissection. Atherosclerosis of the aortic arch and coronary vasculature. Mediastinum/Nodes: No enlarged mediastinal, hilar, or axillary lymph nodes. Thyroid  gland, trachea, and esophagus demonstrate no significant findings. Small fat containing hiatal hernia. Lungs/Pleura: Mild upper lobe predominant emphysema. No acute airspace disease, effusion, or pneumothorax. Mild bronchiectasis unchanged. The tiny centrilobular pulmonary nodules seen at the lung apices on prior study are again noted and unchanged. Multiple other discrete sub 5 mm pulmonary nodules are again seen. Index nodule in the left lower lobe reference image 60/7 measures 5 mm, unchanged. Upper Abdomen: No acute abnormality. Musculoskeletal: No acute or destructive bony abnormalities. Reconstructed images demonstrate no additional findings. Review of the MIP images confirms the above findings. IMPRESSION: 1. No evidence of pulmonary embolus. 2. Stable sub 5 mm bilateral pulmonary nodules, unchanged since prior study and consistent with benign etiologies. No specific follow-up recommended. 3.  Emphysema (ICD10-J43.9).  Mild bronchiectasis  unchanged. 4. Aortic Atherosclerosis (ICD10-I70.0). Coronary artery atherosclerosis. Electronically Signed   By: Bobbye Burrow M.D.   On: 08/15/2023 15:52    Procedures Procedures    Medications Ordered in ED Medications - No data to display  ED Course/ Medical Decision Making/ A&P Clinical Course as of 08/15/23 1759  Mon Aug 15, 2023  1558 Patient's workup has been essentially unremarkable.  His PE study was negative for any acute findings.  I reviewed this with him and he is comfortable plan for discharge. [MB]  Clinical Course User Index [MB] Tonya Fredrickson, MD                                 Medical Decision Making Amount and/or Complexity of Data Reviewed Labs: ordered.   This patient complains of shortness of breath; this involves an extensive number of treatment Options and is a complaint that carries with it a high risk of complications and morbidity. The differential includes COPD, CHF, pneumonia, COVID, flu, PE, pneumothorax  I ordered, reviewed and interpreted labs, which included CBC normal other than mildly low platelets, chemistries unremarkable, COVID and flu negative, troponins and BNP normal I ordered imaging studies which included CT chest and I independently    visualized and interpreted imaging which showed no acute findings Previous records obtained and reviewed in epic including recent pulmonology notes Cardiac monitoring reviewed, normal sinus rhythm Social determinants considered, no significant barriers Critical Interventions: None  After the interventions stated above, I reevaluated the patient and found patient to be satting well on room air in no distress  Admission and further testing considered, no indications for admission at this time.  Recommended close follow-up with his pulmonology team.  Return instructions discussed         Final Clinical Impression(s) / ED Diagnoses Final diagnoses:  Shortness of breath    Rx / DC  Orders ED Discharge Orders     None         Tonya Fredrickson, MD 08/15/23 1801

## 2023-08-16 ENCOUNTER — Ambulatory Visit: Payer: Medicare Other | Admitting: Podiatry

## 2023-08-16 ENCOUNTER — Ambulatory Visit (INDEPENDENT_AMBULATORY_CARE_PROVIDER_SITE_OTHER): Payer: Medicare Other | Admitting: Family

## 2023-08-16 ENCOUNTER — Encounter (HOSPITAL_BASED_OUTPATIENT_CLINIC_OR_DEPARTMENT_OTHER): Payer: Self-pay | Admitting: Family

## 2023-08-16 VITALS — BP 120/74 | HR 114 | Ht 70.0 in | Wt 169.0 lb

## 2023-08-16 DIAGNOSIS — I25118 Atherosclerotic heart disease of native coronary artery with other forms of angina pectoris: Secondary | ICD-10-CM | POA: Diagnosis not present

## 2023-08-16 DIAGNOSIS — E785 Hyperlipidemia, unspecified: Secondary | ICD-10-CM | POA: Diagnosis not present

## 2023-08-16 DIAGNOSIS — R0609 Other forms of dyspnea: Secondary | ICD-10-CM | POA: Diagnosis not present

## 2023-08-16 DIAGNOSIS — Z8709 Personal history of other diseases of the respiratory system: Secondary | ICD-10-CM | POA: Diagnosis not present

## 2023-08-16 NOTE — Patient Instructions (Signed)
Medication Instructions:  Your physician recommends that you continue on your current medications as directed. Please refer to the Current Medication list given to you today.  Testing/Procedures:    Please report to Radiology at the [facility] Main Entrance 30 minutes early for your test.  [address] [place], [place] [address]                         OR   Please report to Radiology at [facility] Main Entrance, medical mall, 30 mins prior to your test.  [address]  [place], [place]  How to Prepare for Your Cardiac PET/CT Stress Test:  Nothing to eat or drink, except water, 3 hours prior to arrival time.  NO caffeine/decaffeinated products, or chocolate 12 hours prior to arrival. (Please note decaffeinated beverages (teas/coffees) still contain caffeine).  If you have caffeine within 12 hours prior, the test will need to be rescheduled.  Medication instructions: Do not take erectile dysfunction medications for 72 hours prior to test (sildenafil, tadalafil) Do not take nitrates (isosorbide mononitrate, Ranexa) the day before or day of test Do not take tamsulosin the day before or morning of test Hold theophylline containing medications for 12 hours. Hold Dipyridamole 48 hours prior to the test.  Diabetic Preparation: If able to eat breakfast prior to 3 hour fasting, you may take all medications, including your insulin. Do not worry if you miss your breakfast dose of insulin - start at your next meal. If you do not eat prior to 3 hour fast-Hold all diabetes (oral and insulin) medications. Patients who wear a continuous glucose monitor MUST remove the device prior to scanning.  You may take your remaining medications with water.  NO perfume, cologne or lotion on chest or abdomen area. FEMALES - Please avoid wearing dresses to this appointment.  Total time is 1 to 2 hours; you may want to bring reading material for the waiting  time.  IF YOU THINK YOU MAY BE PREGNANT, OR ARE NURSING PLEASE INFORM THE TECHNOLOGIST.  In preparation for your appointment, medication and supplies will be purchased.  Appointment availability is limited, so if you need to cancel or reschedule, please call the Radiology Department Scheduler at [phone] 24 hours in advance to avoid a cancellation fee of $100.00  What to Expect When you Arrive:  Once you arrive and check in for your appointment, you will be taken to a preparation room within the Radiology Department.  A technologist or Nurse will obtain your medical history, verify that you are correctly prepped for the exam, and explain the procedure.  Afterwards, an IV will be started in your arm and electrodes will be placed on your skin for EKG monitoring during the stress portion of the exam. Then you will be escorted to the PET/CT scanner.  There, staff will get you positioned on the scanner and obtain a blood pressure and EKG.  During the exam, you will continue to be connected to the EKG and blood pressure machines.  A small, safe amount of a radioactive tracer will be injected in your IV to obtain a series of pictures of your heart along with an injection of a stress agent.    After your Exam:  It is recommended that you eat a meal and drink a caffeinated beverage to counter act any effects of the stress agent.  Drink plenty of fluids for the remainder of the day and urinate  frequently for the first couple of hours after the exam.  Your doctor will inform you of your test results within 7-10 business days.  For more information and frequently asked questions, please visit our website: [email or web address]  For questions about your test or how to prepare for your test, please call: Cardiac Imaging Nurse Navigators Office: [phone]   Follow-Up: At [facility], you and your health needs are our priority.  As part of our continuing mission to provide you  with exceptional heart care, we have created designated Provider Care Teams.  These Care Teams include your primary Cardiologist (physician) and Advanced Practice Providers (APPs -  Physician Assistants and Nurse Practitioners) who all work together to provide you with the care you need, when you need it.  We recommend signing up for the patient portal called "MyChart".  Sign up information is provided on this After Visit Summary.  MyChart is used to connect with patients for Virtual Visits (Telemedicine).  Patients are able to view lab/test results, encounter notes, upcoming appointments, etc.  Non-urgent messages can be sent to your provider as well.   To learn more about what you can do with MyChart, go to [email or web address].    Your next appointment:   Follow up in 2-3 months with Dr. Elease Hashimoto or Gillian Shields, NP

## 2023-08-16 NOTE — Progress Notes (Unsigned)
Cardiology Office Note:  .   Date:  08/17/2023  ID:  [name], DOB Sep 17, 1950, MRN [id number] PCP: [name], MD  [facility] Providers Cardiologist:  Kristeen Miss, MD    History of Present Illness: .   [name] is a 73 y.o. male with hx of nonobstructive CAD by [facility] 2019, GERD/Barret's esophagus, hiatal hernia, ILD, mild carotid stenosis.   Last seen 03/2023 with sharp central chest pain at rest or with activity.  Imdur 30 mg initiated.  ED visit yesterday with dyspnea. Had recently completed courses of abx and steroids with pulmonology. BNP, troponin normal. COVID, flu, RSV negative. CBC only notable for Plt 121. CMP unremarkable. CT Angio chest PE no PE, stable sub 5mm bilateral pulmonary nodules which are benign, mild bronchiectasis unchanged, aortic and coronary atherosclerosis noted.  He presents today for follow up indpendently. He notes usually when he takes prednisone and antibiotics his symptoms resolve. However, this time his cough has persisted. Reports productive cough with white phlegm (previously was yellow/green). The cough is occasionally waking him up at night. Has not had wheezing for the last 4 days. He also notes he is very weak with walking and feels "wiped out" which is different than his baseline. Notes dyspnea with as little activity as taking a shower. Reports it takes him 10-12 minutes to recover. He is uncertain if this is related to his COPE or his heart. He has had no chest pain, pressure, tightness. No diaphoresis, palpitations, orthopnea. Mild bilateral LE edema with sock line over the last 2 months.   ROS: Please see the history of present illness.    All other systems reviewed and are negative.   Studies Reviewed: .        Cardiac Studies & Procedures   ______________________________________________________________________________________________ CARDIAC CATHETERIZATION  CARDIAC CATHETERIZATION 10/31/2017  Narrative  Ost LAD  to Prox LAD lesion is 20% stenosed.  Prox LAD to Mid LAD lesion is 10% stenosed.  Non-stenotic Prox Cx lesion.  Prox RCA lesion is 20% stenosed.  Dist RCA-1 lesion is 30% stenosed.  Dist RCA-2 lesion is 20% stenosed.  Mild nonobstructive CAD with 20% proximal LAD stenosis, luminal irregularity of the mid LAD; ectasia of the mid circumflex without stenosis, and 20% proximal and 30 and 20% distal RCA stenoses.  Normal LV function without focal segmental wall motion abnormalities.  Mynx closure device of the right femoral access site.  RECOMMENDATION: Medical therapy  Findings Coronary Findings Diagnostic  Dominance: Right  Left Anterior Descending Ost LAD to Prox LAD lesion is 20% stenosed. Prox LAD to Mid LAD lesion is 10% stenosed.  Left Circumflex Non-stenotic Prox Cx lesion.  Right Coronary Artery Prox RCA lesion is 20% stenosed. Dist RCA-1 lesion is 30% stenosed. Dist RCA-2 lesion is 20% stenosed.  Intervention  No interventions have been documented.   STRESS TESTS  EXERCISE TOLERANCE TEST (ETT) 05/23/2015  Narrative  There was no ST segment deviation noted during stress.   ECHOCARDIOGRAM  ECHOCARDIOGRAM COMPLETE 11/09/2019  Narrative ECHOCARDIOGRAM REPORT    Patient Name:   VANE YAPP Date of Exam: 11/09/2019 Medical Rec #:  [id number]       Height:       71.0 in Accession #:    [id number]      Weight:       174.6 lb Date of Birth:  Nov 05, 1950      BSA:          1.990 m Patient Age:  68 years        BP:           98/62 mmHg Patient Gender: M               HR:           66 bpm. Exam Location:  [address]  Procedure: 2D Echo, Cardiac Doppler and Color Doppler  Indications:    I25.10  History:        Patient has prior history of Echocardiogram examinations, most recent 01/10/2019. CAD; Signs/Symptoms:Shortness of Breath and Tachycardia.  Sonographer:    Samule Ohm RDCS Referring Phys: [id number] Evern Bio WEAVER  IMPRESSIONS   1. Left  ventricular ejection fraction, by estimation, is 65 to 70%. The left ventricle has hyperdynamic function. The left ventricle has no regional wall motion abnormalities. There is mild concentric left ventricular hypertrophy. Left ventricular diastolic parameters were normal. 2. Right ventricular systolic function is normal. The right ventricular size is normal. There is normal pulmonary artery systolic pressure. The estimated right ventricular systolic pressure is 19.2 mmHg. 3. The mitral valve is normal in structure. Trivial mitral valve regurgitation. No evidence of mitral stenosis. 4. The aortic valve is normal in structure. Aortic valve regurgitation is not visualized. No aortic stenosis is present. 5. The inferior vena cava is normal in size with greater than 50% respiratory variability, suggesting right atrial pressure of 3 mmHg.  FINDINGS Left Ventricle: Left ventricular ejection fraction, by estimation, is 65 to 70%. The left ventricle has hyperdynamic function. The left ventricle has no regional wall motion abnormalities. The left ventricular internal cavity size was normal in size. There is mild concentric left ventricular hypertrophy. Left ventricular diastolic parameters were normal. Normal left ventricular filling pressure.  Right Ventricle: The right ventricular size is normal. No increase in right ventricular wall thickness. Right ventricular systolic function is normal. There is normal pulmonary artery systolic pressure. The tricuspid regurgitant velocity is 2.01 m/s, and with an assumed right atrial pressure of 3 mmHg, the estimated right ventricular systolic pressure is 19.2 mmHg.  Left Atrium: Left atrial size was normal in size.  Right Atrium: Right atrial size was normal in size.  Pericardium: There is no evidence of pericardial effusion.  Mitral Valve: The mitral valve is normal in structure. Normal mobility of the mitral valve leaflets. Trivial mitral valve regurgitation. No  evidence of mitral valve stenosis.  Tricuspid Valve: The tricuspid valve is normal in structure. Tricuspid valve regurgitation is mild . No evidence of tricuspid stenosis.  Aortic Valve: The aortic valve is normal in structure. Aortic valve regurgitation is not visualized. No aortic stenosis is present.  Pulmonic Valve: The pulmonic valve was normal in structure. Pulmonic valve regurgitation is not visualized. No evidence of pulmonic stenosis.  Aorta: The aortic root is normal in size and structure.  Venous: The inferior vena cava is normal in size with greater than 50% respiratory variability, suggesting right atrial pressure of 3 mmHg.  IAS/Shunts: No atrial level shunt detected by color flow Doppler.   LEFT VENTRICLE PLAX 2D LVIDd:         3.80 cm  Diastology LVIDs:         2.30 cm  LV e' lateral:   8.81 cm/s LV PW:         1.00 cm  LV E/e' lateral: 10.5 LV IVS:        1.20 cm  LV e' medial:    7.29 cm/s LVOT diam:  2.10 cm  LV E/e' medial:  12.7 LV SV:         81 LV SV Index:   41 LVOT Area:     3.46 cm   RIGHT VENTRICLE RV S prime:     12.30 cm/s TAPSE (M-mode): 1.8 cm RVSP:           19.2 mmHg  LEFT ATRIUM             Index       RIGHT ATRIUM           Index LA diam:        3.10 cm 1.56 cm/m  RA Pressure: 3.00 mmHg LA Vol (A2C):   36.7 ml 18.44 ml/m RA Area:     11.80 cm LA Vol (A4C):   32.6 ml 16.38 ml/m RA Volume:   29.40 ml  14.77 ml/m LA Biplane Vol: 34.8 ml 17.48 ml/m AORTIC VALVE LVOT Vmax:   96.50 cm/s LVOT Vmean:  70.600 cm/s LVOT VTI:    0.233 m  AORTA Ao Root diam: 3.60 cm Ao Asc diam:  3.10 cm  MV E velocity: 92.50 cm/s  TRICUSPID VALVE MV A velocity: 76.70 cm/s  TR Peak grad:   16.2 mmHg MV E/A ratio:  1.21        TR Vmax:        201.00 cm/s Estimated RAP:  3.00 mmHg RVSP:           19.2 mmHg  SHUNTS Systemic VTI:  0.23 m Systemic Diam: 2.10 cm  Tobias Alexander MD Electronically signed by Tobias Alexander MD Signature Date/Time:  11/09/2019/3:42:00 PM    Final    MONITORS  CARDIAC EVENT MONITOR 03/23/2016  Narrative  NSR  Occasional PACs  Rare atrial runs       ______________________________________________________________________________________________      Risk Assessment/Calculations:             Physical Exam:   VS:  BP 120/74 (BP Location: Right Arm, Patient Position: Sitting, Cuff Size: Normal)   Pulse (!) 114   Ht 5\' 10"  (1.778 m)   Wt 169 lb (76.7 kg)   SpO2 98%   BMI 24.25 kg/m    Wt Readings from Last 3 Encounters:  08/16/23 169 lb (76.7 kg)  08/15/23 174 lb (78.9 kg)  03/11/23 182 lb 12.8 oz (82.9 kg)    GEN: Well nourished, well developed in no acute distress NECK: No JVD; No carotid bruits CARDIAC: RRR, no murmurs, rubs, gallops RESPIRATORY:  Clear to auscultation without rales, wheezing or rhonchi  ABDOMEN: Soft, non-tender, non-distended EXTREMITIES:  No edema; No deformity   ASSESSMENT AND PLAN: .    Nonobstructive CAD/HLD, LDL less than 70 / Exertional dyspnea - Exertional dyspnea and activity intolerance. Etiology uncertain as also recently treated for COPD flare. Consider etiology COPD, deconditioning, angina. ED visit 08/15/23 normal troponin, EKG no acute ST/T wave changes.  Concern cardiac CTA would not be accurate given his history of tachycardia. As such, plan for cardiac PET as this will allow for ischemia eval as well as assessment of LVEF. We did discuss possible lexiscan myoview should symptoms progress while waiting for PET scheduling. GDMT aspirin, Imdur, Metoprolol, Rosuvastatin.    ILD- Follows with pulmonology.   Tachycardia- Reports no palptiations. Continue Toprol 25mg  daily.    Informed Consent   Shared Decision Making/Informed Consent The risks [chest pain, shortness of breath, cardiac arrhythmias, dizziness, blood pressure fluctuations, myocardial infarction, stroke/transient ischemic attack, nausea, vomiting, allergic reaction,  radiation exposure,  metallic taste sensation and life-threatening complications (estimated to be 1 in 10,000)], benefits (risk stratification, diagnosing coronary artery disease, treatment guidance) and alternatives of a cardiac PET stress test were discussed in detail with Mr. Devan and he agrees to proceed.     Dispo: follow up in 2-3 months  Signed, Alver Sorrow, NP

## 2023-08-17 NOTE — Telephone Encounter (Signed)
I have left a message on their machine to have them return call. No answer at this time.

## 2023-08-17 NOTE — Telephone Encounter (Signed)
Order has been cancelled.

## 2023-08-22 DIAGNOSIS — U071 COVID-19: Secondary | ICD-10-CM | POA: Diagnosis not present

## 2023-08-22 DIAGNOSIS — J440 Chronic obstructive pulmonary disease with acute lower respiratory infection: Secondary | ICD-10-CM | POA: Diagnosis not present

## 2023-08-23 NOTE — Telephone Encounter (Signed)
He needs to be seen in person to figure out next steps/management. Recent CT chest did not show anything in the lungs that would explain the severity of his cough. He's had multiple prednisone course and abx without change. Use benzonatate 1 capsule Three times a day and delsym 2 tsp Twice daily in meantime. Continue all inhalers. Also needs repeat PFT. Thanks.

## 2023-09-12 DIAGNOSIS — H6123 Impacted cerumen, bilateral: Secondary | ICD-10-CM | POA: Diagnosis not present

## 2023-09-24 DIAGNOSIS — I700 Atherosclerosis of aorta: Secondary | ICD-10-CM | POA: Diagnosis not present

## 2023-09-24 DIAGNOSIS — J441 Chronic obstructive pulmonary disease with (acute) exacerbation: Secondary | ICD-10-CM | POA: Diagnosis not present

## 2023-09-24 DIAGNOSIS — I25119 Atherosclerotic heart disease of native coronary artery with unspecified angina pectoris: Secondary | ICD-10-CM | POA: Diagnosis not present

## 2023-09-27 ENCOUNTER — Other Ambulatory Visit: Payer: Self-pay

## 2023-09-27 MED ORDER — METOPROLOL SUCCINATE ER 25 MG PO TB24: 25.0000 mg | ORAL_TABLET | Freq: Every day | ORAL | 3 refills | Status: DC

## 2023-10-03 DIAGNOSIS — I700 Atherosclerosis of aorta: Secondary | ICD-10-CM | POA: Diagnosis not present

## 2023-10-03 DIAGNOSIS — F330 Major depressive disorder, recurrent, mild: Secondary | ICD-10-CM | POA: Diagnosis not present

## 2023-10-03 DIAGNOSIS — I25119 Atherosclerotic heart disease of native coronary artery with unspecified angina pectoris: Secondary | ICD-10-CM | POA: Diagnosis not present

## 2023-10-03 DIAGNOSIS — J441 Chronic obstructive pulmonary disease with (acute) exacerbation: Secondary | ICD-10-CM | POA: Diagnosis not present

## 2023-10-03 DIAGNOSIS — E038 Other specified hypothyroidism: Secondary | ICD-10-CM | POA: Diagnosis not present

## 2023-10-13 DIAGNOSIS — K5900 Constipation, unspecified: Secondary | ICD-10-CM | POA: Diagnosis not present

## 2023-10-17 ENCOUNTER — Ambulatory Visit: Payer: PRIVATE HEALTH INSURANCE | Admitting: Cardiovascular Disease

## 2023-10-17 DIAGNOSIS — F3340 Major depressive disorder, recurrent, in remission, unspecified: Secondary | ICD-10-CM | POA: Diagnosis not present

## 2023-10-18 DIAGNOSIS — R197 Diarrhea, unspecified: Secondary | ICD-10-CM | POA: Diagnosis not present

## 2023-10-18 DIAGNOSIS — R0981 Nasal congestion: Secondary | ICD-10-CM | POA: Diagnosis not present

## 2023-10-23 DIAGNOSIS — I700 Atherosclerosis of aorta: Secondary | ICD-10-CM | POA: Diagnosis not present

## 2023-10-23 DIAGNOSIS — J441 Chronic obstructive pulmonary disease with (acute) exacerbation: Secondary | ICD-10-CM | POA: Diagnosis not present

## 2023-10-23 DIAGNOSIS — I25119 Atherosclerotic heart disease of native coronary artery with unspecified angina pectoris: Secondary | ICD-10-CM | POA: Diagnosis not present

## 2023-11-01 ENCOUNTER — Encounter: Payer: Self-pay | Admitting: Podiatry

## 2023-11-01 ENCOUNTER — Telehealth (HOSPITAL_COMMUNITY): Payer: Self-pay | Admitting: *Deleted

## 2023-11-01 ENCOUNTER — Ambulatory Visit (INDEPENDENT_AMBULATORY_CARE_PROVIDER_SITE_OTHER): Payer: Medicare Other | Admitting: Podiatry

## 2023-11-01 VITALS — Ht 70.0 in | Wt 169.0 lb

## 2023-11-01 DIAGNOSIS — M79675 Pain in left toe(s): Secondary | ICD-10-CM | POA: Diagnosis not present

## 2023-11-01 DIAGNOSIS — M79674 Pain in right toe(s): Secondary | ICD-10-CM

## 2023-11-01 DIAGNOSIS — B351 Tinea unguium: Secondary | ICD-10-CM

## 2023-11-01 NOTE — Telephone Encounter (Signed)
 Reaching out to patient to offer assistance regarding upcoming cardiac imaging study; pt verbalizes understanding of appt date/time, parking situation and where to check in, pre-test NPO status name and call back number provided for further questions should they arise  Richard Brick RN Navigator Cardiac Imaging [facility] [phone] office [phone] cell  Patient aware to avoid caffeine 12 hours prior to his cardiac PET scan.

## 2023-11-02 ENCOUNTER — Encounter (HOSPITAL_BASED_OUTPATIENT_CLINIC_OR_DEPARTMENT_OTHER): Payer: Self-pay

## 2023-11-02 ENCOUNTER — Ambulatory Visit (HOSPITAL_COMMUNITY)
Admission: RE | Admit: 2023-11-02 | Discharge: 2023-11-02 | Disposition: A | Discharge status: Home / Self Care | Payer: PRIVATE HEALTH INSURANCE | Source: Ambulatory Visit | Attending: Family | Admitting: Family

## 2023-11-02 DIAGNOSIS — F330 Major depressive disorder, recurrent, mild: Secondary | ICD-10-CM | POA: Diagnosis not present

## 2023-11-02 DIAGNOSIS — J441 Chronic obstructive pulmonary disease with (acute) exacerbation: Secondary | ICD-10-CM | POA: Diagnosis not present

## 2023-11-02 DIAGNOSIS — R0609 Other forms of dyspnea: Secondary | ICD-10-CM | POA: Diagnosis not present

## 2023-11-02 DIAGNOSIS — E038 Other specified hypothyroidism: Secondary | ICD-10-CM | POA: Diagnosis not present

## 2023-11-02 DIAGNOSIS — I25119 Atherosclerotic heart disease of native coronary artery with unspecified angina pectoris: Secondary | ICD-10-CM | POA: Diagnosis not present

## 2023-11-02 LAB — NM PET CT CARDIAC PERFUSION MULTI W/ABSOLUTE BLOODFLOW
MBFR: 2.48
Nuc Rest EF: 65 %
Nuc Stress EF: 74 %
Rest MBF: 1.05 ml/g/min
Rest Nuclear Isotope Dose: 19.9 mCi
ST Depression (mm): 0 mm
Stress MBF: 2.6 ml/g/min
Stress Nuclear Isotope Dose: 20 mCi

## 2023-11-02 MED ORDER — RUBIDIUM RB82 GENERATOR (RUBYFILL)
19.9800 | PACK | Freq: Once | INTRAVENOUS | Status: AC
  Administered 2023-11-02: 19.98 via INTRAVENOUS

## 2023-11-02 MED ORDER — RUBIDIUM RB82 GENERATOR (RUBYFILL)
19.9300 | PACK | Freq: Once | INTRAVENOUS | Status: AC
  Administered 2023-11-02: 19.93 via INTRAVENOUS

## 2023-11-02 MED ORDER — REGADENOSON 0.4 MG/5ML IV SOLN
INTRAVENOUS | Status: AC
  Filled 2023-11-02: qty 5

## 2023-11-02 MED ORDER — REGADENOSON 0.4 MG/5ML IV SOLN
0.4000 mg | Freq: Once | INTRAVENOUS | Status: AC
  Administered 2023-11-02: 0.4 mg via INTRAVENOUS

## 2023-11-04 ENCOUNTER — Encounter: Payer: Self-pay | Admitting: Cardiovascular Disease

## 2023-11-04 ENCOUNTER — Ambulatory Visit: Attending: Cardiovascular Disease | Admitting: Cardiovascular Disease

## 2023-11-04 VITALS — BP 112/60 | HR 76 | Ht 70.0 in | Wt 161.0 lb

## 2023-11-04 DIAGNOSIS — I2510 Atherosclerotic heart disease of native coronary artery without angina pectoris: Secondary | ICD-10-CM | POA: Insufficient documentation

## 2023-11-04 NOTE — Progress Notes (Signed)
 Cardiology Office Note   Date:  11/04/2023   ID:  MAVIN OSORTO, DOB 11-May-1951, MRN [id number]  PCP:  Olin Bertin, MD  Cardiologist:   Ahmad Alert, MD   No chief complaint on file.  Problem List 1. Chest pain 2. Hiatal hernia 3. GERD 4. Barretts esophagitis    [name] is a 73 y.o. male who presents for evaluation of his CP Would wake him up at night.  Not associated with eating or drinking. Various times of the day  Does not exercise so he does not know if it is exacerbated with exertion .  Would become very fatigued with household chores but not cause chest pain  for the past year.  Gets very tired vaccuming 1/2 of the appt.  Walks the dog occasionally . Used to walk 1 1/2 miles a day - stopped walking this in may , 2015  Pains will ast 25-45 minutes.  sulcrafate has helped the pains quite a bit.    October 27, 2017:  [name] is seen today for urgent work in for episodes of chest discomfort.  Is been seeing Dr. Denece Finger for a GI evaluation and evaluation is been relatively negative.  Has been having lots of chest pain .   The thought it was GERD For the past week or so , has moderate CP  3-4 / 10  Last 15- 20 minutes Associated with nausea  Does not exercise much but pain does not seem to be exertional . In 2017, he had flu shot and pneumonia shot.  Had a pneumonia like illness folowing that , lost 100 lbs  Was not able to walk  Has recovered well.   Still is slightly weak.   Does not have much stamina   I saw him in 2016,    GXT was normal  Echocardiogram in September, 2017 reveals normal left ventricular systolic function.  Ejection fraction is 50 to 55%.  Nov 15, 2017:  [name] is seen for follow-up.  He has a family history of coronary artery disease and has hyperlipidemia. Dashay called after his last visit was complaining of more chest discomfort.  We schedule him for a heart catheterization.   Heart cath revealed mild coronary artery disease  involving the LAD, circumflex vessel and right coronary artery.  The cath site was initially attempted from the right radial artery and was later transitioned to the right femoral artery.  He still has some right arm pain and tenderness.  No further episodes of CP.  No real exercise.  Still has episodes of chest "presence" that then worsens to become a burning  Has had an ECG - no esophageal issues  Is not sleeping well   April 03, 2019:  [name] is seen today for follow-up of his mild coronary artery disease and hyperlipidemia.  Was seen with wife,  Mia Adam.  Was diagnosed with interstitial lung disease  Had covid he thinks ( was sick for 2 months )  Has tested + for antibodies.  Still gets out of breath   Had an episode of tachycardia with minimal exercions  Echo in July, 2020 shows normal LV function .   Jan. 27, 2023 [name] is seen today for his mild CAD, HLD Having some dizziness with exertion and when he stands up .  Had some R sided chest pain   Echocardiogram from May, 2021 reveals normal left ventricular systolic function.  His RV function and RV pressures were normal at that time.  Nov 04, 2023 [name] is here as a work in visit for eval of some chest pain   Cath in April 2019 showed non obstructive CAD  Was having CP Saw Neomi Banks, NP in April Cardiac PET on April 20 ,2205 : Normal , low risk ,  LVEF is 65%  Global CFR was normal at 2.48   He has noticed that his BP has been dropping  Has had dizziness ,  Has lots of orthostasis   BP was 87 / 48 Was dizzy,   Was after breakfast   Is still on Imdur , I've asked him to DC the IMdur     Wt is 161   His wife's health has declined rapidly She used to do the cooking but now is not able to   Has not had a good appetite for a while  Does not eat regularly ,  drinks protein drinks   For the past month, has had severe diarrhea for the month  May eat 1 proper meal each week   I think he needs to see GI  Also  needs to see his primary MD for his chronic diarrhea    Past Medical History:  Diagnosis Date   Arthritis    Bronchitis    history of   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 last stress test 2003   Pneumonia 2009   Primary immune deficiency disorder (HCC)    Tumor cells, benign    lung, left side   Unstable angina (HCC) 10/28/2017    Past Surgical History:  Procedure Laterality Date   APPENDECTOMY  1984   CATARACT EXTRACTION     L and R eye   CHOLECYSTECTOMY  2002   HYDROCELE EXCISION Right 11/04/2016   Procedure: HYDROCELECTOMY ADULT;  Surgeon: Trent Frizzle, MD;  Location: [facility];  Service: Urology;  Laterality: Right;   INGUINAL HERNIA REPAIR Bilateral 11/04/2016   Procedure: HERNIA REPAIR INGUINAL ADULT BILATERAL;  Surgeon: Adalberto Hollow, MD;  Location: [facility];  Service: General;  Laterality: Bilateral;   INSERTION OF MESH Bilateral 11/04/2016   Procedure: INSERTION OF MESH;  Surgeon: Adalberto Hollow, MD;  Location: [facility];  Service: General;  Laterality: Bilateral;   JOINT REPLACEMENT     Lt hip   LEFT HEART CATH AND CORONARY ANGIOGRAPHY N/A 10/31/2017   Procedure: LEFT HEART CATH AND CORONARY ANGIOGRAPHY;  Surgeon: Millicent Ally, MD;  Location: [facility];  Service: Cardiovascular;  Laterality: N/A;   MASTOIDECTOMY  1996   NASAL SEPTUM SURGERY     sinus surgery   PARATHYROIDECTOMY     TOTAL HIP REVISION  06/14/2011   Procedure: TOTAL HIP REVISION;  Surgeon: Ilean Mall;  Location: [facility];  Service: Orthopedics;  Laterality: Left;  Left  Acetabular  Hip Revision     Current Outpatient Medications  Medication Sig Dispense Refill   albuterol  (VENTOLIN  HFA) 108 (90 Base) MCG/ACT inhaler Inhale 2 puffs into the lungs every 6 (six)  hours as needed for wheezing or shortness of breath. 8 g 3   aspirin  81 MG chewable tablet Chew 81 mg by mouth daily.     Budeson-Glycopyrrol-Formoterol (BREZTRI  AEROSPHERE) 160-9-4.8 MCG/ACT AERO Inhale 2 puffs into the lungs in the morning and at bedtime. 3 each 3   celecoxib (CELEBREX) 200 MG capsule Take 200 mg by mouth daily.     cetirizine (ZYRTEC) 10 MG chewable tablet Chew by mouth.     divalproex (DEPAKOTE) 250 MG DR tablet Take 250 mg by mouth 3 (three) times daily. Takes two tablets     Docusate Sodium  (DSS) 100 MG CAPS Take by mouth.     eszopiclone (LUNESTA) 2 MG TABS tablet Take 2 mg by mouth at bedtime as needed for sleep. Take immediately before bedtime     famotidine  (PEPCID ) 20 MG tablet Take 1 tablet (20 mg total) by mouth at bedtime. 30 tablet 3   methylphenidate  (RITALIN ) 10 MG tablet 1 tablet on empty stomach     metoprolol  succinate (TOPROL -XL) 25 MG 24 hr tablet Take 1 tablet (25 mg total) by mouth daily. 90 tablet 3   Multiple Vitamin (MULTIVITAMIN) tablet Take 1 tablet by mouth daily.     nitroGLYCERIN  (NITROSTAT ) 0.4 MG SL tablet Place 1 tablet (0.4 mg total) under the tongue every 5 (five) minutes as needed for chest pain. 25 tablet 3   omeprazole (PRILOSEC) 40 MG capsule Take 40 mg by mouth every morning.     pantoprazole  (PROTONIX ) 40 MG tablet Take 40 mg by mouth daily.     polyethylene glycol (MIRALAX  / GLYCOLAX ) packet Take 17 g by mouth daily as needed (for constipation.).      rosuvastatin  (CRESTOR ) 10 MG tablet TAKE 1 TABLET(10 MG) BY MOUTH DAILY 90 tablet 3   SYNTHROID 25 MCG tablet Take 25 mcg by mouth daily before breakfast.      tamsulosin  (FLOMAX ) 0.4 MG CAPS capsule Take 0.4 mg by mouth daily after breakfast.      No current facility-administered medications  for this visit.    Allergies:   Bupropion , Ciprofloxacin -dexamethasone , Gabapentin , Ciprofloxacin  hcl, and Clonidine hcl    Social History:  The patient  reports that he quit smoking about 38 years ago. His smoking use included cigarettes. He started smoking about 59 years ago. He has a 36 pack-year smoking history. He has never been exposed to tobacco smoke. He has never used smokeless tobacco. He reports current alcohol use. He reports that he does not use drugs.   Family History:  The patient's family history includes Cancer in his brother; Healthy in his daughter; Heart Problems in his father and mother; Lung cancer (age of onset: 23) in his father; Lupus in his daughter; Multiple sclerosis in his son; Neuropathy in his sister.    ROS:   Noted in current history, otherwise review of systems is negative.  Physical Exam: Blood pressure 112/60, pulse 76, height 5\' 10"  (1.778 m), weight 161 lb (73 kg), SpO2 97%.       GEN:  Well nourished, well developed in no acute distress HEENT: Normal NECK: No JVD; No carotid bruits LYMPHATICS: No lymphadenopathy CARDIAC: RRR , no murmurs, rubs, gallops RESPIRATORY:  Clear to auscultation without rales, wheezing or rhonchi  ABDOMEN: Soft, non-tender, non-distended MUSCULOSKELETAL:  No edema; No deformity  SKIN: Warm and dry NEUROLOGIC:  Alert and oriented x 3    EKG:         Recent Labs: 08/15/2023: ALT 34; B Natriuretic Peptide  35.1; BUN 24; Creatinine, Ser 1.11; Hemoglobin 15.1; Platelets 121; Potassium 4.3; Sodium 140    Lipid Panel    Component Value Date/Time   CHOL 134 02/13/2018 0851   TRIG 137 02/13/2018 0851   HDL 41 02/13/2018 0851   CHOLHDL 3.3 02/13/2018 0851   CHOLHDL 3.4 10/29/2017 0352   VLDL 14 10/29/2017 0352   LDLCALC 66 02/13/2018 0851      Wt Readings from Last 3 Encounters:  11/04/23 161 lb (73 kg)  11/01/23 169 lb (76.7 kg)  08/16/23 169 lb (76.7 kg)      Other studies Reviewed: Additional studies/  records that were reviewed today include: . Review of the above records demonstrates:    ASSESSMENT AND PLAN:  1.  Tahycardia:   HR is controlled.    2.         Orthostatic hypotension:  has not been eating well for months.  Does not know how to cook .  Wife previously did all the cooking  Also has chronic , frequent diarrhea   I advised him to contact his primary MD for further advise about his chronic diarrhea.  He is still on the IMdur  . Cardiac PET scan is negative for ischemia . Had an unremarkable cath in 2019.   3.  Noncardiac chest pain :     Cardiac PET scan was normal / low risk  He may see us  as needed.    Current medicines are reviewed at length with the patient today.  The patient does not have concerns regarding medicines.  The following changes have been made:  no change  Labs/ tests ordered today include:   No orders of the defined types were placed in this encounter.       Ahmad Alert, MD  11/04/2023 4:31 PM    [facility] [address] [place], [place], [place]  [address] Phone: [phone]; Fax: [phone]

## 2023-11-04 NOTE — Patient Instructions (Signed)
 Medication Instructions:  Your physician has recommended you make the following change in your medication:  1-STOP Imdur  (Isosorbide  Mononitrate)  *If you need a refill on your cardiac medications before your next appointment, please call your pharmacy*  Lab Work: If you have labs (blood work) drawn today and your tests are completely normal, you will receive your results only by: MyChart Message (if you have MyChart) OR A paper copy in the mail If you have any lab test that is abnormal or we need to change your treatment, we will call you to review the results.  Testing/Procedures: None ordered today.  Follow-Up: At [facility], you and your health needs are our priority.  As part of our continuing mission to provide you with exceptional heart care, our providers are all part of one team.  This team includes your primary Cardiologist (physician) and Advanced Practice Providers or APPs (Physician Assistants and Nurse Practitioners) who all work together to provide you with the care you need, when you need it.  Your next appointment:   As needed  Provider:   Ahmad Alert, MD    We recommend signing up for the patient portal called "MyChart".  Sign up information is provided on this After Visit Summary.  MyChart is used to connect with patients for Virtual Visits (Telemedicine).  Patients are able to view lab/test results, encounter notes, upcoming appointments, etc.  Non-urgent messages can be sent to your provider as well.   To learn more about what you can do with MyChart, go to [email or web address].

## 2023-11-06 NOTE — Progress Notes (Signed)
  Subjective:  Patient ID: [name], male    DOB: 03-30-1951,  MRN: [id number]  73 y.o. male presents painful elongated mycotic toenails 1-5 bilaterally which are tender when wearing enclosed shoe gear. Pain is relieved with periodic professional debridement. Chief Complaint  Patient presents with   Nail Problem    Patient is here for RFC   New problem(s): None   PCP is Olin Bertin, MD.  Allergies  Allergen Reactions   Bupropion  Other (See Comments)    Caused altered mental status Altered mental status Caused altered mental status   Ciprofloxacin -Dexamethasone  Other (See Comments)    Burning in ears when Cipro  ear drops were used unknown Burning in ears when Cipro  ear drops were used   Gabapentin  Other (See Comments)    Caused weakness, dizziness, and forgetfulness  Weakness and dizziness with higher doses. Weakness and dizziness Caused weakness, dizziness, and forgetfulness   Ciprofloxacin  Hcl Other (See Comments)    Burning in ears when drops were used   Clonidine Hcl Other (See Comments)    Review of Systems: Negative except as noted in the HPI.   Objective:  [name] is a pleasant 73 y.o. male in NAD. AAO x 3.  Vascular Examination: Vascular status intact b/l with palpable pedal pulses. CFT immediate b/l. Pedal hair present. No edema. No pain with calf compression b/l. Skin temperature gradient WNL b/l. No varicosities noted. No cyanosis or clubbing noted.  Neurological Examination: Sensation grossly intact b/l with 10 gram monofilament. Vibratory sensation intact b/l.  Dermatological Examination: Pedal skin with normal turgor, texture and tone b/l. No open wounds nor interdigital macerations noted. Toenails 1-5 b/l thick, discolored, elongated with subungual debris and pain on dorsal palpation. No hyperkeratotic lesions noted b/l.   Musculoskeletal Examination: Muscle strength 5/5 to b/l LE.  No pain, crepitus noted b/l. No gross pedal  deformities. Patient ambulates independently without assistive aids.   Radiographs: None  Last A1c:       No data to display           Assessment:   1. Pain due to onychomycosis of toenails of both feet    Plan:  Patient was evaluated and treated. All patient's and/or POA's questions/concerns addressed on today's visit. Mycotic toenails 1-5 debrided in length and girth without incident. Continue soft, supportive shoe gear daily. Report any pedal injuries to medical professional. Call office if there are any quesitons/concerns. -Patient/POA to call should there be question/concern in the interim.  Return in about 3 months (around 01/31/2024).  Luella Sager, DPM      [facility] LOCATION: [address], [place] [address]                   Office [phone]   [facility] LOCATION: [address] [place], [place] [address] Office [phone]

## 2023-11-07 DIAGNOSIS — H6121 Impacted cerumen, right ear: Secondary | ICD-10-CM | POA: Diagnosis not present

## 2023-11-07 DIAGNOSIS — H9212 Otorrhea, left ear: Secondary | ICD-10-CM | POA: Diagnosis not present

## 2023-11-07 DIAGNOSIS — H7293 Unspecified perforation of tympanic membrane, bilateral: Secondary | ICD-10-CM | POA: Diagnosis not present

## 2023-11-07 DIAGNOSIS — H906 Mixed conductive and sensorineural hearing loss, bilateral: Secondary | ICD-10-CM | POA: Diagnosis not present

## 2023-11-16 ENCOUNTER — Other Ambulatory Visit: Payer: Self-pay

## 2023-11-16 DIAGNOSIS — J441 Chronic obstructive pulmonary disease with (acute) exacerbation: Secondary | ICD-10-CM

## 2023-11-18 ENCOUNTER — Encounter: Payer: Self-pay | Admitting: Nurse Practitioner

## 2023-11-18 ENCOUNTER — Ambulatory Visit: Payer: PRIVATE HEALTH INSURANCE | Admitting: Nurse Practitioner

## 2023-11-18 ENCOUNTER — Ambulatory Visit: Payer: PRIVATE HEALTH INSURANCE | Admitting: Internal Medicine

## 2023-11-18 VITALS — BP 102/58 | HR 67 | Ht 71.0 in | Wt 167.8 lb

## 2023-11-18 DIAGNOSIS — J45909 Unspecified asthma, uncomplicated: Secondary | ICD-10-CM

## 2023-11-18 DIAGNOSIS — Z87891 Personal history of nicotine dependence: Secondary | ICD-10-CM | POA: Diagnosis not present

## 2023-11-18 DIAGNOSIS — J441 Chronic obstructive pulmonary disease with (acute) exacerbation: Secondary | ICD-10-CM

## 2023-11-18 DIAGNOSIS — R918 Other nonspecific abnormal finding of lung field: Secondary | ICD-10-CM

## 2023-11-18 DIAGNOSIS — J309 Allergic rhinitis, unspecified: Secondary | ICD-10-CM

## 2023-11-18 DIAGNOSIS — J479 Bronchiectasis, uncomplicated: Secondary | ICD-10-CM

## 2023-11-18 DIAGNOSIS — R634 Abnormal weight loss: Secondary | ICD-10-CM

## 2023-11-18 DIAGNOSIS — J4489 Other specified chronic obstructive pulmonary disease: Secondary | ICD-10-CM

## 2023-11-18 LAB — PULMONARY FUNCTION TEST
DL/VA % pred: 112 %
DL/VA: 4.47 ml/min/mmHg/L
DLCO unc % pred: 77 %
DLCO unc: 21.08 ml/min/mmHg
FEF 25-75 Post: 4.2 L/s
FEF 25-75 Pre: 2.76 L/s
FEF2575-%Change-Post: 51 %
FEF2575-%Pred-Post: 166 %
FEF2575-%Pred-Pre: 109 %
FEV1-%Change-Post: 7 %
FEV1-%Pred-Post: 81 %
FEV1-%Pred-Pre: 75 %
FEV1-Post: 2.78 L
FEV1-Pre: 2.58 L
FEV1FVC-%Change-Post: 6 %
FEV1FVC-%Pred-Pre: 109 %
FEV6-%Change-Post: 2 %
FEV6-%Pred-Post: 74 %
FEV6-%Pred-Pre: 72 %
FEV6-Post: 3.25 L
FEV6-Pre: 3.19 L
FEV6FVC-%Change-Post: 1 %
FEV6FVC-%Pred-Post: 106 %
FEV6FVC-%Pred-Pre: 104 %
FVC-%Change-Post: 0 %
FVC-%Pred-Post: 69 %
FVC-%Pred-Pre: 69 %
FVC-Post: 3.25 L
FVC-Pre: 3.23 L
Post FEV1/FVC ratio: 86 %
Post FEV6/FVC ratio: 100 %
Pre FEV1/FVC ratio: 80 %
Pre FEV6/FVC Ratio: 99 %
RV % pred: 106 %
RV: 2.8 L
TLC % pred: 79 %
TLC: 5.92 L

## 2023-11-18 NOTE — Patient Instructions (Signed)
 Full pft performed today.

## 2023-11-18 NOTE — Assessment & Plan Note (Signed)
 Stable. Continue PRN mucociliary clearance therapies.

## 2023-11-18 NOTE — Assessment & Plan Note (Signed)
 Small, scattered sub 5 mm nodules. Stable on CT chest 08/2023. He is not a candidate for lung cancer screening program as he quit smoking >15 years ago. Will repeat CT chest in February 2026 for surveillance given smoking history.

## 2023-11-18 NOTE — Assessment & Plan Note (Signed)
 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 Last exacerbation 08/2023 requiring steroids/abx. He has been stable over the last several months. Appears compensated on current regimen. If he has any further exacerbations, would recommend either biologic therapy with Dupixent (past eosinophils as high as 600) or inhaled phosphodiesterase inhibitor with Ohtuvayre . Will continue to monitor. Action plan in place. Trigger prevention reviewed. UTD on vaccines.   Patient Instructions  Continue Breztri  2 puffs Twice daily. Brush tongue and rinse mouth afterwards.  Continue Albuterol  inhaler 2 puffs or 3 mL neb every 6 hours as needed for shortness of breath or wheezing. Notify if symptoms persist despite rescue inhaler/neb use. If you start having cough or increased shortness, use neb then flutter valve 2-3 times a day until symptoms improve  Continue flonase  nasal spray 1-2 sprays each nostril daily  Continue Flutter valve 2-3 times a day  Continue pepcid  20 mg daily at bedtime Continue omeprazole 40 mg daily for reflux  Continue Singulair  10 mg At bedtime  Continue Zyrtec 10 mg daily  Continue Mucinex  ER Twice daily for chest congestion  Repeat CT chest in February 2026   Follow up in 4 months with Dr. Bertrum Brodie or Gina Lagos. If symptoms do not improve or worsen, please contact office for sooner follow up or seek emergency care

## 2023-11-18 NOTE — Patient Instructions (Addendum)
 Continue Breztri  2 puffs Twice daily. Brush tongue and rinse mouth afterwards.  Continue Albuterol  inhaler 2 puffs or 3 mL neb every 6 hours as needed for shortness of breath or wheezing. Notify if symptoms persist despite rescue inhaler/neb use. If you start having cough or increased shortness, use neb then flutter valve 2-3 times a day until symptoms improve  Continue flonase  nasal spray 1-2 sprays each nostril daily  Continue Flutter valve 2-3 times a day  Continue pepcid  20 mg daily at bedtime Continue omeprazole 40 mg daily for reflux  Continue Singulair  10 mg At bedtime  Continue Zyrtec 10 mg daily  Continue Mucinex  ER Twice daily for chest congestion  Repeat CT chest in February 2026   Follow up in 4 months with Dr. Bertrum Brodie or Gina Lagos. If symptoms do not improve or worsen, please contact office for sooner follow up or seek emergency care

## 2023-11-18 NOTE — Assessment & Plan Note (Signed)
 Stable on current regimen. No difficulties during allergy season. Continue trigger prevention.

## 2023-11-18 NOTE — Progress Notes (Signed)
 @Patient  ID: [name], male    DOB: 1951/05/25, 73 y.o.   MRN: [id number]  Chief Complaint  Patient presents with   Follow-up    Pft follow up    Referring provider: Olin Bertin, MD  HPI: 73 year old male, former smoker (36-pack-year history) followed for COPD with chronic bronchitis and emphysema and bronchiectasis.  He is a patient of Dr. Mardell Shade and last seen via virtual visit 07/29/2023 by [facility] NP.  Past medical history significant for CAD, mitral valve disorder, cerebrovascular disease, Mobitz 1 second-degree AV block, unstable angina, allergic rhinitis, GERD with Barrett's esophagus, hyperparathyroidism status post parathyroidectomy, dementia, HLD.  TEST/EVENTS:  09/25/2019 PFTs: FVC 65, FEV1 73, ratio 83, DLCO corrected for alveolar volume 76% 03/11/2020 PFTs: FVC 65, FEV1 74, ratio 83, DLCOcor 89 08/04/2020 Spirometry: FVC 61, FEV1 70, ratio 84 08/15/2020 HRCT chest: Scattered atherosclerosis.  There is no LAD.  There is a primarily fat-containing hiatal hernia present.  There are scattered tiny centrilobular nodules at the lung apices.  Occasional stable, small definitively benign pulmonary nodules in the left lower lobe.  Minimal paraseptal emphysema is noted.  There is unchanged, mild tubular bronchiectasis.  No significant air trapping present. 07/31/2021 PFTs: FVC 65, FEV1 77, ratio 87, TLC 65, DLCOcor 80.  Mild obstruction and moderate restrictive airway disease with decreased lung volumes.  No significant BD but did have some mid flow reversibility. 11/10/2021 CT chest with contrast: Atherosclerosis and three-vessel CAD present.  No LAD present.  There is a fat-containing hiatal hernia.  Minimal paraseptal emphysema is present.  There is unchanged mild, tubular bronchiectasis throughout.  Background of very tiny centrilobular pulmonary nodules, most concentrated at the lung apices, most commonly seen in smoking-related bronchiolitis.  More discrete 08/15/2023 CTA chest: No  PE.  Atherosclerosis.  No LAD.  Mild upper lobe predominant emphysema.  Mild bronchiectasis, unchanged.  Scattered small lung nodules, stable and unchanged compared to prior. 11/18/2023 PFT: FVC 69, FEV1 75, ratio 86, TLC 79, DLCO 77  07/31/2021: OV with Dr. Bertrum Brodie.  Experiences frequent episodes of recurrent bronchitis.  Has had 4 exacerbations in the last year, requiring antibiotic and prednisone .  PFTs were overall stable.  Still has a restriction with normal DLCO suggesting neuromuscular defect.  Has had a positive rheumatoid factor in the past but denied any major arthritis pains.  HRCT from a year ago showed minimal paraseptal emphysema.  Symptom score was stable.  Advised changing inhaler therapy to Stiolto.  Discussed alternative of including Daliresp to her regimen or doing triple therapy.  Check A1 AT phenotype MM  11/03/2021: OV with Topher Buenaventura NP for worsening of COPD/bronchitis symptoms.  He reports increased productive cough over the last 5 days with green-yellow sputum production.  He has also had increased shortness of breath upon exertion.  He watches his grandchildren and feels like he has not been able to to do as much with them as he normally can.  Also has some increased generalized weakness.  Has not had any fevers that he is aware of.  He does report some recent decreased appetite.  Does not appear to have lost any weight since I last saw him in November.  Step up to Breztri . Treated with prednisone  taper and doxy course. CT chest w contrast to r/o malignancy or other underlying pulm etiology. Close follow up  11/17/2021: OV with Jolan Mealor NP for follow up. He continues to have a productive, congested cough. Felt like the prednisone  provided minimal relief this time. Has been  having difficulties with this cough for sometime now. CT chest did not show any worsening to his emphysema or BTX and no concern for malignancy. His shortness of breath is overall stable. Doesn't notice much wheezing and has  never really had problems with allergies. He's on omeprazole and pepcid  for GERD and feels as though this is well-controlled. He continues on Breztri , does feel like this helped some with his breathing. He continues on flonase  nasal spray, is using mucinex  Twice daily and flutter valve 2-3 times a day. FeNO elevated 43 ppb with persistent bronchitic type cough- started on singulair  and zyrtec for trigger prevention, and treated with prednisone  taper and cough control regimen.   Over the past week, he's felt weaker and has been getting dizzy when he stands. Denies any syncopal events or palpitations. Does occasional have lower extremity swelling, which is a chronic problem for him. He does take metoprolol  and his blood pressure was noted to be lower than normal today. Advised that he follow up with cardiology to adjust his BP meds.   12/01/2021: OV with Keval Nam NP for follow-up after being treated for exacerbation of COPD/asthma with bronchitis.  Overall he feels like he has improved significantly.  He did have a rough few weeks with sinusitis and conjunctivitis treated by his PCP with amoxicillin .  Feels like he is finally starting to recover from this.  From a pulmonary standpoint, breathing has been stable and he feels like the Breztri  has definitely helped him.  Cough has mostly resolved.  He denies any wheezing, hemoptysis, weight loss, anorexia, lower extremity swelling.  He continues on Singulair , Zyrtec and Flonase  with good control.  He did go see cardiology after our last visit who lowered his metoprolol  and he has not had any dizziness since. Provided with patient assistance for Breztri .   03/01/2022: OV with Estus Krakowski NP for follow up. He has been doing well since his last visit. His breathing has been stable and he is very happy that he has been able to go 3 months without needing abx or steroids. His cough has also significantly improved. He definitely feels like the Breztri  has made a difference;  fortunately, he was able to get this approved by [employer]. Denies any wheezing, chest congestion, orthopnea, PND, leg swelling. He completed pulmonary rehab, which has helped his stamina. He has recently noticed some increased breast tissue on the right side; currently working with his PCP to identify the underlying cause.  He also was curious about previously ordered genetic testing; which upon review of his chart, I don't see that Dr. Bertrum Brodie ordered. He received a kit in the mail for immunodeficiency workup but he's not sure who the company was or where the results were mailed back to. He hasn't heard anything and completed the screening around 4-5 months ago. Encouraged him to check with his PCP and rheumatology.   06/08/2022: Ov with Dr. Waylan Haggard for sick visit. Had been feeling well. Complains of 1 week of worsening DOE, cough with green mucus, wheezing. Use OTC counter delsym , mucinex . CXR. Z pack and prednisone  burst.   07/21/2022: OV with Sueanne Emerald NP. Acute visit. Chronic dry cough. Developed postnasal drainage and productive cough. Rx doxycycline  and prednisone  burst.    11/08/2022: OV with Dr. Gaynell Keeler. Acute cough. CXR without acute process. Prescribed prednisone  and doxycycline .   11/30/2022: OV with Dr. Dione Franks for acute visit. Cough not improved. Spasmodic sudden onset. Frequent throat clearing. Uncontrolled reflux. Chronic rhinitis. Does use flonase . Cough worse with deep breaths.  Having SOB. Breztri  has helped a lot. Oxygen  level always fine at home. Felt cough primarily related to chronic rhinitis. Switch from Zyrtec to different antihistamine. Add astelin  and flonase . Avoid decongestant as tthis is probably causing rebound congestion. Significant irritable larynx syndrome. Trial of speech therapy may help. CT chest ordered as well as COPD with chronic coughing.    07/29/2023: OV with Tagen Brethauer NP for acute visit via virtual visit.  He had started to feel sick last week.  He called into the office on  07/20/2023 with reports of sinus infection, persistent cough and feeling like there was fluid or mucus in his right lung.  Also reported significant voice hoarseness.  He was treated for sinusitis with doxycycline  for 5 days and instructed to use prednisone  40 mg daily for 5 days if symptoms did not improve. He then went to his PCP the following day on 1/16 who advised him to go ahead and start the prednisone .  Lungs noted to be clear. He tells me today he feels like he's getting worse. He initially felt better but has slowly declined again. He's coughing and has some chest discomfort on the right when he coughs. He is producing yellow phlegm. He has had some chills but no fevers. The cough does become paroxysmal at times. Not always easy to get up the phlegm. He does feel like his sinus symptoms are improved. He denies any hemoptysis, palpitations, lightheadedness/dizziness, leg swelling, calf pain. He's using his inhalers and breathing treatments. Doing his flonase , singulair  and allergy pill. Taking his reflux medications; no significant GERD symptoms.    11/18/2023: Today - follow up Patient presents today for follow-up to review PFT results.  His PFT is stable compared to 2023 and even up to 4 years ago.  He has a moderate restrictive defect without formal obstruction.  Does have a reduced FEV1 and diffusion capacity.  Today he tells me that he is doing well.  Feels like the Breztri  definitely helps him.  He has not had a flareup since February.  Not having any significant issues with cough or wheezing.  Activity tolerance is at his baseline.  Does get short winded with longer distances and uphill climbing.  He did lose approximately 15 pounds due to ongoing diarrhea for almost a month.  He is following with GI because of this.  Finally starting to feel better. No abd pain, bloody stools, N/V, fevers.  Not having to use his rescue inhaler often.  Allergies  Allergen Reactions   Bupropion  Other (See  Comments)    Caused altered mental status Altered mental status Caused altered mental status   Ciprofloxacin -Dexamethasone  Other (See Comments)    Burning in ears when Cipro  ear drops were used unknown Burning in ears when Cipro  ear drops were used   Gabapentin  Other (See Comments)    Caused weakness, dizziness, and forgetfulness  Weakness and dizziness with higher doses. Weakness and dizziness Caused weakness, dizziness, and forgetfulness   Ciprofloxacin  Hcl Other (See Comments)    Burning in ears when drops were used   Clonidine Hcl Other (See Comments)    Immunization History  Administered Date(s) Administered   Fluad Trivalent(High Dose 65+) 03/05/2023   H1N1 04/25/2008   Influenza Split 04/25/2008, 04/08/2009, 04/03/2010, 04/02/2011, 04/18/2012, 04/27/2013, 04/05/2015, 03/30/2019   Influenza, High Dose Seasonal PF 03/30/2016   Influenza,inj,Quad PF,6+ Mos 04/02/2016, 03/03/2017, 04/28/2018, 04/04/2019, 03/20/2020   Influenza,inj,quad, With Preservative 04/15/2015   Influenza-Unspecified 03/26/2021   PFIZER(Purple Top)SARS-COV-2 Vaccination 08/12/2019, 09/05/2019, 04/15/2020, 10/13/2020  [employer], [employer] 64yrs & up 03/17/2021   Pneumococcal Conjugate-13 05/08/2015   Pneumococcal Polysaccharide-23 10/24/2007, 03/03/2017   Td 03/26/2004   Tdap 03/13/2013   Zoster, Live 03/13/2013, 05/25/2021    Past Medical History:  Diagnosis Date   Arthritis    Bronchitis    history of   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 last stress  test 2003   Pneumonia 2009   Primary immune deficiency disorder (HCC)    Tumor cells, benign    lung, left side   Unstable angina (HCC) 10/28/2017    Tobacco History: Social History   Tobacco Use  Smoking Status Former   Current packs/day: 0.00   Average packs/day: 1.5 packs/day for 24.0 years (36.0 ttl pk-yrs)   Types: Cigarettes   Start date: 1966   Quit date: 07/05/1985   Years since quitting: 38.3   Passive exposure: Never  Smokeless Tobacco Never   Counseling given: Not Answered   Outpatient Medications Prior to Visit  Medication Sig Dispense Refill   albuterol  (VENTOLIN  HFA) 108 (90 Base) MCG/ACT inhaler Inhale 2 puffs into the lungs every 6 (six) hours as needed for wheezing or shortness of breath. 8 g 3   aspirin  81 MG chewable tablet Chew 81 mg by mouth daily.     Budeson-Glycopyrrol-Formoterol (BREZTRI  AEROSPHERE) 160-9-4.8 MCG/ACT AERO Inhale 2 puffs into the lungs in the morning and at bedtime. 3 each 3   celecoxib (CELEBREX) 200 MG capsule Take 200 mg by mouth daily.     cetirizine (ZYRTEC) 10 MG chewable tablet Chew by mouth.     divalproex (DEPAKOTE) 250 MG DR tablet Take 250 mg by mouth 3 (three) times daily. Takes two tablets     Docusate Sodium  (DSS) 100 MG CAPS Take by mouth.     eszopiclone (LUNESTA) 2 MG TABS tablet Take 2 mg by mouth at bedtime as needed for sleep. Take immediately before bedtime     famotidine  (PEPCID ) 20 MG tablet Take 1 tablet (20 mg total) by mouth at bedtime. 30 tablet 3   methylphenidate  (RITALIN ) 10 MG tablet 1 tablet on empty stomach     metoprolol  succinate (TOPROL -XL) 25 MG 24 hr tablet Take 1 tablet (25 mg total) by mouth daily. 90 tablet 3   Multiple Vitamin (MULTIVITAMIN) tablet Take 1 tablet by mouth daily.     nitroGLYCERIN  (NITROSTAT ) 0.4 MG SL tablet Place 1 tablet (0.4 mg total) under the tongue every 5 (five) minutes as needed for chest pain. 25 tablet 3   omeprazole (PRILOSEC) 40 MG capsule Take 40 mg by mouth every  morning.     pantoprazole  (PROTONIX ) 40 MG tablet Take 40 mg by mouth daily.     polyethylene glycol (MIRALAX  / GLYCOLAX ) packet Take 17 g by mouth daily as needed (for constipation.).      rosuvastatin  (CRESTOR ) 10 MG tablet TAKE 1 TABLET(10 MG) BY MOUTH DAILY 90 tablet 3   SYNTHROID 25 MCG tablet Take  25 mcg by mouth daily before breakfast.      tamsulosin  (FLOMAX ) 0.4 MG CAPS capsule Take 0.4 mg by mouth daily after breakfast.      No facility-administered medications prior to visit.     Review of Systems:   Constitutional: No night sweats, fevers, chills, fatigue, lassitude. +weight loss HEENT: No headaches, difficulty swallowing, tooth/dental problems, or sore throat. No sneezing, itching, ear ache, nasal congestion, or post nasal drip CV:  No chest pain, orthopnea, PND, swelling in lower extremities, anasarca, dizziness, palpitations, syncope Resp: +shortness of breath with exertion (baseline). No cough.  No hemoptysis.  No wheezing. No chest wall deformity GI:  +diarrhea (resolved). No heartburn, indigestion, abdominal pain, nausea, vomiting, change in bowel habits, loss of appetite, bloody stools.  Skin: No rash, lesions, ulcerations MSK:  No joint pain or swelling.  Neuro: No dizziness or lightheadedness.  Psych: No depression or anxiety. Mood stable.     Physical Exam:  BP (!) 102/58 (BP Location: Right Arm, Patient Position: Sitting, Cuff Size: Normal)   Pulse 67   Ht 5\' 11"  (1.803 m)   Wt 167 lb 12.8 oz (76.1 kg)   SpO2 100%   BMI 23.40 kg/m   GEN: Pleasant, interactive, well-kempt; elderly; in no acute distress. HEENT:  Normocephalic and atraumatic. PERRLA. Sclera white. Nasal turbinates pink, moist and patent bilaterally. No rhinorrhea present. Oropharynx pink and moist, without exudate or edema. No lesions, ulcerations, or postnasal drip.  NECK:  Supple w/ fair ROM. Thyroid  symmetrical with no goiter or nodules palpated. No lymphadenopathy.   CV: RRR, no m/r/g, no  peripheral edema. Pulses intact, +2 bilaterally. No cyanosis, pallor or clubbing. PULMONARY:  Unlabored, regular breathing.  Clear bilaterally A&P without wheezes/rhonchi/rales. No accessory muscle use. No dullness to percussion. GI: BS present and normoactive. Soft, non-tender to palpation. No organomegaly or masses detected.  MSK: No erythema, warmth or tenderness. Cap refil <2 sec all extrem.  Neuro: A/Ox3. No focal deficits noted.   Skin: Warm, no lesions or rashe Psych: Normal affect and behavior. Judgement and thought content appropriate.     Lab Results:  CBC    Component Value Date/Time   WBC 5.4 08/15/2023 1122   RBC 4.87 08/15/2023 1122   HGB 15.1 08/15/2023 1122   HGB 15.4 08/17/2019 1517   HCT 45.0 08/15/2023 1122   PLT 121 (L) 08/15/2023 1122   PLT 112 (L) 08/17/2019 1517   MCV 92.4 08/15/2023 1122   MCH 31.0 08/15/2023 1122   MCHC 33.6 08/15/2023 1122   RDW 12.0 08/15/2023 1122   LYMPHSABS 0.9 08/15/2023 1122   MONOABS 0.5 08/15/2023 1122   EOSABS 0.1 08/15/2023 1122   BASOSABS 0.0 08/15/2023 1122    BMET    Component Value Date/Time   NA 140 08/15/2023 1122   NA 142 02/13/2018 0851   K 4.3 08/15/2023 1122   CL 105 08/15/2023 1122   CO2 25 08/15/2023 1122   GLUCOSE 135 (H) 08/15/2023 1122   BUN 24 (H) 08/15/2023 1122   BUN 16 02/13/2018 0851   CREATININE 1.11 08/15/2023 1122   CREATININE 1.01 08/17/2019 1517   CALCIUM  9.2 08/15/2023 1122   CALCIUM  9.0 02/19/2011 1233   GFRNONAA >60 08/15/2023 1122   GFRNONAA >60 08/17/2019 1517   GFRAA >60 08/17/2019 1517    BNP    Component Value Date/Time   BNP 35.1 08/15/2023 1223     Imaging:  NM PET CT CARDIAC PERFUSION MULTI W/ABSOLUTE BLOODFLOW Result Date: 11/02/2023  The study is normal. The study is low risk.   LV perfusion is normal. There is no evidence of ischemia. There is no evidence of infarction.   Rest left ventricular function is normal. Rest EF: 65%. Stress left ventricular function is  normal. Stress EF: 74%. End diastolic cavity size is normal. End systolic cavity size is normal.   Myocardial blood flow was computed to be 1.35ml/g/min at rest and 2.60ml/g/min at stress. Global myocardial blood flow reserve was 2.48 and was normal.   Coronary calcium  was present on the attenuation correction CT images. Moderate coronary calcifications were present. Coronary calcifications were present in the left anterior descending artery, left circumflex artery and right coronary artery distribution(s). Aortic atherosclerosis.   Electronically Signed  By: Gloriann Larger M.D. EXAM: OVER-READ INTERPRETATION  PET-CT CHEST The following report is an over-read performed by radiologist Dr. Kasandra Pain [facility] on 11/02/2023. This over-read does not include interpretation of cardiac or coronary anatomy or pathology. The cardiac PET and cardiac CT interpretation by the cardiologist is to be attached. COMPARISON:  Chest CTA 08/15/2023 FINDINGS: No evidence for lymphadenopathy within the visualized mediastinum or hilar regions. 5 mm left lower lobe pulmonary nodule stable since prior and comparing back to 11/10/2021 compatible with benign etiology. No followup imaging is recommended. Exophytic cyst upper pole right kidney stable back to 2023. No followup imaging is recommended. No suspicious lytic or sclerotic osseous abnormality. IMPRESSION: No acute or clinically significant extracardiac findings. Electronically Signed   By: Donnal Fusi M.D.   On: 11/02/2023 09:41   Administration History     None           Latest Ref Rng & Units 11/18/2023    8:48 AM 07/31/2021   11:52 AM 08/04/2020    8:53 AM 03/11/2020   10:06 AM 09/25/2019    4:01 PM  PFT Results  FVC-Pre L 3.23  P 2.99  2.95  P 3.17  3.15   FVC-Predicted Pre % 69  P 63  61  P 65  65   FVC-Post L 3.25  P 3.10      FVC-Predicted Post % 69  P 65      Pre FEV1/FVC % % 80  P 84  84  P 83  83   Post FEV1/FCV % % 86  P 87       FEV1-Pre L 2.58  P 2.52  2.48  P 2.64  2.63   FEV1-Predicted Pre % 75  P 72  70  P 74  73   FEV1-Post L 2.78  P 2.69      DLCO uncorrected ml/min/mmHg 21.08  P 22.06   24.75  21.79   DLCO UNC% % 77  P 80   89  78   DLCO corrected ml/min/mmHg  22.06   24.75  21.32   DLCO COR %Predicted %  80   89  76   DLVA Predicted % 112  P 126   121  112   TLC L 5.92  P 4.84      TLC % Predicted % 79  P 65      RV % Predicted % 106  P 68        P Preliminary result    Lab Results  Component Value Date   NITRICOXIDE 14 05/26/2016        Assessment & Plan:   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 Last exacerbation 08/2023  requiring steroids/abx. He has been stable over the last several months. Appears compensated on current regimen. If he has any further exacerbations, would recommend either biologic therapy with Dupixent (past eosinophils as high as 600) or inhaled phosphodiesterase inhibitor with Ohtuvayre . Will continue to monitor. Action plan in place. Trigger prevention reviewed. UTD on vaccines.   Patient Instructions  Continue Breztri  2 puffs Twice daily. Brush tongue and rinse mouth afterwards.  Continue Albuterol  inhaler 2 puffs or 3 mL neb every 6 hours as needed for shortness of breath or wheezing. Notify if symptoms persist despite rescue inhaler/neb use. If you start having cough or increased shortness, use neb then flutter valve 2-3 times a day until symptoms improve  Continue flonase  nasal spray 1-2 sprays each nostril daily  Continue Flutter valve 2-3 times a day  Continue pepcid  20 mg daily at bedtime Continue omeprazole 40 mg daily for reflux  Continue Singulair  10 mg At bedtime  Continue Zyrtec 10 mg daily  Continue Mucinex  ER Twice daily for chest congestion  Repeat CT chest in February 2026   Follow up in 4 months with Dr. Bertrum Brodie or Gina Lagos. If symptoms do not improve or worsen, please contact office for sooner follow up or  seek emergency care   Bronchiectasis without complication (HCC) Stable. Continue PRN mucociliary clearance therapies.   Allergic rhinitis Stable on current regimen. No difficulties during allergy season. Continue trigger prevention.   Weight loss Stabilized. Following with GI.   Lung nodules Small, scattered sub 5 mm nodules. Stable on CT chest 08/2023. He is not a candidate for lung cancer screening program as he quit smoking >15 years ago. Will repeat CT chest in February 2026 for surveillance given smoking history.      I spent 32 minutes of dedicated to the care of this patient on the date of this encounter to include pre-visit review of records, face-to-face time with the patient discussing conditions above, post visit ordering of testing, clinical documentation with the electronic health record, making appropriate referrals as documented, and communicating necessary findings to members of the patients care team.  Roetta Clarke, NP 11/18/2023  Pt aware and understands NP's role.

## 2023-11-18 NOTE — Assessment & Plan Note (Signed)
 Stabilized. Following with GI.

## 2023-11-18 NOTE — Progress Notes (Signed)
 Full pft performed today.

## 2023-11-22 DIAGNOSIS — I25119 Atherosclerotic heart disease of native coronary artery with unspecified angina pectoris: Secondary | ICD-10-CM | POA: Diagnosis not present

## 2023-11-22 DIAGNOSIS — I700 Atherosclerosis of aorta: Secondary | ICD-10-CM | POA: Diagnosis not present

## 2023-11-22 DIAGNOSIS — J441 Chronic obstructive pulmonary disease with (acute) exacerbation: Secondary | ICD-10-CM | POA: Diagnosis not present

## 2023-12-03 DIAGNOSIS — F330 Major depressive disorder, recurrent, mild: Secondary | ICD-10-CM | POA: Diagnosis not present

## 2023-12-03 DIAGNOSIS — E038 Other specified hypothyroidism: Secondary | ICD-10-CM | POA: Diagnosis not present

## 2023-12-03 DIAGNOSIS — I25119 Atherosclerotic heart disease of native coronary artery with unspecified angina pectoris: Secondary | ICD-10-CM | POA: Diagnosis not present

## 2023-12-03 DIAGNOSIS — J441 Chronic obstructive pulmonary disease with (acute) exacerbation: Secondary | ICD-10-CM | POA: Diagnosis not present

## 2023-12-03 DIAGNOSIS — I700 Atherosclerosis of aorta: Secondary | ICD-10-CM | POA: Diagnosis not present

## 2023-12-08 ENCOUNTER — Ambulatory Visit: Payer: Self-pay | Admitting: Cardiovascular Disease

## 2023-12-12 DIAGNOSIS — K219 Gastro-esophageal reflux disease without esophagitis: Secondary | ICD-10-CM | POA: Diagnosis not present

## 2023-12-12 DIAGNOSIS — R630 Anorexia: Secondary | ICD-10-CM | POA: Diagnosis not present

## 2023-12-12 DIAGNOSIS — R413 Other amnesia: Secondary | ICD-10-CM | POA: Diagnosis not present

## 2023-12-13 DIAGNOSIS — H9211 Otorrhea, right ear: Secondary | ICD-10-CM | POA: Diagnosis not present

## 2023-12-13 DIAGNOSIS — H906 Mixed conductive and sensorineural hearing loss, bilateral: Secondary | ICD-10-CM | POA: Diagnosis not present

## 2023-12-13 DIAGNOSIS — H7293 Unspecified perforation of tympanic membrane, bilateral: Secondary | ICD-10-CM | POA: Diagnosis not present

## 2023-12-13 DIAGNOSIS — H6123 Impacted cerumen, bilateral: Secondary | ICD-10-CM | POA: Diagnosis not present

## 2023-12-19 DIAGNOSIS — F3340 Major depressive disorder, recurrent, in remission, unspecified: Secondary | ICD-10-CM | POA: Diagnosis not present

## 2023-12-21 ENCOUNTER — Other Ambulatory Visit

## 2023-12-21 ENCOUNTER — Ambulatory Visit (INDEPENDENT_AMBULATORY_CARE_PROVIDER_SITE_OTHER): Admitting: Physician Assistant

## 2023-12-21 ENCOUNTER — Encounter: Payer: Self-pay | Admitting: Physician Assistant

## 2023-12-21 ENCOUNTER — Encounter

## 2023-12-21 VITALS — BP 107/80 | HR 72 | Resp 20 | Ht 70.5 in | Wt 167.0 lb

## 2023-12-21 DIAGNOSIS — R413 Other amnesia: Secondary | ICD-10-CM

## 2023-12-21 NOTE — Patient Instructions (Addendum)
 It was a pleasure to see you today at our office.   Recommendations:  Neurocognitive evaluation at our office   MRI of the brain, the radiology office will call you to arrange you appointment  [phone] Check labs today  [address] Follow up after results of the neurocognitive testing   For psychiatric meds, mood meds: Please have your primary care physician manage these medications.  If you have any severe symptoms of a stroke, or other severe issues such as confusion,severe chills or fever, etc call 911 or go to the ER as you may need to be evaluated further    For assessment of decision of mental capacity and competency:  Call Dr. Laverne Potter, geriatric psychiatrist at [phone]  Counseling regarding caregiver distress, including caregiver depression, anxiety and issues regarding community resources, adult day care programs, adult living facilities, or memory care questions:  please contact your  Primary Doctor's Social Worker    FOR Memory  decline, memory medications: Call our office [phone]    [email or web address]   RECOMMENDATIONS FOR ALL PATIENTS WITH MEMORY PROBLEMS: 1. Continue to exercise (Recommend 30 minutes of walking everyday, or 3 hours every week) 2. Increase social interactions - continue going to [place] and enjoy social gatherings with friends and family 3. Eat healthy, avoid fried foods and eat more fruits and vegetables 4. Maintain adequate blood pressure, blood sugar, and blood cholesterol level. Reducing the risk of stroke and cardiovascular disease also helps promoting better memory. 5. Avoid stressful situations. Live a simple life and avoid aggravations. Organize your time and prepare for the next day in anticipation. 6. Sleep well, avoid any interruptions of sleep and avoid any distractions in the bedroom that may interfere with adequate sleep quality 7. Avoid sugar, avoid sweets as there is a  strong link between excessive sugar intake, diabetes, and cognitive impairment We discussed the Mediterranean diet, which has been shown to help patients reduce the risk of progressive memory disorders and reduces cardiovascular risk. This includes eating fish, eat fruits and green leafy vegetables, nuts like almonds and hazelnuts, walnuts, and also use olive oil. Avoid fast foods and fried foods as much as possible. Avoid sweets and sugar as sugar use has been linked to worsening of memory function.  There is always a concern of gradual progression of memory problems. If this is the case, then we may need to adjust level of care according to patient needs. Support, both to the patient and caregiver, should then be put into place.      You have been referred for a neuropsychological evaluation (i.e., evaluation of memory and thinking abilities). Please bring someone with you to this appointment if possible, as it is helpful for the doctor to hear from both you and another adult who knows you well. Please bring eyeglasses and hearing aids if you wear them.    The evaluation will take approximately 3 hours and has two parts:   The first part is a clinical interview with the neuropsychologist (Dr. Kitty Perkins or Dr. Donavon Fudge). During the interview, the neuropsychologist will speak with you and the individual you brought to the appointment.    The second part of the evaluation is testing with the doctor's technician Bernabe Brew or Burdette Carolin). During the testing, the technician will ask you to remember different types of material, solve problems, and answer some questionnaires. Your family member will not be present for this portion of the evaluation.   Please note: We must reserve several hours of the neuropsychologist's  time and the psychometrician's time for your evaluation appointment. As such, there is a No-Show fee of $100. If you are unable to attend any of your appointments, please contact our office as soon as  possible to reschedule.      DRIVING: Regarding driving, in patients with progressive memory problems, driving will be impaired. We advise to have someone else do the driving if trouble finding directions or if minor accidents are reported. Independent driving assessment is available to determine safety of driving.   If you are interested in the driving assessment, you can contact the following:  [facility] in [place] [phone]  [facility] [phone]  [facility] [phone]  [facility] [phone] or [phone]   FALL PRECAUTIONS: Be cautious when walking. Scan the area for obstacles that may increase the risk of trips and falls. When getting up in the mornings, sit up at the edge of the bed for a few minutes before getting out of bed. Consider elevating the bed at the head end to avoid drop of blood pressure when getting up. Walk always in a well-lit room (use night lights in the walls). Avoid area rugs or power cords from appliances in the middle of the walkways. Use a walker or a cane if necessary and consider physical therapy for balance exercise. Get your eyesight checked regularly.  FINANCIAL OVERSIGHT: Supervision, especially oversight when making financial decisions or transactions is also recommended.  HOME SAFETY: Consider the safety of the kitchen when operating appliances like stoves, microwave oven, and blender. Consider having supervision and share cooking responsibilities until no longer able to participate in those. Accidents with firearms and other hazards in the house should be identified and addressed as well.   ABILITY TO BE LEFT ALONE: If patient is unable to contact 911 operator, consider using LifeLine, or when the need is there, arrange for someone to stay with patients. Smoking is a fire hazard, consider supervision or cessation. Risk of wandering should be assessed by caregiver and if detected at any  point, supervision and safe proof recommendations should be instituted.  MEDICATION SUPERVISION: Inability to self-administer medication needs to be constantly addressed. Implement a mechanism to ensure safe administration of the medications.      Mediterranean Diet A Mediterranean diet refers to food and lifestyle choices that are based on the traditions of countries located on the [employer] [place]. This way of eating has been shown to help prevent certain conditions and improve outcomes for people who have chronic diseases, like kidney disease and heart disease. What are tips for following this plan? Lifestyle  Cook and eat meals together with your family, when possible. Drink enough fluid to keep your urine clear or pale yellow. Be physically active every day. This includes: Aerobic exercise like running or swimming. Leisure activities like gardening, walking, or housework. Get 7-8 hours of sleep each night. If recommended by your health care provider, drink red wine in moderation. This means 1 glass a day for nonpregnant women and 2 glasses a day for men. A glass of wine equals 5 oz (150 mL). Reading food labels  Check the serving size of packaged foods. For foods such as rice and pasta, the serving size refers to the amount of cooked product, not dry. Check the total fat in packaged foods. Avoid foods that have saturated fat or trans fats. Check the ingredients list for added sugars, such as corn syrup. Shopping  At the grocery store, buy most of your food  from the areas near the walls of the store. This includes: Fresh fruits and vegetables (produce). Grains, beans, nuts, and seeds. Some of these may be available in unpackaged forms or large amounts (in bulk). Fresh seafood. Poultry and eggs. Low-fat dairy products. Buy whole ingredients instead of prepackaged foods. Buy fresh fruits and vegetables in-season from local farmers markets. Buy frozen fruits and vegetables in  resealable bags. If you do not have access to quality fresh seafood, buy precooked frozen shrimp or canned fish, such as tuna, salmon, or sardines. Buy small amounts of raw or cooked vegetables, salads, or olives from the deli or salad bar at your store. Stock your pantry so you always have certain foods on hand, such as olive oil, canned tuna, canned tomatoes, rice, pasta, and beans. Cooking  Cook foods with extra-virgin olive oil instead of using butter or other vegetable oils. Have meat as a side dish, and have vegetables or grains as your main dish. This means having meat in small portions or adding small amounts of meat to foods like pasta or stew. Use beans or vegetables instead of meat in common dishes like chili or lasagna. Experiment with different cooking methods. Try roasting or broiling vegetables instead of steaming or sauteing them. Add frozen vegetables to soups, stews, pasta, or rice. Add nuts or seeds for added healthy fat at each meal. You can add these to yogurt, salads, or vegetable dishes. Marinate fish or vegetables using olive oil, lemon juice, garlic, and fresh herbs. Meal planning  Plan to eat 1 vegetarian meal one day each week. Try to work up to 2 vegetarian meals, if possible. Eat seafood 2 or more times a week. Have healthy snacks readily available, such as: Vegetable sticks with hummus. Greek yogurt. Fruit and nut trail mix. Eat balanced meals throughout the week. This includes: Fruit: 2-3 servings a day Vegetables: 4-5 servings a day Low-fat dairy: 2 servings a day Fish, poultry, or lean meat: 1 serving a day Beans and legumes: 2 or more servings a week Nuts and seeds: 1-2 servings a day Whole grains: 6-8 servings a day Extra-virgin olive oil: 3-4 servings a day Limit red meat and sweets to only a few servings a month What are my food choices? Mediterranean diet Recommended Grains: Whole-grain pasta. Brown rice. Bulgar wheat. Polenta. Couscous.  Whole-wheat bread. Dwyane Glad. Vegetables: Artichokes. Beets. Broccoli. Cabbage. Carrots. Eggplant. Green beans. Chard. Kale. Spinach. Onions. Leeks. Peas. Squash. Tomatoes. Peppers. Radishes. Fruits: Apples. Apricots. Avocado. Berries. Bananas. Cherries. Dates. Figs. Grapes. Lemons. Melon. Oranges. Peaches. Plums. Pomegranate. Meats and other protein foods: Beans. Almonds. Sunflower seeds. Pine nuts. Peanuts. Cod. Salmon. Scallops. Shrimp. Tuna. Tilapia. Clams. Oysters. Eggs. Dairy: Low-fat milk. Cheese. Greek yogurt. Beverages: Water. Red wine. Herbal tea. Fats and oils: Extra virgin olive oil. Avocado oil. Grape seed oil. Sweets and desserts: [place] yogurt with honey. Baked apples. Poached pears. Trail mix. Seasoning and other foods: Basil. Cilantro. Coriander. Cumin. Mint. Parsley. Sage. Rosemary. Tarragon. Garlic. Oregano. Thyme. Pepper. Balsalmic vinegar. Tahini. Hummus. Tomato sauce. Olives. Mushrooms. Limit these Grains: Prepackaged pasta or rice dishes. Prepackaged cereal with added sugar. Vegetables: Deep fried potatoes (french fries). Fruits: Fruit canned in syrup. Meats and other protein foods: Beef. Pork. Lamb. Poultry with skin. Hot dogs. Helene Loader. Dairy: Ice cream. Sour cream. Whole milk. Beverages: Juice. Sugar-sweetened soft drinks. Beer. Liquor and spirits. Fats and oils: Butter. Canola oil. Vegetable oil. Beef fat (tallow). Lard. Sweets and desserts: Cookies. Cakes. Pies. Candy. Seasoning and other foods: Mayonnaise. Premade  sauces and marinades. The items listed may not be a complete list. Talk with your dietitian about what dietary choices are right for you. Summary The Mediterranean diet includes both food and lifestyle choices. Eat a variety of fresh fruits and vegetables, beans, nuts, seeds, and whole grains. Limit the amount of red meat and sweets that you eat. Talk with your health care provider about whether it is safe for you to drink red wine in moderation. This  means 1 glass a day for nonpregnant women and 2 glasses a day for men. A glass of wine equals 5 oz (150 mL). This information is not intended to replace advice given to you by your health care provider. Make sure you discuss any questions you have with your health care provider. Document Released: 02/12/2016 Document Revised: 03/16/2016 Document Reviewed: 02/12/2016 Elsevier Interactive Patient Education  2017 [employer].
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
## 2023-12-22 ENCOUNTER — Ambulatory Visit: Payer: Self-pay

## 2023-12-22 LAB — VITAMIN B12: Vitamin B-12: 708 pg/mL (ref 200–1100)

## 2023-12-22 LAB — TSH: TSH: 5.4 m[IU]/L — ABNORMAL HIGH (ref 0.40–4.50)

## 2023-12-24 ENCOUNTER — Ambulatory Visit
Admission: RE | Admit: 2023-12-24 | Discharge: 2023-12-24 | Disposition: A | Discharge status: Home / Self Care | Payer: PRIVATE HEALTH INSURANCE | Source: Ambulatory Visit | Attending: Physician Assistant | Admitting: Physician Assistant

## 2023-12-24 DIAGNOSIS — R413 Other amnesia: Secondary | ICD-10-CM | POA: Diagnosis not present

## 2023-12-24 DIAGNOSIS — G319 Degenerative disease of nervous system, unspecified: Secondary | ICD-10-CM | POA: Diagnosis not present

## 2023-12-24 DIAGNOSIS — I6782 Cerebral ischemia: Secondary | ICD-10-CM | POA: Diagnosis not present

## 2023-12-26 ENCOUNTER — Encounter: Payer: Self-pay | Admitting: Cardiovascular Disease

## 2023-12-26 DIAGNOSIS — R7303 Prediabetes: Secondary | ICD-10-CM | POA: Diagnosis not present

## 2023-12-26 DIAGNOSIS — F988 Other specified behavioral and emotional disorders with onset usually occurring in childhood and adolescence: Secondary | ICD-10-CM | POA: Diagnosis not present

## 2023-12-26 DIAGNOSIS — I25119 Atherosclerotic heart disease of native coronary artery with unspecified angina pectoris: Secondary | ICD-10-CM | POA: Diagnosis not present

## 2023-12-26 DIAGNOSIS — I679 Cerebrovascular disease, unspecified: Secondary | ICD-10-CM | POA: Diagnosis not present

## 2023-12-26 DIAGNOSIS — I2510 Atherosclerotic heart disease of native coronary artery without angina pectoris: Secondary | ICD-10-CM | POA: Diagnosis not present

## 2023-12-26 DIAGNOSIS — I700 Atherosclerosis of aorta: Secondary | ICD-10-CM | POA: Diagnosis not present

## 2023-12-26 DIAGNOSIS — D696 Thrombocytopenia, unspecified: Secondary | ICD-10-CM | POA: Diagnosis not present

## 2023-12-26 DIAGNOSIS — J441 Chronic obstructive pulmonary disease with (acute) exacerbation: Secondary | ICD-10-CM | POA: Diagnosis not present

## 2023-12-26 MED ORDER — METOPROLOL SUCCINATE ER 25 MG PO TB24: 25.0000 mg | ORAL_TABLET | Freq: Every day | ORAL | 3 refills | Status: AC

## 2023-12-28 ENCOUNTER — Other Ambulatory Visit: Payer: PRIVATE HEALTH INSURANCE

## 2023-12-28 DIAGNOSIS — N401 Enlarged prostate with lower urinary tract symptoms: Secondary | ICD-10-CM | POA: Diagnosis not present

## 2023-12-28 DIAGNOSIS — R972 Elevated prostate specific antigen [PSA]: Secondary | ICD-10-CM | POA: Diagnosis not present

## 2023-12-28 DIAGNOSIS — N320 Bladder-neck obstruction: Secondary | ICD-10-CM | POA: Diagnosis not present

## 2023-12-28 DIAGNOSIS — R3911 Hesitancy of micturition: Secondary | ICD-10-CM | POA: Diagnosis not present

## 2023-12-28 DIAGNOSIS — R3912 Poor urinary stream: Secondary | ICD-10-CM | POA: Diagnosis not present

## 2024-01-02 DIAGNOSIS — J441 Chronic obstructive pulmonary disease with (acute) exacerbation: Secondary | ICD-10-CM | POA: Diagnosis not present

## 2024-01-02 DIAGNOSIS — I25119 Atherosclerotic heart disease of native coronary artery with unspecified angina pectoris: Secondary | ICD-10-CM | POA: Diagnosis not present

## 2024-01-02 DIAGNOSIS — I700 Atherosclerosis of aorta: Secondary | ICD-10-CM | POA: Diagnosis not present

## 2024-01-10 ENCOUNTER — Ambulatory Visit (INDEPENDENT_AMBULATORY_CARE_PROVIDER_SITE_OTHER): Payer: Medicare Other | Admitting: Podiatry

## 2024-01-10 ENCOUNTER — Encounter: Payer: Self-pay | Admitting: Podiatry

## 2024-01-10 DIAGNOSIS — B351 Tinea unguium: Secondary | ICD-10-CM | POA: Diagnosis not present

## 2024-01-10 DIAGNOSIS — M79674 Pain in right toe(s): Secondary | ICD-10-CM

## 2024-01-10 DIAGNOSIS — M79675 Pain in left toe(s): Secondary | ICD-10-CM | POA: Diagnosis not present

## 2024-01-10 NOTE — Progress Notes (Signed)
  Subjective:  Patient ID: [name], male    DOB: May 19, 1951,  MRN: [id number]  73 y.o. male presents painful, elongated thickened toenails x 10 which are symptomatic when wearing enclosed shoe gear. This interferes with his/her daily activities. He and his wife are in the process of moving to a smaller home. Chief Complaint  Patient presents with   RFC    RFC non diabetic. 0 pain. Toenail trim.   New problem(s): None   PCP is Verena Mems, MD.  Allergies  Allergen Reactions   Bupropion  Other (See Comments)    Caused altered mental status Altered mental status Caused altered mental status   Ciprofloxacin -Dexamethasone  Other (See Comments)    Burning in ears when Cipro  ear drops were used unknown Burning in ears when Cipro  ear drops were used   Gabapentin  Other (See Comments)    Caused weakness, dizziness, and forgetfulness  Weakness and dizziness with higher doses. Weakness and dizziness Caused weakness, dizziness, and forgetfulness   Ciprofloxacin  Hcl Other (See Comments)    Burning in ears when drops were used   Clonidine Hcl Other (See Comments)    Review of Systems: Negative except as noted in the HPI.   Objective:  [name] is a pleasant 73 y.o. male WD, WN in NAD. AAO x 3.  Neurovascular status intact/unchanged bilateral lower extremities.  Dermatological Examination: Pedal skin with normal turgor, texture and tone b/l. No open wounds nor interdigital macerations noted. Toenails 1-5 b/l thick, discolored, elongated with subungual debris and pain on dorsal palpation. No hyperkeratotic lesions noted b/l.   Musculoskeletal Examination: Muscle strength 5/5 to b/l LE.  No pain, crepitus noted b/l. No gross pedal deformities. Patient ambulates independently without assistive aids.   Radiographs: None  Last A1c:       No data to display           Assessment:   1. Pain due to onychomycosis of toenails of both feet    Plan:  Consent given  for treatment. Patient examined. All patient's and/or POA's questions/concerns addressed on today's visit. Toenails 1-5 debrided in length and girth without incident. Continue soft, supportive shoe gear daily. Report any pedal injuries to medical professional. Call office if there are any questions/concerns. -Patient/POA to call should there be question/concern in the interim.  Return in about 3 months (around 04/11/2024).  Delon LITTIE Merlin, DPM       LOCATION: [address], [place] [address]                   Office [phone]   [facility] LOCATION: [address] [place], [place] [address] Office [phone]

## 2024-01-11 ENCOUNTER — Encounter: Payer: Self-pay | Admitting: Physician Assistant

## 2024-01-13 ENCOUNTER — Other Ambulatory Visit: Payer: Self-pay

## 2024-01-13 DIAGNOSIS — I700 Atherosclerosis of aorta: Secondary | ICD-10-CM

## 2024-01-13 DIAGNOSIS — I2510 Atherosclerotic heart disease of native coronary artery without angina pectoris: Secondary | ICD-10-CM

## 2024-01-19 ENCOUNTER — Ambulatory Visit: Payer: Self-pay

## 2024-01-19 ENCOUNTER — Ambulatory Visit (INDEPENDENT_AMBULATORY_CARE_PROVIDER_SITE_OTHER): Payer: PRIVATE HEALTH INSURANCE | Admitting: Psychology

## 2024-01-19 DIAGNOSIS — R4189 Other symptoms and signs involving cognitive functions and awareness: Secondary | ICD-10-CM

## 2024-01-19 DIAGNOSIS — R419 Unspecified symptoms and signs involving cognitive functions and awareness: Secondary | ICD-10-CM

## 2024-01-19 DIAGNOSIS — F418 Other specified anxiety disorders: Secondary | ICD-10-CM | POA: Diagnosis not present

## 2024-01-19 NOTE — Progress Notes (Signed)
   Psychometrician Note   Cognitive testing was administered to [name] by Luke Pitcher, B.S. (psychometrist) under the supervision of Dr. Renda Beckwith, Psy.D., licensed psychologist on 01/19/2024. [name] did not appear overtly distressed by the testing session per behavioral observation or responses across self-report questionnaires. Rest breaks were offered.    The battery of tests administered was selected by Dr. Renda Beckwith, Psy.D. with consideration to [name] current level of functioning, the nature of his symptoms, emotional and behavioral responses during interview, level of literacy, observed level of motivation/effort, and the nature of the referral question. This battery was communicated to the psychometrist. Communication between Dr. Renda Beckwith, Psy.D. and the psychometrist was ongoing throughout the evaluation and Dr. Renda Beckwith, Psy.D. was immediately accessible at all times. Dr. Renda Beckwith, Psy.D. provided supervision to the psychometrist on the date of this service to the extent necessary to assure the quality of all services provided.    [name] will return within approximately 1-2 weeks for an interactive feedback session with Dr. Beckwith at which time his test performances, clinical impressions, and treatment recommendations will be reviewed in detail. [name] understands he can contact our office should he require our assistance before this time.  A total of 110 minutes of billable time were spent face-to-face with [name] by the psychometrist. This includes both test administration and scoring time. Billing for these services is reflected in the clinical report generated by Dr. Renda Beckwith, Psy.D.  This note reflects time spent with the psychometrician and does not include test scores or any clinical interpretations made by Dr. Beckwith. The full report will follow in a separate note.

## 2024-01-19 NOTE — Progress Notes (Signed)
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 preparation. A technician, Luke Pitcher, B.S., provided testing and scoring assistance for 110 minutes.  Psychiatric Diagnostic Evaluation Services (Professional): 09208 x 1 Neuropsychological Testing Evaluation Services (Professional): 03867 x 1 Neuropsychological Testing Evaluation Services (Professional): 03866 x 2 Neuropsychological Test Administration and Scoring (Technician): [phone] x 1 Neuropsychological Test Administration and Scoring (Technician): [phone] x 3  This report was generated using voice recognition software. While this document has been carefully reviewed, transcription errors may be present. I apologize in advance for any inconvenience. Please contact me if further clarification is needed.            Renda Beckwith, Psy.D.             Neuropsychologist

## 2024-01-23 DIAGNOSIS — D696 Thrombocytopenia, unspecified: Secondary | ICD-10-CM | POA: Diagnosis not present

## 2024-01-23 DIAGNOSIS — R7303 Prediabetes: Secondary | ICD-10-CM | POA: Diagnosis not present

## 2024-01-23 DIAGNOSIS — E039 Hypothyroidism, unspecified: Secondary | ICD-10-CM | POA: Diagnosis not present

## 2024-01-25 DIAGNOSIS — F3340 Major depressive disorder, recurrent, in remission, unspecified: Secondary | ICD-10-CM | POA: Diagnosis not present

## 2024-02-02 ENCOUNTER — Encounter: Payer: PRIVATE HEALTH INSURANCE | Admitting: Psychology

## 2024-02-02 DIAGNOSIS — J441 Chronic obstructive pulmonary disease with (acute) exacerbation: Secondary | ICD-10-CM | POA: Diagnosis not present

## 2024-02-02 DIAGNOSIS — E038 Other specified hypothyroidism: Secondary | ICD-10-CM | POA: Diagnosis not present

## 2024-02-02 DIAGNOSIS — I25119 Atherosclerotic heart disease of native coronary artery with unspecified angina pectoris: Secondary | ICD-10-CM | POA: Diagnosis not present

## 2024-02-02 DIAGNOSIS — I700 Atherosclerosis of aorta: Secondary | ICD-10-CM | POA: Diagnosis not present

## 2024-02-02 DIAGNOSIS — F330 Major depressive disorder, recurrent, mild: Secondary | ICD-10-CM | POA: Diagnosis not present

## 2024-02-06 ENCOUNTER — Ambulatory Visit (INDEPENDENT_AMBULATORY_CARE_PROVIDER_SITE_OTHER): Payer: PRIVATE HEALTH INSURANCE | Admitting: Psychology

## 2024-02-06 DIAGNOSIS — F418 Other specified anxiety disorders: Secondary | ICD-10-CM | POA: Diagnosis not present

## 2024-02-06 DIAGNOSIS — R419 Unspecified symptoms and signs involving cognitive functions and awareness: Secondary | ICD-10-CM | POA: Diagnosis not present

## 2024-02-06 NOTE — Progress Notes (Signed)
   NEUROPSYCHOLOGY FEEDBACK SESSION [place]. [facility]   Department of Neurology  Date of Feedback Session: 02/06/2024  REASON FOR REFERRAL   [name] is a 73 year old, right-handed, White male with 18 years of formal education. He was referred for neuropsychological evaluation by Camie Sevin, PA-C, to assess current neurocognitive functioning, document potential cognitive deficits, and assist with treatment planning. He previously underwent neuropsychological evaluation with Darice Muslim, Psy.D., at [facility] on 04/29/2017. The report from this evaluation was available for review; however, no specific scores were provided. Consequently, only very general comparisons can be made today, as the absence of data significantly limits the ability to perform a thorough analysis. Records also indicate a follow-up neuropsychological evaluation in 2020, with results summarized by an external neurologist. When questioned about this evaluation, the patient and his wife denied any recollection of a second assessment, despite documentation suggesting it occurred. Nonetheless, aside from a brief summary, no detailed results from that second evaluation were available for comparison.   FEEDBACK   Patient completed a comprehensive neuropsychological evaluation on 01/19/2024. Please refer to that encounter for the full report and recommendations. Briefly, results indicated normal cognitive functioning, with no evidence of a neurocognitive disorder at this time. His overall profile reflects healthy cognitive aging, with well-preserved functional abilities. Based on my comparison with the last evaluation, it appears his scores have improved, given that the areas previously identified as impaired are no longer impaired today. Etiology of subjective cognitive concerns is most likely multifactorial, including normal aging, ADHD, cerebrovascular changes, and ongoing mood  disturbance. Most encouragingly, despite some uncertainty on his last MRI as to whether the findings represent an infarct or artifact, his current performance does not show evidence of cognitive impairment attributable to that event.  Today, the patient was accompanied by his wife. They were provided verbal feedback regarding the findings and impression during this visit, and their questions were answered. A copy of the report was provided at the conclusion of the visit.  DISPOSITION   Patient will follow up with the referring provider, Ms. Wertman. No follow-up neuropsychological testing was scheduled at this time. Please feel free to refer the patient for repeated evaluation if he shows a significant change in neurocognitive status.  SERVICE   This feedback session was conducted by Renda Beckwith, Psy.D. One unit of 03867 (35 minutes) was billed for Dr. Beckwith' time spent in preparing, conducting, and documenting the current feedback session.  This report was generated using voice recognition software. While this document has been carefully reviewed, transcription errors may be present. I apologize in advance for any inconvenience. Please contact me if further clarification is needed.

## 2024-02-08 DIAGNOSIS — M7989 Other specified soft tissue disorders: Secondary | ICD-10-CM | POA: Diagnosis not present

## 2024-02-08 DIAGNOSIS — D692 Other nonthrombocytopenic purpura: Secondary | ICD-10-CM | POA: Diagnosis not present

## 2024-02-13 ENCOUNTER — Encounter: Payer: Self-pay | Admitting: Physician Assistant

## 2024-02-27 DIAGNOSIS — D696 Thrombocytopenia, unspecified: Secondary | ICD-10-CM | POA: Diagnosis not present

## 2024-02-27 DIAGNOSIS — E039 Hypothyroidism, unspecified: Secondary | ICD-10-CM | POA: Diagnosis not present

## 2024-03-04 DIAGNOSIS — F330 Major depressive disorder, recurrent, mild: Secondary | ICD-10-CM | POA: Diagnosis not present

## 2024-03-04 DIAGNOSIS — E038 Other specified hypothyroidism: Secondary | ICD-10-CM | POA: Diagnosis not present

## 2024-03-04 DIAGNOSIS — I25119 Atherosclerotic heart disease of native coronary artery with unspecified angina pectoris: Secondary | ICD-10-CM | POA: Diagnosis not present

## 2024-03-04 DIAGNOSIS — I700 Atherosclerosis of aorta: Secondary | ICD-10-CM | POA: Diagnosis not present

## 2024-03-04 DIAGNOSIS — J441 Chronic obstructive pulmonary disease with (acute) exacerbation: Secondary | ICD-10-CM | POA: Diagnosis not present

## 2024-03-07 DIAGNOSIS — R0609 Other forms of dyspnea: Secondary | ICD-10-CM | POA: Diagnosis not present

## 2024-03-07 DIAGNOSIS — R001 Bradycardia, unspecified: Secondary | ICD-10-CM | POA: Diagnosis not present

## 2024-03-07 DIAGNOSIS — I2510 Atherosclerotic heart disease of native coronary artery without angina pectoris: Secondary | ICD-10-CM | POA: Diagnosis not present

## 2024-03-07 DIAGNOSIS — D696 Thrombocytopenia, unspecified: Secondary | ICD-10-CM | POA: Diagnosis not present

## 2024-03-08 DIAGNOSIS — R0609 Other forms of dyspnea: Secondary | ICD-10-CM | POA: Diagnosis not present

## 2024-03-08 DIAGNOSIS — D696 Thrombocytopenia, unspecified: Secondary | ICD-10-CM | POA: Diagnosis not present

## 2024-03-12 DIAGNOSIS — R5383 Other fatigue: Secondary | ICD-10-CM | POA: Diagnosis not present

## 2024-03-12 DIAGNOSIS — D696 Thrombocytopenia, unspecified: Secondary | ICD-10-CM | POA: Diagnosis not present

## 2024-03-12 DIAGNOSIS — J449 Chronic obstructive pulmonary disease, unspecified: Secondary | ICD-10-CM | POA: Diagnosis not present

## 2024-03-26 ENCOUNTER — Inpatient Hospital Stay: Payer: PRIVATE HEALTH INSURANCE

## 2024-03-26 ENCOUNTER — Encounter: Payer: Self-pay | Admitting: Physician Assistant

## 2024-03-26 ENCOUNTER — Other Ambulatory Visit: Payer: Self-pay | Admitting: *Deleted

## 2024-03-26 ENCOUNTER — Ambulatory Visit (INDEPENDENT_AMBULATORY_CARE_PROVIDER_SITE_OTHER): Payer: PRIVATE HEALTH INSURANCE | Admitting: Physician Assistant

## 2024-03-26 VITALS — BP 128/66 | HR 61 | Temp 97.3°F | Resp 13 | Wt 167.3 lb

## 2024-03-26 VITALS — BP 105/69 | HR 83 | Ht 70.0 in | Wt 167.0 lb

## 2024-03-26 DIAGNOSIS — R490 Dysphonia: Secondary | ICD-10-CM | POA: Diagnosis not present

## 2024-03-26 DIAGNOSIS — F32A Depression, unspecified: Secondary | ICD-10-CM | POA: Diagnosis not present

## 2024-03-26 DIAGNOSIS — Z8673 Personal history of transient ischemic attack (TIA), and cerebral infarction without residual deficits: Secondary | ICD-10-CM | POA: Insufficient documentation

## 2024-03-26 DIAGNOSIS — R638 Other symptoms and signs concerning food and fluid intake: Secondary | ICD-10-CM | POA: Diagnosis not present

## 2024-03-26 DIAGNOSIS — D696 Thrombocytopenia, unspecified: Secondary | ICD-10-CM

## 2024-03-26 DIAGNOSIS — F909 Attention-deficit hyperactivity disorder, unspecified type: Secondary | ICD-10-CM | POA: Insufficient documentation

## 2024-03-26 DIAGNOSIS — Z79899 Other long term (current) drug therapy: Secondary | ICD-10-CM | POA: Insufficient documentation

## 2024-03-26 DIAGNOSIS — J439 Emphysema, unspecified: Secondary | ICD-10-CM | POA: Insufficient documentation

## 2024-03-26 DIAGNOSIS — Z7982 Long term (current) use of aspirin: Secondary | ICD-10-CM | POA: Insufficient documentation

## 2024-03-26 DIAGNOSIS — Z87891 Personal history of nicotine dependence: Secondary | ICD-10-CM | POA: Diagnosis not present

## 2024-03-26 DIAGNOSIS — R599 Enlarged lymph nodes, unspecified: Secondary | ICD-10-CM | POA: Diagnosis not present

## 2024-03-26 DIAGNOSIS — Z8042 Family history of malignant neoplasm of prostate: Secondary | ICD-10-CM | POA: Insufficient documentation

## 2024-03-26 DIAGNOSIS — Z85820 Personal history of malignant melanoma of skin: Secondary | ICD-10-CM | POA: Diagnosis not present

## 2024-03-26 DIAGNOSIS — R413 Other amnesia: Secondary | ICD-10-CM

## 2024-03-26 DIAGNOSIS — R419 Unspecified symptoms and signs involving cognitive functions and awareness: Secondary | ICD-10-CM | POA: Diagnosis not present

## 2024-03-26 DIAGNOSIS — Z801 Family history of malignant neoplasm of trachea, bronchus and lung: Secondary | ICD-10-CM | POA: Insufficient documentation

## 2024-03-26 DIAGNOSIS — R630 Anorexia: Secondary | ICD-10-CM | POA: Insufficient documentation

## 2024-03-26 DIAGNOSIS — R0609 Other forms of dyspnea: Secondary | ICD-10-CM | POA: Diagnosis not present

## 2024-03-26 DIAGNOSIS — R109 Unspecified abdominal pain: Secondary | ICD-10-CM | POA: Insufficient documentation

## 2024-03-26 DIAGNOSIS — R591 Generalized enlarged lymph nodes: Secondary | ICD-10-CM | POA: Diagnosis not present

## 2024-03-26 DIAGNOSIS — R194 Change in bowel habit: Secondary | ICD-10-CM | POA: Diagnosis not present

## 2024-03-26 DIAGNOSIS — R634 Abnormal weight loss: Secondary | ICD-10-CM | POA: Insufficient documentation

## 2024-03-26 DIAGNOSIS — R5383 Other fatigue: Secondary | ICD-10-CM | POA: Diagnosis not present

## 2024-03-26 DIAGNOSIS — R918 Other nonspecific abnormal finding of lung field: Secondary | ICD-10-CM | POA: Diagnosis not present

## 2024-03-26 LAB — CBC WITH DIFFERENTIAL (CANCER CENTER ONLY)
Abs Immature Granulocytes: 0.02 K/uL (ref 0.00–0.07)
Basophils Absolute: 0 K/uL (ref 0.0–0.1)
Basophils Relative: 0 %
Eosinophils Absolute: 0.1 K/uL (ref 0.0–0.5)
Eosinophils Relative: 2 %
HCT: 40.1 % (ref 39.0–52.0)
Hemoglobin: 14 g/dL (ref 13.0–17.0)
Immature Granulocytes: 0 %
Lymphocytes Relative: 22 %
Lymphs Abs: 1.3 K/uL (ref 0.7–4.0)
MCH: 32.6 pg (ref 26.0–34.0)
MCHC: 34.9 g/dL (ref 30.0–36.0)
MCV: 93.5 fL (ref 80.0–100.0)
Monocytes Absolute: 0.6 K/uL (ref 0.1–1.0)
Monocytes Relative: 10 %
Neutro Abs: 3.7 K/uL (ref 1.7–7.7)
Neutrophils Relative %: 66 %
Platelet Count: 118 K/uL — ABNORMAL LOW (ref 150–400)
RBC: 4.29 MIL/uL (ref 4.22–5.81)
RDW: 12.4 % (ref 11.5–15.5)
WBC Count: 5.6 K/uL (ref 4.0–10.5)
nRBC: 0 % (ref 0.0–0.2)

## 2024-03-26 LAB — CMP (CANCER CENTER ONLY)
ALT: 27 U/L (ref 0–44)
AST: 29 U/L (ref 15–41)
Albumin: 4.1 g/dL (ref 3.5–5.0)
Alkaline Phosphatase: 59 U/L (ref 38–126)
Anion gap: 3 — ABNORMAL LOW (ref 5–15)
BUN: 26 mg/dL — ABNORMAL HIGH (ref 8–23)
CO2: 32 mmol/L (ref 22–32)
Calcium: 8.8 mg/dL — ABNORMAL LOW (ref 8.9–10.3)
Chloride: 105 mmol/L (ref 98–111)
Creatinine: 0.99 mg/dL (ref 0.61–1.24)
GFR, Estimated: >60 mL/min (ref >60)
Glucose, Bld: 89 mg/dL (ref 70–99)
Potassium: 4.7 mmol/L (ref 3.5–5.1)
Sodium: 140 mmol/L (ref 135–145)
Total Bilirubin: 0.4 mg/dL (ref 0.0–1.2)
Total Protein: 6.1 g/dL — ABNORMAL LOW (ref 6.5–8.1)

## 2024-03-26 LAB — PROTIME-INR
INR: 1 (ref 0.8–1.2)
Prothrombin Time: 13.6 s (ref 11.4–15.2)

## 2024-03-26 LAB — FOLATE: Folate: >20 ng/mL (ref >5.9)

## 2024-03-26 LAB — VITAMIN B12: Vitamin B-12: 829 pg/mL (ref 180–914)

## 2024-03-26 LAB — APTT: aPTT: 33 s (ref 24–36)

## 2024-03-26 LAB — FERRITIN: Ferritin: 103 ng/mL (ref 24–336)

## 2024-03-26 LAB — LACTATE DEHYDROGENASE: LDH: 169 U/L (ref 98–192)

## 2024-03-26 NOTE — Patient Instructions (Signed)
 It was a pleasure to see you today at our office.   Recommendations:     Repeat MRI of the brain, the radiology office will call you to arrange you appointment  [phone] Follow up ib 1 year     [email or web address]   RECOMMENDATIONS FOR ALL PATIENTS WITH MEMORY PROBLEMS: 1. Continue to exercise (Recommend 30 minutes of walking everyday, or 3 hours every week) 2. Increase social interactions - continue going to [place] and enjoy social gatherings with friends and family 3. Eat healthy, avoid fried foods and eat more fruits and vegetables 4. Maintain adequate blood pressure, blood sugar, and blood cholesterol level. Reducing the risk of stroke and cardiovascular disease also helps promoting better memory. 5. Avoid stressful situations. Live a simple life and avoid aggravations. Organize your time and prepare for the next day in anticipation. 6. Sleep well, avoid any interruptions of sleep and avoid any distractions in the bedroom that may interfere with adequate sleep quality 7. Avoid sugar, avoid sweets as there is a strong link between excessive sugar intake, diabetes, and cognitive impairment We discussed the Mediterranean diet, which has been shown to help patients reduce the risk of progressive memory disorders and reduces cardiovascular risk. This includes eating fish, eat fruits and green leafy vegetables, nuts like almonds and hazelnuts, walnuts, and also use olive oil. Avoid fast foods and fried foods as much as possible. Avoid sweets and sugar as sugar use has been linked to worsening of memory function.  There is always a concern of gradual progression of memory problems. If this is the case, then we may need to adjust level of care according to patient needs. Support, both to the patient and caregiver, should then be put into place.       DRIVING: Regarding driving, in patients with progressive memory problems, driving will  be impaired. We advise to have someone else do the driving if trouble finding directions or if minor accidents are reported. Independent driving assessment is available to determine safety of driving.   If you are interested in the driving assessment, you can contact the following:  [facility] [employer] [facility] in [place] [phone]  Driver Rehabilitative Services [phone]  [facility] [phone]  [facility] [phone] or [phone]   FALL PRECAUTIONS: Be cautious when walking. Scan the area for obstacles that may increase the risk of trips and falls. When getting up in the mornings, sit up at the edge of the bed for a few minutes before getting out of bed. Consider elevating the bed at the head end to avoid drop of blood pressure when getting up. Walk always in a well-lit room (use night lights in the walls). Avoid area rugs or power cords from appliances in the middle of the walkways. Use a walker or a cane if necessary and consider physical therapy for balance exercise. Get your eyesight checked regularly.  FINANCIAL OVERSIGHT: Supervision, especially oversight when making financial decisions or transactions is also recommended.  HOME SAFETY: Consider the safety of the kitchen when operating appliances like stoves, microwave oven, and blender. Consider having supervision and share cooking responsibilities until no longer able to participate in those. Accidents with firearms and other hazards in the house should be identified and addressed as well.   ABILITY TO BE LEFT ALONE: If patient is unable to contact 911 operator, consider using LifeLine, or when the need is there, arrange for someone to stay with patients. Smoking is a fire hazard, consider supervision or cessation. Risk of wandering  should be assessed by caregiver and if detected at any point, supervision and safe proof recommendations should be instituted.  MEDICATION SUPERVISION: Inability to  self-administer medication needs to be constantly addressed. Implement a mechanism to ensure safe administration of the medications.      Mediterranean Diet A Mediterranean diet refers to food and lifestyle choices that are based on the traditions of countries located on the [employer] [place]. This way of eating has been shown to help prevent certain conditions and improve outcomes for people who have chronic diseases, like kidney disease and heart disease. What are tips for following this plan? Lifestyle  Cook and eat meals together with your family, when possible. Drink enough fluid to keep your urine clear or pale yellow. Be physically active every day. This includes: Aerobic exercise like running or swimming. Leisure activities like gardening, walking, or housework. Get 7-8 hours of sleep each night. If recommended by your health care provider, drink red wine in moderation. This means 1 glass a day for nonpregnant women and 2 glasses a day for men. A glass of wine equals 5 oz (150 mL). Reading food labels  Check the serving size of packaged foods. For foods such as rice and pasta, the serving size refers to the amount of cooked product, not dry. Check the total fat in packaged foods. Avoid foods that have saturated fat or trans fats. Check the ingredients list for added sugars, such as corn syrup. Shopping  At the grocery store, buy most of your food from the areas near the walls of the store. This includes: Fresh fruits and vegetables (produce). Grains, beans, nuts, and seeds. Some of these may be available in unpackaged forms or large amounts (in bulk). Fresh seafood. Poultry and eggs. Low-fat dairy products. Buy whole ingredients instead of prepackaged foods. Buy fresh fruits and vegetables in-season from local farmers markets. Buy frozen fruits and vegetables in resealable bags. If you do not have access to quality fresh seafood, buy precooked frozen shrimp or canned fish, such  as tuna, salmon, or sardines. Buy small amounts of raw or cooked vegetables, salads, or olives from the deli or salad bar at your store. Stock your pantry so you always have certain foods on hand, such as olive oil, canned tuna, canned tomatoes, rice, pasta, and beans. Cooking  Cook foods with extra-virgin olive oil instead of using butter or other vegetable oils. Have meat as a side dish, and have vegetables or grains as your main dish. This means having meat in small portions or adding small amounts of meat to foods like pasta or stew. Use beans or vegetables instead of meat in common dishes like chili or lasagna. Experiment with different cooking methods. Try roasting or broiling vegetables instead of steaming or sauteing them. Add frozen vegetables to soups, stews, pasta, or rice. Add nuts or seeds for added healthy fat at each meal. You can add these to yogurt, salads, or vegetable dishes. Marinate fish or vegetables using olive oil, lemon juice, garlic, and fresh herbs. Meal planning  Plan to eat 1 vegetarian meal one day each week. Try to work up to 2 vegetarian meals, if possible. Eat seafood 2 or more times a week. Have healthy snacks readily available, such as: Vegetable sticks with hummus. Greek yogurt. Fruit and nut trail mix. Eat balanced meals throughout the week. This includes: Fruit: 2-3 servings a day Vegetables: 4-5 servings a day Low-fat dairy: 2 servings a day Fish, poultry, or lean meat: 1 serving a day  Beans and legumes: 2 or more servings a week Nuts and seeds: 1-2 servings a day Whole grains: 6-8 servings a day Extra-virgin olive oil: 3-4 servings a day Limit red meat and sweets to only a few servings a month What are my food choices? Mediterranean diet Recommended Grains: Whole-grain pasta. Brown rice. Bulgar wheat. Polenta. Couscous. Whole-wheat bread. [place] Made[place]. Vegetables: Artichokes. Beets. Broccoli. Cabbage. Carrots. Eggplant. Green beans. Chard.  Kale. Spinach. Onions. Leeks. Peas. Squash. Tomatoes. Peppers. Radishes. Fruits: Apples. Apricots. Avocado. Berries. Bananas. Cherries. Dates. Figs. Grapes. Lemons. Melon. Oranges. Peaches. Plums. Pomegranate. Meats and other protein foods: Beans. Almonds. Sunflower seeds. Pine nuts. Peanuts. Cod. Salmon. Scallops. Shrimp. Tuna. Tilapia. Clams. Oysters. Eggs. Dairy: Low-fat milk. Cheese. Greek yogurt. Beverages: Water. Red wine. Herbal tea. Fats and oils: Extra virgin olive oil. Avocado oil. Grape seed oil. Sweets and desserts: [place] yogurt with honey. Baked apples. Poached pears. Trail mix. Seasoning and other foods: Basil. Cilantro. Coriander. Cumin. Mint. Parsley. Sage. Rosemary. Tarragon. Garlic. Oregano. Thyme. Pepper. Balsalmic vinegar. Tahini. Hummus. Tomato sauce. Olives. Mushrooms. Limit these Grains: Prepackaged pasta or rice dishes. Prepackaged cereal with added sugar. Vegetables: Deep fried potatoes (french fries). Fruits: Fruit canned in syrup. Meats and other protein foods: Beef. Pork. Lamb. Poultry with skin. Hot dogs. [place]. Dairy: Ice cream. Sour cream. Whole milk. Beverages: Juice. Sugar-sweetened soft drinks. Beer. Liquor and spirits. Fats and oils: Butter. [place]ola oil. Vegetable oil. Beef fat (tallow). Lard. Sweets and desserts: Cookies. Cakes. Pies. Candy. Seasoning and other foods: Mayonnaise. Premade sauces and marinades. The items listed may not be a complete list. Talk with your dietitian about what dietary choices are right for you. Summary The Mediterranean diet includes both food and lifestyle choices. Eat a variety of fresh fruits and vegetables, beans, nuts, seeds, and whole grains. Limit the amount of red meat and sweets that you eat. Talk with your health care provider about whether it is safe for you to drink red wine in moderation. This means 1 glass a day for nonpregnant women and 2 glasses a day for men. A glass of wine equals 5 oz (150 mL). This information  is not intended to replace advice given to you by your health care provider. Make sure you discuss any questions you have with your health care provider. Document Released: 02/12/2016 Document Revised: 03/16/2016 Document Reviewed: 02/12/2016 Elsevier Interactive Patient Education  2017 [employer].

## 2024-03-26 NOTE — Progress Notes (Signed)
 Assessment/Plan:    Cognitive complaints, likely multifactorial  [name] is a very pleasant 73 y.o. 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 Patient is monitored on antidementia medication, not indicated at this time given the results of the neuropsych evaluation yielding normal values.  Despite the normal evaluation, in view of his history of vascular disease, we are to monitor for any cognitive changes in the future.  His last MRI of the brain was equivocal, will repeat to determine the presence of remote strokes versus artifact.  In the past also a CT angio of the head and neck were ordered, but they were never performed.  He may benefit from having these done.  Pending on the results, will consider starting donepezil at the low-dose.  He reports that his memory is stable.  He is accompanied by his wife who supplements the history, prior records were reviewed prior to today's visit.  He was last seen on 12/21/2023 with [facility] 23/30     Recommendations:   Follow up in 1 year  Recommend good control of cardiovascular risk factors Continue to control mood as per psychiatry Recommend using hearing aids in an effort to improve comprehension Repeat MRI of the brain to evaluate for any structural abnormality and vascular load Proceed with CT angio of the head and neck to further delineate the prior abnormalities seen on brain.  Pending on the results we will consider antidementia medication Monitor driving   Neuropsych evaluation Dr. Gayland 02/2024 briefly, results indicated normal cognitive functioning, with no evidence of a neurocognitive disorder at this time. His overall profile reflects healthy cognitive aging, with well-preserved functional abilities.  Based on my comparison with the last evaluation, it appears his scores have improved, given that the areas previously identified as impaired are no longer impaired today. Etiology of subjective cognitive concerns is most likely multifactorial, including normal aging, ADHD, cerebrovascular changes, and ongoing mood disturbance. Most encouragingly, despite some uncertainty on his last MRI as to whether the findings represent an infarct or artifact, his current performance does not show evidence of cognitive impairment attributable to that event.    Initial visit 12/21/2023 How long did patient have memory difficulties?  For about 10 years, gradually worse over the last 6 months.  He has initially been seen in 2017, not seen since. He was referred to us  by psychiatry.  Patient reports some difficulty remembering new information, recent conversations, names.  He reports that his response time is slower, cannot longer solve many complex issues which brings frustration. Sometimes I say the wrong word or the wrong tense.  He likes to do Sudoku.  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 Repeats oneself?  Endorsed, this is new. I asked my wife to tell me when I do to be aware of it.  Disoriented when walking into a room? Denies    Leaving objects in unusual places?  Denies. I caught myself I was about to and then I realized it was the wrong place, this is new, over the last month   Wandering behavior? Denies.   Any personality changes, or depression, anxiety?  He has a history of anxiety and depression, and reports having been very irritable and  emotional lately.  He has been caring for his wife who is wheelchair-bound, very sick which adds to his stress.He is under the care of Dr. Tasia, Psychiatry.  Recently, he was started on Depakote currently at 500 mg at night with some relief.  He was irritable to his grandkids and it made him  feel bad about it.  In the past, he had a history of depression, self admitted to a psychiatric hospital in 1973 due to flashbacks of trauma due to acid trip and per chart report, with neuropsych evaluation showing sufficient impairment in executive functioning but with no impairment of memory.  Repeat neuropsych evaluation in January 2020 showed marked improvement across multiple cognitive domains in comparison to his course in October 2018, and therefore it was suspected that his memory concerns at the time have a psychiatric component. Hallucinations or paranoia? Denies.  Seizures? Denies.    Any sleep changes?  Sleeps better with Lunesta. I have not hay recent vivid dreams although all my life I had very vivid ones in the past. Denies nightmares . Sometimes he reenacts his dreams . Denies sleepwalking   Sleep apnea? Denies.   Any hygiene concerns?  Denies.   Independent of bathing and dressing? Endorsed  Does the patient need help with medications?  Patient is in charge,  every day I may miss a dose due to distraction.    Who is in charge of the finances?  Wife is in charge .    Any changes in appetite? Been really lousy, food does not taste the same. Lost 14 lbs over the last 3 months.     Patient have trouble swallowing?  Denies.   Does the patient cook?  yes, denies forgetting common recipes or kitchen accidents   Any headaches?  Denies.   Chronic pain? Denies.   Ambulates with difficulty? Denies. No longer walks 3 miles a day, but since his wife is on a wheelchair, I don't do anything anymore. Recent falls or head injuries?  About 2 years ago the patient had a fall on the treadmill, when his BP dropped, losing consciousness Vision changes? He reports  Opaque thickness in the vitreous fluid on both eyes than need to be evaluated with ophthalmology. Has a history of cataract surgery.   Any strokelike symptoms? Denies.   Any tremors? Denies.   Any anosmia? Denies.   Any  incontinence of urine? Urge, will have prostate surgery in 2 months for BPH Any bowel dysfunction? I had more problems with constipation.      Patient lives with his wife who is wheelchair bound  History of heavy alcohol intake? Denies.   History of heavy tobacco use? Denies.   Family history of dementia?  GGM  with AD  Does patient drive? He has more difficulty with directions always being a visual person , and not longer visualize the drive, now uses GPS. I don't trust myself, makes me anxious, and I had a couple of road rage episodes.  Retired Art gallery manager for [employer].  In the past he worked for [employer]. 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 last stress test 2003   Pneumonia 2009   Primary immune deficiency disorder (HCC)    Tumor cells, benign    lung, left side   Unstable angina (HCC) 10/28/2017     Past Surgical History:  Procedure Laterality Date   APPENDECTOMY  1984   CATARACT EXTRACTION     L and R eye   CHOLECYSTECTOMY  2002    HYDROCELE EXCISION Right 11/04/2016   Procedure: HYDROCELECTOMY ADULT;  Surgeon: Garnette Shack, MD;  Location: [facility];  Service: Urology;  Laterality: Right;   INGUINAL HERNIA REPAIR Bilateral 11/04/2016   Procedure: HERNIA REPAIR INGUINAL ADULT BILATERAL;  Surgeon: Krystal Russell, MD;  Location: [facility];  Service: General;  Laterality: Bilateral;   INSERTION OF MESH Bilateral 11/04/2016   Procedure: INSERTION OF MESH;  Surgeon: Krystal Russell, MD;  Location: [facility];  Service: General;  Laterality: Bilateral;   JOINT REPLACEMENT     Lt hip   LEFT HEART CATH AND CORONARY ANGIOGRAPHY N/A 10/31/2017   Procedure: LEFT HEART CATH AND CORONARY ANGIOGRAPHY;  Surgeon: Burnard Debby LABOR, MD;  Location: [facility];  Service: Cardiovascular;  Laterality: N/A;   MASTOIDECTOMY  1996   NASAL SEPTUM SURGERY     sinus surgery   PARATHYROIDECTOMY     TOTAL HIP REVISION  06/14/2011   Procedure: TOTAL HIP REVISION;  Surgeon: Dempsey JINNY Sensor;  Location: [facility];  Service: Orthopedics;  Laterality: Left;  Left Acetabular  Hip Revision     PREVIOUS MEDICATIONS:   CURRENT MEDICATIONS:  Outpatient Encounter Medications as of 03/26/2024  Medication Sig   albuterol  (VENTOLIN  HFA) 108 (90 Base) MCG/ACT inhaler Inhale 2 puffs into the lungs every 6 (six) hours as needed for wheezing or shortness of breath.   aspirin  81 MG chewable tablet Chew 81 mg by mouth daily.   Budeson-Glycopyrrol-Formoterol (BREZTRI  AEROSPHERE) 160-9-4.8 MCG/ACT AERO Inhale 2 puffs into the lungs in the morning and at bedtime.   celecoxib (CELEBREX) 200 MG capsule Take 200 mg by mouth daily.   cetirizine (ZYRTEC) 10 MG chewable tablet Chew by mouth.   divalproex (DEPAKOTE) 250 MG DR tablet Take 250 mg by mouth 3 (three) times daily. Takes two tablets   Docusate Sodium  (DSS) 100 MG CAPS Take by mouth.   eszopiclone (LUNESTA) 2 MG TABS tablet Take 2 mg by mouth at bedtime as needed for sleep. Take immediately before bedtime   famotidine   (PEPCID ) 20 MG tablet Take 1 tablet (20 mg total) by mouth at bedtime.   methylphenidate  (RITALIN ) 10 MG tablet 1 tablet on empty stomach   metoprolol  succinate (TOPROL -XL) 25 MG 24 hr tablet Take 1 tablet (25 mg total) by mouth daily.   Multiple Vitamin (MULTIVITAMIN) tablet Take 1 tablet by mouth daily.   nitroGLYCERIN  (NITROSTAT ) 0.4 MG SL tablet Place 1  tablet (0.4 mg total) under the tongue every 5 (five) minutes as needed for chest pain.   omeprazole (PRILOSEC) 40 MG capsule Take 40 mg by mouth every morning.   pantoprazole  (PROTONIX ) 40 MG tablet Take 40 mg by mouth daily.   polyethylene glycol (MIRALAX  / GLYCOLAX ) packet Take 17 g by mouth daily as needed (for constipation.).    rosuvastatin  (CRESTOR ) 10 MG tablet TAKE 1 TABLET(10 MG) BY MOUTH DAILY   sertraline (ZOLOFT) 100 MG tablet Take by mouth.   SYNTHROID 25 MCG tablet Take 25 mcg by mouth daily before breakfast.    tamsulosin  (FLOMAX ) 0.4 MG CAPS capsule Take 0.4 mg by mouth daily after breakfast.    No facility-administered encounter medications on file as of 03/26/2024.         12/21/2023    7:00 AM 10/27/2015   11:00 AM  Montreal Cognitive Assessment   Visuospatial/ Executive (0/5) 5 3  Naming (0/3) 3 3  Attention: Read list of digits (0/2) 2 2  Attention: Read list of letters (0/1) 1 1  Attention: Serial 7 subtraction starting at 100 (0/3) 2 1  Language: Repeat phrase (0/2) 2 2  Language : Fluency (0/1) 1 1  Abstraction (0/2) 2 2  Delayed Recall (0/5) 0 3  Orientation (0/6) 5 4  Total 23 22  Adjusted Score (based on education) 23 22       08/05/2020    2:55 PM  MMSE - Mini Mental State Exam  Orientation to time 4  Orientation to Place 5  Registration 3  Attention/ Calculation 5  Recall 1  Language- name 2 objects 2  Language- repeat 1  Language- follow 3 step command 3  Language- read & follow direction 1  Write a sentence 1  Copy design 1  Total score 27         Total time spent on today's visit  was 30 minutes dedicated to this patient today, preparing to see patient, examining the patient, ordering tests and/or medications and counseling the patient, documenting clinical information in the EHR or other health record, independently interpreting results and communicating results to the patient/family, discussing treatment and goals, answering patient's questions and coordinating care.  Cc:  Verena Mems, MD  Camie Sevin 03/26/2024 6:56 AM

## 2024-03-26 NOTE — Progress Notes (Signed)
 [facility] CONSULT NOTE  Patient Care Team: Verena Mems, MD as PCP - General (Family Medicine) Nahser, Aleene PARAS, MD (Inactive) as PCP - Cardiology (Cardiology)  ASSESSMENT & PLAN 73 y.o.male with history of early-stage melanoma, CAD, CVA, emphysema, GERD, hypothyroidism referred to Hematology for thrombocytopenia.  Patient present thrombocytopenia with release of symptoms.  The symptoms are not related.  Thrombocytopenia is very mild.  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  We discussed unexpected weight loss, lymphadenopathy, decreased appetite, energy loss concerning symptoms.  Recommend imaging to rule out malignancy.  Will obtain additional labs today.  We will follow-up in about 2 weeks after results available.  Constitutional symptoms Unintentional weight loss, fatigue, and appetite changes need to rule out malignancy or  - Order CT scan of neck, chest, and abdomen to evaluate for malignancy. - CBC, CMP, LDH, MM panel - Schedule follow-up appointment to discuss CT results.  Enlarged lymph nodes  Intermittent lymph node swelling with recent size decrease. Possible reactive lymphadenopathy or malignancy. - Evaluate lymph nodes in CT scan. - Review CT scan results for lymphadenopathy.  Shortness of breath on exertion New symptom, cardiac issues ruled out. - Include chest in CT scan to evaluate for pulmonary causes.  Emphysema History of smoking, possible progression of emphysema causing exertional dyspnea. - Include chest in CT scan to evaluate emphysema and history of lung nodule  Gastrointestinal symptoms Increased bowel movements and abdominal discomfort. Previous colonoscopy showed polyp and hemorrhoid. - CT has been ordered. - Consider referral to GI specialist if CT scan nondiagnostic.  Thrombocytopenia - Borderline.  Will repeat CBC today. - PT, APTT, folate, B12, copper   History of stroke Mild stroke 10 months ago, on aspirin  for prevention. - Continue  aspirin  therapy for stroke prevention.  History of melanoma and squamous cell carcinoma, regular dermatology follow-ups. - Continue regular dermatology follow-ups. - Unexpected weight loss and constitutional symptoms, will obtain CT for evaluation to rule out metastases  Patient will follow-up in about 2 to 3 weeks to discuss results.  Orders Placed This Encounter  Procedures   CT CHEST ABDOMEN PELVIS W CONTRAST    Standing Status:   Future    Expected Date:   04/02/2024    Expiration Date:   03/26/2025    If indicated for the ordered procedure, I authorize the administration of contrast media per Radiology protocol:   Yes    Does the patient have a contrast media/X-ray dye allergy?:   No    Preferred imaging location?:   [facility]    If indicated for the ordered procedure, I authorize the administration of oral contrast media per Radiology protocol:   Yes   CT Soft Tissue Neck W Contrast    Standing Status:   Future    Expected Date:   04/02/2024    Expiration Date:   03/26/2025    If indicated for the ordered procedure, I authorize the administration of contrast media per Radiology protocol:   Yes    Does the patient have a contrast media/X-ray dye allergy?:   No    Preferred imaging location?:   [facility]   All questions were answered. The patient knows to call the clinic with any problems, questions or concerns. No barriers to learning was detected.  Richard JAYSON Chihuahua, MD 03/26/2024 12:46 PM  CHIEF COMPLAINTS/PURPOSE OF CONSULTATION:  Thrombocytopenia  HISTORY OF PRESENTING ILLNESS:  [name] 73 y.o. male is here because of thrombocytopenia.  Patient presented to PCP  and reported purpura in the upper arm.  He is found aspirin  81 mg once daily.  He is on celecoxib but not on nonselective NSAID.  He is on Zoloft every day.    He has experienced a 15-pound unintentional weight loss over the past 14 months, accompanied by significant fatigue and weakness. He  alternates between loss of appetite and feeling famished, with a notable loss of body fat and muscle mass. Activities such as walking to the mailbox or playing with his grandson cause exhaustion. No night sweats or fever. He notes darker stools recently.  He reports stomach problems, including increased frequency of bowel movements and stomach discomfort. He has noticed swollen lymph nodes that come and go, particularly in the groin area.  Report of new hoarseness in the last few months.  He has a history of a mild stroke 10 months ago, which he believes affected his appetite initially.   He experiences shortness of breath that has developed over the last two months and has difficulty swallowing due to a constant dry mouth. He reports frequent burping.  His past medical history includes ADHD, major depression, and a mild stroke. He takes several medications including Depakote, Celebrex, Ritalin , Remeron, Zoloft, baby aspirin , multivitamins, vitamin D, and magnesium . He has a history of high cholesterol managed with medication and a 20% blockage in a coronary artery. He quit smoking in 1987 after smoking for 22 years.  He has a history of melanoma on his neck, treated with wide local excision in 2015, and squamous cell carcinoma. He has regular dermatology check-ups. No history of HIV or hepatitis and no risk factors for these conditions. He reports a balanced diet is challenging due to his lack of appetite, often relying on protein drinks and NutriGrain bars.  No other supplements such as ginkgo, garlic, vitamin E, fish oil.  No history of heavy alcohol use.  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 last stress test 2003   Pneumonia 2009   Primary immune deficiency disorder (HCC)    Tumor cells, benign    lung, left side   Unstable angina (HCC) 10/28/2017    SURGICAL HISTORY: Past Surgical History:  Procedure Laterality Date   APPENDECTOMY  1984   CATARACT EXTRACTION     L and R eye   CHOLECYSTECTOMY  2002   HYDROCELE EXCISION Right 11/04/2016   Procedure: HYDROCELECTOMY ADULT;  Surgeon: Garnette Shack, MD;  Location: [facility];  Service: Urology;  Laterality: Right;   INGUINAL HERNIA REPAIR Bilateral 11/04/2016   Procedure: HERNIA REPAIR INGUINAL ADULT BILATERAL;  Surgeon: Krystal Russell, MD;  Location: [facility];  Service: General;  Laterality: Bilateral;   INSERTION OF MESH Bilateral 11/04/2016   Procedure: INSERTION OF MESH;  Surgeon: Krystal Russell, MD;  Location: [facility];  Service: General;  Laterality: Bilateral;   JOINT REPLACEMENT  Lt hip   LEFT HEART CATH AND CORONARY ANGIOGRAPHY N/A 10/31/2017   Procedure: LEFT HEART CATH AND CORONARY ANGIOGRAPHY;  Surgeon: Burnard Debby LABOR, MD;  Location: [facility];  Service: Cardiovascular;  Laterality: N/A;   MASTOIDECTOMY  1996   NASAL SEPTUM SURGERY     sinus surgery   PARATHYROIDECTOMY     TOTAL HIP REVISION  06/14/2011   Procedure: TOTAL HIP REVISION;  Surgeon: Dempsey JINNY Sensor;  Location: [facility];  Service: Orthopedics;  Laterality: Left;  Left Acetabular  Hip Revision    SOCIAL HISTORY: Social History   Socioeconomic History   Marital status: Married    Spouse name: [name]   Number of children: 3   Years of education: masters   Highest education level: Master's degree (e.g., MA, MS, MEng, MEd, MSW, MBA)  Occupational History    Comment: Sales   Occupation: Retired  Tobacco Use   Smoking  status: Former    Current packs/day: 0.00    Average packs/day: 1.5 packs/day for 24.0 years (36.0 ttl pk-yrs)    Types: Cigarettes    Start date: 1966    Quit date: 07/05/1985    Years since quitting: 38.7    Passive exposure: Never   Smokeless tobacco: Never  Vaping Use   Vaping status: Never Used  Substance and Sexual Activity   Alcohol use: Yes    Comment: occ   Drug use: No   Sexual activity: Not on file  Other Topics Concern   Not on file  Social History Narrative   Patient lives at home with his wife Leta) Patient is Transport planner. Patient has his masters.Right handed.Caffeine - one cup daily.   One level home   Right handed   Retired   3 children 7 grandkids   Social Drivers of Corporate investment banker Strain: Not on file  Food Insecurity: No Food Insecurity (03/26/2024)   Hunger Vital Sign    Worried About Running Out of Food in the Last Year: Never true    Ran Out of Food in the Last Year: Never true  Transportation Needs: No Transportation Needs (03/26/2024)   PRAPARE - Administrator, Civil Service (Medical): No    Lack of Transportation (Non-Medical): No  Physical Activity: Not on file  Stress: Not on file  Social Connections: Not on file  Intimate Partner Violence: Not At Risk (03/26/2024)   Humiliation, Afraid, Rape, and Kick questionnaire    Fear of Current or Ex-Partner: No    Emotionally Abused: No    Physically Abused: No    Sexually Abused: No    FAMILY HISTORY: Family History  Problem Relation Age of Onset   Heart Problems Mother    Lung cancer Father 34       smoked   Heart Problems Father    Neuropathy Sister    Cancer Brother        prostate   Lupus Daughter    Healthy Daughter    Multiple sclerosis Son     ALLERGIES:  is allergic to bupropion , ciprofloxacin -dexamethasone , gabapentin , ciprofloxacin  hcl, and clonidine hcl.  MEDICATIONS:  Current Outpatient Medications  Medication Sig Dispense Refill   albuterol   (VENTOLIN  HFA) 108 (90 Base) MCG/ACT inhaler Inhale 2 puffs into the lungs every 6 (six) hours as needed for wheezing or shortness of breath. 8 g 3   Artificial Saliva (ACT DRY MOUTH) LOZG 1 tablet Mouth/Throat 3-4 times day as needed     aspirin   81 MG chewable tablet Chew 81 mg by mouth daily.     Budeson-Glycopyrrol-Formoterol (BREZTRI  AEROSPHERE) 160-9-4.8 MCG/ACT AERO Inhale 2 puffs into the lungs in the morning and at bedtime. 3 each 3   celecoxib (CELEBREX) 200 MG capsule Take 200 mg by mouth daily.     cetirizine (ZYRTEC) 10 MG chewable tablet Chew by mouth.     divalproex (DEPAKOTE) 250 MG DR tablet Take 250 mg by mouth 2 (two) times daily. Takes two tablets     Docusate Sodium  (DSS) 100 MG CAPS Take by mouth.     eszopiclone (LUNESTA) 2 MG TABS tablet Take 2 mg by mouth at bedtime as needed for sleep. Take immediately before bedtime     famotidine  (PEPCID ) 20 MG tablet Take 1 tablet (20 mg total) by mouth at bedtime. 30 tablet 3   levothyroxine (SYNTHROID) 50 MCG tablet 2 tablets Orally Once a day; Duration: 90 days     Magnesium  200 MG TABS Magnesium  gluconate     methylphenidate  (RITALIN ) 10 MG tablet 1 tablet on empty stomach     metoprolol  succinate (TOPROL -XL) 25 MG 24 hr tablet Take 1 tablet (25 mg total) by mouth daily. 90 tablet 3   mirtazapine (REMERON) 30 MG tablet Take 30 mg by mouth at bedtime.     Multiple Vitamin (MULTIVITAMIN) tablet Take 1 tablet by mouth daily.     nitroGLYCERIN  (NITROSTAT ) 0.4 MG SL tablet Place 1 tablet (0.4 mg total) under the tongue every 5 (five) minutes as needed for chest pain. 25 tablet 3   pantoprazole  (PROTONIX ) 40 MG tablet Take 40 mg by mouth 2 (two) times daily.     polyethylene glycol (MIRALAX  / GLYCOLAX ) packet Take 17 g by mouth daily as needed (for constipation.).      Probiotic Product (PROBIOTIC DAILY) CAPS Pre/Probiotic Orally once a day     rosuvastatin  (CRESTOR ) 10 MG tablet TAKE 1 TABLET(10 MG) BY MOUTH DAILY 90 tablet 3    sertraline (ZOLOFT) 100 MG tablet Take by mouth.     tamsulosin  (FLOMAX ) 0.4 MG CAPS capsule Take 0.4 mg by mouth daily after breakfast.      No current facility-administered medications for this visit.    REVIEW OF SYSTEMS:   All relevant systems were reviewed with the patient and are negative.  PHYSICAL EXAMINATION: ECOG PERFORMANCE STATUS: 2 - Symptomatic, <50% confined to bed  Vitals:   03/26/24 0956  BP: 128/66  Pulse: 61  Resp: 13  Temp: (!) 97.3 F (36.3 C)  SpO2: 100%   Filed Weights   03/26/24 0956  Weight: 167 lb 4.8 oz (75.9 kg)    GENERAL: alert, no distress and comfortable SKIN: skin color normal and no bruising or petechiae  EYES: normal, sclera clear OROPHARYNX: no exudate  NECK: No palpable mass LYMPH: Small palpable bilateral axillary lymphadenopathy; no inguinal lymphadenopathy  LUNGS: clear to auscultation and percussion with normal breathing effort HEART: regular rate & rhythm and no murmurs  ABDOMEN: abdomen soft, non-tender and nondistended.   LABORATORY DATA:  I have reviewed the data as listed  RADIOGRAPHIC STUDIES: I have personally reviewed the radiological images as listed and agreed with the findings in the report. New imaging ordered.

## 2024-03-27 DIAGNOSIS — L821 Other seborrheic keratosis: Secondary | ICD-10-CM | POA: Diagnosis not present

## 2024-03-27 DIAGNOSIS — L218 Other seborrheic dermatitis: Secondary | ICD-10-CM | POA: Diagnosis not present

## 2024-03-27 LAB — KAPPA/LAMBDA LIGHT CHAINS
Kappa free light chain: 16.6 mg/L (ref 3.3–19.4)
Kappa, lambda light chain ratio: 1.19 (ref 0.26–1.65)
Lambda free light chains: 13.9 mg/L (ref 5.7–26.3)

## 2024-03-27 LAB — COPPER, SERUM: Copper: 64 ug/dL — ABNORMAL LOW (ref 69–132)

## 2024-03-29 ENCOUNTER — Ambulatory Visit: Payer: Self-pay

## 2024-03-29 DIAGNOSIS — H7293 Unspecified perforation of tympanic membrane, bilateral: Secondary | ICD-10-CM | POA: Diagnosis not present

## 2024-03-29 DIAGNOSIS — H9211 Otorrhea, right ear: Secondary | ICD-10-CM | POA: Diagnosis not present

## 2024-03-29 DIAGNOSIS — H6123 Impacted cerumen, bilateral: Secondary | ICD-10-CM | POA: Diagnosis not present

## 2024-03-29 DIAGNOSIS — H906 Mixed conductive and sensorineural hearing loss, bilateral: Secondary | ICD-10-CM | POA: Diagnosis not present

## 2024-03-29 LAB — MULTIPLE MYELOMA PANEL, SERUM
Albumin SerPl Elph-Mcnc: 3.4 g/dL (ref 2.9–4.4)
Albumin/Glob SerPl: 1.5 (ref 0.7–1.7)
Alpha 1: 0.2 g/dL (ref 0.0–0.4)
Alpha2 Glob SerPl Elph-Mcnc: 0.7 g/dL (ref 0.4–1.0)
B-Globulin SerPl Elph-Mcnc: 0.9 g/dL (ref 0.7–1.3)
Gamma Glob SerPl Elph-Mcnc: 0.6 g/dL (ref 0.4–1.8)
Globulin, Total: 2.4 g/dL (ref 2.2–3.9)
IgA: 100 mg/dL (ref 61–437)
IgG (Immunoglobin G), Serum: 637 mg/dL (ref 603–1613)
IgM (Immunoglobulin M), Srm: 112 mg/dL (ref 15–143)
Total Protein ELP: 5.8 g/dL — ABNORMAL LOW (ref 6.0–8.5)

## 2024-03-29 NOTE — Progress Notes (Signed)
 Procedure Note - Cerumen impaction, Bilateral:  Cc: 44-month return visit. Hearing aid user. History of bilateral TM perforations; felt some wetness in right ear yesterday. Sensitivity to topical Cipro . Patient begin worked up for possible hematologic ca.  Risks/benefits and alternatives were discussed with patient who understands and agrees to proceed.  DETAILS OF PROCEDURE: The patient was positioned and the ear was examined with the microscope. Obstructing cerumen was gently removed using forceps from the left ear canal. Minimal mucopurulence along the floor of the ear canal. Left TM with clean, dry 30% anterior perforation. No granulation tissue.  Suctioned out obstructing cerumen from the right ear canal with underlying mucopurulence. Right TM with 50% clean, dry perforation. No granulation tissue.   The patient tolerated this well.  No complications.  Follow up: 3 weeks. Begin use of CASH powder in both ears 3-4 x per week. Strict water precautions.

## 2024-04-02 ENCOUNTER — Ambulatory Visit (HOSPITAL_COMMUNITY)
Admission: RE | Admit: 2024-04-02 | Discharge: 2024-04-02 | Disposition: A | Discharge status: Home / Self Care | Source: Ambulatory Visit

## 2024-04-02 DIAGNOSIS — R918 Other nonspecific abnormal finding of lung field: Secondary | ICD-10-CM | POA: Diagnosis not present

## 2024-04-02 DIAGNOSIS — R490 Dysphonia: Secondary | ICD-10-CM | POA: Insufficient documentation

## 2024-04-02 DIAGNOSIS — N400 Enlarged prostate without lower urinary tract symptoms: Secondary | ICD-10-CM | POA: Diagnosis not present

## 2024-04-02 DIAGNOSIS — R591 Generalized enlarged lymph nodes: Secondary | ICD-10-CM | POA: Diagnosis not present

## 2024-04-02 DIAGNOSIS — Z23 Encounter for immunization: Secondary | ICD-10-CM | POA: Diagnosis not present

## 2024-04-02 DIAGNOSIS — R058 Other specified cough: Secondary | ICD-10-CM | POA: Diagnosis not present

## 2024-04-02 DIAGNOSIS — J441 Chronic obstructive pulmonary disease with (acute) exacerbation: Secondary | ICD-10-CM | POA: Diagnosis not present

## 2024-04-02 DIAGNOSIS — R634 Abnormal weight loss: Secondary | ICD-10-CM | POA: Diagnosis not present

## 2024-04-02 DIAGNOSIS — J40 Bronchitis, not specified as acute or chronic: Secondary | ICD-10-CM | POA: Diagnosis not present

## 2024-04-02 DIAGNOSIS — R5383 Other fatigue: Secondary | ICD-10-CM | POA: Insufficient documentation

## 2024-04-02 DIAGNOSIS — N281 Cyst of kidney, acquired: Secondary | ICD-10-CM | POA: Diagnosis not present

## 2024-04-02 MED ORDER — IOHEXOL 300 MG/ML  SOLN
100.0000 mL | Freq: Once | INTRAMUSCULAR | Status: AC | PRN
  Administered 2024-04-02: 100 mL via INTRAVENOUS

## 2024-04-03 ENCOUNTER — Ambulatory Visit: Payer: Self-pay

## 2024-04-03 ENCOUNTER — Telehealth: Payer: Self-pay

## 2024-04-03 DIAGNOSIS — D696 Thrombocytopenia, unspecified: Secondary | ICD-10-CM

## 2024-04-03 DIAGNOSIS — J441 Chronic obstructive pulmonary disease with (acute) exacerbation: Secondary | ICD-10-CM | POA: Diagnosis not present

## 2024-04-03 DIAGNOSIS — I700 Atherosclerosis of aorta: Secondary | ICD-10-CM | POA: Diagnosis not present

## 2024-04-03 DIAGNOSIS — F330 Major depressive disorder, recurrent, mild: Secondary | ICD-10-CM | POA: Diagnosis not present

## 2024-04-03 DIAGNOSIS — E038 Other specified hypothyroidism: Secondary | ICD-10-CM | POA: Diagnosis not present

## 2024-04-03 DIAGNOSIS — R634 Abnormal weight loss: Secondary | ICD-10-CM

## 2024-04-03 DIAGNOSIS — I25119 Atherosclerotic heart disease of native coronary artery with unspecified angina pectoris: Secondary | ICD-10-CM | POA: Diagnosis not present

## 2024-04-03 NOTE — Telephone Encounter (Signed)
 Rescheduled patients follow-up on 10/7. Called and spoke with the patients wife, she is aware of new date and time.

## 2024-04-06 ENCOUNTER — Inpatient Hospital Stay: Admission: RE | Admit: 2024-04-06 | Payer: PRIVATE HEALTH INSURANCE | Source: Ambulatory Visit

## 2024-04-10 ENCOUNTER — Telehealth: Payer: Self-pay

## 2024-04-10 ENCOUNTER — Ambulatory Visit

## 2024-04-10 NOTE — Telephone Encounter (Signed)
 Patients wife called wanting to reschedule her husbands appointment on 10/27 since he wont be getting his bone marrow biopsy until November 3rd. I rescheduled his appointment for November 10th

## 2024-04-11 ENCOUNTER — Ambulatory Visit: Admitting: Podiatry

## 2024-04-11 ENCOUNTER — Encounter: Payer: Self-pay | Admitting: Podiatry

## 2024-04-11 DIAGNOSIS — M79674 Pain in right toe(s): Secondary | ICD-10-CM | POA: Diagnosis not present

## 2024-04-11 DIAGNOSIS — M79675 Pain in left toe(s): Secondary | ICD-10-CM | POA: Diagnosis not present

## 2024-04-11 DIAGNOSIS — B351 Tinea unguium: Secondary | ICD-10-CM | POA: Diagnosis not present

## 2024-04-13 ENCOUNTER — Telehealth: Payer: Self-pay

## 2024-04-13 NOTE — Telephone Encounter (Signed)
 Patient is having increasing GI issues that have been going on for a while but are getting worse. He is scheduled for a bone biopsy and wanted to know if he was okay to go to his GI doctor. Assured him that he can go to them and if they feel it needs to be postponed we will work on that then. VU. Arland Legions BSN RN

## 2024-04-15 ENCOUNTER — Encounter: Payer: Self-pay | Admitting: Podiatry

## 2024-04-15 NOTE — Progress Notes (Signed)
  Subjective:  Patient ID: [name], male    DOB: 14-Jan-1951,  MRN: [id number]  [name] presents to clinic today for painful mycotic toenails of both feet that are difficult to trim. Pain interferes with daily activities and wearing enclosed shoe gear comfortably.  Chief Complaint  Patient presents with   RFC     RFC Non diabetic toenail trim. LOV with PCP 03/2024   New problem(s): None.   PCP is Chet Mad, DO.  Allergies  Allergen Reactions   Bupropion  Other (See Comments)    Caused altered mental status Altered mental status Caused altered mental status   Ciprofloxacin -Dexamethasone  Other (See Comments)    Burning in ears when Cipro  ear drops were used unknown Burning in ears when Cipro  ear drops were used   Gabapentin  Other (See Comments)    Caused weakness, dizziness, and forgetfulness  Weakness and dizziness with higher doses. Weakness and dizziness Caused weakness, dizziness, and forgetfulness   Ciprofloxacin  Hcl Other (See Comments)    Burning in ears when drops were used   Clonidine Hcl Other (See Comments)    Review of Systems: Negative except as noted in the HPI.  Objective: No changes noted in today's physical examination. There were no vitals filed for this visit. [name] is a pleasant 73 y.o. male WD, WN in NAD. AAO x 3.  Neurovascular status intact bilateral lower extremities.  Dermatological Examination: Pedal skin with normal turgor, texture and tone b/l. No open wounds nor interdigital macerations noted. Toenails 1-5 b/l thick, discolored, elongated with subungual debris and pain on dorsal palpation. No hyperkeratotic lesions noted b/l.   Musculoskeletal Examination: Muscle strength 5/5 to b/l LE.  No pain, crepitus noted b/l. No gross pedal deformities. Patient ambulates independently without assistive aids.   Radiographs: None  Assessment/Plan: 1. Pain due to onychomycosis of toenails of both feet   Patient was  evaluated and treated. All patient's and/or POA's questions/concerns addressed on today's visit. Mycotic toenails 1-5 debrided in length and girth without incident. Treatment was provided by assistant Andrez Manchester under my supervision. Continue soft, supportive shoe gear daily. Report any pedal injuries to medical professional. Call office if there are any quesitons/concerns. -Recommended diabetic socks and houseslippers to provide more padding. -Patient/POA to call should there be question/concern in the interim.   Return in about 3 months (around 07/12/2024).  Delon LITTIE Merlin, DPM      [facility] LOCATION: [address], [place] [address]                   Office [phone]   [facility] LOCATION: [address] [place], [place] [address] Office [phone]

## 2024-04-23 ENCOUNTER — Ambulatory Visit
Admission: RE | Admit: 2024-04-23 | Discharge: 2024-04-23 | Disposition: A | Discharge status: Home / Self Care | Payer: PRIVATE HEALTH INSURANCE | Source: Ambulatory Visit | Attending: Physician Assistant | Admitting: Physician Assistant

## 2024-04-23 DIAGNOSIS — R413 Other amnesia: Secondary | ICD-10-CM | POA: Diagnosis not present

## 2024-04-23 DIAGNOSIS — R634 Abnormal weight loss: Secondary | ICD-10-CM | POA: Diagnosis not present

## 2024-04-23 DIAGNOSIS — R194 Change in bowel habit: Secondary | ICD-10-CM | POA: Diagnosis not present

## 2024-04-23 DIAGNOSIS — R109 Unspecified abdominal pain: Secondary | ICD-10-CM | POA: Diagnosis not present

## 2024-04-25 ENCOUNTER — Ambulatory Visit: Payer: Self-pay | Admitting: Physician Assistant

## 2024-04-26 ENCOUNTER — Other Ambulatory Visit: Payer: Self-pay | Admitting: Physician Assistant

## 2024-04-26 MED ORDER — DONEPEZIL HCL 10 MG PO TABS: ORAL_TABLET | ORAL | 3 refills | Status: DC

## 2024-04-26 NOTE — Progress Notes (Signed)
 I left message to call office to discuss Mri results

## 2024-04-30 ENCOUNTER — Inpatient Hospital Stay

## 2024-04-30 DIAGNOSIS — F3340 Major depressive disorder, recurrent, in remission, unspecified: Secondary | ICD-10-CM | POA: Diagnosis not present

## 2024-05-03 ENCOUNTER — Other Ambulatory Visit: Payer: Self-pay | Admitting: Radiology

## 2024-05-03 DIAGNOSIS — D696 Thrombocytopenia, unspecified: Secondary | ICD-10-CM

## 2024-05-04 DIAGNOSIS — F330 Major depressive disorder, recurrent, mild: Secondary | ICD-10-CM | POA: Diagnosis not present

## 2024-05-04 DIAGNOSIS — J441 Chronic obstructive pulmonary disease with (acute) exacerbation: Secondary | ICD-10-CM | POA: Diagnosis not present

## 2024-05-04 DIAGNOSIS — I700 Atherosclerosis of aorta: Secondary | ICD-10-CM | POA: Diagnosis not present

## 2024-05-04 DIAGNOSIS — E038 Other specified hypothyroidism: Secondary | ICD-10-CM | POA: Diagnosis not present

## 2024-05-04 DIAGNOSIS — I25119 Atherosclerotic heart disease of native coronary artery with unspecified angina pectoris: Secondary | ICD-10-CM | POA: Diagnosis not present

## 2024-05-04 NOTE — H&P (Signed)
 Chief Complaint: Persistent thrombocytopenia and weight loss; referred for image guided bone marrow biopsy for further evaluation  Referring Provider(s): Chang,R  Supervising Physician:   Patient Status: [facility] - Out-pt  History of Present Illness: [name] is a 73 y.o. male ex smoker with past medical history significant for arthritis, COPD, GERD, melanoma/squamous cell carcinoma of skin, mitral valve prolapse, coronary artery disease, CVA, hypothyroidism, as well as persistent thrombocytopenia.  He has also had some unexpected weight loss, lymphadenopathy, decreased appetite, energy loss.  He presents today for image guided bone marrow biopsy for further evaluation.   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 last stress test 2003   Pneumonia 2009   Primary immune deficiency disorder    Tumor cells, benign    lung, left side   Unstable angina (HCC) 10/28/2017    Past Surgical History:  Procedure Laterality Date   APPENDECTOMY  1984   CATARACT EXTRACTION     L and R eye   CHOLECYSTECTOMY  2002   HYDROCELE EXCISION Right 11/04/2016   Procedure: HYDROCELECTOMY ADULT;  Surgeon: Garnette Shack, MD;  Location: [facility] ORS;  Service: Urology;  Laterality: Right;   INGUINAL HERNIA REPAIR Bilateral 11/04/2016   Procedure: HERNIA REPAIR INGUINAL ADULT BILATERAL;  Surgeon: Krystal Russell, MD;  Location: [facility];  Service: General;  Laterality: Bilateral;    INSERTION OF MESH Bilateral 11/04/2016   Procedure: INSERTION OF MESH;  Surgeon: Krystal Russell, MD;  Location: [facility];  Service: General;  Laterality: Bilateral;   JOINT REPLACEMENT     Lt hip   LEFT HEART CATH AND CORONARY ANGIOGRAPHY N/A 10/31/2017   Procedure: LEFT HEART CATH AND CORONARY ANGIOGRAPHY;  Surgeon: Burnard Debby LABOR, MD;  Location: [facility] IN[facility]ASIVE [facility];  Service: Cardiovascular;  Laterality: N/A;   MASTOIDECTOMY  1996   NASAL SEPTUM SURGERY     sinus surgery   PARATHYROIDECTOMY     TOTAL HIP REVISION  06/14/2011   Procedure: TOTAL HIP REVISION;  Surgeon: Dempsey JINNY Sensor;  Location: [facility] OR;  Service: Orthopedics;  Laterality: Left;  Left Acetabular  Hip Revision    Allergies: Bupropion , Ciprofloxacin -dexamethasone , Gabapentin , Ciprofloxacin  hcl, and Clonidine hcl  Medications: Prior to Admission medications   Medication Sig Start Date End Date Taking? Authorizing Provider  albuterol  (VENTOLIN  HFA) 108 (90 Base) MCG/ACT inhaler Inhale 2 puffs into the lungs every 6 (six) hours as needed for wheezing or shortness of breath. 12/01/22   Meade Verdon RAMAN, MD  Artificial Saliva (ACT DRY MOUTH) LOZG 1 tablet Mouth/Throat 3-4 times day as needed    [provider]  aspirin  81 MG chewable tablet Chew 81 mg by mouth daily.    [provider]  Budeson-Glycopyrrol-Formoterol (BREZTRI  AEROSPHERE) 160-9-4.8 MCG/ACT AERO Inhale 2 puffs into the lungs in the morning and at bedtime. 03/21/23  Olalere, Adewale A, MD  celecoxib (CELEBREX) 200 MG capsule Take 200 mg by mouth daily. 03/04/20   [provider]  cetirizine (ZYRTEC) 10 MG chewable tablet Chew by mouth.    [provider]  divalproex (DEPAKOTE) 250 MG DR tablet Take 250 mg by mouth 2 (two) times daily. Takes two tablets    [provider]  Docusate Sodium  (DSS) 100 MG CAPS Take by mouth.    [provider]  donepezil (ARICEPT) 10 MG tablet Take half tablet (5 mg) daily for 2 weeks, then  increase to the full tablet at 10 mg daily 04/26/24   Wertman, Sara E, PA-C  eszopiclone (LUNESTA) 2 MG TABS tablet Take 2 mg by mouth at bedtime as needed for sleep. Take immediately before bedtime    [provider]  famotidine  (PEPCID ) 20 MG tablet Take 1 tablet (20 mg total) by mouth at bedtime. 09/09/15   Abrol, Nayana, MD  levothyroxine (SYNTHROID) 50 MCG tablet 2 tablets Orally Once a day; Duration: 90 days    [provider]  Magnesium  200 MG TABS Magnesium  gluconate Patient taking differently: Take 500 mg by mouth 1 day or 1 dose. Magnesium  gluconate    [provider]  methylphenidate  (RITALIN ) 10 MG tablet 1 tablet on empty stomach Patient taking differently: Take 10 mg by mouth 3 (three) times daily with meals. 06/23/21   [provider]  metoprolol  succinate (TOPROL -XL) 25 MG 24 hr tablet Take 1 tablet (25 mg total) by mouth daily. 12/26/23   Nahser, Aleene PARAS, MD  mirtazapine (REMERON) 30 MG tablet Take 30 mg by mouth at bedtime.    [provider]  Multiple Vitamin (MULTIVITAMIN) tablet Take 1 tablet by mouth daily.    [provider]  nitroGLYCERIN  (NITROSTAT ) 0.4 MG SL tablet Place 1 tablet (0.4 mg total) under the tongue every 5 (five) minutes as needed for chest pain. 03/09/23   Nahser, Aleene PARAS, MD  pantoprazole  (PROTONIX ) 40 MG tablet Take 40 mg by mouth 2 (two) times daily. 04/21/23   [provider]  polyethylene glycol (MIRALAX  / GLYCOLAX ) packet Take 17 g by mouth daily as needed (for constipation.).     [provider]  Probiotic Product (PROBIOTIC DAILY) CAPS Pre/Probiotic Orally once a day    [provider]  rosuvastatin  (CRESTOR ) 10 MG tablet TAKE 1 TABLET(10 MG) BY MOUTH DAILY 05/03/19   Nahser, Aleene PARAS, MD  sertraline (ZOLOFT) 100 MG tablet Take by mouth. 12/22/23   [provider]  tamsulosin  (FLOMAX ) 0.4 MG CAPS capsule Take 0.4 mg by mouth daily after breakfast.     [provider]     Family History  Problem Relation Age of Onset   Heart Problems Mother    Lung cancer Father 32       smoked   Heart Problems Father    Neuropathy Sister    Cancer Brother        prostate   Lupus Daughter    Healthy Daughter    Multiple sclerosis Son     Social History   Socioeconomic History   Marital status: Married    Spouse name: [name]   Number of children: 3   Years of education: masters   Highest education level: Master's degree (e.g., MA, MS, MEng, MEd, MSW, MBA)  Occupational History    Comment: Sales   Occupation: Retired  Tobacco Use   Smoking status: Former    Current packs/day: 0.00  Average packs/day: 1.5 packs/day for 24.0 years (36.0 ttl pk-yrs)    Types: Cigarettes    Start date: 2    Quit date: 07/05/1985    Years since quitting: 38.8    Passive exposure: Never   Smokeless tobacco: Never  Vaping Use   Vaping status: Never Used  Substance and Sexual Activity   Alcohol use: Yes    Comment: occ   Drug use: No   Sexual activity: Not on file  Other Topics Concern   Not on file  Social History Narrative   Patient lives at home with his wife Leta) Patient is Transport planner. Patient has his masters.Right handed.Caffeine - one cup daily.   One level home   Right handed   Retired   3 children 7 grandkids   Social Drivers of Corporate Investment Banker Strain: Not on file  Food Insecurity: No Food Insecurity (03/26/2024)   Hunger Vital Sign    Worried About Running Out of Food in the Last Year: Never true    Ran Out of Food in the Last Year: Never true  Transportation Needs: No Transportation Needs (03/26/2024)   PRAPARE - Administrator, Civil Service (Medical): No    Lack of Transportation (Non-Medical): No  Physical Activity: Not on file  Stress: Not on file  Social Connections: Not on file       Review of Systems: denies fever,HA,CP,dyspnea, cough, abd pain,N/V or bleeding; he does have some chronic  neck/back pain  Vital Signs: Vitals:   05/07/24 0853  BP: 127/74  Pulse: (!) 54  Resp: 18  Temp: 98.4 F (36.9 C)  SpO2: 99%     Advance Care Plan: no documents on file  Physical Exam; awake/alert; chest- CTA bilat; heart- sl bradycardic rate, nl rhythm- distant heart sounds; abd- soft,+BS,NT; no LE edema  Imaging: MR BRAIN WO CONTRAST Result Date: 04/25/2024 EXAM: MRI BRAIN WITHOUT CONTRAST 04/23/2024 09:23:54 AM TECHNIQUE: Multiplanar multisequence MRI of the head/brain was performed without the administration of intravenous contrast. COMPARISON: MRI head 12/24/2023. CLINICAL HISTORY: Memory impairment. Reports TIA approximately 10 months ago. FINDINGS: BRAIN AND VENTRICLES: There is no evidence of an acute infarct, intracranial hemorrhage, mass, midline shift, hydrocephalus, or extra-axial fluid collection. There is mild generalized cerebral atrophy. Scattered small T2 hyperintensities in the cerebral white matter bilaterally are unchanged from the prior MRI and are nonspecific but compatible with mild chronic small vessel ischemic disease. Mild T2 FLAIR hyperintensity involving cortex at the posterior aspect of the right insula and sylvian fissure has in retrospect been present since 2017 and is without significant progression, and there may be associated slight cortical thickening and gray-white junction blurring. Major intracranial vascular flow voids are preserved. ORBITS: Bilateral cataract extraction. SINUSES AND MASTOIDS: Left mastoidectomy. Minimal mucosal thickening in the paranasal sinuses. BONES AND SOFT TISSUES: Normal marrow signal. No acute soft tissue abnormality. IMPRESSION: 1. No acute intracranial abnormality. 2. Subtle cortical FLAIR hyperintensity at the posterior aspect of the right insula/sylvian fissure, in retrospect unchanged since 2017 and potentially reflecting focal cortical dysplasia. 3. Mild generalized cerebral atrophy and mild chronic small vessel ischemic  disease. Electronically signed by: Dasie Hamburg MD 04/25/2024 12:28 PM EDT RP Workstation: [id number]    Labs:  CBC: Recent Labs    08/15/23 1122 03/26/24 1130  WBC 5.4 5.6  HGB 15.1 14.0  HCT 45.0 40.1  PLT 121* 118*    COAGS: Recent Labs    03/26/24 1131  INR 1.0  APTT 33  BMP: Recent Labs    08/15/23 1122 03/26/24 1130  NA 140 140  K 4.3 4.7  CL 105 105  CO2 25 32  GLUCOSE 135* 89  BUN 24* 26*  CALCIUM  9.2 8.8*  CREATININE 1.11 0.99  GFRNONAA >60 >60    LIVER FUNCTION TESTS: Recent Labs    08/15/23 1122 03/26/24 1130  BILITOT 0.7 0.4  AST 30 29  ALT 34 27  ALKPHOS 56 59  PROT 6.1* 6.1*  ALBUMIN 3.5 4.1    TUMOR MARKERS: No results for input(s): AFPTM, CEA, CA199, CHROMGRNA in the last 8760 hours.  Assessment and Plan: 73 y.o. male ex smoker with past medical history significant for arthritis, COPD, GERD, melanoma/squamous cell carcinoma of skin, mitral valve prolapse, coronary artery disease, CVA, hypothyroidism, as well as persistent thrombocytopenia.  He has also had some unexpected weight loss, lymphadenopathy, decreased appetite, energy loss.  He presents today for image guided bone marrow biopsy for further evaluation.Risks and benefits of procedure was discussed with the patient  including, but not limited to bleeding, infection, damage to adjacent structures or low yield requiring additional tests.  All of the questions were answered and there is agreement to proceed.  Consent signed and in chart.    Thank you for allowing our service to participate in [name] 's care.  Electronically Signed: D. Franky Rakers, PA-C   05/04/2024, 3:52 PM      I spent a total of 20 minutes    in face to face in clinical consultation, greater than 50% of which was counseling/coordinating care for image guided bone marrow biopsy

## 2024-05-07 ENCOUNTER — Ambulatory Visit (HOSPITAL_COMMUNITY)
Admission: RE | Admit: 2024-05-07 | Discharge: 2024-05-07 | Disposition: A | Payer: PRIVATE HEALTH INSURANCE | Source: Ambulatory Visit

## 2024-05-07 ENCOUNTER — Other Ambulatory Visit: Payer: Self-pay

## 2024-05-07 ENCOUNTER — Encounter (HOSPITAL_COMMUNITY): Payer: Self-pay

## 2024-05-07 DIAGNOSIS — E039 Hypothyroidism, unspecified: Secondary | ICD-10-CM | POA: Diagnosis not present

## 2024-05-07 DIAGNOSIS — Z1379 Encounter for other screening for genetic and chromosomal anomalies: Secondary | ICD-10-CM | POA: Insufficient documentation

## 2024-05-07 DIAGNOSIS — I2510 Atherosclerotic heart disease of native coronary artery without angina pectoris: Secondary | ICD-10-CM | POA: Insufficient documentation

## 2024-05-07 DIAGNOSIS — R5383 Other fatigue: Secondary | ICD-10-CM | POA: Diagnosis not present

## 2024-05-07 DIAGNOSIS — D696 Thrombocytopenia, unspecified: Secondary | ICD-10-CM | POA: Diagnosis not present

## 2024-05-07 DIAGNOSIS — Z8673 Personal history of transient ischemic attack (TIA), and cerebral infarction without residual deficits: Secondary | ICD-10-CM | POA: Insufficient documentation

## 2024-05-07 DIAGNOSIS — J449 Chronic obstructive pulmonary disease, unspecified: Secondary | ICD-10-CM | POA: Insufficient documentation

## 2024-05-07 DIAGNOSIS — R634 Abnormal weight loss: Secondary | ICD-10-CM | POA: Insufficient documentation

## 2024-05-07 DIAGNOSIS — I341 Nonrheumatic mitral (valve) prolapse: Secondary | ICD-10-CM | POA: Diagnosis not present

## 2024-05-07 DIAGNOSIS — R799 Abnormal finding of blood chemistry, unspecified: Secondary | ICD-10-CM | POA: Diagnosis not present

## 2024-05-07 LAB — CBC WITH DIFFERENTIAL/PLATELET
Abs Immature Granulocytes: 0.02 K/uL (ref 0.00–0.07)
Basophils Absolute: 0 K/uL (ref 0.0–0.1)
Basophils Relative: 0 %
Eosinophils Absolute: 0.1 K/uL (ref 0.0–0.5)
Eosinophils Relative: 2 %
HCT: 41.1 % (ref 39.0–52.0)
Hemoglobin: 13.5 g/dL (ref 13.0–17.0)
Immature Granulocytes: 0 %
Lymphocytes Relative: 29 %
Lymphs Abs: 1.5 K/uL (ref 0.7–4.0)
MCH: 31.5 pg (ref 26.0–34.0)
MCHC: 32.8 g/dL (ref 30.0–36.0)
MCV: 96 fL (ref 80.0–100.0)
Monocytes Absolute: 0.6 K/uL (ref 0.1–1.0)
Monocytes Relative: 11 %
Neutro Abs: 2.9 K/uL (ref 1.7–7.7)
Neutrophils Relative %: 58 %
Platelets: 92 K/uL — ABNORMAL LOW (ref 150–400)
RBC: 4.28 MIL/uL (ref 4.22–5.81)
RDW: 12 % (ref 11.5–15.5)
Smear Review: NORMAL
WBC: 5.1 K/uL (ref 4.0–10.5)
nRBC: 0 % (ref 0.0–0.2)

## 2024-05-07 MED ORDER — MIDAZOLAM HCL 2 MG/2ML IJ SOLN
INTRAMUSCULAR | Status: AC
  Filled 2024-05-07: qty 4

## 2024-05-07 MED ORDER — FENTANYL CITRATE (PF) 100 MCG/2ML IJ SOLN
INTRAMUSCULAR | Status: AC
  Filled 2024-05-07: qty 2

## 2024-05-07 MED ORDER — SODIUM CHLORIDE 0.9 % IV SOLN: INTRAVENOUS | Status: DC

## 2024-05-07 NOTE — Discharge Instructions (Signed)

## 2024-05-07 NOTE — Procedures (Signed)
 Pre procedural Dx: thrombocytopenia  Post procedural Dx: Same  Technically successful CT guided biopsy of right iliac bone marrow   EBL: None.   Complications: None immediate.   KANDICE Banner, MD Pager #: [phone]

## 2024-05-09 LAB — SURGICAL PATHOLOGY

## 2024-05-10 ENCOUNTER — Ambulatory Visit: Payer: Self-pay

## 2024-05-13 NOTE — Progress Notes (Signed)
 [facility] OFFICE PROGRESS NOTE  Patient Care Team: Espinoza, Alejandra, DO as PCP - General (Family Medicine) Nahser, Aleene PARAS, MD (Inactive) as PCP - Cardiology (Cardiology)  73 y.o.male with history of early-stage melanoma, CAD, CVA, emphysema, GERD, hypothyroidism referred to Hematology for thrombocytopenia.   Imaging showed no malignancy from CT scan from Neck to pelvis. Bone marrow biopsy showed normocellular bone marrow (30%) with trilineage hematopoiesis.   Discussed results.  No concerning findings to suggest malignancy.  Watch for signs of severe thrombocytopenia can occur anytime, especially if having ITP.  Report to ED if signs of petechiae and spontaneous bruising or bleeding.  Will follow-up as needed.   Pauletta JAYSON Chihuahua, MD  INTERVAL HISTORY: Patient returns for follow-up.  No changes since last visit.  Here to discuss bone marrow biopsy results.   PHYSICAL EXAMINATION: ECOG PERFORMANCE STATUS: 2  Vitals:   05/14/24 1509  BP: 130/66  Pulse: 71  Resp: 17  Temp: 98.2 F (36.8 C)  SpO2: 98%   Filed Weights   05/14/24 1509  Weight: 169 lb 3.2 oz (76.7 kg)    GENERAL: alert, no distress and comfortable SKIN: skin color normal and no jaundice  Relevant data reviewed during this visit included lab and bone marrow biopsy results.

## 2024-05-14 ENCOUNTER — Inpatient Hospital Stay

## 2024-05-14 ENCOUNTER — Ambulatory Visit

## 2024-05-14 VITALS — BP 130/66 | HR 71 | Temp 98.2°F | Resp 17 | Ht 70.0 in | Wt 169.2 lb

## 2024-05-14 DIAGNOSIS — D696 Thrombocytopenia, unspecified: Secondary | ICD-10-CM | POA: Insufficient documentation

## 2024-05-15 ENCOUNTER — Encounter: Payer: Self-pay | Admitting: Pulmonary Disease

## 2024-05-15 ENCOUNTER — Ambulatory Visit: Payer: Self-pay | Admitting: Internal Medicine

## 2024-05-15 ENCOUNTER — Encounter (HOSPITAL_COMMUNITY): Payer: Self-pay

## 2024-05-15 ENCOUNTER — Ambulatory Visit: Admitting: Pulmonary Disease

## 2024-05-15 VITALS — BP 136/68 | HR 63 | Ht 70.0 in | Wt 167.6 lb

## 2024-05-15 DIAGNOSIS — J432 Centrilobular emphysema: Secondary | ICD-10-CM | POA: Diagnosis not present

## 2024-05-15 MED ORDER — PREDNISONE 20 MG PO TABS: 20.0000 mg | ORAL_TABLET | Freq: Every day | ORAL | 0 refills | Status: DC

## 2024-05-15 MED ORDER — DOXYCYCLINE HYCLATE 100 MG PO TABS: 100.0000 mg | ORAL_TABLET | Freq: Two times a day (BID) | ORAL | 0 refills | Status: DC

## 2024-05-15 NOTE — Telephone Encounter (Signed)
 FYI Only or Action Required?: FYI only for provider: appointment scheduled on 05/15/24.  Patient is followed in Pulmonology for COPD, last seen on 11/18/2023 by Malachy Comer GAILS, NP.  Called Nurse Triage reporting Shortness of Breath.  Symptoms began several months ago.  Interventions attempted: Maintenance inhaler.  Symptoms are: gradually worsening.  Triage Disposition: See Physician Within 24 Hours (overriding See HCP Within 4 Hours (Or PCP Triage))  Patient/caregiver understands and will follow disposition?: Yes                            Copied from CRM #[id number]. Topic: Clinical - Red Word Triage >> May 15, 2024  9:28 AM Ismael A wrote: Red Word that prompted transfer to Nurse Triage: experiencing increased SOB and weakness - gets sob walking around, sometimes get lightheaded - patient was wheezing in his sleep  Reason for Disposition  [1] Longstanding difficulty breathing (e.g., CHF, COPD, emphysema) AND [2] WORSE than normal  Answer Assessment - Initial Assessment Questions E2C2 Pulmonary Triage - Initial Assessment Questions  1. Chief Complaint (e.g., cough, sob, wheezing, fever, chills, sweat or additional symptoms) *Go to specific symptom protocol after initial questions. Worsening SOB   2. How long have symptoms been present? Past 6 months, worsening   3. Have you used your inhalers/maintenance medication? If yes, What medications? Breztri  as prescribed, albuterol - states he has not had to use    4. Do you wear supplemental oxygen ? If yes, How many liters are you supposed to use? Denies   5. Do you monitor your oxygen  levels? If yes, What is your reading (oxygen  level) today? 99%  1. RESPIRATORY STATUS: Describe your breathing? (e.g., wheezing, shortness of breath, unable to speak, severe coughing)      Worsening SOB upon exertion 2. ONSET: When did this breathing problem begin?      Worsening over past 6 months 3.  PATTERN Does the difficult breathing come and go, or has it been constant since it started?      Comes upon exertion, denies at rest 4. SEVERITY: How bad is your breathing? (e.g., mild, moderate, severe)      Mild, patient able to speak in clear and complete sentences while on phone with this RN 5. RECURRENT SYMPTOM: Have you had difficulty breathing before? If Yes, ask: When was the last time? and What happened that time?      Chronic due to COPD 6. CARDIAC HISTORY: Do you have any history of heart disease? (e.g., heart attack, angina, bypass surgery, angioplasty)      CAD  7. LUNG HISTORY: Do you have any history of lung disease?  (e.g., pulmonary embolus, asthma, emphysema)     COPD 8. CAUSE: What do you think is causing the breathing problem?      Unsure 9. OTHER SYMPTOMS: Do you have any other symptoms? (e.g., chest pain, cough, dizziness, fever, runny nose)     Nasal congestion, occasional cough, weakness/fatigue worsening over past 6 months, wife reports wheezing at night during sleep, denies numbness/tingling, reports occasional sharp pains in chest- states he is being closely monitored by cardiology  Protocols used: Breathing Difficulty-A-AH

## 2024-05-15 NOTE — Patient Instructions (Addendum)
 Referral to pulmonary rehab  Prescription for doxycycline  for 10 days Prednisone  20 mg daily for 10 days  Increase protein intake  Regular exercise as tolerated  Your recent breathing study was stable on 11/18/2023 compared to 2023  Your recent CT scan of the chest does not show any significant abnormality in the lungs  Continue using Breztri  on a regular basis

## 2024-05-15 NOTE — Progress Notes (Signed)
 [name]    [id number]    1950/11/20  Primary Care Physician:Espinoza, Alejandra, DO  Referring Physician: Espinoza, Alejandra, DO [address] [place],  [place] [address]  Chief complaint:   Patient being seen for worsening shortness of breath  HPI:  He feels his breathing is worsening over the last 5 to 6 months Has lost about 15 pounds although in the last few weeks has gained about 3 back  He has a cough bringing up just clear phlegm No fevers, no chills Known to have COPD, bronchiectasis Compliant with Breztri   Following up with oncology He recently had bone marrow's and was told they did not find any significant abnormality  Had pulmonary rehab about 2 years ago and benefited from it He is interested in pulmonary rehab again  He is on Breztri  and compliant, uses albuterol  as needed  He is on mirtazapine  Outpatient Encounter Medications as of 05/15/2024  Medication Sig   albuterol  (VENTOLIN  HFA) 108 (90 Base) MCG/ACT inhaler Inhale 2 puffs into the lungs every 6 (six) hours as needed for wheezing or shortness of breath.   Artificial Saliva (ACT DRY MOUTH) LOZG 1 tablet Mouth/Throat 3-4 times day as needed   Budeson-Glycopyrrol-Formoterol (BREZTRI  AEROSPHERE) 160-9-4.8 MCG/ACT AERO Inhale 2 puffs into the lungs in the morning and at bedtime.   celecoxib (CELEBREX) 200 MG capsule Take 200 mg by mouth daily.   cetirizine (ZYRTEC) 10 MG chewable tablet Chew by mouth.   Docusate Sodium  (DSS) 100 MG CAPS Take by mouth.   donepezil (ARICEPT) 10 MG tablet Take half tablet (5 mg) daily for 2 weeks, then increase to the full tablet at 10 mg daily   eszopiclone (LUNESTA) 2 MG TABS tablet Take 2 mg by mouth at bedtime as needed for sleep. Take immediately before bedtime   famotidine  (PEPCID ) 20 MG tablet Take 1 tablet (20 mg total) by mouth at bedtime.   levothyroxine (SYNTHROID) 50 MCG tablet 2 tablets Orally Once a day; Duration: 90 days    Magnesium  200 MG TABS Magnesium  gluconate (Patient taking differently: Take 500 mg by mouth 1 day or 1 dose. Magnesium  gluconate)   methylphenidate  (RITALIN ) 10 MG tablet 1 tablet on empty stomach (Patient taking differently: Take 10 mg by mouth 3 (three) times daily with meals.)   metoprolol  succinate (TOPROL -XL) 25 MG 24 hr tablet Take 1 tablet (25 mg total) by mouth daily.   mirtazapine (REMERON) 30 MG tablet Take 30 mg by mouth at bedtime.   Multiple Vitamin (MULTIVITAMIN) tablet Take 1 tablet by mouth daily.   pantoprazole  (PROTONIX ) 40 MG tablet Take 40 mg by mouth 2 (two) times daily.   polyethylene glycol (MIRALAX  / GLYCOLAX ) packet Take 17 g by mouth daily as needed (for constipation.).    Probiotic Product (PROBIOTIC DAILY) CAPS Pre/Probiotic Orally once a day   rosuvastatin  (CRESTOR ) 10 MG tablet TAKE 1 TABLET(10 MG) BY MOUTH DAILY   sertraline (ZOLOFT) 100 MG tablet Take by mouth.   tamsulosin  (FLOMAX ) 0.4 MG CAPS capsule Take 0.4 mg by mouth daily after breakfast.    No facility-administered encounter medications on file as of 05/15/2024.    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 last stress test 2003   Pneumonia 2009   Primary immune deficiency disorder    Tumor cells, benign    lung, left side   Unstable angina (HCC) 10/28/2017    Past Surgical History:  Procedure Laterality Date   APPENDECTOMY  1984   CATARACT EXTRACTION     L and R eye   CHOLECYSTECTOMY  2002   HYDROCELE EXCISION Right 11/04/2016   Procedure: HYDROCELECTOMY ADULT;  Surgeon: Garnette Shack, MD;  Location: [facility];  Service: Urology;  Laterality: Right;   INGUINAL HERNIA REPAIR Bilateral 11/04/2016   Procedure: HERNIA REPAIR INGUINAL ADULT BILATERAL;  Surgeon: Krystal Russell, MD;  Location: [facility];  Service: General;  Laterality: Bilateral;   INSERTION OF MESH Bilateral 11/04/2016   Procedure: INSERTION OF MESH;  Surgeon: Krystal Russell, MD;  Location: [facility];  Service: General;  Laterality: Bilateral;   JOINT REPLACEMENT     Lt hip   LEFT HEART CATH AND CORONARY ANGIOGRAPHY N/A 10/31/2017   Procedure: LEFT HEART CATH AND CORONARY ANGIOGRAPHY;  Surgeon: Burnard Debby LABOR, MD;  Location: [facility];  Service: Cardiovascular;  Laterality: N/A;   MASTOIDECTOMY  1996   NASAL SEPTUM SURGERY     sinus surgery   PARATHYROIDECTOMY     TOTAL HIP REVISION  06/14/2011   Procedure: TOTAL HIP REVISION;  Surgeon: Dempsey JINNY Sensor;  Location: [facility];  Service: Orthopedics;  Laterality: Left;  Left Acetabular  Hip Revision    Family History  Problem Relation Age of Onset   Heart Problems Mother    Lung cancer Father 68       smoked   Heart Problems Father    Neuropathy Sister    Cancer Brother        prostate   Lupus Daughter    Healthy Daughter    Multiple sclerosis Son     Social History   Socioeconomic History   Marital status: Married    Spouse name: Editor, Commissioning   Number of children: 3   Years of education: masters   Highest education level: Master's degree (e.g., MA, MS, MEng, MEd, MSW, MBA)  Occupational History    Comment: Sales    Occupation: Retired  Tobacco Use   Smoking status: Former    Current packs/day: 0.00    Average packs/day: 1.5 packs/day for 24.0 years (36.0 ttl pk-yrs)    Types: Cigarettes    Start date: 1966    Quit date: 07/05/1985    Years since quitting: 38.8    Passive exposure: Never   Smokeless tobacco: Never  Vaping Use   Vaping status: Never Used  Substance and Sexual Activity   Alcohol use: Yes    Comment: occ   Drug use: No   Sexual activity: Not on file  Other Topics Concern  Not on file  Social History Narrative   Patient lives at home with his wife Leta) Patient is Transport planner. Patient has his masters.Right handed.Caffeine - one cup daily.   One level home   Right handed   Retired   3 children 7 grandkids   Social Drivers of Corporate Investment Banker Strain: Not on file  Food Insecurity: No Food Insecurity (03/26/2024)   Hunger Vital Sign    Worried About Running Out of Food in the Last Year: Never true    Ran Out of Food in the Last Year: Never true  Transportation Needs: No Transportation Needs (03/26/2024)   PRAPARE - Administrator, Civil Service (Medical): No    Lack of Transportation (Non-Medical): No  Physical Activity: Not on file  Stress: Not on file  Social Connections: Not on file  Intimate Partner Violence: Not At Risk (03/26/2024)   Humiliation, Afraid, Rape, and Kick questionnaire    Fear of Current or Ex-Partner: No    Emotionally Abused: No    Physically Abused: No    Sexually Abused: No    Review of Systems  Constitutional:  Positive for fatigue and unexpected weight change.  Respiratory:  Positive for shortness of breath.     Vitals:   05/15/24 1457  BP: 136/68  Pulse: 63  SpO2: 97%     Physical Exam Constitutional:      Appearance: Normal appearance.  HENT:     Head: Normocephalic.     Mouth/Throat:     Mouth: Mucous membranes are moist.  Eyes:     General: No scleral icterus. Cardiovascular:     Rate and Rhythm:  Normal rate and regular rhythm.     Heart sounds: No murmur heard.    No friction rub.  Pulmonary:     Effort: No respiratory distress.     Breath sounds: No stridor. Rhonchi present. No wheezing.  Musculoskeletal:     Cervical back: No rigidity or tenderness.  Neurological:     Mental Status: He is alert.  Psychiatric:        Mood and Affect: Mood normal.    Data Reviewed: Recent pulmonary function test from April 2025 was reviewed showing mild restrictive disease with mild reduction in diffusing capacity-stable from previous  Most recent CT scan of the chest 04/02/2024 shows evidence of emphysema, some nodularity  Assessment/Plan: Obstructive lung disease  Restrictive lung disease  Exacerbation of obstructive lung disease - Will call in a course of antibiotics-start doxycycline  Course of steroids  Patient will benefit from pulmonary rehab  Increase protein intake as able  Regular use as able  Follow-up in about 3 months  Encouraged to call with significant concerns    Jennet Epley MD [facility] 05/15/2024, 3:12 PM  CC: Espinoza, Alejandra, DO

## 2024-05-18 ENCOUNTER — Telehealth: Payer: Self-pay | Admitting: Physician Assistant

## 2024-05-18 NOTE — Telephone Encounter (Signed)
 Pt's wife called in this afternoon. Pt is taking a prescription called: donepezil (ARICEPT) 10 MG tablet .  Pt's wife stated that they saw that , this  prescription is bad for the  lungs. That is what they have read. Pt's wife stated that Pt has COPD.They want to know is this drug good for pt. Please call.Thanks

## 2024-05-21 DIAGNOSIS — L821 Other seborrheic keratosis: Secondary | ICD-10-CM | POA: Diagnosis not present

## 2024-05-21 DIAGNOSIS — Z85828 Personal history of other malignant neoplasm of skin: Secondary | ICD-10-CM | POA: Diagnosis not present

## 2024-05-21 DIAGNOSIS — Z08 Encounter for follow-up examination after completed treatment for malignant neoplasm: Secondary | ICD-10-CM | POA: Diagnosis not present

## 2024-05-21 DIAGNOSIS — D1801 Hemangioma of skin and subcutaneous tissue: Secondary | ICD-10-CM | POA: Diagnosis not present

## 2024-05-21 DIAGNOSIS — L814 Other melanin hyperpigmentation: Secondary | ICD-10-CM | POA: Diagnosis not present

## 2024-05-21 DIAGNOSIS — Z872 Personal history of diseases of the skin and subcutaneous tissue: Secondary | ICD-10-CM | POA: Diagnosis not present

## 2024-05-21 NOTE — Telephone Encounter (Signed)
 Thank me for calling. Verbally understood it was up to him if he wanted to take it.

## 2024-05-22 ENCOUNTER — Telehealth (HOSPITAL_COMMUNITY): Payer: Self-pay

## 2024-05-22 NOTE — Telephone Encounter (Signed)
 Pt insurance is active and benefits verified through Medicare B. Co-pay $0, DED $257/unknown met, out of pocket $0/$0 met, co-insurance 20%. No pre-authorization required.   2ndary insurance is active and benefits verified through [employer]. Co-pay $0, DED $0/$0 met, out of pocket $0/$0 met, co-insurance 0%. No pre-authorization required.

## 2024-05-23 ENCOUNTER — Telehealth (HOSPITAL_COMMUNITY): Payer: Self-pay

## 2024-05-23 NOTE — Telephone Encounter (Signed)
 [name]

## 2024-05-23 NOTE — Telephone Encounter (Signed)
 Pt returned call. Scheduled for Pulm Rehab orientation appt for 11/24 at 9am. Pt will exercise in the 8:15am class. MyChart message sent

## 2024-05-23 NOTE — Telephone Encounter (Signed)
 LVM for pt to schedule for [facility]

## 2024-05-24 ENCOUNTER — Ambulatory Visit: Admitting: Internal Medicine

## 2024-05-24 ENCOUNTER — Telehealth: Payer: Self-pay | Admitting: Cardiovascular Disease

## 2024-05-24 ENCOUNTER — Ambulatory Visit: Payer: Self-pay | Admitting: Pulmonary Disease

## 2024-05-24 ENCOUNTER — Encounter: Payer: Self-pay | Admitting: Internal Medicine

## 2024-05-24 VITALS — BP 116/70 | HR 62 | Temp 98.5°F | Ht 71.5 in | Wt 170.4 lb

## 2024-05-24 DIAGNOSIS — R634 Abnormal weight loss: Secondary | ICD-10-CM | POA: Diagnosis not present

## 2024-05-24 DIAGNOSIS — R058 Other specified cough: Secondary | ICD-10-CM

## 2024-05-24 DIAGNOSIS — R5382 Chronic fatigue, unspecified: Secondary | ICD-10-CM | POA: Diagnosis not present

## 2024-05-24 DIAGNOSIS — R7689 Other specified abnormal immunological findings in serum: Secondary | ICD-10-CM | POA: Diagnosis not present

## 2024-05-24 MED ORDER — PREDNISONE 10 MG PO TABS: ORAL_TABLET | ORAL | 0 refills | Status: AC

## 2024-05-24 NOTE — Patient Instructions (Signed)
 ICD-10-CM   1. Post-viral cough syndrome  R05.8     2. Elevated rheumatoid factor  R76.89     3. Chronic fatigue  R53.82     4. Unintentional weight loss  R63.4       REdo prednisone  as follows  - Please take Take prednisone  40mg  once daily x 3 days, then 30mg  once daily x 3 days, then 20mg  once daily x 3 days, then prednisone  10mg  once daily  x 3 days and then prednisone  5mg  daily x 3 days and  stop   Some 2-4  weeks after completing prednisone  - 4 weeks from 05/24/2024  - do ANA, RF, CCP, Myositis panel, CK, ALdolase, ESR, Quantiferon Gold   - do RAST allergy pan   Followup  - late Jan 2026 / early FEb 2026 with APP or DR Geronimo

## 2024-05-24 NOTE — Telephone Encounter (Signed)
 Recommend stop Metoprolol . Monitor heart rate once per day for a week and report back readings.   He can see PCP in follow up or be scheduled for cardiology visit with APP at [facility] or [facility]. Whichever is his preference.   Yoselin Amerman S Djuna Frechette, NP

## 2024-05-24 NOTE — Telephone Encounter (Signed)
 STAT if HR is under 50 or over 120 (normal HR is 60-100 beats per minute)  What is your heart rate? 45HR   Do you have a log of your heart rate readings (document readings)? No   Do you have any other symptoms? Lightheaded, weak   Call transferred to triage.

## 2024-05-24 NOTE — Telephone Encounter (Signed)
 Received incoming STAT call into Triage. Pt c/o HR=45. BP today was around 105/73. He has a BP/HR monitor that is connected to his PCP's office and they are the ones that called him to alert him of the low HR. Pt was sitting on the couch watching TV this morning. As we spoke he rechecked HR and it was back up to 64-66. His normal HR is around 60 bpm. The weakness and lightheadedness is NOT new, and has worsened over the last 3 months. He is currently being treated for a COPD exacerbation with Prednisone  and Doxycyline. He will see Pulmonary today. He c/o SOB, starts Pulmonary Rehab in early December.   He has not worn a heart monitor in the last two years. He takes Toprol  XL 12.5 mg once daily and is on Aspirin  81 once daily (states an MRI showed a mini stroke a few months ago and provider started daily Aspirin ).  Advised pt to stay hydrated and ER precautions reviewed; pt verbalized understanding.

## 2024-05-24 NOTE — Progress Notes (Signed)
 OV 10/29/2019  Subjective:  Patient ID: [name], male , DOB: 1950/10/13 , age 73 y.o. , MRN: [id number] , ADDRESS: [address] [place] [address]   10/29/2019 -   Chief Complaint  Patient presents with   Follow-up    Pt saw Dr. Shellia 3/23 and Dr. Shellia wanted pt to f/u with MR.  Pt states he has been doing good since last visit. Denies any current complaints with breathing.      HPI KOTA CIANCIO 73 y.o. -has been referred to the ILD center.  For evaluation of his interstitial lung disease.  He is a former Information Systems Manager.  He tells me that a few years ago he is to be able to walk 3 miles but now has restricted himself to 1-> 1-1/2 miles.  His work-up resulted in a positive rheumatoid factor documented below.  He was referred to rheumatology.  He does not recollect who he saw but he tells me that there is no evidence of rheumatoid arthritis but he has osteoarthritis.  He is a previous smoker having quit 30 years ago.  But more than 30 pack smoking history.  He had a high-resolution CT chest that has both upper lobe and lower lobe findings.  The lower lobe findings are indeterminate for UIP.  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  There is also associated emphysema.  I personally visualized the images.  He had pulmonary function test that shows restriction with low DLCO.   [facility] Comprehensive ILD Questionnaire - he did one and gave it to NP in feb 2021 who then gave to CMA but currently cannot find it. So, he agreed to do a redo . This is pending currently  Symptoms:  See below   SYMPTOM SCALE - ILD 10/29/2019   O2 use ra  Shortness of Breath 0 -> 5 scale with 5 being worst (score 6 If unable to do)  At rest 1  Simple tasks - showers, clothes change, eating, shaving 1  Household (dishes, doing bed, laundry) 2  Shopping 1.5  Walking level at own pace 1  Walking up Stairs 2  Total (30-36) Dyspnea Score 8.5   How bad is your cough? 0  How bad is your fatigue 1  How bad is nausea 0  How bad is vomiting?  0  How bad is diarrhea? 0  How bad is anxiety? 0  How bad is depression 0        Past Medical History :  GERD severe x 40 yers,  Pnuemonia x 3. First as teenager. Heart disease nos x  On 09/03/2020: He said he had confirmed Covid in January 2020 at the onset of the pandemic.  I reclarify this with him.  We do not have testing till the spring 2020 but he says he had confirmed Covid.  I do not see evidence of this in the record.  He did have influenza A in March 2017  ROS:   Faitgued, arthralia x few years with spine stiffness, dysphiag but not in few years.  Does have occasional heartburn for the last several decades.  Is also had oral ulcers on and off occasionally  FAMILY HISTORY of LUNG DISEASE:  -He smoked cigarettes weekly 1967 in 1987 to 30 cigarettes a day.  He smoked marijuana between 1970 1980 frequently in the 1970s.  Never use cocaine abuse intravenous drug use.   EXPOSURE HISTORY:  -Currently  [place] for last 4 years.  Age of the home is 25 years.  At an apartment.  Prior to that lived in her own single-family home.  The last 4 years note dampness.  Previously there was mildew that is being replaced.  He does have a humidifier but he cleans it with vinegar.  No CPAP use no nebulizer use.  No C-minus.  No junk see no misting Fountain.  No pet birds or parakeets.  No pet gerbils or hamsters.  Does not use feather pillows or duvet.  No mold in the [facility] duct.  No music habits.  No guarding habits.  Results for KOU, GUCCIARDO (MRN [id number]) as of 09/03/2020 11:55  Ref. Range 07/17/2019 14:21  A. Pullulans Abs Latest Ref Range: Negative  Positive (A)   HOME and HOBBY DETAILS :   -  OCCUPATIONAL HISTORY (122 questions) : Work in naval architect.  Worked as a location manager.  Otherwise negative.  Does not work in a dusty environment.  Never been exposed to  fumes.    PULMONARY TOXICITY HISTORY (27 items): Prednisone  routine December January 2021    Simple office walk 185 feet x  3 laps goal with forehead probe 10/29/2019   O2 used ra  Number laps completed 3  Comments about pace moderate  Resting Pulse Ox/HR 99% and 80/min  Final Pulse Ox/HR 99% and 93/min  Desaturated </= 88% no  Desaturated <= 3% points no  Got Tachycardic >/= 90/min yes  Symptoms at end of test Mild dyspena  Miscellaneous comments x    IMPRESSION: HRCT Mediastinum/Nodes: No enlarged mediastinal, hilar, or axillary lymph nodes. Predominantly fat containing hiatal hernia. Thyroid  gland, trachea, and esophagus demonstrate no significant findings.   Lungs/Pleura: There is mild, tubular bronchiectasis and mild bronchial wall thickening throughout, with very fine, predominantly peripheral centrilobular nodularity. There is additional, minimal bibasilar irregular interstitial opacity, which is slightly increased in comparison to prior examination dated 12/29/2015. Minimal underlying paraseptal emphysema. No significant air trapping on expiratory phase imaging. There are multiple small bilateral pulmonary nodules measuring up to 6 mm and most numerous in the lung apices, all stable compared to prior examination dated 12/29/2015 and benign. No pleural effusion or pneumothorax.  1. Mild, tubular bronchiectasis and bronchial wall thickening throughout, with very fine, predominantly peripheral centrilobular nodularity in the upper lungs, similar to prior examination. There is additional, minimal bibasilar irregular interstitial opacity, which is slightly increased in comparison to prior examination dated 12/29/2015. These findings are of uncertain significance, and to some extent very likely reflect sequelae of smoking, particularly in the upper lungs. Findings in the lung bases are nonspecific and may reflect bland infectious or inflammatory scarring or  alternately early interstitial lung disease in an indeterminate for UIP pattern by ATS pulmonary fibrosis criteria. Consider annual CT follow-up to assess for stability of fibrotic findings and pattern if fibrotic interstitial lung disease remains suspected. 2. Minimal paraseptal emphysema.  Emphysema (ICD10-J43.9). 3. Multiple small bilateral pulmonary nodules measuring up to 6 mm and most numerous in the lung apices, all stable compared to prior examination dated 12/29/2015 and benign. 4. Coronary artery disease.  Aortic Atherosclerosis (ICD10-I70.0).     Electronically Signed   By: Marolyn Jaksch M.D.   On: 08/30/2019 14:12    Results for SADAO, WEYER (MRN [id number]) as of 10/29/2019 09:46  Ref. Range 09/25/2019 16:01  FVC-Pre Latest Units: L 3.15  FVC-%Pred-Pre Latest Units: % 65  FEV1-Pre Latest Units:  L 2.63  FEV1-%Pred-Pre Latest Units: % 73  Pre FEV1/FVC ratio Latest Units: % 83   Results for SPIKE, DESILETS (MRN [id number]) as of 10/29/2019 09:46  Ref. Range 09/25/2019 16:01  DLCO cor Latest Units: ml/min/mmHg 21.32  DLCO cor % pred Latest Units: % 76   Results for JENESIS, SUCHY (MRN [id number]) as of 09/03/2020 11:55  Ref. Range 07/17/2019 14:21  A. Pullulans Abs Latest Ref Range: Negative  Positive (A)   ROS - per HPI  Results for KOURTLAND, COOPMAN (MRN [id number]) as of 10/29/2019 09:46  Ref. Range 10/24/2007 20:39 09/05/2015 12:13 02/26/2019 15:23 07/17/2019 14:21 07/20/2019 14:55 07/20/2019 14:55 08/17/2019 15:17 10/08/2019 10:11  Anti Nuclear Antibody (ANA) Latest Ref Range: Negative    Negative       Angiotensin-Converting Enzyme Latest Ref Range: 9 - 67 U/L    43      Cyclic Citrullin Peptide Ab Latest Units: UNITS        <16  CCP Antibodies IgG/IgA Latest Ref Range: 0 - 19 units  3        ds DNA Ab Latest Ref Range: 0 - 9 IU/mL  <1        RA Latex Turbid. Latest Ref Range: 0.0 - 13.9 IU/mL  36.8 (H)  575 (H)   206.6 (H)   IgG (Immunoglobin G),  Serum Latest Ref Range: 694 - 1618 mg/dL 346 (L)         IgA Latest Ref Range: 68 - 378 mg/dL 859         Scleroderma (Scl-70) (ENA) Antibody, IgG Latest Ref Range: 0.0 - 0.9 AI  <0.2           OV 12/10/2019 - telephone visit.  Patient identified with 2 person identified.  Wife also came on the phone call.   Subjective:  Patient ID: [name], male , DOB: 06/15/1951 , age 32 y.o. , MRN: [id number] , ADDRESS: [address] [place] [address] FUI   ILD - indeterminate CT with positive RF  Associated emphysema   12/10/2019 -  No chief complaint on file.  S: doing ok .  Regarding cough:  Cough abated. Not using inahler . Went away after starting spiriva  and in a few days resolved. Then stopped spiriva  and cough has not recurred. HE is wondering why. Also spiriva  costs $220/month. Friend has budesonide /formeterol . I explained this that this is another class. 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  Beyond this we do not have further testing to suggest prognosis.  Therefore biopsy is recommended.  We discussed bronchoscopy as first step with lavage and transbronchial biopsy for RNA genomic analysis.  This case would be under moderate sedation slight/anesthesia versus surgical lung biopsy which would be the gold standard.  Very long discussion about this.  Risks, benefits and limitations explained.  He is going to think about all this do some individual research and call back.    03/25/2020  - Visit  73 year old  male former smoker followed in our office  for chronic respiratory failure and COPD.  Followed by Dr. Geronimo patient was last seen in our office on 02/13/2020.  He was seen by TP NP at that office visit is recommended the patient be treated with Augmentin , prednisone , add Yupelri  nebulized medication, obtain pulmonary function testing.   Patient reports that he is feeling significantly better since last being treated in August/2021.  He feels he is back to his baseline.  He is not still currently using Yupelri .  He is unsure if this actually helped him.  He reports it is difficult for him to assess given the fact that he was being acutely treated as a COPD exacerbation while he was trialed on it.  Patient would like to review most recent pulmonary function test today.  He reports that most recent plan as discussed with him by Dr. Geronimo was to repeat a CT scan next year to follow the February/2021 HRCT.   08/05/2020 - Interim hx Patient contacted today to review recent PFTs. He is doing well today. He had a cough x1 month with associated wheezing for 5 days. His cough has mostly resolved. He still has some shortness of breath. He stopped using yupeli. He did not find this helpful. In the past insurance has not covered Symbicort and is not covering Albuterol . Pulmonary function testing appears stable. FENO was mildly elevated at 30. He has a follow-up HRCT in February.     Significant tests:  02/13/2020-chest x-ray-no active cardiopulmonary disease  03/11/2020-pulmonary function test-FVC 3.17 (65% predicted), ratio 83, FEV1 2.64 (74% predicted), DLCO 24.75 (89% predicted)  11/09/2019-echocardiogram-LV ejection fraction 65 to 70%, estimated right ventricular systolic pressure is 19.2  08/30/2019-CT chest high-res-mild tubular bronchiectasis and bronchial wall thickening throughout with very fine predominantly peripheral centrilobular nodularity in the upper lungs, regular interstitial opacity which is  slightly increased, findings are indeterminate for UIP, consider annual CT follow-up to assess for stability of fibrotic findings and pattern of fibrotic interstitial lung disease remains suspected, minimal paraseptal emphysema, multiple small bilateral pulmonary nodules measuring up to 6 mm, CAD  08/05/19- FVC 2.95 (61%), FEV1 2.48 (70%), ratio 84 08/05/19- FENO 30   OV 09/03/2020  Subjective:  Patient ID: [name], male , DOB: February 21, 1951 , age 11 y.o. , MRN: [id number] , ADDRESS: [address] [place] [address] PCP Verena Mems, MD Patient Care Team: Verena Mems, MD as PCP - General (Family Medicine) Nahser, Aleene PARAS, MD as PCP - Cardiology (Cardiology)  This Provider for this visit: Treatment Team:  Attending Provider: Geronimo Amel, MD    09/03/2020 -   Chief Complaint  Patient presents with   Follow-up    Pt is here today to discuss results of recent CT. Pt states he still has SOB with exertion and has an occ cough.     HPI BERGEN MAGNER 73 y.o. -presents for follow-up.  He is feeling somewhat better as evidenced by the symptom score below.  However in January he felt quite ill with significant respiratory complaints.  Significant amount of phone calls.  Visits to the ER.  His Covid test was negative but it was in the middle of omicron pandemic.  He had CT angiogram in the ER and ruled out pulmonary embolism.  He had follow-up high-resolution CT chest in February 2022 and is previous ILD that he is very concerned about even though was early/mild has resolved.  I personally visualized the 2 scans and do agree with the radiologist.  I also showed them  the scans.  There is only slight evidence of emphysema.  However despite all this improvement it seems that his pulmonary function test is worse.  Do not know with the technique issue.  He still does have some residual shortness of breath however.  Talking to him he tells me that back in January 2020 before his  problem started he had viral infection.  He believes it was COVID-19.  Although COVID-19 officially was not in [place]  in January 2020.  He believes he had a positive test but I do not see records of this.  In any event he is better now.  Also there was some mold exposure in his house he is got this cleaned up.  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   CT Chest data 08/15/20 HRCT   TECHNIQUE: Multidetector CT imaging of the chest was performed following the standard protocol without intravenous contrast. High resolution imaging of the lungs, as well as inspiratory and expiratory imaging, was performed.   COMPARISON:  07/18/2020, 08/30/2019   FINDINGS: Cardiovascular: Scattered aortic atherosclerosis. Normal heart size. Three-vessel coronary artery calcifications. No pericardial effusion.   Mediastinum/Nodes: No enlarged mediastinal, hilar, or axillary lymph nodes. Primarily fat containing hiatal hernia. Thyroid  gland, trachea, and esophagus demonstrate no significant findings.   Lungs/Pleura: Scattered tiny centrilobular nodules at the lung apices. Occasional stable, small, definitively benign pulmonary nodules, for example a 6 mm nodule of the left lower lobe (series 9, image 94). Minimal paraseptal emphysema. Unchanged, mild, tubular bronchiectasis. Previously noted irregular peripheral interstitial opacity is not appreciated, possibly resolved partial atelectasis owing to improved lung volumes on current examination. No significant air trapping on expiratory phase imaging. No pleural effusion or pneumothorax.   Upper Abdomen: No acute abnormality.   Musculoskeletal: No chest wall mass or suspicious bone lesions identified.   IMPRESSION: 1. Previously noted irregular peripheral interstitial opacity at the lung bases is not appreciated, possibly resolved partial atelectasis owing to improved lung volumes on  current examination. There is mild tubular bronchiectasis, nonspecific, without evidence of fibrotic interstitial lung disease. 2. Scattered tiny centrilobular nodules at the lung apices, most likely smoking-related respiratory bronchiolitis. 3. Additional stable, small, definitively benign pulmonary nodules for which no further follow-up or characterization is required. 4. Minimal paraseptal emphysema. 5. Coronary artery disease.   Aortic Atherosclerosis (ICD10-I70.0) and Emphysema (ICD10-J43.9).     Electronically Signed   By: Marolyn Jaksch M.D.   On: 08/16/2020 16:19     06/16/2021- INTERIM HX  Seen on 11/30 for COPD exacerbation. He had not been on maintenance inhaler for 6 months at that time. He was advised to resume Spiriva  Respimat. Augmentin  and prednisone  course cleared up cough and chest congestion. He did have some associated diarrhea when taking abx which has resolved. He is left with some lightheadedness, weakness and fatigue. He continues to have some shortness of breath with exertion only. He is taking Spiriva  Respimat as prescribed, he was given sample at last visit. He is not sure if he can afford but prescription has been sent to pharmacy.  He ambulates with cane. Denies falls.    OV 07/31/2021  Subjective:  Patient ID: [name], male , DOB: [birth year] , age 64 y.o. , MRN: [id number] , ADDRESS: [address] [place] [address] PCP Verena Mems, MD Patient Care Team: Verena Mems, MD as PCP - General (Family Medicine) Nahser, Aleene PARAS, MD as PCP - Cardiology (Cardiology)  This Provider for this visit: Treatment Team:  Attending Provider: Geronimo Amel, MD    07/31/2021 -   Chief Complaint  Patient presents with   Follow-up    PFT performed today. Pt states that he has been a little more SOB over the past few months especially when going upstairs.    HPI DIMETRIUS MONTFORT 73 y.o. -returns for follow-up.  I personally saw him in  March 2022 after that he see nurse practitioners.  He tells me that he has had 4 exacerbations of bronchitis in the last 1 year.  Each of this required antibiotic and prednisone  and he will get better.  Review of the medical record shows that sometimes he is not taking Spiriva .  Currently is feeling stable.  He has pulmonary function test today.  It is stable.  Still shows restriction with a normal DLCO suggesting neuromuscular defect.  He has positive rheumatoid factor but he denied any major arthritis today.  His CT scan from a year ago just showed minimal paraseptal emphysema.  We did a COPD CAT symptom score today and it is stable.  Very minimal symptoms only.  He is going to see cardiology for his routine visit today.  We discussed about changing his inhalers to see if this would prevent repeated episodes of bronchitis where he behaves like a classic COPD exacerbation.  He is open to this idea.  We discussed alternative of Daliresp or doing triple inhaler therapy.  We settled for trying a combination of long-acting beta agonist and anticholinergic such as Stiolto.   CAT Score 07/31/2021 06/16/2021  Total CAT Score 10 13      CT Chest data  No results found.     OV 05/24/2024  Subjective:  Patient ID: [name], male , DOB: [birth year] , age 15 y.o. , MRN: [id number] , ADDRESS: [id number] [place] [address] PCP Chet Mad, DO Patient Care Team: Chet Mad, DO as PCP - General (Family Medicine) Nahser, Aleene PARAS, MD (Inactive) as PCP - Cardiology (Cardiology)  This Provider for this visit: Treatment Team:  Attending Provider: Geronimo Amel, MD   Frequent episodes of bronchitis  -  remote smoker heavy Restrictive pulmonary function test with normal DLCO  -No evidence of ILD in February 2022 CT chest Minimal paraseptal emphysema on CT chest February 2022   - On Spiriva  Rheumatoid factor positive  -May 2022   05/24/2024 -   Chief  Complaint  Patient presents with   Acute Visit    Pt stated he was on antibiotics for 10 days, Dry cough came back more harsh very SOB and fatigue pt also stated he is dizzy     HPI [name] 73 y.o. -Josehua Hammar is a 73 year old male with COPD who presents with worsening cough and shortness of breath.  He is experiencing a recent exacerbation of COPD, for which he was prescribed Nodoxi. Initially, his cough improved by day six of treatment but began to worsen again by day eight. The cough is now deeper, more frequent, occasionally productive, and is accompanied by significant shortness of breath. He is currently on a course of prednisone , which he has been taking for eight days, with two days remaining.  He was diagnosed with thrombocytopenia in December, following a period of declining platelets, weight loss, fatigue, and shortness of breath. A bone marrow biopsy performed about a month ago ruled out leukemia. He notes a significant decline in health and fitness  over the past three months.  He mentions a history of elevated BUN levels and frothy urine. He has also experienced tremors and reports a high rheumatoid factor. He has a history of high platelets five to eight years ago, which have since decreased.  He has lost fifteen pounds over the past five to six months but has regained three pounds by increasing his caloric intake and consuming protein drinks. He experiences extreme weakness and shortness of breath with minimal exertion, such as walking to the mailbox.  He has not seen an endocrinologist recently and has not had an echocardiogram in four years, although he had a heart PET scan that showed a strong heart. He is scheduled to start pulmonary rehab on December 2nd to improve his stamina.  No swallowing difficulties.      SYMPTOM SCALE - 10/29/2019  09/03/2020   O2 use ra ra0  Shortness of Breath 0 -> 5 scale with 5 being worst (score 6 If unable to do)   At  rest 1 0  Simple tasks - showers, clothes change, eating, shaving 1 1  Household (dishes, doing bed, laundry) 2 1  Shopping 1.5 Does not shop  Walking level at own pace 1 1  Walking up Stairs 2 2  Total (30-36) Dyspnea Score 8.5 (7 without shopping) 5  How bad is your cough? 0 0  How bad is your fatigue 1 00  How bad is nausea 0 0  How bad is vomiting?  0 0  How bad is diarrhea? 0 0  How bad is anxiety? 0 0  How bad is depression 0 0     Simple office walk 185 feet x  3 laps goal with forehead probe 10/29/2019  09/03/2020   O2 used ra ra  Number laps completed 3  order  Comments about pace moderate avg  Resting Pulse Ox/HR 99% and 80/min 99% and 71/min  Final Pulse Ox/HR 99% and 93/min 97% and 98  Desaturated </= 88% no no  Desaturated <= 3% points no no  Got Tachycardic >/= 90/min yes ues  Symptoms at end of test Mild dyspena Very mild dyspneax  Miscellaneous comments x    CT Chest data from date:sept 2025  - personally visualized and independently interpreted : ye - my findings are: same +/- ILA   IMPRESSION: 1. No evidence of lymphadenopathy or metastatic disease in the neck, chest, abdomen, or pelvis. 2. Prostatomegaly. 3. Coronary artery disease. 4. Chronic incidental and postoperative findings as detailed above.   Aortic Atherosclerosis (ICD10-I70.0) and Emphysema (ICD10-J43.9).     Electronically Signed   By: Marolyn JONETTA Jaksch M.D.   On: 04/02/2024 19:09   PFT     Latest Ref Rng & Units 11/18/2023    8:48 AM 07/31/2021   11:52 AM 08/04/2020    8:53 AM 03/11/2020   10:06 AM 09/25/2019    4:01 PM  PFT Results  FVC-Pre L 3.23  2.99  2.95  P 3.17  3.15   FVC-Predicted Pre % 69  63  61  P 65  65   FVC-Post L 3.25  3.10      FVC-Predicted Post % 69  65      Pre FEV1/FVC % % 80  84  84  P 83  83   Post FEV1/FCV % % 86  87      FEV1-Pre L 2.58  2.52  2.48  P 2.64  2.63   FEV1-Predicted Pre % 75  72  70  P 74  73   FEV1-Post L 2.78  2.69      DLCO uncorrected  ml/min/mmHg 21.08  22.06   24.75  21.79   DLCO UNC% % 77  80   89  78   DLCO corrected ml/min/mmHg  22.06   24.75  21.32   DLCO COR %Predicted %  80   89  76   DLVA Predicted % 112  126   121  112   TLC L 5.92  4.84      TLC % Predicted % 79  65      RV % Predicted % 106  68        P Preliminary result       LAB RESULTS last 96 hours No results found.       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   reports that he quit smoking about 38 years ago. His smoking use included cigarettes. He started smoking about 59 years ago. He has a 36 pack-year smoking history. He has never been exposed to tobacco smoke. He has never used smokeless tobacco.  Past Surgical History:  Procedure Laterality Date   APPENDECTOMY  07/05/1982   CATARACT EXTRACTION     L and R eye   CHOLECYSTECTOMY  07/05/2000   HERNIA REPAIR  2017   HYDROCELE EXCISION Right 11/04/2016   Procedure: HYDROCELECTOMY ADULT;  Surgeon: Garnette Shack, MD;  Location: [facility];  Service: Urology;  Laterality: Right;   INGUINAL HERNIA REPAIR Bilateral 11/04/2016   Procedure: HERNIA REPAIR INGUINAL ADULT BILATERAL;  Surgeon: Krystal Russell, MD;  Location: [facility];  Service: General;  Laterality: Bilateral;   INSERTION OF MESH Bilateral 11/04/2016   Procedure: INSERTION OF MESH;  Surgeon: Krystal Russell, MD;  Location: [facility];  Service: General;  Laterality: Bilateral;   JOINT REPLACEMENT     Lt hip   LEFT HEART CATH AND CORONARY ANGIOGRAPHY N/A 10/31/2017   Procedure: LEFT HEART CATH AND CORONARY  ANGIOGRAPHY;  Surgeon: Burnard Debby LABOR, MD;  Location: [facility];  Service: Cardiovascular;  Laterality: N/A;   MASTOIDECTOMY  07/05/1994   NASAL SEPTUM SURGERY     sinus surgery   PARATHYROIDECTOMY     TOTAL HIP REVISION  06/14/2011   Procedure: TOTAL HIP REVISION;  Surgeon: Dempsey JINNY Sensor;  Location: [facility];  Service: Orthopedics;  Laterality: Left;  Left Acetabular  Hip Revision    Allergies  Allergen Reactions   Bupropion  Other (See Comments)    Caused altered mental status Altered mental status Caused altered mental status   Ciprofloxacin -Dexamethasone  Other (See Comments)    Burning in ears when Cipro  ear drops were used unknown Burning in ears when Cipro  ear drops were used   Gabapentin  Other (See Comments)    Caused weakness, dizziness, and forgetfulness  Weakness and dizziness with higher doses. Weakness and dizziness Caused weakness, dizziness, and forgetfulness   Ciprofloxacin  Hcl Other (See Comments)    Burning in ears when drops were used   Clonidine Hcl Other (See Comments)    Immunization History  Administered Date(s) Administered   Fluad Trivalent(High Dose 65+) 03/05/2023   Fluzone  Influenza virus vaccine,trivalent (IIV3), split virus 04/03/2010,  04/02/2011, 04/18/2012, 04/27/2013   H1N1 04/25/2008   INFLUENZA, HIGH DOSE SEASONAL PF 03/30/2016   Influenza Split 04/25/2008, 04/08/2009, 04/05/2015, 03/30/2019   Influenza,inj,Quad PF,6+ Mos 04/02/2016, 03/03/2017, 04/28/2018, 04/04/2019, 03/20/2020   Influenza,inj,quad, With Preservative 04/15/2015   Influenza-Unspecified 03/26/2021   PFIZER(Purple Top)SARS-COV-2 Vaccination 08/12/2019, 09/05/2019, 04/15/2020, 10/13/2020   Pfizer Covid-19 Vaccine Bivalent Booster 75yrs & up 03/17/2021   Pneumococcal Conjugate-13 05/08/2015   Pneumococcal Polysaccharide-23 10/24/2007, 03/03/2017   Respiratory Syncytial Virus Vaccine,Recomb Aduvanted(Arexvy) 03/01/2022   Td 03/26/2004   Tdap 03/13/2013   Zoster, Live  03/13/2013, 05/25/2021    Family History  Problem Relation Age of Onset   Heart Problems Mother    Arthritis Mother    Lung cancer Father 104       smoked   Heart Problems Father    Neuropathy Sister    Cancer Brother        prostate   Lupus Daughter    Healthy Daughter    Multiple sclerosis Son    Arthritis Sister      Current Outpatient Medications:    albuterol  (VENTOLIN  HFA) 108 (90 Base) MCG/ACT inhaler, Inhale 2 puffs into the lungs every 6 (six) hours as needed for wheezing or shortness of breath., Disp: 8 g, Rfl: 3   Artificial Saliva (ACT DRY MOUTH) LOZG, 1 tablet Mouth/Throat 3-4 times day as needed, Disp: , Rfl:    aspirin  EC 81 MG tablet, Take 81 mg by mouth daily. Swallow whole., Disp: , Rfl:    Budeson-Glycopyrrol-Formoterol (BREZTRI  AEROSPHERE) 160-9-4.8 MCG/ACT AERO, Inhale 2 puffs into the lungs in the morning and at bedtime., Disp: 3 each, Rfl: 3   celecoxib (CELEBREX) 200 MG capsule, Take 200 mg by mouth daily., Disp: , Rfl:    cetirizine (ZYRTEC) 10 MG chewable tablet, Chew by mouth., Disp: , Rfl:    doxycycline  (VIBRA -TABS) 100 MG tablet, Take 1 tablet (100 mg total) by mouth 2 (two) times daily., Disp: 20 tablet, Rfl: 0   eszopiclone (LUNESTA) 2 MG TABS tablet, Take 2 mg by mouth at bedtime as needed for sleep. Take immediately before bedtime, Disp: , Rfl:    famotidine  (PEPCID ) 20 MG tablet, Take 1 tablet (20 mg total) by mouth at bedtime., Disp: 30 tablet, Rfl: 3   levothyroxine (SYNTHROID) 50 MCG tablet, 2 tablets Orally Once a day; Duration: 90 days, Disp: , Rfl:    Magnesium  200 MG TABS, Magnesium  gluconate (Patient taking differently: Take 500 mg by mouth 1 day or 1 dose. Magnesium  gluconate), Disp: , Rfl:    methylphenidate  (RITALIN ) 10 MG tablet, 1 tablet on empty stomach (Patient taking differently: Take 10 mg by mouth 3 (three) times daily with meals.), Disp: , Rfl:    metoprolol  succinate (TOPROL -XL) 25 MG 24 hr tablet, Take 1 tablet (25 mg total) by  mouth daily., Disp: 90 tablet, Rfl: 3   mirtazapine (REMERON) 30 MG tablet, Take 30 mg by mouth at bedtime., Disp: , Rfl:    Multiple Vitamin (MULTIVITAMIN) tablet, Take 1 tablet by mouth daily., Disp: , Rfl:    pantoprazole  (PROTONIX ) 40 MG tablet, Take 40 mg by mouth 2 (two) times daily., Disp: , Rfl:    polyethylene glycol (MIRALAX  / GLYCOLAX ) packet, Take 17 g by mouth daily as needed (for constipation.). , Disp: , Rfl:    predniSONE  (DELTASONE ) 10 MG tablet, Please take Take prednisone  40mg  once daily x 3 days, then 30mg  once daily x 3 days, then 20mg  once daily x 3 days, then prednisone  10mg  once  daily  x 3 days and then pred 15mg  dialy x 3 days and  stop, Disp: 31 tablet, Rfl: 0   Probiotic Product (PROBIOTIC DAILY) CAPS, Pre/Probiotic Orally once a day, Disp: , Rfl:    rosuvastatin  (CRESTOR ) 10 MG tablet, TAKE 1 TABLET(10 MG) BY MOUTH DAILY, Disp: 90 tablet, Rfl: 3   sertraline (ZOLOFT) 100 MG tablet, Take by mouth., Disp: , Rfl:    tamsulosin  (FLOMAX ) 0.4 MG CAPS capsule, Take 0.4 mg by mouth daily after breakfast. , Disp: , Rfl:    Docusate Sodium  (DSS) 100 MG CAPS, Take by mouth., Disp: , Rfl:    donepezil  (ARICEPT ) 10 MG tablet, Take half tablet (5 mg) daily for 2 weeks, then increase to the full tablet at 10 mg daily (Patient not taking: Reported on 05/24/2024), Disp: 90 tablet, Rfl: 3      Objective:   Vitals:   05/24/24 1608  BP: 116/70  Pulse: 62  Temp: 98.5 F (36.9 C)  TempSrc: Oral  SpO2: 98%  Weight: 170 lb 6.4 oz (77.3 kg)  Height: 5' 11.5 (1.816 m)    Estimated body mass index is 23.43 kg/m as calculated from the following:   Height as of this encounter: 5' 11.5 (1.816 m).   Weight as of this encounter: 170 lb 6.4 oz (77.3 kg).  @WEIGHTCHANGE @  Filed Weights   05/24/24 1608  Weight: 170 lb 6.4 oz (77.3 kg)     Physical Exam   General: No distress. Looks chronic unwell O2 at rest: no Cane present: no Sitting in wheel chair: no Frail: no Obese:  no Neuro: Alert and Oriented x 3. GCS 15. Speech normal Psych: Pleasant Resp:  Barrel Chest - no.  Wheeze - no, Crackles - no, No overt respiratory distress CVS: Normal heart sounds. Murmurs - no Ext: Stigmata of Connective Tissue Disease - no HEENT: Normal upper airway. PEERL +. No post nasal drip        Assessment/     Assessment & Plan Post-viral cough syndrome  Elevated rheumatoid factor  Chronic fatigue  Unintentional weight loss    PLAN Patient Instructions     ICD-10-CM   1. Post-viral cough syndrome  R05.8     2. Elevated rheumatoid factor  R76.89     3. Chronic fatigue  R53.82     4. Unintentional weight loss  R63.4       REdo prednisone  as follows  - Please take Take prednisone  40mg  once daily x 3 days, then 30mg  once daily x 3 days, then 20mg  once daily x 3 days, then prednisone  10mg  once daily  x 3 days and then prednisone  5mg  daily x 3 days and  stop   Some 2-4  weeks after completing prednisone  - 4 weeks from 05/24/2024  - do ANA, RF, CCP, Myositis panel, CK, ALdolase, ESR, Quantiferon Gold   - do RAST allergy pan   Followup  - late Jan 2026 / early FEb 2026 with APP or DR Geronimo BOSCH    Return for  - late Jan 2026 / early FEb 2026 with APP or DR Geronimo.    SIGNATURE    Dr. Dorethia Geronimo, M.D., F.C.C.P,  Pulmonary and Critical Care Medicine Staff Physician, [facility] Center Director - Interstitial Lung Disease  Program  Pulmonary Fibrosis [facility] at [facility] [place], [place], [address]  Pager: [phone], If no answer or between  15:00h - 7:00h: call [phone]  [phone] Telephone: [phone]  5:19 PM 05/24/2024

## 2024-05-24 NOTE — Telephone Encounter (Signed)
 Left detailed VM with NP's rec's also send [facility] message. 05/24/2024 at 6:05 pm

## 2024-05-24 NOTE — Telephone Encounter (Signed)
 FYI Only or Action Required?: FYI only for provider: appointment scheduled on 11/20.  Patient is followed in Pulmonology for COPD, last seen on 05/15/2024 by Neda Jennet LABOR, MD.  Called Nurse Triage reporting Shortness of Breath.  Symptoms began yesterday.  Interventions attempted: OTC medications: Delsym , Prescription medications: Prednisone , Doxycycline , and Maintenance inhaler.  Symptoms are: gradually worsening.  Triage Disposition: See HCP Within 4 Hours (Or PCP Triage)  Patient/caregiver understands and will follow disposition?: yes  E2C2 Pulmonary Triage - Initial Assessment Questions "Chief Complaint (e.g., cough, sob, wheezing, fever, chills, sweat or additional symptoms) *Go to specific symptom protocol after initial questions. SOB, decreased pulse, cough  "How long have symptoms been present?" Chronic- under treatment for COPD exacerbation presently. Patient reports he was feeling better and cough went away- but came back last night.  Have you tested for COVID or Flu? Note: If not, ask patient if a home test can be taken. If so, instruct patient to call back for positive results. Yes  MEDICINES:   "Have you used any OTC meds to help with symptoms?" Yes If yes, ask "What medications?" Delsym    "Have you used your inhalers/maintenance medication?" Yes If yes, "What medications?" Breztri  Albuterol   If inhaler, ask "How many puffs and how often?" Note: Review instructions on medication in the chart. Patient forgets to use it  OXYGEN : "Do you wear supplemental oxygen ?" No If yes, "How many liters are you supposed to use?" na  "Do you monitor your oxygen  levels?" Yes If yes, What is your reading (oxygen  level) today? Has not checked today- not at home  What is your usual oxygen  saturation reading?  (Note: Pulmonary O2 sats should be 90% or greater) Normal range 98%   Copied from CRM #[id number]. Topic: Clinical - Red Word Triage >> May 24, 2024  9:49 AM  Rozanna MATSU wrote: Red Word that prompted transfer to Nurse Triage: weak and short of breath, pulse rate 45 (this was today). Reason for Disposition  [1] Longstanding difficulty breathing AND [2] not responding to usual therapy  Answer Assessment - Initial Assessment Questions 1. RESPIRATORY STATUS: Describe your breathing? (e.g., wheezing, shortness of breath, unable to speak, severe coughing)      SOB, cough 2. ONSET: When did this breathing problem begin?      Ongoing for last 6 months- has been getting worse- COPD progression 3. PATTERN Does the difficult breathing come and go, or has it been constant since it started?      Comes and goes- with activity 4. SEVERITY: How bad is your breathing? (e.g., mild, moderate, severe)      Gets winded easily 5. RECURRENT SYMPTOM: Have you had difficulty breathing before? If Yes, ask: When was the last time? and What happened that time?      Patient has progressively getting worse- last 2 months 6. CARDIAC HISTORY: Do you have any history of heart disease? (e.g., heart attack, angina, bypass surgery, angioplasty)      Low BP and pulse- during the last year- is having monitored, P 45, 110/78 7. LUNG HISTORY: Do you have any history of lung disease?  (e.g., pulmonary embolus, asthma, emphysema)     COPD 8. CAUSE: What do you think is causing the breathing problem?      Worsening COPD 9. OTHER SYMPTOMS: Do you have any other symptoms? (e.g., chest pain, cough, dizziness, fever, runny nose)     Cough-mild 10. O2 SATURATION MONITOR:  Do you use an oxygen  saturation monitor (pulse oximeter) at  home? If Yes, ask: What is your reading (oxygen  level) today? What is your usual oxygen  saturation reading? (e.g., 95%)       98-99%, has not checked today  12. TRAVEL: Have you traveled out of the country in the last month? (e.g., travel history, exposures)       Currently being treated- prednisone  and antibiotic- 2 days of therapy  left  Protocols used: Breathing Difficulty-A-AH

## 2024-05-24 NOTE — Telephone Encounter (Signed)
 Noted.  Noting further needed.

## 2024-05-25 ENCOUNTER — Telehealth (HOSPITAL_COMMUNITY): Payer: Self-pay

## 2024-05-25 NOTE — Telephone Encounter (Signed)
 Called to confirm appt. Pt confirmed appt. Instructed pt on proper footwear. Gave directions along with department number.

## 2024-05-28 ENCOUNTER — Telehealth (HOSPITAL_COMMUNITY): Payer: Self-pay

## 2024-05-28 ENCOUNTER — Ambulatory Visit: Payer: PRIVATE HEALTH INSURANCE | Admitting: Physician Assistant

## 2024-05-28 ENCOUNTER — Encounter (HOSPITAL_COMMUNITY): Admission: RE | Admit: 2024-05-28

## 2024-05-28 NOTE — Telephone Encounter (Signed)
 Patient's wife called to cancel patient's orientation for 9:00 this morning, no reason given. They would like to reschedule.

## 2024-05-28 NOTE — Telephone Encounter (Signed)
 [name]

## 2024-05-30 ENCOUNTER — Encounter (HOSPITAL_COMMUNITY)
Admission: RE | Admit: 2024-05-30 | Discharge: 2024-05-30 | Disposition: A | Discharge status: Home / Self Care | Source: Ambulatory Visit | Attending: Pulmonary Disease | Admitting: Pulmonary Disease

## 2024-05-30 ENCOUNTER — Encounter (HOSPITAL_COMMUNITY): Payer: Self-pay

## 2024-05-30 VITALS — BP 92/58 | HR 69 | Wt 169.8 lb

## 2024-05-30 DIAGNOSIS — J984 Other disorders of lung: Secondary | ICD-10-CM | POA: Diagnosis not present

## 2024-05-30 NOTE — Progress Notes (Signed)
 Pulmonary Individual Treatment Plan  Patient Details  Name: [name] MRN: [id number] Date of Birth: June 18, 1951 Referring Provider:   Conrad Ports Pulmonary Rehab Walk Test from 05/30/2024 in [facility]  Referring Provider Olalere    Initial Encounter Date:  Flowsheet Row Pulmonary Rehab Walk Test from 05/30/2024 in [facility]  Date 05/30/24    Visit Diagnosis: Restrictive lung disease  Patient's Home Medications on Admission:   Current Outpatient Medications:    albuterol  (VENTOLIN  HFA) 108 (90 Base) MCG/ACT inhaler, Inhale 2 puffs into the lungs every 6 (six) hours as needed for wheezing or shortness of breath., Disp: 8 g, Rfl: 3   Artificial Saliva (ACT DRY MOUTH) LOZG, 1 tablet Mouth/Throat 3-4 times day as needed, Disp: , Rfl:    aspirin  EC 81 MG tablet, Take 81 mg by mouth daily. Swallow whole., Disp: , Rfl:    Budeson-Glycopyrrol-Formoterol (BREZTRI  AEROSPHERE) 160-9-4.8 MCG/ACT AERO, Inhale 2 puffs into the lungs in the morning and at bedtime., Disp: 3 each, Rfl: 3   celecoxib (CELEBREX) 200 MG capsule, Take 200 mg by mouth daily., Disp: , Rfl:    cetirizine (ZYRTEC) 10 MG chewable tablet, Chew by mouth., Disp: , Rfl:    Docusate Sodium  (DSS) 100 MG CAPS, Take by mouth., Disp: , Rfl:    eszopiclone (LUNESTA) 2 MG TABS tablet, Take 2 mg by mouth at bedtime as needed for sleep. Take immediately before bedtime, Disp: , Rfl:    famotidine  (PEPCID ) 20 MG tablet, Take 1 tablet (20 mg total) by mouth at bedtime., Disp: 30 tablet, Rfl: 3   levothyroxine (SYNTHROID) 50 MCG tablet, 2 tablets Orally Once a day; Duration: 90 days, Disp: , Rfl:    Magnesium  200 MG TABS, Magnesium  gluconate (Patient taking differently: Take 500 mg by mouth 1 day or 1 dose. Magnesium  gluconate), Disp: , Rfl:    methylphenidate  (RITALIN ) 10 MG tablet, 1 tablet on empty stomach (Patient  taking differently: Take 10 mg by mouth 3 (three) times daily with meals.), Disp: , Rfl:    metoprolol  succinate (TOPROL -XL) 25 MG 24 hr tablet, Take 1 tablet (25 mg total) by mouth daily. (Patient taking differently: Take 25 mg by mouth daily. 12.5mg  daily), Disp: 90 tablet, Rfl: 3   mirtazapine (REMERON) 30 MG tablet, Take 30 mg by mouth at bedtime., Disp: , Rfl:    Multiple Vitamin (MULTIVITAMIN) tablet, Take 1 tablet by mouth daily., Disp: , Rfl:    pantoprazole  (PROTONIX ) 40 MG tablet, Take 40 mg by mouth 2 (two) times daily., Disp: , Rfl:    polyethylene glycol (MIRALAX  / GLYCOLAX ) packet, Take 17 g by mouth daily as needed (for constipation.). , Disp: , Rfl:    predniSONE  (DELTASONE ) 10 MG tablet, Please take Take prednisone  40mg  once daily x 3 days, then 30mg  once daily x 3 days, then 20mg  once daily x 3 days, then prednisone  10mg  once daily  x 3 days and then pred 15mg  dialy x 3 days and  stop, Disp: 31 tablet, Rfl: 0   Probiotic Product (PROBIOTIC DAILY) CAPS, Pre/Probiotic Orally once a day, Disp: , Rfl:    rosuvastatin  (CRESTOR ) 10 MG tablet, TAKE 1 TABLET(10 MG) BY MOUTH DAILY, Disp: 90 tablet, Rfl: 3   sertraline (ZOLOFT) 100 MG tablet, Take by mouth., Disp: , Rfl:    tamsulosin  (FLOMAX ) 0.4 MG CAPS capsule, Take 0.4 mg by mouth daily after breakfast. ,  Disp: , Rfl:    donepezil  (ARICEPT ) 10 MG tablet, Take half tablet (5 mg) daily for 2 weeks, then increase to the full tablet at 10 mg daily (Patient not taking: Reported on 05/30/2024), Disp: 90 tablet, Rfl: 3   doxycycline  (VIBRA -TABS) 100 MG tablet, Take 1 tablet (100 mg total) by mouth 2 (two) times daily. 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 last stress test 2003   Pneumonia 2009   Primary immune deficiency disorder    Tumor cells, benign    lung, left side   Unstable angina (HCC) 10/28/2017    Tobacco Use: Social History   Tobacco Use  Smoking Status Former   Current packs/day: 0.00   Average packs/day: 1.5 packs/day for 24.0 years (36.0 ttl pk-yrs)   Types: Cigarettes   Start date: 33   Quit date: 07/05/1985   Years since quitting: 38.9   Passive exposure: Never  Smokeless Tobacco Never    Labs: Review Flowsheet  More data exists      Latest Ref Rng & Units 10/21/2015 10/28/2017 10/29/2017 02/13/2018 12/29/2018  Labs for ITP Cardiac and Pulmonary Rehab  Cholestrol 100 - 199 mg/dL 810  - 871  865  -  LDL (calc) 0 - 99 mg/dL 875  - 76  66  -  HDL-C >39 mg/dL 43  - 38  41  -  Trlycerides 0 - 149 mg/dL 889  - 69  862  -  Hemoglobin A1c 4.8 - 5.6 % 5.9  5.4  - - -  TCO2 22 - 32 mmol/L - - - - 30     Capillary Blood Glucose: Lab Results  Component Value Date   GLUCAP 152 (H) 12/29/2018   GLUCAP 136 (H) 03/15/2016   GLUCAP 102 (H) 09/05/2015   GLUCAP 84 09/05/2015   GLUCAP 121 (H) 09/04/2015     Pulmonary Assessment Scores:  Pulmonary Assessment Scores     Row Name 05/30/24 1056         ADL UCSD   ADL Phase Entry     SOB Score total 31       CAT Score   CAT Score 16       mMRC Score   mMRC Score 4       UCSD: Self-administered rating of dyspnea associated with activities of daily living (ADLs) 6-point scale (0 = not at all to 5 = maximal or unable to do because of breathlessness)  Scoring Scores range from 0 to 120.  Minimally important difference  is 5 units  CAT: CAT can identify the health impairment of COPD patients and is better correlated with disease progression.  CAT has a scoring range of zero to 40. The CAT score  is classified into four groups of low (less than 10), medium (10 - 20), high (21-30) and very high (31-40) based on the impact level of disease on health status. A CAT score over 10 suggests significant symptoms.  A worsening CAT score could be explained by an exacerbation, poor medication adherence, poor inhaler technique, or progression of COPD or comorbid conditions.  CAT MCID is 2 points  mMRC: mMRC (Modified Medical Research Council) Dyspnea Scale is used to assess the degree of baseline functional disability in patients of respiratory disease due to dyspnea. No minimal important difference is established. A decrease in score of 1 point or greater is considered a positive change.   Pulmonary Function Assessment:  Pulmonary Function Assessment - 05/30/24 1056       Breath   Bilateral Breath Sounds Clear    Shortness of Breath Yes;Limiting activity          Exercise Target Goals: Exercise Program Goal: Individual exercise prescription set using results from initial 6 min walk test and THRR while considering  patient's activity barriers and safety.   Exercise Prescription Goal: Initial exercise prescription builds to 30-45 minutes a day of aerobic activity, 2-3 days per week.  Home exercise guidelines will be given to patient during program as part of exercise prescription that the participant will acknowledge.  Activity Barriers & Risk Stratification:  Activity Barriers & Cardiac Risk Stratification - 05/30/24 1055       Activity Barriers & Cardiac Risk Stratification   Activity Barriers Arthritis;Deconditioning;Left Hip Replacement;Shortness of Breath;Muscular Weakness;Balance Concerns          6 Minute Walk:  6 Minute Walk     Row Name 05/30/24 1145         6 Minute Walk   Phase Initial      Distance 1137 feet     Walk Time 6 minutes     # of Rest Breaks 0     MPH 2.15     METS 1.6     RPE 13     Perceived Dyspnea  2     VO2 Peak 5.62     Symptoms No     Resting HR 69 bpm     Resting BP 92/58     Resting Oxygen  Saturation  100 %     Exercise Oxygen  Saturation  during 6 min walk 96 %     Max Ex. HR 77 bpm     Max Ex. BP 102/58     2 Minute Post BP 98/60       Interval HR   1 Minute HR 74     2 Minute HR 77     3 Minute HR 73     4 Minute HR 71     5 Minute HR 72     6 Minute HR 73     2 Minute Post HR 57     Interval Heart Rate? Yes       Interval Oxygen    Interval Oxygen ? Yes     Baseline Oxygen  Saturation % 100 %     1 Minute Oxygen  Saturation % 98 %     1 Minute Liters of Oxygen  0 L     2 Minute Oxygen  Saturation % 98 %     2 Minute Liters of Oxygen  0 L     3 Minute Oxygen  Saturation % 98 %     3 Minute Liters of Oxygen  0 L     4 Minute  Oxygen  Saturation % 96 %     4 Minute Liters of Oxygen  0 L     5 Minute Oxygen  Saturation % 96 %     5 Minute Liters of Oxygen  0 L     6 Minute Oxygen  Saturation % 97 %     6 Minute Liters of Oxygen  0 L     2 Minute Post Oxygen  Saturation % 99 %     2 Minute Post Liters of Oxygen  0 L        Oxygen  Initial Assessment:  Oxygen  Initial Assessment - 05/30/24 1056       Home Oxygen    Home Oxygen  Device None    Sleep Oxygen  Prescription None    Home Exercise Oxygen  Prescription None    Home Resting Oxygen  Prescription None      Initial 6 min Walk   Oxygen  Used None      Program Oxygen  Prescription   Program Oxygen  Prescription None      Intervention   Short Term Goals To learn and understand importance of maintaining oxygen  saturations>88%;To learn and understand importance of monitoring SPO2 with pulse oximeter and demonstrate accurate use of the pulse oximeter.;To learn and demonstrate proper pursed lip breathing techniques or other breathing techniques.     Long  Term Goals Maintenance of O2  saturations>88%;Verbalizes importance of monitoring SPO2 with pulse oximeter and return demonstration;Exhibits proper breathing techniques, such as pursed lip breathing or other method taught during program session          Oxygen  Re-Evaluation:   Oxygen  Discharge (Final Oxygen  Re-Evaluation):   Initial Exercise Prescription:  Initial Exercise Prescription - 05/30/24 1100       Date of Initial Exercise RX and Referring Provider   Date 05/30/24    Referring Provider Olalere    Expected Discharge Date 09/04/24      Recumbant Bike   Level 1    RPM 80    Watts 90    Minutes 15    METs 2.5      Recumbant Elliptical   Level 1    RPM 70    Watts 80    Minutes 15    METs 3      Prescription Details   Frequency (times per week) 2    Duration Progress to 30 minutes of continuous aerobic without signs/symptoms of physical distress      Intensity   THRR 40-80% of Max Heartrate 58-117    Ratings of Perceived Exertion 11-13    Perceived Dyspnea 0-4      Progression   Progression Continue to progress workloads to maintain intensity without signs/symptoms of physical distress.      Resistance Training   Training Prescription Yes    Weight Blue bands    Reps 10-15          Perform Capillary Blood Glucose checks as needed.  Exercise Prescription Changes:   Exercise Comments:   Exercise Goals and Review:   Exercise Goals     Row Name 05/30/24 1055             Exercise Goals   Increase Physical Activity Yes       Intervention Provide advice, education, support and counseling about physical activity/exercise needs.;Develop an individualized exercise prescription for aerobic and resistive training based on initial evaluation findings, risk stratification, comorbidities and participant's personal goals.       Expected Outcomes Short Term: Attend rehab on a regular basis to increase amount of physical  activity.;Long Term: Exercising regularly at least 3-5 days a  week.;Long Term: Add in home exercise to make exercise part of routine and to increase amount of physical activity.       Increase Strength and Stamina Yes       Intervention Provide advice, education, support and counseling about physical activity/exercise needs.;Develop an individualized exercise prescription for aerobic and resistive training based on initial evaluation findings, risk stratification, comorbidities and participant's personal goals.       Expected Outcomes Short Term: Increase workloads from initial exercise prescription for resistance, speed, and METs.;Short Term: Perform resistance training exercises routinely during rehab and add in resistance training at home;Long Term: Improve cardiorespiratory fitness, muscular endurance and strength as measured by increased METs and functional capacity ( )       Able to understand and use rate of perceived exertion (RPE) scale Yes       Intervention Provide education and explanation on how to use RPE scale       Expected Outcomes Short Term: Able to use RPE daily in rehab to express subjective intensity level;Long Term:  Able to use RPE to guide intensity level when exercising independently       Able to understand and use Dyspnea scale Yes       Intervention Provide education and explanation on how to use Dyspnea scale       Expected Outcomes Short Term: Able to use Dyspnea scale daily in rehab to express subjective sense of shortness of breath during exertion;Long Term: Able to use Dyspnea scale to guide intensity level when exercising independently       Knowledge and understanding of Target Heart Rate Range (THRR) Yes       Intervention Provide education and explanation of THRR including how the numbers were predicted and where they are located for reference       Expected Outcomes Short Term: Able to state/look up THRR;Long Term: Able to use THRR to govern intensity when exercising independently;Short Term: Able to use daily as guideline  for intensity in rehab       Understanding of Exercise Prescription Yes       Intervention Provide education, explanation, and written materials on patient's individual exercise prescription       Expected Outcomes Short Term: Able to explain program exercise prescription;Long Term: Able to explain home exercise prescription to exercise independently          Exercise Goals Re-Evaluation :   Discharge Exercise Prescription (Final Exercise Prescription Changes):   Nutrition:  Target Goals: Understanding of nutrition guidelines, daily intake of sodium 1500mg , cholesterol 200mg , calories 30% from fat and 7% or less from saturated fats, daily to have 5 or more servings of fruits and vegetables.  Biometrics:    Nutrition Therapy Plan and Nutrition Goals:   Nutrition Assessments:  MEDIFICTS Score Key: >=70 Need to make dietary changes  40-70 Heart Healthy Diet <= 40 Therapeutic Level Cholesterol Diet   Picture Your Plate Scores: <59 Unhealthy dietary pattern with much room for improvement. 41-50 Dietary pattern unlikely to meet recommendations for good health and room for improvement. 51-60 More healthful dietary pattern, with some room for improvement.  >60 Healthy dietary pattern, although there may be some specific behaviors that could be improved.    Nutrition Goals Re-Evaluation:   Nutrition Goals Discharge (Final Nutrition Goals Re-Evaluation):   Psychosocial: Target Goals: Acknowledge presence or absence of significant depression and/or stress, maximize coping skills, provide positive support system. Participant is able to  verbalize types and ability to use techniques and skills needed for reducing stress and depression.  Initial Review & Psychosocial Screening:  Initial Psych Review & Screening - 05/30/24 1101       Initial Review   Current issues with History of Depression;Current Psychotropic Meds      Family Dynamics   Good Support System? Yes     Comments Support from wife and children/family, denies any additional education, resources or referrals at this time      Barriers   Psychosocial barriers to participate in program There are no identifiable barriers or psychosocial needs.      Screening Interventions   Interventions Encouraged to exercise;Provide feedback about the scores to participant    Expected Outcomes Short Term goal: Utilizing psychosocial counselor, staff and physician to assist with identification of specific Stressors or current issues interfering with healing process. Setting desired goal for each stressor or current issue identified.;Long Term Goal: Stressors or current issues are controlled or eliminated.;Short Term goal: Identification and review with participant of any Quality of Life or Depression concerns found by scoring the questionnaire.;Long Term goal: The participant improves quality of Life and PHQ9 Scores as seen by post scores and/or verbalization of changes          Quality of Life Scores:  Scores of 19 and below usually indicate a poorer quality of life in these areas.  A difference of  2-3 points is a clinically meaningful difference.  A difference of 2-3 points in the total score of the Quality of Life Index has been associated with significant improvement in overall quality of life, self-image, physical symptoms, and general health in studies assessing change in quality of life.  PHQ-9: Review Flowsheet  More data may exist      05/30/2024 03/26/2024 12/29/2021 10/05/2021 12/12/2019  Depression screen PHQ 2/9  Decreased Interest 0 0 0 0 0  Down, Depressed, Hopeless 0 0 0 0 0  PHQ - 2 Score 0 0 0 0 0  Altered sleeping 2 - 2 0 3  Tired, decreased energy 3 - 0 1 3  Change in appetite 2 - 1 0 1  Feeling bad or failure about yourself  0 - 0 0 0  Trouble concentrating 0 - 0 0 0  Moving slowly or fidgety/restless 0 - 0 0 0  Suicidal thoughts 0 - 0 0 0  PHQ-9 Score 7 - 3  1  -  Difficult doing  work/chores Not difficult at all - Not difficult at all Not difficult at all Not difficult at all    Details       Data saved with a previous flowsheet row definition        Interpretation of Total Score  Total Score Depression Severity:  1-4 = Minimal depression, 5-9 = Mild depression, 10-14 = Moderate depression, 15-19 = Moderately severe depression, 20-27 = Severe depression   Psychosocial Evaluation and Intervention:  Psychosocial Evaluation - 05/30/24 1102       Psychosocial Evaluation & Interventions   Interventions Encouraged to exercise with the program and follow exercise prescription    Comments Pt scored high on PHQ2/9, scores 0/7. Pt states he has a history of ADHD and depression but states it is and has been well managed for many years. Pt is on medication and sees his therapist regularly. He states his wife and he having been watching their 7 grandchildren for the last 18 years. He stated the youngest one is 74 yrs old and  is looking forward for him to attend school so he will get a break from babysitting. He also stated his wife is in a wheelchair and he is the primary caregiver for her. He denies caregiver fatigue. We will continue to assess and monitor his needs throughout the program.    Expected Outcomes For Octaviano to attend Pulm Rehab without any psy/soc barriers or concerns. For his history of depression to stay well managed.    Continue Psychosocial Services  Follow up required by staff          Psychosocial Re-Evaluation:   Psychosocial Discharge (Final Psychosocial Re-Evaluation):   Education: Education Goals: Education classes will be provided on a weekly basis, covering required topics. Participant will state understanding/return demonstration of topics presented.  Learning Barriers/Preferences:   Education Topics: Know Your Numbers Group instruction that is supported by a PowerPoint presentation. Instructor discusses importance of knowing and  understanding resting, exercise, and post-exercise oxygen  saturation, heart rate, and blood pressure. Oxygen  saturation, heart rate, blood pressure, rating of perceived exertion, and dyspnea are reviewed along with a normal range for these values.    Exercise for the Pulmonary Patient Group instruction that is supported by a PowerPoint presentation. Instructor discusses benefits of exercise, core components of exercise, frequency, duration, and intensity of an exercise routine, importance of utilizing pulse oximetry during exercise, safety while exercising, and options of places to exercise outside of rehab.    MET Level  Group instruction provided by PowerPoint, verbal discussion, and written material to support subject matter. Instructor reviews what METs are and how to increase METs.    Pulmonary Medications Verbally interactive group education provided by instructor with focus on inhaled medications and proper administration. Flowsheet Row PULMONARY REHAB OTHER RESPIRATORY from 12/24/2021 in [facility]  Date 12/24/21  Educator Johnnie  [Handout]  Instruction Review Code 1- Verbalizes Understanding    Anatomy and Physiology of the Respiratory System Group instruction provided by PowerPoint, verbal discussion, and written material to support subject matter. Instructor reviews respiratory cycle and anatomical components of the respiratory system and their functions. Instructor also reviews differences in obstructive and restrictive respiratory diseases with examples of each.    Oxygen  Safety Group instruction provided by PowerPoint, verbal discussion, and written material to support subject matter. There is an overview of "What is Oxygen " and "Why do we need it".  Instructor also reviews how to create a safe environment for oxygen  use, the importance of using oxygen  as prescribed, and the risks of noncompliance. There is a brief  discussion on traveling with oxygen  and resources the patient may utilize. Flowsheet Row PULMONARY REHAB OTHER RESPIRATORY from 12/24/2021 in [facility], [facility] Lung [facility]  Date 12/10/21  Educator Johnnie  [Handout]  Instruction Review Code 1- Verbalizes Understanding    Oxygen  Use Group instruction provided by PowerPoint, verbal discussion, and written material to discuss how supplemental oxygen  is prescribed and different types of oxygen  supply systems. Resources for more information are provided.    Breathing Techniques Group instruction that is supported by demonstration and informational handouts. Instructor discusses the benefits of pursed lip and diaphragmatic breathing and detailed demonstration on how to perform both.     Risk Factor Reduction Group instruction that is supported by a PowerPoint presentation. Instructor discusses the definition of a risk factor, different risk factors for pulmonary disease, and how the heart and lungs work together.   Pulmonary Diseases Group  instruction provided by PowerPoint, verbal discussion, and written material to support subject matter. Instructor gives an overview of the different type of pulmonary diseases. There is also a discussion on risk factors and symptoms as well as ways to manage the diseases.   Stress and Energy Conservation Group instruction provided by PowerPoint, verbal discussion, and written material to support subject matter. Instructor gives an overview of stress and the impact it can have on the body. Instructor also reviews ways to reduce stress. There is also a discussion on energy conservation and ways to conserve energy throughout the day.   Warning Signs and Symptoms Group instruction provided by PowerPoint, verbal discussion, and written material to support subject matter. Instructor reviews warning signs and symptoms of stroke, heart attack, cold and flu. Instructor also  reviews ways to prevent the spread of infection.   Other Education Group or individual verbal, written, or video instructions that support the educational goals of the pulmonary rehab program. Flowsheet Row PULMONARY REHAB OTHER RESPIRATORY from 12/24/2021 in [facility], Vascular, & Lung [facility]  Date 12/03/21  [facility] Levels]  Educator Johnnie  [Handout oxygen  use]  Instruction Review Code 1- Verbalizes Understanding     Knowledge Questionnaire Score:  Knowledge Questionnaire Score - 05/30/24 1059       Knowledge Questionnaire Score   Pre Score 17/18          Core Components/Risk Factors/Patient Goals at Admission:  Personal Goals and Risk Factors at Admission - 05/30/24 1108       Core Components/Risk Factors/Patient Goals on Admission    Weight Management Weight Maintenance;Yes (P)     Intervention Weight Management: Develop a combined nutrition and exercise program designed to reach desired caloric intake, while maintaining appropriate intake of nutrient and fiber, sodium and fats, and appropriate energy expenditure required for the weight goal.;Weight Management: Provide education and appropriate resources to help participant work on and attain dietary goals.;Weight Management/Obesity: Establish reasonable short term and long term weight goals. (P)     Admit Weight 169 lb 12.1 oz (77 kg) (P)           Core Components/Risk Factors/Patient Goals Review:    Core Components/Risk Factors/Patient Goals at Discharge (Final Review):    ITP Comments:   Comments: Dr. Slater Staff is Medical Director for Pulmonary Rehab at [facility].

## 2024-05-30 NOTE — Progress Notes (Signed)
 [name] 73 y.o. male Pulmonary Rehab Orientation Note This patient who was referred to Pulmonary Rehab by Dr. Neda with the diagnosis of restrictive lung disease arrived today in Cardiac and Pulmonary Rehab. He arrived ambulatory with normal gait. He does not carry portable oxygen . Per patient, Tyus uses oxygen  never. Color good, skin warm and dry. Patient is oriented to time and place. Patient's medical history, psychosocial health, and medications reviewed. Psychosocial assessment reveals patient lives with spouse. Davidson is currently retired. Patient hobbies include spending time with others and babysitting his grandchildren. Patient reports his stress level is low. Areas of stress/anxiety include health and family . Patient does not exhibit signs of possible depression. Signs of depression include hopelessness and fatigue. PHQ2/9 score 0/7. Ronnel shows good  coping skills with positive outlook on life. Offered emotional support and reassurance. Will continue to monitor and evaluate progress toward psychosocial goal(s) of decreased health and family stress. Physical assessment reveals heart rate is normal, breath sounds clear to auscultation, no wheezes, rales, or rhonchi. Grip strength equal, strong. Distal pulses present. [name] reports he  does take medications as prescribed. Patient states he  follows a regular  diet. The patient has been trying to gain weight by increasing calories and drinking protein shakes. Pt's weight will be monitored closely. Demonstration and practice of PLB using pulse oximeter. [name] able to return demonstration satisfactorily. Safety and hand hygiene in the exercise area reviewed with patient. [name] voices understanding of the information reviewed. Department expectations discussed with patient and achievable goals were set. The patient shows enthusiasm about attending the program and we look forward to working with Richard. Chosen completed a 6 min walk test today  and is scheduled to begin exercise on 12/2 at 0815.   [id number] Idolina Mantell

## 2024-06-03 DIAGNOSIS — I25119 Atherosclerotic heart disease of native coronary artery with unspecified angina pectoris: Secondary | ICD-10-CM | POA: Diagnosis not present

## 2024-06-03 DIAGNOSIS — I700 Atherosclerosis of aorta: Secondary | ICD-10-CM | POA: Diagnosis not present

## 2024-06-03 DIAGNOSIS — E038 Other specified hypothyroidism: Secondary | ICD-10-CM | POA: Diagnosis not present

## 2024-06-03 DIAGNOSIS — F330 Major depressive disorder, recurrent, mild: Secondary | ICD-10-CM | POA: Diagnosis not present

## 2024-06-03 DIAGNOSIS — J441 Chronic obstructive pulmonary disease with (acute) exacerbation: Secondary | ICD-10-CM | POA: Diagnosis not present

## 2024-06-04 NOTE — Addendum Note (Signed)
 Encounter addended by: Zulema Emmalene PARAS, RN on: 06/04/2024 3:33 PM  Actions taken: Imaging Exam ended

## 2024-06-05 ENCOUNTER — Encounter (HOSPITAL_COMMUNITY)

## 2024-06-05 ENCOUNTER — Encounter (HOSPITAL_COMMUNITY)
Admission: RE | Admit: 2024-06-05 | Discharge: 2024-06-05 | Disposition: A | Discharge status: Home / Self Care | Source: Ambulatory Visit | Attending: Pulmonary Disease | Admitting: Pulmonary Disease

## 2024-06-05 DIAGNOSIS — J984 Other disorders of lung: Secondary | ICD-10-CM | POA: Insufficient documentation

## 2024-06-05 NOTE — Progress Notes (Signed)
 Daily Session Note  Patient Details  Name: [name] MRN: [id number] Date of Birth: 1951-01-04 Referring Provider:   Conrad Ports Pulmonary Rehab Walk Test from 05/30/2024 in [facility]  Referring Provider Olalere    Encounter Date: 06/05/2024  Check In:  Session Check In - 06/05/24 0813       Check-In   Supervising physician immediately available to respond to emergencies [facility] MD immediately available    Physician(s) Damien Braver, NP    Location [facility]    Staff Present Ronal Levin, RN, BSN;Amram Maya Claudene Neita Moats, MS, ACSM-CEP, Exercise Physiologist    Virtual Visit No    Medication changes reported     No    Fall or balance concerns reported    Yes    Comments states he is unstead at times, no recent falls    Tobacco Cessation No Change    Warm-up and Cool-down Performed as group-led instruction   only   Resistance Training Performed Yes    VAD Patient? No    PAD/SET Patient? No      Pain Assessment   Currently in Pain? No/denies    Multiple Pain Sites No          Capillary Blood Glucose: No results found for this or any previous visit (from the past 24 hours).    Social History   Tobacco Use  Smoking Status Former   Current packs/day: 0.00   Average packs/day: 1.5 packs/day for 24.0 years (36.0 ttl pk-yrs)   Types: Cigarettes   Start date: 59   Quit date: 07/05/1985   Years since quitting: 38.9   Passive exposure: Never  Smokeless Tobacco Never    Goals Met:  Proper associated with RPD/PD & O2 Sat Independence with exercise equipment Exercise tolerated well No report of concerns or symptoms today Strength training completed today  Goals Unmet:  Not Applicable  Comments: Service time is from 0805 to [phone].    Dr. Slater Staff is Medical Director for Pulmonary Rehab at [facility].

## 2024-06-07 ENCOUNTER — Encounter (HOSPITAL_COMMUNITY)

## 2024-06-07 ENCOUNTER — Encounter (HOSPITAL_COMMUNITY)
Admission: RE | Admit: 2024-06-07 | Discharge: 2024-06-07 | Disposition: A | Source: Ambulatory Visit | Attending: Pulmonary Disease

## 2024-06-07 VITALS — Wt 169.8 lb

## 2024-06-07 DIAGNOSIS — J984 Other disorders of lung: Secondary | ICD-10-CM | POA: Diagnosis not present

## 2024-06-07 NOTE — Progress Notes (Signed)
 Daily Session Note  Patient Details  Name: [name] MRN: [id number] Date of Birth: 1951-05-23 Referring Provider:   Conrad Ports Pulmonary Rehab Walk Test from 05/30/2024 in [facility], V[facility], & Lung [facility]  Referring Provider Olalere    Encounter Date: 06/07/2024  Check In:  Session Check In - 06/07/24 9180       Check-In   Supervising physician immediately available to respond to emergencies [facility] immediately available    Physician(s) Rosaline Bane, NP    [facility]    Staff Present Ronal Levin, RN, BSN;Casey Claudene, Neita Moats, MS, ACSM-CEP, Exercise Physiologist;Randi Midge HECKLE, ACSM-CEP, Exercise Physiologist    Virtual Visit No    Medication changes reported     No    Fall or balance concerns reported    Yes    Comments states he is unstead at times, no recent falls    Tobacco Cessation No Change    Warm-up and Cool-down Performed as group-led instruction    Resistance Training Performed Yes    VAD Patient? No    PAD/SET Patient? No      Pain Assessment   Currently in Pain? No/denies    Multiple Pain Sites No          Capillary Blood Glucose: No results found for this or any previous visit (from the past 24 hours).    Social History   Tobacco Use  Smoking Status Former   Current packs/day: 0.00   Average packs/day: 1.5 packs/day for 24.0 years (36.0 ttl pk-yrs)   Types: Cigarettes   Start date: 22   Quit date: 07/05/1985   Years since quitting: 38.9   Passive exposure: Never  Smokeless Tobacco Never    Goals Met:  Independence with exercise equipment Exercise tolerated well Queuing for purse lip breathing No report of concerns or symptoms today Strength training completed today  Goals Unmet:  Not Applicable  Comments: Service time is from 0802 to 0911    Dr. Slater Staff is Medical Director for Pulmonary Rehab at [facility].

## 2024-06-08 ENCOUNTER — Encounter: Payer: Self-pay | Admitting: Internal Medicine

## 2024-06-08 DIAGNOSIS — J441 Chronic obstructive pulmonary disease with (acute) exacerbation: Secondary | ICD-10-CM

## 2024-06-08 DIAGNOSIS — J019 Acute sinusitis, unspecified: Secondary | ICD-10-CM

## 2024-06-11 ENCOUNTER — Telehealth (HOSPITAL_COMMUNITY): Payer: Self-pay

## 2024-06-11 NOTE — Telephone Encounter (Signed)
Dr. Ramaswamy, Please see patient comment and advise.  Thank you. 

## 2024-06-11 NOTE — Telephone Encounter (Signed)
 Called pt due to PR class on 06/12/24.  He will be coming to the 1015 class.

## 2024-06-12 ENCOUNTER — Encounter (HOSPITAL_COMMUNITY)

## 2024-06-12 ENCOUNTER — Encounter (HOSPITAL_COMMUNITY): Admission: RE | Admit: 2024-06-12 | Source: Ambulatory Visit

## 2024-06-12 MED ORDER — PREDNISONE 10 MG PO TABS: ORAL_TABLET | ORAL | 0 refills | Status: AC

## 2024-06-12 MED ORDER — AZITHROMYCIN 250 MG PO TABS: ORAL_TABLET | ORAL | 0 refills | Status: AC

## 2024-06-12 NOTE — Telephone Encounter (Signed)
  He was supposed to get lab work some weeks taking pred redo. AT this point without examingin all I can do is re-precible prednisone  and antibiotics and if symptoms get worse he has to go to ER or come in for acute visit ANd if not getting better needs acute visit   Plan  - Z PAK   Please take prednisone  40 mg x1 day, then 30 mg x1 day, then 20 mg x1 day, then 10 mg x1 day, and then 5 mg x1 day and stop  Pls let him know   Allergies  Allergen Reactions   Bupropion  Other (See Comments)    Caused altered mental status Altered mental status Caused altered mental status   Ciprofloxacin -Dexamethasone  Other (See Comments)    Burning in ears when Cipro  ear drops were used unknown Burning in ears when Cipro  ear drops were used   Gabapentin  Other (See Comments)    Caused weakness, dizziness, and forgetfulness  Weakness and dizziness with higher doses. Weakness and dizziness Caused weakness, dizziness, and forgetfulness   Ciprofloxacin  Hcl Other (See Comments)    Burning in ears when drops were used   Clonidine Hcl Other (See Comments)         Latest Reference Range & Units 05/24/07 16:53 10/24/07 20:39 09/12/08 20:10 09/21/10 14:38 06/08/11 12:02 08/17/12 19:26 08/21/12 06:10 09/04/15 10:12 09/05/15 07:46 10/20/15 16:11 03/15/16 20:39 12/29/18 16:07 06/19/19 09:02 07/17/19 14:21 08/17/19 15:17 07/18/20 11:46 11/17/21 11:59 08/15/23 11:22 03/26/24 11:30 05/07/24 09:29  Eosinophils Absolute 0.0 - 0.5 K/uL 0.2 0.1 0.0 0.1 0.2 0.1 0.2 0.0 0.1 0.1 0.1 0.2 0.5 0.4 0.2 0.2 0.2 0.1 0.1 0.1     Latest Reference Range & Units 09/05/15 12:13 07/17/19 14:21 08/17/19 15:17 11/14/20 00:00  RA Latex Turbid. <14 IU/mL 36.8 (H) 575 (H) 206.6 (H) 372 (H)  (H): Data is abnormally high

## 2024-06-14 ENCOUNTER — Encounter (HOSPITAL_COMMUNITY)
Admission: RE | Admit: 2024-06-14 | Discharge: 2024-06-14 | Disposition: A | Discharge status: Home / Self Care | Source: Ambulatory Visit | Attending: Pulmonary Disease | Admitting: Pulmonary Disease

## 2024-06-14 ENCOUNTER — Encounter (HOSPITAL_COMMUNITY)

## 2024-06-14 DIAGNOSIS — D696 Thrombocytopenia, unspecified: Secondary | ICD-10-CM | POA: Diagnosis not present

## 2024-06-14 DIAGNOSIS — J984 Other disorders of lung: Secondary | ICD-10-CM | POA: Diagnosis not present

## 2024-06-14 DIAGNOSIS — R82998 Other abnormal findings in urine: Secondary | ICD-10-CM | POA: Diagnosis not present

## 2024-06-14 DIAGNOSIS — R5382 Chronic fatigue, unspecified: Secondary | ICD-10-CM | POA: Diagnosis not present

## 2024-06-14 DIAGNOSIS — R252 Cramp and spasm: Secondary | ICD-10-CM | POA: Diagnosis not present

## 2024-06-14 NOTE — Progress Notes (Signed)
 Daily Session Note  Patient Details  Name: [name] MRN: [id number] Date of Birth: September 10, 1950 Referring Provider:   Conrad Ports Pulmonary Rehab Walk Test from 05/30/2024 in [facility], Vascular, & [facility]  Referring Provider Olalere    Encounter Date: 06/14/2024  Check In:  Session Check In - 06/14/24 0817       Check-In   Supervising physician immediately available to respond to emergencies [facility] immediately available    Physician(s) Lum Louis, NP    [facility]    Staff Present Ronal Levin, RN, BSN;Gladstone Rosas, Neita Moats, MS, ACSM-CEP, Exercise Physiologist    Virtual Visit No    Medication changes reported     No    Fall or balance concerns reported    Yes    Comments states he is unstead at times, no recent falls    Tobacco Cessation No Change    Warm-up and Cool-down Performed as group-led instruction    Resistance Training Performed Yes    VAD Patient? No    PAD/SET Patient? No      Pain Assessment   Currently in Pain? No/denies    Multiple Pain Sites No          Capillary Blood Glucose: No results found for this or any previous visit (from the past 24 hours).    Tobacco Use History[1]  Goals Met:  Proper associated with RPD/PD & O2 Sat Independence with exercise equipment Exercise tolerated well No report of concerns or symptoms today Strength training completed today  Goals Unmet:  Not Applicable  Comments: Service time is from 0814 to 0921.    Dr. Slater Staff is Medical Director for Pulmonary Rehab at [facility].     [1]  Social History Tobacco Use  Smoking Status Former   Current packs/day: 0.00   Average packs/day: 1.5 packs/day for 24.0 years (36.0 ttl pk-yrs)   Types: Cigarettes   Start date: 74   Quit date: 07/05/1985   Years since quitting: 38.9   Passive exposure: Never  Smokeless Tobacco Never

## 2024-06-19 ENCOUNTER — Encounter (HOSPITAL_COMMUNITY)

## 2024-06-19 ENCOUNTER — Encounter (HOSPITAL_COMMUNITY)
Admission: RE | Admit: 2024-06-19 | Discharge: 2024-06-19 | Disposition: A | Source: Ambulatory Visit | Attending: Pulmonary Disease

## 2024-06-19 DIAGNOSIS — J984 Other disorders of lung: Secondary | ICD-10-CM

## 2024-06-19 NOTE — Progress Notes (Signed)
 Daily Session Note  Patient Details  Name: [name] MRN: [id number] Date of Birth: 29-Sep-1950 Referring Provider:   Conrad Ports Pulmonary Rehab Walk Test from 05/30/2024 in [facility] for [facility], Vascular, & Lung [facility]  Referring Provider Olalere    Encounter Date: 06/19/2024  Check In:  Session Check In - 06/19/24 0834       Check-In   Supervising physician immediately available to respond to emergencies [facility] immediately available    Physician(s) Orren Fabry, NP    [facility]    Staff Present Ronal Levin, RN, BSN;Johnye Kist Claudene Neita Moats, MS, ACSM-CEP, Exercise Physiologist    Virtual Visit No    Medication changes reported     No    Fall or balance concerns reported    Yes    Comments states he is unstead at times, no recent falls    Tobacco Cessation No Change    Warm-up and Cool-down Performed as group-led instruction    Resistance Training Performed Yes    VAD Patient? No    PAD/SET Patient? No      Pain Assessment   Currently in Pain? No/denies    Multiple Pain Sites No          Capillary Blood Glucose: No results found for this or any previous visit (from the past 24 hours).    Tobacco Use History[1]  Goals Met:  Proper associated with RPD/PD & O2 Sat Independence with exercise equipment Exercise tolerated well No report of concerns or symptoms today Strength training completed today  Goals Unmet:  Not Applicable  Comments: Service time is from 0807 to 0923.    Dr. Slater Staff is Medical Director for Pulmonary Rehab at [facility]     [1]  Social History Tobacco Use  Smoking Status Former   Current packs/day: 0.00   Average packs/day: 1.5 packs/day for 24.0 years (36.0 ttl pk-yrs)   Types: Cigarettes   Start date: 65   Quit date: 07/05/1985   Years since quitting: 38.9   Passive exposure: Never  Smokeless Tobacco Never

## 2024-06-21 ENCOUNTER — Encounter (HOSPITAL_COMMUNITY): Admission: RE | Admit: 2024-06-21

## 2024-06-21 ENCOUNTER — Encounter (HOSPITAL_COMMUNITY)

## 2024-06-21 VITALS — Wt 169.1 lb

## 2024-06-21 DIAGNOSIS — J984 Other disorders of lung: Secondary | ICD-10-CM | POA: Diagnosis not present

## 2024-06-21 NOTE — Progress Notes (Signed)
 Daily Session Note  Patient Details  Name: [name] MRN: [id number] Date of Birth: 1950/07/06 Referring Provider:   Conrad Ports Pulmonary Rehab Walk Test from 05/30/2024 in [facility], Vascular, & [facility]  Referring Provider Olalere    Encounter Date: 06/21/2024  Check In:  Session Check In - 06/21/24 0817       Check-In   Supervising physician immediately available to respond to emergencies [facility] immediately available    Physician(s) Barnie Hila, NP    Location [facility]    Staff Present Ronal Levin, RN, BSN;Jamea Robicheaux Claudene Neita Moats, MS, ACSM-CEP, Exercise Physiologist    Virtual Visit No    Medication changes reported     No    Fall or balance concerns reported    Yes    Comments states he is unstead at times, no recent falls    Tobacco Cessation No Change    Warm-up and Cool-down Performed as group-led instruction    Resistance Training Performed Yes    VAD Patient? No    PAD/SET Patient? No      Pain Assessment   Currently in Pain? No/denies    Multiple Pain Sites No          Capillary Blood Glucose: No results found for this or any previous visit (from the past 24 hours).    Tobacco Use History[1]  Goals Met:  Proper associated with RPD/PD & O2 Sat Independence with exercise equipment Exercise tolerated well No report of concerns or symptoms today Strength training completed today  Goals Unmet:  Not Applicable  Comments: Service time is from 0811 to [phone].    Dr. Slater Staff is Medical Director for Pulmonary Rehab at [facility].     [1]  Social History Tobacco Use  Smoking Status Former   Current packs/day: 0.00   Average packs/day: 1.5 packs/day for 24.0 years (36.0 ttl pk-yrs)   Types: Cigarettes   Start date: 55   Quit date: 07/05/1985   Years since quitting: 38.9   Passive exposure: Never  Smokeless Tobacco Never

## 2024-06-25 ENCOUNTER — Telehealth (HOSPITAL_COMMUNITY): Payer: Self-pay

## 2024-06-25 NOTE — Telephone Encounter (Signed)
 Patient c/o for 8:15 PR class on 12/23, has a dr appt for an ear infection.

## 2024-06-26 ENCOUNTER — Encounter (HOSPITAL_COMMUNITY)

## 2024-06-27 NOTE — Progress Notes (Signed)
 Pulmonary Individual Treatment Plan  Patient Details  Name: [name] MRN: [id number] Date of Birth: [birth year] Referring Provider:   Conrad Ports Pulmonary Rehab Walk Test from 05/30/2024 in [facility] for Heart, Vascular, & Lung Health  Referring Provider Olalere    Initial Encounter Date:  Flowsheet Row Pulmonary Rehab Walk Test from 05/30/2024 in [facility] for Heart, Vascular, & Lung Health  Date 05/30/24    Visit Diagnosis: Restrictive lung disease  Patient's Home Medications on Admission:  Current Medications[1]  Past Medical History: Past Medical History:  Diagnosis Date   Anxiety 1988   Arthritis    Bronchitis    history of   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 last stress test 2003   Pneumonia 2009   Primary immune deficiency disorder    Tumor cells, benign    lung, left side   Unstable angina (HCC) 10/28/2017    Tobacco Use: Tobacco Use History[2]  Labs: Review Flowsheet  More data exists      Latest Ref Rng & Units 10/21/2015 10/28/2017 10/29/2017 02/13/2018 12/29/2018  Labs for ITP Cardiac and Pulmonary Rehab  Cholestrol 100 - 199 mg/dL 810  - 871  865  -  LDL (calc) 0 - 99 mg/dL 875  - 76  66  -  HDL-C >39 mg/dL 43  - 38  41  -  Trlycerides 0 - 149 mg/dL 889  - 69  862  -  Hemoglobin A1c 4.8 - 5.6 % 5.9  5.4  - - -  TCO2 22 - 32 mmol/L - - - - 30     Capillary Blood Glucose: Lab Results  Component Value Date   GLUCAP 152 (H) 12/29/2018   GLUCAP 136  (H) 03/15/2016   GLUCAP 102 (H) 09/05/2015   GLUCAP 84 09/05/2015   GLUCAP 121 (H) 09/04/2015     Pulmonary Assessment Scores:  Pulmonary Assessment Scores     Row Name 05/30/24 1056         ADL UCSD   ADL Phase Entry     SOB Score total 31       CAT Score   CAT Score 16       mMRC Score   mMRC Score 4       UCSD: Self-administered rating of dyspnea associated with activities of daily living (ADLs) 6-point scale (0 = not at all to 5 = maximal or unable to do because of breathlessness)  Scoring Scores range from 0 to 120.  Minimally important difference is 5 units  CAT: CAT can identify the health impairment of COPD patients and is better correlated with disease progression.  CAT has a scoring range of zero to 40. The CAT score is classified into four groups of low (less than 10), medium (10 - 20), high (21-30) and very high (31-40) based on the impact level of disease on health status. A CAT score over  10 suggests significant symptoms.  A worsening CAT score could be explained by an exacerbation, poor medication adherence, poor inhaler technique, or progression of COPD or comorbid conditions.  CAT MCID is 2 points  mMRC: mMRC (Modified Medical Research Council) Dyspnea Scale is used to assess the degree of baseline functional disability in patients of respiratory disease due to dyspnea. No minimal important difference is established. A decrease in score of 1 point or greater is considered a positive change.   Pulmonary Function Assessment:  Pulmonary Function Assessment - 05/30/24 1056       Breath   Bilateral Breath Sounds Clear    Shortness of Breath Yes;Limiting activity          Exercise Target Goals: Exercise Program Goal: Individual exercise prescription set using results from initial 6 min walk test and THRR while considering  patients activity barriers and safety.   Exercise Prescription Goal: Initial exercise prescription builds to 30-45 minutes a  day of aerobic activity, 2-3 days per week.  Home exercise guidelines will be given to patient during program as part of exercise prescription that the participant will acknowledge.  Activity Barriers & Risk Stratification:  Activity Barriers & Cardiac Risk Stratification - 05/30/24 1055       Activity Barriers & Cardiac Risk Stratification   Activity Barriers Arthritis;Deconditioning;Left Hip Replacement;Shortness of Breath;Muscular Weakness;Balance Concerns          6 Minute Walk:  6 Minute Walk     Row Name 05/30/24 1145         6 Minute Walk   Phase Initial     Distance 1137 feet     Walk Time 6 minutes     # of Rest Breaks 0     MPH 2.15     METS 1.6     RPE 13     Perceived Dyspnea  2     VO2 Peak 5.62     Symptoms No     Resting HR 69 bpm     Resting BP 92/58     Resting Oxygen  Saturation  100 %     Exercise Oxygen  Saturation  during 6 min walk 96 %     Max Ex. HR 77 bpm     Max Ex. BP 102/58     2 Minute Post BP 98/60       Interval HR   1 Minute HR 74     2 Minute HR 77     3 Minute HR 73     4 Minute HR 71     5 Minute HR 72     6 Minute HR 73     2 Minute Post HR 57     Interval Heart Rate? Yes       Interval Oxygen    Interval Oxygen ? Yes     Baseline Oxygen  Saturation % 100 %     1 Minute Oxygen  Saturation % 98 %     1 Minute Liters of Oxygen  0 L     2 Minute Oxygen  Saturation % 98 %     2 Minute Liters of Oxygen  0 L     3 Minute Oxygen  Saturation % 98 %     3 Minute Liters of Oxygen  0 L     4 Minute Oxygen  Saturation % 96 %     4 Minute Liters of Oxygen  0 L     5 Minute Oxygen  Saturation % 96 %     5 Minute Liters  of Oxygen  0 L     6 Minute Oxygen  Saturation % 97 %     6 Minute Liters of Oxygen  0 L     2 Minute Post Oxygen  Saturation % 99 %     2 Minute Post Liters of Oxygen  0 L        Oxygen  Initial Assessment:  Oxygen  Initial Assessment - 05/30/24 1056       Home Oxygen    Home Oxygen  Device None    Sleep Oxygen  Prescription  None    Home Exercise Oxygen  Prescription None    Home Resting Oxygen  Prescription None      Initial 6 min Walk   Oxygen  Used None      Program Oxygen  Prescription   Program Oxygen  Prescription None      Intervention   Short Term Goals To learn and understand importance of maintaining oxygen  saturations>88%;To learn and understand importance of monitoring SPO2 with pulse oximeter and demonstrate accurate use of the pulse oximeter.;To learn and demonstrate proper pursed lip breathing techniques or other breathing techniques.     Long  Term Goals Maintenance of O2 saturations>88%;Verbalizes importance of monitoring SPO2 with pulse oximeter and return demonstration;Exhibits proper breathing techniques, such as pursed lip breathing or other method taught during program session          Oxygen  Re-Evaluation:  Oxygen  Re-Evaluation     Row Name 06/14/24 1624             Program Oxygen  Prescription   Program Oxygen  Prescription None         Home Oxygen    Home Oxygen  Device None       Sleep Oxygen  Prescription None       Home Exercise Oxygen  Prescription None       Home Resting Oxygen  Prescription None         Goals/Expected Outcomes   Short Term Goals To learn and understand importance of maintaining oxygen  saturations>88%;To learn and understand importance of monitoring SPO2 with pulse oximeter and demonstrate accurate use of the pulse oximeter.;To learn and demonstrate proper pursed lip breathing techniques or other breathing techniques.        Long  Term Goals Maintenance of O2 saturations>88%;Verbalizes importance of monitoring SPO2 with pulse oximeter and return demonstration;Exhibits proper breathing techniques, such as pursed lip breathing or other method taught during program session       Goals/Expected Outcomes Compliance and understanding of oxygen  saturation monitoring and breathing techniques to decrease shortness of breath.          Oxygen  Discharge (Final Oxygen   Re-Evaluation):  Oxygen  Re-Evaluation - 06/14/24 1624       Program Oxygen  Prescription   Program Oxygen  Prescription None      Home Oxygen    Home Oxygen  Device None    Sleep Oxygen  Prescription None    Home Exercise Oxygen  Prescription None    Home Resting Oxygen  Prescription None      Goals/Expected Outcomes   Short Term Goals To learn and understand importance of maintaining oxygen  saturations>88%;To learn and understand importance of monitoring SPO2 with pulse oximeter and demonstrate accurate use of the pulse oximeter.;To learn and demonstrate proper pursed lip breathing techniques or other breathing techniques.     Long  Term Goals Maintenance of O2 saturations>88%;Verbalizes importance of monitoring SPO2 with pulse oximeter and return demonstration;Exhibits proper breathing techniques, such as pursed lip breathing or other method taught during program session    Goals/Expected Outcomes Compliance and understanding of oxygen  saturation monitoring  and breathing techniques to decrease shortness of breath.          Initial Exercise Prescription:  Initial Exercise Prescription - 05/30/24 1100       Date of Initial Exercise RX and Referring Provider   Date 05/30/24    Referring Provider Olalere    Expected Discharge Date 09/04/24      Recumbant Bike   Level 1    RPM 80    Watts 90    Minutes 15    METs 2.5      Recumbant Elliptical   Level 1    RPM 70    Watts 80    Minutes 15    METs 3      Prescription Details   Frequency (times per week) 2    Duration Progress to 30 minutes of continuous aerobic without signs/symptoms of physical distress      Intensity   THRR 40-80% of Max Heartrate 58-117    Ratings of Perceived Exertion 11-13    Perceived Dyspnea 0-4      Progression   Progression Continue to progress workloads to maintain intensity without signs/symptoms of physical distress.      Resistance Training   Training Prescription Yes    Weight Blue bands     Reps 10-15          Perform Capillary Blood Glucose checks as needed.  Exercise Prescription Changes:   Exercise Prescription Changes     Row Name 06/07/24 1145 06/21/24 0827           Response to Exercise   Blood Pressure (Admit) 98/50 90/54      Blood Pressure (Exit) 92/50 98/54      Heart Rate (Admit) 80 bpm 73 bpm      Heart Rate (Exercise) 100 bpm 86 bpm      Heart Rate (Exit) 95 bpm 91 bpm      Oxygen  Saturation (Admit) 97 % 99 %      Oxygen  Saturation (Exercise) 94 % 99 %      Oxygen  Saturation (Exit) 95 % 96 %      Rating of Perceived Exertion (Exercise) 13 13      Perceived Dyspnea (Exercise) 2 1      Duration Continue with 30 min of aerobic exercise without signs/symptoms of physical distress. Continue with 30 min of aerobic exercise without signs/symptoms of physical distress.      Intensity THRR unchanged THRR unchanged        Progression   Progression Continue to progress workloads to maintain intensity without signs/symptoms of physical distress. Continue to progress workloads to maintain intensity without signs/symptoms of physical distress.        Resistance Training   Training Prescription Yes --      Weight Blue bands Blue bands      Reps 10-15 10-15      Time 10 Minutes 10 Minutes        Recumbant Bike   Level 1 3      Minutes 15 15      METs 1.9 2.8        Recumbant Elliptical   Level 2 2      Minutes 15 15      METs 3 3         Exercise Comments:   Exercise Comments     Row Name 06/05/24 [phone]           Exercise Comments Pt completed first day of exercise.  Bob exercised for 15 min on the Octane and recumbent bike. He averages 2.9 METs on the Octane and 1.9 on the recumbent bike. He performed the warmup and cooldown standing holding onto a chair for balance. Discussed METs with good reception.          Exercise Goals and Review:   Exercise Goals     Row Name 05/30/24 1055             Exercise Goals   Increase Physical  Activity Yes       Intervention Provide advice, education, support and counseling about physical activity/exercise needs.;Develop an individualized exercise prescription for aerobic and resistive training based on initial evaluation findings, risk stratification, comorbidities and participant's personal goals.       Expected Outcomes Short Term: Attend rehab on a regular basis to increase amount of physical activity.;Long Term: Exercising regularly at least 3-5 days a week.;Long Term: Add in home exercise to make exercise part of routine and to increase amount of physical activity.       Increase Strength and Stamina Yes       Intervention Provide advice, education, support and counseling about physical activity/exercise needs.;Develop an individualized exercise prescription for aerobic and resistive training based on initial evaluation findings, risk stratification, comorbidities and participant's personal goals.       Expected Outcomes Short Term: Increase workloads from initial exercise prescription for resistance, speed, and METs.;Short Term: Perform resistance training exercises routinely during rehab and add in resistance training at home;Long Term: Improve cardiorespiratory fitness, muscular endurance and strength as measured by increased METs and functional capacity ( )       Able to understand and use rate of perceived exertion (RPE) scale Yes       Intervention Provide education and explanation on how to use RPE scale       Expected Outcomes Short Term: Able to use RPE daily in rehab to express subjective intensity level;Long Term:  Able to use RPE to guide intensity level when exercising independently       Able to understand and use Dyspnea scale Yes       Intervention Provide education and explanation on how to use Dyspnea scale       Expected Outcomes Short Term: Able to use Dyspnea scale daily in rehab to express subjective sense of shortness of breath during exertion;Long Term: Able to  use Dyspnea scale to guide intensity level when exercising independently       Knowledge and understanding of Target Heart Rate Range (THRR) Yes       Intervention Provide education and explanation of THRR including how the numbers were predicted and where they are located for reference       Expected Outcomes Short Term: Able to state/look up THRR;Long Term: Able to use THRR to govern intensity when exercising independently;Short Term: Able to use daily as guideline for intensity in rehab       Understanding of Exercise Prescription Yes       Intervention Provide education, explanation, and written materials on patient's individual exercise prescription       Expected Outcomes Short Term: Able to explain program exercise prescription;Long Term: Able to explain home exercise prescription to exercise independently          Exercise Goals Re-Evaluation :  Exercise Goals Re-Evaluation     Row Name 06/14/24 1622             Exercise Goal Re-Evaluation   Exercise Goals Review Increase  Physical Activity;Able to understand and use Dyspnea scale;Understanding of Exercise Prescription;Increase Strength and Stamina;Knowledge and understanding of Target Heart Rate Range (THRR);Able to understand and use rate of perceived exertion (RPE) scale       Comments [name] has completed 3 exercise sessions. He exercises for 15 min on the Octane and recumbent bike. He averages 3.3 METs at level 2 on the Octane and 1.9 METs at level 1 on the Nustep. It is too soon to notate any discernable progressions. Will continue to monitor and progress as able.       Expected Outcomes Through exercise at rehab and at home, the patient will decrease shortness of breath with daily activities and feel confident in carrying out an exercise regimen at home.          Discharge Exercise Prescription (Final Exercise Prescription Changes):  Exercise Prescription Changes - 06/21/24 0827       Response to Exercise   Blood Pressure  (Admit) 90/54    Blood Pressure (Exit) 98/54    Heart Rate (Admit) 73 bpm    Heart Rate (Exercise) 86 bpm    Heart Rate (Exit) 91 bpm    Oxygen  Saturation (Admit) 99 %    Oxygen  Saturation (Exercise) 99 %    Oxygen  Saturation (Exit) 96 %    Rating of Perceived Exertion (Exercise) 13    Perceived Dyspnea (Exercise) 1    Duration Continue with 30 min of aerobic exercise without signs/symptoms of physical distress.    Intensity THRR unchanged      Progression   Progression Continue to progress workloads to maintain intensity without signs/symptoms of physical distress.      Resistance Training   Weight Blue bands    Reps 10-15    Time 10 Minutes      Recumbant Bike   Level 3    Minutes 15    METs 2.8      Recumbant Elliptical   Level 2    Minutes 15    METs 3          Nutrition:  Target Goals: Understanding of nutrition guidelines, daily intake of sodium 1500mg , cholesterol 200mg , calories 30% from fat and 7% or less from saturated fats, daily to have 5 or more servings of fruits and vegetables.  Biometrics:    Nutrition Therapy Plan and Nutrition Goals:  Nutrition Therapy & Goals - 06/05/24 0847       Nutrition Therapy   Diet Regular diet    Drug/Food Interactions Statins/Certain Fruits      Personal Nutrition Goals   Nutrition Goal Patient to identify strategies for weight maintenance/healthy weight gain of 0.5-2# per week.    Comments Patient with medical history significant for CAD, CVA, emphysema, GERD, hypothyroidism. Reports weight loss of 15 lb over past 10 months which he associates with thrombocytopenia. Recent wt gain of 3 lb noted over past month. Current BMI within appropriate range for >= 65 years. Patient endorses eating 3 meals daily which include Raisin Bran with skim milk, peanut butter and jelly sandwich with yogurt, and beef/Brunswick stew. Snacks include ice cream with strawberries. Supplements diet with Premier shake X 2 daily between meals. Pt  reports some early satiety. May promote caloric intake by switching to higher calorie oral nutrition supplement such as Ensure Plus. RD encouraged pt to include high protein foods with meals/snacks. Pt reports general stomach discomfort; may benefit from small, frequent meals to avoid exacerbation.      Intervention Plan   Intervention  Prescribe, educate and counsel regarding individualized specific dietary modifications aiming towards targeted core components such as weight, hypertension, lipid management, diabetes, heart failure and other comorbidities.;Nutrition handout(s) given to patient.   Handouts: Weight Gain and Weight Maintenance, Lean Protein for COPD   Expected Outcomes Short Term Goal: Understand basic principles of dietary content, such as calories, fat, sodium, cholesterol and nutrients.;Long Term Goal: Adherence to prescribed nutrition plan.          Nutrition Assessments:  MEDIFICTS Score Key: >=70 Need to make dietary changes  40-70 Heart Healthy Diet <= 40 Therapeutic Level Cholesterol Diet  Flowsheet Row PULMONARY REHAB OTHER RESPIRATORY from 06/07/2024 in [facility], Vascular, & Lung [facility]  Picture Your Plate Total Score on Admission 74   Picture Your Plate Scores: <59 Unhealthy dietary pattern with much room for improvement. 41-50 Dietary pattern unlikely to meet recommendations for good health and room for improvement. 51-60 More healthful dietary pattern, with some room for improvement.  >60 Healthy dietary pattern, although there may be some specific behaviors that could be improved.    Nutrition Goals Re-Evaluation:   Nutrition Goals Discharge (Final Nutrition Goals Re-Evaluation):   Psychosocial: Target Goals: Acknowledge presence or absence of significant depression and/or stress, maximize coping skills, provide positive support system. Participant is able to verbalize types and ability to use techniques and skills  needed for reducing stress and depression.  Initial Review & Psychosocial Screening:  Initial Psych Review & Screening - 05/30/24 1101       Initial Review   Current issues with History of Depression;Current Psychotropic Meds      Family Dynamics   Good Support System? Yes    Comments Support from wife and children/family, denies any additional education, resources or referrals at this time      Barriers   Psychosocial barriers to participate in program There are no identifiable barriers or psychosocial needs.      Screening Interventions   Interventions Encouraged to exercise;Provide feedback about the scores to participant    Expected Outcomes Short Term goal: Utilizing psychosocial counselor, staff and physician to assist with identification of specific Stressors or current issues interfering with healing process. Setting desired goal for each stressor or current issue identified.;Long Term Goal: Stressors or current issues are controlled or eliminated.;Short Term goal: Identification and review with participant of any Quality of Life or Depression concerns found by scoring the questionnaire.;Long Term goal: The participant improves quality of Life and PHQ9 Scores as seen by post scores and/or verbalization of changes          Quality of Life Scores:  Scores of 19 and below usually indicate a poorer quality of life in these areas.  A difference of  2-3 points is a clinically meaningful difference.  A difference of 2-3 points in the total score of the Quality of Life Index has been associated with significant improvement in overall quality of life, self-image, physical symptoms, and general health in studies assessing change in quality of life.  PHQ-9: Review Flowsheet  More data may exist      05/30/2024 03/26/2024 12/29/2021 10/05/2021 12/12/2019  Depression screen PHQ 2/9  Decreased Interest 0 0 0 0 0  Down, Depressed, Hopeless 0 0 0 0 0  PHQ - 2 Score 0 0 0 0 0  Altered sleeping  2 - 2 0 3  Tired, decreased energy 3 - 0 1 3  Change in appetite 2 - 1 0 1  Feeling bad  or failure about yourself  0 - 0 0 0  Trouble concentrating 0 - 0 0 0  Moving slowly or fidgety/restless 0 - 0 0 0  Suicidal thoughts 0 - 0 0 0  PHQ-9 Score 7 - 3  1  -  Difficult doing work/chores Not difficult at all - Not difficult at all Not difficult at all Not difficult at all    Details       Data saved with a previous flowsheet row definition        Interpretation of Total Score  Total Score Depression Severity:  1-4 = Minimal depression, 5-9 = Mild depression, 10-14 = Moderate depression, 15-19 = Moderately severe depression, 20-27 = Severe depression   Psychosocial Evaluation and Intervention:  Psychosocial Evaluation - 05/30/24 1102       Psychosocial Evaluation & Interventions   Interventions Encouraged to exercise with the program and follow exercise prescription    Comments Pt scored high on PHQ2/9, scores 0/7. Pt states he has a history of ADHD and depression but states it is and has been well managed for many years. Pt is on medication and sees his therapist regularly. He states his wife and he having been watching their 7 grandchildren for the last 18 years. He stated the youngest one is 74 yrs old and is looking forward for him to attend school so he will get a break from babysitting. He also stated his wife is in a wheelchair and he is the primary caregiver for her. He denies caregiver fatigue. We will continue to assess and monitor his needs throughout the program.    Expected Outcomes For [name] to attend Pulm Rehab without any psy/soc barriers or concerns. For his history of depression to stay well managed.    Continue Psychosocial Services  Follow up required by staff          Psychosocial Re-Evaluation:  Psychosocial Re-Evaluation     Row Name 06/15/24 [phone]             Psychosocial Re-Evaluation   Current issues with History of Depression;Current Psychotropic Meds        Comments [name] has attended 3 sessions so far. He denies any new psy/soc barriers or concerns at this time. He continues to be compliant with taking his psychotropic meds and continues to work with his mental health therapist.       Expected Outcomes For [name] to continue to attend PR without any psychosocial concerns or issues.       Interventions Encouraged to attend Pulmonary Rehabilitation for the exercise       Continue Psychosocial Services  Follow up required by staff          Psychosocial Discharge (Final Psychosocial Re-Evaluation):  Psychosocial Re-Evaluation - 06/15/24 9147       Psychosocial Re-Evaluation   Current issues with History of Depression;Current Psychotropic Meds    Comments [name] has attended 3 sessions so far. He denies any new psy/soc barriers or concerns at this time. He continues to be compliant with taking his psychotropic meds and continues to work with his mental health therapist.    Expected Outcomes For [name] to continue to attend PR without any psychosocial concerns or issues.    Interventions Encouraged to attend Pulmonary Rehabilitation for the exercise    Continue Psychosocial Services  Follow up required by staff          Education: Education Goals: Education classes will be provided on a weekly basis, covering required  topics. Participant will state understanding/return demonstration of topics presented.  Learning Barriers/Preferences:   Education Topics: Know Your Numbers Group instruction that is supported by a PowerPoint presentation. Instructor discusses importance of knowing and understanding resting, exercise, and post-exercise oxygen  saturation, heart rate, and blood pressure. Oxygen  saturation, heart rate, blood pressure, rating of perceived exertion, and dyspnea are reviewed along with a normal range for these values.  Flowsheet Row PULMONARY REHAB OTHER RESPIRATORY from 06/21/2024 in [facility] for Heart, Vascular,  & Lung Health  Date 06/21/24  Educator EP  Instruction Review Code 1- Verbalizes Understanding    Exercise for the Pulmonary Patient Group instruction that is supported by a PowerPoint presentation. Instructor discusses benefits of exercise, core components of exercise, frequency, duration, and intensity of an exercise routine, importance of utilizing pulse oximetry during exercise, safety while exercising, and options of places to exercise outside of rehab.  Flowsheet Row PULMONARY REHAB OTHER RESPIRATORY from 06/14/2024 in [facility] for Heart, Vascular, & Lung Health  Date 06/14/24  Educator EP  Instruction Review Code 1- Verbalizes Understanding    MET Level  Group instruction provided by PowerPoint, verbal discussion, and written material to support subject matter. Instructor reviews what METs are and how to increase METs.    Pulmonary Medications Verbally interactive group education provided by instructor with focus on inhaled medications and proper administration. Flowsheet Row PULMONARY REHAB OTHER RESPIRATORY from 06/07/2024 in [facility] for Heart, Vascular, & Lung Health  Date 06/07/24  Educator RT  Instruction Review Code 1- Verbalizes Understanding    Anatomy and Physiology of the Respiratory System Group instruction provided by PowerPoint, verbal discussion, and written material to support subject matter. Instructor reviews respiratory cycle and anatomical components of the respiratory system and their functions. Instructor also reviews differences in obstructive and restrictive respiratory diseases with examples of each.    Oxygen  Safety Group instruction provided by PowerPoint, verbal discussion, and written material to support subject matter. There is an overview of What is Oxygen  and Why do we need it.  Instructor also reviews how to create a safe environment for oxygen  use, the importance of using oxygen  as  prescribed, and the risks of noncompliance. There is a brief discussion on traveling with oxygen  and resources the patient may utilize. Flowsheet Row PULMONARY REHAB OTHER RESPIRATORY from 12/24/2021 in [facility] for Heart, Vascular, & Lung Health  Date 12/10/21  Educator Johnnie  [Handout]  Instruction Review Code 1- Verbalizes Understanding    Oxygen  Use Group instruction provided by PowerPoint, verbal discussion, and written material to discuss how supplemental oxygen  is prescribed and different types of oxygen  supply systems. Resources for more information are provided.    Breathing Techniques Group instruction that is supported by demonstration and informational handouts. Instructor discusses the benefits of pursed lip and diaphragmatic breathing and detailed demonstration on how to perform both.     Risk Factor Reduction Group instruction that is supported by a PowerPoint presentation. Instructor discusses the definition of a risk factor, different risk factors for pulmonary disease, and how the heart and lungs work together.   Pulmonary Diseases Group instruction provided by PowerPoint, verbal discussion, and written material to support subject matter. Instructor gives an overview of the different type of pulmonary diseases. There is also a discussion on risk factors and symptoms as well as ways to manage the diseases.   Stress and Energy Conservation Group instruction provided by PowerPoint, verbal discussion,  and written material to support subject matter. Instructor gives an overview of stress and the impact it can have on the body. Instructor also reviews ways to reduce stress. There is also a discussion on energy conservation and ways to conserve energy throughout the day.   Warning Signs and Symptoms Group instruction provided by PowerPoint, verbal discussion, and written material to support subject matter. Instructor reviews warning signs and symptoms  of stroke, heart attack, cold and flu. Instructor also reviews ways to prevent the spread of infection.   Other Education Group or individual verbal, written, or video instructions that support the educational goals of the pulmonary rehab program. Flowsheet Row PULMONARY REHAB OTHER RESPIRATORY from 12/24/2021 in [facility] for Heart, Vascular, & Lung Health  Date 12/03/21  [facility] Levels]  Educator Johnnie  [Handout oxygen  use]  Instruction Review Code 1- Verbalizes Understanding     Knowledge Questionnaire Score:  Knowledge Questionnaire Score - 05/30/24 1059       Knowledge Questionnaire Score   Pre Score 17/18          Core Components/Risk Factors/Patient Goals at Admission:  Personal Goals and Risk Factors at Admission - 05/30/24 1108       Core Components/Risk Factors/Patient Goals on Admission    Weight Management Weight Maintenance;Yes (P)     Intervention Weight Management: Develop a combined nutrition and exercise program designed to reach desired caloric intake, while maintaining appropriate intake of nutrient and fiber, sodium and fats, and appropriate energy expenditure required for the weight goal.;Weight Management: Provide education and appropriate resources to help participant work on and attain dietary goals.;Weight Management/Obesity: Establish reasonable short term and long term weight goals. (P)     Admit Weight 169 lb 12.1 oz (77 kg) (P)           Core Components/Risk Factors/Patient Goals Review:   Goals and Risk Factor Review     Row Name 06/15/24 0903             Core Components/Risk Factors/Patient Goals Review   Personal Goals Review Improve shortness of breath with ADL's;Develop more efficient breathing techniques such as purse lipped breathing and diaphragmatic breathing and practicing self-pacing with activity.       Review Monthly review of patients Core Components/Risk Factors/Patient Goals are as follows: Goal  progressing for improving shortness of breath. [name] is currently exercising on RA to keep sats > 88%. He is exercising on the recumbant elliptical and the recumbant bike. Goal progressing for developing more efficient breathing techniques such as purse lipped breathing and diaphragmatic breathing; and practicing self-pacing with activity. We will continue to monitor Bob's progress throughout the program.       Expected Outcomes To improve shortness of breath with ADL's and develop more efficient breathing techniques such as purse lipped breathing and diaphragmatic breathing; and practicing self-pacing with activity.          Core Components/Risk Factors/Patient Goals at Discharge (Final Review):   Goals and Risk Factor Review - 06/15/24 0903       Core Components/Risk Factors/Patient Goals Review   Personal Goals Review Improve shortness of breath with ADL's;Develop more efficient breathing techniques such as purse lipped breathing and diaphragmatic breathing and practicing self-pacing with activity.    Review Monthly review of patients Core Components/Risk Factors/Patient Goals are as follows: Goal progressing for improving shortness of breath. [name] is currently exercising on RA to keep sats > 88%. He is exercising on the recumbant elliptical and the  recumbant bike. Goal progressing for developing more efficient breathing techniques such as purse lipped breathing and diaphragmatic breathing; and practicing self-pacing with activity. We will continue to monitor Bob's progress throughout the program.    Expected Outcomes To improve shortness of breath with ADL's and develop more efficient breathing techniques such as purse lipped breathing and diaphragmatic breathing; and practicing self-pacing with activity.          ITP Comments:Pt is making expected progress toward Pulmonary Rehab goals after completing 5 session(s). Recommend continued exercise, life style modification, education, and utilization  of breathing techniques to increase stamina and strength, while also decreasing shortness of breath with exertion.  Dr. Slater Staff is Medical Director for Pulmonary Rehab at [facility].     Comments:      [1]  Current Outpatient Medications:    albuterol  (VENTOLIN  HFA) 108 (90 Base) MCG/ACT inhaler, Inhale 2 puffs into the lungs every 6 (six) hours as needed for wheezing or shortness of breath., Disp: 8 g, Rfl: 3   Artificial Saliva (ACT DRY MOUTH) LOZG, 1 tablet Mouth/Throat 3-4 times day as needed, Disp: , Rfl:    aspirin  EC 81 MG tablet, Take 81 mg by mouth daily. Swallow whole., Disp: , Rfl:    azithromycin  (ZITHROMAX ) 250 MG tablet, Five hundred milligrams azithromycin  on day 1 followed by 250 mg daily azithromycin  x 4 days for total 5 days, Disp: 6 tablet, Rfl: 0   Budeson-Glycopyrrol-Formoterol (BREZTRI  AEROSPHERE) 160-9-4.8 MCG/ACT AERO, Inhale 2 puffs into the lungs in the morning and at bedtime., Disp: 3 each, Rfl: 3   celecoxib (CELEBREX) 200 MG capsule, Take 200 mg by mouth daily., Disp: , Rfl:    cetirizine (ZYRTEC) 10 MG chewable tablet, Chew by mouth., Disp: , Rfl:    Docusate Sodium  (DSS) 100 MG CAPS, Take by mouth., Disp: , Rfl:    donepezil  (ARICEPT ) 10 MG tablet, Take half tablet (5 mg) daily for 2 weeks, then increase to the full tablet at 10 mg daily (Patient not taking: Reported on 05/30/2024), Disp: 90 tablet, Rfl: 3   doxycycline  (VIBRA -TABS) 100 MG tablet, Take 1 tablet (100 mg total) by mouth 2 (two) times daily. (Patient not taking: Reported on 05/30/2024), Disp: 20 tablet, Rfl: 0   eszopiclone (LUNESTA) 2 MG TABS tablet, Take 2 mg by mouth at bedtime as needed for sleep. Take immediately before bedtime, Disp: , Rfl:    famotidine  (PEPCID ) 20 MG tablet, Take 1 tablet (20 mg total) by mouth at bedtime., Disp: 30 tablet, Rfl: 3   levothyroxine (SYNTHROID) 50 MCG tablet, 2 tablets Orally Once a day; Duration: 90 days, Disp: , Rfl:    Magnesium  200 MG TABS,  Magnesium  gluconate (Patient taking differently: Take 500 mg by mouth 1 day or 1 dose. Magnesium  gluconate), Disp: , Rfl:    methylphenidate  (RITALIN ) 10 MG tablet, 1 tablet on empty stomach (Patient taking differently: Take 10 mg by mouth 3 (three) times daily with meals.), Disp: , Rfl:    metoprolol  succinate (TOPROL -XL) 25 MG 24 hr tablet, Take 1 tablet (25 mg total) by mouth daily. (Patient taking differently: Take 25 mg by mouth daily. 12.5mg  daily), Disp: 90 tablet, Rfl: 3   mirtazapine (REMERON) 30 MG tablet, Take 30 mg by mouth at bedtime., Disp: , Rfl:    Multiple Vitamin (MULTIVITAMIN) tablet, Take 1 tablet by mouth daily., Disp: , Rfl:    pantoprazole  (PROTONIX ) 40 MG tablet, Take 40 mg by mouth 2 (two) times daily., Disp: ,  Rfl:    polyethylene glycol (MIRALAX  / GLYCOLAX ) packet, Take 17 g by mouth daily as needed (for constipation.). , Disp: , Rfl:    predniSONE  (DELTASONE ) 10 MG tablet, Please take Take prednisone  40mg  once daily x 3 days, then 30mg  once daily x 3 days, then 20mg  once daily x 3 days, then prednisone  10mg  once daily  x 3 days and then pred 15mg  dialy x 3 days and  stop, Disp: 31 tablet, Rfl: 0   predniSONE  (DELTASONE ) 10 MG tablet, Please take prednisone  40 mg x1 day, then 30 mg x1 day, then 20 mg x1 day, then 10 mg x1 day, and then 5 mg x1 day and stop, Disp: 20 tablet, Rfl: 0   Probiotic Product (PROBIOTIC DAILY) CAPS, Pre/Probiotic Orally once a day, Disp: , Rfl:    rosuvastatin  (CRESTOR ) 10 MG tablet, TAKE 1 TABLET(10 MG) BY MOUTH DAILY, Disp: 90 tablet, Rfl: 3   sertraline (ZOLOFT) 100 MG tablet, Take by mouth., Disp: , Rfl:    tamsulosin  (FLOMAX ) 0.4 MG CAPS capsule, Take 0.4 mg by mouth daily after breakfast. , Disp: , Rfl:  [2]  Social History Tobacco Use  Smoking Status Former   Current packs/day: 0.00   Average packs/day: 1.5 packs/day for 24.0 years (36.0 ttl pk-yrs)   Types: Cigarettes   Start date: 79   Quit date: 07/05/1985   Years since quitting:  39.0   Passive exposure: Never  Smokeless Tobacco Never

## 2024-06-29 ENCOUNTER — Other Ambulatory Visit: Payer: Self-pay | Admitting: Family Medicine

## 2024-06-29 DIAGNOSIS — J984 Other disorders of lung: Secondary | ICD-10-CM

## 2024-07-03 ENCOUNTER — Encounter (HOSPITAL_COMMUNITY)
Admission: RE | Admit: 2024-07-03 | Discharge: 2024-07-03 | Disposition: A | Discharge status: Home / Self Care | Source: Ambulatory Visit | Attending: Pulmonary Disease | Admitting: Pulmonary Disease

## 2024-07-03 ENCOUNTER — Encounter (HOSPITAL_COMMUNITY)

## 2024-07-03 VITALS — Wt 170.9 lb

## 2024-07-03 DIAGNOSIS — J984 Other disorders of lung: Secondary | ICD-10-CM | POA: Diagnosis not present

## 2024-07-03 NOTE — Progress Notes (Signed)
 Daily Session Note  Patient Details  Name: [name] MRN: [id number] Date of Birth: [birth year] Referring Provider:   Conrad Ports Pulmonary Rehab Walk Test from 05/30/2024 in [facility] for Heart, Vascular, & Lung Health  Referring Provider Olalere    Encounter Date: 07/03/2024  Check In:  Session Check In - 07/03/24 9182       Check-In   Supervising physician immediately available to respond to emergencies [facility] MD immediately available    Physician(s) Orren Fabry, PA    Location [facility]    Staff Present Ronal Levin, RN, Maud Moats, MS, ACSM-CEP, Exercise Physiologist;Chieko Neises Midge BS, ACSM-CEP, Exercise Physiologist    Virtual Visit No    Medication changes reported     No    Fall or balance concerns reported    Yes    Comments states he is unstead at times, no recent falls    Tobacco Cessation No Change    Warm-up and Cool-down Performed as group-led instruction    Resistance Training Performed Yes    VAD Patient? No    PAD/SET Patient? No      Pain Assessment   Currently in Pain? No/denies    Multiple Pain Sites No          Capillary Blood Glucose: No results found for this or any previous visit (from the past 24 hours).    Tobacco Use History[1]  Goals Met:  Independence with exercise equipment Exercise tolerated well No report of concerns or symptoms today Strength training completed today  Goals Unmet:  Not Applicable  Comments: Service time is from 0812 to 0930.    Dr. Slater Staff is Medical Director for Pulmonary Rehab at [facility].     [1]  Social History Tobacco Use  Smoking Status Former   Current packs/day: 0.00   Average packs/day: 1.5 packs/day for 24.0 years (36.0 ttl pk-yrs)   Types: Cigarettes   Start date: 30   Quit date: 07/05/1985   Years since quitting: 39.0   Passive exposure: Never  Smokeless Tobacco Never

## 2024-07-10 ENCOUNTER — Ambulatory Visit: Admitting: Podiatry

## 2024-07-11 ENCOUNTER — Encounter: Payer: Self-pay | Admitting: Podiatry

## 2024-07-11 ENCOUNTER — Ambulatory Visit: Admitting: Podiatry

## 2024-07-11 DIAGNOSIS — M79675 Pain in left toe(s): Secondary | ICD-10-CM | POA: Diagnosis not present

## 2024-07-11 DIAGNOSIS — B351 Tinea unguium: Secondary | ICD-10-CM

## 2024-07-11 DIAGNOSIS — M79674 Pain in right toe(s): Secondary | ICD-10-CM | POA: Diagnosis not present

## 2024-07-11 DIAGNOSIS — F03918 Unspecified dementia, unspecified severity, with other behavioral disturbance: Secondary | ICD-10-CM | POA: Diagnosis not present

## 2024-07-11 NOTE — Progress Notes (Signed)

## 2024-07-12 ENCOUNTER — Encounter (HOSPITAL_COMMUNITY)
Admission: RE | Admit: 2024-07-12 | Discharge: 2024-07-12 | Disposition: A | Discharge status: Home / Self Care | Source: Ambulatory Visit | Attending: Pulmonary Disease | Admitting: Pulmonary Disease

## 2024-07-12 ENCOUNTER — Encounter (HOSPITAL_COMMUNITY)

## 2024-07-12 DIAGNOSIS — J984 Other disorders of lung: Secondary | ICD-10-CM | POA: Insufficient documentation

## 2024-07-12 NOTE — Progress Notes (Signed)
 Daily Session Note  Patient Details  Name: [name] MRN: [id number] Date of Birth: 1951/01/06 Referring Provider:   Conrad Ports Pulmonary Rehab Walk Test from 05/30/2024 in [facility] for Heart, Vascular, & Lung Health  Referring Provider Olalere    Encounter Date: 07/12/2024  Check In:  Session Check In - 07/12/24 9187       Check-In   Supervising physician immediately available to respond to emergencies [facility] MD immediately available    Physician(s) Damien Braver, NP    Location [facility]    Staff Present Ronal Levin, RN, BSN;Sobia Karger BS, ACSM-CEP, Exercise Physiologist;Casey Claudene, RT    Virtual Visit No    Medication changes reported     No    Fall or balance concerns reported    Yes    Comments states he is unstead at times, no recent falls    Tobacco Cessation No Change    Warm-up and Cool-down Performed as group-led instruction    Resistance Training Performed Yes    VAD Patient? No    PAD/SET Patient? No      Pain Assessment   Currently in Pain? No/denies    Multiple Pain Sites No          Capillary Blood Glucose: No results found for this or any previous visit (from the past 24 hours).    Tobacco Use History[1]  Goals Met:  Independence with exercise equipment Exercise tolerated well No report of concerns or symptoms today Strength training completed today  Goals Unmet:  Not Applicable  Comments: Service time is from 0803 to 0930.    Dr. Slater Staff is Medical Director for Pulmonary Rehab at [facility].     [1]  Social History Tobacco Use  Smoking Status Former   Current packs/day: 0.00   Average packs/day: 1.5 packs/day for 24.0 years (36.0 ttl pk-yrs)   Types: Cigarettes   Start date: 28   Quit date: 07/05/1985   Years since quitting: 39.0   Passive exposure: Never  Smokeless Tobacco Never

## 2024-07-16 ENCOUNTER — Ambulatory Visit
Admission: RE | Admit: 2024-07-16 | Discharge: 2024-07-16 | Disposition: A | Source: Ambulatory Visit | Attending: Family Medicine

## 2024-07-16 DIAGNOSIS — J984 Other disorders of lung: Secondary | ICD-10-CM

## 2024-07-17 ENCOUNTER — Encounter (HOSPITAL_COMMUNITY)

## 2024-07-17 ENCOUNTER — Encounter (HOSPITAL_COMMUNITY)
Admission: RE | Admit: 2024-07-17 | Discharge: 2024-07-17 | Disposition: A | Source: Ambulatory Visit | Attending: Pulmonary Disease

## 2024-07-17 DIAGNOSIS — J984 Other disorders of lung: Secondary | ICD-10-CM | POA: Diagnosis not present

## 2024-07-17 NOTE — Progress Notes (Signed)
 Daily Session Note  Patient Details  Name: [name] MRN: [id number] Date of Birth: 01-04-1951 Referring Provider:   Conrad Ports Pulmonary Rehab Walk Test from 05/30/2024 in [facility] for Heart, Vascular, & Lung Health  Referring Provider Olalere    Encounter Date: 07/17/2024  Check In:  Session Check In - 07/17/24 9187       Check-In   Supervising physician immediately available to respond to emergencies [facility] immediately available    Physician(s) Rosaline Bane, NP    Location [facility]    Staff Present Augustin Sharps, Candia Levin, RN, Maud Moats, MS, ACSM-CEP, Exercise Physiologist    Virtual Visit No    Medication changes reported     No    Fall or balance concerns reported    Yes    Comments states he is unstead at times, no recent falls    Tobacco Cessation No Change    Warm-up and Cool-down Performed as group-led instruction    Resistance Training Performed Yes    VAD Patient? No    PAD/SET Patient? No      Pain Assessment   Currently in Pain? No/denies    Multiple Pain Sites No          Capillary Blood Glucose: No results found for this or any previous visit (from the past 24 hours).    Tobacco Use History[1]  Goals Met:  Proper associated with RPD/PD & O2 Sat Exercise tolerated well No report of concerns or symptoms today Strength training completed today  Goals Unmet:  Not Applicable  Comments: Service time is from 0806 to 0936.    Dr. Slater Staff is Medical Director for Pulmonary Rehab at [facility].     [1]  Social History Tobacco Use  Smoking Status Former   Current packs/day: 0.00   Average packs/day: 1.5 packs/day for 24.0 years (36.0 ttl pk-yrs)   Types: Cigarettes   Start date: 32   Quit date: 07/05/1985   Years since quitting: 39.0   Passive exposure: Never  Smokeless Tobacco Never

## 2024-07-19 ENCOUNTER — Encounter (HOSPITAL_COMMUNITY)
Admission: RE | Admit: 2024-07-19 | Discharge: 2024-07-19 | Disposition: A | Source: Ambulatory Visit | Attending: Pulmonary Disease

## 2024-07-19 ENCOUNTER — Encounter (HOSPITAL_COMMUNITY)

## 2024-07-19 DIAGNOSIS — J984 Other disorders of lung: Secondary | ICD-10-CM

## 2024-07-19 NOTE — Progress Notes (Signed)
 Daily Session Note  Patient Details  Name: [name] MRN: [id number] Date of Birth: 1951-03-29 Referring Provider:   Conrad Ports Pulmonary Rehab Walk Test from 05/30/2024 in [facility] for Heart, Vascular, & Lung Health  Referring Provider Olalere    Encounter Date: 07/19/2024  Check In:  Session Check In - 07/19/24 0818       Check-In   Supervising physician immediately available to respond to emergencies [facility] immediately available    Physician(s) Lum Louis, NP    [facility]    Staff Present Augustin Sharps, Candia Levin, RN, Maud Moats, MS, ACSM-CEP, Exercise Physiologist    Virtual Visit No    Medication changes reported     No    Fall or balance concerns reported    Yes    Comments states he is unstead at times, no recent falls    Tobacco Cessation No Change    Warm-up and Cool-down Performed as group-led instruction    Resistance Training Performed Yes    VAD Patient? No    PAD/SET Patient? No      Pain Assessment   Currently in Pain? No/denies    Multiple Pain Sites No          Capillary Blood Glucose: No results found for this or any previous visit (from the past 24 hours).    Tobacco Use History[1]  Goals Met:  Proper associated with RPD/PD & O2 Sat Independence with exercise equipment Exercise tolerated well No report of concerns or symptoms today  Goals Unmet:  Not Applicable  Comments: Service time is from 0808 to 0910. Pt had to leave early today due to an appt.    Dr. Slater Staff is Medical Director for Pulmonary Rehab at [facility].     [1]  Social History Tobacco Use  Smoking Status Former   Current packs/day: 0.00   Average packs/day: 1.5 packs/day for 24.0 years (36.0 ttl pk-yrs)   Types: Cigarettes   Start date: 3   Quit date: 07/05/1985   Years since quitting: 39.0   Passive exposure: Never  Smokeless Tobacco Never

## 2024-07-23 ENCOUNTER — Other Ambulatory Visit: Payer: Self-pay | Admitting: *Deleted

## 2024-07-23 ENCOUNTER — Other Ambulatory Visit

## 2024-07-23 DIAGNOSIS — R5382 Chronic fatigue, unspecified: Secondary | ICD-10-CM

## 2024-07-23 DIAGNOSIS — J439 Emphysema, unspecified: Secondary | ICD-10-CM

## 2024-07-23 DIAGNOSIS — R7689 Other specified abnormal immunological findings in serum: Secondary | ICD-10-CM

## 2024-07-23 DIAGNOSIS — R634 Abnormal weight loss: Secondary | ICD-10-CM

## 2024-07-23 DIAGNOSIS — J432 Centrilobular emphysema: Secondary | ICD-10-CM

## 2024-07-23 DIAGNOSIS — J479 Bronchiectasis, uncomplicated: Secondary | ICD-10-CM

## 2024-07-23 DIAGNOSIS — J4489 Other specified chronic obstructive pulmonary disease: Secondary | ICD-10-CM

## 2024-07-23 DIAGNOSIS — R058 Other specified cough: Secondary | ICD-10-CM | POA: Diagnosis not present

## 2024-07-23 LAB — CBC WITH DIFFERENTIAL/PLATELET
Basophils Absolute: 0 K/uL (ref 0.0–0.1)
Basophils Relative: 0.3 % (ref 0.0–3.0)
Eosinophils Absolute: 0.1 K/uL (ref 0.0–0.7)
Eosinophils Relative: 2.2 % (ref 0.0–5.0)
HCT: 41.8 % (ref 39.0–52.0)
Hemoglobin: 14.3 g/dL (ref 13.0–17.0)
Lymphocytes Relative: 29.6 % (ref 12.0–46.0)
Lymphs Abs: 1.4 K/uL (ref 0.7–4.0)
MCHC: 34.3 g/dL (ref 30.0–36.0)
MCV: 91.8 fl (ref 78.0–100.0)
Monocytes Absolute: 0.4 K/uL (ref 0.1–1.0)
Monocytes Relative: 8.8 % (ref 3.0–12.0)
Neutro Abs: 2.8 K/uL (ref 1.4–7.7)
Neutrophils Relative %: 59.1 % (ref 43.0–77.0)
Platelets: 138 K/uL — ABNORMAL LOW (ref 150.0–400.0)
RBC: 4.55 Mil/uL (ref 4.22–5.81)
RDW: 13.6 % (ref 11.5–15.5)
WBC: 4.8 K/uL (ref 4.0–10.5)

## 2024-07-23 NOTE — Progress Notes (Signed)
 cb

## 2024-07-24 ENCOUNTER — Encounter (HOSPITAL_COMMUNITY)
Admission: RE | Admit: 2024-07-24 | Discharge: 2024-07-24 | Disposition: A | Source: Ambulatory Visit | Attending: Pulmonary Disease

## 2024-07-24 ENCOUNTER — Encounter (HOSPITAL_COMMUNITY)

## 2024-07-24 VITALS — Wt 171.5 lb

## 2024-07-24 DIAGNOSIS — J984 Other disorders of lung: Secondary | ICD-10-CM

## 2024-07-24 LAB — SEDIMENTATION RATE: Sed Rate: 7 mm/h (ref 0–30)

## 2024-07-24 NOTE — Progress Notes (Signed)
 Daily Session Note  Patient Details  Name: [name] MRN: [id number] Date of Birth: [birth year] Referring Provider:   Conrad Ports Pulmonary Rehab Walk Test from 05/30/2024 in [facility] for Heart, Vascular, & Lung Health  Referring Provider Olalere    Encounter Date: 07/24/2024  Check In:  Session Check In - 07/24/24 9188       Check-In   Supervising physician immediately available to respond to emergencies [facility] MD immediately available    Physician(s) Lum Louis, NP    Location [facility]    Staff Present Augustin Sharps, Candia Levin, RN, Maud Moats, MS, ACSM-CEP, Exercise Physiologist    Virtual Visit No    Medication changes reported     No    Fall or balance concerns reported    Yes    Comments states he is unstead at times, no recent falls    Tobacco Cessation No Change    Warm-up and Cool-down Performed as group-led instruction    Resistance Training Performed Yes    VAD Patient? No    PAD/SET Patient? No      Pain Assessment   Currently in Pain? No/denies    Multiple Pain Sites No          Capillary Blood Glucose: Results for orders placed or performed in visit on 07/23/24 (from the past 24 hours)  Sedimentation rate     Status: None   Collection Time: 07/23/24 10:22 AM  Result Value Ref Range   Sed Rate 7 0 - 30 mm/hr   Narrative   Performed at:  [address] [employer] [address] [place], [place]  [id number] Lab Director: Frankey Sas MD, Phone:  [phone]  CBC w/Diff     Status: Abnormal   Collection Time: 07/23/24 10:54 AM  Result Value Ref Range   WBC 4.8 4.0 - 10.5 K/uL   RBC 4.55 4.22 - 5.81 Mil/uL   Hemoglobin 14.3 13.0 - 17.0 g/dL   HCT 58.1 60.9 - 47.9 %   MCV 91.8 78.0 - 100.0 fl   MCHC 34.3 30.0 - 36.0 g/dL   RDW 86.3 88.4 - 84.4 %   Platelets 138.0 (L) 150.0 - 400.0 K/uL   Neutrophils Relative % 59.1 43.0 - 77.0 %   Lymphocytes Relative 29.6 12.0 - 46.0 %   Monocytes  Relative 8.8 3.0 - 12.0 %   Eosinophils Relative 2.2 0.0 - 5.0 %   Basophils Relative 0.3 0.0 - 3.0 %   Neutro Abs 2.8 1.4 - 7.7 K/uL   Lymphs Abs 1.4 0.7 - 4.0 K/uL   Monocytes Absolute 0.4 0.1 - 1.0 K/uL   Eosinophils Absolute 0.1 0.0 - 0.7 K/uL   Basophils Absolute 0.0 0.0 - 0.1 K/uL     Exercise Prescription Changes - 07/24/24 0800       Response to Exercise   Blood Pressure (Admit) 112/62    Blood Pressure (Exercise) 114/60    Blood Pressure (Exit) 94/58    Heart Rate (Admit) 82 bpm    Heart Rate (Exercise) 100 bpm    Heart Rate (Exit) 93 bpm    Oxygen  Saturation (Admit) 98 %    Oxygen  Saturation (Exercise) 98 %    Oxygen  Saturation (Exit) 96 %    Rating of Perceived Exertion (Exercise) 13    Perceived Dyspnea (Exercise) 2    Duration Continue with 30 min of aerobic exercise without signs/symptoms of physical distress.    Intensity THRR unchanged  Progression   Progression Continue to progress workloads to maintain intensity without signs/symptoms of physical distress.      Resistance Training   Weight Blue bands    Reps 10-15    Time 10 Minutes      Recumbant Bike   Level 3    RPM 70    Watts 36    Minutes 15    METs 3.2      Recumbant Elliptical   Level 4    Minutes 15    METs 4.3          Tobacco Use History[1]  Goals Met:  Proper associated with RPD/PD & O2 Sat Independence with exercise equipment Exercise tolerated well No report of concerns or symptoms today Strength training completed today  Goals Unmet:  Not Applicable  Comments: Service time is from 0803 to 0921.    Dr. Slater Staff is Medical Director for Pulmonary Rehab at [facility].     [1]  Social History Tobacco Use  Smoking Status Former   Current packs/day: 0.00   Average packs/day: 1.5 packs/day for 24.0 years (36.0 ttl pk-yrs)   Types: Cigarettes   Start date: 58   Quit date: 07/05/1985   Years since quitting: 39.0   Passive exposure: Never   Smokeless Tobacco Never

## 2024-07-25 NOTE — Progress Notes (Signed)
 Pulmonary Individual Treatment Plan  Patient Details  Name: [name] MRN: [id number] Date of Birth: November 01, 1950 Referring Provider:   Conrad Ports Pulmonary Rehab Walk Test from 05/30/2024 in [facility]  Referring Provider Olalere    Initial Encounter Date:  Flowsheet Row Pulmonary Rehab Walk Test from 05/30/2024 in [facility] for Heart, Vascular, & Lung Health  Date 05/30/24    Visit Diagnosis: Restrictive lung disease  Patient's Home Medications on Admission:  Current Medications[1]  Past Medical History: Past Medical History:  Diagnosis Date   Anxiety 1988   Arthritis    Bronchitis    history of   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 last stress test 2003   Pneumonia 2009   Primary immune deficiency disorder    Tumor cells, benign    lung, left side   Unstable angina (HCC) 10/28/2017    Tobacco Use: Tobacco Use History[2]  Labs: Review Flowsheet  More data exists      Latest Ref Rng & Units 10/21/2015 10/28/2017 10/29/2017 02/13/2018 12/29/2018  Labs for ITP Cardiac and Pulmonary Rehab  Cholestrol 100 - 199 mg/dL 810  - 871  865  -  LDL (calc) 0 - 99 mg/dL 875  - 76  66  -  HDL-C >39 mg/dL 43  - 38  41  -  Trlycerides 0 - 149 mg/dL 889  - 69  862  -  Hemoglobin A1c 4.8 - 5.6 % 5.9  5.4  - - -  TCO2 22 - 32 mmol/L - - - - 30     Capillary Blood Glucose: Lab Results  Component Value Date   GLUCAP 152 (H) 12/29/2018   GLUCAP 136  (H) 03/15/2016   GLUCAP 102 (H) 09/05/2015   GLUCAP 84 09/05/2015   GLUCAP 121 (H) 09/04/2015     Pulmonary Assessment Scores:  Pulmonary Assessment Scores     Row Name 05/30/24 1056         ADL UCSD   ADL Phase Entry     SOB Score total 31       CAT Score   CAT Score 16       mMRC Score   mMRC Score 4       UCSD: Self-administered rating of dyspnea associated with activities of daily living (ADLs) 6-point scale (0 = not at all to 5 = maximal or unable to do because of breathlessness)  Scoring Scores range from 0 to 120.  Minimally important difference is 5 units  CAT: CAT can identify the health impairment of COPD patients and is better correlated with disease progression.  CAT has a scoring range of zero to 40. The CAT score is classified into four groups of low (less than 10), medium (10 - 20), high (21-30) and very high (31-40) based on the impact level of disease on health status. A CAT score over  10 suggests significant symptoms.  A worsening CAT score could be explained by an exacerbation, poor medication adherence, poor inhaler technique, or progression of COPD or comorbid conditions.  CAT MCID is 2 points  mMRC: mMRC (Modified Medical Research Council) Dyspnea Scale is used to assess the degree of baseline functional disability in patients of respiratory disease due to dyspnea. No minimal important difference is established. A decrease in score of 1 point or greater is considered a positive change.   Pulmonary Function Assessment:  Pulmonary Function Assessment - 05/30/24 1056       Breath   Bilateral Breath Sounds Clear    Shortness of Breath Yes;Limiting activity          Exercise Target Goals: Exercise Program Goal: Individual exercise prescription set using results from initial 6 min walk test and THRR while considering  patients activity barriers and safety.   Exercise Prescription Goal: Initial exercise prescription builds to 30-45 minutes a  day of aerobic activity, 2-3 days per week.  Home exercise guidelines will be given to patient during program as part of exercise prescription that the participant will acknowledge.  Activity Barriers & Risk Stratification:  Activity Barriers & Cardiac Risk Stratification - 05/30/24 1055       Activity Barriers & Cardiac Risk Stratification   Activity Barriers Arthritis;Deconditioning;Left Hip Replacement;Shortness of Breath;Muscular Weakness;Balance Concerns          6 Minute Walk:  6 Minute Walk     Row Name 05/30/24 1145         6 Minute Walk   Phase Initial     Distance 1137 feet     Walk Time 6 minutes     # of Rest Breaks 0     MPH 2.15     METS 1.6     RPE 13     Perceived Dyspnea  2     VO2 Peak 5.62     Symptoms No     Resting HR 69 bpm     Resting BP 92/58     Resting Oxygen  Saturation  100 %     Exercise Oxygen  Saturation  during 6 min walk 96 %     Max Ex. HR 77 bpm     Max Ex. BP 102/58     2 Minute Post BP 98/60       Interval HR   1 Minute HR 74     2 Minute HR 77     3 Minute HR 73     4 Minute HR 71     5 Minute HR 72     6 Minute HR 73     2 Minute Post HR 57     Interval Heart Rate? Yes       Interval Oxygen    Interval Oxygen ? Yes     Baseline Oxygen  Saturation % 100 %     1 Minute Oxygen  Saturation % 98 %     1 Minute Liters of Oxygen  0 L     2 Minute Oxygen  Saturation % 98 %     2 Minute Liters of Oxygen  0 L     3 Minute Oxygen  Saturation % 98 %     3 Minute Liters of Oxygen  0 L     4 Minute Oxygen  Saturation % 96 %     4 Minute Liters of Oxygen  0 L     5 Minute Oxygen  Saturation % 96 %     5 Minute Liters  of Oxygen  0 L     6 Minute Oxygen  Saturation % 97 %     6 Minute Liters of Oxygen  0 L     2 Minute Post Oxygen  Saturation % 99 %     2 Minute Post Liters of Oxygen  0 L        Oxygen  Initial Assessment:  Oxygen  Initial Assessment - 05/30/24 1056       Home Oxygen    Home Oxygen  Device None    Sleep Oxygen  Prescription  None    Home Exercise Oxygen  Prescription None    Home Resting Oxygen  Prescription None      Initial 6 min Walk   Oxygen  Used None      Program Oxygen  Prescription   Program Oxygen  Prescription None      Intervention   Short Term Goals To learn and understand importance of maintaining oxygen  saturations>88%;To learn and understand importance of monitoring SPO2 with pulse oximeter and demonstrate accurate use of the pulse oximeter.;To learn and demonstrate proper pursed lip breathing techniques or other breathing techniques.     Long  Term Goals Maintenance of O2 saturations>88%;Verbalizes importance of monitoring SPO2 with pulse oximeter and return demonstration;Exhibits proper breathing techniques, such as pursed lip breathing or other method taught during program session          Oxygen  Re-Evaluation:  Oxygen  Re-Evaluation     Row Name 06/14/24 1624 07/16/24 0741           Program Oxygen  Prescription   Program Oxygen  Prescription None None        Home Oxygen    Home Oxygen  Device None None      Sleep Oxygen  Prescription None None      Home Exercise Oxygen  Prescription None None      Home Resting Oxygen  Prescription None None        Goals/Expected Outcomes   Short Term Goals To learn and understand importance of maintaining oxygen  saturations>88%;To learn and understand importance of monitoring SPO2 with pulse oximeter and demonstrate accurate use of the pulse oximeter.;To learn and demonstrate proper pursed lip breathing techniques or other breathing techniques.  To learn and understand importance of maintaining oxygen  saturations>88%;To learn and understand importance of monitoring SPO2 with pulse oximeter and demonstrate accurate use of the pulse oximeter.;To learn and demonstrate proper pursed lip breathing techniques or other breathing techniques.       Long  Term Goals Maintenance of O2 saturations>88%;Verbalizes importance of monitoring SPO2 with pulse oximeter and return  demonstration;Exhibits proper breathing techniques, such as pursed lip breathing or other method taught during program session Maintenance of O2 saturations>88%;Verbalizes importance of monitoring SPO2 with pulse oximeter and return demonstration;Exhibits proper breathing techniques, such as pursed lip breathing or other method taught during program session      Goals/Expected Outcomes Compliance and understanding of oxygen  saturation monitoring and breathing techniques to decrease shortness of breath. Compliance and understanding of oxygen  saturation monitoring and breathing techniques to decrease shortness of breath.         Oxygen  Discharge (Final Oxygen  Re-Evaluation):  Oxygen  Re-Evaluation - 07/16/24 0741       Program Oxygen  Prescription   Program Oxygen  Prescription None      Home Oxygen    Home Oxygen  Device None    Sleep Oxygen  Prescription None    Home Exercise Oxygen  Prescription None    Home Resting Oxygen  Prescription None      Goals/Expected Outcomes   Short Term Goals To learn and understand importance of maintaining  oxygen  saturations>88%;To learn and understand importance of monitoring SPO2 with pulse oximeter and demonstrate accurate use of the pulse oximeter.;To learn and demonstrate proper pursed lip breathing techniques or other breathing techniques.     Long  Term Goals Maintenance of O2 saturations>88%;Verbalizes importance of monitoring SPO2 with pulse oximeter and return demonstration;Exhibits proper breathing techniques, such as pursed lip breathing or other method taught during program session    Goals/Expected Outcomes Compliance and understanding of oxygen  saturation monitoring and breathing techniques to decrease shortness of breath.          Initial Exercise Prescription:  Initial Exercise Prescription - 05/30/24 1100       Date of Initial Exercise RX and Referring Provider   Date 05/30/24    Referring Provider Olalere    Expected Discharge Date  09/04/24      Recumbant Bike   Level 1    RPM 80    Watts 90    Minutes 15    METs 2.5      Recumbant Elliptical   Level 1    RPM 70    Watts 80    Minutes 15    METs 3      Prescription Details   Frequency (times per week) 2    Duration Progress to 30 minutes of continuous aerobic without signs/symptoms of physical distress      Intensity   THRR 40-80% of Max Heartrate 58-117    Ratings of Perceived Exertion 11-13    Perceived Dyspnea 0-4      Progression   Progression Continue to progress workloads to maintain intensity without signs/symptoms of physical distress.      Resistance Training   Training Prescription Yes    Weight Blue bands    Reps 10-15          Perform Capillary Blood Glucose checks as needed.  Exercise Prescription Changes:   Exercise Prescription Changes     Row Name 06/07/24 1145 06/21/24 0827 07/03/24 0819 07/24/24 0800       Response to Exercise   Blood Pressure (Admit) 98/50 90/54 100/58 112/62    Blood Pressure (Exercise) -- -- -- 114/60    Blood Pressure (Exit) 92/50 98/54 94/60  94/58    Heart Rate (Admit) 80 bpm 73 bpm 76 bpm 82 bpm    Heart Rate (Exercise) 100 bpm 86 bpm 99 bpm 100 bpm    Heart Rate (Exit) 95 bpm 91 bpm 95 bpm 93 bpm    Oxygen  Saturation (Admit) 97 % 99 % 96 % 98 %    Oxygen  Saturation (Exercise) 94 % 99 % 99 % 98 %    Oxygen  Saturation (Exit) 95 % 96 % 96 % 96 %    Rating of Perceived Exertion (Exercise) 13 13 12 13     Perceived Dyspnea (Exercise) 2 1 1 2     Duration Continue with 30 min of aerobic exercise without signs/symptoms of physical distress. Continue with 30 min of aerobic exercise without signs/symptoms of physical distress. Continue with 30 min of aerobic exercise without signs/symptoms of physical distress. Continue with 30 min of aerobic exercise without signs/symptoms of physical distress.    Intensity THRR unchanged THRR unchanged THRR unchanged THRR unchanged      Progression   Progression  Continue to progress workloads to maintain intensity without signs/symptoms of physical distress. Continue to progress workloads to maintain intensity without signs/symptoms of physical distress. Continue to progress workloads to maintain intensity without signs/symptoms of physical distress. Continue to progress  workloads to maintain intensity without signs/symptoms of physical distress.      Resistance Training   Training Prescription Yes -- -- --    Weight Blue bands Blue bands Blue bands Blue bands    Reps 10-15 10-15 10-15 10-15    Time 10 Minutes 10 Minutes 10 Minutes 10 Minutes      Recumbant Bike   Level 1 3 3 3     RPM -- -- -- 70    Watts -- -- -- 36    Minutes 15 15 15 15     METs 1.9 2.8 3 3.2      Recumbant Elliptical   Level 2 2 2 4     RPM -- -- 65 --    Minutes 15 15 15 15     METs 3 3 3.3 4.3       Exercise Comments:   Exercise Comments     Row Name 06/05/24 0917           Exercise Comments Pt completed first day of exercise. Bob exercised for 15 min on the Octane and recumbent bike. He averages 2.9 METs on the Octane and 1.9 on the recumbent bike. He performed the warmup and cooldown standing holding onto a chair for balance. Discussed METs with good reception.          Exercise Goals and Review:   Exercise Goals     Row Name 05/30/24 1055             Exercise Goals   Increase Physical Activity Yes       Intervention Provide advice, education, support and counseling about physical activity/exercise needs.;Develop an individualized exercise prescription for aerobic and resistive training based on initial evaluation findings, risk stratification, comorbidities and participant's personal goals.       Expected Outcomes Short Term: Attend rehab on a regular basis to increase amount of physical activity.;Long Term: Exercising regularly at least 3-5 days a week.;Long Term: Add in home exercise to make exercise part of routine and to increase amount of physical  activity.       Increase Strength and Stamina Yes       Intervention Provide advice, education, support and counseling about physical activity/exercise needs.;Develop an individualized exercise prescription for aerobic and resistive training based on initial evaluation findings, risk stratification, comorbidities and participant's personal goals.       Expected Outcomes Short Term: Increase workloads from initial exercise prescription for resistance, speed, and METs.;Short Term: Perform resistance training exercises routinely during rehab and add in resistance training at home;Long Term: Improve cardiorespiratory fitness, muscular endurance and strength as measured by increased METs and functional capacity ( )       Able to understand and use rate of perceived exertion (RPE) scale Yes       Intervention Provide education and explanation on how to use RPE scale       Expected Outcomes Short Term: Able to use RPE daily in rehab to express subjective intensity level;Long Term:  Able to use RPE to guide intensity level when exercising independently       Able to understand and use Dyspnea scale Yes       Intervention Provide education and explanation on how to use Dyspnea scale       Expected Outcomes Short Term: Able to use Dyspnea scale daily in rehab to express subjective sense of shortness of breath during exertion;Long Term: Able to use Dyspnea scale to guide intensity level when exercising independently  Knowledge and understanding of Target Heart Rate Range (THRR) Yes       Intervention Provide education and explanation of THRR including how the numbers were predicted and where they are located for reference       Expected Outcomes Short Term: Able to state/look up THRR;Long Term: Able to use THRR to govern intensity when exercising independently;Short Term: Able to use daily as guideline for intensity in rehab       Understanding of Exercise Prescription Yes       Intervention Provide  education, explanation, and written materials on patient's individual exercise prescription       Expected Outcomes Short Term: Able to explain program exercise prescription;Long Term: Able to explain home exercise prescription to exercise independently          Exercise Goals Re-Evaluation :  Exercise Goals Re-Evaluation     Row Name 06/14/24 1622 07/16/24 0740           Exercise Goal Re-Evaluation   Exercise Goals Review Increase Physical Activity;Able to understand and use Dyspnea scale;Understanding of Exercise Prescription;Increase Strength and Stamina;Knowledge and understanding of Target Heart Rate Range (THRR);Able to understand and use rate of perceived exertion (RPE) scale Increase Physical Activity;Able to understand and use Dyspnea scale;Understanding of Exercise Prescription;Increase Strength and Stamina;Knowledge and understanding of Target Heart Rate Range (THRR);Able to understand and use rate of perceived exertion (RPE) scale      Comments Octaviano has completed 3 exercise sessions. He exercises for 15 min on the Octane and recumbent bike. He averages 3.3 METs at level 2 on the Octane and 1.9 METs at level 1 on the Nustep. It is too soon to notate any discernable progressions. Will continue to monitor and progress as able. Octaviano has completed 7 exercise sessions. He exercises for 15 min on the Octane and recumbent bike. He averages 4.4 METs at level 3 on the Octane and 3.4 METs at level 3 on the Nustep.He performs the warmup and cooldown standing holding onto a chair for balance. He has increased his level for the Octane and Nustep as METs have increased. Will continue to monitor and progress as able.      Expected Outcomes Through exercise at rehab and at home, the patient will decrease shortness of breath with daily activities and feel confident in carrying out an exercise regimen at home. Through exercise at rehab and at home, the patient will decrease shortness of breath with daily  activities and feel confident in carrying out an exercise regimen at home.         Discharge Exercise Prescription (Final Exercise Prescription Changes):  Exercise Prescription Changes - 07/24/24 0800       Response to Exercise   Blood Pressure (Admit) 112/62    Blood Pressure (Exercise) 114/60    Blood Pressure (Exit) 94/58    Heart Rate (Admit) 82 bpm    Heart Rate (Exercise) 100 bpm    Heart Rate (Exit) 93 bpm    Oxygen  Saturation (Admit) 98 %    Oxygen  Saturation (Exercise) 98 %    Oxygen  Saturation (Exit) 96 %    Rating of Perceived Exertion (Exercise) 13    Perceived Dyspnea (Exercise) 2    Duration Continue with 30 min of aerobic exercise without signs/symptoms of physical distress.    Intensity THRR unchanged      Progression   Progression Continue to progress workloads to maintain intensity without signs/symptoms of physical distress.      Resistance Training  Weight Blue bands    Reps 10-15    Time 10 Minutes      Recumbant Bike   Level 3    RPM 70    Watts 36    Minutes 15    METs 3.2      Recumbant Elliptical   Level 4    Minutes 15    METs 4.3          Nutrition:  Target Goals: Understanding of nutrition guidelines, daily intake of sodium 1500mg , cholesterol 200mg , calories 30% from fat and 7% or less from saturated fats, daily to have 5 or more servings of fruits and vegetables.  Biometrics:    Nutrition Therapy Plan and Nutrition Goals:  Nutrition Therapy & Goals - 06/05/24 0847       Nutrition Therapy   Diet Regular diet    Drug/Food Interactions Statins/Certain Fruits      Personal Nutrition Goals   Nutrition Goal Patient to identify strategies for weight maintenance/healthy weight gain of 0.5-2# per week.    Comments Patient with medical history significant for CAD, CVA, emphysema, GERD, hypothyroidism. Reports weight loss of 15 lb over past 10 months which he associates with thrombocytopenia. Recent wt gain of 3 lb noted over past  month. Current BMI within appropriate range for >= 65 years. Patient endorses eating 3 meals daily which include Raisin Bran with skim milk, peanut butter and jelly sandwich with yogurt, and beef/Brunswick stew. Snacks include ice cream with strawberries. Supplements diet with Premier shake X 2 daily between meals. Pt reports some early satiety. May promote caloric intake by switching to higher calorie oral nutrition supplement such as Ensure Plus. RD encouraged pt to include high protein foods with meals/snacks. Pt reports general stomach discomfort; may benefit from small, frequent meals to avoid exacerbation.      Intervention Plan   Intervention Prescribe, educate and counsel regarding individualized specific dietary modifications aiming towards targeted core components such as weight, hypertension, lipid management, diabetes, heart failure and other comorbidities.;Nutrition handout(s) given to patient.   Handouts: Weight Gain and Weight Maintenance, Lean Protein for COPD   Expected Outcomes Short Term Goal: Understand basic principles of dietary content, such as calories, fat, sodium, cholesterol and nutrients.;Long Term Goal: Adherence to prescribed nutrition plan.          Nutrition Assessments:  MEDIFICTS Score Key: >=70 Need to make dietary changes  40-70 Heart Healthy Diet <= 40 Therapeutic Level Cholesterol Diet  Flowsheet Row PULMONARY REHAB OTHER RESPIRATORY from 06/07/2024 in [facility] for Heart, Vascular, & Lung Health  Picture Your Plate Total Score on Admission 74   Picture Your Plate Scores: <59 Unhealthy dietary pattern with much room for improvement. 41-50 Dietary pattern unlikely to meet recommendations for good health and room for improvement. 51-60 More healthful dietary pattern, with some room for improvement.  >60 Healthy dietary pattern, although there may be some specific behaviors that could be improved.    Nutrition Goals  Re-Evaluation:  Nutrition Goals Re-Evaluation     Row Name 07/03/24 0928             Goals   Current Weight 170 lb (77.1 kg)       Nutrition Goal Patient to identify strategies for weight maintenance/healthy weight gain of 0.5-2# per week.       Comment Recent wt gain of ~ 1 lb over past two weeks       Expected Outcome Goal in progress. Patient with medical  history significant for CAD, CVA, emphysema, GERD, hypothyroidism. Current BMI within appropriate range for >= 65 years. Patient continues to eat 3 meals daily which include Raisin Bran with skim milk, peanut butter and jelly sandwich with yogurt, and beef/Brunswick stew. Supplements diet with Premier shake X 1-2 daily between meals. Pt verbalizes awareness regarding strategies to help maintain/promote healthy weight gain.          Nutrition Goals Discharge (Final Nutrition Goals Re-Evaluation):  Nutrition Goals Re-Evaluation - 07/03/24 0928       Goals   Current Weight 170 lb (77.1 kg)    Nutrition Goal Patient to identify strategies for weight maintenance/healthy weight gain of 0.5-2# per week.    Comment Recent wt gain of ~ 1 lb over past two weeks    Expected Outcome Goal in progress. Patient with medical history significant for CAD, CVA, emphysema, GERD, hypothyroidism. Current BMI within appropriate range for >= 65 years. Patient continues to eat 3 meals daily which include Raisin Bran with skim milk, peanut butter and jelly sandwich with yogurt, and beef/Brunswick stew. Supplements diet with Premier shake X 1-2 daily between meals. Pt verbalizes awareness regarding strategies to help maintain/promote healthy weight gain.          Psychosocial: Target Goals: Acknowledge presence or absence of significant depression and/or stress, maximize coping skills, provide positive support system. Participant is able to verbalize types and ability to use techniques and skills needed for reducing stress and depression.  Initial Review  & Psychosocial Screening:  Initial Psych Review & Screening - 05/30/24 1101       Initial Review   Current issues with History of Depression;Current Psychotropic Meds      Family Dynamics   Good Support System? Yes    Comments Support from wife and children/family, denies any additional education, resources or referrals at this time      Barriers   Psychosocial barriers to participate in program There are no identifiable barriers or psychosocial needs.      Screening Interventions   Interventions Encouraged to exercise;Provide feedback about the scores to participant    Expected Outcomes Short Term goal: Utilizing psychosocial counselor, staff and physician to assist with identification of specific Stressors or current issues interfering with healing process. Setting desired goal for each stressor or current issue identified.;Long Term Goal: Stressors or current issues are controlled or eliminated.;Short Term goal: Identification and review with participant of any Quality of Life or Depression concerns found by scoring the questionnaire.;Long Term goal: The participant improves quality of Life and PHQ9 Scores as seen by post scores and/or verbalization of changes          Quality of Life Scores:  Scores of 19 and below usually indicate a poorer quality of life in these areas.  A difference of  2-3 points is a clinically meaningful difference.  A difference of 2-3 points in the total score of the Quality of Life Index has been associated with significant improvement in overall quality of life, self-image, physical symptoms, and general health in studies assessing change in quality of life.  PHQ-9: Review Flowsheet  More data may exist      05/30/2024 03/26/2024 12/29/2021 10/05/2021 12/12/2019  Depression screen PHQ 2/9  Decreased Interest 0 0 0 0 0  Down, Depressed, Hopeless 0 0 0 0 0  PHQ - 2 Score 0 0 0 0 0  Altered sleeping 2 - 2 0 3  Tired, decreased energy 3 - 0 1 3  Change  in  appetite 2 - 1 0 1  Feeling bad or failure about yourself  0 - 0 0 0  Trouble concentrating 0 - 0 0 0  Moving slowly or fidgety/restless 0 - 0 0 0  Suicidal thoughts 0 - 0 0 0  PHQ-9 Score 7 - 3  1  -  Difficult doing work/chores Not difficult at all - Not difficult at all Not difficult at all Not difficult at all    Details       Data saved with a previous flowsheet row definition        Interpretation of Total Score  Total Score Depression Severity:  1-4 = Minimal depression, 5-9 = Mild depression, 10-14 = Moderate depression, 15-19 = Moderately severe depression, 20-27 = Severe depression   Psychosocial Evaluation and Intervention:  Psychosocial Evaluation - 05/30/24 1102       Psychosocial Evaluation & Interventions   Interventions Encouraged to exercise with the program and follow exercise prescription    Comments Pt scored high on PHQ2/9, scores 0/7. Pt states he has a history of ADHD and depression but states it is and has been well managed for many years. Pt is on medication and sees his therapist regularly. He states his wife and he having been watching their 7 grandchildren for the last 18 years. He stated the youngest one is 75 yrs old and is looking forward for him to attend school so he will get a break from babysitting. He also stated his wife is in a wheelchair and he is the primary caregiver for her. He denies caregiver fatigue. We will continue to assess and monitor his needs throughout the program.    Expected Outcomes For Octaviano to attend Pulm Rehab without any psy/soc barriers or concerns. For his history of depression to stay well managed.    Continue Psychosocial Services  Follow up required by staff          Psychosocial Re-Evaluation:  Psychosocial Re-Evaluation     Row Name 06/15/24 [phone] 07/13/24 9072           Psychosocial Re-Evaluation   Current issues with History of Depression;Current Psychotropic Meds History of Depression;Current Psychotropic Meds       Comments Octaviano has attended 3 sessions so far. He denies any new psy/soc barriers or concerns at this time. He continues to be compliant with taking his psychotropic meds and continues to work with his mental health therapist. 30 day psy/soc re-eval as follows: Octaviano continues to deny any new psy/soc barriers or concerns at this time. He continues to be compliant with taking his psychotropic meds and continues to work with his mental health therapist. No needs at this time.      Expected Outcomes For Octaviano to continue to attend PR without any psychosocial concerns or issues. For Octaviano to continue to attend PR without any psychosocial concerns or issues.      Interventions Encouraged to attend Pulmonary Rehabilitation for the exercise Encouraged to attend Pulmonary Rehabilitation for the exercise      Continue Psychosocial Services  Follow up required by staff Follow up required by staff         Psychosocial Discharge (Final Psychosocial Re-Evaluation):  Psychosocial Re-Evaluation - 07/13/24 0927       Psychosocial Re-Evaluation   Current issues with History of Depression;Current Psychotropic Meds    Comments 30 day psy/soc re-eval as follows: Octaviano continues to deny any new psy/soc barriers or concerns at this time. He continues to  be compliant with taking his psychotropic meds and continues to work with his mental health therapist. No needs at this time.    Expected Outcomes For Octaviano to continue to attend PR without any psychosocial concerns or issues.    Interventions Encouraged to attend Pulmonary Rehabilitation for the exercise    Continue Psychosocial Services  Follow up required by staff          Education: Education Goals: Education classes will be provided on a weekly basis, covering required topics. Participant will state understanding/return demonstration of topics presented.  Learning Barriers/Preferences:   Education Topics: Know Your Numbers Group instruction that is supported  by a PowerPoint presentation. Instructor discusses importance of knowing and understanding resting, exercise, and post-exercise oxygen  saturation, heart rate, and blood pressure. Oxygen  saturation, heart rate, blood pressure, rating of perceived exertion, and dyspnea are reviewed along with a normal range for these values.  Flowsheet Row PULMONARY REHAB OTHER RESPIRATORY from 06/21/2024 in [facility]  Date 06/21/24  Educator EP  Instruction Review Code 1- Verbalizes Understanding    Exercise for the Pulmonary Patient Group instruction that is supported by a PowerPoint presentation. Instructor discusses benefits of exercise, core components of exercise, frequency, duration, and intensity of an exercise routine, importance of utilizing pulse oximetry during exercise, safety while exercising, and options of places to exercise outside of rehab.  Flowsheet Row PULMONARY REHAB OTHER RESPIRATORY from 06/14/2024 in [facility] for Heart, Vascular, & Lung Health  Date 06/14/24  Educator EP  Instruction Review Code 1- Verbalizes Understanding    MET Level  Group instruction provided by PowerPoint, verbal discussion, and written material to support subject matter. Instructor reviews what METs are and how to increase METs.    Pulmonary Medications Verbally interactive group education provided by instructor with focus on inhaled medications and proper administration. Flowsheet Row PULMONARY REHAB OTHER RESPIRATORY from 06/07/2024 in [facility] for Heart, Vascular, & Lung Health  Date 06/07/24  Educator RT  Instruction Review Code 1- Verbalizes Understanding    Anatomy and Physiology of the Respiratory System Group instruction provided by PowerPoint, verbal discussion, and written material to support subject matter. Instructor reviews respiratory cycle and anatomical components of the respiratory  system and their functions. Instructor also reviews differences in obstructive and restrictive respiratory diseases with examples of each.    Oxygen  Safety Group instruction provided by PowerPoint, verbal discussion, and written material to support subject matter. There is an overview of What is Oxygen  and Why do we need it.  Instructor also reviews how to create a safe environment for oxygen  use, the importance of using oxygen  as prescribed, and the risks of noncompliance. There is a brief discussion on traveling with oxygen  and resources the patient may utilize. Flowsheet Row PULMONARY REHAB OTHER RESPIRATORY from 07/12/2024 in [facility] for Heart, Vascular, & Lung Health  Date 07/12/24  Educator RN  Instruction Review Code 1- Verbalizes Understanding    Oxygen  Use Group instruction provided by PowerPoint, verbal discussion, and written material to discuss how supplemental oxygen  is prescribed and different types of oxygen  supply systems. Resources for more information are provided.  Flowsheet Row PULMONARY REHAB OTHER RESPIRATORY from 07/19/2024 in [facility] for Heart, Vascular, & Lung Health  Date 07/19/24  Educator RT  Instruction Review Code 1- Verbalizes Understanding    Breathing Techniques Group instruction that is supported by demonstration and  informational handouts. Instructor discusses the benefits of pursed lip and diaphragmatic breathing and detailed demonstration on how to perform both.     Risk Factor Reduction Group instruction that is supported by a PowerPoint presentation. Instructor discusses the definition of a risk factor, different risk factors for pulmonary disease, and how the heart and lungs work together.   Pulmonary Diseases Group instruction provided by PowerPoint, verbal discussion, and written material to support subject matter. Instructor gives an overview of the different type of pulmonary diseases.  There is also a discussion on risk factors and symptoms as well as ways to manage the diseases.   Stress and Energy Conservation Group instruction provided by PowerPoint, verbal discussion, and written material to support subject matter. Instructor gives an overview of stress and the impact it can have on the body. Instructor also reviews ways to reduce stress. There is also a discussion on energy conservation and ways to conserve energy throughout the day.   Warning Signs and Symptoms Group instruction provided by PowerPoint, verbal discussion, and written material to support subject matter. Instructor reviews warning signs and symptoms of stroke, heart attack, cold and flu. Instructor also reviews ways to prevent the spread of infection.   Other Education Group or individual verbal, written, or video instructions that support the educational goals of the pulmonary rehab program. Flowsheet Row PULMONARY REHAB OTHER RESPIRATORY from 12/24/2021 in [facility] for Heart, Vascular, & Lung Health  Date 12/03/21  [facility] Levels]  Educator Johnnie  [Handout oxygen  use]  Instruction Review Code 1- Verbalizes Understanding     Knowledge Questionnaire Score:  Knowledge Questionnaire Score - 05/30/24 1059       Knowledge Questionnaire Score   Pre Score 17/18          Core Components/Risk Factors/Patient Goals at Admission:  Personal Goals and Risk Factors at Admission - 05/30/24 1108       Core Components/Risk Factors/Patient Goals on Admission    Weight Management Weight Maintenance;Yes (P)     Intervention Weight Management: Develop a combined nutrition and exercise program designed to reach desired caloric intake, while maintaining appropriate intake of nutrient and fiber, sodium and fats, and appropriate energy expenditure required for the weight goal.;Weight Management: Provide education and appropriate resources to help participant work on and attain dietary  goals.;Weight Management/Obesity: Establish reasonable short term and long term weight goals. (P)     Admit Weight 169 lb 12.1 oz (77 kg) (P)           Core Components/Risk Factors/Patient Goals Review:   Goals and Risk Factor Review     Row Name 06/15/24 0903 07/13/24 0929           Core Components/Risk Factors/Patient Goals Review   Personal Goals Review Improve shortness of breath with ADL's;Develop more efficient breathing techniques such as purse lipped breathing and diaphragmatic breathing and practicing self-pacing with activity. Improve shortness of breath with ADL's;Develop more efficient breathing techniques such as purse lipped breathing and diaphragmatic breathing and practicing self-pacing with activity.      Review Monthly review of patients Core Components/Risk Factors/Patient Goals are as follows: Goal progressing for improving shortness of breath. Octaviano is currently exercising on RA to keep sats > 88%. He is exercising on the recumbant elliptical and the recumbant bike. Goal progressing for developing more efficient breathing techniques such as purse lipped breathing and diaphragmatic breathing; and practicing self-pacing with activity. We will continue to monitor Bob's progress throughout the program. Monthly review  of patients Core Components/Risk Factors/Patient Goals are as follows: Goal progressing for improving shortness of breath. Octaviano is currently exercising on RA to keep sats > 88%. He is exercising on the recumbant elliptical and the recumbant bike. He has ben able to increase his workloads and MET's. Goal progressing for developing more efficient breathing techniques such as purse lipped breathing and diaphragmatic breathing; and practicing self-pacing with activity. We will continue to monitor Bob's progress throughout the program.      Expected Outcomes To improve shortness of breath with ADL's and develop more efficient breathing techniques such as purse lipped  breathing and diaphragmatic breathing; and practicing self-pacing with activity. To improve shortness of breath with ADL's and develop more efficient breathing techniques such as purse lipped breathing and diaphragmatic breathing; and practicing self-pacing with activity.         Core Components/Risk Factors/Patient Goals at Discharge (Final Review):   Goals and Risk Factor Review - 07/13/24 0929       Core Components/Risk Factors/Patient Goals Review   Personal Goals Review Improve shortness of breath with ADL's;Develop more efficient breathing techniques such as purse lipped breathing and diaphragmatic breathing and practicing self-pacing with activity.    Review Monthly review of patients Core Components/Risk Factors/Patient Goals are as follows: Goal progressing for improving shortness of breath. Octaviano is currently exercising on RA to keep sats > 88%. He is exercising on the recumbant elliptical and the recumbant bike. He has ben able to increase his workloads and MET's. Goal progressing for developing more efficient breathing techniques such as purse lipped breathing and diaphragmatic breathing; and practicing self-pacing with activity. We will continue to monitor Bob's progress throughout the program.    Expected Outcomes To improve shortness of breath with ADL's and develop more efficient breathing techniques such as purse lipped breathing and diaphragmatic breathing; and practicing self-pacing with activity.          ITP Comments:Pt is making expected progress toward Pulmonary Rehab goals after completing 10 session(s). Recommend continued exercise, life style modification, education, and utilization of breathing techniques to increase stamina and strength, while also decreasing shortness of breath with exertion.  Dr. Slater Staff is Medical Director for Pulmonary Rehab at [facility].          [1]  Current Outpatient Medications:    albuterol  (VENTOLIN  HFA) 108 (90 Base)  MCG/ACT inhaler, Inhale 2 puffs into the lungs every 6 (six) hours as needed for wheezing or shortness of breath., Disp: 8 g, Rfl: 3   Artificial Saliva (ACT DRY MOUTH) LOZG, 1 tablet Mouth/Throat 3-4 times day as needed, Disp: , Rfl:    aspirin  EC 81 MG tablet, Take 81 mg by mouth daily. Swallow whole., Disp: , Rfl:    azithromycin  (ZITHROMAX ) 250 MG tablet, Five hundred milligrams azithromycin  on day 1 followed by 250 mg daily azithromycin  x 4 days for total 5 days, Disp: 6 tablet, Rfl: 0   Budeson-Glycopyrrol-Formoterol (BREZTRI  AEROSPHERE) 160-9-4.8 MCG/ACT AERO, Inhale 2 puffs into the lungs in the morning and at bedtime., Disp: 3 each, Rfl: 3   celecoxib (CELEBREX) 200 MG capsule, Take 200 mg by mouth daily., Disp: , Rfl:    cetirizine (ZYRTEC) 10 MG chewable tablet, Chew by mouth., Disp: , Rfl:    Docusate Sodium  (DSS) 100 MG CAPS, Take by mouth., Disp: , Rfl:    eszopiclone (LUNESTA) 2 MG TABS tablet, Take 2 mg by mouth at bedtime as needed for sleep. Take immediately before bedtime, Disp: , Rfl:  famotidine  (PEPCID ) 20 MG tablet, Take 1 tablet (20 mg total) by mouth at bedtime., Disp: 30 tablet, Rfl: 3   levothyroxine (SYNTHROID) 50 MCG tablet, 2 tablets Orally Once a day; Duration: 90 days, Disp: , Rfl:    Magnesium  200 MG TABS, Magnesium  gluconate (Patient taking differently: Take 500 mg by mouth 1 day or 1 dose. Magnesium  gluconate), Disp: , Rfl:    methylphenidate  (RITALIN ) 10 MG tablet, 1 tablet on empty stomach (Patient taking differently: Take 10 mg by mouth 3 (three) times daily with meals.), Disp: , Rfl:    metoprolol  succinate (TOPROL -XL) 25 MG 24 hr tablet, Take 1 tablet (25 mg total) by mouth daily. (Patient taking differently: Take 25 mg by mouth daily. 12.5mg  daily), Disp: 90 tablet, Rfl: 3   mirtazapine (REMERON) 30 MG tablet, Take 30 mg by mouth at bedtime., Disp: , Rfl:    Multiple Vitamin (MULTIVITAMIN) tablet, Take 1 tablet by mouth daily., Disp: , Rfl:    pantoprazole   (PROTONIX ) 40 MG tablet, Take 40 mg by mouth 2 (two) times daily., Disp: , Rfl:    polyethylene glycol (MIRALAX  / GLYCOLAX ) packet, Take 17 g by mouth daily as needed (for constipation.). , Disp: , Rfl:    predniSONE  (DELTASONE ) 10 MG tablet, Please take Take prednisone  40mg  once daily x 3 days, then 30mg  once daily x 3 days, then 20mg  once daily x 3 days, then prednisone  10mg  once daily  x 3 days and then pred 15mg  dialy x 3 days and  stop, Disp: 31 tablet, Rfl: 0   predniSONE  (DELTASONE ) 10 MG tablet, Please take prednisone  40 mg x1 day, then 30 mg x1 day, then 20 mg x1 day, then 10 mg x1 day, and then 5 mg x1 day and stop, Disp: 20 tablet, Rfl: 0   Probiotic Product (PROBIOTIC DAILY) CAPS, Pre/Probiotic Orally once a day, Disp: , Rfl:    rosuvastatin  (CRESTOR ) 10 MG tablet, TAKE 1 TABLET(10 MG) BY MOUTH DAILY, Disp: 90 tablet, Rfl: 3   sertraline (ZOLOFT) 100 MG tablet, Take by mouth., Disp: , Rfl:    tamsulosin  (FLOMAX ) 0.4 MG CAPS capsule, Take 0.4 mg by mouth daily after breakfast. , Disp: , Rfl:    valACYclovir (VALTREX) 1000 MG tablet, Take 1,000 mg by mouth 2 (two) times daily., Disp: , Rfl:  [2]  Social History Tobacco Use  Smoking Status Former   Current packs/day: 0.00   Average packs/day: 1.5 packs/day for 24.0 years (36.0 ttl pk-yrs)   Types: Cigarettes   Start date: 78   Quit date: 07/05/1985   Years since quitting: 39.0   Passive exposure: Never  Smokeless Tobacco Never

## 2024-07-26 ENCOUNTER — Encounter (HOSPITAL_COMMUNITY)

## 2024-07-26 ENCOUNTER — Encounter (HOSPITAL_COMMUNITY)
Admission: RE | Admit: 2024-07-26 | Discharge: 2024-07-26 | Disposition: A | Discharge status: Home / Self Care | Source: Ambulatory Visit | Attending: Pulmonary Disease | Admitting: Pulmonary Disease

## 2024-07-26 DIAGNOSIS — J984 Other disorders of lung: Secondary | ICD-10-CM | POA: Diagnosis not present

## 2024-07-26 LAB — ALLERGEN PROFILE, PERENNIAL ALLERGEN IGE
Alternaria Alternata IgE: <0.1 kU/L
Aspergillus Fumigatus IgE: 0.26 kU/L — AB
Aureobasidi Pullulans IgE: 0.12 kU/L — AB
Candida Albicans IgE: 0.4 kU/L — AB
Cat Dander IgE: <0.1 kU/L
Chicken Feathers IgE: <0.1 kU/L
Cladosporium Herbarum IgE: 0.29 kU/L — AB
Cow Dander IgE: <0.1 kU/L
D Farinae IgE: <0.1 kU/L
D Pteronyssinus IgE: 0.17 kU/L — AB
Dog Dander IgE: <0.1 kU/L
Duck Feathers IgE: <0.1 kU/L
Goose Feathers IgE: <0.1 kU/L
Mouse Urine IgE: <0.1 kU/L
Mucor Racemosus IgE: <0.1 kU/L
Penicillium Chrysogen IgE: 0.17 kU/L — AB
Phoma Betae IgE: <0.1 kU/L
Setomelanomma Rostrat: 0.23 kU/L — AB
Stemphylium Herbarum IgE: 0.17 kU/L — AB

## 2024-07-27 LAB — MYOSITIS SPECIFIC II ANTIBODIES PANEL
EJ AB: <11 SI
JO-1 AB: <11 SI
MDA-5 AB: <11 SI
MI-2 ALPHA AB: <11 SI
MI-2 BETA AB: <11 SI
NXP-2 AB: <11 SI
OJ AB: <11 SI
PL-12 AB: <11 SI
PL-7 AB: <11 SI
SRP-AB: <11 SI
TIF-1y AB: <11 SI

## 2024-07-27 LAB — QUANTIFERON-TB GOLD PLUS
Mitogen-NIL: 7.69 [IU]/mL
NIL: 0.01 [IU]/mL
QuantiFERON-TB Gold Plus: NEGATIVE
TB1-NIL: 0 [IU]/mL
TB2-NIL: 0 [IU]/mL

## 2024-07-27 LAB — ANTI-NUCLEAR AB-TITER (ANA TITER): ANA Titer 1: 1:40 {titer} — ABNORMAL HIGH

## 2024-07-27 LAB — CYCLIC CITRUL PEPTIDE ANTIBODY, IGG: Cyclic Citrullin Peptide Ab: <16 U

## 2024-07-27 LAB — RHEUMATOID FACTOR: Rheumatoid fact SerPl-aCnc: >650 [IU]/mL — ABNORMAL HIGH

## 2024-07-27 LAB — ANA: Anti Nuclear Antibody (ANA): POSITIVE — AB

## 2024-07-30 ENCOUNTER — Telehealth (HOSPITAL_COMMUNITY): Payer: Self-pay | Admitting: *Deleted

## 2024-07-30 NOTE — Telephone Encounter (Signed)
 LVM that pt's normal 8:15 class will not operate tomorrow. If he feels comfortable driving, he can attend the 10:15 or 1:15 class.  Aliene Aris BS, ACSM-CEP 07/30/2024 2:06 PM

## 2024-07-31 ENCOUNTER — Encounter (HOSPITAL_COMMUNITY)

## 2024-07-31 ENCOUNTER — Ambulatory Visit: Payer: Self-pay | Admitting: Internal Medicine

## 2024-07-31 DIAGNOSIS — Z9109 Other allergy status, other than to drugs and biological substances: Secondary | ICD-10-CM

## 2024-07-31 MED ORDER — AZELASTINE HCL 0.1 % NA SOLN: 2.0000 | Freq: Two times a day (BID) | NASAL | 2 refills | Status: AC

## 2024-07-31 NOTE — Progress Notes (Signed)
 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  Last seen by Dr Wash in 2023.  She advised that if having any joint issues to contact her again.  Please let her know about that.  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  Should focus on reducing dust mites on a pillow and bedding [you can google the information for this].  If you are also doing gardening you should mask.  Also make sure there is no mold or mildew in the house.  Also recommend Astelin  nasal spray which is an antihistamine and some new studies are showing it prevents people from getting COVID.  This just provides extra nasal hygiene and keep the allergen level low and make you feel better.  I welcome you to try it for 2 months.  Sending prescription.

## 2024-08-01 ENCOUNTER — Telehealth (HOSPITAL_COMMUNITY): Payer: Self-pay

## 2024-08-01 NOTE — Telephone Encounter (Signed)
 Patient c/o for 8:15 PR class for 1/29 due to icy roads.

## 2024-08-02 ENCOUNTER — Encounter (HOSPITAL_COMMUNITY)

## 2024-08-06 ENCOUNTER — Telehealth (HOSPITAL_COMMUNITY): Payer: Self-pay

## 2024-08-06 NOTE — Telephone Encounter (Signed)
Called pt about inclement weather and pt wanted to cancel appt.

## 2024-08-07 ENCOUNTER — Encounter (HOSPITAL_COMMUNITY)

## 2024-08-09 ENCOUNTER — Telehealth (HOSPITAL_COMMUNITY): Payer: Self-pay

## 2024-08-09 ENCOUNTER — Encounter (HOSPITAL_COMMUNITY)

## 2024-08-09 NOTE — Telephone Encounter (Signed)
 Patient c/o for 8:15 PR class, left message stating the roads are icy.

## 2024-08-14 ENCOUNTER — Encounter (HOSPITAL_COMMUNITY)

## 2024-08-16 ENCOUNTER — Encounter (HOSPITAL_COMMUNITY)

## 2024-08-21 ENCOUNTER — Encounter (HOSPITAL_COMMUNITY)

## 2024-08-23 ENCOUNTER — Encounter (HOSPITAL_COMMUNITY)

## 2024-08-28 ENCOUNTER — Encounter (HOSPITAL_COMMUNITY)

## 2024-08-30 ENCOUNTER — Encounter (HOSPITAL_COMMUNITY)

## 2024-09-03 ENCOUNTER — Ambulatory Visit: Admitting: Internal Medicine

## 2024-09-04 ENCOUNTER — Encounter (HOSPITAL_COMMUNITY)

## 2024-09-06 ENCOUNTER — Encounter (HOSPITAL_COMMUNITY)

## 2024-09-10 ENCOUNTER — Ambulatory Visit: Admitting: Podiatry

## 2024-09-18 ENCOUNTER — Ambulatory Visit: Admitting: Podiatry

## 2024-10-08 ENCOUNTER — Encounter

## 2024-11-19 ENCOUNTER — Ambulatory Visit: Admitting: Podiatry

## 2024-12-03 ENCOUNTER — Ambulatory Visit: Admitting: Podiatry

## 2025-03-25 ENCOUNTER — Ambulatory Visit: Payer: PRIVATE HEALTH INSURANCE | Admitting: Physician Assistant

## 2025-03-27 ENCOUNTER — Ambulatory Visit: Payer: PRIVATE HEALTH INSURANCE | Admitting: Physician Assistant
